# Patient Record
Sex: Male | Born: 1945
Health system: Southern US, Community
[De-identification: ages and names within clinical notes are randomized; demographics above are authoritative.]

## PROBLEM LIST (undated history)

## (undated) DIAGNOSIS — R413 Other amnesia: Secondary | ICD-10-CM

## (undated) DIAGNOSIS — K529 Noninfective gastroenteritis and colitis, unspecified: Secondary | ICD-10-CM

## (undated) DIAGNOSIS — I639 Cerebral infarction, unspecified: Secondary | ICD-10-CM

## (undated) DIAGNOSIS — N183 Chronic kidney disease, stage 3 unspecified: Secondary | ICD-10-CM

## (undated) DIAGNOSIS — E162 Hypoglycemia, unspecified: Secondary | ICD-10-CM

## (undated) DIAGNOSIS — Z8489 Family history of other specified conditions: Secondary | ICD-10-CM

## (undated) DIAGNOSIS — K635 Polyp of colon: Secondary | ICD-10-CM

## (undated) DIAGNOSIS — J189 Pneumonia, unspecified organism: Secondary | ICD-10-CM

## (undated) DIAGNOSIS — N400 Enlarged prostate without lower urinary tract symptoms: Secondary | ICD-10-CM

## (undated) DIAGNOSIS — I5042 Chronic combined systolic (congestive) and diastolic (congestive) heart failure: Secondary | ICD-10-CM

## (undated) DIAGNOSIS — Z8781 Personal history of (healed) traumatic fracture: Secondary | ICD-10-CM

## (undated) DIAGNOSIS — F329 Major depressive disorder, single episode, unspecified: Secondary | ICD-10-CM

## (undated) DIAGNOSIS — A419 Sepsis, unspecified organism: Secondary | ICD-10-CM

## (undated) DIAGNOSIS — M1990 Unspecified osteoarthritis, unspecified site: Secondary | ICD-10-CM

## (undated) DIAGNOSIS — R569 Unspecified convulsions: Secondary | ICD-10-CM

## (undated) DIAGNOSIS — M869 Osteomyelitis, unspecified: Secondary | ICD-10-CM

## (undated) DIAGNOSIS — R112 Nausea with vomiting, unspecified: Secondary | ICD-10-CM

## (undated) DIAGNOSIS — I739 Peripheral vascular disease, unspecified: Secondary | ICD-10-CM

## (undated) DIAGNOSIS — R55 Syncope and collapse: Secondary | ICD-10-CM

## (undated) DIAGNOSIS — D649 Anemia, unspecified: Secondary | ICD-10-CM

## (undated) DIAGNOSIS — I10 Essential (primary) hypertension: Secondary | ICD-10-CM

## (undated) DIAGNOSIS — R269 Unspecified abnormalities of gait and mobility: Secondary | ICD-10-CM

## (undated) DIAGNOSIS — F32A Depression, unspecified: Secondary | ICD-10-CM

## (undated) HISTORY — PX: BELOW KNEE LEG AMPUTATION: SUR23

## (undated) HISTORY — DX: Other amnesia: R41.3

## (undated) HISTORY — PX: EYE SURGERY: SHX253

## (undated) HISTORY — DX: Unspecified abnormalities of gait and mobility: R26.9

## (undated) HISTORY — PX: COLON SURGERY: SHX602

## (undated) HISTORY — DX: Chronic kidney disease, stage 3 unspecified: N18.30

## (undated) HISTORY — PX: TOE AMPUTATION: SHX809

## (undated) HISTORY — DX: Chronic kidney disease, stage 3 (moderate): N18.3

## (undated) HISTORY — DX: Chronic combined systolic (congestive) and diastolic (congestive) heart failure: I50.42

## (undated) HISTORY — PX: VASCULAR SURGERY: SHX849

## (undated) HISTORY — DX: Personal history of (healed) traumatic fracture: Z87.81

## (undated) HISTORY — PX: COLONOSCOPY: SHX174

---

## 1998-08-31 ENCOUNTER — Ambulatory Visit: Admission: RE | Admit: 1998-08-31 | Discharge: 1998-08-31 | Payer: Self-pay | Admitting: Cardiology

## 1998-10-20 ENCOUNTER — Encounter: Admission: RE | Admit: 1998-10-20 | Discharge: 1999-01-18 | Payer: Self-pay | Admitting: Internal Medicine

## 1999-02-01 ENCOUNTER — Encounter: Admission: RE | Admit: 1999-02-01 | Discharge: 1999-05-02 | Payer: Self-pay | Admitting: Internal Medicine

## 2001-06-28 ENCOUNTER — Emergency Department (HOSPITAL_COMMUNITY): Admission: EM | Admit: 2001-06-28 | Discharge: 2001-06-28 | Payer: Self-pay | Admitting: Emergency Medicine

## 2001-06-28 ENCOUNTER — Encounter: Payer: Self-pay | Admitting: Emergency Medicine

## 2001-07-11 ENCOUNTER — Inpatient Hospital Stay (HOSPITAL_COMMUNITY): Admission: AD | Admit: 2001-07-11 | Discharge: 2001-07-30 | Payer: Self-pay

## 2001-07-30 ENCOUNTER — Inpatient Hospital Stay
Admission: AD | Admit: 2001-07-30 | Discharge: 2001-08-14 | Payer: Self-pay | Admitting: Physical Medicine & Rehabilitation

## 2001-08-05 ENCOUNTER — Encounter: Payer: Self-pay | Admitting: Orthopedic Surgery

## 2001-08-11 ENCOUNTER — Encounter: Payer: Self-pay | Admitting: Physical Medicine & Rehabilitation

## 2001-08-13 ENCOUNTER — Encounter: Payer: Self-pay | Admitting: Physical Medicine & Rehabilitation

## 2001-08-14 ENCOUNTER — Inpatient Hospital Stay (HOSPITAL_COMMUNITY)
Admission: AD | Admit: 2001-08-14 | Discharge: 2001-08-19 | Payer: Self-pay | Admitting: Physical Medicine & Rehabilitation

## 2001-08-21 ENCOUNTER — Encounter
Admission: RE | Admit: 2001-08-21 | Discharge: 2001-10-16 | Payer: Self-pay | Admitting: Physical Medicine & Rehabilitation

## 2003-01-22 ENCOUNTER — Ambulatory Visit (HOSPITAL_COMMUNITY): Admission: RE | Admit: 2003-01-22 | Discharge: 2003-01-22 | Payer: Self-pay | Admitting: Orthopedic Surgery

## 2003-01-27 ENCOUNTER — Encounter (INDEPENDENT_AMBULATORY_CARE_PROVIDER_SITE_OTHER): Payer: Self-pay | Admitting: Specialist

## 2003-01-27 ENCOUNTER — Ambulatory Visit (HOSPITAL_COMMUNITY): Admission: RE | Admit: 2003-01-27 | Discharge: 2003-01-28 | Payer: Self-pay | Admitting: Orthopedic Surgery

## 2003-05-01 ENCOUNTER — Ambulatory Visit (HOSPITAL_BASED_OUTPATIENT_CLINIC_OR_DEPARTMENT_OTHER): Admission: RE | Admit: 2003-05-01 | Discharge: 2003-05-01 | Payer: Self-pay | Admitting: Orthopedic Surgery

## 2003-05-01 ENCOUNTER — Encounter (INDEPENDENT_AMBULATORY_CARE_PROVIDER_SITE_OTHER): Payer: Self-pay | Admitting: *Deleted

## 2003-05-01 ENCOUNTER — Ambulatory Visit (HOSPITAL_COMMUNITY): Admission: RE | Admit: 2003-05-01 | Discharge: 2003-05-01 | Payer: Self-pay | Admitting: Orthopedic Surgery

## 2006-06-23 ENCOUNTER — Emergency Department (HOSPITAL_COMMUNITY): Admission: EM | Admit: 2006-06-23 | Discharge: 2006-06-23 | Payer: Self-pay | Admitting: *Deleted

## 2008-05-12 ENCOUNTER — Emergency Department (HOSPITAL_COMMUNITY): Admission: EM | Admit: 2008-05-12 | Discharge: 2008-05-12 | Payer: Self-pay | Admitting: Emergency Medicine

## 2009-01-14 ENCOUNTER — Encounter: Admission: RE | Admit: 2009-01-14 | Discharge: 2009-03-17 | Payer: Self-pay | Admitting: Endocrinology

## 2009-06-30 ENCOUNTER — Emergency Department (HOSPITAL_COMMUNITY): Admission: EM | Admit: 2009-06-30 | Discharge: 2009-06-30 | Payer: Self-pay | Admitting: Emergency Medicine

## 2010-03-15 ENCOUNTER — Encounter (INDEPENDENT_AMBULATORY_CARE_PROVIDER_SITE_OTHER): Payer: Self-pay | Admitting: General Surgery

## 2010-03-15 ENCOUNTER — Inpatient Hospital Stay (HOSPITAL_COMMUNITY)
Admission: RE | Admit: 2010-03-15 | Discharge: 2010-03-19 | Payer: Self-pay | Source: Home / Self Care | Attending: General Surgery | Admitting: General Surgery

## 2010-05-09 NOTE — Discharge Summary (Signed)
  [name], BALLEN NO.:  [id number]  MEDICAL RECORD NO.:  [id number]           PATIENT TYPE:  LOCATION:                                 FACILITY:  PHYSICIAN:  Gwenyth Ober, M.D.    DATE OF BIRTH:  Feb 15, 1946  DATE OF ADMISSION: DATE OF DISCHARGE:                              DISCHARGE SUMMARY   DISCHARGE DIAGNOSES:  Dysplastic polyp of the cecum.  PRINCIPAL PROCEDURE:  Right colectomy.  The patient had additional diagnosis of non-insulin-dependent diabetes mellitus.  He was discharged home in the care of his family.  Discharge medications include pain medicines that were given to him for postoperative recovery and also his preoperative medications including his medications for his diabetes.  His diet is carbohydrate modified.  BRIEF SUMMARY OF HOSPITAL COURSE:  The patient was admitted on the day of procedure for a right segmental colectomy and cecal resection.  This was for dysplastic polyp noted on preoperative colonoscopy. Postoperatively, he did well and was discharged home on postop day #3. He was on the enteric protocol.  He tolerated his diet well, had no significant problems in the hospital.  Staples were still in place at the time of his discharge.  He had a right transverse incision.  He was discharged home in the care of his family.  Pain medicines were administered appropriately and given to the patient.  He is to see Dr. Hulen Skains in about 7-10 days for staple removal.     Gwenyth Ober, M.D.     JOW/MEDQ  D:  04/27/2010  T:  04/28/2010  Job:  [id number]  Electronically Signed by Judeth Horn M.D. on 05/09/2010 12:44:19 PM

## 2010-05-30 LAB — DIFFERENTIAL
Basophils Absolute: 0 10*3/uL (ref 0.0–0.1)
Basophils Relative: 0 % (ref 0–1)
Basophils Relative: 0 % (ref 0–1)
Eosinophils Absolute: 0 10*3/uL (ref 0.0–0.7)
Eosinophils Absolute: 0.1 10*3/uL (ref 0.0–0.7)
Eosinophils Absolute: 0.2 10*3/uL (ref 0.0–0.7)
Eosinophils Relative: 0 % (ref 0–5)
Eosinophils Relative: 1 % (ref 0–5)
Lymphocytes Relative: 10 % — ABNORMAL LOW (ref 12–46)
Lymphocytes Relative: 8 % — ABNORMAL LOW (ref 12–46)
Lymphs Abs: 0.9 10*3/uL (ref 0.7–4.0)
Monocytes Relative: 13 % — ABNORMAL HIGH (ref 3–12)
Neutrophils Relative %: 73 % (ref 43–77)

## 2010-05-30 LAB — COMPREHENSIVE METABOLIC PANEL
ALT: 18 U/L (ref 0–53)
Alkaline Phosphatase: 111 U/L (ref 39–117)
BUN: 44 mg/dL — ABNORMAL HIGH (ref 6–23)
CO2: 29 mEq/L (ref 19–32)
Calcium: 9.6 mg/dL (ref 8.4–10.5)
GFR calc non Af Amer: 39 mL/min — ABNORMAL LOW (ref >60)
Glucose, Bld: 256 mg/dL — ABNORMAL HIGH (ref 70–99)
Potassium: 6 mEq/L — ABNORMAL HIGH (ref 3.5–5.1)
Sodium: 140 mEq/L (ref 135–145)

## 2010-05-30 LAB — GLUCOSE, CAPILLARY
Glucose-Capillary: 109 mg/dL — ABNORMAL HIGH (ref 70–99)
Glucose-Capillary: 109 mg/dL — ABNORMAL HIGH (ref 70–99)
Glucose-Capillary: 127 mg/dL — ABNORMAL HIGH (ref 70–99)
Glucose-Capillary: 149 mg/dL — ABNORMAL HIGH (ref 70–99)
Glucose-Capillary: 161 mg/dL — ABNORMAL HIGH (ref 70–99)
Glucose-Capillary: 190 mg/dL — ABNORMAL HIGH (ref 70–99)
Glucose-Capillary: 246 mg/dL — ABNORMAL HIGH (ref 70–99)
Glucose-Capillary: 248 mg/dL — ABNORMAL HIGH (ref 70–99)
Glucose-Capillary: 257 mg/dL — ABNORMAL HIGH (ref 70–99)
Glucose-Capillary: 282 mg/dL — ABNORMAL HIGH (ref 70–99)

## 2010-05-30 LAB — SURGICAL PCR SCREEN
MRSA, PCR: NEGATIVE
Staphylococcus aureus: NEGATIVE

## 2010-05-30 LAB — BASIC METABOLIC PANEL
BUN: 17 mg/dL (ref 6–23)
BUN: 26 mg/dL — ABNORMAL HIGH (ref 6–23)
CO2: 23 mEq/L (ref 19–32)
CO2: 25 mEq/L (ref 19–32)
Calcium: 9.2 mg/dL (ref 8.4–10.5)
Chloride: 107 mEq/L (ref 96–112)
Chloride: 107 mEq/L (ref 96–112)
Creatinine, Ser: 1.38 mg/dL (ref 0.4–1.5)
Creatinine, Ser: 1.41 mg/dL (ref 0.4–1.5)
GFR calc Af Amer: >60 mL/min (ref >60)
Glucose, Bld: 93 mg/dL (ref 70–99)
Potassium: 4.3 mEq/L (ref 3.5–5.1)
Sodium: 136 mEq/L (ref 135–145)

## 2010-05-30 LAB — CBC
HCT: 33.4 % — ABNORMAL LOW (ref 39.0–52.0)
Hemoglobin: 10.4 g/dL — ABNORMAL LOW (ref 13.0–17.0)
Hemoglobin: 10.8 g/dL — ABNORMAL LOW (ref 13.0–17.0)
MCH: 29 pg (ref 26.0–34.0)
MCH: 29.1 pg (ref 26.0–34.0)
MCH: 29.4 pg (ref 26.0–34.0)
MCHC: 32.3 g/dL (ref 30.0–36.0)
MCHC: 32.5 g/dL (ref 30.0–36.0)
MCV: 89 fL (ref 78.0–100.0)
MCV: 89.3 fL (ref 78.0–100.0)
MCV: 91 fL (ref 78.0–100.0)
Platelets: 194 10*3/uL (ref 150–400)
Platelets: 233 10*3/uL (ref 150–400)
RBC: 3.54 MIL/uL — ABNORMAL LOW (ref 4.22–5.81)
RDW: 12.3 % (ref 11.5–15.5)
RDW: 12.5 % (ref 11.5–15.5)
WBC: 9.3 10*3/uL (ref 4.0–10.5)

## 2010-05-30 LAB — TYPE AND SCREEN: ABO/RH(D): O POS

## 2010-07-02 ENCOUNTER — Emergency Department (HOSPITAL_COMMUNITY): Payer: Medicare Other

## 2010-07-02 ENCOUNTER — Inpatient Hospital Stay (HOSPITAL_COMMUNITY)
Admission: EM | Admit: 2010-07-02 | Discharge: 2010-07-11 | DRG: 617 | Disposition: A | Discharge status: Home Health | Payer: Medicare Other | Source: Ambulatory Visit | Attending: Internal Medicine | Admitting: Internal Medicine

## 2010-07-02 DIAGNOSIS — I129 Hypertensive chronic kidney disease with stage 1 through stage 4 chronic kidney disease, or unspecified chronic kidney disease: Secondary | ICD-10-CM | POA: Diagnosis present

## 2010-07-02 DIAGNOSIS — S98139A Complete traumatic amputation of one unspecified lesser toe, initial encounter: Secondary | ICD-10-CM

## 2010-07-02 DIAGNOSIS — M869 Osteomyelitis, unspecified: Secondary | ICD-10-CM | POA: Diagnosis present

## 2010-07-02 DIAGNOSIS — Z8673 Personal history of transient ischemic attack (TIA), and cerebral infarction without residual deficits: Secondary | ICD-10-CM

## 2010-07-02 DIAGNOSIS — Z79899 Other long term (current) drug therapy: Secondary | ICD-10-CM

## 2010-07-02 DIAGNOSIS — E1069 Type 1 diabetes mellitus with other specified complication: Principal | ICD-10-CM | POA: Diagnosis present

## 2010-07-02 DIAGNOSIS — E1059 Type 1 diabetes mellitus with other circulatory complications: Secondary | ICD-10-CM | POA: Diagnosis present

## 2010-07-02 DIAGNOSIS — E1049 Type 1 diabetes mellitus with other diabetic neurological complication: Secondary | ICD-10-CM | POA: Diagnosis present

## 2010-07-02 DIAGNOSIS — D649 Anemia, unspecified: Secondary | ICD-10-CM | POA: Diagnosis present

## 2010-07-02 DIAGNOSIS — Z794 Long term (current) use of insulin: Secondary | ICD-10-CM

## 2010-07-02 DIAGNOSIS — G40909 Epilepsy, unspecified, not intractable, without status epilepticus: Secondary | ICD-10-CM | POA: Diagnosis present

## 2010-07-02 DIAGNOSIS — E1142 Type 2 diabetes mellitus with diabetic polyneuropathy: Secondary | ICD-10-CM | POA: Diagnosis present

## 2010-07-02 DIAGNOSIS — Z7982 Long term (current) use of aspirin: Secondary | ICD-10-CM

## 2010-07-02 DIAGNOSIS — N179 Acute kidney failure, unspecified: Secondary | ICD-10-CM | POA: Diagnosis present

## 2010-07-02 DIAGNOSIS — M9080 Osteopathy in diseases classified elsewhere, unspecified site: Secondary | ICD-10-CM | POA: Diagnosis present

## 2010-07-02 DIAGNOSIS — L97509 Non-pressure chronic ulcer of other part of unspecified foot with unspecified severity: Secondary | ICD-10-CM | POA: Diagnosis present

## 2010-07-02 DIAGNOSIS — I96 Gangrene, not elsewhere classified: Secondary | ICD-10-CM | POA: Diagnosis present

## 2010-07-02 DIAGNOSIS — Z880 Allergy status to penicillin: Secondary | ICD-10-CM

## 2010-07-02 DIAGNOSIS — N182 Chronic kidney disease, stage 2 (mild): Secondary | ICD-10-CM | POA: Diagnosis present

## 2010-07-02 DIAGNOSIS — E785 Hyperlipidemia, unspecified: Secondary | ICD-10-CM | POA: Diagnosis present

## 2010-07-02 LAB — DIFFERENTIAL
Basophils Absolute: 0 10*3/uL (ref 0.0–0.1)
Basophils Relative: 1 % (ref 0–1)
Lymphocytes Relative: 11 % — ABNORMAL LOW (ref 12–46)
Neutro Abs: 7 10*3/uL (ref 1.7–7.7)
Neutrophils Relative %: 79 % — ABNORMAL HIGH (ref 43–77)

## 2010-07-02 LAB — BASIC METABOLIC PANEL
CO2: 26 mEq/L (ref 19–32)
Calcium: 8.9 mg/dL (ref 8.4–10.5)
Chloride: 109 mEq/L (ref 96–112)
GFR calc Af Amer: 52 mL/min — ABNORMAL LOW (ref >60)
Potassium: 4.7 mEq/L (ref 3.5–5.1)
Sodium: 139 mEq/L (ref 135–145)

## 2010-07-02 LAB — CBC
HCT: 31.4 % — ABNORMAL LOW (ref 39.0–52.0)
Hemoglobin: 10 g/dL — ABNORMAL LOW (ref 13.0–17.0)
MCHC: 31.8 g/dL (ref 30.0–36.0)
RBC: 3.57 MIL/uL — ABNORMAL LOW (ref 4.22–5.81)
WBC: 8.9 10*3/uL (ref 4.0–10.5)

## 2010-07-03 DIAGNOSIS — M629 Disorder of muscle, unspecified: Secondary | ICD-10-CM

## 2010-07-03 LAB — BASIC METABOLIC PANEL
Chloride: 109 mEq/L (ref 96–112)
GFR calc non Af Amer: 43 mL/min — ABNORMAL LOW (ref >60)
Glucose, Bld: 210 mg/dL — ABNORMAL HIGH (ref 70–99)
Potassium: 4.3 mEq/L (ref 3.5–5.1)
Sodium: 139 mEq/L (ref 135–145)

## 2010-07-03 LAB — CBC
HCT: 27.6 % — ABNORMAL LOW (ref 39.0–52.0)
Hemoglobin: 9 g/dL — ABNORMAL LOW (ref 13.0–17.0)
MCH: 28.8 pg (ref 26.0–34.0)
RBC: 3.13 MIL/uL — ABNORMAL LOW (ref 4.22–5.81)

## 2010-07-03 LAB — SEDIMENTATION RATE: Sed Rate: 53 mm/hr — ABNORMAL HIGH (ref 0–16)

## 2010-07-03 LAB — HEMOGLOBIN A1C: Mean Plasma Glucose: 148 mg/dL — ABNORMAL HIGH (ref <117)

## 2010-07-03 LAB — GLUCOSE, CAPILLARY
Glucose-Capillary: 163 mg/dL — ABNORMAL HIGH (ref 70–99)
Glucose-Capillary: 63 mg/dL — ABNORMAL LOW (ref 70–99)

## 2010-07-04 DIAGNOSIS — I739 Peripheral vascular disease, unspecified: Secondary | ICD-10-CM

## 2010-07-04 LAB — CBC
HCT: 29.8 % — ABNORMAL LOW (ref 39.0–52.0)
Hemoglobin: 9.5 g/dL — ABNORMAL LOW (ref 13.0–17.0)
MCH: 27.9 pg (ref 26.0–34.0)
MCHC: 31.9 g/dL (ref 30.0–36.0)
MCV: 87.6 fL (ref 78.0–100.0)
Platelets: 257 K/uL (ref 150–400)
RBC: 3.4 MIL/uL — ABNORMAL LOW (ref 4.22–5.81)
RDW: 13.2 % (ref 11.5–15.5)
WBC: 9 K/uL (ref 4.0–10.5)

## 2010-07-04 LAB — BASIC METABOLIC PANEL WITH GFR
BUN: 26 mg/dL — ABNORMAL HIGH (ref 6–23)
CO2: 22 meq/L (ref 19–32)
Calcium: 8.7 mg/dL (ref 8.4–10.5)
Chloride: 112 meq/L (ref 96–112)
Creatinine, Ser: 1.53 mg/dL — ABNORMAL HIGH (ref 0.4–1.5)
GFR calc non Af Amer: 46 mL/min — ABNORMAL LOW
Glucose, Bld: 66 mg/dL — ABNORMAL LOW (ref 70–99)
Potassium: 4.1 meq/L (ref 3.5–5.1)
Sodium: 142 meq/L (ref 135–145)

## 2010-07-04 LAB — GLUCOSE, CAPILLARY
Glucose-Capillary: 137 mg/dL — ABNORMAL HIGH (ref 70–99)
Glucose-Capillary: 96 mg/dL (ref 70–99)
Glucose-Capillary: 153 mg/dL — ABNORMAL HIGH (ref 70–99)
Glucose-Capillary: 143 mg/dL — ABNORMAL HIGH (ref 70–99)
Glucose-Capillary: 108 mg/dL — ABNORMAL HIGH (ref 70–99)

## 2010-07-05 DIAGNOSIS — I739 Peripheral vascular disease, unspecified: Secondary | ICD-10-CM

## 2010-07-05 LAB — CBC
HCT: 29.5 % — ABNORMAL LOW (ref 39.0–52.0)
Hemoglobin: 9.7 g/dL — ABNORMAL LOW (ref 13.0–17.0)
MCH: 28.7 pg (ref 26.0–34.0)
MCHC: 32.9 g/dL (ref 30.0–36.0)
MCHC: 33.8 g/dL (ref 30.0–36.0)
MCV: 87.3 fL (ref 78.0–100.0)
Platelets: 180 10*3/uL (ref 150–400)
RBC: 3.38 MIL/uL — ABNORMAL LOW (ref 4.22–5.81)
RDW: 12.5 % (ref 11.5–15.5)

## 2010-07-05 LAB — BASIC METABOLIC PANEL
CO2: 21 mEq/L (ref 19–32)
CO2: 23 mEq/L (ref 19–32)
Calcium: 8.9 mg/dL (ref 8.4–10.5)
Calcium: 8.7 mg/dL (ref 8.4–10.5)
Chloride: 111 mEq/L (ref 96–112)
Creatinine, Ser: 1.42 mg/dL (ref 0.4–1.5)
Creatinine, Ser: 1.26 mg/dL (ref 0.4–1.5)
GFR calc Af Amer: >60 mL/min (ref >60)
GFR calc non Af Amer: 58 mL/min — ABNORMAL LOW (ref >60)
Glucose, Bld: 154 mg/dL — ABNORMAL HIGH (ref 70–99)
Glucose, Bld: 119 mg/dL — ABNORMAL HIGH (ref 70–99)

## 2010-07-05 LAB — PHENYTOIN LEVEL, TOTAL: Phenytoin Lvl: 4.6 ug/mL — ABNORMAL LOW (ref 10.0–20.0)

## 2010-07-05 LAB — URINALYSIS, ROUTINE W REFLEX MICROSCOPIC
Glucose, UA: NEGATIVE mg/dL
Leukocytes, UA: NEGATIVE
Protein, ur: 100 mg/dL — AB
Specific Gravity, Urine: 1.016 (ref 1.005–1.030)
Urobilinogen, UA: 0.2 mg/dL (ref 0.0–1.0)

## 2010-07-05 LAB — GLUCOSE, CAPILLARY
Glucose-Capillary: 161 mg/dL — ABNORMAL HIGH (ref 70–99)
Glucose-Capillary: 253 mg/dL — ABNORMAL HIGH (ref 70–99)

## 2010-07-05 LAB — URINE MICROSCOPIC-ADD ON

## 2010-07-06 LAB — GLUCOSE, CAPILLARY: Glucose-Capillary: 145 mg/dL — ABNORMAL HIGH (ref 70–99)

## 2010-07-06 LAB — BASIC METABOLIC PANEL
BUN: 21 mg/dL (ref 6–23)
Calcium: 8.7 mg/dL (ref 8.4–10.5)
Creatinine, Ser: 1.46 mg/dL (ref 0.4–1.5)
GFR calc non Af Amer: 49 mL/min — ABNORMAL LOW (ref >60)
Glucose, Bld: 152 mg/dL — ABNORMAL HIGH (ref 70–99)

## 2010-07-06 NOTE — Op Note (Signed)
  NAMEDEKLAN, ROZELL                 ACCOUNT NO.:  [id number]  MEDICAL RECORD NO.:  [id number]           PATIENT TYPE:  O  LOCATION:  [id number]                         FACILITY:  [facility]  PHYSICIAN:  Theotis Burrow IV, MDDATE OF BIRTH:  1945-08-28  DATE OF PROCEDURE:  07/05/2010 DATE OF DISCHARGE:                              OPERATIVE REPORT   PREOPERATIVE DIAGNOSIS:  Ischemic left toe.  POSTOPERATIVE DIAGNOSIS:  Ischemic left toe.  PROCEDURE PERFORMED: 1. Ultrasound access, right lower extremity. 2. Abdominal aortogram. 3. Left leg runoff. 4. Second order catheterization.  Mr. Vaughns is a 65 year old gentleman who presented to the hospital with dry gangrene and possible osteomyelitis to his left fourth toe.  He has previously undergone toe amputations on both sides which have healed. He has had duplex ultrasound which showed dampened waveforms.  He has a toe pressure of 80.  He comes in today for arteriogram to assess for possible revascularization options.  PROCEDURE:  The patient was identified in the holding area and taken to room #8, placed supine on the table.  The right groin was prepped and draped in the usual fashion.  A time-out was called.  Right femoral artery was accessed under ultrasound guidance with an 18-gauge needle. An 0.035 wire was advanced into the aorta under fluoroscopic visualization.  A 5-French sheath was placed over the wire.  Omni flush catheter was advanced to the level of L1.  Abdominal aortogram was obtained.  Next, using the Omni flush catheter and Glidewire, the aortic bifurcation was crossed.  Catheter was placed in left external iliac artery and left leg runoff was performed.  FINDINGS:  Aortogram:  Visualized portions of the suprarenal abdominal aorta showed no significant disease.  There are single renal arteries which are widely patent bilaterally.  Bilateral common external and internal iliac arteries are widely patent without  stenosis.  Left lower extremity:  The left common femoral artery is widely patent. The left profunda femoral artery is widely patent.  The left superficial femoral artery is widely patent.  The left popliteal artery is widely patent.  The anterior tibial artery is occluded at its origin.  The peroneal artery occludes in the mid calf.  The posterior tibial artery has 2 areas of focal stenosis above the ankle and then at the level of the ankle.  It tapers down to a very diminutive-sized artery.  The patient has severe disease out onto the foot.  There is reconstitution of the dorsalis pedis.  After the above images were obtained, decision was made to terminate the procedure.  Catheters and wires were removed.  The patient was taken to holding area for sheath pull.  IMPRESSION:  Severe tibial disease extending out onto the foot.  The patient has very poor option for revascularization.     Eldridge Abrahams, MD     VWB/MEDQ  D:  07/05/2010  T:  07/05/2010  Job:  [id number]  Electronically Signed by Orvan Falconer IV MD on 07/06/2010 07:39:23 PM

## 2010-07-06 NOTE — Consult Note (Signed)
  NAMEAVIS, [name]                 ACCOUNT NO.:  [id number]  MEDICAL RECORD NO.:  [id number]           PATIENT TYPE:  O  LOCATION:  [id number]                         FACILITY:  [facility]  PHYSICIAN:  Theotis Burrow IV, MDDATE OF BIRTH:  08/28/1945  DATE OF CONSULTATION:  07/03/2010 DATE OF DISCHARGE:                                CONSULTATION   REASON FOR CONSULT:  dry gangreen to the left toe.  HISTORY:  This is a 65 year old gentleman with a history of hypertension, diabetes, hyperlipidemia, and seizure disorder.  He has previously undergone amputation of the third toe and left second toe amputations by Dr. [name] in the past.  He stated that when prior to his admission, he took his sock off and a large piece of skin came off his fourth toe and this started to bleed.  He states he has been dusky for approximately 1 month.  He also complained of swelling in the left foot and leg.  He has not had any drainage or foul smell or erythema.  He denies fevers.  He does not have a palpable pulse.  Therefore, Vascular consult has been requested.   ROS: as stated above and in admission history and physical for full review on July 02, 2010.  PAST MEDICAL HISTORY:  Hypertension, diabetes, diabetic neuropathy, hyperlipidemia, seizure disorder, and TIA.  SOCIAL HISTORY:  Negative for tobacco or alcohol.  ALLERGIES:  Allergic to CODEINE.  PHYSICAL EXAMINATION:  VITAL SIGNS: AFVSS O2 sats 100% on room air. GENERAL:  He is resting comfortably, in no acute distress. HEENT:  Within normal limits.  Sclerae are anicteric.  Naris without drainage. CARDIOVASCULAR:  Regular rate and rhythm.  He has palpable femoral pulses bilaterally, palpable popliteal pulses bilaterally.  I cannot palpate pedal pulses bilaterally.  He does have the anterior tibial and posterior tibial doppler signals. ABDOMEN:  Soft, nontender. NEUROLOGIC:  He is intact. SKIN:  Without rash in the left toe.  There is no evidence  of dry gangrene.  DIAGNOSTIC STUDIES:  Foot x-ray is suggestive of osteomyelitis white blood cell count 6.8.  ASSESSMENT:  Left toe dry gangrene with possible osteomyelitis on x-ray. I spent approximately 30 minutes discussing this area with the patient and his son who is studying to become a Librarian, academic.  The plan is to obtain arterial Dopplers to see if he has evidence of arterial insufficiency. If he has had Doppler evidence suggestive of arterial insufficiency,  We will need to proceed with angiography with possible revascularization.  I detailed  the angiographic procedure and possible stenting with the patient and his son, including the risks and benefits.  All other questions were answered.  Please consult Dr. [name]  for toe amputation.  I would like to get Dopplers in the morning.     Eldridge Abrahams, MD     VWB/MEDQ  D:  07/03/2010  T:  07/04/2010  Job:  [id number]  Electronically Signed by Orvan Falconer IV MD on 07/06/2010 07:44:07 PM

## 2010-07-07 LAB — GLUCOSE, CAPILLARY: Glucose-Capillary: 154 mg/dL — ABNORMAL HIGH (ref 70–99)

## 2010-07-08 ENCOUNTER — Observation Stay (HOSPITAL_COMMUNITY): Payer: Medicare Other

## 2010-07-08 ENCOUNTER — Other Ambulatory Visit: Payer: Self-pay | Admitting: Orthopedic Surgery

## 2010-07-08 LAB — TYPE AND SCREEN
ABO/RH(D): O POS
Antibody Screen: NEGATIVE

## 2010-07-08 LAB — GLUCOSE, CAPILLARY
Glucose-Capillary: 131 mg/dL — ABNORMAL HIGH (ref 70–99)
Glucose-Capillary: 139 mg/dL — ABNORMAL HIGH (ref 70–99)

## 2010-07-09 LAB — GLUCOSE, CAPILLARY
Glucose-Capillary: 120 mg/dL — ABNORMAL HIGH (ref 70–99)
Glucose-Capillary: 124 mg/dL — ABNORMAL HIGH (ref 70–99)
Glucose-Capillary: 160 mg/dL — ABNORMAL HIGH (ref 70–99)
Glucose-Capillary: 164 mg/dL — ABNORMAL HIGH (ref 70–99)
Glucose-Capillary: 71 mg/dL (ref 70–99)

## 2010-07-09 LAB — CBC
Hemoglobin: 8.9 g/dL — ABNORMAL LOW (ref 13.0–17.0)
MCHC: 32.6 g/dL (ref 30.0–36.0)
RBC: 3.17 MIL/uL — ABNORMAL LOW (ref 4.22–5.81)
WBC: 9.3 10*3/uL (ref 4.0–10.5)

## 2010-07-09 LAB — BASIC METABOLIC PANEL
Chloride: 110 mEq/L (ref 96–112)
Creatinine, Ser: 1.37 mg/dL (ref 0.4–1.5)
GFR calc Af Amer: >60 mL/min (ref >60)

## 2010-07-10 ENCOUNTER — Observation Stay (HOSPITAL_COMMUNITY): Payer: Medicare Other

## 2010-07-10 LAB — GLUCOSE, CAPILLARY
Glucose-Capillary: 154 mg/dL — ABNORMAL HIGH (ref 70–99)
Glucose-Capillary: 128 mg/dL — ABNORMAL HIGH (ref 70–99)
Glucose-Capillary: 173 mg/dL — ABNORMAL HIGH (ref 70–99)
Glucose-Capillary: 74 mg/dL (ref 70–99)

## 2010-07-11 LAB — BASIC METABOLIC PANEL
CO2: 21 mEq/L (ref 19–32)
Calcium: 8.4 mg/dL (ref 8.4–10.5)
Creatinine, Ser: 1.66 mg/dL — ABNORMAL HIGH (ref 0.4–1.5)
GFR calc Af Amer: 51 mL/min — ABNORMAL LOW (ref >60)
GFR calc non Af Amer: 42 mL/min — ABNORMAL LOW (ref >60)
Sodium: 137 mEq/L (ref 135–145)

## 2010-07-11 LAB — GLUCOSE, CAPILLARY: Glucose-Capillary: 189 mg/dL — ABNORMAL HIGH (ref 70–99)

## 2010-07-12 NOTE — Op Note (Signed)
  NAMEYOSVANI, ALLARD                 ACCOUNT NO.:  [id number]  MEDICAL RECORD NO.:  [id number]           PATIENT TYPE:  LOCATION:                                 FACILITY:  PHYSICIAN:  Newt Minion, MD     DATE OF BIRTH:  Jul 26, 1945  DATE OF PROCEDURE:  07/08/2010 DATE OF DISCHARGE:                              OPERATIVE REPORT   PREOPERATIVE DIAGNOSIS:  Abscess, osteomyelitis, gangrene, left foot fourth toe.  POSTOPERATIVE DIAGNOSIS:  Abscess, osteomyelitis, gangrene, left foot fourth toe.  PROCEDURE:  Left foot fourth ray amputation.  SURGEON:  Newt Minion, MD  ANESTHESIA:  General.  ESTIMATED BLOOD LOSS:  Minimal.  ANTIBIOTICS:  Vancomycin and Cipro preoperatively.  DRAINS:  None.  COMPLICATIONS:  None.  TOURNIQUET TIME:  None.  DISPOSITION:  To PACU in stable condition.  INDICATIONS FOR PROCEDURE:  The patient is a 65 year old gentleman with diabetic insensate neuropathy who has undergone angiography with an ABI at 54% on the left ankle with no toe pressure.  He is not felt to be a revascularizations candidate by Dr. Trula Slade.  The patient has gangrene of the left foot fourth toe with osteomyelitis, abscess, and drainage. Due to the patient's non-revascularizable condition for the left lower extremity and the gangrenous necrotic changes of the fourth toe, the patient presents at this time for fourth ray amputation.  Risks and benefits were discussed including infection, neurovascular injury, nonhealing of the wound, potential for higher-level amputation.  The patient states he understands and wished to proceed at this time.  DESCRIPTION OF THE PROCEDURE:  The patient was brought to OR room 10 and underwent a general anesthetic after adequate level of anesthesia obtained, the patient's left lower extremity was prepped using DuraPrep and draped into a sterile field.  A racket incision was made dorsally around the great toe.  The metatarsal was transected  through the base of the metatarsal and the metatarsal and toe were resected in one block of tissue.  There was very minimal amount of petechial bleeding.  The wound was irrigated with normal saline.  There was no abscess, no necrotic tissue, no signs of infection.  The incision was closed using 2-0 nylon with modified vertical mattress suture as well as a far-near near-far suture technique.  The wound closed well without any tension on the skin.  The wound was covered with Adaptic, orthopedic sponges, ABD dressing, Kerlix, and Coban.  A compressive wrap was placed from the tibial tubercle to the metatarsal heads due to the patient's venous stasis insufficiency.  The patient was then extubated, taken to PACU in stable condition.  Plan for physical therapy, nonweightbearing on the left.     Newt Minion, MD     MVD/MEDQ  D:  07/08/2010  T:  07/09/2010  Job:  [id number]  Electronically Signed by Meridee Score MD on 07/12/2010 03:02:02 PM

## 2010-07-14 LAB — GLUCOSE, CAPILLARY: Glucose-Capillary: 206 mg/dL — ABNORMAL HIGH (ref 70–99)

## 2010-07-18 NOTE — Discharge Summary (Signed)
[name], EHRHART NO.:  [id number]  MEDICAL RECORD NO.:  [id number]           PATIENT TYPE:  O  LOCATION:  [id number]                         FACILITY:  [facility]  PHYSICIAN:  Edythe Lynn, M.D.       DATE OF BIRTH:  [birth year]  DATE OF ADMISSION:  07/02/2010 DATE OF DISCHARGE:  07/11/2010                              DISCHARGE SUMMARY   PRIMARY CARE PHYSICIAN:  Ronaldo Miyamoto, MD  ORTHOPEDIC SURGEON:  Newt Minion, MD  CONDITION ON DISCHARGE:  Much improved.  The patient is stable on IV antibiotics and is ready for discharge home with a rolling walker and wheelchair.  Has left foot is still nonweightbearing  DISCHARGE DIAGNOSES: 1. Left foot osteomyelitis, status post amputation of the left 4th toe     this hospitalization. 2. Severe peripheral vascular disease.  Poor candidate for     revascularizations per Dr. Trula Slade. 3. Acute on chronic renal failure, acute renal failure now resolved. 4. Anemia. 5. Type 1 diabetes insulin dependent. 6. Hypertension. 7. Seizure disorder. 8. Hyperlipidemia. 9. History of TIA.  HISTORY OF PRESENT ILLNESS:  [name] is very pleasant 65 year old male with history of type 1 diabetes and peripheral vascular disease, who has previously had a 3rd and 5th left toes amputated.  On admission, the patient stated that he was taking off his sock of his left foot yesterday when a large piece of skin came off of his left 4th toe and it started to bleed.  He mentioned that the toe had been black for approximately 1 month.  He also had swelling in his left foot and leg. There is no redness or pus.  No complaint of fever, chills or sweats, who is admitted to the hospital with a chronic necrotic 4th toe edema in his left foot and leg, acute renal failure, type 1 diabetes poorly controlled, history of anemia and seizure disorder.  HOSPITAL COURSE: 1. Severe peripheral vascular disease and osteomyelitis of his left     foot.  The patient  was admitted, started on IV Cipro and     vancomycin.  He was seen by Dr. Annamarie Major on July 03, 2010.     Dr. Trula Slade ordered vascular Dopplers and subsequently     arteriography.  The patient was found to have severe tibial disease     in his left foot and was found to be a poor candidate for     revascularization.  Consequently, on July 08, 2010, he underwent     amputation of his left 4th toe without any complications.  Since     his surgery has been monitored closely by Orthopedics and Physical     Therapy, he is now able to move about with a rolling walker.  It     has been recommended by Physical Therapy that he go home with a     rolling walker and wheelchair as he is nonweightbearing on the left     and fatigues rather quickly.  With regards to his osteomyelitis to     go home with IV access and  12 days of IV vancomycin and     ciprofloxacin along with Home Health to monitor this. 2. DM type 1.  The patient has been maintained on sliding scale     insulin and how his CBGs have ranged from 74-189 as an outpatient.     The patient will resume his previous insulin regimen of 70/30, 15     units in the morning and 10 units with dinner.  His hemoglobin A1c     on July 03, 2010, was 6.8. 3. Anemia.  The patient was admitted with a hemoglobin of 10 after IV     fluids and at dilution his hemoglobin on July 09, 2010, was 8.9.     There was no workup undertaken as to the cause of his anemia.  This     can be done on an outpatient basis. 4. When the patient was admitted, he was felt to be in acute renal     failure.  He had a BUN of 33, creatinine 1.62.  During the course     of his hospital stay, his creatinine did down to 1.37, it was found     that he probably had underlying chronic kidney disease stage II.     Today, on discharge, his BUN is 25, creatinine 1.66.  He will need     follow up as an outpatient to monitor him for chronic kidney     disease.  The patient's other  comorbidities are hypertension, seizure disorder, hyperlipidemia and history of TIA.  Remained stable and quiet in-house.  PROCEDURES: 1. The patient underwent ultrasound to assess his right lower     extremity, abdominal aortogram with left leg runoff, and second     order catheterization on July 05, 2010, by Dr. Annamarie Major.     Results indicated severe left tibial disease extending out onto the     foot.  The patient has very poor options for revascularization. 2. On July 08, 2010, Dr. Meridee Score performed amputation of his left     4th toe.  There were no complications. 3. On July 10, 2010, the patient had a right PICC line placed.  CONSULTATIONS:  The patient was seen in consultation by Dr. Annamarie Major of Vascular Surgery on July 03, 2010.  Further, the patient was seen by Physical Therapy throughout his hospital stay.  RADIOLOGY:  X-ray of the patient's right tibia and fibula on admission showed no acute fracture.  X-ray of the left foot on July 02, 2010, showed chronic changes of the valgus deformity and second toe amputation sclerosis in the proximal phalanx of the 3rd toe could represent osteomyelitis.  On July 08, 2010, the patient had a chest x-ray that showed borderline cardiomegaly without acute disease and on July 10, 2010, the patient had a chest x-ray that showed his PICC line was overlying the mid right atrium and a 2-cm retraction was recommended.  PERTINENT LABS:  On admission, the patient had a white count 8.9, hemoglobin 10.0, hematocrit 31.4, platelets 254,000.  Sodium 139, potassium 4.7, chloride 109, bicarb 26, glucose 191, BUN 33, creatinine 1.62, serum albumin 8.9, hemoglobin A1c 6.8, mean plasma glucose 148. Today, on discharge, the patient's BMET is as follows:  Sodium 137, potassium 4.5, chloride 110, bicarb 21, glucose 158, BUN 25, creatinine 1.66.  The CBC was last done on July 09, 2010, with a WBC of 9.3, hemoglobin 8.9, hematocrit 27.3,  and platelets 281,000.  PHYSICAL EXAMINATION:  GENERAL:  On  discharge, the patient is alert and oriented, in no apparent distress. VITAL SIGNS:  His temperature is 98.9, pulse 61, respirations 20, blood pressure 159/66.  O2 sats 95% on room air. HEENT:  Head is atraumatic normocephalic.  Eyes are anicteric with pupils are equal, round.  Nose shows no nasal discharge or exterior lesions.  Mouth has moist mucous membranes and good dentition. NECK:  Supple with midline trachea.  No JVD.  No lymphadenopathy. CHEST:  Demonstrates no accessory muscle use.  He has no wheezes or crackles to my auscultation. HEART:  Has regular rate and rhythm without obvious murmurs, rubs or gallops. ABDOMEN:  Soft, slightly distended.  Good bowel sounds.  No hepatomegaly.  No tenderness. EXTREMITIES:  Show no clubbing, cyanosis or edema.  His left lower extremity is wrapped and treated.  I did not undo this wrapping in order to examine it. NEURO:  Cranial nerves II-XII are grossly intact.  He has no facial asymmetries.  No obvious focal neuro deficits. PSYCHIATRIC:  The patient is alert and oriented.  Demeanor is pleasant, cooperative.  Grooming is excellent.  DISCHARGE INSTRUCTIONS:  The patient is to minimize weightbearing on his left foot and to utilize his rolling walker and wheelchair, otherwise, he has no restrictions on his activity.  DIET:  He is to resume his diabetic diet and when his leg heals continue exercising preferably swimming.  WOUND CARE:  Per Dr. [name], he is to keep the dressing dry.  He has a follow up appointment with Dr. [name] in 2 weeks.  He is to call the office for the appointment [phone].  He also has a follow up appointment with his primary care physician Dr. Wilson Singer, this is scheduled for Jul 26, 2010, at 1:45 p.m., office number [phone].  The patient will receive home health RN visits to monitor his PICC line and I gave him his IV ciprofloxacin and vancomycin for the next  12 days.  DISCHARGE MEDICATIONS: 1. Ciprofloxacin 400 mg IV b.i.d. 2. Vancomycin 1000 mg IV b.i.d. for 12 days. 3. Aspirin 325 mg 1 tablet daily by mouth. 4. Benazepril/hydrochlorothiazide 20/12.5 1 tablet daily. 5. Dilantin 100 mg 3 capsules by mouth daily at bedtime. 6. Flomax 0.4 mg 1 tablet daily. 7. Humulin 70/30, 10-15 units subcutaneously twice a day. 8. Hydrocodone and acetaminophen 5/325 one to two tablets by mouth     every 4 hours as needed for pain.  The patient was given a     prescription for 20 tablets with no refills. 9. Lipitor 20 mg 1 tablet by mouth every evening. 10.Metoprolol half tablet by mouth twice daily. 11.[name] ophthalmic 0.004% 1 drop in both eyes daily at bedtime. The patient is to stop taking torsemide 20 mg half tablet by mouth twice daily.  He is also to stop taking metformin until he sees his primary care physician who can assess his renal function and determine whether or not these medications should be restarted.  THINGS TO FOLLOW UP ON IN THE OUTPATIENT SETTING: 1. Dr. [name] will handle orthopedic issues, suggestions per primary     care. 2. The patient's metformin and torsemide were recently discontinued,     please assess his renal function and see if     they needs to be restarted. 3. The patient has anemia with a hemoglobin of 8.9 on discharge.     Please proceed with outpatient workup if needed.     Melton Alar, PA   ______________________________ Edythe Lynn, M.D.  MLY/MEDQ  D:  07/11/2010  T:  07/12/2010  Job:  [id number]  cc:   Ronaldo Miyamoto, M.D. Newt Minion, MD  Electronically Signed by Imogene Burn PA on 07/14/2010 02:41:14 PM Electronically Signed by Edythe Lynn M.D. on 07/18/2010 09:13:24 PM

## 2010-07-27 NOTE — H&P (Signed)
NAME:  HUSSAM, HOWDEN NO.:  [id number]  MEDICAL RECORD NO.:  [id number]           PATIENT TYPE:  LOCATION:                                 FACILITY:  PHYSICIAN:  Debbe Odea, M.D.     DATE OF BIRTH:  09-Dec-1945  DATE OF ADMISSION: DATE OF DISCHARGE:                             HISTORY & PHYSICAL   PRIMARY CARE PHYSICIAN:  Ronaldo Miyamoto, MD  PRESENTING COMPLAINT:  Bleeding from toe.  HISTORY OF PRESENT ILLNESS:  This is a 65 year old male with hypertension, diabetes, diabetic neuropathy, hyperlipidemia, and a seizure disorder.  The patient states that he was taking his sock off his left foot yesterday when a large piece of skin came off his left fourth toe and it started to bleed.  He did not notice any pus, the toe has been black for about one month now.  He has had swelling in his left foot and leg.  No redness and again no drainage or pus.  No complaint of fever, chills, or sweats.  The patient has had prior amputation of his toes performed by Dr. [name].  He has never been assessed for vascular disease.  PAST MEDICAL HISTORY: 1. Hypertension. 2. Diabetes mellitus, insulin requiring. 3. Diabetic neuropathy. 4. Hyperlipidemia. 5. Seizure disorder. 6. TIA x1.  SOCIAL HISTORY:  The patient does not smoke or drink.  He is married, lives with his spouse.  He works part-time as a Retail buyer.  PAST SURGICAL HISTORY:  Amputation of one toe on his left foot and two toes on his right foot in the past.  He has also had right cataract surgery.  He recently had a right-sided colectomy for dysplastic polyp.  ALLERGIES:  CODEINE causes swelling.  PENICILLIN "can kill him," he states he does get short of breath and swelling when he takes PENICILLIN.  HOME MEDICATIONS:  Per med list obtained from the patient: 1. Aspirin 325 mg daily. 2. Benicar/hydrochlorothiazide 20/12.5 daily. 3. Diclofenac sodium 75 mg daily. 4. Hydrocodone/acetaminophen 5/325 p.r.n.  He has not  been taking     this. 5. Metformin 500 mg b.i.d. 6. Metoprolol 50 mg. 7. Phenytoin 100 mg. 8. Tamsulosin 0.4 mg daily. 9. Torsemide 20 mg daily. 10.[name]  0.004% one drop in both eyes at bedtime. 11.Not written on his med list but he admitted to when I questioned     him, the patient states that he does get 70/30 insulin 15 units in     the morning, 10 with dinner.  He is supposed to take regular     insulin when his sugar is high.  REVIEW OF SYSTEMS:  GENERAL:  No recent weight loss or weight gain.  No frequent headaches.  HEENT:  No blurred vision, double vision, sore throat, sinus trouble, or earache.  RESPIRATORY:  No shortness of breath or cough.  CARDIAC:  No chest pain or palpitations.  He has pedal edema of the left foot and leg.  GASTROINTESTINAL:  No nausea, vomiting, diarrhea, or constipation.  No reflux.  GENITOURINARY:  No dysuria, hematuria.  HEMATOLOGIC:  No easy bruising.  SKIN:  No rash. MUSCULOSKELETAL:  Mild arthritis in hands.  NEUROLOGIC:  History of TIA and seizures.  PSYCHOLOGIC:  Wife states he has depression.  ENDOCRINE: The patient does not check his sugars regularly but does not complain of increased thirst or increased frequency of micturition.  PHYSICAL EXAMINATION:  GENERAL:  A middle-aged male sitting up in bed in no acute distress. VITAL SIGNS:  Blood pressure 144/66, pulse 72, respiratory rate 18, temperature 98.3, oxygen saturation 96% on room air. HEENT:  Pupils equal, round, reactive to light.  Extraocular movements are intact.  Conjunctivae are pink.  No scleral icterus.  Oral mucosa is dry.  Normal dentition. NECK:  Supple.  No thyromegaly or lymphadenopathy. HEART:  Regular rate and rhythm.  No murmurs, rubs, or gallops. LUNGS:  Clear bilaterally with normal respiratory effort. ABDOMEN:  Soft, nontender, nondistended.  Bowel sounds positive. EXTREMITIES:  No cyanosis or clubbing.  He has 2+ pitting edema on his left foot and leg.  Pedal  pulses palpable on the right dorsalis pedis, but I am unable to palpate a left dorsalis pedis or posterior tibialis. NEUROLOGIC:  Cranial nerves II through XII intact.  Strength intact in all four extremities.  No sensory deficit in his feet. PSYCHOLOGIC:  Awake, alert, oriented x3 with affect normal. SKIN:  Warm, dry.  His left fourth toe is necrotic and distal phalanx is exposed.  There is no current discharge.  BLOOD WORK:  BUN is elevated at 33 degrees with a creatinine of 1.62. All blood work reveals this to be a change from his baseline. Hemoglobin is 10, hematocrit 31.4.  X-ray of his left foot complete reveals chronic changes with hallux valgus deformity and second toe amputation.  There is sclerosis in proximal phalanx of the third toe, nonspecific but could suggest osteomyelitis.  There is also soft tissue swelling.  ASSESSMENT AND PLAN: 1. Necrotic third toe of the left foot with the lack of pedal pulses     and a possibility of osteomyelitis.  I will start him on Unasyn and     will get an ESR, and I will request the Vascular Surgery eval.  We     will need to evaluate his blood flow to the foot prior to getting a     surgical eval for amputation. 2. Edema of his left foot and leg.  We will get an ultrasound to look     for deep venous thrombosis. 3. Acute renal failure.  Hold Benicar/hydrochlorothiazide, metformin,     diclofenac, and Lasix.  I will start him on half-normal saline and     continue this for 1 liter. 4. Diabetes mellitus, insulin requiring.  Resume 70/30, hold metformin     due to elevated creatinine.  Place him on a sliding scale and get a     hemoglobin A1c. 5. Anemia. 6. Seizure disorder.  Deep venous thrombosis prophylaxis with Lovenox.  H and P time on admission was 50 minutes.     Debbe Odea, M.D.     SR/MEDQ  D:  07/02/2010  T:  07/03/2010  Job:  [id number]  cc:   Ronaldo Miyamoto, M.D.  Electronically Signed by Debbe Odea M.D. on  07/27/2010 11:20:32 PM

## 2010-08-05 NOTE — Op Note (Signed)
[name], [name]                             ACCOUNT NO.:  [id number]   MEDICAL RECORD NO.:  [id number]                   PATIENT TYPE:  AMB   LOCATION:  [place]                                  FACILITY:  [facility]   PHYSICIAN:  Newt Minion, M.D.                DATE OF BIRTH:  1945/11/26   DATE OF PROCEDURE:  05/01/2003  DATE OF DISCHARGE:                                 OPERATIVE REPORT   PREOPERATIVE DIAGNOSIS:  Osteomyelitis with abscess and Wagner grade 3  ulceration right third toe.   POSTOPERATIVE DIAGNOSIS:  Osteomyelitis with abscess and Wagner grade 3  ulceration right third toe.   PROCEDURE:  Right third ray amputation.   SURGEON:  Newt Minion, M.D.   ANESTHESIA:  LMA.   ESTIMATED BLOOD LOSS:  Minimal.   ANTIBIOTICS:  Cleocin 600 mg IV.   TOURNIQUET TIME:  None.   DISPOSITION:  To the PACU in stable condition.   INDICATIONS FOR PROCEDURE:  The patient is a 65 year old gentleman with type  2 diabetes status post a right second toe amputation.  He has had  progressive swelling, draining, ulceration, and osteomyelitis of the right  third toe.  The patient has failed conservative care with wound care,  pressure, and p.o. antibiotics, and presents at this time for third ray  amputation.  The risks and benefits were discussed including infection,  neurovascular injury, nonhealing of the incision, need for additional  amputation.  The patient states he understands and wishes to proceed at this  time.   DESCRIPTION OF PROCEDURE:  The patient was brought to OR room 1 and  underwent a general LMA anesthetic.  After an adequate level of anesthesia  was obtained, the patient's right lower extremity was prepped using DuraPrep  and draped in a sterile field.  Using his previous second ray amputation  scar, a V incision was made plantar and dorsal and the third ray was  amputated with the toe and the metatarsal head.  The patient did have a  small amount of purulence around  the MTP joint and cultures were obtained x  2.  The wound was irrigated with normal saline.  There was no abscess or  tracking up the leg.  The wound tissue was good, healthy, and viable with no  necrotic tissue within the wound.  There was good petechial bleeding.  After  irrigation, the wound was closed using 2-0 nylon, the wound was closed  without any tension on the skin.  The wound was covered with Adaptic  orthopedic sponges, sterile Webril, and a Coban dressing.  The patient was  extubated and taken to the PACU in stable condition.  The plan is for  discharge on p.o. Cleocin with follow up in the office in one week.  Newt Minion, M.D.   MVD/MEDQ  D:  05/01/2003  T:  05/01/2003  Job:  [id number]
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
## 2010-08-19 ENCOUNTER — Other Ambulatory Visit: Payer: Self-pay | Admitting: Orthopedic Surgery

## 2010-08-19 ENCOUNTER — Inpatient Hospital Stay (HOSPITAL_COMMUNITY)
Admission: RE | Admit: 2010-08-19 | Discharge: 2010-08-22 | DRG: 240 | Disposition: A | Discharge status: Home Health | Payer: Medicare Other | Source: Ambulatory Visit | Attending: Orthopedic Surgery | Admitting: Orthopedic Surgery

## 2010-08-19 DIAGNOSIS — G609 Hereditary and idiopathic neuropathy, unspecified: Secondary | ICD-10-CM | POA: Diagnosis present

## 2010-08-19 DIAGNOSIS — Z8673 Personal history of transient ischemic attack (TIA), and cerebral infarction without residual deficits: Secondary | ICD-10-CM

## 2010-08-19 DIAGNOSIS — I96 Gangrene, not elsewhere classified: Secondary | ICD-10-CM | POA: Diagnosis present

## 2010-08-19 DIAGNOSIS — S98139A Complete traumatic amputation of one unspecified lesser toe, initial encounter: Secondary | ICD-10-CM

## 2010-08-19 DIAGNOSIS — I10 Essential (primary) hypertension: Secondary | ICD-10-CM | POA: Diagnosis present

## 2010-08-19 DIAGNOSIS — Z880 Allergy status to penicillin: Secondary | ICD-10-CM

## 2010-08-19 DIAGNOSIS — E1159 Type 2 diabetes mellitus with other circulatory complications: Principal | ICD-10-CM | POA: Diagnosis present

## 2010-08-19 LAB — CBC
MCHC: 32.3 g/dL (ref 30.0–36.0)
Platelets: 324 10*3/uL (ref 150–400)
RDW: 14.3 % (ref 11.5–15.5)
WBC: 9.7 10*3/uL (ref 4.0–10.5)

## 2010-08-19 LAB — COMPREHENSIVE METABOLIC PANEL
AST: 15 U/L (ref 0–37)
Albumin: 3 g/dL — ABNORMAL LOW (ref 3.5–5.2)
Alkaline Phosphatase: 141 U/L — ABNORMAL HIGH (ref 39–117)
BUN: 34 mg/dL — ABNORMAL HIGH (ref 6–23)
CO2: 26 mEq/L (ref 19–32)
Chloride: 102 mEq/L (ref 96–112)
Creatinine, Ser: 1.71 mg/dL — ABNORMAL HIGH (ref 0.4–1.5)
GFR calc non Af Amer: 41 mL/min — ABNORMAL LOW (ref >60)
Potassium: 4.7 mEq/L (ref 3.5–5.1)
Total Bilirubin: 0.2 mg/dL — ABNORMAL LOW (ref 0.3–1.2)

## 2010-08-19 LAB — APTT: aPTT: 31 seconds (ref 24–37)

## 2010-08-19 LAB — GLUCOSE, CAPILLARY: Glucose-Capillary: 144 mg/dL — ABNORMAL HIGH (ref 70–99)

## 2010-08-19 LAB — PROTIME-INR
INR: 1.09 (ref 0.00–1.49)
Prothrombin Time: 14.3 seconds (ref 11.6–15.2)

## 2010-08-19 LAB — SURGICAL PCR SCREEN: MRSA, PCR: NEGATIVE

## 2010-08-20 LAB — GLUCOSE, CAPILLARY
Glucose-Capillary: 350 mg/dL — ABNORMAL HIGH (ref 70–99)
Glucose-Capillary: 202 mg/dL — ABNORMAL HIGH (ref 70–99)

## 2010-08-21 LAB — GLUCOSE, CAPILLARY
Glucose-Capillary: 143 mg/dL — ABNORMAL HIGH (ref 70–99)
Glucose-Capillary: 87 mg/dL (ref 70–99)

## 2010-08-22 LAB — GLUCOSE, CAPILLARY

## 2010-08-31 NOTE — Discharge Summary (Signed)
  [name], [name]                 ACCOUNT NO.:  [id number]  MEDICAL RECORD NO.:  [id number]           PATIENT TYPE:  I  LOCATION:  [id number]                         FACILITY:  [facility]  PHYSICIAN:  Newt Minion, MD     DATE OF BIRTH:  1945/05/21  DATE OF ADMISSION:  08/19/2010 DATE OF DISCHARGE:  08/22/2010                              DISCHARGE SUMMARY   FINAL DIAGNOSIS:  Gangrene, left foot.  SURGICAL PROCEDURES:  Left transtibial amputation.  Discharged to home in stable condition.  Prescriptions for Vicodin, Tylox, and Robaxin.  [facility].  Follow up in the office in 2 weeks.  Nonweightbearing, left lower extremity.  The patient has durable medical equipment at home.     Newt Minion, MD     MVD/MEDQ  D:  08/22/2010  T:  08/22/2010  Job:  [id number]  Electronically Signed by Meridee Score MD on 08/31/2010 06:55:06 AM

## 2010-08-31 NOTE — Op Note (Signed)
  [name], [name]                 ACCOUNT NO.:  [id number]  MEDICAL RECORD NO.:  [id number]           PATIENT TYPE:  I  LOCATION:  [id number]                         FACILITY:  [facility]  PHYSICIAN:  Newt Minion, MD     DATE OF BIRTH:  [birth year]  DATE OF PROCEDURE:  08/19/2010 DATE OF DISCHARGE:                              OPERATIVE REPORT   PREOPERATIVE DIAGNOSIS:  Gangrene, left foot.  POSTOPERATIVE DIAGNOSIS:  Gangrene, left foot.  PROCEDURE:  Left transtibial amputation.  SURGEON:  Newt Minion, MD  ASSISTANT:  None.  ANESTHESIA:  General.  ESTIMATED BLOOD LOSS:  Minimal.  ANTIBIOTICS:  Clindamycin 600 mg IV.  DRAINS:  None.  COMPLICATIONS:  None.  TOURNIQUET TIME:  6 minutes at 300 mmHg.  SPECIMEN:  Sent to Pathology.  DISPOSITION:  To PACU in stable condition.  INDICATION FOR PROCEDURE:  The patient is a 65 year old gentleman with type 2 diabetes with failed foot salvage surgery with gangrenous changes to the left foot who presents at this time for left transtibial amputation.  Risks and benefits were discussed including infection, neurovascular injury, nonhealing of the wound, and need for additional surgery.  The patient states he understands and wished to proceed at this time.  DESCRIPTION OF PROCEDURE:  The patient was brought to OR room #15 and underwent a general anesthetic.  After adequate level of anesthesia was obtained, the patient's left lower extremity was prepped using DuraPrep and draped into a sterile field.  A transverse incision was made 11 cm distal to the tibial tubercle.  This curved proximally and the large posterior flap was created.  The tibia was transected 1 cm proximal to the skin incision.  This was beveled anteriorly and the fibula was transected 1 cm proximal to the tibial incision.  A large posterior flap was created with amputation knife.  The vascular bundles were suture ligated x2 each with 2-0 silk.  The sciatic nerve was  pulled, cut, allowed to retract.  The Acell graft was then fashioned over the end of the tibia and fibula and then secured with #1 PDS.  The deep and superficial fascial layers were closed using #1 PDS.  The skin was closed using approximating staples and 2-0 nylon.  The wound was then covered with the Acell powder, Adaptic, 4 x 4's, ABD, Kerlix, and Coban.  The patient was extubated and taken to PACU in stable condition.     Newt Minion, MD     MVD/MEDQ  D:  08/19/2010  T:  08/20/2010  Job:  [id number]  Electronically Signed by Meridee Score MD on 08/31/2010 06:55:02 AM

## 2010-09-26 ENCOUNTER — Other Ambulatory Visit: Payer: Self-pay | Admitting: Ophthalmology

## 2010-09-26 DIAGNOSIS — H469 Unspecified optic neuritis: Secondary | ICD-10-CM

## 2010-09-30 ENCOUNTER — Ambulatory Visit
Admission: RE | Admit: 2010-09-30 | Discharge: 2010-09-30 | Disposition: A | Discharge status: Home / Self Care | Payer: Medicare Other | Source: Ambulatory Visit | Attending: Ophthalmology | Admitting: Ophthalmology

## 2010-09-30 DIAGNOSIS — H469 Unspecified optic neuritis: Secondary | ICD-10-CM

## 2010-09-30 MED ORDER — GADOBENATE DIMEGLUMINE 529 MG/ML IV SOLN
17.0000 mL | Freq: Once | INTRAVENOUS | Status: AC | PRN
  Administered 2010-09-30: 17 mL via INTRAVENOUS

## 2011-01-10 ENCOUNTER — Ambulatory Visit: Payer: Medicare Other | Attending: Orthopedic Surgery | Admitting: Physical Therapy

## 2011-01-10 DIAGNOSIS — S88119A Complete traumatic amputation at level between knee and ankle, unspecified lower leg, initial encounter: Secondary | ICD-10-CM | POA: Insufficient documentation

## 2011-01-10 DIAGNOSIS — IMO0001 Reserved for inherently not codable concepts without codable children: Secondary | ICD-10-CM | POA: Insufficient documentation

## 2011-01-10 DIAGNOSIS — R5381 Other malaise: Secondary | ICD-10-CM | POA: Insufficient documentation

## 2011-01-10 DIAGNOSIS — R269 Unspecified abnormalities of gait and mobility: Secondary | ICD-10-CM | POA: Insufficient documentation

## 2011-01-11 ENCOUNTER — Ambulatory Visit: Payer: Medicare Other | Admitting: Physical Therapy

## 2011-01-16 ENCOUNTER — Ambulatory Visit: Payer: Medicare Other | Admitting: Physical Therapy

## 2011-01-19 ENCOUNTER — Ambulatory Visit: Payer: Medicare Other | Attending: Orthopedic Surgery | Admitting: Physical Therapy

## 2011-01-19 DIAGNOSIS — R269 Unspecified abnormalities of gait and mobility: Secondary | ICD-10-CM | POA: Insufficient documentation

## 2011-01-19 DIAGNOSIS — R5381 Other malaise: Secondary | ICD-10-CM | POA: Insufficient documentation

## 2011-01-19 DIAGNOSIS — IMO0001 Reserved for inherently not codable concepts without codable children: Secondary | ICD-10-CM | POA: Insufficient documentation

## 2011-01-19 DIAGNOSIS — S88119A Complete traumatic amputation at level between knee and ankle, unspecified lower leg, initial encounter: Secondary | ICD-10-CM | POA: Insufficient documentation

## 2011-01-24 ENCOUNTER — Ambulatory Visit: Payer: Medicare Other | Admitting: Physical Therapy

## 2011-01-26 ENCOUNTER — Ambulatory Visit: Payer: Medicare Other | Admitting: Physical Therapy

## 2011-01-30 ENCOUNTER — Ambulatory Visit: Payer: Medicare Other | Admitting: Physical Therapy

## 2011-02-01 ENCOUNTER — Ambulatory Visit: Payer: Medicare Other | Admitting: Physical Therapy

## 2011-02-06 ENCOUNTER — Ambulatory Visit: Payer: Medicare Other | Admitting: Physical Therapy

## 2011-02-08 ENCOUNTER — Ambulatory Visit: Payer: Medicare Other | Admitting: Physical Therapy

## 2011-02-14 ENCOUNTER — Ambulatory Visit: Payer: Medicare Other | Admitting: Physical Therapy

## 2011-02-16 ENCOUNTER — Ambulatory Visit: Payer: Medicare Other | Admitting: Physical Therapy

## 2011-02-21 ENCOUNTER — Ambulatory Visit: Payer: Medicare Other | Attending: Orthopedic Surgery | Admitting: Physical Therapy

## 2011-02-21 DIAGNOSIS — R269 Unspecified abnormalities of gait and mobility: Secondary | ICD-10-CM | POA: Insufficient documentation

## 2011-02-21 DIAGNOSIS — R5381 Other malaise: Secondary | ICD-10-CM | POA: Insufficient documentation

## 2011-02-21 DIAGNOSIS — S88119A Complete traumatic amputation at level between knee and ankle, unspecified lower leg, initial encounter: Secondary | ICD-10-CM | POA: Insufficient documentation

## 2011-02-21 DIAGNOSIS — IMO0001 Reserved for inherently not codable concepts without codable children: Secondary | ICD-10-CM | POA: Insufficient documentation

## 2011-02-23 ENCOUNTER — Ambulatory Visit: Payer: Medicare Other | Admitting: Physical Therapy

## 2011-02-28 ENCOUNTER — Ambulatory Visit: Payer: Medicare Other | Admitting: Physical Therapy

## 2011-03-02 ENCOUNTER — Ambulatory Visit: Payer: Medicare Other | Admitting: Physical Therapy

## 2011-03-07 ENCOUNTER — Ambulatory Visit: Payer: Medicare Other | Admitting: Physical Therapy

## 2011-03-09 ENCOUNTER — Ambulatory Visit: Payer: Medicare Other | Admitting: Physical Therapy

## 2011-10-05 ENCOUNTER — Encounter (HOSPITAL_COMMUNITY): Payer: Self-pay | Admitting: Pharmacy Technician

## 2011-10-06 ENCOUNTER — Other Ambulatory Visit (HOSPITAL_COMMUNITY): Payer: Self-pay | Admitting: Orthopedic Surgery

## 2011-10-07 NOTE — Pre-Procedure Instructions (Addendum)
20 [name].  10/07/2011   Your procedure is scheduled on:  Tues,July 23 @ 9:05 AM  Report to [facility] at 7:00 AM.  Call this number if you have problems the morning of surgery: [phone]   Remember:   Do not eat food:After Midnight.     Take these medicines the morning of surgery with A SIP OF WATER: Metoprolol(Lopressor) and Eye Drops   Do not wear jewelry  Do not wear lotions, powders, or cologne  Men may shave face and neck.  Do not bring valuables to the hospital.  Contacts, dentures or bridgework may not be worn into surgery.  Leave suitcase in the car. After surgery it may be brought to your room.  For patients admitted to the hospital, checkout time is 11:00 AM the day of discharge.   Patients discharged the day of surgery will not be allowed to drive home.  Special Instructions: Incentive Spirometry - Practice and bring it with you on the day of surgery. and CHG Shower Use Special Wash: 1/2 bottle night before surgery and 1/2 bottle morning of surgery.   Please read over the following fact sheets that you were given: Pain Booklet, Coughing and Deep Breathing, MRSA Information and Surgical Site Infection Prevention

## 2011-10-09 ENCOUNTER — Encounter (HOSPITAL_COMMUNITY)
Admission: RE | Admit: 2011-10-09 | Discharge: 2011-10-09 | Disposition: A | Discharge status: Home / Self Care | Payer: Medicare Other | Source: Ambulatory Visit | Attending: Orthopedic Surgery | Admitting: Orthopedic Surgery

## 2011-10-09 ENCOUNTER — Encounter (HOSPITAL_COMMUNITY): Payer: Self-pay | Admitting: Vascular Surgery

## 2011-10-09 ENCOUNTER — Encounter (HOSPITAL_COMMUNITY): Payer: Self-pay

## 2011-10-09 HISTORY — DX: Cerebral infarction, unspecified: I63.9

## 2011-10-09 HISTORY — DX: Essential (primary) hypertension: I10

## 2011-10-09 HISTORY — DX: Unspecified convulsions: R56.9

## 2011-10-09 HISTORY — DX: Peripheral vascular disease, unspecified: I73.9

## 2011-10-09 LAB — CBC
MCH: 28.5 pg (ref 26.0–34.0)
MCHC: 32.9 g/dL (ref 30.0–36.0)
Platelets: 263 10*3/uL (ref 150–400)

## 2011-10-09 LAB — COMPREHENSIVE METABOLIC PANEL
ALT: 10 U/L (ref 0–53)
AST: 14 U/L (ref 0–37)
Calcium: 9.4 mg/dL (ref 8.4–10.5)
GFR calc Af Amer: 43 mL/min — ABNORMAL LOW (ref >90)
Sodium: 135 mEq/L (ref 135–145)
Total Protein: 7.2 g/dL (ref 6.0–8.3)

## 2011-10-09 LAB — SURGICAL PCR SCREEN
MRSA, PCR: NEGATIVE
Staphylococcus aureus: POSITIVE — AB

## 2011-10-09 MED ORDER — CLINDAMYCIN PHOSPHATE 900 MG/50ML IV SOLN
900.0000 mg | INTRAVENOUS | Status: AC
  Administered 2011-10-10: 900 mg via INTRAVENOUS
  Filled 2011-10-09: qty 50

## 2011-10-09 NOTE — Progress Notes (Addendum)
Requested CXR from Dr. Wilson Singer.  @1112  CXR received, was from 12/01/09, will need DOS.  Pt stated his PCP has referred him for kidney eval, however he has not gotten an official diagnosis.

## 2011-10-09 NOTE — Consult Note (Signed)
Anesthesia Chart Review:  Patient is a 66 year old male scheduled for right fourth toe amputation by Dr. [name] on 10/10/11.  His PAT visit was on 10/09/11.  History includes DM on insulin, CKD Stage II (per 06/2010 notes), non-smoker, HTN, PVD s/p left BKA 08/2010, CVA, partial colectomy for dysplastic polyp '11, seizures on Dilantin.  PCP is Dr. Wilson Singer with [facility].    EKG from 10/09/11 showed NSR.  Labs noted.  Cr 1.85, BUN 49, glucose 378, H/H 9.6/29.2.  PLT and coags WNL.  His BUN/Cr from 08/19/10 were 34/1.71 and H/H were 9.7/30.0.   His Hgb has been ~ 9-11 since December 2011.  His Cr went from 1.7-1.8 since last year, his H/H are overall stable.  Will not plan to repeat labs pre-operatively other then a CBG.  He is for a CXR on the day of surgery.    Myra Gianotti, PA-C 10/09/11 1425

## 2011-10-10 ENCOUNTER — Encounter (HOSPITAL_COMMUNITY)
Admission: RE | Disposition: A | Discharge status: Home / Self Care | Payer: Self-pay | Source: Ambulatory Visit | Attending: Orthopedic Surgery

## 2011-10-10 ENCOUNTER — Ambulatory Visit (HOSPITAL_COMMUNITY)
Admission: RE | Admit: 2011-10-10 | Discharge: 2011-10-10 | Disposition: A | Discharge status: Home / Self Care | Payer: Medicare Other | Source: Ambulatory Visit | Attending: Orthopedic Surgery | Admitting: Orthopedic Surgery

## 2011-10-10 ENCOUNTER — Encounter (HOSPITAL_COMMUNITY): Payer: Self-pay | Admitting: Certified Registered"

## 2011-10-10 ENCOUNTER — Ambulatory Visit (HOSPITAL_COMMUNITY): Payer: Medicare Other | Admitting: Vascular Surgery

## 2011-10-10 ENCOUNTER — Encounter (HOSPITAL_COMMUNITY): Payer: Self-pay | Admitting: Vascular Surgery

## 2011-10-10 ENCOUNTER — Ambulatory Visit (HOSPITAL_COMMUNITY): Payer: Medicare Other

## 2011-10-10 DIAGNOSIS — I129 Hypertensive chronic kidney disease with stage 1 through stage 4 chronic kidney disease, or unspecified chronic kidney disease: Secondary | ICD-10-CM | POA: Insufficient documentation

## 2011-10-10 DIAGNOSIS — E1142 Type 2 diabetes mellitus with diabetic polyneuropathy: Secondary | ICD-10-CM | POA: Insufficient documentation

## 2011-10-10 DIAGNOSIS — M869 Osteomyelitis, unspecified: Secondary | ICD-10-CM | POA: Insufficient documentation

## 2011-10-10 DIAGNOSIS — E1169 Type 2 diabetes mellitus with other specified complication: Secondary | ICD-10-CM | POA: Insufficient documentation

## 2011-10-10 DIAGNOSIS — Z01812 Encounter for preprocedural laboratory examination: Secondary | ICD-10-CM | POA: Insufficient documentation

## 2011-10-10 DIAGNOSIS — N182 Chronic kidney disease, stage 2 (mild): Secondary | ICD-10-CM | POA: Insufficient documentation

## 2011-10-10 DIAGNOSIS — Z01811 Encounter for preprocedural respiratory examination: Secondary | ICD-10-CM | POA: Insufficient documentation

## 2011-10-10 DIAGNOSIS — Z01810 Encounter for preprocedural cardiovascular examination: Secondary | ICD-10-CM | POA: Insufficient documentation

## 2011-10-10 DIAGNOSIS — M9080 Osteopathy in diseases classified elsewhere, unspecified site: Secondary | ICD-10-CM | POA: Insufficient documentation

## 2011-10-10 DIAGNOSIS — E1149 Type 2 diabetes mellitus with other diabetic neurological complication: Secondary | ICD-10-CM | POA: Insufficient documentation

## 2011-10-10 HISTORY — PX: AMPUTATION: SHX166

## 2011-10-10 LAB — GLUCOSE, CAPILLARY
Glucose-Capillary: 392 mg/dL — ABNORMAL HIGH (ref 70–99)
Glucose-Capillary: 198 mg/dL — ABNORMAL HIGH (ref 70–99)

## 2011-10-10 SURGERY — AMPUTATION DIGIT
Anesthesia: General | Site: Foot | Laterality: Right | Wound class: Dirty or Infected

## 2011-10-10 MED ORDER — PROPOFOL 10 MG/ML IV EMUL
INTRAVENOUS | Status: DC | PRN
  Administered 2011-10-10: 240 mg via INTRAVENOUS

## 2011-10-10 MED ORDER — PHENYLEPHRINE HCL 10 MG/ML IJ SOLN
INTRAMUSCULAR | Status: DC | PRN
  Administered 2011-10-10 (×3): 40 ug via INTRAVENOUS

## 2011-10-10 MED ORDER — LACTATED RINGERS IV SOLN
INTRAVENOUS | Status: DC
  Administered 2011-10-10: 08:00:00 via INTRAVENOUS

## 2011-10-10 MED ORDER — 0.9 % SODIUM CHLORIDE (POUR BTL) OPTIME
TOPICAL | Status: DC | PRN
  Administered 2011-10-10: 1000 mL

## 2011-10-10 MED ORDER — FENTANYL CITRATE 0.05 MG/ML IJ SOLN: 25.0000 ug | INTRAMUSCULAR | Status: DC | PRN

## 2011-10-10 MED ORDER — LIDOCAINE HCL (CARDIAC) 20 MG/ML IV SOLN
INTRAVENOUS | Status: DC | PRN
  Administered 2011-10-10: 60 mg via INTRAVENOUS

## 2011-10-10 MED ORDER — INSULIN ASPART 100 UNIT/ML ~~LOC~~ SOLN
SUBCUTANEOUS | Status: AC
  Filled 2011-10-10: qty 1

## 2011-10-10 MED ORDER — FENTANYL CITRATE 0.05 MG/ML IJ SOLN
INTRAMUSCULAR | Status: DC | PRN
  Administered 2011-10-10: 100 ug via INTRAVENOUS

## 2011-10-10 MED ORDER — LACTATED RINGERS IV SOLN
INTRAVENOUS | Status: DC | PRN
  Administered 2011-10-10: 10:00:00 via INTRAVENOUS

## 2011-10-10 MED ORDER — ONDANSETRON HCL 4 MG/2ML IJ SOLN: 4.0000 mg | Freq: Four times a day (QID) | INTRAMUSCULAR | Status: DC | PRN

## 2011-10-10 MED ORDER — INSULIN ASPART 100 UNIT/ML ~~LOC~~ SOLN
12.0000 [IU] | Freq: Once | SUBCUTANEOUS | Status: AC
  Administered 2011-10-10: 12 [IU] via INTRAVENOUS

## 2011-10-10 MED ORDER — EPHEDRINE SULFATE 50 MG/ML IJ SOLN
INTRAMUSCULAR | Status: DC | PRN
  Administered 2011-10-10: 5 mg via INTRAVENOUS

## 2011-10-10 MED ORDER — MUPIROCIN 2 % EX OINT
TOPICAL_OINTMENT | Freq: Two times a day (BID) | CUTANEOUS | Status: DC
  Administered 2011-10-10: 1 via NASAL
  Filled 2011-10-10: qty 22

## 2011-10-10 MED ORDER — HYDROCODONE-ACETAMINOPHEN 5-500 MG PO TABS: 1.0000 | ORAL_TABLET | Freq: Four times a day (QID) | ORAL | Status: AC | PRN

## 2011-10-10 MED ORDER — MUPIROCIN 2 % EX OINT
TOPICAL_OINTMENT | CUTANEOUS | Status: AC
  Filled 2011-10-10: qty 22

## 2011-10-10 SURGICAL SUPPLY — 50 items
BANDAGE GAUZE 4  KLING STR (GAUZE/BANDAGES/DRESSINGS) IMPLANT
BANDAGE GAUZE ELAST BULKY 4 IN (GAUZE/BANDAGES/DRESSINGS) ×2 IMPLANT
BLADE AVERAGE 25X9 (BLADE) IMPLANT
BLADE MINI RND TIP GREEN BEAV (BLADE) IMPLANT
BNDG CMPR 9X4 STRL LF SNTH (GAUZE/BANDAGES/DRESSINGS) ×1
BNDG COHESIVE 1X5 TAN STRL LF (GAUZE/BANDAGES/DRESSINGS) IMPLANT
BNDG COHESIVE 6X5 TAN STRL LF (GAUZE/BANDAGES/DRESSINGS) ×2 IMPLANT
BNDG ESMARK 4X9 LF (GAUZE/BANDAGES/DRESSINGS) ×2 IMPLANT
BNDG GAUZE STRTCH 6 (GAUZE/BANDAGES/DRESSINGS) IMPLANT
CLOTH BEACON ORANGE TIMEOUT ST (SAFETY) ×2 IMPLANT
CORDS BIPOLAR (ELECTRODE) ×2 IMPLANT
COVER SURGICAL LIGHT HANDLE (MISCELLANEOUS) ×2 IMPLANT
CUFF TOURNIQUET SINGLE 18IN (TOURNIQUET CUFF) IMPLANT
CUFF TOURNIQUET SINGLE 24IN (TOURNIQUET CUFF) IMPLANT
CUFF TOURNIQUET SINGLE 34IN LL (TOURNIQUET CUFF) IMPLANT
CUFF TOURNIQUET SINGLE 44IN (TOURNIQUET CUFF) IMPLANT
DRAPE U-SHAPE 47X51 STRL (DRAPES) ×2 IMPLANT
DRSG ADAPTIC 3X8 NADH LF (GAUZE/BANDAGES/DRESSINGS) IMPLANT
DRSG EMULSION OIL 3X3 NADH (GAUZE/BANDAGES/DRESSINGS) ×2 IMPLANT
DRSG PAD ABDOMINAL 8X10 ST (GAUZE/BANDAGES/DRESSINGS) ×2 IMPLANT
DURAPREP 26ML APPLICATOR (WOUND CARE) ×2 IMPLANT
ELECT REM PT RETURN 9FT ADLT (ELECTROSURGICAL) ×2
ELECTRODE REM PT RTRN 9FT ADLT (ELECTROSURGICAL) ×1 IMPLANT
GAUZE SPONGE 2X2 8PLY STRL LF (GAUZE/BANDAGES/DRESSINGS) IMPLANT
GLOVE BIOGEL PI IND STRL 9 (GLOVE) ×1 IMPLANT
GLOVE BIOGEL PI INDICATOR 9 (GLOVE) ×1
GLOVE SURG ORTHO 9.0 STRL STRW (GLOVE) ×2 IMPLANT
GOWN PREVENTION PLUS XLARGE (GOWN DISPOSABLE) ×2 IMPLANT
GOWN SRG XL XLNG 56XLVL 4 (GOWN DISPOSABLE) ×1 IMPLANT
GOWN STRL NON-REIN XL XLG LVL4 (GOWN DISPOSABLE) ×2
KIT BASIN OR (CUSTOM PROCEDURE TRAY) ×2 IMPLANT
KIT ROOM TURNOVER OR (KITS) ×2 IMPLANT
MANIFOLD NEPTUNE II (INSTRUMENTS) IMPLANT
NEEDLE HYPO 25GX1X1/2 BEV (NEEDLE) IMPLANT
NS IRRIG 1000ML POUR BTL (IV SOLUTION) ×2 IMPLANT
PACK ORTHO EXTREMITY (CUSTOM PROCEDURE TRAY) ×2 IMPLANT
PAD ARMBOARD 7.5X6 YLW CONV (MISCELLANEOUS) ×4 IMPLANT
PAD CAST 4YDX4 CTTN HI CHSV (CAST SUPPLIES) IMPLANT
PADDING CAST COTTON 4X4 STRL (CAST SUPPLIES)
SPECIMEN JAR SMALL (MISCELLANEOUS) IMPLANT
SPONGE GAUZE 2X2 STER 10/PKG (GAUZE/BANDAGES/DRESSINGS)
SPONGE GAUZE 4X4 12PLY (GAUZE/BANDAGES/DRESSINGS) ×2 IMPLANT
SUCTION FRAZIER TIP 10 FR DISP (SUCTIONS) IMPLANT
SUT ETHILON 2 0 PSLX (SUTURE) IMPLANT
SUT VIC AB 2-0 FS1 27 (SUTURE) IMPLANT
SYR CONTROL 10ML LL (SYRINGE) IMPLANT
TOWEL OR 17X24 6PK STRL BLUE (TOWEL DISPOSABLE) ×2 IMPLANT
TOWEL OR 17X26 10 PK STRL BLUE (TOWEL DISPOSABLE) ×2 IMPLANT
TUBE CONNECTING 12X1/4 (SUCTIONS) ×2 IMPLANT
WATER STERILE IRR 1000ML POUR (IV SOLUTION) IMPLANT

## 2011-10-10 NOTE — Op Note (Signed)
OPERATIVE REPORT  DATE OF SURGERY: 10/10/2011  PATIENT:  Devin Rakers.,  66 y.o. male  PRE-OPERATIVE DIAGNOSIS:  Osteomyelitis Right Foot 4th Toe  POST-OPERATIVE DIAGNOSIS:  Osteomyelitis Right Foot 4th Toe  PROCEDURE:  Procedure(s): AMPUTATION DIGIT Amputation fourth ray right foot  SURGEON:  Surgeon(s): Newt Minion, MD  ANESTHESIA:   general  EBL:  Minimal ML  SPECIMEN:  No Specimen  TOURNIQUET:  * No tourniquets in log *  PROCEDURE DETAILS: Patient is a 66 year old gentleman with diabetic neuropathy status post amputations of the second and third toes of his right foot who presents at this time with osteomyelitis ulceration of the fourth toe and presents for amputation of the fourth toe. Risks and benefits of surgery were discussed including infection neurovascular injury nonhealing of the wound need for additional surgery. Patient states he understands and wished to proceed at this time. Description of procedure patient brought to or and underwent a general anesthetic. After adequate levels of anesthesia were obtained patient's right lower extremity was prepped using DuraPrep and draped into a sterile field. A fishmouth incision was made and the toe was amputated through the MTP joint. The dorsal flap was ischemic and had of nonviable tissue. Decision was then made to ellipse out this nonviable tissue and a ray amputation was performed to allow for the wound to close. There was good bleeding petechial tissue along the wound edges. The wound was irrigated with normal saline the wound was closed using 2-0 nylon there was no tension the skin. The wound was covered with Adaptic orthopedic sponges ABDs dressing Kerlix and Coban. Patient was extubated taken to the PACU in stable condition plan for discharge to home.  PLAN OF CARE: Discharge to home after PACU  PATIENT DISPOSITION:  PACU - hemodynamically stable.   Newt Minion, MD 10/10/2011 10:43 AM

## 2011-10-10 NOTE — Anesthesia Procedure Notes (Signed)
Procedure Name: Intubation Date/Time: 10/10/2011 10:17 AM Performed by: Maeola Harman Pre-anesthesia Checklist: Patient identified, Emergency Drugs available, Suction available, Patient being monitored and Timeout performed Patient Re-evaluated:Patient Re-evaluated prior to inductionOxygen Delivery Method: Circle system utilized Preoxygenation: Pre-oxygenation with 100% oxygen Intubation Type: IV induction Ventilation: Mask ventilation without difficulty Laryngoscope Size: Mac and 3 Grade View: Grade I Tube type: Oral Tube size: 7.5 mm Number of attempts: 1 Airway Equipment and Method: Stylet Placement Confirmation: ETT inserted through vocal cords under direct vision,  positive ETCO2 and breath sounds checked- equal and bilateral Secured at: 23 cm Tube secured with: Tape Dental Injury: Teeth and Oropharynx as per pre-operative assessment  Comments: Attempted LMA #4 and LMA #5 with large air leak an 10 cm H20.  Intubated with 7.5 ETT.

## 2011-10-10 NOTE — H&P (Signed)
[name]. is an 66 y.o. male.   Chief Complaint: Osteomyelitis and ulceration right fourth toe. HPI: Patient is a 66 year old gentleman with diabetic insensate neuropathy who has developed osteomyelitis ulceration right fourth toe. Patient has failed conservative treatment.  Past Medical History  Diagnosis Date  . Diabetes mellitus   . Hypertension   . Seizures   . Stroke   . Peripheral vascular disease   . CKD (chronic kidney disease)     Stage II (per 06/2010 notes)    Past Surgical History  Procedure Date  . Colon surgery     partial colectomy  . Eye surgery     bilat cataract surgery  . Below knee leg amputation     L  . Toe amputation     R two    No family history on file. Social History:  reports that he has never smoked. He does not have any smokeless tobacco history on file. He reports that he does not drink alcohol or use illicit drugs.  Allergies:  Allergies  Allergen Reactions  . Codeine Anaphylaxis  . Penicillins Anaphylaxis    No prescriptions prior to admission    Results for orders placed during the hospital encounter of 10/09/11 (from the past 48 hour(s))  SURGICAL PCR SCREEN     Status: Abnormal   Collection Time   10/09/11 10:38 AM      Component Value Range Comment   MRSA, PCR NEGATIVE  NEGATIVE    Staphylococcus aureus POSITIVE (*) NEGATIVE   APTT     Status: Normal   Collection Time   10/09/11 10:38 AM      Component Value Range Comment   aPTT 33  24 - 37 seconds   CBC     Status: Abnormal   Collection Time   10/09/11 10:38 AM      Component Value Range Comment   WBC 14.7 (*) 4.0 - 10.5 K/uL    RBC 3.37 (*) 4.22 - 5.81 MIL/uL    Hemoglobin 9.6 (*) 13.0 - 17.0 g/dL    HCT 29.2 (*) 39.0 - 52.0 %    MCV 86.6  78.0 - 100.0 fL    MCH 28.5  26.0 - 34.0 pg    MCHC 32.9  30.0 - 36.0 g/dL    RDW 11.7  11.5 - 15.5 %    Platelets 263  150 - 400 K/uL   COMPREHENSIVE METABOLIC PANEL     Status: Abnormal   Collection Time   10/09/11 10:38  AM      Component Value Range Comment   Sodium 135  135 - 145 mEq/L    Potassium 4.9  3.5 - 5.1 mEq/L    Chloride 101  96 - 112 mEq/L    CO2 23  19 - 32 mEq/L    Glucose, Bld 378 (*) 70 - 99 mg/dL    BUN 49 (*) 6 - 23 mg/dL    Creatinine, Ser 1.85 (*) 0.50 - 1.35 mg/dL    Calcium 9.4  8.4 - 10.5 mg/dL    Total Protein 7.2  6.0 - 8.3 g/dL    Albumin 3.0 (*) 3.5 - 5.2 g/dL    AST 14  0 - 37 U/L    ALT 10  0 - 53 U/L    Alkaline Phosphatase 113  39 - 117 U/L    Total Bilirubin 0.2 (*) 0.3 - 1.2 mg/dL    GFR calc non Af Amer 37 (*) >90 mL/min  GFR calc Af Amer 43 (*) >90 mL/min   PROTIME-INR     Status: Normal   Collection Time   10/09/11 10:38 AM      Component Value Range Comment   Prothrombin Time 15.0  11.6 - 15.2 seconds    INR 1.16  0.00 - 1.49    No results found.  Review of Systems  All other systems reviewed and are negative.    There were no vitals taken for this visit. Physical Exam  On examination patient has palpable pulses. There is swelling with ulceration and destructive bony changes of the right fourth toe. Assessment/Plan Assessment: Right fourth toe osteomyelitis and Wagner grade 3 ulceration with diabetic insensate neuropathy.  Plan: We will plan for amputation of the right fourth toe. Risks and benefits of surgery were discussed including infection nonhealing of the wound need for additional surgery. Patient states he understands and wished to proceed at this time.  Climmie Buelow V 10/10/2011, 6:16 AM

## 2011-10-10 NOTE — Anesthesia Postprocedure Evaluation (Signed)
Anesthesia Post Note  Patient: [name].  Procedure(s) Performed: Procedure(s) (LRB): AMPUTATION DIGIT (Right)  Anesthesia type: General  Patient location: PACU  Post pain: Pain level controlled and Adequate analgesia  Post assessment: Post-op Vital signs reviewed, Patient's Cardiovascular Status Stable, Respiratory Function Stable, Patent Airway and Pain level controlled  Last Vitals:  Filed Vitals:   10/10/11 1115  BP: 135/59  Pulse: 73  Temp:   Resp: 17    Post vital signs: Reviewed and stable  Level of consciousness: awake, alert  and oriented  Complications: No apparent anesthesia complications

## 2011-10-10 NOTE — Progress Notes (Signed)
Spoke with Dr Marcie Bal Re pt blood sugar.  Pt stated that yesterday sugar was over 500.  CBG today 392.  Pt did not use sliding scale yesterday at all.  Last took 70/30 insulin last night around 2130.  Ate a sandwich at that time.  Orders received for insulin.

## 2011-10-10 NOTE — Anesthesia Preprocedure Evaluation (Addendum)
Anesthesia Evaluation  Patient identified by MRN, date of birth, ID band Patient awake    Reviewed: Allergy & Precautions, H&P , NPO status , Patient's Chart, lab work & pertinent test results, reviewed documented beta blocker date and time   Airway Mallampati: II TM Distance: <3 FB Neck ROM: full    Dental  (+) Partial Upper and Dental Advisory Given   Pulmonary  breath sounds clear to auscultation        Cardiovascular hypertension, Pt. on medications Rhythm:Regular Rate:Normal     Neuro/Psych Seizures -, Well Controlled,  CVA, Residual Symptoms    GI/Hepatic   Endo/Other  Poorly Controlled, Type 2  Renal/GU Renal InsufficiencyRenal disease     Musculoskeletal   Abdominal (+)  Abdomen: soft. Bowel sounds: normal.  Peds  Hematology   Anesthesia Other Findings   Reproductive/Obstetrics                         Anesthesia Physical Anesthesia Plan  ASA: III  Anesthesia Plan: General   Post-op Pain Management:    Induction: Intravenous  Airway Management Planned: LMA  Additional Equipment:   Intra-op Plan:   Post-operative Plan:   Informed Consent: I have reviewed the patients History and Physical, chart, labs and discussed the procedure including the risks, benefits and alternatives for the proposed anesthesia with the patient or authorized representative who has indicated his/her understanding and acceptance.     Plan Discussed with: CRNA and Surgeon  Anesthesia Plan Comments:         Anesthesia Quick Evaluation

## 2011-10-10 NOTE — Transfer of Care (Signed)
Immediate Anesthesia Transfer of Care Note  Patient: [name].  Procedure(s) Performed: Procedure(s) (LRB): AMPUTATION DIGIT (Right)  Patient Location: PACU  Anesthesia Type: General  Level of Consciousness: sedated  Airway & Oxygen Therapy: Patient Spontanous Breathing  Post-op Assessment: Report given to PACU RN  Post vital signs: stable  Complications: No apparent anesthesia complications

## 2011-10-10 NOTE — Progress Notes (Signed)
Orthopedic Tech Progress Note Patient Details:  [name] 12/14/1945 [id number]  Ortho Devices Type of Ortho Device: Postop boot Ortho Device/Splint Interventions: Application   [name] 10/10/2011, 12:07 PM

## 2011-10-11 ENCOUNTER — Encounter (HOSPITAL_COMMUNITY): Payer: Self-pay | Admitting: Orthopedic Surgery

## 2012-10-04 ENCOUNTER — Other Ambulatory Visit (HOSPITAL_COMMUNITY): Payer: Self-pay | Admitting: Orthopedic Surgery

## 2012-10-04 ENCOUNTER — Encounter (HOSPITAL_COMMUNITY): Payer: Self-pay | Admitting: Pharmacy Technician

## 2012-10-07 ENCOUNTER — Encounter (HOSPITAL_COMMUNITY): Payer: Self-pay

## 2012-10-07 ENCOUNTER — Encounter (HOSPITAL_COMMUNITY)
Admission: RE | Admit: 2012-10-07 | Discharge: 2012-10-07 | Disposition: A | Discharge status: Home / Self Care | Payer: Medicare Other | Source: Ambulatory Visit | Attending: Orthopedic Surgery | Admitting: Orthopedic Surgery

## 2012-10-07 LAB — CBC
MCH: 29.1 pg (ref 26.0–34.0)
MCV: 87 fL (ref 78.0–100.0)
Platelets: 240 10*3/uL (ref 150–400)
RDW: 12.1 % (ref 11.5–15.5)
WBC: 10.7 10*3/uL — ABNORMAL HIGH (ref 4.0–10.5)

## 2012-10-07 LAB — COMPREHENSIVE METABOLIC PANEL
AST: 15 U/L (ref 0–37)
Albumin: 3.5 g/dL (ref 3.5–5.2)
Calcium: 9.7 mg/dL (ref 8.4–10.5)
Creatinine, Ser: 1.75 mg/dL — ABNORMAL HIGH (ref 0.50–1.35)
Total Protein: 7.8 g/dL (ref 6.0–8.3)

## 2012-10-07 LAB — PROTIME-INR: INR: 0.99 (ref 0.00–1.49)

## 2012-10-07 NOTE — Progress Notes (Signed)
RE WEIGH PT DOS.  WEIGHT OBTAINED AT PAT INCLUDED PROSTHESIS

## 2012-10-07 NOTE — Pre-Procedure Instructions (Signed)
[name].  10/07/2012   Your procedure is scheduled on:  October 09, 2012  Report to [place] at 8:30 AM.  Call this number if you have problems the morning of surgery: [phone]   Remember:   Do not eat food or drink liquids after midnight.   Take these medicines the morning of surgery with A SIP OF WATER: metoprolol (LOPRESSOR) 50 MG tablet, phenytoin (DILANTIN)  100 MG ER capsule, timolol (BETIMOL) 0.5 % ophthalmic solution,    Do not wear jewelry, make-up or nail polish.  Do not wear lotions, powders, or perfumes. You may wear deodorant.  Do not shave 48 hours prior to surgery. Men may shave face and neck.  Do not bring valuables to the hospital.  [facility] is not responsible  for any belongings or valuables.  Contacts, dentures or bridgework may not be worn into surgery.  Leave suitcase in the car. After surgery it may be brought to your room.  For patients admitted to the hospital, checkout time is 11:00 AM the day of  discharge.   Patients discharged the day of surgery will not be allowed to drive  home.  Name and phone number of your driver:   Special Instructions: Shower using CHG 2 nights before surgery and the night before surgery.  If you shower the day of surgery use CHG.  Use special wash - you have one bottle of CHG for all showers.  You should use approximately 1/3 of the bottle for each shower.   Please read over the following fact sheets that you were given: Pain Booklet, Coughing and Deep Breathing and Surgical Site Infection Prevention

## 2012-10-08 MED ORDER — CLINDAMYCIN PHOSPHATE 900 MG/50ML IV SOLN
900.0000 mg | INTRAVENOUS | Status: AC
  Administered 2012-10-09: 900 mg via INTRAVENOUS
  Filled 2012-10-08: qty 50

## 2012-10-09 ENCOUNTER — Encounter (HOSPITAL_COMMUNITY): Payer: Self-pay | Admitting: *Deleted

## 2012-10-09 ENCOUNTER — Inpatient Hospital Stay (HOSPITAL_COMMUNITY): Payer: Medicare Other | Admitting: Anesthesiology

## 2012-10-09 ENCOUNTER — Inpatient Hospital Stay (HOSPITAL_COMMUNITY)
Admission: RE | Admit: 2012-10-09 | Discharge: 2012-10-10 | DRG: 623 | Disposition: A | Discharge status: Home / Self Care | Payer: Medicare Other | Source: Ambulatory Visit | Attending: Orthopedic Surgery | Admitting: Orthopedic Surgery

## 2012-10-09 ENCOUNTER — Encounter (HOSPITAL_COMMUNITY): Payer: Self-pay | Admitting: Anesthesiology

## 2012-10-09 ENCOUNTER — Encounter (HOSPITAL_COMMUNITY)
Admission: RE | Disposition: A | Discharge status: Home / Self Care | Payer: Self-pay | Source: Ambulatory Visit | Attending: Orthopedic Surgery

## 2012-10-09 DIAGNOSIS — I129 Hypertensive chronic kidney disease with stage 1 through stage 4 chronic kidney disease, or unspecified chronic kidney disease: Secondary | ICD-10-CM | POA: Diagnosis present

## 2012-10-09 DIAGNOSIS — M9080 Osteopathy in diseases classified elsewhere, unspecified site: Secondary | ICD-10-CM | POA: Diagnosis present

## 2012-10-09 DIAGNOSIS — I739 Peripheral vascular disease, unspecified: Secondary | ICD-10-CM | POA: Diagnosis present

## 2012-10-09 DIAGNOSIS — N189 Chronic kidney disease, unspecified: Secondary | ICD-10-CM | POA: Diagnosis present

## 2012-10-09 DIAGNOSIS — E11610 Type 2 diabetes mellitus with diabetic neuropathic arthropathy: Secondary | ICD-10-CM

## 2012-10-09 DIAGNOSIS — E1169 Type 2 diabetes mellitus with other specified complication: Principal | ICD-10-CM | POA: Diagnosis present

## 2012-10-09 DIAGNOSIS — G988 Other disorders of nervous system: Secondary | ICD-10-CM | POA: Diagnosis present

## 2012-10-09 DIAGNOSIS — G589 Mononeuropathy, unspecified: Secondary | ICD-10-CM | POA: Diagnosis present

## 2012-10-09 DIAGNOSIS — M869 Osteomyelitis, unspecified: Secondary | ICD-10-CM | POA: Diagnosis present

## 2012-10-09 DIAGNOSIS — Z01812 Encounter for preprocedural laboratory examination: Secondary | ICD-10-CM

## 2012-10-09 HISTORY — PX: ORIF TOE FRACTURE: SHX5032

## 2012-10-09 LAB — GLUCOSE, CAPILLARY
Glucose-Capillary: 141 mg/dL — ABNORMAL HIGH (ref 70–99)
Glucose-Capillary: 253 mg/dL — ABNORMAL HIGH (ref 70–99)
Glucose-Capillary: 230 mg/dL — ABNORMAL HIGH (ref 70–99)

## 2012-10-09 SURGERY — OPEN REDUCTION INTERNAL FIXATION (ORIF) METATARSAL (TOE) FRACTURE
Anesthesia: General | Site: Foot | Laterality: Right | Wound class: Dirty or Infected

## 2012-10-09 MED ORDER — GENTAMICIN SULFATE 40 MG/ML IJ SOLN
INTRAMUSCULAR | Status: AC
  Filled 2012-10-09: qty 4

## 2012-10-09 MED ORDER — TORSEMIDE 10 MG PO TABS
10.0000 mg | ORAL_TABLET | Freq: Two times a day (BID) | ORAL | Status: DC
  Administered 2012-10-09 – 2012-10-10 (×3): 10 mg via ORAL
  Filled 2012-10-09 (×5): qty 1

## 2012-10-09 MED ORDER — OXYCODONE HCL 5 MG PO TABS: 5.0000 mg | ORAL_TABLET | Freq: Once | ORAL | Status: DC | PRN

## 2012-10-09 MED ORDER — VANCOMYCIN HCL 500 MG IV SOLR
INTRAVENOUS | Status: AC
  Filled 2012-10-09: qty 500

## 2012-10-09 MED ORDER — OLMESARTAN MEDOXOMIL-HCTZ 20-12.5 MG PO TABS: 1.0000 | ORAL_TABLET | Freq: Every morning | ORAL | Status: DC

## 2012-10-09 MED ORDER — FENTANYL CITRATE 0.05 MG/ML IJ SOLN
INTRAMUSCULAR | Status: AC
  Administered 2012-10-09: 50 ug via INTRAVENOUS
  Filled 2012-10-09: qty 2

## 2012-10-09 MED ORDER — HYDROMORPHONE HCL PF 1 MG/ML IJ SOLN: 0.5000 mg | INTRAMUSCULAR | Status: DC | PRN

## 2012-10-09 MED ORDER — TIMOLOL HEMIHYDRATE 0.5 % OP SOLN: 1.0000 [drp] | Freq: Every morning | OPHTHALMIC | Status: DC

## 2012-10-09 MED ORDER — FENTANYL CITRATE 0.05 MG/ML IJ SOLN
INTRAMUSCULAR | Status: DC | PRN
  Administered 2012-10-09: 100 ug via INTRAVENOUS

## 2012-10-09 MED ORDER — INSULIN ASPART 100 UNIT/ML ~~LOC~~ SOLN
0.0000 [IU] | Freq: Three times a day (TID) | SUBCUTANEOUS | Status: DC
  Administered 2012-10-09 – 2012-10-10 (×2): 8 [IU] via SUBCUTANEOUS

## 2012-10-09 MED ORDER — METOCLOPRAMIDE HCL 10 MG PO TABS: 5.0000 mg | ORAL_TABLET | Freq: Three times a day (TID) | ORAL | Status: DC | PRN

## 2012-10-09 MED ORDER — PHENYTOIN SODIUM EXTENDED 100 MG PO CAPS
300.0000 mg | ORAL_CAPSULE | Freq: Every day | ORAL | Status: DC
  Administered 2012-10-09: 300 mg via ORAL
  Filled 2012-10-09 (×2): qty 3

## 2012-10-09 MED ORDER — PHENYLEPHRINE HCL 10 MG/ML IJ SOLN
INTRAMUSCULAR | Status: DC | PRN
  Administered 2012-10-09 (×4): 80 ug via INTRAVENOUS

## 2012-10-09 MED ORDER — SODIUM CHLORIDE 0.9 % IV SOLN
Freq: Once | INTRAVENOUS | Status: AC
  Administered 2012-10-09: 09:00:00 via INTRAVENOUS

## 2012-10-09 MED ORDER — TRAVOPROST (BAK FREE) 0.004 % OP SOLN
1.0000 [drp] | Freq: Every day | OPHTHALMIC | Status: DC
  Administered 2012-10-09: 1 [drp] via OPHTHALMIC
  Filled 2012-10-09: qty 2.5

## 2012-10-09 MED ORDER — ATORVASTATIN CALCIUM 20 MG PO TABS
20.0000 mg | ORAL_TABLET | Freq: Every day | ORAL | Status: DC
  Administered 2012-10-09: 20 mg via ORAL
  Filled 2012-10-09 (×2): qty 1

## 2012-10-09 MED ORDER — GENTAMICIN SULFATE 40 MG/ML IJ SOLN
INTRAMUSCULAR | Status: DC | PRN
  Administered 2012-10-09: 160 mg

## 2012-10-09 MED ORDER — 0.9 % SODIUM CHLORIDE (POUR BTL) OPTIME
TOPICAL | Status: DC | PRN
  Administered 2012-10-09: 1000 mL

## 2012-10-09 MED ORDER — ONDANSETRON HCL 4 MG PO TABS: 4.0000 mg | ORAL_TABLET | Freq: Four times a day (QID) | ORAL | Status: DC | PRN

## 2012-10-09 MED ORDER — FENTANYL CITRATE 0.05 MG/ML IJ SOLN: 50.0000 ug | INTRAMUSCULAR | Status: DC | PRN

## 2012-10-09 MED ORDER — ONDANSETRON HCL 4 MG/2ML IJ SOLN: 4.0000 mg | Freq: Four times a day (QID) | INTRAMUSCULAR | Status: DC | PRN

## 2012-10-09 MED ORDER — ASPIRIN EC 325 MG PO TBEC
325.0000 mg | DELAYED_RELEASE_TABLET | Freq: Every day | ORAL | Status: DC
  Administered 2012-10-09 – 2012-10-10 (×2): 325 mg via ORAL
  Filled 2012-10-09 (×2): qty 1

## 2012-10-09 MED ORDER — OXYCODONE HCL 5 MG/5ML PO SOLN: 5.0000 mg | Freq: Once | ORAL | Status: DC | PRN

## 2012-10-09 MED ORDER — SODIUM CHLORIDE 0.9 % IV SOLN: INTRAVENOUS | Status: DC

## 2012-10-09 MED ORDER — MIDAZOLAM HCL 5 MG/5ML IJ SOLN
INTRAMUSCULAR | Status: DC | PRN
  Administered 2012-10-09 (×2): 1 mg via INTRAVENOUS

## 2012-10-09 MED ORDER — HYDROCHLOROTHIAZIDE 12.5 MG PO CAPS
12.5000 mg | ORAL_CAPSULE | Freq: Every day | ORAL | Status: DC
  Administered 2012-10-09 – 2012-10-10 (×2): 12.5 mg via ORAL
  Filled 2012-10-09 (×2): qty 1

## 2012-10-09 MED ORDER — METOPROLOL TARTRATE 25 MG PO TABS
25.0000 mg | ORAL_TABLET | Freq: Two times a day (BID) | ORAL | Status: DC
  Administered 2012-10-09 – 2012-10-10 (×2): 25 mg via ORAL
  Filled 2012-10-09 (×4): qty 1

## 2012-10-09 MED ORDER — HYDROCODONE-ACETAMINOPHEN 5-325 MG PO TABS: 1.0000 | ORAL_TABLET | ORAL | Status: DC | PRN

## 2012-10-09 MED ORDER — CLINDAMYCIN PHOSPHATE 600 MG/50ML IV SOLN
600.0000 mg | Freq: Four times a day (QID) | INTRAVENOUS | Status: AC
  Administered 2012-10-09 – 2012-10-10 (×3): 600 mg via INTRAVENOUS
  Filled 2012-10-09 (×4): qty 50

## 2012-10-09 MED ORDER — FENTANYL CITRATE 0.05 MG/ML IJ SOLN: 25.0000 ug | INTRAMUSCULAR | Status: DC | PRN

## 2012-10-09 MED ORDER — SODIUM CHLORIDE 0.9 % IV SOLN
INTRAVENOUS | Status: DC | PRN
  Administered 2012-10-09: 09:00:00 via INTRAVENOUS

## 2012-10-09 MED ORDER — PROPOFOL 10 MG/ML IV BOLUS
INTRAVENOUS | Status: DC | PRN
  Administered 2012-10-09: 180 mg via INTRAVENOUS

## 2012-10-09 MED ORDER — VANCOMYCIN HCL 500 MG IV SOLR
INTRAVENOUS | Status: DC | PRN
  Administered 2012-10-09: 500 mg

## 2012-10-09 MED ORDER — ONDANSETRON HCL 4 MG/2ML IJ SOLN
INTRAMUSCULAR | Status: DC | PRN
  Administered 2012-10-09: 4 mg via INTRAVENOUS

## 2012-10-09 MED ORDER — TIMOLOL MALEATE 0.5 % OP SOLN
1.0000 [drp] | Freq: Every day | OPHTHALMIC | Status: DC
  Administered 2012-10-09 – 2012-10-10 (×2): 1 [drp] via OPHTHALMIC
  Filled 2012-10-09: qty 5

## 2012-10-09 MED ORDER — OXYCODONE-ACETAMINOPHEN 5-325 MG PO TABS: 1.0000 | ORAL_TABLET | ORAL | Status: DC | PRN

## 2012-10-09 MED ORDER — OXYCODONE-ACETAMINOPHEN 5-325 MG PO TABS
ORAL_TABLET | ORAL | Status: AC
  Filled 2012-10-09: qty 2

## 2012-10-09 MED ORDER — METOCLOPRAMIDE HCL 5 MG/ML IJ SOLN: 5.0000 mg | Freq: Three times a day (TID) | INTRAMUSCULAR | Status: DC | PRN

## 2012-10-09 MED ORDER — INSULIN ASPART 100 UNIT/ML ~~LOC~~ SOLN
4.0000 [IU] | Freq: Three times a day (TID) | SUBCUTANEOUS | Status: DC
  Administered 2012-10-09: 4 [IU] via SUBCUTANEOUS

## 2012-10-09 MED ORDER — IRBESARTAN 150 MG PO TABS
150.0000 mg | ORAL_TABLET | Freq: Every day | ORAL | Status: DC
  Administered 2012-10-09 – 2012-10-10 (×2): 150 mg via ORAL
  Filled 2012-10-09 (×2): qty 1

## 2012-10-09 SURGICAL SUPPLY — 57 items
BANDAGE GAUZE ELAST BULKY 4 IN (GAUZE/BANDAGES/DRESSINGS) ×2 IMPLANT
BLADE AVERAGE 25X9 (BLADE) IMPLANT
BLADE MINI RND TIP GREEN BEAV (BLADE) IMPLANT
BNDG CMPR 9X4 STRL LF SNTH (GAUZE/BANDAGES/DRESSINGS) ×1
BNDG COHESIVE 1X5 TAN STRL LF (GAUZE/BANDAGES/DRESSINGS) IMPLANT
BNDG COHESIVE 6X5 TAN STRL LF (GAUZE/BANDAGES/DRESSINGS) ×2 IMPLANT
BNDG ESMARK 4X9 LF (GAUZE/BANDAGES/DRESSINGS) ×2 IMPLANT
BNDG GAUZE STRTCH 6 (GAUZE/BANDAGES/DRESSINGS) IMPLANT
CLOTH BEACON ORANGE TIMEOUT ST (SAFETY) ×2 IMPLANT
CORDS BIPOLAR (ELECTRODE) ×2 IMPLANT
COTTON STERILE ROLL (GAUZE/BANDAGES/DRESSINGS) IMPLANT
COVER SURGICAL LIGHT HANDLE (MISCELLANEOUS) ×2 IMPLANT
CUFF TOURNIQUET SINGLE 18IN (TOURNIQUET CUFF) IMPLANT
CUFF TOURNIQUET SINGLE 24IN (TOURNIQUET CUFF) IMPLANT
CUFF TOURNIQUET SINGLE 34IN LL (TOURNIQUET CUFF) IMPLANT
CUFF TOURNIQUET SINGLE 44IN (TOURNIQUET CUFF) IMPLANT
DRAPE OEC MINIVIEW 54X84 (DRAPES) IMPLANT
DRAPE U-SHAPE 47X51 STRL (DRAPES) ×2 IMPLANT
DRILL BIT CANN (BIT) ×2 IMPLANT
DRSG ADAPTIC 3X8 NADH LF (GAUZE/BANDAGES/DRESSINGS) ×2 IMPLANT
DRSG PAD ABDOMINAL 8X10 ST (GAUZE/BANDAGES/DRESSINGS) ×2 IMPLANT
DURAPREP 26ML APPLICATOR (WOUND CARE) ×2 IMPLANT
ELECT REM PT RETURN 9FT ADLT (ELECTROSURGICAL) ×2
ELECTRODE REM PT RTRN 9FT ADLT (ELECTROSURGICAL) ×1 IMPLANT
GAUZE SPONGE 2X2 8PLY STRL LF (GAUZE/BANDAGES/DRESSINGS) IMPLANT
GLOVE BIOGEL PI IND STRL 9 (GLOVE) ×1 IMPLANT
GLOVE BIOGEL PI INDICATOR 9 (GLOVE) ×1
GLOVE SURG ORTHO 9.0 STRL STRW (GLOVE) ×2 IMPLANT
GOWN PREVENTION PLUS XLARGE (GOWN DISPOSABLE) ×2 IMPLANT
GOWN SRG XL XLNG 56XLVL 4 (GOWN DISPOSABLE) ×1 IMPLANT
GOWN STRL NON-REIN XL XLG LVL4 (GOWN DISPOSABLE) ×2
GUIDEPIN 2.8X300 THRD TIP OIC (Screw) ×2 IMPLANT
KIT BASIN OR (CUSTOM PROCEDURE TRAY) ×2 IMPLANT
KIT ROOM TURNOVER OR (KITS) ×2 IMPLANT
KIT STIMULAN RAPID CURE 5CC (Orthopedic Implant) ×2 IMPLANT
MANIFOLD NEPTUNE II (INSTRUMENTS) ×2 IMPLANT
NEEDLE HYPO 25GX1X1/2 BEV (NEEDLE) IMPLANT
NS IRRIG 1000ML POUR BTL (IV SOLUTION) ×2 IMPLANT
OIC 7.3 cannulated screw, 32mm thread length, 100m ×2 IMPLANT
PACK ORTHO EXTREMITY (CUSTOM PROCEDURE TRAY) ×2 IMPLANT
PAD ARMBOARD 7.5X6 YLW CONV (MISCELLANEOUS) ×4 IMPLANT
PAD CAST 4YDX4 CTTN HI CHSV (CAST SUPPLIES) IMPLANT
PADDING CAST COTTON 4X4 STRL (CAST SUPPLIES)
SCREW CANN 7.3X100 32MM THRD (Screw) ×2 IMPLANT
SCREW CANN THRD 7.3X65 (Screw) ×2 IMPLANT
SPECIMEN JAR SMALL (MISCELLANEOUS) ×2 IMPLANT
SPONGE GAUZE 2X2 STER 10/PKG (GAUZE/BANDAGES/DRESSINGS)
SPONGE GAUZE 4X4 12PLY (GAUZE/BANDAGES/DRESSINGS) ×2 IMPLANT
SUCTION FRAZIER TIP 10 FR DISP (SUCTIONS) IMPLANT
SUT ETHILON 2 0 PSLX (SUTURE) ×4 IMPLANT
SUT VIC AB 2-0 CT1 27 (SUTURE) ×2
SUT VIC AB 2-0 CT1 TAPERPNT 27 (SUTURE) ×1 IMPLANT
SYR CONTROL 10ML LL (SYRINGE) IMPLANT
TOWEL OR 17X24 6PK STRL BLUE (TOWEL DISPOSABLE) ×2 IMPLANT
TOWEL OR 17X26 10 PK STRL BLUE (TOWEL DISPOSABLE) ×2 IMPLANT
TUBE CONNECTING 12X1/4 (SUCTIONS) IMPLANT
WATER STERILE IRR 1000ML POUR (IV SOLUTION) ×2 IMPLANT

## 2012-10-09 NOTE — Progress Notes (Signed)
UR COMPLETED  

## 2012-10-09 NOTE — Anesthesia Preprocedure Evaluation (Addendum)
Anesthesia Evaluation  Patient identified by MRN, date of birth, ID band Patient awake    Reviewed: Allergy & Precautions, H&P , NPO status , Patient's Chart, lab work & pertinent test results  Airway Mallampati: II  Neck ROM: full    Dental  (+) Dental Advisory Given   Pulmonary          Cardiovascular hypertension, Pt. on medications and Pt. on home beta blockers + Peripheral Vascular Disease     Neuro/Psych Seizures -,     GI/Hepatic   Endo/Other  diabetes, Type 2  Renal/GU Renal InsufficiencyRenal disease     Musculoskeletal   Abdominal   Peds  Hematology   Anesthesia Other Findings   Reproductive/Obstetrics                          Anesthesia Physical Anesthesia Plan  ASA: III  Anesthesia Plan: General   Post-op Pain Management:    Induction: Intravenous  Airway Management Planned: LMA  Additional Equipment:   Intra-op Plan:   Post-operative Plan:   Informed Consent: I have reviewed the patients History and Physical, chart, labs and discussed the procedure including the risks, benefits and alternatives for the proposed anesthesia with the patient or authorized representative who has indicated his/her understanding and acceptance.   Dental advisory given  Plan Discussed with: CRNA, Anesthesiologist and Surgeon  Anesthesia Plan Comments:        Anesthesia Quick Evaluation

## 2012-10-09 NOTE — Transfer of Care (Signed)
Immediate Anesthesia Transfer of Care Note  Patient: [name].  Procedure(s) Performed: Procedure(s) with comments: OPEN REDUCTION INTERNAL FIXATION (ORIF) METATARSAL (TOE) FRACTURE (Right) - Right Foot Base 1st Metatarsal and Medial Cuneoform Excision, Internal Fixation, Antibiotic Beads  Patient Location: PACU  Anesthesia Type:GA combined with regional for post-op pain  Level of Consciousness: sedated  Airway & Oxygen Therapy: Patient Spontanous Breathing and Patient connected to nasal cannula oxygen  Post-op Assessment: Report given to PACU RN and Post -op Vital signs reviewed and stable  Post vital signs: Reviewed and stable  Complications: No apparent anesthesia complications

## 2012-10-09 NOTE — Anesthesia Postprocedure Evaluation (Signed)
Anesthesia Post Note  Patient: [name].  Procedure(s) Performed: Procedure(s) (LRB): OPEN REDUCTION INTERNAL FIXATION (ORIF) METATARSAL (TOE) FRACTURE (Right)  Anesthesia type: General  Patient location: PACU  Post pain: Pain level controlled and Adequate analgesia  Post assessment: Post-op Vital signs reviewed, Patient's Cardiovascular Status Stable, Respiratory Function Stable, Patent Airway and Pain level controlled  Last Vitals:  Filed Vitals:   10/09/12 1158  BP: 124/56  Pulse: 70  Temp: 36.4 C  Resp:     Post vital signs: Reviewed and stable  Level of consciousness: awake, alert  and oriented  Complications: No apparent anesthesia complications

## 2012-10-09 NOTE — Progress Notes (Signed)
Orthopedic Tech Progress Note Patient Details:  [name] 12/19/1945 [id number]  Ortho Devices Type of Ortho Device: CAM walker Ortho Device/Splint Interventions: Fonnie Jarvis 10/09/2012, 3:20 PM

## 2012-10-09 NOTE — Preoperative (Signed)
Beta Blockers   Reason not to administer Beta Blockers:Metoprolol taken at 0700 hrs on 10/09/12

## 2012-10-09 NOTE — Op Note (Signed)
OPERATIVE REPORT  DATE OF SURGERY: 10/09/2012  PATIENT:  Devin Rakers.,  67 y.o. male  PRE-OPERATIVE DIAGNOSIS:  Osteomyelitis Right Foot  POST-OPERATIVE DIAGNOSIS:  Osteomyelitis Right Foot  PROCEDURE:  Procedure(s): Excision base of the first metatarsal medial cuneiform. Placement of antibiotic beads with 500 mg vancomycin 160 mg gentamicin. Local tissue rearrangement for closure of wound 10 cm x 5 cm. Internal fixation stabilization medial column right foot with 7.3 cannulated screw.  SURGEON:  Surgeon(s): Newt Minion, MD  ANESTHESIA:   regional and general  EBL:  min ML  SPECIMEN:  No Specimen  TOURNIQUET:   Total Tourniquet Time Documented: Calf (Right) - 32 minutes Total: Calf (Right) - 32 minutes   PROCEDURE DETAILS: Patient is a 67 year old gentleman with ulceration and Charcot collapse right foot with infection. Patient is undergone conservative therapy and there is no resolution the wound the bone is palpable through the wound and patient presents at this time for the above-mentioned procedure. Risks and benefits were discussed including infection neurovascular injury failure of the hardware nonhealing of the wound need for potential amputation. Patient states he understands and wished to proceed at this time. Description of procedure patient brought to the operating room and underwent a general anesthetic. After adequate levels and anesthesia were obtained patient's right lower extremity was prepped using DuraPrep and draped into a sterile field. A medial incision was made longitudinally along the medial column. This curved around the ulcer the ulcer was resected in one block of tissue. Infected tissue that went down to the bone was resected as well. He ostectomy was performed the medial plantar and dorsal aspect of the base of the first metatarsal medial cuneiform. A wedge osteotomy was removed from the base of first metatarsal as well as the medial cuneiform. The  medial column was realigned with restoration of the arch and restoration of the plantar flexion of the first ray. A guidewire was inserted from the base of the first metatarsal which exited distally and a second guidewire was used to establish the internal canal for the medial column fusion. Guidewire was inserted from the first metatarsal to the medial cuneiform into the navicular. This stabilized with a 7.3 mm screw. C-arm fluoroscopy verified reduction. Antibiotic beads were placed circumferentially around the base of first metatarsal medial cuneiform with 500 mg vancomycin and 160 mg gentamicin. Local tissue rearrangement was performed for wound closure. Esmarch was released hemostasis was obtained the incision was closed using 2-0 nylon. The wound was covered with Adaptic orthopedic sponges AB dressing  and Coban. Patient was extubated taken to the PACU in stable condition.  PLAN OF CARE: Admit to inpatient   PATIENT DISPOSITION:  PACU - hemodynamically stable.   Newt Minion, MD 10/09/2012 12:00 PM

## 2012-10-09 NOTE — H&P (Addendum)
[name]. is an 67 y.o. male.   Chief Complaint: Osteomyelitis base of the first metatarsal medial cuneiform a Charcot collapse right foot HPI: Patient is a 67 year old gentleman with diabetic insensate neuropathy and peripheral vascular disease as well as chronic kidney disease who presents for surgical intervention for Charcot collapse ulceration osteomyelitis first metatarsal base and medial cuneiform which has failed conservative therapy.  Past Medical History  Diagnosis Date  . Diabetes mellitus   . Hypertension   . Seizures   . Stroke   . Peripheral vascular disease   . CKD (chronic kidney disease)     Stage II (per 06/2010 notes)    Past Surgical History  Procedure Laterality Date  . Colon surgery      partial colectomy  . Eye surgery      bilat cataract surgery  . Below knee leg amputation      L  . Toe amputation      R two  . Amputation  10/10/2011    Procedure: AMPUTATION DIGIT;  Surgeon: Newt Minion, MD;  Location: [facility];  Service: Orthopedics;  Laterality: Right;  Right Foot 4th Toe Amputation    No family history on file. Social History:  reports that he has never smoked. He does not have any smokeless tobacco history on file. He reports that he drinks about 0.6 ounces of alcohol per week. He reports that he does not use illicit drugs.  Allergies:  Allergies  Allergen Reactions  . Codeine Anaphylaxis  . Penicillins Anaphylaxis    No prescriptions prior to admission    Results for orders placed during the hospital encounter of 10/07/12 (from the past 48 hour(s))  APTT     Status: None   Collection Time    10/07/12  3:13 PM      Result Value Range   aPTT 30  24 - 37 seconds  CBC     Status: Abnormal   Collection Time    10/07/12  3:13 PM      Result Value Range   WBC 10.7 (*) 4.0 - 10.5 K/uL   RBC 4.16 (*) 4.22 - 5.81 MIL/uL   Hemoglobin 12.1 (*) 13.0 - 17.0 g/dL   HCT 36.2 (*) 39.0 - 52.0 %   MCV 87.0  78.0 - 100.0 fL   MCH 29.1  26.0 -  34.0 pg   MCHC 33.4  30.0 - 36.0 g/dL   RDW 12.1  11.5 - 15.5 %   Platelets 240  150 - 400 K/uL  COMPREHENSIVE METABOLIC PANEL     Status: Abnormal   Collection Time    10/07/12  3:13 PM      Result Value Range   Sodium 141  135 - 145 mEq/L   Potassium 5.3 (*) 3.5 - 5.1 mEq/L   Chloride 107  96 - 112 mEq/L   CO2 25  19 - 32 mEq/L   Glucose, Bld 64 (*) 70 - 99 mg/dL   BUN 50 (*) 6 - 23 mg/dL   Creatinine, Ser 1.75 (*) 0.50 - 1.35 mg/dL   Calcium 9.7  8.4 - 10.5 mg/dL   Total Protein 7.8  6.0 - 8.3 g/dL   Albumin 3.5  3.5 - 5.2 g/dL   AST 15  0 - 37 U/L   ALT 12  0 - 53 U/L   Alkaline Phosphatase 134 (*) 39 - 117 U/L   Total Bilirubin 0.1 (*) 0.3 - 1.2 mg/dL   GFR calc non  Af Amer 39 (*) >90 mL/min   GFR calc Af Amer 45 (*) >90 mL/min   Comment:            The eGFR has been calculated     using the CKD EPI equation.     This calculation has not been     validated in all clinical     situations.     eGFR's persistently     <90 mL/min signify     possible Chronic Kidney Disease.  PROTIME-INR     Status: None   Collection Time    10/07/12  3:13 PM      Result Value Range   Prothrombin Time 12.9  11.6 - 15.2 seconds   INR 0.99  0.00 - 1.49   No results found.  Review of Systems  All other systems reviewed and are negative.    There were no vitals taken for this visit. Physical Exam   On examination patient has palpable pulses. He has ulceration and osteomyelitis of the base first metatarsal and medial cuneiform with Charcot collapse right foot. Assessment/Plan Assessment: Ulceration osteomyelitis first metatarsal and medial cuneiform with Charcot collapse right foot.  Plan: We'll plan for right foot excision base of the first metatarsal and medial cuneiform. placement of antibiotic beads local tissue rearrangement for wound closure. Internal fixation for the medial column with an IM screw from the first metatarsal head to the midfoot. Risks and benefits were discussed  including infection neurovascular injury nonhealing of the wound need for additional surgery. Patient states he understands and wishes to proceed at this time.  [name] 10/09/2012, 6:32 AM

## 2012-10-10 ENCOUNTER — Encounter (HOSPITAL_COMMUNITY): Payer: Self-pay | Admitting: General Practice

## 2012-10-10 LAB — GLUCOSE, CAPILLARY

## 2012-10-10 MED ORDER — TRAMADOL HCL 50 MG PO TABS: 50.0000 mg | ORAL_TABLET | Freq: Four times a day (QID) | ORAL | Status: DC | PRN

## 2012-10-10 NOTE — Discharge Summary (Signed)
  Discharge to home in stable condition. Final diagnosis Charcot collapse right foot with osteomyelitis ulceration and diabetic insensate neuropathy. Followup in 2 weeks. Nonweightbearing right foot. Prescription for Ultram for pain.

## 2012-10-10 NOTE — Evaluation (Addendum)
Physical Therapy Evaluation Patient Details Name: [name]. MRN: [id number] DOB: 01-28-1946 Today's Date: 10/10/2012 Time: [id number] PT Time Calculation (min): 37 min  PT Assessment / Plan / Recommendation History of Present Illness  Pt. 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  He underwent excision of the base of 1st metatarsal med. cuneiform with antibiotic bead placement.  He is now NWB on RLE and has BKA with prosthesis on L.  Clinical Impression  Pt. Is well aware of his NWB stauts and verbalizes his intentions to maintain this faithfully to maximize his chances of healing the foot.  He present to PT with decrease in his usual level of function and will benefit from HHPT followup for progressing his mobility at home so he can be at mod. I level when wife returns to work in 1 1/2 weeks.  Wife reports pt. Has walk in shower and tub bench in shower.  She was educated on safe technique for pt. To get into the shower but she says he will sponge bathe till he can bear weight on R LE. Also recommended to pt. And wife to have pt. Negotiate one step to enter home via w/c and technique was demonstrated.  Pt's son will help get pt. Into home.  Wife says she plans to have ramp built ASAP. He is DCing this am, so will sign off.    PT Assessment  All further PT needs can be met in the next venue of care    Follow Up Recommendations  Home health PT;Supervision/Assistance - 24 hour;Supervision for mobility/OOB    Does the patient have the potential to tolerate intense rehabilitation      Barriers to Discharge        Equipment Recommendations  3in1 (PT)    Recommendations for Other Services     Frequency      Precautions / Restrictions Precautions Precautions: Fall Restrictions Weight Bearing Restrictions: Yes RLE Weight Bearing: Non weight bearing LLE Weight Bearing: Weight bearing as tolerated   Pertinent  Vitals/Pain Pt. Denies pain but reports throbbing in R foot when it is in dependent position during mobility.  The throbbing is relieved when his foot is re-elevated on 2 pillows.      Mobility  Bed Mobility Bed Mobility: Supine to Sit;Sitting - Scoot to Edge of Bed Supine to Sit: 6: Modified independent (Device/Increase time);HOB flat Sitting - Scoot to Edge of Bed: 6: Modified independent (Device/Increase time) Details for Bed Mobility Assistance: manages well on his own, keeps R LE in elevated position during transition to sittine Transfers Transfers: Sit to Stand;Stand to Sit Sit to Stand: 4: Min assist;From bed;With upper extremity assist;From elevated surface Stand to Sit: 4: Min assist;To chair/3-in-1;With upper extremity assist;With armrests Details for Transfer Assistance: cues for hand placment and technique.  Pt. needed mibn assist to rise to stand and to control descent to sitting position.  Demonstrated good use of UEs to assist self to upright and standing. Ambulation/Gait Ambulation/Gait Assistance: 4: Min assist Ambulation Distance (Feet): 8 Feet Assistive device: Rolling walker Ambulation/Gait Assistance Details: Pt. able to ambulate 8' with RW and use of L LE prosthesis and still maintain NWB status successfully. Gait Pattern: Step-to pattern (single leg hop with prosthetic leg)    Exercises     PT Diagnosis: Difficulty walking;Abnormality of gait  PT Problem List: Decreased activity tolerance;Decreased mobility;Decreased knowledge of use of DME;Decreased knowledge of precautions PT Treatment Interventions:  PT Goals(Current goals can be found in the care plan section) Acute Rehab PT Goals Patient Stated Goal: to avoid losing R LE and to comply with NWB status  Visit Information  Last PT Received On: 10/10/12 Assistance Needed: +1 History of Present Illness: Pt. was admitted with charcot collapse of right foot with ostepmyelitis, ulceration and diabetic  insensaqte neuropathy.  He underwent excision of the base of 1st metatarsal med. cuneiform with antibiotic bead placement.  He is now NWB on RLE and has BKA with prosthesis on L.       Prior [place] expects to be discharged to:: Private residence Living Arrangements: Spouse/significant other Available Help at Discharge: Family;Available 24 hours/day Type of Home: House Home Access: Stairs to enter [employer] of Steps: 1 Entrance Stairs-Rails: None Home Layout: One level Home Equipment: Other (comment);Tub bench;Wheelchair - manual;Walker - 2 wheels (L BKA prosthesis) Prior Function Level of Independence: Independent with assistive device(s) Comments: pt. reports he was maneuvering in the home with prosthesis and partial weight on R LE independently PTA Communication Communication: No difficulties    Cognition  Cognition Arousal/Alertness: Awake/alert Behavior During Therapy: WFL for tasks assessed/performed Overall Cognitive Status: Within Functional Limits for tasks assessed    Extremity/Trunk Assessment Upper Extremity Assessment Upper Extremity Assessment: Overall WFL for tasks assessed Lower Extremity Assessment Lower Extremity Assessment: Overall WFL for tasks assessed (grossly assessed) Cervical / Trunk Assessment Cervical / Trunk Assessment: Normal   Balance Balance Balance Assessed: Yes Static Sitting Balance Static Sitting - Balance Support: No upper extremity supported Static Sitting - Level of Assistance: 7: Independent Dynamic Sitting Balance Dynamic Sitting - Balance Support: During functional activity;No upper extremity supported (application of prosthesis at EOB) Dynamic Sitting - Level of Assistance: 5: Stand by assistance Dynamic Sitting Balance - Compensations: tends to lean backward in order to apply his liners but in a controlled manner and abdominals appear srong enough for this task Static Standing  Balance Static Standing - Balance Support: Bilateral upper extremity supported;During functional activity Static Standing - Level of Assistance: 4: Min assist  End of Session PT - End of Session Equipment Utilized During Treatment: Gait belt;Other (comment) (left LE prosthesis) Activity Tolerance: Patient tolerated treatment well Patient left: in chair;with call bell/phone within reach;with family/visitor present Nurse Communication: Mobility status;Precautions;Weight bearing status  GP     Ladona Ridgel 10/10/2012, 12:13 PM Gerlean Ren PT Acute Rehab Services [place] [phone]

## 2012-10-10 NOTE — Progress Notes (Signed)
Patient discharged to home accompanied by family. Discharge instructions and rx given and explained and patient stated understanding. Patients IV was removed. Patient left unit in a stable condition via wheelchair.

## 2012-10-10 NOTE — Progress Notes (Signed)
10/10/12 Spoke with patient and his wife about [facility]. They selected [facility]. Contacted Itay Mella with [facility], they are not able to service the patient due to current caseload. Patient's second choice is [facility]. Conatcted Adalid Beckmann with [facility] and set up HHPT. [employer] delivered 3N1 to patient's room prior to discharge. Fuller Plan RN, BSN, CCM

## 2012-10-15 ENCOUNTER — Encounter (HOSPITAL_COMMUNITY): Payer: Self-pay | Admitting: Orthopedic Surgery

## 2012-10-22 ENCOUNTER — Encounter (HOSPITAL_COMMUNITY): Payer: Self-pay

## 2012-10-22 ENCOUNTER — Inpatient Hospital Stay (HOSPITAL_COMMUNITY)
Admission: EM | Admit: 2012-10-22 | Discharge: 2012-11-01 | DRG: 856 | Disposition: A | Discharge status: Home Health | Payer: Medicare Other | Attending: Internal Medicine | Admitting: Internal Medicine

## 2012-10-22 DIAGNOSIS — G929 Unspecified toxic encephalopathy: Secondary | ICD-10-CM | POA: Diagnosis present

## 2012-10-22 DIAGNOSIS — R6520 Severe sepsis without septic shock: Secondary | ICD-10-CM | POA: Diagnosis present

## 2012-10-22 DIAGNOSIS — E872 Acidosis, unspecified: Secondary | ICD-10-CM | POA: Diagnosis present

## 2012-10-22 DIAGNOSIS — L03119 Cellulitis of unspecified part of limb: Secondary | ICD-10-CM | POA: Diagnosis present

## 2012-10-22 DIAGNOSIS — E1049 Type 1 diabetes mellitus with other diabetic neurological complication: Secondary | ICD-10-CM | POA: Diagnosis present

## 2012-10-22 DIAGNOSIS — Z880 Allergy status to penicillin: Secondary | ICD-10-CM

## 2012-10-22 DIAGNOSIS — N179 Acute kidney failure, unspecified: Secondary | ICD-10-CM | POA: Diagnosis present

## 2012-10-22 DIAGNOSIS — M869 Osteomyelitis, unspecified: Secondary | ICD-10-CM | POA: Diagnosis present

## 2012-10-22 DIAGNOSIS — G92 Toxic encephalopathy: Secondary | ICD-10-CM | POA: Diagnosis present

## 2012-10-22 DIAGNOSIS — Z8673 Personal history of transient ischemic attack (TIA), and cerebral infarction without residual deficits: Secondary | ICD-10-CM

## 2012-10-22 DIAGNOSIS — I739 Peripheral vascular disease, unspecified: Secondary | ICD-10-CM | POA: Diagnosis present

## 2012-10-22 DIAGNOSIS — N289 Disorder of kidney and ureter, unspecified: Secondary | ICD-10-CM

## 2012-10-22 DIAGNOSIS — D638 Anemia in other chronic diseases classified elsewhere: Secondary | ICD-10-CM | POA: Diagnosis present

## 2012-10-22 DIAGNOSIS — R3911 Hesitancy of micturition: Secondary | ICD-10-CM | POA: Diagnosis present

## 2012-10-22 DIAGNOSIS — L02619 Cutaneous abscess of unspecified foot: Secondary | ICD-10-CM | POA: Diagnosis present

## 2012-10-22 DIAGNOSIS — N401 Enlarged prostate with lower urinary tract symptoms: Secondary | ICD-10-CM | POA: Diagnosis present

## 2012-10-22 DIAGNOSIS — Y838 Other surgical procedures as the cause of abnormal reaction of the patient, or of later complication, without mention of misadventure at the time of the procedure: Secondary | ICD-10-CM | POA: Diagnosis present

## 2012-10-22 DIAGNOSIS — D62 Acute posthemorrhagic anemia: Secondary | ICD-10-CM | POA: Diagnosis present

## 2012-10-22 DIAGNOSIS — L97509 Non-pressure chronic ulcer of other part of unspecified foot with unspecified severity: Secondary | ICD-10-CM | POA: Diagnosis present

## 2012-10-22 DIAGNOSIS — Z7982 Long term (current) use of aspirin: Secondary | ICD-10-CM

## 2012-10-22 DIAGNOSIS — M9080 Osteopathy in diseases classified elsewhere, unspecified site: Secondary | ICD-10-CM | POA: Diagnosis present

## 2012-10-22 DIAGNOSIS — L98499 Non-pressure chronic ulcer of skin of other sites with unspecified severity: Secondary | ICD-10-CM | POA: Diagnosis present

## 2012-10-22 DIAGNOSIS — R4182 Altered mental status, unspecified: Secondary | ICD-10-CM | POA: Diagnosis present

## 2012-10-22 DIAGNOSIS — E1069 Type 1 diabetes mellitus with other specified complication: Secondary | ICD-10-CM | POA: Diagnosis present

## 2012-10-22 DIAGNOSIS — Z79899 Other long term (current) drug therapy: Secondary | ICD-10-CM

## 2012-10-22 DIAGNOSIS — N138 Other obstructive and reflux uropathy: Secondary | ICD-10-CM | POA: Diagnosis present

## 2012-10-22 DIAGNOSIS — N182 Chronic kidney disease, stage 2 (mild): Secondary | ICD-10-CM | POA: Diagnosis present

## 2012-10-22 DIAGNOSIS — S88119A Complete traumatic amputation at level between knee and ankle, unspecified lower leg, initial encounter: Secondary | ICD-10-CM

## 2012-10-22 DIAGNOSIS — I129 Hypertensive chronic kidney disease with stage 1 through stage 4 chronic kidney disease, or unspecified chronic kidney disease: Secondary | ICD-10-CM | POA: Diagnosis present

## 2012-10-22 DIAGNOSIS — E119 Type 2 diabetes mellitus without complications: Secondary | ICD-10-CM | POA: Diagnosis present

## 2012-10-22 DIAGNOSIS — T8140XA Infection following a procedure, unspecified, initial encounter: Principal | ICD-10-CM | POA: Diagnosis present

## 2012-10-22 DIAGNOSIS — E1059 Type 1 diabetes mellitus with other circulatory complications: Secondary | ICD-10-CM | POA: Diagnosis present

## 2012-10-22 DIAGNOSIS — L089 Local infection of the skin and subcutaneous tissue, unspecified: Secondary | ICD-10-CM | POA: Diagnosis present

## 2012-10-22 DIAGNOSIS — G40909 Epilepsy, unspecified, not intractable, without status epilepticus: Secondary | ICD-10-CM | POA: Diagnosis present

## 2012-10-22 DIAGNOSIS — R55 Syncope and collapse: Secondary | ICD-10-CM

## 2012-10-22 DIAGNOSIS — D649 Anemia, unspecified: Secondary | ICD-10-CM

## 2012-10-22 DIAGNOSIS — A419 Sepsis, unspecified organism: Secondary | ICD-10-CM | POA: Diagnosis present

## 2012-10-22 DIAGNOSIS — G589 Mononeuropathy, unspecified: Secondary | ICD-10-CM | POA: Diagnosis present

## 2012-10-22 DIAGNOSIS — L97519 Non-pressure chronic ulcer of other part of right foot with unspecified severity: Secondary | ICD-10-CM | POA: Diagnosis present

## 2012-10-22 DIAGNOSIS — Z794 Long term (current) use of insulin: Secondary | ICD-10-CM

## 2012-10-22 DIAGNOSIS — S98139A Complete traumatic amputation of one unspecified lesser toe, initial encounter: Secondary | ICD-10-CM

## 2012-10-22 DIAGNOSIS — N400 Enlarged prostate without lower urinary tract symptoms: Secondary | ICD-10-CM | POA: Diagnosis present

## 2012-10-22 LAB — GLUCOSE, CAPILLARY: Glucose-Capillary: 319 mg/dL — ABNORMAL HIGH (ref 70–99)

## 2012-10-22 NOTE — ED Provider Notes (Signed)
CSN: [id number]     Arrival date & time 10/22/12  2328 History     First MD Initiated Contact with Patient 10/22/12 2345     Chief Complaint  Patient presents with  . Near Syncope   (Consider location/radiation/quality/duration/timing/severity/associated sxs/prior Treatment) HPI Chronic left BKA, recent right foot surg, unwitnessed spell on commode tonight unknown if syncope/seizure, transiently slow to respond, no headache/CP/SOB or focal neuro Sxs, baseline numbness foot, dressing right foot. Feels baseline in ED. Past Medical History  Diagnosis Date  . Diabetes mellitus   . Hypertension   . Seizures   . Stroke   . Peripheral vascular disease   . CKD (chronic kidney disease)     Stage II (per 06/2010 notes)  . Neuromuscular disorder    Past Surgical History  Procedure Laterality Date  . Colon surgery      partial colectomy  . Eye surgery      bilat cataract surgery  . Below knee leg amputation      L  . Toe amputation      R two  . Amputation  10/10/2011    Procedure: AMPUTATION DIGIT;  Surgeon: Newt Minion, MD;  Location: [place];  Service: Orthopedics;  Laterality: Right;  Right Foot 4th Toe Amputation  . Orif toe fracture Right 10/09/2012    Procedure: OPEN REDUCTION INTERNAL FIXATION (ORIF) METATARSAL (TOE) FRACTURE;  Surgeon: Newt Minion, MD;  Location: [place];  Service: Orthopedics;  Laterality: Right;  Right Foot Base 1st Metatarsal and Medial Cuneoform Excision, Internal Fixation, Antibiotic Beads   No family history on file. History  Substance Use Topics  . Smoking status: Never Smoker   . Smokeless tobacco: Never Used  . Alcohol Use: 0.6 oz/week    1 Glasses of wine per week     Comment: seldom    Review of Systems 10 Systems reviewed and are negative for acute change except as noted in the HPI. Allergies  Codeine and Penicillins  Home Medications   No current outpatient prescriptions on file. BP 121/50  Pulse 75  Temp(Src) 98.9 F (37.2 C) (Oral)   Resp 19  Ht 5\' 11"  (1.803 m)  Wt 178 lb 9.2 oz (81 kg)  BMI 24.92 kg/m2  SpO2 100% Physical Exam  Nursing note and vitals reviewed. Constitutional: He is oriented to person, place, and time.  Awake, alert, nontoxic appearance with baseline speech for patient.  HENT:  Head: Atraumatic.  Mouth/Throat: No oropharyngeal exudate.  Eyes: EOM are normal. Pupils are equal, round, and reactive to light. Right eye exhibits no discharge. Left eye exhibits no discharge.  Neck: Neck supple.  Cardiovascular: Normal rate and regular rhythm.   No murmur heard. Pulmonary/Chest: Effort normal and breath sounds normal. No stridor. No respiratory distress. He has no wheezes. He has no rales. He exhibits no tenderness.  Abdominal: Soft. Bowel sounds are normal. He exhibits no mass. There is no tenderness. There is no rebound.  Musculoskeletal: He exhibits no tenderness.  Baseline ROM, moves extremities with no obvious new focal weakness.  Lymphadenopathy:    He has no cervical adenopathy.  Neurological: He is alert and oriented to person, place, and time.  Awake, alert, cooperative and aware of situation; motor strength 5/5 bilaterally; sensation normal to light touch bilaterally except baseline numbness foot; peripheral visual fields full to confrontation; no facial asymmetry; tongue midline; major cranial nerves appear intact; no pronator drift, normal finger to nose bilaterally  Skin: No rash noted.  Psychiatric: He  has a normal mood and affect.    ED Course  D/w Med for admit.Patient / Family / Caregiver informed of clinical course, understand medical decision-making process, and agree with plan. Procedures (including critical care time) ECG: Sinus rhythm, ventricular rate 89, normal axis, normal intervals, no acute ischemic changes noted, no significant change noted compared with July 2014 Labs Reviewed  GLUCOSE, CAPILLARY - Abnormal; Notable for the following:    Glucose-Capillary 319 (*)     All other components within normal limits  PHENYTOIN LEVEL, TOTAL - Abnormal; Notable for the following:    Phenytoin Lvl <2.5 (*)    All other components within normal limits  CBC WITH DIFFERENTIAL - Abnormal; Notable for the following:    WBC 17.0 (*)    RBC 2.76 (*)    Hemoglobin 7.8 (*)    HCT 22.9 (*)    Platelets 539 (*)    Neutrophils Relative % 86 (*)    Neutro Abs 14.7 (*)    Lymphocytes Relative 8 (*)    All other components within normal limits  CBC - Abnormal; Notable for the following:    WBC 15.9 (*)    RBC 2.26 (*)    Hemoglobin 6.4 (*)    HCT 19.0 (*)    Platelets 476 (*)    All other components within normal limits  BASIC METABOLIC PANEL - Abnormal; Notable for the following:    Sodium 132 (*)    CO2 15 (*)    Glucose, Bld 309 (*)    BUN 96 (*)    Creatinine, Ser 2.61 (*)    Calcium 8.2 (*)    GFR calc non Af Amer 24 (*)    GFR calc Af Amer 28 (*)    All other components within normal limits  PHENYTOIN LEVEL, TOTAL - Abnormal; Notable for the following:    Phenytoin Lvl 8.6 (*)    All other components within normal limits  GLUCOSE, CAPILLARY - Abnormal; Notable for the following:    Glucose-Capillary 312 (*)    All other components within normal limits  GLUCOSE, CAPILLARY - Abnormal; Notable for the following:    Glucose-Capillary 284 (*)    All other components within normal limits  POCT I-STAT, CHEM 8 - Abnormal; Notable for the following:    Sodium 131 (*)    BUN 100 (*)    Creatinine, Ser 3.10 (*)    Glucose, Bld 365 (*)    Hemoglobin 8.2 (*)    HCT 24.0 (*)    All other components within normal limits  MRSA PCR SCREENING  CULTURE, BLOOD (ROUTINE X 2)  CULTURE, BLOOD (ROUTINE X 2)  URINALYSIS, ROUTINE W REFLEX MICROSCOPIC  LACTIC ACID, PLASMA  PHENYTOIN LEVEL, TOTAL  ALBUMIN  CBC  OCCULT BLOOD X 1 CARD TO LAB, STOOL  CBC  RENAL FUNCTION PANEL  OCCULT BLOOD, POC DEVICE  TYPE AND SCREEN  PREPARE RBC (CROSSMATCH)   US Renal  10/23/2012    *RADIOLOGY REPORT*  Clinical Data: Acute renal insufficiency.  RENAL/URINARY TRACT ULTRASOUND COMPLETE  Comparison:  None.  Findings:  Right Kidney:  Measures 11.7 cm.  No stone, mass or hydronephrosis. Mildly increased cortical echogenicity is noted.  Left Kidney:  Measures 12.2 cm.  No stone or hydronephrosis. A 0.5 cm hyperechoic focus with posterior shadowing compatible with a stone is noted.  Cortical echogenicity is somewhat increased.  Bladder:  Bilateral ureteral jets are visualized. The prostate gland appears prominent measuring 4.3 x 3.8 x 4.8 cm.  IMPRESSION:  1.  Negative for hydronephrosis or other acute abnormality. 2.  Increased cortical echogenicity of the kidneys compatible with medical renal disease. 3.  0.5 cm nonobstructing stone mid pole left kidney.   Original Report Authenticated By: Orlean Patten, M.D.   1. Syncope   2. Anemia   3. Renal insufficiency   4. AKI (acute kidney injury)   5. Altered mental status   6. DM2 (diabetes mellitus, type 2)     MDM  The patient appears reasonably stabilized for admission considering the current resources, flow, and capabilities available in the ED at this time, and I doubt any other [facility] requiring further screening and/or treatment in the ED prior to admission.  Babette Relic, MD 10/23/12 [phone]

## 2012-10-22 NOTE — ED Notes (Signed)
Patient from home was sitting on commode having a BM. Wife noted that he was in the bathroom too long and went in to check on him. Patient was "out of it" (speaking but not communicating to his norm)   At EMS arrival, patient alert and weak; still on commode; poor radial pulse, pale and clammy. Placed on stretcher. Shortly afterward, patient fully AAOx4, resp e/u, skin warm and dry; without any complaints. No neuro deficits.   BP 128/74 HR 90 SPO2 99 RA CBG 358

## 2012-10-22 NOTE — Addendum Note (Signed)
Addendum created 10/22/12 1418 by Sydnee Levans, MD   Modules edited: Anesthesia Events

## 2012-10-22 NOTE — ED Notes (Signed)
MD at bedside. 

## 2012-10-22 NOTE — ED Notes (Signed)
Pt's CBG is 319. RN notified

## 2012-10-23 ENCOUNTER — Emergency Department (HOSPITAL_COMMUNITY): Payer: Medicare Other

## 2012-10-23 ENCOUNTER — Other Ambulatory Visit: Payer: Self-pay

## 2012-10-23 ENCOUNTER — Other Ambulatory Visit: Payer: Self-pay | Admitting: *Deleted

## 2012-10-23 DIAGNOSIS — A419 Sepsis, unspecified organism: Secondary | ICD-10-CM | POA: Diagnosis present

## 2012-10-23 DIAGNOSIS — R4182 Altered mental status, unspecified: Secondary | ICD-10-CM | POA: Diagnosis present

## 2012-10-23 DIAGNOSIS — L97909 Non-pressure chronic ulcer of unspecified part of unspecified lower leg with unspecified severity: Secondary | ICD-10-CM

## 2012-10-23 DIAGNOSIS — N179 Acute kidney failure, unspecified: Secondary | ICD-10-CM

## 2012-10-23 DIAGNOSIS — R55 Syncope and collapse: Secondary | ICD-10-CM

## 2012-10-23 DIAGNOSIS — I739 Peripheral vascular disease, unspecified: Secondary | ICD-10-CM

## 2012-10-23 DIAGNOSIS — E119 Type 2 diabetes mellitus without complications: Secondary | ICD-10-CM | POA: Diagnosis present

## 2012-10-23 LAB — GLUCOSE, CAPILLARY
Glucose-Capillary: 312 mg/dL — ABNORMAL HIGH (ref 70–99)
Glucose-Capillary: 284 mg/dL — ABNORMAL HIGH (ref 70–99)

## 2012-10-23 LAB — CBC WITH DIFFERENTIAL/PLATELET
Basophils Absolute: 0 10*3/uL (ref 0.0–0.1)
Basophils Relative: 0 % (ref 0–1)
Hemoglobin: 7.8 g/dL — ABNORMAL LOW (ref 13.0–17.0)
Lymphocytes Relative: 8 % — ABNORMAL LOW (ref 12–46)
MCHC: 34.1 g/dL (ref 30.0–36.0)
Monocytes Relative: 6 % (ref 3–12)
Neutro Abs: 14.7 10*3/uL — ABNORMAL HIGH (ref 1.7–7.7)
Neutrophils Relative %: 86 % — ABNORMAL HIGH (ref 43–77)
RBC: 2.76 MIL/uL — ABNORMAL LOW (ref 4.22–5.81)
WBC: 17 10*3/uL — ABNORMAL HIGH (ref 4.0–10.5)

## 2012-10-23 LAB — URINALYSIS, ROUTINE W REFLEX MICROSCOPIC
Bilirubin Urine: NEGATIVE
Glucose, UA: NEGATIVE mg/dL
Ketones, ur: NEGATIVE mg/dL
Leukocytes, UA: NEGATIVE
Protein, ur: NEGATIVE mg/dL

## 2012-10-23 LAB — CBC
HCT: 24.2 % — ABNORMAL LOW (ref 39.0–52.0)
MCH: 28.3 pg (ref 26.0–34.0)
MCH: 29.3 pg (ref 26.0–34.0)
MCV: 84.1 fL (ref 78.0–100.0)
MCV: 84.3 fL (ref 78.0–100.0)
Platelets: 476 10*3/uL — ABNORMAL HIGH (ref 150–400)
RBC: 2.26 MIL/uL — ABNORMAL LOW (ref 4.22–5.81)
RDW: 12.9 % (ref 11.5–15.5)
RDW: 13.2 % (ref 11.5–15.5)
WBC: 15.9 10*3/uL — ABNORMAL HIGH (ref 4.0–10.5)

## 2012-10-23 LAB — POCT I-STAT, CHEM 8
Chloride: 104 mEq/L (ref 96–112)
Glucose, Bld: 365 mg/dL — ABNORMAL HIGH (ref 70–99)
HCT: 24 % — ABNORMAL LOW (ref 39.0–52.0)
Potassium: 4.9 mEq/L (ref 3.5–5.1)

## 2012-10-23 LAB — OCCULT BLOOD, POC DEVICE: Fecal Occult Bld: NEGATIVE

## 2012-10-23 LAB — PREPARE RBC (CROSSMATCH)

## 2012-10-23 LAB — BASIC METABOLIC PANEL
BUN: 96 mg/dL — ABNORMAL HIGH (ref 6–23)
CO2: 15 mEq/L — ABNORMAL LOW (ref 19–32)
Calcium: 8.2 mg/dL — ABNORMAL LOW (ref 8.4–10.5)
Creatinine, Ser: 2.61 mg/dL — ABNORMAL HIGH (ref 0.50–1.35)

## 2012-10-23 LAB — ALBUMIN: Albumin: 1.7 g/dL — ABNORMAL LOW (ref 3.5–5.2)

## 2012-10-23 LAB — PHENYTOIN LEVEL, TOTAL: Phenytoin Lvl: <2.5 ug/mL — ABNORMAL LOW (ref 10.0–20.0)

## 2012-10-23 MED ORDER — TRAMADOL HCL 50 MG PO TABS
50.0000 mg | ORAL_TABLET | Freq: Four times a day (QID) | ORAL | Status: DC | PRN
  Administered 2012-10-27 – 2012-10-30 (×5): 50 mg via ORAL
  Filled 2012-10-23 (×5): qty 1

## 2012-10-23 MED ORDER — SODIUM CHLORIDE 0.9 % IV SOLN
INTRAVENOUS | Status: DC
  Administered 2012-10-23 – 2012-10-28 (×9): via INTRAVENOUS

## 2012-10-23 MED ORDER — VANCOMYCIN HCL IN DEXTROSE 1-5 GM/200ML-% IV SOLN: 1000.0000 mg | INTRAVENOUS | Status: DC

## 2012-10-23 MED ORDER — SODIUM CHLORIDE 0.9 % IV SOLN
1000.0000 mg | Freq: Once | INTRAVENOUS | Status: AC
  Administered 2012-10-23: 1000 mg via INTRAVENOUS
  Filled 2012-10-23: qty 20

## 2012-10-23 MED ORDER — SENNOSIDES-DOCUSATE SODIUM 8.6-50 MG PO TABS
1.0000 | ORAL_TABLET | Freq: Two times a day (BID) | ORAL | Status: DC
  Administered 2012-10-23 – 2012-10-30 (×12): 1 via ORAL
  Filled 2012-10-23 (×17): qty 1

## 2012-10-23 MED ORDER — ASPIRIN 325 MG PO TABS
325.0000 mg | ORAL_TABLET | Freq: Every day | ORAL | Status: DC
Start: 2012-10-23 — End: 2012-10-23
  Filled 2012-10-23: qty 1

## 2012-10-23 MED ORDER — PHENYTOIN SODIUM EXTENDED 100 MG PO CAPS
200.0000 mg | ORAL_CAPSULE | Freq: Two times a day (BID) | ORAL | Status: DC
  Administered 2012-10-23 – 2012-11-01 (×18): 200 mg via ORAL
  Filled 2012-10-23 (×19): qty 2

## 2012-10-23 MED ORDER — PHENYTOIN SODIUM EXTENDED 100 MG PO CAPS
300.0000 mg | ORAL_CAPSULE | Freq: Every day | ORAL | Status: DC
  Filled 2012-10-23: qty 3

## 2012-10-23 MED ORDER — SODIUM CHLORIDE 0.9 % IJ SOLN
3.0000 mL | Freq: Two times a day (BID) | INTRAMUSCULAR | Status: DC
  Administered 2012-10-23 – 2012-11-01 (×11): 3 mL via INTRAVENOUS

## 2012-10-23 MED ORDER — TRAVOPROST (BAK FREE) 0.004 % OP SOLN
1.0000 [drp] | Freq: Every day | OPHTHALMIC | Status: DC
  Administered 2012-10-23 – 2012-10-31 (×10): 1 [drp] via OPHTHALMIC
  Filled 2012-10-23: qty 2.5

## 2012-10-23 MED ORDER — INSULIN ASPART 100 UNIT/ML ~~LOC~~ SOLN
0.0000 [IU] | Freq: Every day | SUBCUTANEOUS | Status: DC
  Administered 2012-10-24: 3 [IU] via SUBCUTANEOUS
  Administered 2012-10-28: 2 [IU] via SUBCUTANEOUS

## 2012-10-23 MED ORDER — TIMOLOL MALEATE 0.5 % OP SOLN
1.0000 [drp] | Freq: Every day | OPHTHALMIC | Status: DC
  Administered 2012-10-23 – 2012-11-01 (×9): 1 [drp] via OPHTHALMIC
  Filled 2012-10-23 (×2): qty 5

## 2012-10-23 MED ORDER — SODIUM CHLORIDE 0.9 % IV BOLUS (SEPSIS)
2000.0000 mL | Freq: Once | INTRAVENOUS | Status: AC
  Administered 2012-10-23: 2000 mL via INTRAVENOUS

## 2012-10-23 MED ORDER — INSULIN ASPART 100 UNIT/ML ~~LOC~~ SOLN
0.0000 [IU] | Freq: Three times a day (TID) | SUBCUTANEOUS | Status: DC
  Administered 2012-10-23: 11 [IU] via SUBCUTANEOUS

## 2012-10-23 MED ORDER — ACETAMINOPHEN 325 MG PO TABS
650.0000 mg | ORAL_TABLET | Freq: Four times a day (QID) | ORAL | Status: DC | PRN
  Administered 2012-10-23 – 2012-10-27 (×4): 650 mg via ORAL
  Filled 2012-10-23 (×4): qty 2

## 2012-10-23 MED ORDER — HEPARIN SODIUM (PORCINE) 5000 UNIT/ML IJ SOLN
5000.0000 [IU] | Freq: Three times a day (TID) | INTRAMUSCULAR | Status: DC
  Administered 2012-10-23: 5000 [IU] via SUBCUTANEOUS
  Filled 2012-10-23 (×4): qty 1

## 2012-10-23 MED ORDER — ACETAMINOPHEN 650 MG RE SUPP: 650.0000 mg | RECTAL | Status: DC | PRN

## 2012-10-23 MED ORDER — PHENYTOIN SODIUM EXTENDED 100 MG PO CAPS: 200.0000 mg | ORAL_CAPSULE | Freq: Every day | ORAL | Status: DC

## 2012-10-23 MED ORDER — INSULIN ASPART PROT & ASPART (70-30 MIX) 100 UNIT/ML ~~LOC~~ SUSP
10.0000 [IU] | Freq: Two times a day (BID) | SUBCUTANEOUS | Status: DC
  Administered 2012-10-23 – 2012-10-24 (×3): 10 [IU] via SUBCUTANEOUS
  Filled 2012-10-23: qty 10

## 2012-10-23 MED ORDER — TORSEMIDE 10 MG PO TABS
10.0000 mg | ORAL_TABLET | Freq: Two times a day (BID) | ORAL | Status: DC
Start: 2012-10-23 — End: 2012-10-23
  Filled 2012-10-23 (×3): qty 1

## 2012-10-23 MED ORDER — PHENYTOIN 125 MG/5ML PO SUSP
400.0000 mg | ORAL | Status: DC
  Filled 2012-10-23 (×3): qty 16

## 2012-10-23 MED ORDER — METOPROLOL TARTRATE 25 MG PO TABS: 25.0000 mg | ORAL_TABLET | Freq: Two times a day (BID) | ORAL | Status: DC

## 2012-10-23 MED ORDER — VANCOMYCIN HCL 10 G IV SOLR
1500.0000 mg | Freq: Once | INTRAVENOUS | Status: AC
  Administered 2012-10-23: 1500 mg via INTRAVENOUS
  Filled 2012-10-23: qty 1500

## 2012-10-23 MED ORDER — PHENYTOIN SODIUM EXTENDED 100 MG PO CAPS
200.0000 mg | ORAL_CAPSULE | Freq: Two times a day (BID) | ORAL | Status: DC
  Administered 2012-10-23: 200 mg via ORAL
  Filled 2012-10-23 (×2): qty 2

## 2012-10-23 MED ORDER — SODIUM CHLORIDE 0.9 % IV BOLUS (SEPSIS)
500.0000 mL | Freq: Once | INTRAVENOUS | Status: AC
  Administered 2012-10-23: 500 mL via INTRAVENOUS

## 2012-10-23 MED ORDER — CIPROFLOXACIN IN D5W 400 MG/200ML IV SOLN
400.0000 mg | INTRAVENOUS | Status: DC
  Administered 2012-10-23 – 2012-10-24 (×2): 400 mg via INTRAVENOUS
  Filled 2012-10-23 (×2): qty 200

## 2012-10-23 MED ORDER — INSULIN ASPART 100 UNIT/ML ~~LOC~~ SOLN
0.0000 [IU] | Freq: Three times a day (TID) | SUBCUTANEOUS | Status: DC
  Administered 2012-10-23: 7 [IU] via SUBCUTANEOUS
  Administered 2012-10-23: 11 [IU] via SUBCUTANEOUS
  Administered 2012-10-24: 7 [IU] via SUBCUTANEOUS
  Administered 2012-10-24 – 2012-10-25 (×2): 11 [IU] via SUBCUTANEOUS
  Administered 2012-10-25: 20 [IU] via SUBCUTANEOUS
  Administered 2012-10-25: 11 [IU] via SUBCUTANEOUS
  Administered 2012-10-26: 4 [IU] via SUBCUTANEOUS
  Administered 2012-10-26: 11 [IU] via SUBCUTANEOUS
  Administered 2012-10-27: 4 [IU] via SUBCUTANEOUS
  Administered 2012-10-27: 11 [IU] via SUBCUTANEOUS
  Administered 2012-10-27: 15 [IU] via SUBCUTANEOUS
  Administered 2012-10-29: 11 [IU] via SUBCUTANEOUS
  Administered 2012-10-29 (×2): 7 [IU] via SUBCUTANEOUS
  Administered 2012-10-30: 4 [IU] via SUBCUTANEOUS
  Administered 2012-10-30 (×2): 7 [IU] via SUBCUTANEOUS
  Administered 2012-10-30 – 2012-10-31 (×3): 4 [IU] via SUBCUTANEOUS
  Administered 2012-11-01: 7 [IU] via SUBCUTANEOUS
  Administered 2012-11-01: 3 [IU] via SUBCUTANEOUS

## 2012-10-23 MED ORDER — TIMOLOL HEMIHYDRATE 0.5 % OP SOLN: 1.0000 [drp] | Freq: Every morning | OPHTHALMIC | Status: DC

## 2012-10-23 MED ORDER — ATORVASTATIN CALCIUM 20 MG PO TABS
20.0000 mg | ORAL_TABLET | Freq: Every day | ORAL | Status: DC
  Administered 2012-10-23 – 2012-11-01 (×9): 20 mg via ORAL
  Filled 2012-10-23 (×11): qty 1

## 2012-10-23 NOTE — H&P (Addendum)
[facility] History and Physical  Adriano Kaba. [id number] DOB: Mar 11, 1946 DOA: 10/22/2012  Referring physician: ED PCP: [name], MD   Chief Complaint: AMS  HPI: [name]. is a 67 y.o. male who presents with an episode of LOC / AMS.  He was apparently on the toilet at home when his wife found him altered.  Patient states he remembers sitting down on the commode then next thing he knew there were people he didn't know in the room (EMS personell).  Patient was slow to respond, he now has no headache, CP, SOB, or focal symptoms initially in the ED.  All of this is occuring the context of recent RLE foot surgery for a foot ulcer and charcot foot repair.  He would later during his course of stay in the ED develop fever, tachypnea, and hypotension in addition to his WBC of 17k seen on initial labs.  He was also noted to have AKI with BUN of 100, his phenytoin level (takes chronically for seizure disorder) was undetectably low.  Hospitalist asked to admit.  Review of Systems: 12 systems reviewed and otherwise negative.  Past Medical History  Diagnosis Date  . Diabetes mellitus   . Hypertension   . Seizures   . Stroke   . Peripheral vascular disease   . CKD (chronic kidney disease)     Stage II (per 06/2010 notes)  . Neuromuscular disorder    Past Surgical History  Procedure Laterality Date  . Colon surgery      partial colectomy  . Eye surgery      bilat cataract surgery  . Below knee leg amputation      L  . Toe amputation      R two  . Amputation  10/10/2011    Procedure: AMPUTATION DIGIT;  Surgeon: Newt Minion, MD;  Location: [place];  Service: Orthopedics;  Laterality: Right;  Right Foot 4th Toe Amputation  . Orif toe fracture Right 10/09/2012    Procedure: OPEN REDUCTION INTERNAL FIXATION (ORIF) METATARSAL (TOE) FRACTURE;  Surgeon: Newt Minion, MD;  Location: [place];  Service: Orthopedics;  Laterality: Right;  Right Foot Base 1st Metatarsal and Medial  Cuneoform Excision, Internal Fixation, Antibiotic Beads   Social History:  reports that he has never smoked. He has never used smokeless tobacco. He reports that he drinks about 0.6 ounces of alcohol per week. He reports that he does not use illicit drugs.  Allergies  Allergen Reactions  . Codeine Anaphylaxis  . Penicillins Anaphylaxis    No family history on file.   Prior to Admission medications   Medication Sig Start Date End Date Taking? Authorizing Provider  aspirin 325 MG tablet Take 325 mg by mouth daily.   Yes Historical Provider, MD  atorvastatin (LIPITOR) 20 MG tablet Take 20 mg by mouth daily.   Yes Historical Provider, MD  insulin aspart protamine-insulin aspart (NOVOLOG 70/30) (70-30) 100 UNIT/ML injection Inject 15 Units into the skin 2 (two) times daily with a meal.    Yes Historical Provider, MD  metoprolol (LOPRESSOR) 50 MG tablet Take 25 mg by mouth 2 (two) times daily.   Yes Historical Provider, MD  olmesartan-hydrochlorothiazide (BENICAR HCT) 20-12.5 MG per tablet Take 1 tablet by mouth every morning.    Yes Historical Provider, MD  phenytoin (DILANTIN) 100 MG ER capsule Take 300 mg by mouth at bedtime.   Yes Historical Provider, MD  timolol (BETIMOL) 0.5 % ophthalmic solution Place 1 drop into  both eyes every morning.    Yes Historical Provider, MD  torsemide (DEMADEX) 20 MG tablet Take 10 mg by mouth 2 (two) times daily.    Yes Historical Provider, MD  traMADol (ULTRAM) 50 MG tablet Take 1 tablet (50 mg total) by mouth every 6 (six) hours as needed for pain. Maximum dose= 8 tablets per day 10/10/12  Yes Newt Minion, MD  Travoprost, BAK Free, ([name]) 0.004 % SOLN ophthalmic solution Place 1 drop into both eyes at bedtime.   Yes Historical Provider, MD   Physical Exam: Filed Vitals:   10/23/12 0430  BP: 106/45  Pulse: 87  Temp:   Resp: 18    General:  In and out of consciousness, somewhat toxic appearing Eyes: PEERLA EOMI ENT: mucous membranes moist Neck:  supple w/o JVD Cardiovascular: RRR w/o MRG Respiratory: CTA B Abdomen: soft, nt, nd, bs+ Skin: RLE wound under bandage Musculoskeletal: MAE, full ROM all 4 extremities Psychiatric: normal tone and affect Neurologic: AAOx3, grossly non-focal  Labs on Admission:  Basic Metabolic Panel:  Recent Labs Lab 10/23/12 0016  NA 131*  K 4.9  CL 104  GLUCOSE 365*  BUN 100*  CREATININE 3.10*   Liver Function Tests: No results found for this basename: AST, ALT, ALKPHOS, BILITOT, PROT, ALBUMIN,  in the last 168 hours No results found for this basename: LIPASE, AMYLASE,  in the last 168 hours No results found for this basename: AMMONIA,  in the last 168 hours CBC:  Recent Labs Lab 10/23/12 0005 10/23/12 0016  WBC 17.0*  --   NEUTROABS 14.7*  --   HGB 7.8* 8.2*  HCT 22.9* 24.0*  MCV 83.0  --   PLT 539*  --    Cardiac Enzymes: No results found for this basename: CKTOTAL, CKMB, CKMBINDEX, TROPONINI,  in the last 168 hours  BNP (last 3 results) No results found for this basename: PROBNP,  in the last 8760 hours CBG:  Recent Labs Lab 10/22/12 2344  GLUCAP 319*    Radiological Exams on Admission: US Renal  10/23/2012   *RADIOLOGY REPORT*  Clinical Data: Acute renal insufficiency.  RENAL/URINARY TRACT ULTRASOUND COMPLETE  Comparison:  None.  Findings:  Right Kidney:  Measures 11.7 cm.  No stone, mass or hydronephrosis. Mildly increased cortical echogenicity is noted.  Left Kidney:  Measures 12.2 cm.  No stone or hydronephrosis. A 0.5 cm hyperechoic focus with posterior shadowing compatible with a stone is noted.  Cortical echogenicity is somewhat increased.  Bladder:  Bilateral ureteral jets are visualized. The prostate gland appears prominent measuring 4.3 x 3.8 x 4.8 cm.  IMPRESSION:  1.  Negative for hydronephrosis or other acute abnormality. 2.  Increased cortical echogenicity of the kidneys compatible with medical renal disease. 3.  0.5 cm nonobstructing stone mid pole left kidney.    Original Report Authenticated By: Orlean Patten, M.D.    EKG: Independently reviewed.  Assessment/Plan Principal Problem:   Severe sepsis with acute organ dysfunction Active Problems:   AKI (acute kidney injury)   Altered mental status   DM2 (diabetes mellitus, type 2)   1. Severe sepsis with acute organ dysfunction - starting cipro and vanc (anaphalaxis to PCN so avoiding beta lactams at this time).  Got 2L bolus, IVF at 125 cc/hr, MAP goal > 65.  Holding HTN meds, holding benicar as nephrotoxic as well.  UA pending, suspicious that source may be his recent foot operation.  Wound care to evaluate further since this is under bandage.  Blood  cultures pending. 2. AKI - holding benicar, likely pre-renal, rehydrating, [place] has ruled out post renal.  Suspect pre renal possibly from severe sepsis.  Strict intake and output. 3. Sub theraputic dilantin level - re-loaded and being dosed by pharm 4. DM - have ordered SSI AC/HS 5. Anemia - HGB 8.3 today down from 12 a month ago, he usually runs in the 8s to 9s, am concerned that this could be due to worsening of underlying renal condition.  FOBT was negative so no evidence of acute blood loss at this time.    Code Status: Full Code (must indicate code status--if unknown or must be presumed, indicate so) Family Communication: Spoke with wife at bedside (indicate person spoken with, if applicable, with phone number if by telephone) Disposition Plan: Admit to SDU inpatient (indicate anticipated LOS)  Time spent: 70 min  Jassica Zazueta M. [facility] Pager [phone]  If 7PM-7AM, please contact night-coverage [email or web address] Password [facility] 10/23/2012, 4:42 AM

## 2012-10-23 NOTE — ED Notes (Signed)
Assisted pt with urinal. Pt sts he feels like he needs to urinate. Pt attempted using urinal by laying on his side without success.

## 2012-10-23 NOTE — Care Management Note (Unsigned)
    Page 1 of 1   11/01/2012     9:53:19 AM   CARE MANAGEMENT NOTE 11/01/2012  Patient:  NASAI, DIVEN   Account Number:  [id number]  Date Initiated:  10/23/2012  Documentation initiated by:  Marvetta Gibbons  Subjective/Objective Assessment:   Pt admitted with sepsis, AKI     Action/Plan:   PTA pt lived at home with spouse- was active with [facility] for [facility] services- HH-PT- will need resumption orders for [facility] -   Anticipated DC Date:  10/26/2012   Anticipated DC Plan:  [place]  CM consult      [facility] Choice  Resumption Of Svcs/PTA Provider   Choice offered to / List presented to:             Status of service:  In process, will continue to follow Medicare Important Message given?   (If response is "NO", the following Medicare IM given date fields will be blank) Date Medicare IM given:   Date Additional Medicare IM given:    Discharge Disposition:    Per UR Regulation:  Reviewed for med. necessity/level of care/duration of stay  If discussed at [place] of Stay Meetings, dates discussed:    Comments:   11-01-12 0950 Jacqlyn Krauss, RN,BSN [phone] CM did touch base with [place] and pt is active with RN for IV ABX and PT services. Will need orders at d/c. Pt is from home with wife to assist with home infusions. CM will continue to monitor for additional disposition needs.  10-30-12 09:54 Pt had prior [facility] and will be probable IV ABX. [facility] aware. Mariane Masters, BSN, [place]

## 2012-10-23 NOTE — Progress Notes (Signed)
ANTIBIOTIC CONSULT NOTE - INITIAL  Pharmacy Consult for vancomycin and cipro Indication: foot infection s/p surgery for osteomyelitis  Allergies  Allergen Reactions  . Codeine Anaphylaxis  . Penicillins Anaphylaxis    Patient Measurements:   Body Weight:  80.3 kg  Vital Signs: Temp: 100.3 F (37.9 C) (08/06 0200) Temp src: Rectal (08/06 0200) BP: 95/38 mmHg (08/06 0400) Pulse Rate: 90 (08/06 0400) Intake/Output from previous day:   Intake/Output from this shift:    Labs:  Recent Labs  10/23/12 0005 10/23/12 0016  WBC 17.0*  --   HGB 7.8* 8.2*  PLT 539*  --   CREATININE  --  3.10*   The CrCl is unknown because both a height and weight (above a minimum accepted value) are required for this calculation. No results found for this basename: VANCOTROUGH, VANCOPEAK, VANCORANDOM, GENTTROUGH, GENTPEAK, GENTRANDOM, TOBRATROUGH, TOBRAPEAK, TOBRARND, AMIKACINPEAK, AMIKACINTROU, AMIKACIN,  in the last 72 hours   Microbiology: No results found for this or any previous visit (from the past 720 hour(s)).  Medical History: Past Medical History  Diagnosis Date  . Diabetes mellitus   . Hypertension   . Seizures   . Stroke   . Peripheral vascular disease   . CKD (chronic kidney disease)     Stage II (per 06/2010 notes)  . Neuromuscular disorder     Medications:   (Not in a hospital admission) Assessment: 67 yo man who underwent surgery 7/23 for osteomyelitis right foot.  He will start broad spectrum antibiotics.  His SrCr is 3.10.  Goal of Therapy:  Vancomycin trough level 15-20 mcg/ml  Plan:  Cipro 400 mg IV q24 hours. Vanc 1500 mg IV X 1 then 1 gm IV q48 hours. F/u renal function, clinical course and cultures.  [name] 10/23/2012,4:28 AM

## 2012-10-23 NOTE — Progress Notes (Signed)
Utilization review completed.  

## 2012-10-23 NOTE — ED Notes (Signed)
Hospitalist at bedside 

## 2012-10-23 NOTE — Progress Notes (Addendum)
MEDICATION RELATED CONSULT NOTE - INITIAL   Pharmacy Consult for phenytoin Indication: seizure  Allergies  Allergen Reactions  . Codeine Anaphylaxis  . Penicillins Anaphylaxis    Patient Measurements:   Body Weight: 80.3 kg  Vital Signs: Temp: 100.3 F (37.9 C) (08/06 0200) Temp src: Rectal (08/06 0200) BP: 95/38 mmHg (08/06 0400) Pulse Rate: 90 (08/06 0400) Intake/Output from previous day:   Intake/Output from this shift:    Labs:  Recent Labs  10/23/12 0005 10/23/12 0016  WBC 17.0*  --   HGB 7.8* 8.2*  HCT 22.9* 24.0*  PLT 539*  --   CREATININE  --  3.10*   The CrCl is unknown because both a height and weight (above a minimum accepted value) are required for this calculation.   Microbiology: No results found for this or any previous visit (from the past 720 hour(s)).  Medical History: Past Medical History  Diagnosis Date  . Diabetes mellitus   . Hypertension   . Seizures   . Stroke   . Peripheral vascular disease   . CKD (chronic kidney disease)     Stage II (per 06/2010 notes)  . Neuromuscular disorder     Medications:   (Not in a hospital admission)  Assessment: 67 yo man to receive dilantin for h/o seizures.  His initial level is <2.5 mg/L  He received a loading dose of 1000 mg in the ED.  Goal of Therapy:  Phenytoin level 15-20 mg/L  Plan:  Check level with am labs to assure in goal range. Increase maintenance dose to 400 mg total daily.   Shavell Nored Poteet 10/23/2012,4:17 AM

## 2012-10-23 NOTE — Progress Notes (Signed)
Patient admitted from ER via stretcher with ER RN. Patient alert and oriented x 4, family at bedside. Patient oriented to unit and room, instructed on callbell and placed at side. Will continue to monitor.

## 2012-10-23 NOTE — Progress Notes (Signed)
[facility] Progress Note [place] TEAM 1 - Stepdown/ICU TEAM   [name]. [id number] DOB: 1945-10-15 DOA: 10/22/2012 PCP: [name], MD  Brief narrative: 67 y.o. male who presented with an episode of LOC / AMS. He was apparently on the toilet at home when his wife found him altered. Patient stated he remembered sitting down on the commode then next thing he knew there were people he didn't know in the room (EMS personell). Patient was slow to respond.  He had no headache, CP, SOB, or focal symptoms. Pt is s/p recent RLE foot surgery for a foot ulcer and charcot foot repair. Later during his course of stay in the ED the pt developed fever, tachypnea, and hypotension in addition to his WBC of 17k. He was also noted to have AKI with BUN of 100.  Assessment/Plan:  SIRS v/s sepsis  Unclear source of infection - suspected bacteremia w/ foot wound source of entry - UA negative   Toxic metabolic encephalopathy/uremia  Acute on chronic anemia - ?acute blood loss Hgb 12.17 September 2012 - 9.23 September 2011 - Hgb nadir of 6.4 thus far -   anaphalaxis to PCN   AKI on CKD  Baseline GFR ~45 as of July 2014 and July 2013 - currently GFR ~25   DM2 - uncontrolled  Hx of HTN  Sz d/o  Code Status: FULL Family Communication: spoke w/ wife and 2 daughters at beside Disposition Plan: SDU  Consultants: none  Procedures: none  Antibiotics: Cipro 8/5 >> Vanc 8/5 >> 8/6  DVT prophylaxis: SCD only until clear if pt bleeding  HPI/Subjective: Pt seen for f/u visit  Objective: Blood pressure 133/71, pulse 92, temperature 98.8 F (37.1 C), temperature source Oral, resp. rate 34, height 5\' 11"  (1.803 m), weight 81 kg (178 lb 9.2 oz), SpO2 96.00%.  Intake/Output Summary (Last 24 hours) at 10/23/12 1138 Last data filed at 10/23/12 1055  Gross per 24 hour  Intake   2253 ml  Output    700 ml  Net   1553 ml    Exam: F/U exam completed  Data Reviewed: Basic Metabolic  Panel:  Recent Labs Lab 10/23/12 0016 10/23/12 0455  NA 131* 132*  K 4.9 4.7  CL 104 105  CO2  --  15*  GLUCOSE 365* 309*  BUN 100* 96*  CREATININE 3.10* 2.61*  CALCIUM  --  8.2*   Liver Function Tests: No results found for this basename: AST, ALT, ALKPHOS, BILITOT, PROT, ALBUMIN,  in the last 168 hours No results found for this basename: LIPASE, AMYLASE,  in the last 168 hours No results found for this basename: AMMONIA,  in the last 168 hours CBC:  Recent Labs Lab 10/23/12 0005 10/23/12 0016 10/23/12 0455  WBC 17.0*  --  15.9*  NEUTROABS 14.7*  --   --   HGB 7.8* 8.2* 6.4*  HCT 22.9* 24.0* 19.0*  MCV 83.0  --  84.1  PLT 539*  --  476*   CBG:  Recent Labs Lab 10/22/12 2344 10/23/12 0744  GLUCAP 319* 312*    Recent Results (from the past 240 hour(s))  MRSA PCR SCREENING     Status: None   Collection Time    10/23/12  5:41 AM      Result Value Range Status   MRSA by PCR NEGATIVE  NEGATIVE Final   Comment:            The GeneXpert MRSA Assay (FDA  approved for NASAL specimens     only), is one component of a     comprehensive MRSA colonization     surveillance program. It is not     intended to diagnose MRSA     infection nor to guide or     monitor treatment for     MRSA infections.     Studies:  Recent x-ray studies have been reviewed in detail by the Attending Physician  Scheduled Meds:  Scheduled Meds: . aspirin  325 mg Oral Daily  . atorvastatin  20 mg Oral Daily  . ciprofloxacin  400 mg Intravenous Q24H  . heparin  5,000 Units Subcutaneous Q8H  . insulin aspart  0-15 Units Subcutaneous TID WC  . phenytoin  200 mg Oral BID  . sodium chloride  3 mL Intravenous Q12H  . timolol  1 drop Both Eyes Daily  . torsemide  10 mg Oral BID  . Travoprost (BAK Free)  1 drop Both Eyes QHS  . [START ON 10/25/2012] vancomycin  1,000 mg Intravenous Q48H    Time spent on care of this patient: 25+ mins   [place]   [phone] Pager - Text Page per Shea Evans as per below:  On-Call/Text Page:      [email or web address]      password [email or web address]  If 7PM-7AM, please contact night-coverage [email or web address] Password [email or web address] 10/23/2012, 11:38 AM   LOS: 1 day

## 2012-10-23 NOTE — ED Notes (Signed)
Patient transported to Ultrasound 

## 2012-10-23 NOTE — Progress Notes (Addendum)
Inpatient Diabetes Program Recommendations  AACE/ADA: New Consensus Statement on Inpatient Glycemic Control (2013)  Target Ranges:  Prepandial:   less than 140 mg/dL      Peak postprandial:   less than 180 mg/dL (1-2 hours)      Critically ill patients:  140 - 180 mg/dL     Results for BOW, ADER (MRN [id number]) as of 10/23/2012 11:03  Ref. Range 10/22/2012 23:44 10/23/2012 07:44  Glucose-Capillary Latest Range: 70-99 mg/dL 319 (H) 312 (H)    **Per records, patient takes the following insulin at home: 70/30 insulin- 15 units bid with meals  **MD- Please start home dose of 70/30 insulin if patient eating adequately (if patient not eating well, please start Levemir insulin 20 units QHS instead)   Will follow. Wyn Quaker RN, MSN, CDE Diabetes Coordinator Inpatient Diabetes Program [phone]

## 2012-10-23 NOTE — ED Notes (Signed)
Patient bed placement changed by admitting MD from tele to step down unit d/t high WBC, fever and hypotension. 6E notified. Bed control notified.

## 2012-10-23 NOTE — Progress Notes (Signed)
CRITICAL VALUE ALERT  Critical value received:  Hbg 6.4  Date of notification:  10/23/12  Time of notification:  0549  Critical value read back:yes  Nurse who received alert:  D. Aleene Davidson, RN  MD notified (1st page):  Tylene Fantasia, NP  Time of first page:  0605  MD notified (2nd page):  Time of second page:  Responding MD:  Tylene Fantasia, NP  Time MD responded:  [phone]

## 2012-10-23 NOTE — Consult Note (Signed)
WOC consult Note Reason for Consult: evaluation of surgical wound right foot. Pt S/P orthopedic surgery per Dr. [name] (10/09/12). Wife reports they have had one follow up and they were scheduled to see him tom. Wound type:surgical Measurement: suture line along the medial dorsal right foot, intact with one open area that is somewhat macerated and draining Wound bed: small opening along the suture line Drainage (amount, consistency, odor) moderate serosanguinous on existing dressing placed on Sunday per wife. Periwound: intact Dressing procedure/placement/frequency: add silver hydrofiber on the open draining area, cover with dry dressing, secure with kerlix and ACE.   To follow up with Dr. [name] again after discharge.  Discussed POC with patient and bedside nurse.  Re consult if needed, will not follow at this time. Thanks  Michalle Rademaker [place], [place] [phone])

## 2012-10-23 NOTE — ED Notes (Signed)
MD hospitalist at bedside.

## 2012-10-24 LAB — TYPE AND SCREEN
ABO/RH(D): O POS
Antibody Screen: NEGATIVE
Unit division: 0
Unit division: 0

## 2012-10-24 LAB — RENAL FUNCTION PANEL
Albumin: 1.9 g/dL — ABNORMAL LOW (ref 3.5–5.2)
BUN: 59 mg/dL — ABNORMAL HIGH (ref 6–23)
Calcium: 9.1 mg/dL (ref 8.4–10.5)
Glucose, Bld: 205 mg/dL — ABNORMAL HIGH (ref 70–99)
Phosphorus: 3 mg/dL (ref 2.3–4.6)
Sodium: 138 mEq/L (ref 135–145)

## 2012-10-24 LAB — GLUCOSE, CAPILLARY

## 2012-10-24 LAB — CBC
HCT: 26.3 % — ABNORMAL LOW (ref 39.0–52.0)
Hemoglobin: 9.1 g/dL — ABNORMAL LOW (ref 13.0–17.0)
MCH: 29.5 pg (ref 26.0–34.0)
MCHC: 34.6 g/dL (ref 30.0–36.0)
RDW: 13.6 % (ref 11.5–15.5)

## 2012-10-24 MED ORDER — CIPROFLOXACIN IN D5W 400 MG/200ML IV SOLN
400.0000 mg | Freq: Two times a day (BID) | INTRAVENOUS | Status: DC
  Administered 2012-10-24 – 2012-11-01 (×16): 400 mg via INTRAVENOUS
  Filled 2012-10-24 (×20): qty 200

## 2012-10-24 MED ORDER — INSULIN ASPART PROT & ASPART (70-30 MIX) 100 UNIT/ML ~~LOC~~ SUSP
15.0000 [IU] | Freq: Two times a day (BID) | SUBCUTANEOUS | Status: DC
  Administered 2012-10-25 (×2): 15 [IU] via SUBCUTANEOUS
  Filled 2012-10-24: qty 10

## 2012-10-24 NOTE — Progress Notes (Addendum)
[facility] Progress Note [place] TEAM 1 - Stepdown/ICU TEAM   [name]. WI:[id number] DOB: 05-12-1945 DOA: 10/22/2012 PCP: [name], MD  Brief narrative: 67 y.o. male who presented with an episode of LOC / AMS. He was apparently on the toilet at home when his wife found him altered. Patient stated he remembered sitting down on the commode then next thing he knew there were people he didn't know in the room (EMS personell). Patient was slow to respond.  He had no headache, CP, SOB, or focal symptoms. Pt is s/p recent RLE foot surgery for a foot ulcer and charcot foot repair. Later during his course of stay in the ED the pt developed fever, tachypnea, and hypotension in addition to his WBC of 17k. He was also noted to have AKI with BUN of 100.  Assessment/Plan:  SIRS v/s sepsis  Unclear source of infection -   - UA negative, blood cultures remain negative - per wound care, wound is not infected - fevers only low grade 100.3 and [id number]  Toxic metabolic encephalopathy/uremia and syncope at home - resolved- possibly due to anemia in addition to uremia  Acute on chronic anemia - ?acute blood loss Hgb 12.17 September 2012 - 9.23 September 2011 - Hgb nadir of 6.4 thus far -  Pt states he has been having black stools at home but noted to be brown last night F/u stool hemoccult  anaphalaxis to PCN   AKI on CKD  Baseline GFR ~45 as of July 2014 and July 2013 - - now back to baseline after fluid and blood infused  DM2 - uncontrolled - increase 70/30 back to 15 BID  Hx of HTN - follow off meds for now  Sz d/o - Dilantin re-bolused yesterday - may need to increase home dose  Code Status: FULL Family Communication: spoke w/ wife  Disposition Plan: SDU  Consultants: none  Procedures: none  Antibiotics: Cipro 8/5 >> Vanc 8/5 >> 8/6  DVT prophylaxis: SCD only until clear if pt bleeding  HPI/Subjective: Pt states he has been passing black stools at home-    Objective: Blood pressure 140/64, pulse 90, temperature 99.6 F (37.6 C), temperature source Oral, resp. rate 19, height 5\' 11"  (1.803 m), weight 81 kg (178 lb 9.2 oz), SpO2 100.00%.  Intake/Output Summary (Last 24 hours) at 10/24/12 1827 Last data filed at 10/24/12 1300  Gross per 24 hour  Intake   2680 ml  Output   1450 ml  Net   1230 ml    Exam: General: AAO x 3 Cardiovascular: RRR w/o MRG  Respiratory: CTA B  Abdomen: soft, nt, nd, bs+  Skin: RLE wound under bandage  Neurologic: AAOx3, grossly non-focal EXt: left BKA  Data Reviewed: Basic Metabolic Panel:  Recent Labs Lab 10/23/12 0016 10/23/12 0455 10/24/12 0500  NA 131* 132* 138  K 4.9 4.7 4.9  CL 104 105 109  CO2  --  15* 17*  GLUCOSE 365* 309* 205*  BUN 100* 96* 59*  CREATININE 3.10* 2.61* 1.66*  CALCIUM  --  8.2* 9.1  PHOS  --   --  3.0   Liver Function Tests:  Recent Labs Lab 10/23/12 1925 10/24/12 0500  ALBUMIN 1.7* 1.9*   No results found for this basename: LIPASE, AMYLASE,  in the last 168 hours No results found for this basename: AMMONIA,  in the last 168 hours CBC:  Recent Labs Lab 10/23/12 0005 10/23/12 0016 10/23/12 0455 10/23/12 1929 10/24/12 0500  WBC 17.0*  --  15.9* 15.9* 18.1*  NEUTROABS 14.7*  --   --   --   --   HGB 7.8* 8.2* 6.4* 8.4* 9.1*  HCT 22.9* 24.0* 19.0* 24.2* 26.3*  MCV 83.0  --  84.1 84.3 85.4  PLT 539*  --  476* 451* 494*   CBG:  Recent Labs Lab 10/23/12 1719 10/23/12 2134 10/24/12 0812 10/24/12 1320 10/24/12 1719  GLUCAP 243* 164* 204* 300* 295*    Recent Results (from the past 240 hour(s))  CULTURE, BLOOD (ROUTINE X 2)     Status: None   Collection Time    10/23/12  4:15 AM      Result Value Range Status   Specimen Description BLOOD LEFT ARM   Final   Special Requests BOTTLES DRAWN AEROBIC AND ANAEROBIC 10CC EACH   Final   Culture  Setup Time     Final   Value: 10/23/2012 10:20     Performed at [facility]   Culture     Final    Value:        BLOOD CULTURE RECEIVED NO GROWTH TO DATE CULTURE WILL BE HELD FOR 5 DAYS BEFORE ISSUING A FINAL NEGATIVE REPORT     Performed at [facility]   Report Status PENDING   Incomplete  CULTURE, BLOOD (ROUTINE X 2)     Status: None   Collection Time    10/23/12  4:15 AM      Result Value Range Status   Specimen Description BLOOD LEFT HAND   Final   Special Requests BOTTLES DRAWN AEROBIC ONLY 10CC   Final   Culture  Setup Time     Final   Value: 10/23/2012 10:19     Performed at [facility]   Culture     Final   Value:        BLOOD CULTURE RECEIVED NO GROWTH TO DATE CULTURE WILL BE HELD FOR 5 DAYS BEFORE ISSUING A FINAL NEGATIVE REPORT     Performed at [facility]   Report Status PENDING   Incomplete  MRSA PCR SCREENING     Status: None   Collection Time    10/23/12  5:41 AM      Result Value Range Status   MRSA by PCR NEGATIVE  NEGATIVE Final   Comment:            The GeneXpert MRSA Assay (FDA     approved for NASAL specimens     only), is one component of a     comprehensive MRSA colonization     surveillance program. It is not     intended to diagnose MRSA     infection nor to guide or     monitor treatment for     MRSA infections.     Studies:  Recent x-ray studies have been reviewed in detail by the Attending Physician  Scheduled Meds:  Scheduled Meds: . atorvastatin  20 mg Oral Daily  . ciprofloxacin  400 mg Intravenous Q12H  . insulin aspart  0-20 Units Subcutaneous TID WC  . insulin aspart  0-5 Units Subcutaneous QHS  . insulin aspart protamine- aspart  10 Units Subcutaneous BID WC  . phenytoin  200 mg Oral BID  . senna-docusate  1 tablet Oral BID  . sodium chloride  3 mL Intravenous Q12H  . timolol  1 drop Both Eyes Daily  . Travoprost (BAK Free)  1 drop Both Eyes QHS  Time spent on care of this patient: 25+ mins   Debbe Odea, MD  [facility] Office  [phone] Pager - Text Page per Amion as per  below:  On-Call/Text Page:      [email or web address]      password [email or web address]  If 7PM-7AM, please contact night-coverage [email or web address] Password [facility] 10/24/2012, 6:27 PM   LOS: 2 days

## 2012-10-24 NOTE — Progress Notes (Signed)
Inpatient Diabetes Program Recommendations  AACE/ADA: New Consensus Statement on Inpatient Glycemic Control (2013)  Target Ranges:  Prepandial:   less than 140 mg/dL      Peak postprandial:   less than 180 mg/dL (1-2 hours)      Critically ill patients:  140 - 180 mg/dL  Results for [name], [name] (MRN [id number]) as of 10/24/2012 11:12  Ref. Range 10/23/2012 07:44 10/23/2012 11:24 10/23/2012 17:19 10/23/2012 21:34 10/24/2012 08:12  Glucose-Capillary Latest Range: 70-99 mg/dL 312 (H) 284 (H) 243 (H) 164 (H) 204 (H)   Inpatient Diabetes Program Recommendations Insulin - Basal: consider increase 70/30 to 15 BID (home dose) Thank you  Raoul Pitch BSN, RN,CDE Inpatient Diabetes Coordinator [phone] (team pager)

## 2012-10-24 NOTE — Progress Notes (Signed)
PHARMACY CONSULT NOTE - FOLLOW UP  Pharmacy Consult for CIPRO and PHENYTOIN Indication: foot infection s/p surgery for osteomyelitis AND h/o seizures  Allergies  Allergen Reactions  . Codeine Anaphylaxis  . Penicillins Anaphylaxis    Patient Measurements: Height: 5\' 11"  (180.3 cm) Weight: 178 lb 9.2 oz (81 kg) IBW/kg (Calculated) : 75.3   Vital Signs: Temp: 98.6 F (37 C) (08/07 1136) Temp src: Oral (08/07 1136) BP: 122/49 mmHg (08/07 0815) Pulse Rate: 90 (08/07 0815) Intake/Output from previous day: 08/06 0701 - 08/07 0700 In: 2957 [P.O.:100; I.V.:2028; Blood:629; IV Piggyback:200] Out: 2050 [Urine:2050] Intake/Output from this shift: Total I/O In: -  Out: 250 [Urine:250]  Labs:  Recent Labs  10/23/12 0016 10/23/12 0455 10/23/12 1929 10/24/12 0500  WBC  --  15.9* 15.9* 18.1*  HGB 8.2* 6.4* 8.4* 9.1*  PLT  --  476* 451* 494*  CREATININE 3.10* 2.61*  --  1.66*   Estimated Creatinine Clearance: 47.3 ml/min (by C-G formula based on Cr of 1.66). No results found for this basename: VANCOTROUGH, VANCOPEAK, VANCORANDOM, GENTTROUGH, GENTPEAK, GENTRANDOM, TOBRATROUGH, TOBRAPEAK, TOBRARND, AMIKACINPEAK, AMIKACINTROU, AMIKACIN,  in the last 72 hours   Microbiology: Recent Results (from the past 720 hour(s))  CULTURE, BLOOD (ROUTINE X 2)     Status: None   Collection Time    10/23/12  4:15 AM      Result Value Range Status   Specimen Description BLOOD LEFT ARM   Final   Special Requests BOTTLES DRAWN AEROBIC AND ANAEROBIC 10CC EACH   Final   Culture  Setup Time     Final   Value: 10/23/2012 10:20     Performed at [facility]   Culture     Final   Value:        BLOOD CULTURE RECEIVED NO GROWTH TO DATE CULTURE WILL BE HELD FOR 5 DAYS BEFORE ISSUING A FINAL NEGATIVE REPORT     Performed at [facility]   Report Status PENDING   Incomplete  CULTURE, BLOOD (ROUTINE X 2)     Status: None   Collection Time    10/23/12  4:15 AM      Result Value Range  Status   Specimen Description BLOOD LEFT HAND   Final   Special Requests BOTTLES DRAWN AEROBIC ONLY 10CC   Final   Culture  Setup Time     Final   Value: 10/23/2012 10:19     Performed at [facility]   Culture     Final   Value:        BLOOD CULTURE RECEIVED NO GROWTH TO DATE CULTURE WILL BE HELD FOR 5 DAYS BEFORE ISSUING A FINAL NEGATIVE REPORT     Performed at [facility]   Report Status PENDING   Incomplete  MRSA PCR SCREENING     Status: None   Collection Time    10/23/12  5:41 AM      Result Value Range Status   MRSA by PCR NEGATIVE  NEGATIVE Final   Comment:            The GeneXpert MRSA Assay (FDA     approved for NASAL specimens     only), is one component of a     comprehensive MRSA colonization     surveillance program. It is not     intended to diagnose MRSA     infection nor to guide or     monitor treatment for     MRSA infections.  Anti-infectives   Start     Dose/Rate Route Frequency Ordered Stop   10/25/12 0500  vancomycin (VANCOCIN) IVPB 1000 mg/200 mL premix  Status:  Discontinued     1,000 mg 200 mL/hr over 60 Minutes Intravenous Every 48 hours 10/23/12 0439 10/23/12 1156   10/24/12 1800  ciprofloxacin (CIPRO) IVPB 400 mg     400 mg 200 mL/hr over 60 Minutes Intravenous Every 12 hours 10/24/12 1310     10/23/12 0500  ciprofloxacin (CIPRO) IVPB 400 mg  Status:  Discontinued     400 mg 200 mL/hr over 60 Minutes Intravenous Every 24 hours 10/23/12 0439 10/24/12 1310   10/23/12 0500  vancomycin (VANCOCIN) 1,500 mg in sodium chloride 0.9 % 500 mL IVPB     1,500 mg 250 mL/hr over 120 Minutes Intravenous  Once 10/23/12 0439 10/23/12 0655      Assessment: 67 yo M admitted with AMS.  On DPH 300 mg po QHS at home.    SCr much improved today at 1.7>>CrCl ~50 ml/min.  DPH level <2.5 on admit, reloaded with 1000 mg IV in ED 8/6, then increase to 200 mg BID; 8/6: DPH = 5.8, Alb 1.7>>corr DPH level=13   Goal of Therapy: eradication of  infection; phenytoin level 10-20 mg/L  Plan:  - Increase cipro to 400 mg IV BID - Continue phenytoin 200 mg po BID - Follow up SCr, UOP, cultures, clinical course and adjust as clinically indicated - Plan for DPH level 5-7 days after dose change  Deztinee Lohmeyer L. Amada Jupiter, PharmD, [place] Clinical Pharmacist Pager: [phone] Pharmacy: [phone] 10/24/2012 1:17 PM

## 2012-10-24 NOTE — Progress Notes (Signed)
[facility]  Patient Status: Active (receiving services up to time of hospitalization)  [facility] is providing the following services: PT  If patient discharges after hours, please call [phone].   Janae Sauce 10/24/2012, 10:37 AM

## 2012-10-25 LAB — GLUCOSE, CAPILLARY
Glucose-Capillary: 356 mg/dL — ABNORMAL HIGH (ref 70–99)
Glucose-Capillary: 259 mg/dL — ABNORMAL HIGH (ref 70–99)
Glucose-Capillary: 186 mg/dL — ABNORMAL HIGH (ref 70–99)

## 2012-10-25 LAB — CBC
HCT: 25.7 % — ABNORMAL LOW (ref 39.0–52.0)
HCT: 24.8 % — ABNORMAL LOW (ref 39.0–52.0)
MCHC: 33.9 g/dL (ref 30.0–36.0)
MCV: 86.5 fL (ref 78.0–100.0)
MCV: 85.8 fL (ref 78.0–100.0)
RDW: 14 % (ref 11.5–15.5)
RDW: 13.7 % (ref 11.5–15.5)
WBC: 18.7 10*3/uL — ABNORMAL HIGH (ref 4.0–10.5)

## 2012-10-25 LAB — IRON AND TIBC
Iron: <10 ug/dL — ABNORMAL LOW (ref 42–135)
UIBC: 98 ug/dL — ABNORMAL LOW (ref 125–400)

## 2012-10-25 LAB — FERRITIN: Ferritin: 1101 ng/mL — ABNORMAL HIGH (ref 22–322)

## 2012-10-25 LAB — RETICULOCYTES: Retic Count, Absolute: 63.6 10*3/uL (ref 19.0–186.0)

## 2012-10-25 LAB — BASIC METABOLIC PANEL
BUN: 34 mg/dL — ABNORMAL HIGH (ref 6–23)
Chloride: 111 mEq/L (ref 96–112)
Creatinine, Ser: 1.47 mg/dL — ABNORMAL HIGH (ref 0.50–1.35)
GFR calc Af Amer: 56 mL/min — ABNORMAL LOW (ref >90)
GFR calc non Af Amer: 48 mL/min — ABNORMAL LOW (ref >90)

## 2012-10-25 MED ORDER — POLYETHYLENE GLYCOL 3350 17 G PO PACK
17.0000 g | PACK | Freq: Every day | ORAL | Status: DC
  Administered 2012-10-30: 17 g via ORAL
  Filled 2012-10-25 (×8): qty 1

## 2012-10-25 MED ORDER — METOPROLOL TARTRATE 25 MG PO TABS
25.0000 mg | ORAL_TABLET | Freq: Two times a day (BID) | ORAL | Status: DC
  Administered 2012-10-25 – 2012-11-01 (×15): 25 mg via ORAL
  Filled 2012-10-25 (×18): qty 1

## 2012-10-25 MED ORDER — VANCOMYCIN HCL IN DEXTROSE 750-5 MG/150ML-% IV SOLN
750.0000 mg | Freq: Two times a day (BID) | INTRAVENOUS | Status: DC
  Administered 2012-10-25 – 2012-11-01 (×14): 750 mg via INTRAVENOUS
  Filled 2012-10-25 (×18): qty 150

## 2012-10-25 MED ORDER — MAGNESIUM HYDROXIDE 400 MG/5ML PO SUSP: 15.0000 mL | Freq: Every day | ORAL | Status: DC | PRN

## 2012-10-25 MED ORDER — INSULIN ASPART PROT & ASPART (70-30 MIX) 100 UNIT/ML ~~LOC~~ SUSP
20.0000 [IU] | Freq: Two times a day (BID) | SUBCUTANEOUS | Status: DC
  Administered 2012-10-26 – 2012-10-29 (×6): 20 [IU] via SUBCUTANEOUS
  Filled 2012-10-25: qty 10

## 2012-10-25 NOTE — Progress Notes (Signed)
ANTIBIOTIC CONSULT NOTE - INITIAL  Pharmacy Consult for Vancomycin Indication: R-foot infection s/p surgery  Allergies  Allergen Reactions  . Codeine Anaphylaxis  . Penicillins Anaphylaxis    Patient Measurements: Height: 5\' 11"  (180.3 cm) Weight: 178 lb 9.2 oz (81 kg) IBW/kg (Calculated) : 75.3  Vital Signs: Temp: 99.4 F (37.4 C) (08/08 0815) Temp src: Oral (08/08 0815) BP: 152/55 mmHg (08/08 0357) Pulse Rate: 92 (08/08 0357) Intake/Output from previous day: 08/07 0701 - 08/08 0700 In: 2400 [P.O.:600; I.V.:1800] Out: 800 [Urine:800] Labs:  Recent Labs  10/23/12 0455 10/23/12 1929 10/24/12 0500 10/25/12 0650  WBC 15.9* 15.9* 18.1* 18.7*  HGB 6.4* 8.4* 9.1* 8.7*  PLT 476* 451* 494* 462*  CREATININE 2.61*  --  1.66* 1.47*   Estimated Creatinine Clearance: 53.4 ml/min (by C-G formula based on Cr of 1.47). No results found for this basename: VANCOTROUGH, VANCOPEAK, VANCORANDOM, GENTTROUGH, GENTPEAK, GENTRANDOM, TOBRATROUGH, TOBRAPEAK, TOBRARND, AMIKACINPEAK, AMIKACINTROU, AMIKACIN,  in the last 72 hours   Microbiology: Recent Results (from the past 720 hour(s))  CULTURE, BLOOD (ROUTINE X 2)     Status: None   Collection Time    10/23/12  4:15 AM      Result Value Range Status   Specimen Description BLOOD LEFT ARM   Final   Special Requests BOTTLES DRAWN AEROBIC AND ANAEROBIC 10CC EACH   Final   Culture  Setup Time     Final   Value: 10/23/2012 10:20     Performed at [facility]   Culture     Final   Value:        BLOOD CULTURE RECEIVED NO GROWTH TO DATE CULTURE WILL BE HELD FOR 5 DAYS BEFORE ISSUING A FINAL NEGATIVE REPORT     Performed at [facility]   Report Status PENDING   Incomplete  CULTURE, BLOOD (ROUTINE X 2)     Status: None   Collection Time    10/23/12  4:15 AM      Result Value Range Status   Specimen Description BLOOD LEFT HAND   Final   Special Requests BOTTLES DRAWN AEROBIC ONLY 10CC   Final   Culture  Setup Time     Final    Value: 10/23/2012 10:19     Performed at [facility]   Culture     Final   Value:        BLOOD CULTURE RECEIVED NO GROWTH TO DATE CULTURE WILL BE HELD FOR 5 DAYS BEFORE ISSUING A FINAL NEGATIVE REPORT     Performed at [facility]   Report Status PENDING   Incomplete  MRSA PCR SCREENING     Status: None   Collection Time    10/23/12  5:41 AM      Result Value Range Status   MRSA by PCR NEGATIVE  NEGATIVE Final   Comment:            The GeneXpert MRSA Assay (FDA     approved for NASAL specimens     only), is one component of a     comprehensive MRSA colonization     surveillance program. It is not     intended to diagnose MRSA     infection nor to guide or     monitor treatment for     MRSA infections.    Medical History: Past Medical History  Diagnosis Date  . Diabetes mellitus   . Hypertension   . Seizures   . Stroke   .  Peripheral vascular disease   . CKD (chronic kidney disease)     Stage II (per 06/2010 notes)  . Neuromuscular disorder     Assessment: [name] s/p internal fixation of right foot with warmth and small drainage from surgical incision. To start antibiotics and ortho to re-evaluate Monday morning. Already on cipro, to start vancomycin per pharmacy. WBC 18.7, Scr 1.47<1.66 with CrCl 50-55, Tmax 100. Per ortho note, pt did have exposed bone intraoperatively.  Goal of Therapy:  Vancomycin trough level 15-20 mcg/ml  Plan:  -Vancomycin 750 mg IV q12h -Continue cipro at current dose -Trend WBC, temp, renal function -F/U ortho plans  Thank you for allowing me to take part in this patient's care,  Narda Bonds, PharmD Clinical Pharmacist Phone: [phone] Pager: [phone] 10/25/2012 9:41 AM

## 2012-10-25 NOTE — Progress Notes (Signed)
[facility] Progress Note [place] TEAM 1 - Stepdown/ICU TEAM   [name]. [id number] DOB: 1945/12/04 DOA: 10/22/2012 PCP: [name], MD  Brief narrative: 67 y.o. male who presented with an episode of LOC / AMS. He was apparently on the toilet at home when his wife found him altered. Patient stated he remembered sitting down on the commode then next thing he knew there were people he didn't know in the room (EMS personell). Patient was slow to respond.  He had no headache, CP, SOB, or focal symptoms. Pt is s/p recent RLE foot surgery for a foot ulcer and charcot foot repair. Later during his course of stay in the ED the pt developed fever, tachypnea, and hypotension in addition to his WBC of 17k. He was also noted to have AKI with BUN of 100.  Assessment/Plan:  SIRS v/s sepsis - possible R foot surgical wound infection  Unclear source of infection - UA negative - blood cultures remain negative - Tmax only 100.7, but WBC remain markedly elevated - will tx as wound infection as discussed w/ Ortho - pt may require return to OR Monday  Toxic metabolic encephalopathy/uremia Resolved - due to above + uremia   Acute on chronic anemia - ?acute blood loss Hgb 12.17 September 2012 - 9.23 September 2011 - Hgb nadir of 6.4 thus far - pt stated he had been having black stools at home but noted to be brown while in hospital - Hgb may be slowly trending back down - continue to follow - check Fe studies - waiting on guiac stool   anaphalaxis to PCN   AKI on CKD  Baseline GFR ~45 as of July 2014 and July 2013 - back to baseline after fluid and blood infused   DM2 - uncontrolled CBG still poorly controlled - home regimen resumed - adjust further and follow  Hx of HTN No change in tx plan today - follow   Sz d/o Dilantin dosing being adjusted by Rx  Code Status: FULL Family Communication: no family present at time of exam today  Disposition Plan: stable for transfer to medical bed -  adding vancomycin back to tx course, and following WBC/clincial exam - if not improved by Monday, Dr. [name] to take pt to OR for I&D  Consultants: Ortho - Dr. [name]  Procedures: none  Antibiotics: Cipro 8/5 >> Vanc 8/5 >> 8/6 + 8/8 >>  DVT prophylaxis: SCD only until clear if pt bleeding  HPI/Subjective: Resting comfortably in bed. Has not complaints today.  Denies pain in his foot.  No cp, sob, n/v, or abdom pain.    Objective: Blood pressure 152/55, pulse 92, temperature 99.4 F (37.4 C), temperature source Oral, resp. rate 14, height 5\' 11"  (1.803 m), weight 81 kg (178 lb 9.2 oz), SpO2 100.00%.  Intake/Output Summary (Last 24 hours) at 10/25/12 0816 Last data filed at 10/25/12 0816  Gross per 24 hour  Intake   2160 ml  Output    550 ml  Net   1610 ml    Exam: General: No acute respiratory distress Lungs: Clear to auscultation bilaterally without wheezes or crackles Cardiovascular: Regular rate and rhythm without murmur gallop or rub normal S1 and S2 Abdomen: Nontender, nondistended, soft, bowel sounds positive, no rebound, no ascites, no appreciable mass Extremities: No significant cyanosis, clubbing, or edema bilateral lower extremities - R foot dressed and dry   Data Reviewed: Basic Metabolic Panel:  Recent Labs Lab 10/23/12 0016 10/23/12 0455 10/24/12 0500  10/25/12 0650  NA 131* 132* 138 139  K 4.9 4.7 4.9 4.8  CL 104 105 109 111  CO2  --  15* 17* 17*  GLUCOSE 365* 309* 205* 278*  BUN 100* 96* 59* 34*  CREATININE 3.10* 2.61* 1.66* 1.47*  CALCIUM  --  8.2* 9.1 9.1  PHOS  --   --  3.0  --    Liver Function Tests:  Recent Labs Lab 10/23/12 1925 10/24/12 0500  ALBUMIN 1.7* 1.9*   CBC:  Recent Labs Lab 10/23/12 0005 10/23/12 0016 10/23/12 0455 10/23/12 1929 10/24/12 0500 10/25/12 0650  WBC 17.0*  --  15.9* 15.9* 18.1* 18.7*  NEUTROABS 14.7*  --   --   --   --   --   HGB 7.8* 8.2* 6.4* 8.4* 9.1* 8.7*  HCT 22.9* 24.0* 19.0* 24.2* 26.3* 25.7*   MCV 83.0  --  84.1 84.3 85.4 86.5  PLT 539*  --  476* 451* 494* 462*   CBG:  Recent Labs Lab 10/23/12 2134 10/24/12 0812 10/24/12 1320 10/24/12 1719 10/24/12 2135  GLUCAP 164* 204* 300* 295* 299*    Recent Results (from the past 240 hour(s))  CULTURE, BLOOD (ROUTINE X 2)     Status: None   Collection Time    10/23/12  4:15 AM      Result Value Range Status   Specimen Description BLOOD LEFT ARM   Final   Special Requests BOTTLES DRAWN AEROBIC AND ANAEROBIC 10CC EACH   Final   Culture  Setup Time     Final   Value: 10/23/2012 10:20     Performed at [facility]   Culture     Final   Value:        BLOOD CULTURE RECEIVED NO GROWTH TO DATE CULTURE WILL BE HELD FOR 5 DAYS BEFORE ISSUING A FINAL NEGATIVE REPORT     Performed at [facility]   Report Status PENDING   Incomplete  CULTURE, BLOOD (ROUTINE X 2)     Status: None   Collection Time    10/23/12  4:15 AM      Result Value Range Status   Specimen Description BLOOD LEFT HAND   Final   Special Requests BOTTLES DRAWN AEROBIC ONLY 10CC   Final   Culture  Setup Time     Final   Value: 10/23/2012 10:19     Performed at [facility]   Culture     Final   Value:        BLOOD CULTURE RECEIVED NO GROWTH TO DATE CULTURE WILL BE HELD FOR 5 DAYS BEFORE ISSUING A FINAL NEGATIVE REPORT     Performed at [facility]   Report Status PENDING   Incomplete  MRSA PCR SCREENING     Status: None   Collection Time    10/23/12  5:41 AM      Result Value Range Status   MRSA by PCR NEGATIVE  NEGATIVE Final   Comment:            The GeneXpert MRSA Assay (FDA     approved for NASAL specimens     only), is one component of a     comprehensive MRSA colonization     surveillance program. It is not     intended to diagnose MRSA     infection nor to guide or     monitor treatment for     MRSA infections.     Studies:  Recent  x-ray studies have been reviewed in detail by the Attending Physician  Scheduled  Meds:  Scheduled Meds: . atorvastatin  20 mg Oral Daily  . ciprofloxacin  400 mg Intravenous Q12H  . insulin aspart  0-20 Units Subcutaneous TID WC  . insulin aspart  0-5 Units Subcutaneous QHS  . insulin aspart protamine- aspart  15 Units Subcutaneous BID WC  . phenytoin  200 mg Oral BID  . senna-docusate  1 tablet Oral BID  . sodium chloride  3 mL Intravenous Q12H  . timolol  1 drop Both Eyes Daily  . Travoprost (BAK Free)  1 drop Both Eyes QHS    Time spent on care of this patient: 35 mins   [place]  [phone] Pager - Text Page per Shea Evans as per below:  On-Call/Text Page:      [email or web address]      password [email or web address]  If 7PM-7AM, please contact night-coverage [email or web address] Password [email or web address] 10/25/2012, 8:16 AM   LOS: 3 days

## 2012-10-25 NOTE — Consult Note (Signed)
  Patient is status post internal fixation of the right foot do to Charcot collapse. Examination of foot there is no swelling there is good wrinkling of the skin there is warmth there is a very small drop of drainage from the surgical incision. There is no ischemic or gangrenous changes to the foot. Intraoperatively patient did initially have exposed bone this bone was excised with the decreased microcirculation in his foot there is possibility he may have developed a acute infection in the foot. Recommend gram-positive coverage possibly vancomycin. I will reevaluate on Monday morning. If patient's white blood cell count has not returned to normal we'll proceed with irrigation and debridement placement of antibiotic beads removal of deep hardware as an add-on surgery for Monday evening.

## 2012-10-26 DIAGNOSIS — D649 Anemia, unspecified: Secondary | ICD-10-CM

## 2012-10-26 DIAGNOSIS — A419 Sepsis, unspecified organism: Secondary | ICD-10-CM

## 2012-10-26 LAB — BASIC METABOLIC PANEL
CO2: 18 mEq/L — ABNORMAL LOW (ref 19–32)
Chloride: 111 mEq/L (ref 96–112)
GFR calc Af Amer: 63 mL/min — ABNORMAL LOW (ref >90)
Potassium: 4.2 mEq/L (ref 3.5–5.1)
Sodium: 138 mEq/L (ref 135–145)

## 2012-10-26 LAB — CBC
Platelets: 422 10*3/uL — ABNORMAL HIGH (ref 150–400)
RBC: 2.87 MIL/uL — ABNORMAL LOW (ref 4.22–5.81)
WBC: 19.2 10*3/uL — ABNORMAL HIGH (ref 4.0–10.5)

## 2012-10-26 NOTE — Progress Notes (Addendum)
[facility] Progress Note [name]. [id number] DOB: 1945-08-14 DOA: 10/22/2012 PCP: [name], MD  Brief narrative: 67 y.o. male who presented with an episode of LOC / AMS. He was apparently on the toilet at home when his wife found him altered. Patient stated he remembered sitting down on the commode then next thing he knew there were people he didn't know in the room (EMS personell). Patient was slow to respond.  He had no headache, CP, SOB, or focal symptoms. Pt is s/p recent RLE foot surgery for a foot ulcer and charcot foot repair. Later during his course of stay in the ED the pt developed fever, tachypnea, and hypotension in addition to his WBC of 17k. He was also noted to have AKI with BUN of 100.  Assessment/Plan:  SIRS v/s sepsis - possible R foot surgical wound infection  Unclear source of infection, but likely from R foot wound - UA negative - blood cultures remain negative , but WBC remain markedly elevated - continue treating as wound infection as discussed w/ Ortho - pt may require return to OR Monday - Tmax so far [id number] Toxic metabolic encephalopathy/uremia Resolved - due to above + uremia   Acute on chronic anemia - ?acute blood loss Hgb 12.17 September 2012 - 9.23 September 2011 - Hgb nadir of 6.4 thus far - pt stated he had been having black stools at home but noted to be brown while in hospital - Hgb stable - continue to follow - Fe studies-mixed anemia - waiting on guiac stool  -if hgb remains stable outpt GI followup  anaphalaxis to PCN   AKI on CKD  Baseline GFR ~45 as of July 2014 and July 2013 - back to baseline after fluid and blood infused  -resolved DM2 - uncontrolled CBG still poorly controlled - home regimen resumed - adjust further and follow  Hx of HTN No change in tx plan today - follow   Sz d/o Dilantin dosing being adjusted by Rx  Code Status: FULL Family Communication: sister at  bedside Disposition Plan: - if wbc not improved by Monday, Dr. [name] to take pt to OR for I&D  Consultants: Ortho - Dr. [name]  Procedures: none  Antibiotics: Cipro 8/5 >> Vanc 8/5 >> 8/6 + 8/8 >>  DVT prophylaxis: SCD only until clear if pt bleeding  HPI/Subjective: Pt  denies pain in his foot.  No cp, sob, n/v, or abdom pain.    Objective: Blood pressure 156/62, pulse 87, temperature 97.7 F (36.5 C), temperature source Oral, resp. rate 18, height 5\' 11"  (1.803 m), weight 81 kg (178 lb 9.2 oz), SpO2 98.00%.  Intake/Output Summary (Last 24 hours) at 10/26/12 1148 Last data filed at 10/26/12 0900  Gross per 24 hour  Intake   1680 ml  Output   1100 ml  Net    580 ml    Exam: General: No acute respiratory distress Lungs: Clear to auscultation bilaterally without wheezes or crackles Cardiovascular: Regular rate and rhythm without murmur gallop or rub normal S1 and S2 Abdomen: Nontender, nondistended, soft, bowel sounds positive, no rebound, no ascites, no appreciable mass Extremities: No significant cyanosis, clubbing, or edema bilateral lower extremities - R foot with dressing clean and dry   Data Reviewed: Basic Metabolic Panel:  Recent Labs Lab 10/23/12 0016 10/23/12 0455 10/24/12 0500 10/25/12 0650 10/26/12 0505  NA 131* 132* 138 139 138  K 4.9 4.7 4.9 4.8 4.2  CL 104 105 109 111 111  CO2  --  15* 17* 17* 18*  GLUCOSE 365* 309* 205* 278* 153*  BUN 100* 96* 59* 34* 21  CREATININE 3.10* 2.61* 1.66* 1.47* 1.34  CALCIUM  --  8.2* 9.1 9.1 8.8  PHOS  --   --  3.0  --   --    Liver Function Tests:  Recent Labs Lab 10/23/12 1925 10/24/12 0500  ALBUMIN 1.7* 1.9*   CBC:  Recent Labs Lab 10/23/12 0005  10/23/12 1929 10/24/12 0500 10/25/12 0650 10/25/12 1400 10/26/12 0505  WBC 17.0*  < > 15.9* 18.1* 18.7* 18.7* 19.2*  NEUTROABS 14.7*  --   --   --   --   --   --   HGB 7.8*  < > 8.4* 9.1* 8.7* 8.5* 8.5*  HCT 22.9*  < > 24.2* 26.3* 25.7* 24.8* 24.7*   MCV 83.0  < > 84.3 85.4 86.5 85.8 86.1  PLT 539*  < > 451* 494* 462* 426* 422*  < > = values in this interval not displayed. CBG:  Recent Labs Lab 10/25/12 1134 10/25/12 1757 10/25/12 2142 10/26/12 0632 10/26/12 1125  GLUCAP 356* 259* 186* 158* 254*    Recent Results (from the past 240 hour(s))  CULTURE, BLOOD (ROUTINE X 2)     Status: None   Collection Time    10/23/12  4:15 AM      Result Value Range Status   Specimen Description BLOOD LEFT ARM   Final   Special Requests BOTTLES DRAWN AEROBIC AND ANAEROBIC 10CC EACH   Final   Culture  Setup Time     Final   Value: 10/23/2012 10:20     Performed at [facility]   Culture     Final   Value:        BLOOD CULTURE RECEIVED NO GROWTH TO DATE CULTURE WILL BE HELD FOR 5 DAYS BEFORE ISSUING A FINAL NEGATIVE REPORT     Performed at [facility]   Report Status PENDING   Incomplete  CULTURE, BLOOD (ROUTINE X 2)     Status: None   Collection Time    10/23/12  4:15 AM      Result Value Range Status   Specimen Description BLOOD LEFT HAND   Final   Special Requests BOTTLES DRAWN AEROBIC ONLY 10CC   Final   Culture  Setup Time     Final   Value: 10/23/2012 10:19     Performed at [facility]   Culture     Final   Value:        BLOOD CULTURE RECEIVED NO GROWTH TO DATE CULTURE WILL BE HELD FOR 5 DAYS BEFORE ISSUING A FINAL NEGATIVE REPORT     Performed at [facility]   Report Status PENDING   Incomplete  MRSA PCR SCREENING     Status: None   Collection Time    10/23/12  5:41 AM      Result Value Range Status   MRSA by PCR NEGATIVE  NEGATIVE Final   Comment:            The GeneXpert MRSA Assay (FDA     approved for NASAL specimens     only), is one component of a     comprehensive MRSA colonization     surveillance program. It is not     intended to diagnose MRSA     infection nor to guide or     monitor  treatment for     MRSA infections.     Studies:  I have reviewed all Recent x-ray  studies in detail   Scheduled Meds:  Scheduled Meds: . atorvastatin  20 mg Oral Daily  . ciprofloxacin  400 mg Intravenous Q12H  . insulin aspart  0-20 Units Subcutaneous TID WC  . insulin aspart  0-5 Units Subcutaneous QHS  . insulin aspart protamine- aspart  20 Units Subcutaneous BID WC  . metoprolol  25 mg Oral BID  . phenytoin  200 mg Oral BID  . polyethylene glycol  17 g Oral Daily  . senna-docusate  1 tablet Oral BID  . sodium chloride  3 mL Intravenous Q12H  . timolol  1 drop Both Eyes Daily  . Travoprost (BAK Free)  1 drop Both Eyes QHS  . vancomycin  750 mg Intravenous Q12H    Time spent on care of this patient: 35 mins   Sheila Oats [place]  [phone] Pager - Text Page per Shea Evans as per below:  On-Call/Text Page:      [email or web address]      password [email or web address]  If 7PM-7AM, please contact night-coverage [email or web address] Password [email or web address] 10/26/2012, 11:48 AM   LOS: 4 days

## 2012-10-27 LAB — BASIC METABOLIC PANEL
BUN: 20 mg/dL (ref 6–23)
Chloride: 109 mEq/L (ref 96–112)
Glucose, Bld: 138 mg/dL — ABNORMAL HIGH (ref 70–99)
Potassium: 4.1 mEq/L (ref 3.5–5.1)

## 2012-10-27 LAB — GLUCOSE, CAPILLARY
Glucose-Capillary: 176 mg/dL — ABNORMAL HIGH (ref 70–99)
Glucose-Capillary: 301 mg/dL — ABNORMAL HIGH (ref 70–99)
Glucose-Capillary: 167 mg/dL — ABNORMAL HIGH (ref 70–99)

## 2012-10-27 LAB — CBC
HCT: 24.3 % — ABNORMAL LOW (ref 39.0–52.0)
Hemoglobin: 8.2 g/dL — ABNORMAL LOW (ref 13.0–17.0)
RBC: 2.83 MIL/uL — ABNORMAL LOW (ref 4.22–5.81)
WBC: 17.2 10*3/uL — ABNORMAL HIGH (ref 4.0–10.5)

## 2012-10-27 LAB — LACTIC ACID, PLASMA: Lactic Acid, Venous: 3.2 mmol/L — ABNORMAL HIGH (ref 0.5–2.2)

## 2012-10-27 LAB — VANCOMYCIN, TROUGH: Vancomycin Tr: 18.9 ug/mL (ref 10.0–20.0)

## 2012-10-27 NOTE — Progress Notes (Signed)
Patient's wife called earlier this evening c/o her husband being in bed since Wednesday and not getting up and moving. Patient has a left BKA and his prosthetic is at home.  He also had surgery on right foot.  His wife will bring in his prosthetic in the morning. I offered to have patient sit up at the side of the bed but he stated that he does not want to get up out of bed or sit at the side of the bed. Wife requested MD or PA to call her. Mrs. [place] would like to be reached on her cell phone [phone].

## 2012-10-27 NOTE — Progress Notes (Signed)
Pharmacy Consult for Vancomycin/Phenytoin  Indication: R-foot infection s/p surgery/ Hx of sz  Allergies  Allergen Reactions  . Codeine Anaphylaxis  . Penicillins Anaphylaxis    Patient Measurements: Height: 5\' 11"  (180.3 cm) Weight: 178 lb 9.2 oz (81 kg) IBW/kg (Calculated) : 75.3  Vital Signs: Temp: 99 F (37.2 C) (08/10 0511) Temp src: Oral (08/10 0511) BP: 146/79 mmHg (08/10 0511) Pulse Rate: 87 (08/10 0511) Intake/Output from previous day: 08/09 0701 - 08/10 0700 In: 1070 [P.O.:720; IV Piggyback:350] Out: 1050 [Urine:1050] Labs:  Recent Labs  10/25/12 0650 10/25/12 1400 10/26/12 0505 10/27/12 0545  WBC 18.7* 18.7* 19.2* 17.2*  HGB 8.7* 8.5* 8.5* 8.2*  PLT 462* 426* 422* 397  CREATININE 1.47*  --  1.34 1.35   Estimated Creatinine Clearance: 58.1 ml/min (by C-G formula based on Cr of 1.35).  Recent Labs  10/27/12 0810  VANCOTROUGH 18.9    Phenytoin Level 8/6: 5.8, Alb 1.7>>corr DPH level=13   Microbiology: Recent Results (from the past 720 hour(s))  CULTURE, BLOOD (ROUTINE X 2)     Status: None   Collection Time    10/23/12  4:15 AM      Result Value Range Status   Specimen Description BLOOD LEFT ARM   Final   Special Requests BOTTLES DRAWN AEROBIC AND ANAEROBIC 10CC EACH   Final   Culture  Setup Time     Final   Value: 10/23/2012 10:20     Performed at [facility]   Culture     Final   Value:        BLOOD CULTURE RECEIVED NO GROWTH TO DATE CULTURE WILL BE HELD FOR 5 DAYS BEFORE ISSUING A FINAL NEGATIVE REPORT     Performed at [facility]   Report Status PENDING   Incomplete  CULTURE, BLOOD (ROUTINE X 2)     Status: None   Collection Time    10/23/12  4:15 AM      Result Value Range Status   Specimen Description BLOOD LEFT HAND   Final   Special Requests BOTTLES DRAWN AEROBIC ONLY 10CC   Final   Culture  Setup Time     Final   Value: 10/23/2012 10:19     Performed at [facility]   Culture     Final   Value:         BLOOD CULTURE RECEIVED NO GROWTH TO DATE CULTURE WILL BE HELD FOR 5 DAYS BEFORE ISSUING A FINAL NEGATIVE REPORT     Performed at [facility]   Report Status PENDING   Incomplete  MRSA PCR SCREENING     Status: None   Collection Time    10/23/12  5:41 AM      Result Value Range Status   MRSA by PCR NEGATIVE  NEGATIVE Final   Comment:            The GeneXpert MRSA Assay (FDA     approved for NASAL specimens     only), is one component of a     comprehensive MRSA colonization     surveillance program. It is not     intended to diagnose MRSA     infection nor to guide or     monitor treatment for     MRSA infections.    Medical History: Past Medical History  Diagnosis Date  . Diabetes mellitus   . Hypertension   . Seizures   . Stroke   . Peripheral vascular disease   .  CKD (chronic kidney disease)     Stage II (per 06/2010 notes)  . Neuromuscular disorder     Assessment: [name] s/p internal fixation of right foot with warmth and small drainage from surgical incision. Started Cipro on 8/6, and restarted Vanc on 8/8. VT drawn was 18.9, and therapeutic today, WBC decreased to 17.2, Scr improving (1.35) with CrCl 58, Tmax 100. Per ortho note, pt did have exposed bone intraoperatively, and ortho will re-evaluate Monday morning.  Patient also has a hx of seizure and is continuing home phenytoin.  Phenytoin dose was recent increased per level <2.5 on admit.  Level from 8/6, once corrected for low albumin is 13 and therapeutic.  No seizure activity noted in chart.  May consider phenytoin level 5-7 days after dose change.  Goals of Therapy:  Vancomycin trough level 15-20 mcg/ml Phenytoin level 10-20 mg/L, No seizures activity  Plan:  -Continue Vancomycin 750 mg IV q12h -Continue cipro 400 mg IV q12h -Continue phenytoin 200 mg po BID  -Trend WBC, temp, renal function -F/U ortho plans -Continue to monitor s/s seizures -May consider phenytoin level 5-7 days after dose  change, possibly early next wk  Thank you for allowing me to take part in this patient's care,  Jeronimo Norma, PharmD Clinical Pharmacist Pager: [phone]  10/27/2012 10:54 AM

## 2012-10-27 NOTE — Progress Notes (Addendum)
[facility] Progress Note [place] TEAM 1 - Stepdown/ICU TEAM   [name]. [id number] DOB: 12-Aug-1945 DOA: 10/22/2012 PCP: [name], MD  Brief narrative: 67 y.o. male who presented with an episode of LOC / AMS. He was apparently on the toilet at home when his wife found him altered. Patient stated he remembered sitting down on the commode then next thing he knew there were people he didn't know in the room (EMS personell). Patient was slow to respond.  He had no headache, CP, SOB, or focal symptoms. Pt is s/p recent RLE foot surgery for a foot ulcer and charcot foot repair. Later during his course of stay in the ED the pt developed fever, tachypnea, and hypotension in addition to his WBC of 17k. He was also noted to have AKI with BUN of 100.  Assessment/Plan:  SIRS v/s sepsis - possible R foot surgical wound infection  Unclear source of infection, but likely from R foot wound - UA negative - blood cultures remain negative , but WBC remain markedly elevated - continue treating as wound infection as discussed w/ Ortho - pt will very likely require return to OR in am - Tmax so far 100.5, still with leukocytosis. Toxic metabolic encephalopathy/uremia Resolved - due to above + uremia   Acute on chronic anemia - ?acute blood loss Hgb 12.17 September 2012 - 9.23 September 2011 - Hgb nadir of 6.4 thus far - pt stated he had been having black stools at home but noted to be brown while in hospital - Hgb stable - continue to follow - Fe studies-mixed anemia - waiting on guiac stool  -if hgb remains stable outpt GI followup  anaphalaxis to PCN   AKI on CKD  Baseline GFR ~45 as of July 2014 and July 2013 - back to baseline after fluid and blood infused  -resolved DM2 - uncontrolled CBG still poorly controlled - home regimen resumed - adjust further and follow  Hx of HTN No change in tx plan today - follow   Sz d/o Dilantin dosing being adjusted by Rx  Metab acidosis -with  controlled BG, resolved AKI. Likely due to lactic acidosis 2/2 #1- check lactic acid and follow  Code Status: FULL Family Communication: none at bedside Disposition Plan: - if wbc not improved by Monday, Dr. [name] to take pt to OR for I&D  Consultants: Ortho - Dr. [name]  Procedures: none  Antibiotics: Cipro 8/5 >> Vanc 8/5 >> 8/6 + 8/8 >>  DVT prophylaxis: SCD only until clear if pt bleeding  HPI/Subjective: Getting up from bed to chair with assistance, c/o pain in R. foot.    Objective: Blood pressure 146/79, pulse 87, temperature 99 F (37.2 C), temperature source Oral, resp. rate 20, height 5\' 11"  (1.803 m), weight 81 kg (178 lb 9.2 oz), SpO2 100.00%.  Intake/Output Summary (Last 24 hours) at 10/27/12 1114 Last data filed at 10/27/12 0700  Gross per 24 hour  Intake    680 ml  Output    750 ml  Net    -70 ml    Exam: General: No acute respiratory distress Lungs: Clear to auscultation bilaterally without wheezes or crackles Cardiovascular: Regular rate and rhythm without murmur gallop or rub normal S1 and S2 Abdomen: Nontender, nondistended, soft, bowel sounds positive, no rebound, no ascites, no appreciable mass Extremities: No significant cyanosis, clubbing, or edema bilateral lower extremities - R foot with dressing clean and dry L. BKA  Data Reviewed: Basic Metabolic Panel:  Recent Labs Lab 10/23/12 0455 10/24/12 0500 10/25/12 0650 10/26/12 0505 10/27/12 0545  NA 132* 138 139 138 137  K 4.7 4.9 4.8 4.2 4.1  CL 105 109 111 111 109  CO2 15* 17* 17* 18* 16*  GLUCOSE 309* 205* 278* 153* 138*  BUN 96* 59* 34* 21 20  CREATININE 2.61* 1.66* 1.47* 1.34 1.35  CALCIUM 8.2* 9.1 9.1 8.8 8.6  PHOS  --  3.0  --   --   --    Liver Function Tests:  Recent Labs Lab 10/23/12 1925 10/24/12 0500  ALBUMIN 1.7* 1.9*   CBC:  Recent Labs Lab 10/23/12 0005  10/24/12 0500 10/25/12 0650 10/25/12 1400 10/26/12 0505 10/27/12 0545  WBC 17.0*  < > 18.1* 18.7* 18.7*  19.2* 17.2*  NEUTROABS 14.7*  --   --   --   --   --   --   HGB 7.8*  < > 9.1* 8.7* 8.5* 8.5* 8.2*  HCT 22.9*  < > 26.3* 25.7* 24.8* 24.7* 24.3*  MCV 83.0  < > 85.4 86.5 85.8 86.1 85.9  PLT 539*  < > 494* 462* 426* 422* 397  < > = values in this interval not displayed. CBG:  Recent Labs Lab 10/26/12 1125 10/26/12 1638 10/26/12 2151 10/27/12 0647 10/27/12 1057  GLUCAP 254* 170* 170* 176* 301*    Recent Results (from the past 240 hour(s))  CULTURE, BLOOD (ROUTINE X 2)     Status: None   Collection Time    10/23/12  4:15 AM      Result Value Range Status   Specimen Description BLOOD LEFT ARM   Final   Special Requests BOTTLES DRAWN AEROBIC AND ANAEROBIC 10CC EACH   Final   Culture  Setup Time     Final   Value: 10/23/2012 10:20     Performed at [facility]   Culture     Final   Value:        BLOOD CULTURE RECEIVED NO GROWTH TO DATE CULTURE WILL BE HELD FOR 5 DAYS BEFORE ISSUING A FINAL NEGATIVE REPORT     Performed at [facility]   Report Status PENDING   Incomplete  CULTURE, BLOOD (ROUTINE X 2)     Status: None   Collection Time    10/23/12  4:15 AM      Result Value Range Status   Specimen Description BLOOD LEFT HAND   Final   Special Requests BOTTLES DRAWN AEROBIC ONLY 10CC   Final   Culture  Setup Time     Final   Value: 10/23/2012 10:19     Performed at [facility]   Culture     Final   Value:        BLOOD CULTURE RECEIVED NO GROWTH TO DATE CULTURE WILL BE HELD FOR 5 DAYS BEFORE ISSUING A FINAL NEGATIVE REPORT     Performed at [facility]   Report Status PENDING   Incomplete  MRSA PCR SCREENING     Status: None   Collection Time    10/23/12  5:41 AM      Result Value Range Status   MRSA by PCR NEGATIVE  NEGATIVE Final   Comment:            The GeneXpert MRSA Assay (FDA     approved for NASAL specimens     only), is one component of a     comprehensive MRSA colonization     surveillance program. It  is not     intended to  diagnose MRSA     infection nor to guide or     monitor treatment for     MRSA infections.     Studies:  I have reviewed all Recent x-ray studies in detail   Scheduled Meds:  Scheduled Meds: . atorvastatin  20 mg Oral Daily  . ciprofloxacin  400 mg Intravenous Q12H  . insulin aspart  0-20 Units Subcutaneous TID WC  . insulin aspart  0-5 Units Subcutaneous QHS  . insulin aspart protamine- aspart  20 Units Subcutaneous BID WC  . metoprolol  25 mg Oral BID  . phenytoin  200 mg Oral BID  . polyethylene glycol  17 g Oral Daily  . senna-docusate  1 tablet Oral BID  . sodium chloride  3 mL Intravenous Q12H  . timolol  1 drop Both Eyes Daily  . Travoprost (BAK Free)  1 drop Both Eyes QHS  . vancomycin  750 mg Intravenous Q12H    Time spent on care of this patient: 35 mins   Sheila Oats [place]  [phone] Pager - Text Page per Shea Evans as per below:  On-Call/Text Page:      [email or web address]      password [email or web address]  If 7PM-7AM, please contact night-coverage [email or web address] Password [email or web address] 10/27/2012, 11:14 AM   LOS: 5 days

## 2012-10-28 ENCOUNTER — Encounter (HOSPITAL_COMMUNITY): Payer: Self-pay | Admitting: Anesthesiology

## 2012-10-28 ENCOUNTER — Encounter (HOSPITAL_COMMUNITY)
Admission: EM | Disposition: A | Discharge status: Home Health | Payer: Self-pay | Source: Home / Self Care | Attending: Internal Medicine

## 2012-10-28 ENCOUNTER — Inpatient Hospital Stay (HOSPITAL_COMMUNITY): Payer: Medicare Other | Admitting: Anesthesiology

## 2012-10-28 ENCOUNTER — Encounter: Payer: Self-pay | Admitting: Vascular Surgery

## 2012-10-28 HISTORY — PX: I&D EXTREMITY: SHX5045

## 2012-10-28 LAB — GLUCOSE, CAPILLARY
Glucose-Capillary: 111 mg/dL — ABNORMAL HIGH (ref 70–99)
Glucose-Capillary: 175 mg/dL — ABNORMAL HIGH (ref 70–99)
Glucose-Capillary: 160 mg/dL — ABNORMAL HIGH (ref 70–99)

## 2012-10-28 LAB — BASIC METABOLIC PANEL
CO2: 16 mEq/L — ABNORMAL LOW (ref 19–32)
Calcium: 8.5 mg/dL (ref 8.4–10.5)
Chloride: 109 mEq/L (ref 96–112)
GFR calc Af Amer: 56 mL/min — ABNORMAL LOW (ref >90)
Sodium: 135 mEq/L (ref 135–145)

## 2012-10-28 LAB — CULTURE, BLOOD (ROUTINE X 2): Culture: NO GROWTH

## 2012-10-28 LAB — CBC
MCH: 28.6 pg (ref 26.0–34.0)
Platelets: 396 10*3/uL (ref 150–400)
RBC: 2.9 MIL/uL — ABNORMAL LOW (ref 4.22–5.81)
RDW: 13.8 % (ref 11.5–15.5)
WBC: 16.7 10*3/uL — ABNORMAL HIGH (ref 4.0–10.5)

## 2012-10-28 SURGERY — IRRIGATION AND DEBRIDEMENT EXTREMITY
Anesthesia: General | Site: Foot | Laterality: Right | Wound class: Dirty or Infected

## 2012-10-28 MED ORDER — PROMETHAZINE HCL 25 MG/ML IJ SOLN: 6.2500 mg | INTRAMUSCULAR | Status: DC | PRN

## 2012-10-28 MED ORDER — ONDANSETRON HCL 4 MG PO TABS: 4.0000 mg | ORAL_TABLET | Freq: Four times a day (QID) | ORAL | Status: DC | PRN

## 2012-10-28 MED ORDER — PROPOFOL 10 MG/ML IV BOLUS
INTRAVENOUS | Status: DC | PRN
  Administered 2012-10-28: 160 mg via INTRAVENOUS

## 2012-10-28 MED ORDER — VANCOMYCIN HCL 500 MG IV SOLR
INTRAVENOUS | Status: DC | PRN
  Administered 2012-10-28: 500 mg

## 2012-10-28 MED ORDER — MIDAZOLAM HCL 5 MG/5ML IJ SOLN
INTRAMUSCULAR | Status: DC | PRN
  Administered 2012-10-28: 2 mg via INTRAVENOUS

## 2012-10-28 MED ORDER — METOCLOPRAMIDE HCL 5 MG/ML IJ SOLN
5.0000 mg | Freq: Three times a day (TID) | INTRAMUSCULAR | Status: DC | PRN
  Filled 2012-10-28: qty 2

## 2012-10-28 MED ORDER — ONDANSETRON HCL 4 MG/2ML IJ SOLN: 4.0000 mg | Freq: Four times a day (QID) | INTRAMUSCULAR | Status: DC | PRN

## 2012-10-28 MED ORDER — GENTAMICIN SULFATE 40 MG/ML IJ SOLN
INTRAMUSCULAR | Status: DC | PRN
  Administered 2012-10-28 (×2): 80 mg

## 2012-10-28 MED ORDER — FENTANYL CITRATE 0.05 MG/ML IJ SOLN
INTRAMUSCULAR | Status: DC | PRN
  Administered 2012-10-28: 75 ug via INTRAVENOUS
  Administered 2012-10-28: 50 ug via INTRAVENOUS
  Administered 2012-10-28: 75 ug via INTRAVENOUS

## 2012-10-28 MED ORDER — MORPHINE SULFATE 2 MG/ML IJ SOLN: 1.0000 mg | INTRAMUSCULAR | Status: DC | PRN

## 2012-10-28 MED ORDER — METOCLOPRAMIDE HCL 10 MG PO TABS
5.0000 mg | ORAL_TABLET | Freq: Three times a day (TID) | ORAL | Status: DC | PRN
  Filled 2012-10-28: qty 1

## 2012-10-28 MED ORDER — LIDOCAINE HCL (CARDIAC) 20 MG/ML IV SOLN
INTRAVENOUS | Status: DC | PRN
  Administered 2012-10-28: 80 mg via INTRAVENOUS

## 2012-10-28 MED ORDER — SODIUM CHLORIDE 0.9 % IV SOLN
INTRAVENOUS | Status: DC
  Administered 2012-10-28: 19:00:00 via INTRAVENOUS

## 2012-10-28 MED ORDER — SODIUM CHLORIDE 0.9 % IR SOLN
Status: DC | PRN
  Administered 2012-10-28 (×2): 1000 mL

## 2012-10-28 SURGICAL SUPPLY — 46 items
BANDAGE GAUZE ELAST BULKY 4 IN (GAUZE/BANDAGES/DRESSINGS) ×2 IMPLANT
BLADE SURG 10 STRL SS (BLADE) IMPLANT
BNDG COHESIVE 4X5 TAN STRL (GAUZE/BANDAGES/DRESSINGS) IMPLANT
BNDG COHESIVE 6X5 TAN STRL LF (GAUZE/BANDAGES/DRESSINGS) ×2 IMPLANT
BNDG GAUZE STRTCH 6 (GAUZE/BANDAGES/DRESSINGS) IMPLANT
CLOTH BEACON ORANGE TIMEOUT ST (SAFETY) ×2 IMPLANT
COTTON STERILE ROLL (GAUZE/BANDAGES/DRESSINGS) ×2 IMPLANT
COVER SURGICAL LIGHT HANDLE (MISCELLANEOUS) ×2 IMPLANT
CUFF TOURNIQUET SINGLE 18IN (TOURNIQUET CUFF) ×2 IMPLANT
CUFF TOURNIQUET SINGLE 24IN (TOURNIQUET CUFF) IMPLANT
CUFF TOURNIQUET SINGLE 34IN LL (TOURNIQUET CUFF) IMPLANT
CUFF TOURNIQUET SINGLE 44IN (TOURNIQUET CUFF) IMPLANT
DRAPE U-SHAPE 47X51 STRL (DRAPES) ×2 IMPLANT
DRSG ADAPTIC 3X8 NADH LF (GAUZE/BANDAGES/DRESSINGS) ×2 IMPLANT
DRSG PAD ABDOMINAL 8X10 ST (GAUZE/BANDAGES/DRESSINGS) ×2 IMPLANT
DURAPREP 26ML APPLICATOR (WOUND CARE) ×2 IMPLANT
ELECT CAUTERY BLADE 6.4 (BLADE) IMPLANT
ELECT REM PT RETURN 9FT ADLT (ELECTROSURGICAL) ×2
ELECTRODE REM PT RTRN 9FT ADLT (ELECTROSURGICAL) ×1 IMPLANT
GLOVE BIOGEL PI IND STRL 9 (GLOVE) ×1 IMPLANT
GLOVE BIOGEL PI INDICATOR 9 (GLOVE) ×1
GLOVE SURG ORTHO 9.0 STRL STRW (GLOVE) ×2 IMPLANT
GOWN PREVENTION PLUS XLARGE (GOWN DISPOSABLE) ×2 IMPLANT
GOWN SRG XL XLNG 56XLVL 4 (GOWN DISPOSABLE) IMPLANT
GOWN STRL NON-REIN XL XLG LVL4 (GOWN DISPOSABLE)
HANDPIECE INTERPULSE COAX TIP (DISPOSABLE)
KIT BASIN OR (CUSTOM PROCEDURE TRAY) ×2 IMPLANT
KIT ROOM TURNOVER OR (KITS) ×2 IMPLANT
KIT STIMULAN RAPID CURE 5CC (Orthopedic Implant) ×2 IMPLANT
MANIFOLD NEPTUNE II (INSTRUMENTS) IMPLANT
NS IRRIG 1000ML POUR BTL (IV SOLUTION) ×2 IMPLANT
PACK ORTHO EXTREMITY (CUSTOM PROCEDURE TRAY) ×2 IMPLANT
PAD ARMBOARD 7.5X6 YLW CONV (MISCELLANEOUS) ×4 IMPLANT
PADDING CAST COTTON 6X4 STRL (CAST SUPPLIES) IMPLANT
SET HNDPC FAN SPRY TIP SCT (DISPOSABLE) IMPLANT
SPONGE GAUZE 4X4 12PLY (GAUZE/BANDAGES/DRESSINGS) ×2 IMPLANT
SPONGE LAP 18X18 X RAY DECT (DISPOSABLE) ×4 IMPLANT
STOCKINETTE IMPERVIOUS 9X36 MD (GAUZE/BANDAGES/DRESSINGS) IMPLANT
SUT ETHILON 2 0 PSLX (SUTURE) ×4 IMPLANT
TOWEL OR 17X24 6PK STRL BLUE (TOWEL DISPOSABLE) ×2 IMPLANT
TOWEL OR 17X26 10 PK STRL BLUE (TOWEL DISPOSABLE) ×2 IMPLANT
TUBE ANAEROBIC SPECIMEN COL (MISCELLANEOUS) ×2 IMPLANT
TUBE CONNECTING 12X1/4 (SUCTIONS) ×2 IMPLANT
UNDERPAD 30X30 INCONTINENT (UNDERPADS AND DIAPERS) ×2 IMPLANT
WATER STERILE IRR 1000ML POUR (IV SOLUTION) IMPLANT
YANKAUER SUCT BULB TIP NO VENT (SUCTIONS) ×2 IMPLANT

## 2012-10-28 NOTE — Progress Notes (Signed)
[facility] Progress Note [place] TEAM 1 - Stepdown/ICU TEAM   [name]. [id number] DOB: Feb 21, 1946 DOA: 10/22/2012 PCP: [name], MD  Brief narrative: 67 y.o. male who presented with an episode of LOC / AMS. He was apparently on the toilet at home when his wife found him altered. Patient stated he remembered sitting down on the commode then next thing he knew there were people he didn't know in the room (EMS personell). Patient was slow to respond.  He had no headache, CP, SOB, or focal symptoms. Pt is s/p recent RLE foot surgery for a foot ulcer and charcot foot repair. Later during his course of stay in the ED the pt developed fever, tachypnea, and hypotension in addition to his WBC of 17k. He was also noted to have AKI with BUN of 100.  Assessment/Plan:  SIRS v/s sepsis - possible R foot surgical wound infection  Unclear source of infection, but likely from R foot wound - UA negative - blood cultures remain negative , but WBC remain markedly elevated - continue treating as wound infection as discussed w/ Ortho  - still with leukocytosis>> to OR for ID per Dr [name] today. Toxic metabolic encephalopathy/uremia Resolved - due to above + uremia   Acute on chronic anemia - ?acute blood loss Hgb 12.17 September 2012 - 9.23 September 2011 - Hgb nadir of 6.4 thus far - pt stated he had been having black stools at home but noted to be brown while in hospital - Hgb stable - continue to follow - Fe studies-mixed anemia - waiting on guiac stool  -if hgb remains stable outpt GI followup  anaphalaxis to PCN   AKI on CKD  Baseline GFR ~45 as of July 2014 and July 2013 - back to baseline after fluid and blood infused  -resolved DM2 - uncontrolled CBG still poorly controlled - home regimen resumed - adjust further and follow  Hx of HTN No change in tx plan today - follow   Sz d/o Dilantin dosing being adjusted by Rx  Metab acidosis -with controlled BG, resolved AKI. Likely  due to lactic acidosis 2/2 #1- check lactic acid and follow  Code Status: FULL Family Communication: none at bedside Disposition Plan: -pending clinical course Consultants: Ortho - Dr. [name]  Procedures: none  Antibiotics: Cipro 8/5 >> Vanc 8/5 >> 8/6 + 8/8 >>   HPI/Subjective: Denies any new c/o   Objective: Blood pressure 145/61, pulse 70, temperature 98.8 F (37.1 C), temperature source Oral, resp. rate 20, height 5\' 11"  (1.803 m), weight 81 kg (178 lb 9.2 oz), SpO2 99.00%.  Intake/Output Summary (Last 24 hours) at 10/28/12 1453 Last data filed at 10/28/12 1300  Gross per 24 hour  Intake 6338.42 ml  Output   1450 ml  Net 4888.42 ml    Exam: General: No acute respiratory distress Lungs: Clear to auscultation bilaterally without wheezes or crackles Cardiovascular: Regular rate and rhythm without murmur gallop or rub normal S1 and S2 Abdomen: Nontender, nondistended, soft, bowel sounds positive, no rebound, no ascites, no appreciable mass Extremities: No significant cyanosis, clubbing, or edema bilateral lower extremities - R foot with dressing clean and dry L. BKA  Data Reviewed: Basic Metabolic Panel:  Recent Labs Lab 10/23/12 0455 10/24/12 0500 10/25/12 0650 10/26/12 0505 10/27/12 0545 10/28/12 0528  NA 132* 138 139 138 137 135  K 4.7 4.9 4.8 4.2 4.1 4.6  CL 105 109 111 111 109 109  CO2 15* 17* 17* 18* 16*  16*  GLUCOSE 309* 205* 278* 153* 138* 127*  BUN 96* 59* 34* 21 20 21   CREATININE 2.61* 1.66* 1.47* 1.34 1.35 1.46*  CALCIUM 8.2* 9.1 9.1 8.8 8.6 8.5  PHOS  --  3.0  --   --   --   --    Liver Function Tests:  Recent Labs Lab 10/23/12 1925 10/24/12 0500  ALBUMIN 1.7* 1.9*   CBC:  Recent Labs Lab 10/23/12 0005  10/25/12 0650 10/25/12 1400 10/26/12 0505 10/27/12 0545 10/28/12 0528  WBC 17.0*  < > 18.7* 18.7* 19.2* 17.2* 16.7*  NEUTROABS 14.7*  --   --   --   --   --   --   HGB 7.8*  < > 8.7* 8.5* 8.5* 8.2* 8.3*  HCT 22.9*  < > 25.7*  24.8* 24.7* 24.3* 24.5*  MCV 83.0  < > 86.5 85.8 86.1 85.9 84.5  PLT 539*  < > 462* 426* 422* 397 396  < > = values in this interval not displayed. CBG:  Recent Labs Lab 10/27/12 1057 10/27/12 1554 10/27/12 2158 10/28/12 0635 10/28/12 1125  GLUCAP 301* 286* 167* 111* 175*    Recent Results (from the past 240 hour(s))  CULTURE, BLOOD (ROUTINE X 2)     Status: None   Collection Time    10/23/12  4:15 AM      Result Value Range Status   Specimen Description BLOOD LEFT ARM   Final   Special Requests BOTTLES DRAWN AEROBIC AND ANAEROBIC 10CC EACH   Final   Culture  Setup Time     Final   Value: 10/23/2012 10:20     Performed at [facility]   Culture     Final   Value: NO GROWTH 5 DAYS     Performed at [facility]   Report Status 10/28/2012 FINAL   Final  CULTURE, BLOOD (ROUTINE X 2)     Status: None   Collection Time    10/23/12  4:15 AM      Result Value Range Status   Specimen Description BLOOD LEFT HAND   Final   Special Requests BOTTLES DRAWN AEROBIC ONLY 10CC   Final   Culture  Setup Time     Final   Value: 10/23/2012 10:19     Performed at [facility]   Culture     Final   Value: NO GROWTH 5 DAYS     Performed at [facility]   Report Status 10/28/2012 FINAL   Final  MRSA PCR SCREENING     Status: None   Collection Time    10/23/12  5:41 AM      Result Value Range Status   MRSA by PCR NEGATIVE  NEGATIVE Final   Comment:            The GeneXpert MRSA Assay (FDA     approved for NASAL specimens     only), is one component of a     comprehensive MRSA colonization     surveillance program. It is not     intended to diagnose MRSA     infection nor to guide or     monitor treatment for     MRSA infections.     Studies:  I have reviewed all Recent x-ray studies in detail   Scheduled Meds:  Scheduled Meds: . atorvastatin  20 mg Oral Daily  . ciprofloxacin  400 mg Intravenous Q12H  . insulin aspart  0-20 Units Subcutaneous  TID WC  . insulin aspart  0-5 Units Subcutaneous QHS  . insulin aspart protamine- aspart  20 Units Subcutaneous BID WC  . metoprolol  25 mg Oral BID  . phenytoin  200 mg Oral BID  . polyethylene glycol  17 g Oral Daily  . senna-docusate  1 tablet Oral BID  . sodium chloride  3 mL Intravenous Q12H  . timolol  1 drop Both Eyes Daily  . Travoprost (BAK Free)  1 drop Both Eyes QHS  . vancomycin  750 mg Intravenous Q12H    Time spent on care of this patient: 25 mins   Sheila Oats [place]  [phone] Pager - Text Page per Shea Evans as per below:  On-Call/Text Page:      [email or web address]      password [email or web address]  If 7PM-7AM, please contact night-coverage [email or web address] Password [email or web address] 10/28/2012, 2:53 PM   LOS: 6 days

## 2012-10-28 NOTE — Transfer of Care (Signed)
Immediate Anesthesia Transfer of Care Note  Patient: [name].  Procedure(s) Performed: Procedure(s) with comments: IRRIGATION AND DEBRIDEMENT EXTREMITY (Right) - Irrigation and Debridement Right Foot, Remove Deep Hardware, Place Antibiotic Beads  Patient Location: PACU  Anesthesia Type:General  Level of Consciousness: awake, alert , oriented and patient cooperative  Airway & Oxygen Therapy: Patient Spontanous Breathing and Patient connected to nasal cannula oxygen  Post-op Assessment: Report given to PACU RN and Post -op Vital signs reviewed and stable  Post vital signs: Reviewed and stable  Complications: No apparent anesthesia complications

## 2012-10-28 NOTE — Progress Notes (Signed)
Inpatient Diabetes Program Recommendations  AACE/ADA: New Consensus Statement on Inpatient Glycemic Control (2013)  Target Ranges:  Prepandial:   less than 140 mg/dL      Peak postprandial:   less than 180 mg/dL (1-2 hours)      Critically ill patients:  140 - 180 mg/dL   Results for VENUS, MORIARITY (MRN [id number]) as of 10/28/2012 12:09  Ref. Range 10/27/2012 06:47 10/27/2012 10:57 10/27/2012 15:54 10/27/2012 21:58 10/28/2012 06:35 10/28/2012 11:25  Glucose-Capillary Latest Range: 70-99 mg/dL 176 (H) 301 (H) 286 (H) 167 (H) 111 (H) 175 (H)    Inpatient Diabetes Program Recommendations Correction (SSI): While NPO, please consider changing frequency of CBGs and Novolog correction to Q4H.   Note: Blood glucose over the past 30 hours has ranged from 111-301 mg/dl.  Noted that patient is NPO for I&D today at 5:00pm with possible removal of hardware.  70/30 30 units BID is ordered along with Novolog 0-20 AC and Novolog 0-5 units HS for inpatient glycemic control.  Since patient is NPO, 70/30 8am dose is charted as not given by RN.  Please consider changing frequency of CBGs and Novolog correction to Q4H while NPO.  Will continue to follow.  Thanks, Barnie Alderman, RN, MSN, CCRN Diabetes Coordinator Inpatient Diabetes Program [phone]

## 2012-10-28 NOTE — Progress Notes (Signed)
Patient ID: [name]., male   DOB: 02-Mar-1946, 67 y.o.   MRN: [id number] Patient still has elevated WBC count. Plan for irrigation and debridement of his right foot. Possible hardware removal. Plan for placement of antibiotic beads. Surgery anticipated at 5 PM.

## 2012-10-28 NOTE — Preoperative (Addendum)
Beta Blockers   Lopressor 50 mgs taken on 10-28-12 @ 10:20

## 2012-10-28 NOTE — Op Note (Signed)
OPERATIVE REPORT  DATE OF SURGERY: 10/28/2012  PATIENT:  Devin Rakers.,  67 y.o. male  PRE-OPERATIVE DIAGNOSIS:  Infection Right Foot, Retained Hardware  POST-OPERATIVE DIAGNOSIS:  Infection Right Foot, Retained Hardware  PROCEDURE:  Procedure(s): IRRIGATION AND DEBRIDEMENT EXTREMITY Excision skin soft tissue and bone from the first metatarsal knee cuneiforms and navicular. Removal of deep retained hardware. Application of stimulant in antibiotic beads with 500 mg vancomycin and 160 mg gentamicin  SURGEON:  Surgeon(s): Newt Minion, MD  ANESTHESIA:   general  EBL:  min ML  SPECIMEN:  No Specimen  TOURNIQUET:  * No tourniquets in log *  PROCEDURE DETAILS: Patient is a 67 year old gentleman diabetic insensate neuropathy Charcot collapse with previous ulceration and osteomyelitis of the Charcot rocker-bottom deformity. Patient presented initially for bony excision stabilization of foot with internal fixation. Postoperatively patient progressed well but did develop an infection he was treated with IV antibiotics this did not resolve the infection patient presents at this time for surgical intervention. Risks and benefits were discussed including persistent infection nonhealing of the wounds need for an amputation. Patient states he understands and wished to proceed at this time. Description of procedure patient was brought to the operating room and underwent a general anesthetic. After adequate levels of anesthesia were obtained patient's right lower extremity was prepped using DuraPrep draped into a sterile field. Examination of foot he had essentially no drainage from the surgical incisions but he did have an abscess over the tarsal tunnel. Incision was made this was carried down to the abscess in the tarsal tunnel cultures were obtained the abscess was decompressed and irrigated. The surgical incision was opened necrotic bone was removed the end deep internal fixation hardware was  removed. This was all debrided back to bleeding viable bone and bleeding viable tissue. The distal incisions from the percutaneous incision was also debrided antibiotic beads were placed. All wounds were irrigated normal saline all wounds were packed with antibiotic beads using the gentamicin and vancomycin and stimulant powder. The surgical incisions were loosely closed with 2-0 nylon. There is no visible necrotic tissue or abscess at the time of closure. The wounds were covered Adaptic orthopedic sponges AB dressing Kerlix and Coban. Patient was extubated taken to the PACU in stable condition plan to continue IV antibiotics cultures pending but doubt these will show anything due to the patient's treatment with vancomycin and Cipro.  PLAN OF CARE: Admit to inpatient   PATIENT DISPOSITION:  PACU - hemodynamically stable.   Newt Minion, MD 10/28/2012 5:50 PM

## 2012-10-28 NOTE — Progress Notes (Signed)
OT Cancellation Note  Patient Details Name: [name]. MRN: [id number] DOB: 12/21/1945   Cancelled Treatment:    Reason Eval/Treat Not Completed: Medical issues which prohibited therapy  Pt going to OR today. Will re-attempt at later date.   Benito Mccreedy OTR/L [id number] 10/28/2012, 4:55 PM

## 2012-10-28 NOTE — Anesthesia Preprocedure Evaluation (Addendum)
Anesthesia Evaluation  Patient identified by MRN, date of birth, ID band Patient awake    Reviewed: Allergy & Precautions, H&P , NPO status , Patient's Chart, lab work & pertinent test results  Airway Mallampati: II  Neck ROM: full    Dental  (+) Partial Upper,    Pulmonary neg pulmonary ROS,  10-10-11 Chest x-ray Findings: Lungs are clear.  Heart size is normal.  No pneumothorax or pleural fluid.  Remote upper right rib fractures noted.   IMPRESSION: No acute finding.   breath sounds clear to auscultation        Cardiovascular Exercise Tolerance: Poor hypertension, Pt. on medications and Pt. on home beta blockers + Peripheral Vascular Disease Rhythm:Regular Rate:Normal  10-24-12 NSR -89 rate   Neuro/Psych Seizures -, Well Controlled,  CVA, No Residual Symptoms negative psych ROS   GI/Hepatic negative GI ROS, Neg liver ROS,   Endo/Other  diabetes, Type 2  Renal/GU Renal InsufficiencyRenal disease     Musculoskeletal  (+) Arthritis -, Osteoarthritis,    Abdominal   Peds  Hematology negative hematology ROS (+)   Anesthesia Other Findings L BKA  Reproductive/Obstetrics                      Anesthesia Physical Anesthesia Plan  ASA: III  Anesthesia Plan: General   Post-op Pain Management:    Induction: Intravenous  Airway Management Planned: LMA  Additional Equipment:   Intra-op Plan:   Post-operative Plan: Extubation in OR  Informed Consent: I have reviewed the patients History and Physical, chart, labs and discussed the procedure including the risks, benefits and alternatives for the proposed anesthesia with the patient or authorized representative who has indicated his/her understanding and acceptance.   Dental advisory given  Plan Discussed with: CRNA and Surgeon  Anesthesia Plan Comments:         Anesthesia Quick Evaluation

## 2012-10-28 NOTE — Anesthesia Procedure Notes (Signed)
Procedure Name: LMA Insertion Date/Time: 10/28/2012 5:15 PM Performed by: Wanita Chamberlain Pre-anesthesia Checklist: Patient identified, Timeout performed, Emergency Drugs available, Suction available and Patient being monitored Patient Re-evaluated:Patient Re-evaluated prior to inductionOxygen Delivery Method: Circle system utilized Preoxygenation: Pre-oxygenation with 100% oxygen Intubation Type: IV induction Ventilation: Mask ventilation without difficulty LMA: LMA with gastric port inserted Tube size: 4.0 mm Number of attempts: 1 Placement Confirmation: positive ETCO2 and breath sounds checked- equal and bilateral Tube secured with: Tape Dental Injury: Teeth and Oropharynx as per pre-operative assessment

## 2012-10-28 NOTE — Progress Notes (Signed)
PT Cancellation Note  Patient Details Name: [name]. MRN: [id number] DOB: [birth year]   Cancelled Treatment:    Reason Eval/Treat Not Completed: Medical issues which prohibited therapy (OR today)   Roney Marion [facility] 10/28/2012, 4:12 PM

## 2012-10-29 ENCOUNTER — Encounter: Payer: Medicare Other | Admitting: Vascular Surgery

## 2012-10-29 ENCOUNTER — Encounter (HOSPITAL_COMMUNITY): Payer: Self-pay | Admitting: Orthopedic Surgery

## 2012-10-29 DIAGNOSIS — L97909 Non-pressure chronic ulcer of unspecified part of unspecified lower leg with unspecified severity: Secondary | ICD-10-CM

## 2012-10-29 LAB — GLUCOSE, CAPILLARY
Glucose-Capillary: 296 mg/dL — ABNORMAL HIGH (ref 70–99)
Glucose-Capillary: 206 mg/dL — ABNORMAL HIGH (ref 70–99)
Glucose-Capillary: 201 mg/dL — ABNORMAL HIGH (ref 70–99)

## 2012-10-29 LAB — BASIC METABOLIC PANEL
BUN: 21 mg/dL (ref 6–23)
Chloride: 107 mEq/L (ref 96–112)
Creatinine, Ser: 1.46 mg/dL — ABNORMAL HIGH (ref 0.50–1.35)
GFR calc Af Amer: 56 mL/min — ABNORMAL LOW (ref >90)
GFR calc non Af Amer: 49 mL/min — ABNORMAL LOW (ref >90)

## 2012-10-29 LAB — PREPARE RBC (CROSSMATCH)

## 2012-10-29 LAB — CBC
HCT: 23.5 % — ABNORMAL LOW (ref 39.0–52.0)
MCHC: 34 g/dL (ref 30.0–36.0)
MCV: 84.8 fL (ref 78.0–100.0)
RDW: 13.7 % (ref 11.5–15.5)

## 2012-10-29 MED ORDER — SODIUM CHLORIDE 0.9 % IJ SOLN
10.0000 mL | INTRAMUSCULAR | Status: DC | PRN
  Administered 2012-10-30: 20 mL
  Administered 2012-11-01 (×2): 10 mL

## 2012-10-29 MED ORDER — INSULIN ASPART PROT & ASPART (70-30 MIX) 100 UNIT/ML ~~LOC~~ SUSP
22.0000 [IU] | Freq: Two times a day (BID) | SUBCUTANEOUS | Status: DC
  Administered 2012-10-30: 22 [IU] via SUBCUTANEOUS
  Filled 2012-10-29: qty 10

## 2012-10-29 NOTE — Progress Notes (Signed)
Patient ID: Devin Rakers., male   DOB: 1945-05-06, 67 y.o.   MRN: [id number] Postoperative day 1 status post removal of deep retained hardware debridement of abscess bony excision right foot. Of note patient had new areas of abscess of his foot not related to the surgical incision area. He had one focal area of abscess over the tarsal tunnel inferior to the medial malleolus.  Hemoglobin 8.0 will typed and crossed and transfused with 1 unit of packed red blood cells. Patient has anemia from chronic disease not related to acute blood loss anemia.  Patient scheduled for ankle brachial indices today. Okay to remove dressing to obtain ankle-brachial indices.  Intraoperative cultures were obtained of the abscess. I doubt this will show any positive cultures do to the patient's regimen of IV antibiotics.  Patient will need a PICC line and 6 weeks of home IV antibiotics to continue foot salvage intervention.  Patient has approximately 50% chance of foot salvage being successful.

## 2012-10-29 NOTE — Progress Notes (Signed)
Peripherally Inserted Central Catheter/Midline Placement  The IV Nurse has discussed with the patient and/or persons authorized to consent for the patient, the purpose of this procedure and the potential benefits and risks involved with this procedure.  The benefits include less needle sticks, lab draws from the catheter and patient may be discharged home with the catheter.  Risks include, but not limited to, infection, bleeding, blood clot (thrombus formation), and puncture of an artery; nerve damage and irregular heat beat.  Alternatives to this procedure were also discussed.  PICC/Midline Placement Documentation        Carita Sollars, Nicolette Bang 10/29/2012, 7:25 PM

## 2012-10-29 NOTE — Anesthesia Postprocedure Evaluation (Signed)
  Anesthesia Post-op Note  Patient: [name].  Procedure(s) Performed: Procedure(s) with comments: IRRIGATION AND DEBRIDEMENT EXTREMITY (Right) - Irrigation and Debridement Right Foot, Remove Deep Hardware, Place Antibiotic Beads  Patient Location: Nursing Unit  Anesthesia Type:General  Level of Consciousness: awake, alert  and oriented  Airway and Oxygen Therapy: Patient Spontanous Breathing  Post-op Pain: none  Post-op Assessment: Post-op Vital signs reviewed, Patient's Cardiovascular Status Stable, Respiratory Function Stable and Patent Airway  Post-op Vital Signs: Reviewed and stable  Complications: No apparent anesthesia complications

## 2012-10-29 NOTE — Progress Notes (Signed)
VASCULAR LAB PRELIMINARY  ARTERIAL  ABI completed:    RIGHT    LEFT    PRESSURE WAVEFORM  PRESSURE WAVEFORM  BRACHIAL 153 triphasic BRACHIAL 155 triphasic  DP   DP    AT 91 monophasic AT    PT 86 Dampened monophasic PT    PER   PER    GREAT TOE  NA GREAT TOE  NA    RIGHT LEFT  ABI 0.59 N/A     Marshell Rieger, RVT 10/29/2012, 3:20 PM

## 2012-10-29 NOTE — Progress Notes (Addendum)
[facility] Progress Note    [name]. [id number] DOB: 1945/09/24 DOA: 10/22/2012 PCP: [name], MD  Brief narrative: 67 y.o. male who presented with an episode of LOC / AMS. He was apparently on the toilet at home when his wife found him altered. Patient stated he remembered sitting down on the commode then next thing he knew there were people he didn't know in the room (EMS personell). Patient was slow to respond.  He had no headache, CP, SOB, or focal symptoms. Pt is s/p recent RLE foot surgery for a foot ulcer and charcot foot repair. Later during his course of stay in the ED the pt developed fever, tachypnea, and hypotension in addition to his WBC of 17k. He was also noted to have AKI with BUN of 100. Pt was initially admitted to step down unit and following stabilization was transferred to floor team 8/9. On follow up still had fevers, lactic acidosis, leukocytosis thought to be due to R. Foot infection so ortho took to OR for I&D 8/11  Assessment/Plan:  SIRS v/s sepsis - possible R foot surgical wound infection  Unclear source of infection, but likely from R foot wound - UA negative - blood cultures remain negative , but WBC remain markedly elevated - continue treating as wound infection as discussed w/ Ortho  - still with leukocytosis>> s/pr I&D per Dr [name] 8/11. -blood and wound cutures no growth so far - ORTHO recommends 6wks of abx -will place PICC Toxic metabolic encephalopathy/uremia Resolved - due to above + uremia   Acute on chronic anemia - ?acute blood loss Hgb 12.17 September 2012 - 9.23 September 2011 - Hgb nadir of 6.4 thus far - pt stated he had been having black stools at home but noted to be brown while in hospital - Hgb stable - continue to follow - Fe studies-mixed anemia - waiting on guiac stool  -being tansfused a unit of PRBCs today, follow and recheck.  anaphalaxis to PCN   AKI on CKD  Baseline GFR ~45 as of July 2014 and July 2013 - back to  baseline after fluid and blood infused, but now trending up>>will increase IVF today, follow and recheck   DM2 - uncontrolled  still poor BG control, Will increase 70/30, follow - adjust further \  Hx of HTN No change in tx plan today - follow   Sz d/o Dilantin dosing being adjusted by Rx  Metab acidosis -with controlled BG, due to lactic acidosis 2/2 #1- continue abx as abve, follow and recheck  Code Status: FULL Family Communication: none at bedside Disposition Plan: -likely to home with [facility] services when medically stable Consultants: Ortho - Dr. [name]  Procedures: I&D- per Dr [name] 8/11 ABI -pending  Antibiotics: Cipro 8/5 >> Vanc 8/5 >> 8/6 + 8/8 >>   HPI/Subjective: C/o pain in R heel, Denies abd pain.   Objective: Blood pressure 147/57, pulse 95, temperature 99.5 F (37.5 C), temperature source Oral, resp. rate 18, height 5\' 11"  (1.803 m), weight 81 kg (178 lb 9.2 oz), SpO2 98.00%.  Intake/Output Summary (Last 24 hours) at 10/29/12 1102 Last data filed at 10/29/12 0500  Gross per 24 hour  Intake    300 ml  Output   1275 ml  Net   -975 ml    Exam: General: No acute respiratory distress Lungs: Clear to auscultation bilaterally without wheezes or crackles Cardiovascular: Regular rate and rhythm without murmur gallop or rub normal S1 and S2 Abdomen: Nontender, nondistended,  soft, bowel sounds positive, no rebound, no ascites, no appreciable mass Extremities: No significant cyanosis, clubbing, or edema bilateral lower extremities - R foot with dressing clean and dry L. BKA  Data Reviewed: Basic Metabolic Panel:  Recent Labs Lab 10/23/12 0455 10/24/12 0500 10/25/12 0650 10/26/12 0505 10/27/12 0545 10/28/12 0528 10/29/12 0540  NA 132* 138 139 138 137 135 134*  K 4.7 4.9 4.8 4.2 4.1 4.6 4.5  CL 105 109 111 111 109 109 107  CO2 15* 17* 17* 18* 16* 16* 15*  GLUCOSE 309* 205* 278* 153* 138* 127* 256*  BUN 96* 59* 34* 21 20 21 21   CREATININE 2.61* 1.66*  1.47* 1.34 1.35 1.46* 1.46*  CALCIUM 8.2* 9.1 9.1 8.8 8.6 8.5 8.2*  PHOS  --  3.0  --   --   --   --   --    Liver Function Tests:  Recent Labs Lab 10/23/12 1925 10/24/12 0500  ALBUMIN 1.7* 1.9*   CBC:  Recent Labs Lab 10/23/12 0005  10/25/12 1400 10/26/12 0505 10/27/12 0545 10/28/12 0528 10/29/12 0540  WBC 17.0*  < > 18.7* 19.2* 17.2* 16.7* 17.3*  NEUTROABS 14.7*  --   --   --   --   --   --   HGB 7.8*  < > 8.5* 8.5* 8.2* 8.3* 8.0*  HCT 22.9*  < > 24.8* 24.7* 24.3* 24.5* 23.5*  MCV 83.0  < > 85.8 86.1 85.9 84.5 84.8  PLT 539*  < > 426* 422* 397 396 357  < > = values in this interval not displayed. CBG:  Recent Labs Lab 10/28/12 1125 10/28/12 1642 10/28/12 1759 10/28/12 2137 10/29/12 0634  GLUCAP 175* 160* 151* 232* 296*    Recent Results (from the past 240 hour(s))  CULTURE, BLOOD (ROUTINE X 2)     Status: None   Collection Time    10/23/12  4:15 AM      Result Value Range Status   Specimen Description BLOOD LEFT ARM   Final   Special Requests BOTTLES DRAWN AEROBIC AND ANAEROBIC 10CC EACH   Final   Culture  Setup Time     Final   Value: 10/23/2012 10:20     Performed at [facility]   Culture     Final   Value: NO GROWTH 5 DAYS     Performed at [facility]   Report Status 10/28/2012 FINAL   Final  CULTURE, BLOOD (ROUTINE X 2)     Status: None   Collection Time    10/23/12  4:15 AM      Result Value Range Status   Specimen Description BLOOD LEFT HAND   Final   Special Requests BOTTLES DRAWN AEROBIC ONLY 10CC   Final   Culture  Setup Time     Final   Value: 10/23/2012 10:19     Performed at [facility]   Culture     Final   Value: NO GROWTH 5 DAYS     Performed at [facility]   Report Status 10/28/2012 FINAL   Final  MRSA PCR SCREENING     Status: None   Collection Time    10/23/12  5:41 AM      Result Value Range Status   MRSA by PCR NEGATIVE  NEGATIVE Final   Comment:            The GeneXpert MRSA Assay (FDA      approved for NASAL specimens  only), is one component of a     comprehensive MRSA colonization     surveillance program. It is not     intended to diagnose MRSA     infection nor to guide or     monitor treatment for     MRSA infections.  CULTURE, ROUTINE-ABSCESS     Status: None   Collection Time    10/28/12  5:23 PM      Result Value Range Status   Specimen Description ABSCESS FOOT RIGHT   Final   Special Requests NONE POF VANCOMYCIN CIPRO   Final   Gram Stain     Final   Value: NO WBC SEEN     NO SQUAMOUS EPITHELIAL CELLS SEEN     NO ORGANISMS SEEN     Performed at [facility]   Culture PENDING   Incomplete   Report Status PENDING   Incomplete     Studies:  I have reviewed all Recent x-ray studies in detail   Scheduled Meds:  Scheduled Meds: . atorvastatin  20 mg Oral Daily  . ciprofloxacin  400 mg Intravenous Q12H  . insulin aspart  0-20 Units Subcutaneous TID WC  . insulin aspart  0-5 Units Subcutaneous QHS  . insulin aspart protamine- aspart  20 Units Subcutaneous BID WC  . metoprolol  25 mg Oral BID  . phenytoin  200 mg Oral BID  . polyethylene glycol  17 g Oral Daily  . senna-docusate  1 tablet Oral BID  . sodium chloride  3 mL Intravenous Q12H  . timolol  1 drop Both Eyes Daily  . Travoprost (BAK Free)  1 drop Both Eyes QHS  . vancomycin  750 mg Intravenous Q12H    Time spent on care of this patient: 25 mins   Sheila O[place]  [phone] Pager - Text Page per Shea Evans as per below:  On-Call/Text Page:      [email or web address]      password [email or web address]  If 7PM-7AM, please contact night-coverage [email or web address] Password [email or web address] 10/29/2012, 11:02 AM   LOS: 7 days

## 2012-10-29 NOTE — Progress Notes (Signed)
[facility]  Patient Status: Active Pt with [facility] up to this readmission  [facility] is providing the following services:   PT services and will now provide [place] for home IV ABX.  Spoke with pt regarding plans for home IV ABX.  Pt and his wife have administered IV ABX at home with [facility] in the past and will need refresher teaching which we will do.  [place] Team will follow while inpatient to support transition home.  If patient discharges after hours, please call [phone].   Larry Sierras 10/29/2012, 12:32 PM

## 2012-10-29 NOTE — Evaluation (Addendum)
Occupational Therapy Evaluation Patient Details Name: [name]. MRN: [id number] DOB: [birth year] Today's Date: 10/29/2012 Time: [id number] OT Time Calculation (min): 25 min  OT Assessment / Plan / Recommendation History of present illness Pt admitted with severe sepsis with acute organ dysfunction s/p deep hardware removal and debridement of abscess of bony excision on R foot.   Clinical Impression   Pt admitted with above and presents below problem list. Pt will benefit from OT services to maximize independence and safety to enable pt to D/C home. Wife assisted pt with prosthetic PTA.  Recommend HHOT upon d/c with 24 hour supervision.      OT Assessment  Patient needs continued OT Services    Follow Up Recommendations  Home health OT;Supervision/Assistance - 24 hour    Barriers to Discharge      Equipment Recommendations  None recommended by OT    Recommendations for Other Services    Frequency  Min 2X/week    Precautions / Restrictions Precautions Precautions: Fall Restrictions Weight Bearing Restrictions: Yes RLE Weight Bearing: Non weight bearing LLE Weight Bearing: Weight bearing as tolerated   Pertinent Vitals/Pain 3/10 pain in right heel. Repositioned.     ADL  Eating/Feeding: Independent Where Assessed - Eating/Feeding: Chair Grooming: Set up Where Assessed - Grooming: Supported sitting Upper Body Bathing: Set up;Supervision/safety Where Assessed - Upper Body Bathing: Supported sitting Lower Body Bathing: Maximal assistance Where Assessed - Lower Body Bathing: Supported sit to stand Upper Body Dressing: Set up;Supervision/safety Where Assessed - Upper Body Dressing: Supported sitting Lower Body Dressing: Maximal assistance Where Assessed - Lower Body Dressing: Supported sit to Lobbyist: Simulated;+2 Total assistance;Minimal assistance Toilet Transfer: Patient Percentage: 50% Armed forces technical officer Method: Sit to stand (+2 total A from recliner  chair; Min A from elevated bed) Science writer: Other (comment) (from recliner chair and elevated bed) Tub/Shower Transfer Method: Not assessed Equipment Used: Gait belt;Rolling walker Transfers/Ambulation Related to ADLs: Took a few steps to bed- Min A. +2 total A (50%) for sit to stand from recliner chair. Min A for sit to stand from elevated bed. [place] for stand to sit to elevated bed. ADL Comments: Pt overall Max A for LB dressing/bathing. Assistance required to don prosthesis.  PT/OT educated on chair push-ups to increase UB strength to help with transfers.    OT Diagnosis: Acute pain  OT Problem List: Decreased strength;Decreased activity tolerance;Impaired balance (sitting and/or standing);Decreased knowledge of use of DME or AE;Decreased knowledge of precautions;Pain;Decreased range of motion OT Treatment Interventions: Self-care/ADL training;DME and/or AE instruction;Therapeutic activities;Patient/family education;Balance training   OT Goals(Current goals can be found in the care plan section) Acute Rehab OT Goals Patient Stated Goal: Pt would like to return home. OT Goal Formulation: With patient Time For Goal Achievement: 11/05/12 Potential to Achieve Goals: Good ADL Goals Pt Will Perform Lower Body Bathing: sit to/from stand;with min assist Pt Will Perform Lower Body Dressing: with min assist;sit to/from stand Pt Will Transfer to Toilet: with modified independence (3 in 1 over commode) Pt Will Perform Toileting - Clothing Manipulation and hygiene: with min guard assist;sit to/from stand;sitting/lateral leans Pt Will Perform Tub/Shower Transfer: Shower transfer;with min assist;ambulating;shower seat;rolling walker Additional ADL Goal #1: Pt will perform UE exercises to increase strength.   Visit Information  Last OT Received On: 10/29/12 Assistance Needed: +1 History of Present Illness: Pt admitted with severe sepsis with acute organ dysfunction s/p deep hardware  removal and debridement of abscess of bony excision on R foot.  Prior [place] expects to be discharged to:: Private residence Living Arrangements: Spouse/significant other Available Help at Discharge: Family;Available 24 hours/day Type of Home: House Home Access: Stairs to enter [employer] of Steps: 2 Entrance Stairs-Rails: None Home Layout: One level Home Equipment: Bedside commode;Shower seat Prior Function Level of Independence: Needs assistance ADL's / Homemaking Assistance Needed: wife helped pt with prosthetic Comments: Pt's bedroom setup at home allows RW to be secured and pt uses UEs to pull up to stand. Pt uses head resting against wall to assist with standing balance. Pt hops on LLE with RW to transfer. Pt also uses W/C part-time around home. Communication Communication: No difficulties         Vision/Perception     Cognition  Cognition Arousal/Alertness: Awake/alert Behavior During Therapy: WFL for tasks assessed/performed Overall Cognitive Status: Within Functional Limits for tasks assessed    Extremity/Trunk Assessment Upper Extremity Assessment Upper Extremity Assessment: RUE deficits/detail RUE Deficits / Details: Pt reports difficulty with right hand grip due to arthritis. (Pt's RUE slipped off chair armrest while doing chair push-up)     Mobility Bed Mobility Bed Mobility: Sit to Supine Sit to Supine: 6: Modified independent (Device/Increase time) Details for Bed Mobility Assistance: Pt manages well with sit to supine. Pt able to elevate RLE and swing into bed. Transfers Transfers: Sit to Stand;Stand to Sit Sit to Stand: 1: +2 Total assist;From chair/3-in-1;With upper extremity assist;With armrests;From bed;4: Min assist;From elevated surface Stand to Sit: 4: Min guard;With upper extremity assist;To elevated surface;To bed Details for Transfer Assistance: Cues for hand placement. Assisted with  donning and doffing prosthetic. Pt had more difficulty with sit to stand from chair and was able to sit to stand from bed more efficiently.      Exercise     Balance     End of Session OT - End of Session Equipment Utilized During Treatment: Gait belt;Rolling walker;Other (comment) (prosthetic) Activity Tolerance: Patient tolerated treatment well Patient left: in bed;with call bell/phone within reach  [place], [place] L OTR/L [id number] 10/29/2012, 4:22 PM

## 2012-10-29 NOTE — Evaluation (Signed)
Physical Therapy Evaluation Patient Details Name: [name]. MRN: [id number] DOB: [birth year] Today's Date: 10/29/2012 Time: 1209-1239 PT Time Calculation (min): 30 min  PT Assessment / Plan / Recommendation History of Present Illness  Pt admitted with severe sepsis with acute organ dysfunction s/p deep hardware removal and debridement of abscess of bony excision on R foot.  Clinical Impression  Pt admitted with above and presents with decreased strength, decreased functional mobility, and pain in RLE. Pt will benefit from PT services to maximize independence and safety with functional mobility to enable pt to D/C home.   In this session, pt demonstrated how he typically performs transfers at home. PT assisted with donning and doffing prosthetic and pt reported his wife typically helps with doffing. Pt was able to perform transfers while elevating RLE to comply with NWB status. Pt instructed to perform chair push-ups 10 times per transfer to chair. Discussed theraband exercises with pt and plan to perform next session.   Pt is appropriate to D/C home safely with HHPT and 24 hr supervision.     PT Assessment  Patient needs continued PT services    Follow Up Recommendations  Home health PT;Supervision/Assistance - 24 hour          Equipment Recommendations  None recommended by PT       Frequency Min 5X/week    Precautions / Restrictions Precautions Precautions: Fall Restrictions Weight Bearing Restrictions: Yes RLE Weight Bearing: Non weight bearing LLE Weight Bearing: Weight bearing as tolerated   Pertinent Vitals/Pain 3/10 in right heel      Mobility  Bed Mobility Bed Mobility: Sit to Supine Sit to Supine: 6: Modified independent (Device/Increase time) Details for Bed Mobility Assistance: Pt manages well with sit to supine. Pt able to elevate RLE and swing into bed. Transfers Transfers: Sit to Stand;Stand to Sit Sit to Stand: 1: +2 Total assist;From  chair/3-in-1;With upper extremity assist;With armrests;From bed;4: Min assist;From elevated surface Stand to Sit: 4: Min guard;With upper extremity assist;To elevated surface;To bed Details for Transfer Assistance: Cues for hand placement. Assisted with donning and doffing prosthetic. Pt had more difficulty with sit to stand from chair and was able to sit to stand from bed more efficiently.  Ambulation/Gait Assistive device: Rolling walker        PT Diagnosis: Difficulty walking;Generalized weakness;Acute pain  PT Problem List: Decreased strength;Decreased activity tolerance;Decreased range of motion;Decreased mobility;Pain; Decreased balance PT Treatment Interventions: DME instruction;Gait training;Stair training;Functional mobility training;Therapeutic activities;Therapeutic exercise;Patient/family education;Wheelchair mobility training     PT Goals(Current goals can be found in the care plan section) Acute Rehab PT Goals Patient Stated Goal: Pt would like to return home. PT Goal Formulation: With patient Time For Goal Achievement: 11/05/12 Potential to Achieve Goals: Good  Visit Information  Last PT Received On: 10/29/12 Assistance Needed: +1 PT/OT Co-Evaluation/Treatment: Yes History of Present Illness: Pt admitted with severe sepsis with acute organ dysfunction s/p deep hardware removal and debridement of abscess of bony excision on R foot.       Prior [place] expects to be discharged to:: Private residence Living Arrangements: Spouse/significant other Available Help at Discharge: Family;Available 24 hours/day Type of Home: House Home Access: Stairs to enter [employer] of Steps: 2 Entrance Stairs-Rails: None Home Layout: One level Home Equipment: Bedside commode;Shower seat Prior Function Level of Independence: Independent with assistive device(s) Comments: Pt's bedroom setup at home allows RW to be secured and pt uses UEs to  pull up to stand.  Pt uses head resting against wall to assist with standing balance. Pt hops on LLE with RW to transfer. Pt also uses W/C part-time around home. Communication Communication: No difficulties    Cognition  Cognition Arousal/Alertness: Awake/alert Behavior During Therapy: WFL for tasks assessed/performed Overall Cognitive Status: Within Functional Limits for tasks assessed    Extremity/Trunk Assessment Upper Extremity Assessment Upper Extremity Assessment: RUE deficits/detail RUE Deficits / Details: Pt reports difficulty with right hand grip due to arthritis. (Pt's RUE did slip off chair armrest while doing chair push-u) Lower Extremity Assessment Lower Extremity Assessment: RLE deficits/detail RLE Deficits / Details: Pt reports pain in heel.  RLE Sensation: history of peripheral neuropathy Cervical / Trunk Assessment Cervical / Trunk Assessment: Normal      End of Session PT - End of Session Equipment Utilized During Treatment: Gait belt Activity Tolerance: Patient tolerated treatment well Patient left: in bed;with call bell/phone within reach Nurse Communication: Mobility status  GP     [name] 10/29/2012, 1:42 PM  Roney [place], [place]

## 2012-10-30 DIAGNOSIS — I70269 Atherosclerosis of native arteries of extremities with gangrene, unspecified extremity: Secondary | ICD-10-CM

## 2012-10-30 DIAGNOSIS — N289 Disorder of kidney and ureter, unspecified: Secondary | ICD-10-CM

## 2012-10-30 LAB — BASIC METABOLIC PANEL
BUN: 20 mg/dL (ref 6–23)
Calcium: 8.3 mg/dL — ABNORMAL LOW (ref 8.4–10.5)
Chloride: 109 mEq/L (ref 96–112)
Creatinine, Ser: 1.58 mg/dL — ABNORMAL HIGH (ref 0.50–1.35)
GFR calc Af Amer: 51 mL/min — ABNORMAL LOW (ref >90)

## 2012-10-30 LAB — GLUCOSE, CAPILLARY
Glucose-Capillary: 179 mg/dL — ABNORMAL HIGH (ref 70–99)
Glucose-Capillary: 212 mg/dL — ABNORMAL HIGH (ref 70–99)
Glucose-Capillary: 180 mg/dL — ABNORMAL HIGH (ref 70–99)

## 2012-10-30 LAB — CBC
HCT: 24.2 % — ABNORMAL LOW (ref 39.0–52.0)
MCH: 28.7 pg (ref 26.0–34.0)
MCV: 82.6 fL (ref 78.0–100.0)
RDW: 14.1 % (ref 11.5–15.5)
WBC: 14.9 10*3/uL — ABNORMAL HIGH (ref 4.0–10.5)

## 2012-10-30 LAB — TYPE AND SCREEN
ABO/RH(D): O POS
Antibody Screen: NEGATIVE

## 2012-10-30 MED ORDER — INSULIN ASPART PROT & ASPART (70-30 MIX) 100 UNIT/ML ~~LOC~~ SUSP
25.0000 [IU] | Freq: Two times a day (BID) | SUBCUTANEOUS | Status: DC
  Administered 2012-10-30 – 2012-11-01 (×4): 25 [IU] via SUBCUTANEOUS

## 2012-10-30 NOTE — Progress Notes (Signed)
Patient ID: [name]., male   DOB: Oct 20, 1945, 67 y.o.   MRN: [id number] Postoperative day 2 status post surgical intervention for infection right foot. Ankle-brachial indices dampened monophasic with ABI 0.59. Hemoglobin essentially unchanged after transfusion with one unit packed red blood cells. White blood cell count decreased. Plan for discharge to home on IV antibiotics.  Recommend vascular surgery consultation for evaluation for possible revascularization left lower extremity.

## 2012-10-30 NOTE — Progress Notes (Signed)
10-30-12 09:54 Pt had prior [facility] and will be probable IV ABX. [facility] aware. Mariane Masters, BSN, [place]

## 2012-10-30 NOTE — Consult Note (Signed)
Vascular Surgery Consultation  Reason for Consult: Diabetes mellitus with infected right foot  HPI: [name]. is a 67 y.o. male who presents for evaluation of her infected right foot. This patient with insulin-dependent diabetes mellitus has had previous left below-knee" 2-3 years ago by Dr. [name]. He has also had second third and fourth toes amputated from the right foot. He presented with a foot infection was treated with debridement by Dr. [name]. He has been ambulating well with a below-knee prosthesis on the left the past few years.   Past Medical History  Diagnosis Date  . Diabetes mellitus   . Hypertension   . Seizures   . Stroke   . Peripheral vascular disease   . CKD (chronic kidney disease)     Stage II (per 06/2010 notes)  . Neuromuscular disorder    Past Surgical History  Procedure Laterality Date  . Colon surgery      partial colectomy  . Eye surgery      bilat cataract surgery  . Below knee leg amputation      L  . Toe amputation      R two  . Amputation  10/10/2011    Procedure: AMPUTATION DIGIT;  Surgeon: Newt Minion, MD;  Location: [place];  Service: Orthopedics;  Laterality: Right;  Right Foot 4th Toe Amputation  . Orif toe fracture Right 10/09/2012    Procedure: OPEN REDUCTION INTERNAL FIXATION (ORIF) METATARSAL (TOE) FRACTURE;  Surgeon: Newt Minion, MD;  Location: [place];  Service: Orthopedics;  Laterality: Right;  Right Foot Base 1st Metatarsal and Medial Cuneoform Excision, Internal Fixation, Antibiotic Beads  . I&d extremity Right 10/28/2012    Procedure: IRRIGATION AND DEBRIDEMENT EXTREMITY;  Surgeon: Newt Minion, MD;  Location: [place];  Service: Orthopedics;  Laterality: Right;  Irrigation and Debridement Right Foot, Remove Deep Hardware, Place Antibiotic Beads   History   Social History  . Marital Status: Married    Spouse Name: N/A    Number of Children: N/A  . Years of Education: N/A   Social History Main Topics  . Smoking status: Never  Smoker   . Smokeless tobacco: Never Used  . Alcohol Use: 0.6 oz/week    1 Glasses of wine per week     Comment: seldom  . Drug Use: No  . Sexual Activity: None   Other Topics Concern  . None   Social History Narrative  . None   No family history on file. Allergies  Allergen Reactions  . Codeine Anaphylaxis  . Penicillins Anaphylaxis   Prior to Admission medications   Medication Sig Start Date End Date Taking? Authorizing Provider  aspirin 325 MG tablet Take 325 mg by mouth daily.   Yes Historical Provider, MD  atorvastatin (LIPITOR) 20 MG tablet Take 20 mg by mouth daily.   Yes Historical Provider, MD  insulin aspart protamine-insulin aspart (NOVOLOG 70/30) (70-30) 100 UNIT/ML injection Inject 15 Units into the skin 2 (two) times daily with a meal.    Yes Historical Provider, MD  metoprolol (LOPRESSOR) 50 MG tablet Take 25 mg by mouth 2 (two) times daily.   Yes Historical Provider, MD  olmesartan-hydrochlorothiazide (BENICAR HCT) 20-12.5 MG per tablet Take 1 tablet by mouth every morning.    Yes Historical Provider, MD  phenytoin (DILANTIN) 100 MG ER capsule Take 300 mg by mouth at bedtime.   Yes Historical Provider, MD  timolol (BETIMOL) 0.5 % ophthalmic solution Place 1 drop into both eyes every  morning.    Yes Historical Provider, MD  torsemide (DEMADEX) 20 MG tablet Take 10 mg by mouth 2 (two) times daily.    Yes Historical Provider, MD  traMADol (ULTRAM) 50 MG tablet Take 1 tablet (50 mg total) by mouth every 6 (six) hours as needed for pain. Maximum dose= 8 tablets per day 10/10/12  Yes Newt Minion, MD  Travoprost, BAK Free, ([name]) 0.004 % SOLN ophthalmic solution Place 1 drop into both eyes at bedtime.   Yes Historical Provider, MD     Positive ROS: Denies chest pain, dyspnea on exertion, PND, orthopnea. Does have a history of stroke he states but has no deficits. Denies lateralizing weakness, aphasia, amaurosis fugax, diplopia, blurred vision, syncope. All other  systems negative and complete review of systems  All other systems have been reviewed and were otherwise negative with the exception of those mentioned in the HPI and as above.  Physical Exam: Filed Vitals:   10/30/12 0547  BP: 149/64  Pulse: 84  Temp: 98.6 F (37 C)  Resp: 16    General: Alert, no acute distress HEENT: Normal for age Cardiovascular: Regular rate and rhythm. Carotid pulses 2+, no bruits audible Respiratory: Clear to auscultation. No cyanosis, no use of accessory musculature GI: No organomegaly, abdomen is soft and non-tender Skin: No lesions in the area of chief complaint Neurologic: Sensation intact distally Psychiatric: Patient is competent for consent with normal mood and affect   Musculoskeletal: No obvious deformities Extremities: Left leg with 3+ femoral and popliteal pulse palpable and below-knee amputation prosthesis Right leg with 3+ femoral and popliteal pulse palpable surgical dressing right foot in place. No evidence of proximal calf infection or cellulitis.  Labs reviewed: ABI right lower extremity 0.59    Assessment/Plan:  This patient with type 1 diabetes mellitus has tibial occlusive disease and right lower extremity and also likely this led to his left below-knee amputation in the past. He has required 3 toes amputated in the right foot and now has infection. It is doubtful that he will have any disease process  Amenable to percutaneous intervention or bypass. Will however obtain right lower extremity angiogram in case he does have a situation which could be improved to increase likelihood of limb salvage we'll try to schedule this for Thursday but if not will obtain this on Friday. Discussed with patient and he is agreeable  Tinnie Gens, MD 10/30/2012 1:06 PM

## 2012-10-30 NOTE — Progress Notes (Signed)
Reviewed and agree;  Nikitia Asbill, PT [phone]  

## 2012-10-30 NOTE — Progress Notes (Signed)
Physical Therapy Treatment Patient Details Name: [name]. MRN: [id number] DOB: [birth year] Today's Date: 10/30/2012 Time: [id number] PT Time Calculation (min): 30 min  PT Assessment / Plan / Recommendation  History of Present Illness Pt admitted with severe sepsis with acute organ dysfunction s/p deep hardware removal and debridement of abscess of bony excision on R foot.   PT Comments   Pt admitted with above and pt initially did not want to work with PT, having received prognosis regarding salvaging his RLE. Pt had requested theraband for general UE exercises, and was agreeable to proceed with that today. PT taught and completed UE exercises with blue theraband. Pt was eager to learn exercises and eager to perform more repetitions.   D/C home with HHPT remains appropriate.    Follow Up Recommendations  Home health PT;Supervision/Assistance - 24 hour           Equipment Recommendations  None recommended by PT       Frequency Min 5X/week   Progress towards PT Goals Progress towards PT goals: Progressing toward goals (Goal set today per patient request.)  Plan Current plan remains appropriate    Precautions / Restrictions Precautions Precautions: Fall Restrictions Weight Bearing Restrictions: Yes RLE Weight Bearing: Non weight bearing LLE Weight Bearing: Weight bearing as tolerated   Pertinent Vitals/Pain        Exercises General Exercises - Upper Extremity Shoulder ABduction: AROM;Both;10 reps;Seated;Theraband Theraband Level (Shoulder Abduction): Level 4 (Blue) Shoulder ADduction: AROM;Both;10 reps;Seated;Theraband (Shoulder internal rotation) Theraband Level (Shoulder Adduction): Level 4 (Blue) Elbow Flexion: AROM;Both;10 reps;Seated;Theraband Theraband Level (Elbow Flexion): Level 4 (Blue) Elbow Extension: AROM;Both;10 reps;Seated;Theraband Theraband Level (Elbow Extension): Level 4 (Blue) General Exercise Comments: Pt needed repetitive cueing for form, and  needs reinforcement. PT did not heavily reinforce because pt seemed to simply enjoy the activity.     PT Goals (current goals can now be found in the care plan section) Acute Rehab PT Goals Patient Stated Goal: Pt would like to return home. PT Goal Formulation: With patient Time For Goal Achievement: 11/05/12 Potential to Achieve Goals: Good  Visit Information  Last PT Received On: 10/30/12 Assistance Needed: +1 History of Present Illness: Pt admitted with severe sepsis with acute organ dysfunction s/p deep hardware removal and debridement of abscess of bony excision on R foot.    Subjective Data  Patient Stated Goal: Pt would like to return home.   Cognition  Cognition Arousal/Alertness: Awake/alert Behavior During Therapy: WFL for tasks assessed/performed Overall Cognitive Status: Within Functional Limits for tasks assessed       End of Session PT - End of Session Activity Tolerance: Patient tolerated treatment well Patient left: in chair;with call bell/phone within reach;with family/visitor present   GP     [name] 10/30/2012, 2:01 PM

## 2012-10-30 NOTE — Progress Notes (Signed)
ANTIBIOTIC CONSULT NOTE - FOLLOW UP  Pharmacy Consult for vancomycin and ciprofloxacin Indication: right foot infection s/p surgery  Allergies  Allergen Reactions  . Codeine Anaphylaxis  . Penicillins Anaphylaxis    Patient Measurements: Height: 5\' 11"  (180.3 cm) Weight: 178 lb 9.2 oz (81 kg) IBW/kg (Calculated) : 75.3  Vital Signs: Temp: 98.6 F (37 C) (08/13 0547) BP: 149/64 mmHg (08/13 0547) Pulse Rate: 84 (08/13 0547) Intake/Output from previous day: 08/12 0701 - 08/13 0700 In: 805 [P.O.:240; I.V.:240; Blood:325] Out: 850 [Urine:850] Intake/Output from this shift:    Labs:  Recent Labs  10/28/12 0528 10/29/12 0540 10/30/12 0400  WBC 16.7* 17.3* 14.9*  HGB 8.3* 8.0* 8.4*  PLT 396 357 311  CREATININE 1.46* 1.46* 1.58*   Estimated Creatinine Clearance: 49.6 ml/min (by C-G formula based on Cr of 1.58).   Microbiology: Recent Results (from the past 720 hour(s))  CULTURE, BLOOD (ROUTINE X 2)     Status: None   Collection Time    10/23/12  4:15 AM      Result Value Range Status   Specimen Description BLOOD LEFT ARM   Final   Special Requests BOTTLES DRAWN AEROBIC AND ANAEROBIC 10CC EACH   Final   Culture  Setup Time     Final   Value: 10/23/2012 10:20     Performed at [facility]   Culture     Final   Value: NO GROWTH 5 DAYS     Performed at [facility]   Report Status 10/28/2012 FINAL   Final  CULTURE, BLOOD (ROUTINE X 2)     Status: None   Collection Time    10/23/12  4:15 AM      Result Value Range Status   Specimen Description BLOOD LEFT HAND   Final   Special Requests BOTTLES DRAWN AEROBIC ONLY 10CC   Final   Culture  Setup Time     Final   Value: 10/23/2012 10:19     Performed at [facility]   Culture     Final   Value: NO GROWTH 5 DAYS     Performed at [facility]   Report Status 10/28/2012 FINAL   Final  MRSA PCR SCREENING     Status: None   Collection Time    10/23/12  5:41 AM      Result Value Range  Status   MRSA by PCR NEGATIVE  NEGATIVE Final   Comment:            The GeneXpert MRSA Assay (FDA     approved for NASAL specimens     only), is one component of a     comprehensive MRSA colonization     surveillance program. It is not     intended to diagnose MRSA     infection nor to guide or     monitor treatment for     MRSA infections.  ANAEROBIC CULTURE     Status: None   Collection Time    10/28/12  5:23 PM      Result Value Range Status   Specimen Description ABSCESS FOOT RIGHT   Final   Special Requests NONE POF VANCOMYCIN CIPRO   Final   Gram Stain PENDING   Incomplete   Culture     Final   Value: NO ANAEROBES ISOLATED; CULTURE IN PROGRESS FOR 5 DAYS     Performed at [facility]   Report Status PENDING   Incomplete  CULTURE, ROUTINE-ABSCESS  Status: None   Collection Time    10/28/12  5:23 PM      Result Value Range Status   Specimen Description ABSCESS FOOT RIGHT   Final   Special Requests NONE POF VANCOMYCIN CIPRO   Final   Gram Stain     Final   Value: NO WBC SEEN     NO SQUAMOUS EPITHELIAL CELLS SEEN     NO ORGANISMS SEEN     Performed at [facility]   Culture     Final   Value: MODERATE STAPHYLOCOCCUS AUREUS     Note: RIFAMPIN AND GENTAMICIN SHOULD NOT BE USED AS SINGLE DRUGS FOR TREATMENT OF STAPH INFECTIONS.     Performed at [facility]   Report Status PENDING   Incomplete    Anti-infectives   Start     Dose/Rate Route Frequency Ordered Stop   10/28/12 1731  vancomycin (VANCOCIN) powder  Status:  Discontinued       As needed 10/28/12 1732 10/28/12 1753   10/28/12 1730  gentamicin (GARAMYCIN) injection  Status:  Discontinued       As needed 10/28/12 1731 10/28/12 1753   10/25/12 1000  vancomycin (VANCOCIN) IVPB 750 mg/150 ml premix     750 mg 150 mL/hr over 60 Minutes Intravenous Every 12 hours 10/25/12 0941     10/25/12 0500  vancomycin (VANCOCIN) IVPB 1000 mg/200 mL premix  Status:  Discontinued     1,000 mg 200 mL/hr  over 60 Minutes Intravenous Every 48 hours 10/23/12 0439 10/23/12 1156   10/24/12 1800  ciprofloxacin (CIPRO) IVPB 400 mg     400 mg 200 mL/hr over 60 Minutes Intravenous Every 12 hours 10/24/12 1310     10/23/12 0500  ciprofloxacin (CIPRO) IVPB 400 mg  Status:  Discontinued     400 mg 200 mL/hr over 60 Minutes Intravenous Every 24 hours 10/23/12 0439 10/24/12 1310   10/23/12 0500  vancomycin (VANCOCIN) 1,500 mg in sodium chloride 0.9 % 500 mL IVPB     1,500 mg 250 mL/hr over 120 Minutes Intravenous  Once 10/23/12 0439 10/23/12 0655     Assessment: 39 YOM s/p surgery for right foot infection continues on antibiotics. PICC line placed yesterday for home antibiotics. Renal function has remained stable. Last vanc trough was drawn on the 10th and was at goal at 18.9. No changes made. Cultures from the abscess on the 11th show moderate staph auerus.  Goal of Therapy:  Vancomycin trough level 15-20 mcg/ml  Plan:  1. Continue vancomycin 750mg  IV Q12h 2. Continue ciprofloxacin 400mg  IV Q12h 3. Follow c/s, dispo plans, renal function, WBC and fever  Anup Brigham D. Jaysean Manville, PharmD Clinical Pharmacist Pager: [phone] 10/30/2012 10:45 AM

## 2012-10-30 NOTE — Progress Notes (Signed)
OT Cancellation Note  Patient Details Name: [name]. MRN: [id number] DOB: 1946-01-07   Cancelled Treatment:    Reason Eval/Treat Not Completed: Other (comment) (pt requests OT try back later time. Just finished lunch)  Jules Schick [phone] 10/30/2012, 12:20 PM

## 2012-10-30 NOTE — Progress Notes (Signed)
Patient ID: [name]  male  [id number]    DOB: 1945/06/06    DOA: 10/22/2012  PCP: [name], MD  Assessment/Plan:   SIRS v/s sepsis - possible R foot surgical wound infection: Unclear source of infection, but likely from R foot wound - UA negative - blood cultures remain negative , but WBC was elevated, trending down  - still with leukocytosis>> s/pr I&D per Dr [name] 8/11.  - Cultures showed moderate staph aureus, sensitivities pending - ORTHO recommends 6wks of abx,  PICC line placed  - Ankle-brachial indices dampened monophasic with ABI 0.59,  vascular surgery consult obtained - Vascular surgery planning right lower extremity angiogram and possible revascularization this week  Toxic metabolic encephalopathy/uremia Resolved - due to above + uremia   Acute on chronic anemia - ?acute blood loss  - Hgb stable - continue to follow - Fe studies-mixed anemia, FOBT neg on 8/6 -  tansfused a unit of PRBCs on 10/29/12.   AKI on CKD  Baseline GFR ~45 as of July 2014 and July 2013 - back to baseline after fluid and blood infused, but now trending up, monitor closely   DM2 - uncontrolled  still poor BG control, Will increase 70/30 to 25units BID   Hx of HTN  stable  Seizure disorder Dilantin dosing being adjusted by Rx   DVT Prophylaxis:  Code Status:  Disposition:    Subjective: No specific complaints by the patient himself, worried about the ramp to be placed in the house  Objective: Weight change:   Intake/Output Summary (Last 24 hours) at 10/30/12 1316 Last data filed at 10/30/12 1129  Gross per 24 hour  Intake    916 ml  Output   1100 ml  Net   -184 ml   Blood pressure 149/64, pulse 84, temperature 98.6 F (37 C), temperature source Oral, resp. rate 16, height 5\' 11"  (1.803 m), weight 81 kg (178 lb 9.2 oz), SpO2 97.00%.  Physical Exam: General: Alert and awake, oriented x3, not in any acute distress. CVS: S1-S2 clear, no murmur rubs or  gallops Chest: clear to auscultation bilaterally, no wheezing, rales or rhonchi Abdomen: soft nontender, nondistended, normal bowel sounds  Extremities: right lower extremity dressing intact, left lower extremity BKA   Lab Results: Basic Metabolic Panel:  Recent Labs Lab 10/24/12 0500  10/29/12 0540 10/30/12 0400  NA 138  < > 134* 134*  K 4.9  < > 4.5 4.0  CL 109  < > 107 109  CO2 17*  < > 15* 17*  GLUCOSE 205*  < > 256* 218*  BUN 59*  < > 21 20  CREATININE 1.66*  < > 1.46* 1.58*  CALCIUM 9.1  < > 8.2* 8.3*  PHOS 3.0  --   --   --   < > = values in this interval not displayed. Liver Function Tests:  Recent Labs Lab 10/23/12 1925 10/24/12 0500  ALBUMIN 1.7* 1.9*   No results found for this basename: LIPASE, AMYLASE,  in the last 168 hours No results found for this basename: AMMONIA,  in the last 168 hours CBC:  Recent Labs Lab 10/29/12 0540 10/30/12 0400  WBC 17.3* 14.9*  HGB 8.0* 8.4*  HCT 23.5* 24.2*  MCV 84.8 82.6  PLT 357 311   Cardiac Enzymes: No results found for this basename: CKTOTAL, CKMB, CKMBINDEX, TROPONINI,  in the last 168 hours BNP: No components found with this basename: POCBNP,  CBG:  Recent Labs Lab 10/29/12 1115  10/29/12 1644 10/29/12 2156 10/30/12 0640 10/30/12 1147  GLUCAP 206* 201* 197* 194* 179*     Micro Results: Recent Results (from the past 240 hour(s))  CULTURE, BLOOD (ROUTINE X 2)     Status: None   Collection Time    10/23/12  4:15 AM      Result Value Range Status   Specimen Description BLOOD LEFT ARM   Final   Special Requests BOTTLES DRAWN AEROBIC AND ANAEROBIC 10CC EACH   Final   Culture  Setup Time     Final   Value: 10/23/2012 10:20     Performed at [facility]   Culture     Final   Value: NO GROWTH 5 DAYS     Performed at [facility]   Report Status 10/28/2012 FINAL   Final  CULTURE, BLOOD (ROUTINE X 2)     Status: None   Collection Time    10/23/12  4:15 AM      Result Value Range  Status   Specimen Description BLOOD LEFT HAND   Final   Special Requests BOTTLES DRAWN AEROBIC ONLY 10CC   Final   Culture  Setup Time     Final   Value: 10/23/2012 10:19     Performed at [facility]   Culture     Final   Value: NO GROWTH 5 DAYS     Performed at [facility]   Report Status 10/28/2012 FINAL   Final  MRSA PCR SCREENING     Status: None   Collection Time    10/23/12  5:41 AM      Result Value Range Status   MRSA by PCR NEGATIVE  NEGATIVE Final   Comment:            The GeneXpert MRSA Assay (FDA     approved for NASAL specimens     only), is one component of a     comprehensive MRSA colonization     surveillance program. It is not     intended to diagnose MRSA     infection nor to guide or     monitor treatment for     MRSA infections.  ANAEROBIC CULTURE     Status: None   Collection Time    10/28/12  5:23 PM      Result Value Range Status   Specimen Description ABSCESS FOOT RIGHT   Final   Special Requests NONE POF VANCOMYCIN CIPRO   Final   Gram Stain PENDING   Incomplete   Culture     Final   Value: NO ANAEROBES ISOLATED; CULTURE IN PROGRESS FOR 5 DAYS     Performed at [facility]   Report Status PENDING   Incomplete  CULTURE, ROUTINE-ABSCESS     Status: None   Collection Time    10/28/12  5:23 PM      Result Value Range Status   Specimen Description ABSCESS FOOT RIGHT   Final   Special Requests NONE POF VANCOMYCIN CIPRO   Final   Gram Stain     Final   Value: NO WBC SEEN     NO SQUAMOUS EPITHELIAL CELLS SEEN     NO ORGANISMS SEEN     Performed at [facility]   Culture     Final   Value: MODERATE STAPHYLOCOCCUS AUREUS     Note: RIFAMPIN AND GENTAMICIN SHOULD NOT BE USED AS SINGLE DRUGS FOR TREATMENT OF STAPH INFECTIONS.  Performed at [facility]   Report Status PENDING   Incomplete    Studies/Results: US Renal  10/23/2012   *RADIOLOGY REPORT*  Clinical Data: Acute renal insufficiency.  RENAL/URINARY  TRACT ULTRASOUND COMPLETE  Comparison:  None.  Findings:  Right Kidney:  Measures 11.7 cm.  No stone, mass or hydronephrosis. Mildly increased cortical echogenicity is noted.  Left Kidney:  Measures 12.2 cm.  No stone or hydronephrosis. A 0.5 cm hyperechoic focus with posterior shadowing compatible with a stone is noted.  Cortical echogenicity is somewhat increased.  Bladder:  Bilateral ureteral jets are visualized. The prostate gland appears prominent measuring 4.3 x 3.8 x 4.8 cm.  IMPRESSION:  1.  Negative for hydronephrosis or other acute abnormality. 2.  Increased cortical echogenicity of the kidneys compatible with medical renal disease. 3.  0.5 cm nonobstructing stone mid pole left kidney.   Original Report Authenticated By: Orlean Patten, M.D.    Medications: Scheduled Meds: . atorvastatin  20 mg Oral Daily  . ciprofloxacin  400 mg Intravenous Q12H  . insulin aspart  0-20 Units Subcutaneous TID WC  . insulin aspart  0-5 Units Subcutaneous QHS  . insulin aspart protamine- aspart  22 Units Subcutaneous BID WC  . metoprolol  25 mg Oral BID  . phenytoin  200 mg Oral BID  . polyethylene glycol  17 g Oral Daily  . senna-docusate  1 tablet Oral BID  . sodium chloride  3 mL Intravenous Q12H  . timolol  1 drop Both Eyes Daily  . Travoprost (BAK Free)  1 drop Both Eyes QHS  . vancomycin  750 mg Intravenous Q12H      LOS: 8 days   Dondi Burandt M.D. [facility] 10/30/2012, 1:16 PM Pager: [phone]  If 7PM-7AM, please contact night-coverage [email or web address] Password [email or web address]

## 2012-10-31 ENCOUNTER — Encounter (HOSPITAL_COMMUNITY)
Admission: EM | Disposition: A | Discharge status: Home Health | Payer: Self-pay | Source: Home / Self Care | Attending: Internal Medicine

## 2012-10-31 ENCOUNTER — Ambulatory Visit (HOSPITAL_COMMUNITY): Admission: RE | Admit: 2012-10-31 | Payer: Medicare Other | Source: Ambulatory Visit | Admitting: Surgery

## 2012-10-31 DIAGNOSIS — L97519 Non-pressure chronic ulcer of other part of right foot with unspecified severity: Secondary | ICD-10-CM | POA: Diagnosis present

## 2012-10-31 DIAGNOSIS — I739 Peripheral vascular disease, unspecified: Secondary | ICD-10-CM

## 2012-10-31 DIAGNOSIS — L98499 Non-pressure chronic ulcer of skin of other sites with unspecified severity: Secondary | ICD-10-CM

## 2012-10-31 DIAGNOSIS — N400 Enlarged prostate without lower urinary tract symptoms: Secondary | ICD-10-CM

## 2012-10-31 HISTORY — PX: LOWER EXTREMITY ANGIOGRAM: SHX5508

## 2012-10-31 LAB — CBC
HCT: 26 % — ABNORMAL LOW (ref 39.0–52.0)
Hemoglobin: 9.1 g/dL — ABNORMAL LOW (ref 13.0–17.0)
MCH: 28.9 pg (ref 26.0–34.0)
MCHC: 35 g/dL (ref 30.0–36.0)
MCV: 82.5 fL (ref 78.0–100.0)
RDW: 14 % (ref 11.5–15.5)

## 2012-10-31 LAB — GLUCOSE, CAPILLARY
Glucose-Capillary: 140 mg/dL — ABNORMAL HIGH (ref 70–99)
Glucose-Capillary: 142 mg/dL — ABNORMAL HIGH (ref 70–99)
Glucose-Capillary: 151 mg/dL — ABNORMAL HIGH (ref 70–99)
Glucose-Capillary: 181 mg/dL — ABNORMAL HIGH (ref 70–99)
Glucose-Capillary: 192 mg/dL — ABNORMAL HIGH (ref 70–99)

## 2012-10-31 LAB — CULTURE, ROUTINE-ABSCESS: Gram Stain: NONE SEEN

## 2012-10-31 LAB — POCT ACTIVATED CLOTTING TIME
Activated Clotting Time: 181 seconds
Activated Clotting Time: 196 s
Activated Clotting Time: 181 seconds

## 2012-10-31 LAB — BASIC METABOLIC PANEL
BUN: 21 mg/dL (ref 6–23)
CO2: 18 mEq/L — ABNORMAL LOW (ref 19–32)
Calcium: 8.6 mg/dL (ref 8.4–10.5)
Creatinine, Ser: 1.57 mg/dL — ABNORMAL HIGH (ref 0.50–1.35)
GFR calc non Af Amer: 45 mL/min — ABNORMAL LOW (ref >90)
Glucose, Bld: 129 mg/dL — ABNORMAL HIGH (ref 70–99)

## 2012-10-31 SURGERY — ANGIOGRAM, LOWER EXTREMITY
Anesthesia: LOCAL | Laterality: Right

## 2012-10-31 MED ORDER — LIDOCAINE HCL (PF) 1 % IJ SOLN
INTRAMUSCULAR | Status: AC
  Filled 2012-10-31: qty 30

## 2012-10-31 MED ORDER — SODIUM CHLORIDE 0.9 % IV SOLN: INTRAVENOUS | Status: DC

## 2012-10-31 MED ORDER — ACETAMINOPHEN 650 MG RE SUPP: 325.0000 mg | RECTAL | Status: DC | PRN

## 2012-10-31 MED ORDER — ACETAMINOPHEN 325 MG PO TABS: 325.0000 mg | ORAL_TABLET | ORAL | Status: DC | PRN

## 2012-10-31 MED ORDER — METOPROLOL TARTRATE 1 MG/ML IV SOLN: 2.0000 mg | INTRAVENOUS | Status: DC | PRN

## 2012-10-31 MED ORDER — ALUM & MAG HYDROXIDE-SIMETH 200-200-20 MG/5ML PO SUSP: 15.0000 mL | ORAL | Status: DC | PRN

## 2012-10-31 MED ORDER — ONDANSETRON HCL 4 MG/2ML IJ SOLN: 4.0000 mg | Freq: Four times a day (QID) | INTRAMUSCULAR | Status: DC | PRN

## 2012-10-31 MED ORDER — PHENOL 1.4 % MT LIQD
1.0000 | OROMUCOSAL | Status: DC | PRN
  Filled 2012-10-31: qty 177

## 2012-10-31 MED ORDER — HEPARIN SODIUM (PORCINE) 1000 UNIT/ML IJ SOLN
INTRAMUSCULAR | Status: AC
  Filled 2012-10-31: qty 1

## 2012-10-31 MED ORDER — FENTANYL CITRATE 0.05 MG/ML IJ SOLN
INTRAMUSCULAR | Status: AC
  Filled 2012-10-31: qty 2

## 2012-10-31 MED ORDER — TAMSULOSIN HCL 0.4 MG PO CAPS
0.4000 mg | ORAL_CAPSULE | Freq: Every day | ORAL | Status: DC
  Administered 2012-11-01: 0.4 mg via ORAL
  Filled 2012-10-31 (×2): qty 1

## 2012-10-31 MED ORDER — LABETALOL HCL 5 MG/ML IV SOLN: 10.0000 mg | INTRAVENOUS | Status: DC | PRN

## 2012-10-31 MED ORDER — HYDRALAZINE HCL 20 MG/ML IJ SOLN: 10.0000 mg | INTRAMUSCULAR | Status: DC | PRN

## 2012-10-31 MED ORDER — GUAIFENESIN-DM 100-10 MG/5ML PO SYRP: 15.0000 mL | ORAL_SOLUTION | ORAL | Status: DC | PRN

## 2012-10-31 MED ORDER — HEPARIN (PORCINE) IN NACL 2-0.9 UNIT/ML-% IJ SOLN
INTRAMUSCULAR | Status: AC
  Filled 2012-10-31: qty 1000

## 2012-10-31 MED ORDER — MIDAZOLAM HCL 2 MG/2ML IJ SOLN
INTRAMUSCULAR | Status: AC
  Filled 2012-10-31: qty 2

## 2012-10-31 NOTE — Interval H&P Note (Signed)
History and Physical Interval Note:  10/31/2012 9:28 AM  [name].  has presented today for surgery, with the diagnosis of PVD  The various methods of treatment have been discussed with the patient and family. After consideration of risks, benefits and other options for treatment, the patient has consented to  Procedure(s): LOWER EXTREMITY ANGIOGRAM (Right) as a surgical intervention .  The patient's history has been reviewed, patient examined, no change in status, stable for surgery.  I have reviewed the patient's chart and labs.  Questions were answered to the patient's satisfaction.     Miral Hoopes IV, V. WELLS

## 2012-10-31 NOTE — Progress Notes (Signed)
Patient ID: [name]  male  [id number]    DOB: 09/17/1945    DOA: 10/22/2012  PCP: [name], MD  Assessment/Plan:   SIRS v/s sepsis - possible R foot surgical wound infection: Unclear source of infection, but likely from R foot wound - UA negative - blood cultures remain negative , but WBC was elevated, trending down  - still with leukocytosis>> s/pr I&D per Dr [name] 8/11.  - Cultures showed MSSA, ORTHO recommends 6wks of IV abx,  PICC line placed  - Ankle-brachial indices dampened monophasic with ABI 0.59,  vascular surgery following - right lower extremity angiogram today, plan per vascular surgery  BPH: patient c/o urinary hesitancy and urgency, no hematuria or dysuria - Will start patient on Flomax, check UA and culture to rule out UTI - He has seen urologist in the past, could not remember the name - Recommended patient to see his urologist or [facility] after DC for f/u  Toxic metabolic encephalopathy/uremia Resolved - due to above + uremia   Acute on chronic anemia - ?acute blood loss  - Hgb stable - continue to follow - Fe studies-mixed anemia, FOBT neg on 8/6 -  tansfused a unit of PRBCs on 10/29/12.   AKI on CKD- stable and improving  Baseline GFR ~45 as of July 2014 and July 2013 - back to baseline after fluid and blood infused, monitor closely   DM2 - uncontrolled  still poor BG control, cont 70/30 at 25units BID   Hx of HTN  stable  Seizure disorder Dilantin dosing being adjusted by Rx   DVT Prophylaxis:  Code Status:  Disposition: once cleared by vascular surgery and Dr. [name]    Subjective: C/o urinary symptoms of hesitancy, dribbling  Objective: Weight change:   Intake/Output Summary (Last 24 hours) at 10/31/12 1904 Last data filed at 10/31/12 0500  Gross per 24 hour  Intake    200 ml  Output    100 ml  Net    100 ml   Blood pressure 158/62, pulse 93, temperature 98.4 F (36.9 C), temperature source Oral, resp. rate 16,  height 5\' 11"  (1.803 m), weight 81 kg (178 lb 9.2 oz), SpO2 96.00%.  Physical Exam: General: A x O x3, NAD CVS: S1-S2 clear, no murmur rubs or gallops Chest: CTAB Abdomen: soft NT, ND, NBS  Extremities: RLE dressing intact, left lower extremity BKA   Lab Results: Basic Metabolic Panel:  Recent Labs Lab 10/30/12 0400 10/31/12 0445  NA 134* 132*  K 4.0 4.2  CL 109 105  CO2 17* 18*  GLUCOSE 218* 129*  BUN 20 21  CREATININE 1.58* 1.57*  CALCIUM 8.3* 8.6   Liver Function Tests:  Recent Labs Lab 10/31/12 0445  ALBUMIN 1.6*   No results found for this basename: LIPASE, AMYLASE,  in the last 168 hours No results found for this basename: AMMONIA,  in the last 168 hours CBC:  Recent Labs Lab 10/30/12 0400 10/31/12 0445  WBC 14.9* 14.4*  HGB 8.4* 9.1*  HCT 24.2* 26.0*  MCV 82.6 82.5  PLT 311 342   Cardiac Enzymes: No results found for this basename: CKTOTAL, CKMB, CKMBINDEX, TROPONINI,  in the last 168 hours BNP: No components found with this basename: POCBNP,  CBG:  Recent Labs Lab 10/30/12 2143 10/31/12 0651 10/31/12 1053 10/31/12 1406 10/31/12 1716  GLUCAP 180* 140* 142* 151* 181*     Micro Results: Recent Results (from the past 240 hour(s))  CULTURE, BLOOD (ROUTINE X  2)     Status: None   Collection Time    10/23/12  4:15 AM      Result Value Range Status   Specimen Description BLOOD LEFT ARM   Final   Special Requests BOTTLES DRAWN AEROBIC AND ANAEROBIC 10CC EACH   Final   Culture  Setup Time     Final   Value: 10/23/2012 10:20     Performed at [facility]   Culture     Final   Value: NO GROWTH 5 DAYS     Performed at [facility]   Report Status 10/28/2012 FINAL   Final  CULTURE, BLOOD (ROUTINE X 2)     Status: None   Collection Time    10/23/12  4:15 AM      Result Value Range Status   Specimen Description BLOOD LEFT HAND   Final   Special Requests BOTTLES DRAWN AEROBIC ONLY 10CC   Final   Culture  Setup Time     Final    Value: 10/23/2012 10:19     Performed at [facility]   Culture     Final   Value: NO GROWTH 5 DAYS     Performed at [facility]   Report Status 10/28/2012 FINAL   Final  MRSA PCR SCREENING     Status: None   Collection Time    10/23/12  5:41 AM      Result Value Range Status   MRSA by PCR NEGATIVE  NEGATIVE Final   Comment:            The GeneXpert MRSA Assay (FDA     approved for NASAL specimens     only), is one component of a     comprehensive MRSA colonization     surveillance program. It is not     intended to diagnose MRSA     infection nor to guide or     monitor treatment for     MRSA infections.  ANAEROBIC CULTURE     Status: None   Collection Time    10/28/12  5:23 PM      Result Value Range Status   Specimen Description ABSCESS FOOT RIGHT   Final   Special Requests NONE POF VANCOMYCIN CIPRO   Final   Gram Stain PENDING   Incomplete   Culture     Final   Value: NO ANAEROBES ISOLATED; CULTURE IN PROGRESS FOR 5 DAYS     Performed at [facility]   Report Status PENDING   Incomplete  CULTURE, ROUTINE-ABSCESS     Status: None   Collection Time    10/28/12  5:23 PM      Result Value Range Status   Specimen Description ABSCESS FOOT RIGHT   Final   Special Requests NONE POF VANCOMYCIN CIPRO   Final   Gram Stain     Final   Value: NO WBC SEEN     NO SQUAMOUS EPITHELIAL CELLS SEEN     NO ORGANISMS SEEN     Performed at [facility]   Culture     Final   Value: MODERATE STAPHYLOCOCCUS AUREUS     Note: RIFAMPIN AND GENTAMICIN SHOULD NOT BE USED AS SINGLE DRUGS FOR TREATMENT OF STAPH INFECTIONS.     Performed at [facility]   Report Status 10/31/2012 FINAL   Final   Organism ID, Bacteria STAPHYLOCOCCUS AUREUS   Final    Studies/Results: US Renal  10/23/2012   *  RADIOLOGY REPORT*  Clinical Data: Acute renal insufficiency.  RENAL/URINARY TRACT ULTRASOUND COMPLETE  Comparison:  None.  Findings:  Right Kidney:  Measures 11.7  cm.  No stone, mass or hydronephrosis. Mildly increased cortical echogenicity is noted.  Left Kidney:  Measures 12.2 cm.  No stone or hydronephrosis. A 0.5 cm hyperechoic focus with posterior shadowing compatible with a stone is noted.  Cortical echogenicity is somewhat increased.  Bladder:  Bilateral ureteral jets are visualized. The prostate gland appears prominent measuring 4.3 x 3.8 x 4.8 cm.  IMPRESSION:  1.  Negative for hydronephrosis or other acute abnormality. 2.  Increased cortical echogenicity of the kidneys compatible with medical renal disease. 3.  0.5 cm nonobstructing stone mid pole left kidney.   Original Report Authenticated By: Orlean Patten, M.D.    Medications: Scheduled Meds: . atorvastatin  20 mg Oral Daily  . ciprofloxacin  400 mg Intravenous Q12H  . insulin aspart  0-20 Units Subcutaneous TID WC  . insulin aspart  0-5 Units Subcutaneous QHS  . insulin aspart protamine- aspart  25 Units Subcutaneous BID WC  . metoprolol  25 mg Oral BID  . phenytoin  200 mg Oral BID  . polyethylene glycol  17 g Oral Daily  . senna-docusate  1 tablet Oral BID  . sodium chloride  3 mL Intravenous Q12H  . tamsulosin  0.4 mg Oral QPC supper  . timolol  1 drop Both Eyes Daily  . Travoprost (BAK Free)  1 drop Both Eyes QHS  . vancomycin  750 mg Intravenous Q12H      LOS: 9 days   Trever Streater M.D. [facility] 10/31/2012, 7:04 PM Pager: [phone]  If 7PM-7AM, please contact night-coverage [email or web address] Password [email or web address]

## 2012-10-31 NOTE — Op Note (Signed)
[facility]  Patient name: [name]. MRN: [id number] DOB: [birth year] Sex: male  10/22/2012 - 10/31/2012 Pre-operative Diagnosis: Right leg ulcer Post-operative diagnosis:  Same Surgeon:  Eldridge Abrahams Procedure Performed:  1.  ultrasound-guided access, left femoral artery  2.  abdominal aortogram  3.  right lower extremity runoff  4.  additional order catheterization (anterior tibial artery)  5.  failed attempt at angioplasty, right anterior tibial artery    Indications:  The patient is previously undergone left leg amputation. He has had to undergo toe amputation on the right. He comes in today for further evaluation of his blood flow  Procedure:  The patient was identified in the holding area and taken to room 8.  The patient was then placed supine on the table and prepped and draped in the usual sterile fashion.  A time out was called.  Ultrasound was used to evaluate the left common femoral artery.  It was patent .  A digital ultrasound image was acquired.  A micropuncture needle was used to access the left common femoral artery under ultrasound guidance.  An 018 wire was advanced without resistance and a micropuncture sheath was placed.  The 018 wire was removed and a benson wire was placed.  The micropuncture sheath was exchanged for a 5 french sheath.  An omniflush catheter was advanced over the wire to the level of L-1.  An abdominal angiogram was obtained.  Next, using the omniflush catheter and a benson wire, the aortic bifurcation was crossed and the catheter was placed into theright external iliac artery and right runoff was obtained.    Findings:   Aortogram:  The visualized portions of the suprarenal abdominal aorta showed no significant stenosis. Single renal arteries are identified which are widely patent. The infrarenal abdominal aorta is widely patent. Bilateral common, external, and internal iliac arteries are widely patent.  Right  Lower Extremity:  The right common femoral, superficial femoral, profundofemoral, and popliteal artery are widely patent. The anterior tibial artery occludes just beyond its origin. The posterior tibial artery is occluded. The peroneal artery is the dominant runoff vessel. At the ankle collaterals reconstitute a dorsalis pedis.  Intervention:  After the above images were acquired, I elected to proceed with an attempt at intervention given the patient's limb threatening status. Over a Rosen wire a 6 French sheath was advanced into the right superficial femoral artery. The patient was fully heparinized. I used a 014 miracle brothers wire to get access into the popliteal artery. A Berenstein 2 catheter was used to select the origin of the right anterior tibial artery. I then advanced a Fox SV 3 x 1 20 balloon over the wire. I attempted to perform a subintimal recanalization of the occluded anterior tibial artery, however the plaque was very calcified and I could not advance the balloon catheter or wire very far into the anterior tibial artery because he continue to reflux back into the peroneal. I did not feel that this was possible to treat and a and T. grade fashion percutaneously. Therefore, the procedure was terminated. The catheters and wires were performed. The patient was taken back to the holding area for sheath pull once the coagulation profile has corrected  Impression:  #1  no significant aortoiliac occlusive disease  #2  widely patent right superficial femoral and popliteal artery  #3  single vessel runoff via the peroneal artery which collateralizes at the ankle. A dorsalis pedis is reconstituted, however the  plantar arch does not appear to be intact. Minimal contrast opacification of the digital arteries was visualized  #4  failed attempt at the subintimal recanalization of the anterior tibial artery   V. Annamarie Major, M.D. [facility] Office:  [phone] Pager:  [phone]

## 2012-10-31 NOTE — Progress Notes (Signed)
MEDICATION RELATED CONSULT NOTE - FOLLOW UP   Pharmacy Consult for phenytoin Indication: seizure  Allergies  Allergen Reactions  . Codeine Anaphylaxis  . Penicillins Anaphylaxis    Patient Measurements: Height: 5\' 11"  (180.3 cm) Weight: 178 lb 9.2 oz (81 kg) IBW/kg (Calculated) : 75.3   Vital Signs: Pulse Rate: 93 (08/14 0938) Intake/Output from previous day: 08/13 0701 - 08/14 0700 In: 311 [P.O.:111; I.V.:200] Out: 350 [Urine:350] Intake/Output from this shift:    Labs:  Recent Labs  10/29/12 0540 10/30/12 0400 10/31/12 0445  WBC 17.3* 14.9* 14.4*  HGB 8.0* 8.4* 9.1*  HCT 23.5* 24.2* 26.0*  PLT 357 311 342  CREATININE 1.46* 1.58* 1.57*  ALBUMIN  --   --  1.6*   Estimated Creatinine Clearance: 50 ml/min (by C-G formula based on Cr of 1.57).   Microbiology: Recent Results (from the past 720 hour(s))  CULTURE, BLOOD (ROUTINE X 2)     Status: None   Collection Time    10/23/12  4:15 AM      Result Value Range Status   Specimen Description BLOOD LEFT ARM   Final   Special Requests BOTTLES DRAWN AEROBIC AND ANAEROBIC 10CC EACH   Final   Culture  Setup Time     Final   Value: 10/23/2012 10:20     Performed at [facility]   Culture     Final   Value: NO GROWTH 5 DAYS     Performed at [facility]   Report Status 10/28/2012 FINAL   Final  CULTURE, BLOOD (ROUTINE X 2)     Status: None   Collection Time    10/23/12  4:15 AM      Result Value Range Status   Specimen Description BLOOD LEFT HAND   Final   Special Requests BOTTLES DRAWN AEROBIC ONLY 10CC   Final   Culture  Setup Time     Final   Value: 10/23/2012 10:19     Performed at [facility]   Culture     Final   Value: NO GROWTH 5 DAYS     Performed at [facility]   Report Status 10/28/2012 FINAL   Final  MRSA PCR SCREENING     Status: None   Collection Time    10/23/12  5:41 AM      Result Value Range Status   MRSA by PCR NEGATIVE  NEGATIVE Final   Comment:             The GeneXpert MRSA Assay (FDA     approved for NASAL specimens     only), is one component of a     comprehensive MRSA colonization     surveillance program. It is not     intended to diagnose MRSA     infection nor to guide or     monitor treatment for     MRSA infections.  ANAEROBIC CULTURE     Status: None   Collection Time    10/28/12  5:23 PM      Result Value Range Status   Specimen Description ABSCESS FOOT RIGHT   Final   Special Requests NONE POF VANCOMYCIN CIPRO   Final   Gram Stain PENDING   Incomplete   Culture     Final   Value: NO ANAEROBES ISOLATED; CULTURE IN PROGRESS FOR 5 DAYS     Performed at [facility]   Report Status PENDING   Incomplete  CULTURE, ROUTINE-ABSCESS  Status: None   Collection Time    10/28/12  5:23 PM      Result Value Range Status   Specimen Description ABSCESS FOOT RIGHT   Final   Special Requests NONE POF VANCOMYCIN CIPRO   Final   Gram Stain     Final   Value: NO WBC SEEN     NO SQUAMOUS EPITHELIAL CELLS SEEN     NO ORGANISMS SEEN     Performed at [facility]   Culture     Final   Value: MODERATE STAPHYLOCOCCUS AUREUS     Note: RIFAMPIN AND GENTAMICIN SHOULD NOT BE USED AS SINGLE DRUGS FOR TREATMENT OF STAPH INFECTIONS.     Performed at [facility]   Report Status 10/31/2012 FINAL   Final   Organism ID, Bacteria STAPHYLOCOCCUS AUREUS   Final     Assessment: Patient on phenytoin 300mg  qhs PTA for history of seizure, his level was undetectable on admission and he was reloaded with 1000mg  IV x1 on 8/6 and maintenance regimen was increased to 200mg  PO BID. The first phenytoin level was drawn on 8/6 appropriately after the loading dose and was 5.8, with an albumin of 1.7 this corrected to a therapeutic level of 13mg /L. Another level was checked this morning to ensure the maintenance regimen was adequate- the total phenytoin level was 5.8mg /L with an albumin of 1.6, making the corrected phenytoin level  13.8mg /L. Patient has not had any seizures during admission.  Goal of Therapy:  Phenytoin level 10-20mg /L  Plan:  1. Continue phenytoin 200mg  PO BID 2. No more phenytoin levels are needed during this admission. Patient should follow as an outpatient to ensure dosing regimen is appropriate at his neurologist's discretion  Jaxtyn Linville D. Trinten Boudoin, PharmD Clinical Pharmacist Pager: [phone] 10/31/2012 10:51 AM

## 2012-10-31 NOTE — H&P (View-Only) (Signed)
Vascular Surgery Consultation  Reason for Consult: Diabetes mellitus with infected right foot  HPI: Devin Schrag. is a 67 y.o. male who presents for evaluation of her infected right foot. This patient with insulin-dependent diabetes mellitus has had previous left below-knee" 2-3 years ago by Dr. [name]. He has also had second third and fourth toes amputated from the right foot. He presented with a foot infection was treated with debridement by Dr. [name]. He has been ambulating well with a below-knee prosthesis on the left the past few years.   Past Medical History  Diagnosis Date  . Diabetes mellitus   . Hypertension   . Seizures   . Stroke   . Peripheral vascular disease   . CKD (chronic kidney disease)     Stage II (per 06/2010 notes)  . Neuromuscular disorder    Past Surgical History  Procedure Laterality Date  . Colon surgery      partial colectomy  . Eye surgery      bilat cataract surgery  . Below knee leg amputation      L  . Toe amputation      R two  . Amputation  10/10/2011    Procedure: AMPUTATION DIGIT;  Surgeon: Newt Minion, MD;  Location: [place];  Service: Orthopedics;  Laterality: Right;  Right Foot 4th Toe Amputation  . Orif toe fracture Right 10/09/2012    Procedure: OPEN REDUCTION INTERNAL FIXATION (ORIF) METATARSAL (TOE) FRACTURE;  Surgeon: Newt Minion, MD;  Location: [place];  Service: Orthopedics;  Laterality: Right;  Right Foot Base 1st Metatarsal and Medial Cuneoform Excision, Internal Fixation, Antibiotic Beads  . I&d extremity Right 10/28/2012    Procedure: IRRIGATION AND DEBRIDEMENT EXTREMITY;  Surgeon: Newt Minion, MD;  Location: [place];  Service: Orthopedics;  Laterality: Right;  Irrigation and Debridement Right Foot, Remove Deep Hardware, Place Antibiotic Beads   History   Social History  . Marital Status: Married    Spouse Name: N/A    Number of Children: N/A  . Years of Education: N/A   Social History Main Topics  . Smoking status: Never  Smoker   . Smokeless tobacco: Never Used  . Alcohol Use: 0.6 oz/week    1 Glasses of wine per week     Comment: seldom  . Drug Use: No  . Sexual Activity: None   Other Topics Concern  . None   Social History Narrative  . None   No family history on file. Allergies  Allergen Reactions  . Codeine Anaphylaxis  . Penicillins Anaphylaxis   Prior to Admission medications   Medication Sig Start Date End Date Taking? Authorizing Provider  aspirin 325 MG tablet Take 325 mg by mouth daily.   Yes Historical Provider, MD  atorvastatin (LIPITOR) 20 MG tablet Take 20 mg by mouth daily.   Yes Historical Provider, MD  insulin aspart protamine-insulin aspart (NOVOLOG 70/30) (70-30) 100 UNIT/ML injection Inject 15 Units into the skin 2 (two) times daily with a meal.    Yes Historical Provider, MD  metoprolol (LOPRESSOR) 50 MG tablet Take 25 mg by mouth 2 (two) times daily.   Yes Historical Provider, MD  olmesartan-hydrochlorothiazide (BENICAR HCT) 20-12.5 MG per tablet Take 1 tablet by mouth every morning.    Yes Historical Provider, MD  phenytoin (DILANTIN) 100 MG ER capsule Take 300 mg by mouth at bedtime.   Yes Historical Provider, MD  timolol (BETIMOL) 0.5 % ophthalmic solution Place 1 drop into both eyes every  morning.    Yes Historical Provider, MD  torsemide (DEMADEX) 20 MG tablet Take 10 mg by mouth 2 (two) times daily.    Yes Historical Provider, MD  traMADol (ULTRAM) 50 MG tablet Take 1 tablet (50 mg total) by mouth every 6 (six) hours as needed for pain. Maximum dose= 8 tablets per day 10/10/12  Yes Newt Minion, MD  Travoprost, BAK Free, ([name]) 0.004 % SOLN ophthalmic solution Place 1 drop into both eyes at bedtime.   Yes Historical Provider, MD     Positive ROS: Denies chest pain, dyspnea on exertion, PND, orthopnea. Does have a history of stroke he states but has no deficits. Denies lateralizing weakness, aphasia, amaurosis fugax, diplopia, blurred vision, syncope. All other  systems negative and complete review of systems  All other systems have been reviewed and were otherwise negative with the exception of those mentioned in the HPI and as above.  Physical Exam: Filed Vitals:   10/30/12 0547  BP: 149/64  Pulse: 84  Temp: 98.6 F (37 C)  Resp: 16    General: Alert, no acute distress HEENT: Normal for age Cardiovascular: Regular rate and rhythm. Carotid pulses 2+, no bruits audible Respiratory: Clear to auscultation. No cyanosis, no use of accessory musculature GI: No organomegaly, abdomen is soft and non-tender Skin: No lesions in the area of chief complaint Neurologic: Sensation intact distally Psychiatric: Patient is competent for consent with normal mood and affect   Musculoskeletal: No obvious deformities Extremities: Left leg with 3+ femoral and popliteal pulse palpable and below-knee amputation prosthesis Right leg with 3+ femoral and popliteal pulse palpable surgical dressing right foot in place. No evidence of proximal calf infection or cellulitis.  Labs reviewed: ABI right lower extremity 0.59    Assessment/Plan:  This patient with type 1 diabetes mellitus has tibial occlusive disease and right lower extremity and also likely this led to his left below-knee amputation in the past. He has required 3 toes amputated in the right foot and now has infection. It is doubtful that he will have any disease process  Amenable to percutaneous intervention or bypass. Will however obtain right lower extremity angiogram in case he does have a situation which could be improved to increase likelihood of limb salvage we'll try to schedule this for Thursday but if not will obtain this on Friday. Discussed with patient and he is agreeable  Tinnie Gens, MD 10/30/2012 1:06 PM

## 2012-10-31 NOTE — Progress Notes (Signed)
Patient ID: [name]., male   DOB: Aug 14, 1945, 67 y.o.   MRN: [id number] Patient in good spirits this morning. Vascular surgery to perform arteriogram either today or tomorrow. I appreciate Dr. Evelena Leyden input.

## 2012-10-31 NOTE — Progress Notes (Signed)
OT Cancellation Note  Patient Details Name: [name]. MRN: [id number] DOB: 06-Jun-1945   Cancelled Treatment:    Reason Eval/Treat Not Completed: Patient at procedure or test/ unavailable. Pt down for angiogram and then will be on bedrest.  Almon Register [id number] 10/31/2012, 11:07 AM

## 2012-10-31 NOTE — Progress Notes (Signed)
PT Cancellation Note  Patient Details Name: [name]. MRN: [id number] DOB: 04-Mar-1946   Cancelled Treatment:    Reason Eval/Treat Not Completed: Patient at procedure or test/unavailable   Ladona Ridgel 10/31/2012, 10:31 AM Gerlean Ren PT [facility] [phone] [place] [phone]

## 2012-11-01 DIAGNOSIS — L97509 Non-pressure chronic ulcer of other part of unspecified foot with unspecified severity: Secondary | ICD-10-CM

## 2012-11-01 LAB — BASIC METABOLIC PANEL
CO2: 18 mEq/L — ABNORMAL LOW (ref 19–32)
Chloride: 108 mEq/L (ref 96–112)
Creatinine, Ser: 1.4 mg/dL — ABNORMAL HIGH (ref 0.50–1.35)
GFR calc Af Amer: 59 mL/min — ABNORMAL LOW (ref >90)
Sodium: 136 mEq/L (ref 135–145)

## 2012-11-01 LAB — GLUCOSE, CAPILLARY: Glucose-Capillary: 141 mg/dL — ABNORMAL HIGH (ref 70–99)

## 2012-11-01 LAB — CBC
MCH: 28.3 pg (ref 26.0–34.0)
MCV: 83 fL (ref 78.0–100.0)
Platelets: 337 10*3/uL (ref 150–400)
RDW: 14.4 % (ref 11.5–15.5)
WBC: 14.5 10*3/uL — ABNORMAL HIGH (ref 4.0–10.5)

## 2012-11-01 LAB — ANAEROBIC CULTURE

## 2012-11-01 MED ORDER — HEPARIN SOD (PORK) LOCK FLUSH 100 UNIT/ML IV SOLN: 250.0000 [IU] | Freq: Every day | INTRAVENOUS | Status: DC

## 2012-11-01 MED ORDER — SILVER SULFADIAZINE 1 % EX CREA: TOPICAL_CREAM | CUTANEOUS | Status: DC

## 2012-11-01 MED ORDER — GUAIFENESIN-DM 100-10 MG/5ML PO SYRP: 15.0000 mL | ORAL_SOLUTION | ORAL | Status: DC | PRN

## 2012-11-01 MED ORDER — CIPROFLOXACIN HCL 500 MG PO TABS: 500.0000 mg | ORAL_TABLET | Freq: Two times a day (BID) | ORAL | Status: DC

## 2012-11-01 MED ORDER — INSULIN ASPART PROT & ASPART (70-30 MIX) 100 UNIT/ML ~~LOC~~ SUSP: 25.0000 [IU] | Freq: Two times a day (BID) | SUBCUTANEOUS | Status: DC

## 2012-11-01 MED ORDER — SODIUM CHLORIDE 0.9 % IJ SOLN: 10.0000 mL | INTRAMUSCULAR | Status: DC | PRN

## 2012-11-01 MED ORDER — TAMSULOSIN HCL 0.4 MG PO CAPS: 0.4000 mg | ORAL_CAPSULE | Freq: Every day | ORAL | Status: AC

## 2012-11-01 MED ORDER — SILVER SULFADIAZINE 1 % EX CREA
TOPICAL_CREAM | CUTANEOUS | Status: DC
  Filled 2012-11-01: qty 85

## 2012-11-01 MED ORDER — SENNOSIDES-DOCUSATE SODIUM 8.6-50 MG PO TABS: 1.0000 | ORAL_TABLET | Freq: Two times a day (BID) | ORAL | Status: DC

## 2012-11-01 MED ORDER — HEPARIN SOD (PORK) LOCK FLUSH 100 UNIT/ML IV SOLN
250.0000 [IU] | INTRAVENOUS | Status: DC | PRN
  Administered 2012-11-01: 250 [IU]

## 2012-11-01 MED ORDER — VANCOMYCIN HCL IN DEXTROSE 750-5 MG/150ML-% IV SOLN: 750.0000 mg | Freq: Two times a day (BID) | INTRAVENOUS | Status: DC

## 2012-11-01 MED ORDER — PHENYTOIN SODIUM EXTENDED 200 MG PO CAPS: 200.0000 mg | ORAL_CAPSULE | Freq: Two times a day (BID) | ORAL | Status: DC

## 2012-11-01 MED ORDER — POLYETHYLENE GLYCOL 3350 17 G PO PACK: 17.0000 g | PACK | Freq: Every day | ORAL | Status: DC | PRN

## 2012-11-01 NOTE — Progress Notes (Signed)
Occupational Therapy Treatment Patient Details Name: [name]. MRN: [id number] DOB: 1945/06/15 Today's Date: 11/01/2012 Time: [id number] OT Time Calculation (min): 16 min  OT Assessment / Plan / Recommendation  History of present illness Pt admitted with severe sepsis with acute organ dysfunction s/p deep hardware removal and debridement of abscess of bony excision on R foot.   OT comments  Discussed home safety and appropriate DME given difficulty with navigating through house @ w/c level. REc HHOT to follow u and assess further.   Follow Up Recommendations  Home health OT;Supervision/Assistance - 24 hour    Barriers to Discharge       Equipment Recommendations  Other (comment) (transport w/c or rollator. wife will look into this equipmen)    Recommendations for Other Services    Frequency Min 2X/week   Progress towards OT Goals    Plan Discharge plan remains appropriate    Precautions / Restrictions Precautions Precautions: Fall Required Braces or Orthoses: Other Brace/Splint Other Brace/Splint: L Prosthetic LE.   Restrictions RLE Weight Bearing: Non weight bearing LLE Weight Bearing: Weight bearing as tolerated   Pertinent Vitals/Pain no apparent distress     ADL  ADL Comments: focus of session discussing D/C plans and sfaest set up for pt at home. Wife staed she has difficulty getting wc through bedroom door.Discussed different options, including use of rolling office chair, use of rollator or transport w/c.     OT Diagnosis:    OT Problem List:   OT Treatment Interventions:     OT Goals(current goals can now be found in the care plan section) Acute Rehab OT Goals Patient Stated Goal: Pt would like to return home. OT Goal Formulation: With patient Time For Goal Achievement: 11/05/12 Potential to Achieve Goals: Good ADL Goals Pt Will Perform Lower Body Bathing: sit to/from stand;with min assist Pt Will Perform Lower Body Dressing: with min assist;sit  to/from stand Pt Will Transfer to Toilet: with modified independence Pt Will Perform Toileting - Clothing Manipulation and hygiene: with min guard assist;sit to/from stand;sitting/lateral leans Pt Will Perform Tub/Shower Transfer: Shower transfer;with min assist;ambulating;shower seat;rolling walker Additional ADL Goal #1: Pt will perform UE exercises to increase strength.   Visit Information  Last OT Received On: 11/01/12 Assistance Needed: +2 History of Present Illness: Pt admitted with severe sepsis with acute organ dysfunction s/p deep hardware removal and debridement of abscess of bony excision on R foot.    Subjective Data      Prior Functioning       Cognition  Cognition Arousal/Alertness: Awake/alert Behavior During Therapy: Flat affect (?depressed) Overall Cognitive Status: Within Functional Limits for tasks assessed    Mobility       Exercises  Other Exercises Other Exercises: BUE theraband HEP   Balance     End of Session OT - End of Session Activity Tolerance: Patient tolerated treatment well Patient left: in chair;with call bell/phone within reach;with family/visitor present Nurse Communication: Mobility status;Other (comment) (need for Exline)  [place] 11/01/2012, 5:14 PM [facility], OTR/L  [phone] 11/01/2012

## 2012-11-01 NOTE — Progress Notes (Signed)
Physical Therapy Treatment Patient Details Name: [name]. MRN: [id number] DOB: 1945/09/30 Today's Date: 11/01/2012 Time: [id number] PT Time Calculation (min): 33 min  PT Assessment / Plan / Recommendation  History of Present Illness Pt admitted with severe sepsis with acute organ dysfunction s/p deep hardware removal and debridement of abscess of bony excision on R foot.   PT Comments   Pt motivated to try to ambulate today.  Pt nervous about what the plan will be for his R foot.    Follow Up Recommendations  Home health PT;Supervision/Assistance - 24 hour     Does the patient have the potential to tolerate intense rehabilitation     Barriers to Discharge        Equipment Recommendations  None recommended by PT    Recommendations for Other Services    Frequency Min 3X/week   Progress towards PT Goals Progress towards PT goals: Progressing toward goals  Plan Frequency needs to be updated    Precautions / Restrictions Precautions Precautions: Fall Required Braces or Orthoses: Other Brace/Splint Other Brace/Splint: L Prosthetic LE.   Restrictions Weight Bearing Restrictions: Yes RLE Weight Bearing: Non weight bearing LLE Weight Bearing: Weight bearing as tolerated   Pertinent Vitals/Pain Indicates minimal pain.      Mobility  Bed Mobility Bed Mobility: Supine to Sit;Sitting - Scoot to Edge of Bed Supine to Sit: 6: Modified independent (Device/Increase time);With rails;HOB elevated Sitting - Scoot to Edge of Bed: 6: Modified independent (Device/Increase time) Details for Bed Mobility Assistance: Needs increased time, but able to complete without A.   Transfers Transfers: Sit to Stand;Stand to Sit Sit to Stand: 1: +2 Total assist;From bed;From elevated surface;From chair/3-in-1 Sit to Stand: Patient Percentage: 60% Stand to Sit: 4: Min assist;With upper extremity assist;To chair/3-in-1;With armrests Details for Transfer Assistance: When pt stands from elevated  bed, pt able to complete with only one person A, however from recliner with seat built up requires 2 person A.  When bed is elevated pt is able to don his prostheticin sitting.   Ambulation/Gait Ambulation/Gait Assistance: 4: Min assist Ambulation Distance (Feet): 8 Feet (x2) Assistive device: Rolling walker Ambulation/Gait Assistance Details: pt able to "hop" with L prosthetic LE and does good maintaining NWBing on R LE.   Gait Pattern: Step-to pattern Stairs: No Wheelchair Mobility Wheelchair Mobility: No    Exercises     PT Diagnosis:    PT Problem List:   PT Treatment Interventions:     PT Goals (current goals can now be found in the care plan section) Acute Rehab PT Goals Time For Goal Achievement: 11/05/12 Potential to Achieve Goals: Good  Visit Information  Last PT Received On: 11/01/12 Assistance Needed: +2 (helpful for sit to stand unless bed elevated.  ) History of Present Illness: Pt admitted with severe sepsis with acute organ dysfunction s/p deep hardware removal and debridement of abscess of bony excision on R foot.    Subjective Data      Cognition  Cognition Arousal/Alertness: Awake/alert Behavior During Therapy: WFL for tasks assessed/performed Overall Cognitive Status: Within Functional Limits for tasks assessed    Balance  Balance Balance Assessed: Yes Static Standing Balance Static Standing - Balance Support: Bilateral upper extremity supported;During functional activity Static Standing - Level of Assistance: 4: Min assist  End of Session PT - End of Session Equipment Utilized During Treatment: Gait belt Activity Tolerance: Patient tolerated treatment well Patient left: in chair;with call bell/phone within reach Nurse Communication: Mobility status  GP     Thai Hemrick, Thornton Papas, [place] 11/01/2012, 9:46 AM

## 2012-11-01 NOTE — Progress Notes (Signed)
Patient ID: Devin Rakers., male   DOB: Apr 06, 1945, 67 y.o.   MRN: [id number] Vascular Surgery Progress Note  Subjective: One day post angiography of right lower extremity to evaluate for possible revascularization. Patient had debridement and drainage of infection of right foot by Dr. [name]  Objective:  Filed Vitals:   11/01/12 0636  BP: 124/67  Pulse: 91  Temp: 98.2 F (36.8 C)  Resp: 20    Angiograms were reviewed and patient has widely patent common femoral and superficial femoral arteries and widely patent popliteal artery. There is one-vessel runoff through the peroneal artery which becomes diseased distally but is patent. It communicates with the anterior tibial and dorsalis pedis in the ankle and foot which is also diseased.   Labs:  Recent Labs Lab 10/30/12 0400 10/31/12 0445 11/01/12 0500  CREATININE 1.58* 1.57* 1.40*    Recent Labs Lab 10/30/12 0400 10/31/12 0445 11/01/12 0500  NA 134* 132* 136  K 4.0 4.2 4.2  CL 109 105 108  CO2 17* 18* 18*  BUN 20 21 18   CREATININE 1.58* 1.57* 1.40*  GLUCOSE 218* 129* 187*  CALCIUM 8.3* 8.6 8.6    Recent Labs Lab 10/30/12 0400 10/31/12 0445 11/01/12 0500  WBC 14.9* 14.4* 14.5*  HGB 8.4* 9.1* 8.8*  HCT 24.2* 26.0* 25.8*  PLT 311 342 337    Recent Labs Lab 10/31/12 0445  INR 1.21    I/O last 3 completed shifts: In: 200 [I.V.:200] Out: 550 [Urine:550]  Imaging: No results found.  Assessment/Plan:   LOS: 10 days  s/p Procedure(s): LOWER EXTREMITY ANGIOGRAM  I do not think there is any role for attempted revascularization in this patient who is one-vessel runoff to the ankle with diseased and severely to involve tibial vessels with total occlusion of anterior tibial and posterior tibial arteries proximally. Perineal artery is open throughout although the disease.  Recommend for Dr. [name] at 2 continue to follow foot wound and proceed as necessary with further amputation   Tinnie Gens,  MD 11/01/2012 1:27 PM

## 2012-11-01 NOTE — Discharge Summary (Signed)
Physician Discharge Summary  Patient ID: [name]. MRN: [id number] DOB/AGE: 65-Apr-1947 65 y.o.  Admit date: 10/22/2012 Discharge date: 11/01/2012  Primary Care Physician:  [name], MD  Discharge Diagnoses:    . Severe sepsis with acute organ dysfunction . AKI (acute kidney injury) . Altered mental status . DM2 (diabetes mellitus, type 2) . BPH (benign prostatic hyperplasia) . Right foot ulcer Peripheral vascular disease  Consults:  Orthopedics, Dr. [name] Vascular surgery,- Dr. Kellie Simmering   Recommendations for Outpatient Follow-up:  1. please check CBC, BMET, albumin, Dilantin level at the time of followup 2. please refer him to urology outpatient as patient had complained about BPH.  Allergies:   Allergies  Allergen Reactions  . Codeine Anaphylaxis  . Penicillins Anaphylaxis     Discharge Medications:   Medication List    STOP taking these medications       olmesartan-hydrochlorothiazide 20-12.5 MG per tablet  Commonly known as:  BENICAR HCT     torsemide 20 MG tablet  Commonly known as:  DEMADEX      TAKE these medications       aspirin 325 MG tablet  Take 325 mg by mouth daily.     atorvastatin 20 MG tablet  Commonly known as:  LIPITOR  Take 20 mg by mouth daily.     ciprofloxacin 500 MG tablet  Commonly known as:  CIPRO  Take 1 tablet (500 mg total) by mouth 2 (two) times daily. X 4 weeks     guaiFENesin-dextromethorphan 100-10 MG/5ML syrup  Commonly known as:  ROBITUSSIN DM  Take 15 mL by mouth every 4 (four) hours as needed for cough.     insulin aspart protamine- aspart (70-30) 100 UNIT/ML injection  Commonly known as:  NOVOLOG MIX 70/30  Inject 0.25 mL (25 Units total) into the skin 2 (two) times daily with a meal.     metoprolol 50 MG tablet  Commonly known as:  LOPRESSOR  Take 25 mg by mouth 2 (two) times daily.     phenytoin 200 MG ER capsule  Commonly known as:  DILANTIN  Take 1 capsule (200 mg total) by mouth 2 (two)  times daily.     polyethylene glycol packet  Commonly known as:  MIRALAX / GLYCOLAX  Take 17 g by mouth daily as needed (constipation).     senna-docusate 8.6-50 MG per tablet  Commonly known as:  Senokot-S  Take 1 tablet by mouth 2 (two) times daily.     sodium chloride 0.9 % injection  10-40 mL by Intracatheter route as needed (flush).     tamsulosin 0.4 MG Caps capsule  Commonly known as:  FLOMAX  Take 1 capsule (0.4 mg total) by mouth daily after supper.     timolol 0.5 % ophthalmic solution  Commonly known as:  BETIMOL  Place 1 drop into both eyes every morning.     traMADol 50 MG tablet  Commonly known as:  ULTRAM  Take 1 tablet (50 mg total) by mouth every 6 (six) hours as needed for pain. Maximum dose= 8 tablets per day     Travoprost (BAK Free) 0.004 % Soln ophthalmic solution  Commonly known as:  [name]  Place 1 drop into both eyes at bedtime.     Vancomycin 750 MG/150ML Soln  Commonly known as:  VANCOCIN  Inject 150 mL (750 mg total) into the vein every 12 (twelve) hours. X 4 weeks         Brief H and P:  For complete details please refer to admission H and P, but in brief [name]. is a 67 y.o. male who presents with an episode of LOC / AMS. He was apparently on the toilet at home when his wife found him altered. Patient states he remembers sitting down on the commode then next thing he knew there were people he didn't know in the room (EMS personell). Patient was slow to respond, he denied any headache, CP, SOB, or focal symptoms initially in the ED. All of this is occuring the context of recent RLE foot surgery for a foot ulcer and charcot foot repair. He would later during his course of stay in the ED developed fever, tachypnea, and hypotension in addition to his WBC of 17k seen on initial labs. He was also noted to have AKI with BUN of 100, his phenytoin level (takes chronically for seizure disorder) was undetectably low. She was admitted by hospitalist  service for further workup.   Hospital Course:  Pt was initially admitted to step down unit and following stabilization was transferred to floor team 8/9. On follow up still had fevers, lactic acidosis, leukocytosis thought to be due to R. Foot infection. Orthopedics, Dr. [name] had been consulted and patient was taken to the OR for I&D 8/11.   SIRS v/s sepsis - possible R foot surgical wound infection: Unclear source of infection, but likely from R foot wound. UA had remained negative, blood cultures remained negative but WBC was elevated but now trending down. Cultures showed MSSA, Dr [name] recommended 4wks of IV Vancomycin and oral cipro.  PICC line placed  - Ankle-brachial indices showed dampened monophasic with ABI 0.59, vascular surgery was consulted. The patient underwent right lower extremity angiogram on 10/31/2012. Her vascular surgery there is no role for attempted revascularization at this point. Recommended Dr. [name] to continue follow foot wound care and proceed as necessary with further amputation. Dr. [name] also recommended Silvadene dressing to the right foot 3 times a week by advanced home care.  BPH: patient c/o urinary hesitancy and urgency, no hematuria or dysuria. Patient was started on Flomax.  He has seen urologist in the past, could not remember the name.  Recommended patient to see his urologist or [facility] after DC for f/u   Toxic metabolic encephalopathy/uremia Resolved - due to above + uremia   Acute on chronic anemia - ?acute blood loss  - Hgb stable - continue to follow - Fe studies-mixed anemia, FOBT neg on 8/6  - tansfused a unit of PRBCs on 10/29/12.   AKI on CKD- stable and improving  Baseline GFR ~45 as of July 2014 and July 2013 - back to baseline after fluid and blood infused   DM2 - uncontrolled, increased insulin 70/30 to 25units BID   Hx of HTN  stable   Seizure disorder : The patient's Dilantin level was extremely low at the time of admission. His  Dilantin is increased to 200mg  BID     Day of Discharge BP 120/81  Pulse 84  Temp(Src) 98.9 F (37.2 C) (Oral)  Resp 20  Ht 5\' 11"  (1.803 m)  Wt 81 kg (178 lb 9.2 oz)  BMI 24.92 kg/m2  SpO2 98%  Physical Exam:  General: A x O x3, NAD  CVS: S1-S2 clear, no murmur rubs or gallops  Chest: CTAB  Abdomen: soft NT, ND, NBS  Extremities: RLE dressing intact, left lower extremity BKA   The results of significant diagnostics from this hospitalization (including  imaging, microbiology, ancillary and laboratory) are listed below for reference.    LAB RESULTS: Basic Metabolic Panel:  Recent Labs Lab 10/31/12 0445 11/01/12 0500  NA 132* 136  K 4.2 4.2  CL 105 108  CO2 18* 18*  GLUCOSE 129* 187*  BUN 21 18  CREATININE 1.57* 1.40*  CALCIUM 8.6 8.6   Liver Function Tests:  Recent Labs Lab 10/31/12 0445  ALBUMIN 1.6*   No results found for this basename: LIPASE, AMYLASE,  in the last 168 hours No results found for this basename: AMMONIA,  in the last 168 hours CBC:  Recent Labs Lab 10/31/12 0445 11/01/12 0500  WBC 14.4* 14.5*  HGB 9.1* 8.8*  HCT 26.0* 25.8*  MCV 82.5 83.0  PLT 342 337   Cardiac Enzymes: No results found for this basename: CKTOTAL, CKMB, CKMBINDEX, TROPONINI,  in the last 168 hours BNP: No components found with this basename: POCBNP,  CBG:  Recent Labs Lab 11/01/12 1153 11/01/12 1645  GLUCAP 141* 99    Significant Diagnostic Studies:  US Renal  10/23/2012   *RADIOLOGY REPORT*  Clinical Data: Acute renal insufficiency.  RENAL/URINARY TRACT ULTRASOUND COMPLETE  Comparison:  None.  Findings:  Right Kidney:  Measures 11.7 cm.  No stone, mass or hydronephrosis. Mildly increased cortical echogenicity is noted.  Left Kidney:  Measures 12.2 cm.  No stone or hydronephrosis. A 0.5 cm hyperechoic focus with posterior shadowing compatible with a stone is noted.  Cortical echogenicity is somewhat increased.  Bladder:  Bilateral ureteral jets are  visualized. The prostate gland appears prominent measuring 4.3 x 3.8 x 4.8 cm.  IMPRESSION:  1.  Negative for hydronephrosis or other acute abnormality. 2.  Increased cortical echogenicity of the kidneys compatible with medical renal disease. 3.  0.5 cm nonobstructing stone mid pole left kidney.   Original Report Authenticated By: Orlean Patten, M.D.       Disposition and Follow-up: Discharge Orders   Future Orders Complete By Expires   Diet Carb Modified  As directed    Discharge instructions  As directed    Comments:     Wound Care: Silvadene dressing changes to the right foot 3 times a week   Increase activity slowly  As directed        DISPOSITION: Home  DIET: Carb modified diet  ACTIVITY: As tolerated  TESTS THAT NEED FOLLOW-UP CBC, BMET, Dilantin level, vanc trough  DISCHARGE FOLLOW-UP Follow-up Information   Follow up with [name], MD. Schedule an appointment as soon as possible for a visit in 2 weeks. (for hospital follow-up)    Specialty:  Endocrinology   Contact information:   [address] [place] [address] [phone]       Follow up with DUDA,MARCUS V, MD. Schedule an appointment as soon as possible for a visit in 1 week. (FOR FOLLOW-UP)    Specialty:  Orthopedic Surgery   Contact information:   [place] [address] [phone]       Schedule an appointment as soon as possible for a visit with [place]. (for prostrate check-up  as needed )    Contact information:   [address]  [place] [address] [phone]      Time spent on Discharge: 45 mins  Signed:   Sena Hoopingarner M.D. [facility] 11/01/2012, 5:02 PM Pager: [phone]

## 2012-11-01 NOTE — Progress Notes (Signed)
ANTIBIOTIC CONSULT NOTE - FOLLOW UP  Pharmacy Consult for Vancomycin and Ciprofloxacin Indication: right foot infection s/p surgery  Allergies  Allergen Reactions  . Codeine Anaphylaxis  . Penicillins Anaphylaxis    Patient Measurements: Height: 5\' 11"  (180.3 cm) Weight: 178 lb 9.2 oz (81 kg) IBW/kg (Calculated) : 75.3  Vital Signs: Temp: 98.2 F (36.8 C) (08/15 0636) Temp src: Axillary (08/15 0636) BP: 124/67 mmHg (08/15 0636) Pulse Rate: 91 (08/15 0636) Intake/Output from previous day: 08/14 0701 - 08/15 0700 In: -  Out: 450 [Urine:450] Intake/Output from this shift:    Labs:  Recent Labs  10/30/12 0400 10/31/12 0445 11/01/12 0500  WBC 14.9* 14.4* 14.5*  HGB 8.4* 9.1* 8.8*  PLT 311 342 337  CREATININE 1.58* 1.57* 1.40*   Estimated Creatinine Clearance: 56 ml/min (by C-G formula based on Cr of 1.4).   Microbiology: Recent Results (from the past 720 hour(s))  CULTURE, BLOOD (ROUTINE X 2)     Status: None   Collection Time    10/23/12  4:15 AM      Result Value Range Status   Specimen Description BLOOD LEFT ARM   Final   Special Requests BOTTLES DRAWN AEROBIC AND ANAEROBIC 10CC EACH   Final   Culture  Setup Time     Final   Value: 10/23/2012 10:20     Performed at [facility]   Culture     Final   Value: NO GROWTH 5 DAYS     Performed at [facility]   Report Status 10/28/2012 FINAL   Final  CULTURE, BLOOD (ROUTINE X 2)     Status: None   Collection Time    10/23/12  4:15 AM      Result Value Range Status   Specimen Description BLOOD LEFT HAND   Final   Special Requests BOTTLES DRAWN AEROBIC ONLY 10CC   Final   Culture  Setup Time     Final   Value: 10/23/2012 10:19     Performed at [facility]   Culture     Final   Value: NO GROWTH 5 DAYS     Performed at [facility]   Report Status 10/28/2012 FINAL   Final  MRSA PCR SCREENING     Status: None   Collection Time    10/23/12  5:41 AM      Result Value Range  Status   MRSA by PCR NEGATIVE  NEGATIVE Final   Comment:            The GeneXpert MRSA Assay (FDA     approved for NASAL specimens     only), is one component of a     comprehensive MRSA colonization     surveillance program. It is not     intended to diagnose MRSA     infection nor to guide or     monitor treatment for     MRSA infections.  ANAEROBIC CULTURE     Status: None   Collection Time    10/28/12  5:23 PM      Result Value Range Status   Specimen Description ABSCESS FOOT RIGHT   Final   Special Requests NONE POF VANCOMYCIN CIPRO   Final   Gram Stain PENDING   Incomplete   Culture     Final   Value: NO ANAEROBES ISOLATED; CULTURE IN PROGRESS FOR 5 DAYS     Performed at [facility]   Report Status PENDING   Incomplete  CULTURE, ROUTINE-ABSCESS     Status: None   Collection Time    10/28/12  5:23 PM      Result Value Range Status   Specimen Description ABSCESS FOOT RIGHT   Final   Special Requests NONE POF VANCOMYCIN CIPRO   Final   Gram Stain     Final   Value: NO WBC SEEN     NO SQUAMOUS EPITHELIAL CELLS SEEN     NO ORGANISMS SEEN     Performed at [facility]   Culture     Final   Value: MODERATE STAPHYLOCOCCUS AUREUS     Note: RIFAMPIN AND GENTAMICIN SHOULD NOT BE USED AS SINGLE DRUGS FOR TREATMENT OF STAPH INFECTIONS.     Performed at [facility]   Report Status 10/31/2012 FINAL   Final   Organism ID, Bacteria STAPHYLOCOCCUS AUREUS   Final    Anti-infectives   Start     Dose/Rate Route Frequency Ordered Stop   10/28/12 1731  vancomycin (VANCOCIN) powder  Status:  Discontinued       As needed 10/28/12 1732 10/28/12 1753   10/28/12 1730  gentamicin (GARAMYCIN) injection  Status:  Discontinued       As needed 10/28/12 1731 10/28/12 1753   10/25/12 1000  vancomycin (VANCOCIN) IVPB 750 mg/150 ml premix     750 mg 150 mL/hr over 60 Minutes Intravenous Every 12 hours 10/25/12 0941     10/25/12 0500  vancomycin (VANCOCIN) IVPB 1000  mg/200 mL premix  Status:  Discontinued     1,000 mg 200 mL/hr over 60 Minutes Intravenous Every 48 hours 10/23/12 0439 10/23/12 1156   10/24/12 1800  ciprofloxacin (CIPRO) IVPB 400 mg     400 mg 200 mL/hr over 60 Minutes Intravenous Every 12 hours 10/24/12 1310     10/23/12 0500  ciprofloxacin (CIPRO) IVPB 400 mg  Status:  Discontinued     400 mg 200 mL/hr over 60 Minutes Intravenous Every 24 hours 10/23/12 0439 10/24/12 1310   10/23/12 0500  vancomycin (VANCOCIN) 1,500 mg in sodium chloride 0.9 % 500 mL IVPB     1,500 mg 250 mL/hr over 120 Minutes Intravenous  Once 10/23/12 0439 10/23/12 0655     Assessment: 67 yo M s/p surgery for right foot infection continues on IV antibiotics. PICC line placed 8/12 for home antibiotics (anticipate 6 weeks). Renal function has remained stable. Last vanc trough was drawn on 8/10 and was at goal at 18.9. No changes made. 8/11 Abscess cultures show moderate staph auerus, sens clinda, oxacillin, vanc.  Noted patient missed Vancomycin 8/14 while off the floor for vascular surgery procedure.  Last Vancomycin dose given ~ 0400 this AM.  Will adjust times accordingly.  Goal of Therapy:  Vancomycin trough level 15-20 mcg/ml  Plan:  1. Continue vancomycin 750mg  IV Q12h - adjust times to 0600/1800 2. Continue ciprofloxacin 400mg  IV Q12h 3. Follow c/s, dispo plans, renal function, WBC and fever. 4. Consider repeat Vancomycin trough Sunday if pt still here.  [employer], Pharm.D., BCPS Clinical Pharmacist Pager [phone] 11/01/2012 10:48 AM

## 2012-11-16 ENCOUNTER — Encounter (HOSPITAL_COMMUNITY): Payer: Self-pay | Admitting: Emergency Medicine

## 2012-11-16 ENCOUNTER — Inpatient Hospital Stay (HOSPITAL_COMMUNITY)
Admission: EM | Admit: 2012-11-16 | Discharge: 2012-11-25 | DRG: 871 | Disposition: A | Discharge status: Home / Self Care | Payer: Medicare Other | Attending: Internal Medicine | Admitting: Internal Medicine

## 2012-11-16 DIAGNOSIS — L97519 Non-pressure chronic ulcer of other part of right foot with unspecified severity: Secondary | ICD-10-CM | POA: Diagnosis present

## 2012-11-16 DIAGNOSIS — D649 Anemia, unspecified: Secondary | ICD-10-CM

## 2012-11-16 DIAGNOSIS — R4182 Altered mental status, unspecified: Secondary | ICD-10-CM

## 2012-11-16 DIAGNOSIS — J189 Pneumonia, unspecified organism: Secondary | ICD-10-CM

## 2012-11-16 DIAGNOSIS — E1142 Type 2 diabetes mellitus with diabetic polyneuropathy: Secondary | ICD-10-CM | POA: Diagnosis present

## 2012-11-16 DIAGNOSIS — E1149 Type 2 diabetes mellitus with other diabetic neurological complication: Secondary | ICD-10-CM | POA: Diagnosis present

## 2012-11-16 DIAGNOSIS — Z8673 Personal history of transient ischemic attack (TIA), and cerebral infarction without residual deficits: Secondary | ICD-10-CM

## 2012-11-16 DIAGNOSIS — I10 Essential (primary) hypertension: Secondary | ICD-10-CM | POA: Diagnosis present

## 2012-11-16 DIAGNOSIS — Z79899 Other long term (current) drug therapy: Secondary | ICD-10-CM

## 2012-11-16 DIAGNOSIS — G929 Unspecified toxic encephalopathy: Secondary | ICD-10-CM | POA: Diagnosis present

## 2012-11-16 DIAGNOSIS — N183 Chronic kidney disease, stage 3 unspecified: Secondary | ICD-10-CM | POA: Diagnosis present

## 2012-11-16 DIAGNOSIS — L97509 Non-pressure chronic ulcer of other part of unspecified foot with unspecified severity: Secondary | ICD-10-CM | POA: Diagnosis present

## 2012-11-16 DIAGNOSIS — Z880 Allergy status to penicillin: Secondary | ICD-10-CM

## 2012-11-16 DIAGNOSIS — M9080 Osteopathy in diseases classified elsewhere, unspecified site: Secondary | ICD-10-CM | POA: Diagnosis present

## 2012-11-16 DIAGNOSIS — Z833 Family history of diabetes mellitus: Secondary | ICD-10-CM

## 2012-11-16 DIAGNOSIS — G92 Toxic encephalopathy: Secondary | ICD-10-CM | POA: Diagnosis present

## 2012-11-16 DIAGNOSIS — M869 Osteomyelitis, unspecified: Secondary | ICD-10-CM | POA: Diagnosis present

## 2012-11-16 DIAGNOSIS — E1169 Type 2 diabetes mellitus with other specified complication: Secondary | ICD-10-CM | POA: Diagnosis present

## 2012-11-16 DIAGNOSIS — A419 Sepsis, unspecified organism: Principal | ICD-10-CM

## 2012-11-16 DIAGNOSIS — Z794 Long term (current) use of insulin: Secondary | ICD-10-CM

## 2012-11-16 DIAGNOSIS — S88119A Complete traumatic amputation at level between knee and ankle, unspecified lower leg, initial encounter: Secondary | ICD-10-CM

## 2012-11-16 DIAGNOSIS — R64 Cachexia: Secondary | ICD-10-CM | POA: Diagnosis present

## 2012-11-16 DIAGNOSIS — S98139A Complete traumatic amputation of one unspecified lesser toe, initial encounter: Secondary | ICD-10-CM

## 2012-11-16 DIAGNOSIS — E119 Type 2 diabetes mellitus without complications: Secondary | ICD-10-CM

## 2012-11-16 DIAGNOSIS — D638 Anemia in other chronic diseases classified elsewhere: Secondary | ICD-10-CM | POA: Diagnosis present

## 2012-11-16 DIAGNOSIS — I129 Hypertensive chronic kidney disease with stage 1 through stage 4 chronic kidney disease, or unspecified chronic kidney disease: Secondary | ICD-10-CM | POA: Diagnosis present

## 2012-11-16 DIAGNOSIS — Z8249 Family history of ischemic heart disease and other diseases of the circulatory system: Secondary | ICD-10-CM

## 2012-11-16 DIAGNOSIS — Z7982 Long term (current) use of aspirin: Secondary | ICD-10-CM

## 2012-11-16 DIAGNOSIS — E1129 Type 2 diabetes mellitus with other diabetic kidney complication: Secondary | ICD-10-CM | POA: Diagnosis present

## 2012-11-16 DIAGNOSIS — N179 Acute kidney failure, unspecified: Secondary | ICD-10-CM | POA: Diagnosis present

## 2012-11-16 DIAGNOSIS — I739 Peripheral vascular disease, unspecified: Secondary | ICD-10-CM | POA: Diagnosis present

## 2012-11-16 HISTORY — DX: Pneumonia, unspecified organism: J18.9

## 2012-11-16 HISTORY — DX: Anemia, unspecified: D64.9

## 2012-11-16 LAB — GLUCOSE, CAPILLARY: Glucose-Capillary: 154 mg/dL — ABNORMAL HIGH (ref 70–99)

## 2012-11-16 LAB — CBC WITH DIFFERENTIAL/PLATELET
Basophils Absolute: 0.1 10*3/uL (ref 0.0–0.1)
Basophils Relative: 0 % (ref 0–1)
Eosinophils Absolute: 1.4 10*3/uL — ABNORMAL HIGH (ref 0.0–0.7)
Eosinophils Relative: 10 % — ABNORMAL HIGH (ref 0–5)
HCT: 19.1 % — ABNORMAL LOW (ref 39.0–52.0)
MCH: 28.5 pg (ref 26.0–34.0)
MCHC: 33 g/dL (ref 30.0–36.0)
MCV: 86.4 fL (ref 78.0–100.0)
Monocytes Absolute: 1.7 10*3/uL — ABNORMAL HIGH (ref 0.1–1.0)
Neutro Abs: 9.5 10*3/uL — ABNORMAL HIGH (ref 1.7–7.7)
RDW: 16.4 % — ABNORMAL HIGH (ref 11.5–15.5)

## 2012-11-16 NOTE — ED Notes (Signed)
Retricted arm band was placed on pts R upper extremity.

## 2012-11-16 NOTE — ED Notes (Signed)
Family reported pt. Has low grade fever with back ( " feels hot" ) pain for several days , denies injury .

## 2012-11-16 NOTE — ED Notes (Signed)
CBG was 154.

## 2012-11-17 ENCOUNTER — Encounter (HOSPITAL_COMMUNITY): Payer: Self-pay | Admitting: *Deleted

## 2012-11-17 ENCOUNTER — Emergency Department (HOSPITAL_COMMUNITY): Payer: Medicare Other

## 2012-11-17 DIAGNOSIS — J189 Pneumonia, unspecified organism: Secondary | ICD-10-CM | POA: Diagnosis present

## 2012-11-17 DIAGNOSIS — I10 Essential (primary) hypertension: Secondary | ICD-10-CM

## 2012-11-17 DIAGNOSIS — E119 Type 2 diabetes mellitus without complications: Secondary | ICD-10-CM

## 2012-11-17 DIAGNOSIS — A419 Sepsis, unspecified organism: Secondary | ICD-10-CM | POA: Diagnosis present

## 2012-11-17 DIAGNOSIS — D649 Anemia, unspecified: Secondary | ICD-10-CM | POA: Diagnosis present

## 2012-11-17 DIAGNOSIS — R4182 Altered mental status, unspecified: Secondary | ICD-10-CM

## 2012-11-17 DIAGNOSIS — L97509 Non-pressure chronic ulcer of other part of unspecified foot with unspecified severity: Secondary | ICD-10-CM

## 2012-11-17 LAB — GLUCOSE, CAPILLARY
Glucose-Capillary: 108 mg/dL — ABNORMAL HIGH (ref 70–99)
Glucose-Capillary: 125 mg/dL — ABNORMAL HIGH (ref 70–99)
Glucose-Capillary: 174 mg/dL — ABNORMAL HIGH (ref 70–99)
Glucose-Capillary: 247 mg/dL — ABNORMAL HIGH (ref 70–99)

## 2012-11-17 LAB — URINALYSIS, ROUTINE W REFLEX MICROSCOPIC
Bilirubin Urine: NEGATIVE
Glucose, UA: NEGATIVE mg/dL
Ketones, ur: NEGATIVE mg/dL
Leukocytes, UA: NEGATIVE
Protein, ur: 30 mg/dL — AB
pH: 5 (ref 5.0–8.0)

## 2012-11-17 LAB — POCT I-STAT TROPONIN I: Troponin i, poc: 0.07 ng/mL (ref 0.00–0.08)

## 2012-11-17 LAB — BASIC METABOLIC PANEL
BUN: 22 mg/dL (ref 6–23)
CO2: 20 mEq/L (ref 19–32)
Chloride: 104 mEq/L (ref 96–112)
Creatinine, Ser: 1.58 mg/dL — ABNORMAL HIGH (ref 0.50–1.35)
GFR calc Af Amer: 51 mL/min — ABNORMAL LOW (ref >90)

## 2012-11-17 LAB — CBC
HCT: 17.2 % — ABNORMAL LOW (ref 39.0–52.0)
MCHC: 33.1 g/dL (ref 30.0–36.0)
MCV: 86 fL (ref 78.0–100.0)
RDW: 16.3 % — ABNORMAL HIGH (ref 11.5–15.5)

## 2012-11-17 LAB — HEMOGLOBIN AND HEMATOCRIT, BLOOD
HCT: 20.8 % — ABNORMAL LOW (ref 39.0–52.0)
Hemoglobin: 7 g/dL — ABNORMAL LOW (ref 13.0–17.0)

## 2012-11-17 LAB — PREPARE RBC (CROSSMATCH)

## 2012-11-17 LAB — POCT I-STAT 3, ART BLOOD GAS (G3+)
Acid-base deficit: 5 mmol/L — ABNORMAL HIGH (ref 0.0–2.0)
Bicarbonate: 19.1 mEq/L — ABNORMAL LOW (ref 20.0–24.0)
Patient temperature: 100.3
pH, Arterial: 7.394 (ref 7.350–7.450)

## 2012-11-17 LAB — PRO B NATRIURETIC PEPTIDE: Pro B Natriuretic peptide (BNP): 4406 pg/mL — ABNORMAL HIGH (ref 0–125)

## 2012-11-17 LAB — FERRITIN: Ferritin: 677 ng/mL — ABNORMAL HIGH (ref 22–322)

## 2012-11-17 LAB — IRON AND TIBC: TIBC: 145 ug/dL — ABNORMAL LOW (ref 215–435)

## 2012-11-17 LAB — COMPREHENSIVE METABOLIC PANEL
AST: 35 U/L (ref 0–37)
Albumin: 2.1 g/dL — ABNORMAL LOW (ref 3.5–5.2)
BUN: 23 mg/dL (ref 6–23)
Calcium: 8.2 mg/dL — ABNORMAL LOW (ref 8.4–10.5)
Creatinine, Ser: 1.58 mg/dL — ABNORMAL HIGH (ref 0.50–1.35)
Total Protein: 7.2 g/dL (ref 6.0–8.3)

## 2012-11-17 LAB — PHENYTOIN LEVEL, TOTAL: Phenytoin Lvl: 3.9 ug/mL — ABNORMAL LOW (ref 10.0–20.0)

## 2012-11-17 LAB — CG4 I-STAT (LACTIC ACID): Lactic Acid, Venous: 1.09 mmol/L (ref 0.5–2.2)

## 2012-11-17 LAB — RETICULOCYTES
RBC.: 2.12 MIL/uL — ABNORMAL LOW (ref 4.22–5.81)
Retic Count, Absolute: 180.2 10*3/uL (ref 19.0–186.0)

## 2012-11-17 LAB — C-REACTIVE PROTEIN: CRP: 20 mg/dL — ABNORMAL HIGH (ref <0.60)

## 2012-11-17 LAB — SEDIMENTATION RATE: Sed Rate: 118 mm/hr — ABNORMAL HIGH (ref 0–16)

## 2012-11-17 LAB — URINE MICROSCOPIC-ADD ON

## 2012-11-17 LAB — MRSA PCR SCREENING: MRSA by PCR: NEGATIVE

## 2012-11-17 MED ORDER — ACETAMINOPHEN 325 MG PO TABS
650.0000 mg | ORAL_TABLET | Freq: Four times a day (QID) | ORAL | Status: DC | PRN
  Administered 2012-11-17: 650 mg via ORAL
  Filled 2012-11-17: qty 2

## 2012-11-17 MED ORDER — ENOXAPARIN SODIUM 40 MG/0.4ML ~~LOC~~ SOLN
40.0000 mg | SUBCUTANEOUS | Status: DC
  Filled 2012-11-17: qty 0.4

## 2012-11-17 MED ORDER — SODIUM CHLORIDE 0.9 % IV BOLUS (SEPSIS)
1000.0000 mL | Freq: Once | INTRAVENOUS | Status: AC
  Administered 2012-11-17: 1000 mL via INTRAVENOUS

## 2012-11-17 MED ORDER — ASPIRIN 325 MG PO TABS
325.0000 mg | ORAL_TABLET | Freq: Every day | ORAL | Status: DC
  Administered 2012-11-18 – 2012-11-25 (×8): 325 mg via ORAL
  Filled 2012-11-17 (×9): qty 1

## 2012-11-17 MED ORDER — SODIUM CHLORIDE 0.9 % IV SOLN
INTRAVENOUS | Status: DC
  Administered 2012-11-17: 100 mL/h via INTRAVENOUS

## 2012-11-17 MED ORDER — ONDANSETRON HCL 4 MG/2ML IJ SOLN: 4.0000 mg | Freq: Four times a day (QID) | INTRAMUSCULAR | Status: DC | PRN

## 2012-11-17 MED ORDER — FUROSEMIDE 10 MG/ML IJ SOLN
20.0000 mg | Freq: Once | INTRAMUSCULAR | Status: AC
  Administered 2012-11-17: 20 mg via INTRAVENOUS

## 2012-11-17 MED ORDER — POLYETHYLENE GLYCOL 3350 17 G PO PACK
17.0000 g | PACK | Freq: Every day | ORAL | Status: DC | PRN
  Filled 2012-11-17: qty 1

## 2012-11-17 MED ORDER — ONDANSETRON HCL 4 MG PO TABS: 4.0000 mg | ORAL_TABLET | Freq: Four times a day (QID) | ORAL | Status: DC | PRN

## 2012-11-17 MED ORDER — HYDROMORPHONE HCL PF 1 MG/ML IJ SOLN
0.5000 mg | INTRAMUSCULAR | Status: DC | PRN
  Administered 2012-11-17: 1 mg via INTRAVENOUS
  Filled 2012-11-17: qty 1

## 2012-11-17 MED ORDER — HEPARIN SODIUM (PORCINE) 5000 UNIT/ML IJ SOLN
5000.0000 [IU] | Freq: Three times a day (TID) | INTRAMUSCULAR | Status: DC
  Administered 2012-11-17 – 2012-11-25 (×23): 5000 [IU] via SUBCUTANEOUS
  Filled 2012-11-17 (×27): qty 1

## 2012-11-17 MED ORDER — SODIUM CHLORIDE 0.9 % IV SOLN
500.0000 mg | Freq: Three times a day (TID) | INTRAVENOUS | Status: DC
  Filled 2012-11-17 (×2): qty 500

## 2012-11-17 MED ORDER — VANCOMYCIN HCL IN DEXTROSE 1-5 GM/200ML-% IV SOLN: 1000.0000 mg | Freq: Once | INTRAVENOUS | Status: DC

## 2012-11-17 MED ORDER — VANCOMYCIN HCL IN DEXTROSE 750-5 MG/150ML-% IV SOLN
750.0000 mg | Freq: Two times a day (BID) | INTRAVENOUS | Status: DC
  Administered 2012-11-17 – 2012-11-18 (×3): 750 mg via INTRAVENOUS
  Filled 2012-11-17 (×7): qty 150

## 2012-11-17 MED ORDER — ALUM & MAG HYDROXIDE-SIMETH 200-200-20 MG/5ML PO SUSP: 30.0000 mL | Freq: Four times a day (QID) | ORAL | Status: DC | PRN

## 2012-11-17 MED ORDER — METOPROLOL TARTRATE 25 MG PO TABS
25.0000 mg | ORAL_TABLET | Freq: Two times a day (BID) | ORAL | Status: DC
  Administered 2012-11-17 – 2012-11-25 (×17): 25 mg via ORAL
  Filled 2012-11-17 (×18): qty 1

## 2012-11-17 MED ORDER — SODIUM CHLORIDE 0.9 % IV SOLN
500.0000 mg | Freq: Three times a day (TID) | INTRAVENOUS | Status: AC
  Administered 2012-11-17 – 2012-11-23 (×20): 500 mg via INTRAVENOUS
  Filled 2012-11-17 (×21): qty 500

## 2012-11-17 MED ORDER — ZOLPIDEM TARTRATE 5 MG PO TABS: 5.0000 mg | ORAL_TABLET | Freq: Every evening | ORAL | Status: DC | PRN

## 2012-11-17 MED ORDER — ACETAMINOPHEN 650 MG RE SUPP: 650.0000 mg | Freq: Four times a day (QID) | RECTAL | Status: DC | PRN

## 2012-11-17 MED ORDER — FUROSEMIDE 10 MG/ML IJ SOLN
INTRAMUSCULAR | Status: AC
  Administered 2012-11-17: 18:00:00
  Filled 2012-11-17: qty 4

## 2012-11-17 MED ORDER — PHENYTOIN SODIUM EXTENDED 100 MG PO CAPS
200.0000 mg | ORAL_CAPSULE | Freq: Two times a day (BID) | ORAL | Status: DC
  Administered 2012-11-17 – 2012-11-25 (×17): 200 mg via ORAL
  Filled 2012-11-17 (×18): qty 2

## 2012-11-17 MED ORDER — SODIUM CHLORIDE 0.9 % IV BOLUS (SEPSIS): 1000.0000 mL | Freq: Once | INTRAVENOUS | Status: DC

## 2012-11-17 MED ORDER — INSULIN GLARGINE 100 UNIT/ML ~~LOC~~ SOLN
24.0000 [IU] | Freq: Every day | SUBCUTANEOUS | Status: DC
  Administered 2012-11-17 – 2012-11-22 (×4): 24 [IU] via SUBCUTANEOUS
  Filled 2012-11-17 (×7): qty 0.24

## 2012-11-17 MED ORDER — ALBUTEROL SULFATE (5 MG/ML) 0.5% IN NEBU
2.5000 mg | INHALATION_SOLUTION | RESPIRATORY_TRACT | Status: DC | PRN
  Filled 2012-11-17: qty 0.5

## 2012-11-17 MED ORDER — OXYCODONE HCL 5 MG PO TABS: 5.0000 mg | ORAL_TABLET | ORAL | Status: DC | PRN

## 2012-11-17 MED ORDER — LATANOPROST 0.005 % OP SOLN
1.0000 [drp] | Freq: Every day | OPHTHALMIC | Status: DC
  Administered 2012-11-17 – 2012-11-24 (×6): 1 [drp] via OPHTHALMIC
  Filled 2012-11-17 (×2): qty 2.5

## 2012-11-17 MED ORDER — SODIUM CHLORIDE 0.9 % IJ SOLN
10.0000 mL | INTRAMUSCULAR | Status: DC | PRN
  Administered 2012-11-17 – 2012-11-25 (×4): 10 mL

## 2012-11-17 MED ORDER — SODIUM CHLORIDE 0.9 % IV SOLN
500.0000 mg | Freq: Once | INTRAVENOUS | Status: AC
  Administered 2012-11-17: 500 mg via INTRAVENOUS
  Filled 2012-11-17: qty 500

## 2012-11-17 MED ORDER — ALBUTEROL SULFATE (5 MG/ML) 0.5% IN NEBU
2.5000 mg | INHALATION_SOLUTION | Freq: Three times a day (TID) | RESPIRATORY_TRACT | Status: DC
  Administered 2012-11-18 – 2012-11-23 (×14): 2.5 mg via RESPIRATORY_TRACT
  Filled 2012-11-17 (×18): qty 0.5

## 2012-11-17 MED ORDER — ATORVASTATIN CALCIUM 20 MG PO TABS
20.0000 mg | ORAL_TABLET | Freq: Every day | ORAL | Status: DC
  Administered 2012-11-17 – 2012-11-25 (×9): 20 mg via ORAL
  Filled 2012-11-17 (×9): qty 1

## 2012-11-17 MED ORDER — TIMOLOL MALEATE 0.5 % OP SOLN
1.0000 [drp] | Freq: Every day | OPHTHALMIC | Status: DC
  Administered 2012-11-17 – 2012-11-25 (×9): 1 [drp] via OPHTHALMIC
  Filled 2012-11-17: qty 5

## 2012-11-17 MED ORDER — TAMSULOSIN HCL 0.4 MG PO CAPS
0.4000 mg | ORAL_CAPSULE | Freq: Every day | ORAL | Status: DC
  Administered 2012-11-17 – 2012-11-24 (×8): 0.4 mg via ORAL
  Filled 2012-11-17 (×9): qty 1

## 2012-11-17 MED ORDER — INSULIN ASPART 100 UNIT/ML ~~LOC~~ SOLN
0.0000 [IU] | SUBCUTANEOUS | Status: DC
  Administered 2012-11-17: 3 [IU] via SUBCUTANEOUS
  Administered 2012-11-17: 21:00:00 via SUBCUTANEOUS
  Administered 2012-11-18: 3 [IU] via SUBCUTANEOUS
  Administered 2012-11-18: 2 [IU] via SUBCUTANEOUS
  Administered 2012-11-18: 5 [IU] via SUBCUTANEOUS
  Administered 2012-11-18 – 2012-11-19 (×3): 2 [IU] via SUBCUTANEOUS
  Administered 2012-11-19: 1 [IU] via SUBCUTANEOUS
  Administered 2012-11-19 – 2012-11-20 (×4): 3 [IU] via SUBCUTANEOUS
  Administered 2012-11-20: 1 [IU] via SUBCUTANEOUS
  Administered 2012-11-20 (×2): 2 [IU] via SUBCUTANEOUS

## 2012-11-17 MED ORDER — ALBUTEROL SULFATE (5 MG/ML) 0.5% IN NEBU
2.5000 mg | INHALATION_SOLUTION | Freq: Four times a day (QID) | RESPIRATORY_TRACT | Status: DC
  Administered 2012-11-17 (×4): 2.5 mg via RESPIRATORY_TRACT
  Filled 2012-11-17 (×4): qty 0.5

## 2012-11-17 NOTE — Progress Notes (Signed)
Pt receiving PRBC at this time. HGB/HCT not obtained. Will obtain after transfusion complete.

## 2012-11-17 NOTE — H&P (Addendum)
[facility] History and Physical  [name]. [id number] DOB: 19-Aug-1945 DOA: 11/16/2012  Referring physician:  EDP PCP: [name], MD  Specialists:   Chief Complaint:   HPI: [name]. is a 67 y.o. male with multiple medical problems who was brought to the ED with complaints of confusion Coughing and fever and chills over the past 2 days.   He was inpatient 2 weeks ago for an I+D of a diabetic foot ulcer on the right lateral foot that was performed by Dr [name].   He was discharge to home on 08/15.   He was found on evaluation in the ED to have pneumonia, and anemia with a hb =6.   Review of Systems: The patient denies anorexia, fever, chills, headaches, weight loss,, vision loss, diplopia, dizziness, decreased hearing, rhinitis, hoarseness, chest pain, syncope, dyspnea on exertion, peripheral edema, balance deficits, cough, hemoptysis, abdominal pain, nausea, vomiting, diarrhea, constipation, hematemesis, melena, hematochezia, severe indigestion/heartburn, dysuria, hematuria, incontinence, muscle weakness, suspicious skin lesions, transient blindness, difficulty walking, depression, unusual weight change, abnormal bleeding, enlarged lymph nodes, angioedema, and breast masses.    Past Medical History  Diagnosis Date  . Diabetes mellitus   . Hypertension   . Seizures   . Stroke   . Peripheral vascular disease   . CKD (chronic kidney disease)     Stage II (per 06/2010 notes)  . Neuromuscular disorder     Past Surgical History  Procedure Laterality Date  . Colon surgery      partial colectomy  . Eye surgery      bilat cataract surgery  . Below knee leg amputation      L  . Toe amputation      R two  . Amputation  10/10/2011    Procedure: AMPUTATION DIGIT;  Surgeon: Newt Minion, MD;  Location: [facility];  Service: Orthopedics;  Laterality: Right;  Right Foot 4th Toe Amputation  . Orif toe fracture Right 10/09/2012    Procedure: OPEN REDUCTION INTERNAL  FIXATION (ORIF) METATARSAL (TOE) FRACTURE;  Surgeon: Newt Minion, MD;  Location: [place];  Service: Orthopedics;  Laterality: Right;  Right Foot Base 1st Metatarsal and Medial Cuneoform Excision, Internal Fixation, Antibiotic Beads  . I&d extremity Right 10/28/2012    Procedure: IRRIGATION AND DEBRIDEMENT EXTREMITY;  Surgeon: Newt Minion, MD;  Location: [place];  Service: Orthopedics;  Laterality: Right;  Irrigation and Debridement Right Foot, Remove Deep Hardware, Place Antibiotic Beads    Prior to Admission medications   Medication Sig Start Date End Date Taking? Authorizing Provider  aspirin 325 MG tablet Take 325 mg by mouth daily.   Yes Historical Provider, MD  atorvastatin (LIPITOR) 20 MG tablet Take 20 mg by mouth daily.   Yes Historical Provider, MD  ciprofloxacin (CIPRO) 250 MG tablet Take 250 mg by mouth 2 (two) times daily. 11/11/12 12/09/12 Yes Historical Provider, MD  guaiFENesin-dextromethorphan (ROBITUSSIN DM) 100-10 MG/5ML syrup Take 15 mLs by mouth every 4 (four) hours as needed for cough. 11/01/12  Yes Ripudeep Krystal Eaton, MD  insulin aspart protamine- aspart (NOVOLOG MIX 70/30) (70-30) 100 UNIT/ML injection Inject 6-15 Units into the skin See admin instructions. Takes 15 units in the morning, and 6 units in the evening 11/01/12  Yes Ripudeep K Rai, MD  insulin glargine (LANTUS) 100 UNIT/ML injection Inject 24 Units into the skin at bedtime.   Yes Historical Provider, MD  meloxicam (MOBIC) 7.5 MG tablet Take 7.5 mg by mouth daily.  Yes Historical Provider, MD  metoprolol (LOPRESSOR) 50 MG tablet Take 25 mg by mouth 2 (two) times daily.   Yes Historical Provider, MD  phenytoin (DILANTIN) 200 MG ER capsule Take 1 capsule (200 mg total) by mouth 2 (two) times daily. 11/01/12  Yes Ripudeep Krystal Eaton, MD  polyethylene glycol (MIRALAX / GLYCOLAX) packet Take 17 g by mouth daily as needed (constipation). 11/01/12  Yes Ripudeep Krystal Eaton, MD  senna-docusate (SENOKOT-S) 8.6-50 MG per tablet Take 1 tablet by  mouth daily. 11/01/12  Yes Ripudeep K Rai, MD  sodium chloride 0.9 % injection 10-40 mL by Intracatheter route as needed (flush). 11/01/12  Yes Ripudeep Krystal Eaton, MD  tamsulosin (FLOMAX) 0.4 MG CAPS capsule Take 1 capsule (0.4 mg total) by mouth daily after supper. 11/01/12  Yes Ripudeep Krystal Eaton, MD  timolol (BETIMOL) 0.5 % ophthalmic solution Place 1 drop into both eyes every morning.    Yes Historical Provider, MD  traMADol (ULTRAM) 50 MG tablet Take 1 tablet (50 mg total) by mouth every 6 (six) hours as needed for pain. Maximum dose= 8 tablets per day 10/10/12  Yes Newt Minion, MD  Travoprost, BAK Free, ([name]) 0.004 % SOLN ophthalmic solution Place 1 drop into both eyes at bedtime.   Yes Historical Provider, MD  VANCOMYCIN HCL IV Inject 1,000 mg into the vein every 12 (twelve) hours. 11/11/12 12/09/12 Yes Historical Provider, MD     Allergies  Allergen Reactions  . Codeine Anaphylaxis  . Penicillins Anaphylaxis     Social History:  Married  reports that he has never smoked. He has never used smokeless tobacco. He reports that he drinks about 0.6 ounces of alcohol per week. He reports that he does not use illicit drugs.     Family History:     Mother with CAD, HTN, and DM2     Physical Exam:  GEN:  Pleasant  Elderly well developed  67 y.o. African American male  examined  and in no acute distress; cooperative with exam Filed Vitals:   11/16/12 2320 11/17/12 0107  BP: 149/64 140/61  Pulse: 97 91  Temp: 99.6 F (37.6 C) 100.3 F (37.9 C)  TempSrc: Oral Oral  Resp: 16 18  Height: 5\' 10"  (1.778 m)   Weight: 80.287 kg (177 lb)   SpO2: 93% 95%   Blood pressure 140/61, pulse 91, temperature 100.3 F (37.9 C), temperature source Oral, resp. rate 18, height 5\' 10"  (1.778 m), weight 80.287 kg (177 lb), SpO2 95.00%. PSYCH: He is alert and oriented x4; does not appear anxious does not appear depressed; affect is normal HEENT: Normocephalic and Atraumatic, Mucous membranes pink; PERRLA;  EOM intact; Fundi:  Benign;  No scleral icterus, Nares: Patent, Oropharynx: Clear, Fair Dentition, Neck:  FROM, no cervical lymphadenopathy nor thyromegaly or carotid bruit; no JVD; Breasts:: Not examined CHEST WALL: No tenderness CHEST: Normal respiration, clear to auscultation bilaterally HEART: Regular rate and rhythm; no murmurs rubs or gallops BACK: No kyphosis or scoliosis; no CVA tenderness ABDOMEN: Positive Bowel Sounds, Obese, soft non-tender; no masses. Rectal Exam: Not done EXTREMITIES: Left BKA,  Edematous Right Lower  Extremity ,  no cyanosis,  No clubbing ;  + Wet ulcer between Right Great Toe and MTP area. Genitalia: not examined PULSES: 2+ and symmetric SKIN: Normal hydration no rash or ulceration CNS: Cranial nerves 2-12 grossly intact no focal neurologic deficit    Labs on Admission:  Basic Metabolic Panel:  Recent Labs Lab 11/16/12 2330  NA 131*  K 4.0  CL 100  CO2 20  GLUCOSE 167*  BUN 23  CREATININE 1.58*  CALCIUM 8.2*   Liver Function Tests:  Recent Labs Lab 11/16/12 2330  AST 35  ALT 35  ALKPHOS 97  BILITOT 0.3  PROT 7.2  ALBUMIN 2.1*   No results found for this basename: LIPASE, AMYLASE,  in the last 168 hours No results found for this basename: AMMONIA,  in the last 168 hours CBC:  Recent Labs Lab 11/16/12 2330  WBC 13.8*  NEUTROABS 9.5*  HGB 6.3*  HCT 19.1*  MCV 86.4  PLT 390   Cardiac Enzymes: No results found for this basename: CKTOTAL, CKMB, CKMBINDEX, TROPONINI,  in the last 168 hours  BNP (last 3 results)  Recent Labs  11/17/12 0016  PROBNP 4406.0*   CBG:  Recent Labs Lab 11/16/12 2323  GLUCAP 154*    Radiological Exams on Admission: Dg Chest 2 View  11/17/2012   CLINICAL DATA:  Fever, upper back pain.  EXAM: CHEST  2 VIEW  COMPARISON:  10/10/2011  FINDINGS: Patchy bilateral airspace opacities are noted, most pronounced in the upper lobes. Findings concerning for multifocal pneumonia. Heart is borderline in  size. Small bilateral pleural effusions. No acute bony abnormality.  IMPRESSION: Patchy multifocal bilateral airspace disease concerning for multifocal pneumonia. Small bilateral effusions.   Electronically Signed   By: Rolm Baptise   On: 11/17/2012 01:51     EKG: Independently reviewed.    Assessment/Plan Principal Problem:   Sepsis Active Problems:   HCAP (healthcare-associated pneumonia)   AKI (acute kidney injury)   Altered mental status   DM2 (diabetes mellitus, type 2)   Right foot ulcer   Hypertension   Anemia   1.   Sepsis/HCAP-  Blood Cultures done, IV Vancomycin and Primaxin ordered and admitted to [place].      IVFs for fluid resuscitation.  Albuterol Nebs, and O2 PRN.       2.   AKI-  monitor BUN/Cr, IVFs for rehydration.    3.   Altered Mental Status Due to #1.    4.   DM2-  Continue   And Add SSI coverage,  Check HBA1C.    5.   HTN-   Continue   And add IV Hydralazine PRn.     6    Right Foot Wounds-  Wound Care evaluation for care of newest wound between Right Great toe.  May need    evaluation by Dr [name] who performed I+D on lateral foot ulcer of right foot  2 weeks ago.     7.   Anemia- transfuse 1 unit PRBCs,  And monitor H/Hs.  Heme test Stools.     8.   DVT prophylaxis with Lovenox.     9.  Hx of Seizures - on Phenytoin rx, check level.     Code Status:      FULL CODE Family Communication:    Wife and Daughter at Bedside Disposition Plan:      Inpatient    Time spent:  [facility] Pager [phone]  If 7PM-7AM, please contact night-coverage [email or web address] Password [facility] 11/17/2012, 3:41 AM

## 2012-11-17 NOTE — Consult Note (Signed)
ORTHOPAEDIC CONSULTATION  REQUESTING PHYSICIAN: Theressa Millard, MD  Chief Complaint: Right great toe wound, pneumonia  HPI: [name]. is a 67 y.o. male who was admitted last night for pneumonia and concerns for sepsis.  He is a patient of Dr. Jess Barters who operated on him 2 weeks ago on his right foot.  He currently has a right great toe ulcer that is draining serosanguinous fluid.  The patient states his foot feels great and has no problems with it.  He has had fevers.    Past Medical History  Diagnosis Date  . Diabetes mellitus   . Hypertension   . Seizures   . Stroke   . Peripheral vascular disease   . CKD (chronic kidney disease)     Stage II (per 06/2010 notes)  . Neuromuscular disorder   . Pneumonia   . Anemia    Past Surgical History  Procedure Laterality Date  . Colon surgery      partial colectomy  . Eye surgery      bilat cataract surgery  . Below knee leg amputation      L  . Toe amputation      R two  . Amputation  10/10/2011    Procedure: AMPUTATION DIGIT;  Surgeon: Newt Minion, MD;  Location: [facility];  Service: Orthopedics;  Laterality: Right;  Right Foot 4th Toe Amputation  . Orif toe fracture Right 10/09/2012    Procedure: OPEN REDUCTION INTERNAL FIXATION (ORIF) METATARSAL (TOE) FRACTURE;  Surgeon: Newt Minion, MD;  Location: [facility];  Service: Orthopedics;  Laterality: Right;  Right Foot Base 1st Metatarsal and Medial Cuneoform Excision, Internal Fixation, Antibiotic Beads  . I&d extremity Right 10/28/2012    Procedure: IRRIGATION AND DEBRIDEMENT EXTREMITY;  Surgeon: Newt Minion, MD;  Location: [facility];  Service: Orthopedics;  Laterality: Right;  Irrigation and Debridement Right Foot, Remove Deep Hardware, Place Antibiotic Beads  . Vascular surgery     History   Social History  . Marital Status: Married    Spouse Name: N/A    Number of Children: N/A  . Years of Education: N/A   Social History Main Topics  . Smoking status: Never Smoker   .  Smokeless tobacco: Never Used  . Alcohol Use: 0.0 oz/week     Comment: seldom  . Drug Use: No  . Sexual Activity: None   Other Topics Concern  . None   Social History Narrative  . None   History reviewed. No pertinent family history. Allergies  Allergen Reactions  . Codeine Anaphylaxis  . Penicillins Anaphylaxis   Prior to Admission medications   Medication Sig Start Date End Date Taking? Authorizing Provider  aspirin 325 MG tablet Take 325 mg by mouth daily.   Yes Historical Provider, MD  atorvastatin (LIPITOR) 20 MG tablet Take 20 mg by mouth daily.   Yes Historical Provider, MD  ciprofloxacin (CIPRO) 250 MG tablet Take 250 mg by mouth 2 (two) times daily. 11/11/12 12/09/12 Yes Historical Provider, MD  guaiFENesin-dextromethorphan (ROBITUSSIN DM) 100-10 MG/5ML syrup Take 15 mLs by mouth every 4 (four) hours as needed for cough. 11/01/12  Yes Ripudeep Krystal Eaton, MD  insulin aspart protamine- aspart (NOVOLOG MIX 70/30) (70-30) 100 UNIT/ML injection Inject 6-15 Units into the skin See admin instructions. Takes 15 units in the morning, and 6 units in the evening 11/01/12  Yes Ripudeep K Rai, MD  insulin glargine (LANTUS) 100 UNIT/ML injection Inject 24 Units into the skin at bedtime.  Yes Historical Provider, MD  meloxicam (MOBIC) 7.5 MG tablet Take 7.5 mg by mouth daily.   Yes Historical Provider, MD  metoprolol (LOPRESSOR) 50 MG tablet Take 25 mg by mouth 2 (two) times daily.   Yes Historical Provider, MD  phenytoin (DILANTIN) 200 MG ER capsule Take 1 capsule (200 mg total) by mouth 2 (two) times daily. 11/01/12  Yes Ripudeep Krystal Eaton, MD  polyethylene glycol (MIRALAX / GLYCOLAX) packet Take 17 g by mouth daily as needed (constipation). 11/01/12  Yes Ripudeep Krystal Eaton, MD  senna-docusate (SENOKOT-S) 8.6-50 MG per tablet Take 1 tablet by mouth daily. 11/01/12  Yes Ripudeep K Rai, MD  sodium chloride 0.9 % injection 10-40 mL by Intracatheter route as needed (flush). 11/01/12  Yes Ripudeep Krystal Eaton, MD   tamsulosin (FLOMAX) 0.4 MG CAPS capsule Take 1 capsule (0.4 mg total) by mouth daily after supper. 11/01/12  Yes Ripudeep Krystal Eaton, MD  timolol (BETIMOL) 0.5 % ophthalmic solution Place 1 drop into both eyes every morning.    Yes Historical Provider, MD  traMADol (ULTRAM) 50 MG tablet Take 1 tablet (50 mg total) by mouth every 6 (six) hours as needed for pain. Maximum dose= 8 tablets per day 10/10/12  Yes Newt Minion, MD  Travoprost, BAK Free, ([name]) 0.004 % SOLN ophthalmic solution Place 1 drop into both eyes at bedtime.   Yes Historical Provider, MD  VANCOMYCIN HCL IV Inject 1,000 mg into the vein every 12 (twelve) hours. 11/11/12 12/09/12 Yes Historical Provider, MD   Dg Chest 2 View  11/17/2012   CLINICAL DATA:  Fever, upper back pain.  EXAM: CHEST  2 VIEW  COMPARISON:  10/10/2011  FINDINGS: Patchy bilateral airspace opacities are noted, most pronounced in the upper lobes. Findings concerning for multifocal pneumonia. Heart is borderline in size. Small bilateral pleural effusions. No acute bony abnormality.  IMPRESSION: Patchy multifocal bilateral airspace disease concerning for multifocal pneumonia. Small bilateral effusions.   Electronically Signed   By: Rolm Baptise   On: 11/17/2012 01:51    Positive ROS: All other systems have been reviewed and were otherwise negative with the exception of those mentioned in the HPI and as above.  Physical Exam: General: Alert, no acute distress Cardiovascular: No pedal edema Respiratory: No cyanosis, no use of accessory musculature GI: No organomegaly, abdomen is soft and non-tender Skin: see below Neurologic: not intact due to peripheral neuropathy Psychiatric: Patient is competent for consent with normal mood and affect Lymphatic: No axillary or cervical lymphadenopathy  MUSCULOSKELETAL:  Right foot: Deep wound at the base of the great toe and 1st webspace that probes to bone No gross purulence. + serosanguinous drainage S/p multiple toe  amputations Incisions are benign appearing with scant drainage No cellulitis, fluctuance Sensory neuropathy   Assessment: 1. Right great toe ulcer, Wagner stage 3  Plan: 1. ABIs reviewed 2. Would obtain MRI to r/o osteo, abscess 3. Would recommend obtaining lower extremity arterial duplex 4. Obtain prealbumin, albumin, protein to check nutrition status and CRP and ESR 5. abx per primary team 6. Would not recommend culturing the wound due to high likelihood of contamination 7. NWB for now 8. Will discuss case with Dr. Freddi Che, Mosetta Pigeon, MD [phone]   11/17/2012 2:56 PM

## 2012-11-17 NOTE — Consult Note (Signed)
WOC consult Note Reason for Consult:Wound at base of right great toe (RGT). Patient had orthopedic surgery intervention (amputation) approximately two weeks ago by Dr. [name].  Recommend consulting Dr. [name] regarding these new findings. Wound type: Moisture associated skin damage (MASD) vs trauma vs arterial insufficiency Pressure Ulcer POA: No Measurement: 4.5cm x .4cm linear opening along base of RGT with defect at medial aspect measuring 1cm x .4cm x .8cm deep.  Yellow base at this deepest point; red base otherwise. Wound bed: As above Drainage (amount, consistency, odor) Crusted (dried) serous exudate noted in wound bed; this partially is removed with gently cleansing prior to assessment. Patient complains of pain when deepest aspect of wound is explored gently with a sterile cotton alginate swabstick. Periwound: intact with evidence of healing surgical sites. Dressing procedure/placement/frequency:I will implement a conservative plan of care at this time and will defer to Dr. Jess Barters expertise in the event that referring MD elects to consult.  A silver hydrofiber dressing strip (Aquacel Ag+) is suggested to absorb moisture and donate its antimicrobial properties to the open wound.  At this time, daily dressings are suggested. Note:  Patient is unsure where his alpaca sock (provided to him by Dr. [name] previously) is and is upset at not knowing.  A brief inspection of patient's immediate surroundings conducted and no sock is found.  Person Nursing Team will not follow, but will remain available.  Please re-consult if needed. Thanks, Maudie Flakes, MSN, RN, [facility], [place] [phone])

## 2012-11-17 NOTE — ED Notes (Signed)
Dr. Jenkins at bedside. 

## 2012-11-17 NOTE — ED Provider Notes (Signed)
CSN: [id number]     Arrival date & time 11/16/12  2316 History   First MD Initiated Contact with Patient 11/16/12 2358     Chief Complaint  Patient presents with  . Fever  . Back Pain   (Consider location/radiation/quality/duration/timing/severity/associated sxs/prior Treatment) HPI [name] is a very pleasant diabetic man with multiple comorbidities who was recently released from this hospital after admission for severe sepsis secondary to urinary tract infection at all as a gangrenous foot. He was discharged with a PIC line in the right [facility] through which he is receiving Vancomycin 1gram q 24h (last dose 1800h). In addition, he is taking Cipro 250 bid for urinary tract infection. His family reports that he has been compliant with his medications.  The patient's family brought him to the emergency department because he began acting "differently". He seemed confused at times and somewhat disoriented. In addition, his daughter noted an oral temperature of 100.1 at home. This constellation of symptoms similar to those which preceded his admission with severe sepsis secondary to gangrenous foot and urinary tract infection earlier in the month.  The patient says he feels poorly. However, he says he does not believe that he needs to be in the hospital and he did not know why he is here. He is noted to be cachectic but he denies shortness of breath. He denies cough. He denies chest pain.  The patient says he has not had abdominal pain, nausea, vomiting or diarrhea. His right foot pain has been relatively well controlled. He denies dysuria. The family reports that his by mouth intake has been diminished over the past 24 hours. They first noticed the above symptoms approximately that time. The above symptoms have worsened, according to him   Past Medical History  Diagnosis Date  . Diabetes mellitus   . Hypertension   . Seizures   . Stroke   . Peripheral vascular disease   . CKD (chronic kidney  disease)     Stage II (per 06/2010 notes)  . Neuromuscular disorder    Past Surgical History  Procedure Laterality Date  . Colon surgery      partial colectomy  . Eye surgery      bilat cataract surgery  . Below knee leg amputation      L  . Toe amputation      R two  . Amputation  10/10/2011    Procedure: AMPUTATION DIGIT;  Surgeon: Newt Minion, MD;  Location: [place];  Service: Orthopedics;  Laterality: Right;  Right Foot 4th Toe Amputation  . Orif toe fracture Right 10/09/2012    Procedure: OPEN REDUCTION INTERNAL FIXATION (ORIF) METATARSAL (TOE) FRACTURE;  Surgeon: Newt Minion, MD;  Location: [place];  Service: Orthopedics;  Laterality: Right;  Right Foot Base 1st Metatarsal and Medial Cuneoform Excision, Internal Fixation, Antibiotic Beads  . I&d extremity Right 10/28/2012    Procedure: IRRIGATION AND DEBRIDEMENT EXTREMITY;  Surgeon: Newt Minion, MD;  Location: [place];  Service: Orthopedics;  Laterality: Right;  Irrigation and Debridement Right Foot, Remove Deep Hardware, Place Antibiotic Beads   No family history on file. History  Substance Use Topics  . Smoking status: Never Smoker   . Smokeless tobacco: Never Used  . Alcohol Use: 0.6 oz/week    1 Glasses of wine per week     Comment: seldom    Review of Systems Gen as per history of present illness, otherwise negative Eyes: no discharge or drainage, no occular pain or visual  changes Nose: no epistaxis or rhinorrhea Mouth: no dental pain, no sore throat Neck: no neck pain Lungs: no SOB, cough, wheezing CV: no chest pain, palpitations, dependent edema or orthopnea Abd: no abdominal pain, nausea, vomiting GU: no dysuria or gross hematuria MSK: As per history of present illness, otherwise negative Neuro: Chronic diabetic neuropathy, otherwise negative Skin: no rash Psyche: As per history of present illness, otherwise negative  Allergies  Codeine and Penicillins  Home Medications   Current Outpatient Rx  Name   Route  Sig  Dispense  Refill  . aspirin 325 MG tablet   Oral   Take 325 mg by mouth daily.         [name] atorvastatin (LIPITOR) 20 MG tablet   Oral   Take 20 mg by mouth daily.         . ciprofloxacin (CIPRO) 500 MG tablet   Oral   Take 1 tablet (500 mg total) by mouth 2 (two) times daily. X 4 weeks   60 tablet   0   . guaiFENesin-dextromethorphan (ROBITUSSIN DM) 100-10 MG/5ML syrup   Oral   Take 15 mL by mouth every 4 (four) hours as needed for cough.   240 mL   0   . insulin aspart protamine- aspart (NOVOLOG MIX 70/30) (70-30) 100 UNIT/ML injection   Subcutaneous   Inject 0.25 mL (25 Units total) into the skin 2 (two) times daily with a meal.   10 mL   12   . metoprolol (LOPRESSOR) 50 MG tablet   Oral   Take 25 mg by mouth 2 (two) times daily.         . phenytoin (DILANTIN) 200 MG ER capsule   Oral   Take 1 capsule (200 mg total) by mouth 2 (two) times daily.   60 capsule   3   . polyethylene glycol (MIRALAX / GLYCOLAX) packet   Oral   Take 17 g by mouth daily as needed (constipation).   14 each   0   . senna-docusate (SENOKOT-S) 8.6-50 MG per tablet   Oral   Take 1 tablet by mouth 2 (two) times daily.   60 tablet   3   . silver sulfADIAZINE (SILVADENE) 1 % cream   Topical   Apply topically 3 (three) times a week. Apply to right foot 3 times/ week   50 g   5   . sodium chloride 0.9 % injection   Intracatheter   10-40 mL by Intracatheter route as needed (flush).   10 mL   20   . tamsulosin (FLOMAX) 0.4 MG CAPS capsule   Oral   Take 1 capsule (0.4 mg total) by mouth daily after supper.   30 capsule   5   . timolol (BETIMOL) 0.5 % ophthalmic solution   Both Eyes   Place 1 drop into both eyes every morning.          . traMADol (ULTRAM) 50 MG tablet   Oral   Take 1 tablet (50 mg total) by mouth every 6 (six) hours as needed for pain. Maximum dose= 8 tablets per day   60 tablet   0   . Travoprost, BAK Free, ([name]) 0.004 % SOLN  ophthalmic solution   Both Eyes   Place 1 drop into both eyes at bedtime.         . Vancomycin (VANCOCIN) 750 MG/150ML SOLN   Intravenous   Inject 150 mL (750 mg total) into the vein every 12 (twelve)  hours. X 4 weeks   150 mL   30    BP 149/64  Pulse 97  Temp(Src) 99.6 F (37.6 C) (Oral)  Resp 16  Ht 5\' 10"  (1.778 m)  Wt 177 lb (80.287 kg)  BMI 25.4 kg/m2  SpO2 93% Physical Exam Gen: The patient is acutely ill appearing. Head: NCAT Eyes: PERL, EOMI Nose: no epistaixis or rhinorrhea Mouth/throat: mucosa is mildly dehydrated appearing and pink posterior oropharynx appears normal Neck: supple, no stridor, no adenopathy Lungs: Respirations 32-36 per minute, bibasilar rales are appreciated left greater than right Cardiovascular: Rapid rate and regular rhythm, grade 2/6 systolic murmur is appreciated, symmetric radial pulses bilaterally Abd: soft, notender, nondistended Back: no ttp, no cva ttp, mild kyphosis is noted Skin: no rashese, wnl, warm Ext: Left leg is status post below the knee amputation, right lower extremity notable for operative deformities of the right foot, there is a line of sutures intact over the medial aspect of the right foot, no significant erythema, the patient has tenderness to palpation in this region, no fluctuance or duration is appreciated. Neuro: CN ii-xii grossly intact, no focal motor deficits, speech normal Psyche; flat affect,  calm and cooperative.   ED Course  Procedures (including critical care time) Labs Review  Results for orders placed during the hospital encounter of 11/16/12 (from the past 24 hour(s))  GLUCOSE, CAPILLARY     Status: Abnormal   Collection Time    11/16/12 11:23 PM      Result Value Range   Glucose-Capillary 154 (*) 70 - 99 mg/dL  CBC WITH DIFFERENTIAL     Status: Abnormal   Collection Time    11/16/12 11:30 PM      Result Value Range   WBC 13.8 (*) 4.0 - 10.5 K/uL   RBC 2.21 (*) 4.22 - 5.81 MIL/uL   Hemoglobin  6.3 (*) 13.0 - 17.0 g/dL   HCT 19.1 (*) 39.0 - 52.0 %   MCV 86.4  78.0 - 100.0 fL   MCH 28.5  26.0 - 34.0 pg   MCHC 33.0  30.0 - 36.0 g/dL   RDW 16.4 (*) 11.5 - 15.5 %   Platelets 390  150 - 400 K/uL   Neutrophils Relative % 69  43 - 77 %   Neutro Abs 9.5 (*) 1.7 - 7.7 K/uL   Lymphocytes Relative 8 (*) 12 - 46 %   Lymphs Abs 1.1  0.7 - 4.0 K/uL   Monocytes Relative 12  3 - 12 %   Monocytes Absolute 1.7 (*) 0.1 - 1.0 K/uL   Eosinophils Relative 10 (*) 0 - 5 %   Eosinophils Absolute 1.4 (*) 0.0 - 0.7 K/uL   Basophils Relative 0  0 - 1 %   Basophils Absolute 0.1  0.0 - 0.1 K/uL  COMPREHENSIVE METABOLIC PANEL     Status: Abnormal   Collection Time    11/16/12 11:30 PM      Result Value Range   Sodium 131 (*) 135 - 145 mEq/L   Potassium 4.0  3.5 - 5.1 mEq/L   Chloride 100  96 - 112 mEq/L   CO2 20  19 - 32 mEq/L   Glucose, Bld 167 (*) 70 - 99 mg/dL   BUN 23  6 - 23 mg/dL   Creatinine, Ser 1.58 (*) 0.50 - 1.35 mg/dL   Calcium 8.2 (*) 8.4 - 10.5 mg/dL   Total Protein 7.2  6.0 - 8.3 g/dL   Albumin 2.1 (*) 3.5 - 5.2  g/dL   AST 35  0 - 37 U/L   ALT 35  0 - 53 U/L   Alkaline Phosphatase 97  39 - 117 U/L   Total Bilirubin 0.3  0.3 - 1.2 mg/dL   GFR calc non Af Amer 44 (*) >90 mL/min   GFR calc Af Amer 51 (*) >90 mL/min  RETICULOCYTES     Status: Abnormal   Collection Time    11/17/12 12:16 AM      Result Value Range   Retic Ct Pct 8.5 (*) 0.4 - 3.1 %   RBC. 2.12 (*) 4.22 - 5.81 MIL/uL   Retic Count, Manual 180.2  19.0 - 186.0 K/uL  PRO B NATRIURETIC PEPTIDE     Status: Abnormal   Collection Time    11/17/12 12:16 AM      Result Value Range   Pro B Natriuretic peptide (BNP) 4406.0 (*) 0 - 125 pg/mL  POCT I-STAT TROPONIN I     Status: None   Collection Time    11/17/12  1:04 AM      Result Value Range   Troponin i, poc 0.07  0.00 - 0.08 ng/mL   Comment 3           CG4 I-STAT (LACTIC ACID)     Status: None   Collection Time    11/17/12  1:06 AM      Result Value Range    Lactic Acid, Venous 1.09  0.5 - 2.2 mmol/L  POCT I-STAT 3, BLOOD GAS (G3+)     Status: Abnormal   Collection Time    11/17/12  2:15 AM      Result Value Range   pH, Arterial 7.394  7.350 - 7.450   pCO2 arterial 31.6 (*) 35.0 - 45.0 mmHg   pO2, Arterial 59.0 (*) 80.0 - 100.0 mmHg   Bicarbonate 19.1 (*) 20.0 - 24.0 mEq/L   TCO2 20  0 - 100 mmol/L   O2 Saturation 89.0     Acid-base deficit 5.0 (*) 0.0 - 2.0 mmol/L   Patient temperature 100.3 F     Collection site RADIAL, ALLEN'S TEST ACCEPTABLE     Drawn by Operator     Sample type ARTERIAL    URINALYSIS, ROUTINE W REFLEX MICROSCOPIC     Status: Abnormal   Collection Time    11/17/12  2:27 AM      Result Value Range   Color, Urine ORANGE (*) YELLOW   APPearance CLOUDY (*) CLEAR   Specific Gravity, Urine 1.009  1.005 - 1.030   pH 5.0  5.0 - 8.0   Glucose, UA NEGATIVE  NEGATIVE mg/dL   Hgb urine dipstick NEGATIVE  NEGATIVE   Bilirubin Urine NEGATIVE  NEGATIVE   Ketones, ur NEGATIVE  NEGATIVE mg/dL   Protein, ur 30 (*) NEGATIVE mg/dL   Urobilinogen, UA 1.0  0.0 - 1.0 mg/dL   Nitrite POSITIVE (*) NEGATIVE   Leukocytes, UA NEGATIVE  NEGATIVE  URINE MICROSCOPIC-ADD ON     Status: Abnormal   Collection Time    11/17/12  2:27 AM      Result Value Range   WBC, UA 0-2  <3 WBC/hpf   RBC / HPF 0-2  <3 RBC/hpf   Bacteria, UA FEW (*) RARE  TYPE AND SCREEN     Status: None   Collection Time    11/17/12  2:30 AM      Result Value Range   ABO/RH(D) O POS     Antibody Screen  NEG     Sample Expiration 11/20/2012     Unit Number [id number]     Blood Component Type RED CELLS,LR     Unit division 00     Status of Unit ISSUED     Transfusion Status OK TO TRANSFUSE     Crossmatch Result Compatible     Unit Number [id number]     Blood Component Type RED CELLS,LR     Unit division 00     Status of Unit ISSUED     Transfusion Status OK TO TRANSFUSE     Crossmatch Result Compatible     Unit Number [id number]     Blood Component  Type RED CELLS,LR     Unit division 00     Status of Unit ALLOCATED     Transfusion Status OK TO TRANSFUSE     Crossmatch Result Compatible    PREPARE RBC (CROSSMATCH)     Status: None   Collection Time    11/17/12  2:30 AM      Result Value Range   Order Confirmation ORDER PROCESSED BY BLOOD BANK    GLUCOSE, CAPILLARY     Status: Abnormal   Collection Time    11/17/12  4:48 AM      Result Value Range   Glucose-Capillary 108 (*) 70 - 99 mg/dL  BASIC METABOLIC PANEL     Status: Abnormal   Collection Time    11/17/12  5:00 AM      Result Value Range   Sodium 134 (*) 135 - 145 mEq/L   Potassium 3.7  3.5 - 5.1 mEq/L   Chloride 104  96 - 112 mEq/L   CO2 20  19 - 32 mEq/L   Glucose, Bld 104 (*) 70 - 99 mg/dL   BUN 22  6 - 23 mg/dL   Creatinine, Ser 1.58 (*) 0.50 - 1.35 mg/dL   Calcium 7.8 (*) 8.4 - 10.5 mg/dL   GFR calc non Af Amer 44 (*) >90 mL/min   GFR calc Af Amer 51 (*) >90 mL/min  CBC     Status: Abnormal   Collection Time    11/17/12  5:00 AM      Result Value Range   WBC 13.0 (*) 4.0 - 10.5 K/uL   RBC 2.00 (*) 4.22 - 5.81 MIL/uL   Hemoglobin 5.7 (*) 13.0 - 17.0 g/dL   HCT 17.2 (*) 39.0 - 52.0 %   MCV 86.0  78.0 - 100.0 fL   MCH 28.5  26.0 - 34.0 pg   MCHC 33.1  30.0 - 36.0 g/dL   RDW 16.3 (*) 11.5 - 15.5 %   Platelets 359  150 - 400 K/uL  HEMOGLOBIN A1C     Status: Abnormal   Collection Time    11/17/12  5:00 AM      Result Value Range   Hemoglobin A1C 5.7 (*) <5.7 %   Mean Plasma Glucose 117 (*) <117 mg/dL  PHENYTOIN LEVEL, TOTAL     Status: Abnormal   Collection Time    11/17/12  5:00 AM      Result Value Range   Phenytoin Lvl 3.9 (*) 10.0 - 20.0 ug/mL  MRSA PCR SCREENING     Status: None   Collection Time    11/17/12  5:00 AM      Result Value Range   MRSA by PCR NEGATIVE  NEGATIVE  GLUCOSE, CAPILLARY     Status: None   Collection Time    11/17/12  8:24 AM      Result Value Range   Glucose-Capillary 76  70 - 99 mg/dL  HEMOGLOBIN AND HEMATOCRIT,  BLOOD     Status: Abnormal   Collection Time    11/17/12 10:00 AM      Result Value Range   Hemoglobin 7.0 (*) 13.0 - 17.0 g/dL   HCT 20.8 (*) 39.0 - 52.0 %  IRON AND TIBC     Status: Abnormal   Collection Time    11/17/12 10:00 AM      Result Value Range   Iron 38 (*) 42 - 135 ug/dL   TIBC 145 (*) 215 - 435 ug/dL   Saturation Ratios 26  20 - 55 %   UIBC 107 (*) 125 - 400 ug/dL  FERRITIN     Status: Abnormal   Collection Time    11/17/12 10:00 AM      Result Value Range   Ferritin 677 (*) 22 - 322 ng/mL  GLUCOSE, CAPILLARY     Status: Abnormal   Collection Time    11/17/12 12:00 PM      Result Value Range   Glucose-Capillary 125 (*) 70 - 99 mg/dL  PREPARE RBC (CROSSMATCH)     Status: None   Collection Time    11/17/12  2:10 PM      Result Value Range   Order Confirmation ORDER PROCESSED BY BLOOD BANK    SEDIMENTATION RATE     Status: Abnormal   Collection Time    11/17/12  2:40 PM      Result Value Range   Sed Rate 118 (*) 0 - 16 mm/hr  GLUCOSE, CAPILLARY     Status: Abnormal   Collection Time    11/17/12  5:14 PM      Result Value Range   Glucose-Capillary 174 (*) 70 - 99 mg/dL  GLUCOSE, CAPILLARY     Status: Abnormal   Collection Time    11/17/12  8:24 PM      Result Value Range   Glucose-Capillary 247 (*) 70 - 99 mg/dL       DG Chest 2 View (Final result)  Result time: 11/17/12 01:51:58    Final result by Rad Results In Interface (11/17/12 01:51:58)    Narrative:   CLINICAL DATA: Fever, upper back pain.  EXAM: CHEST 2 VIEW  COMPARISON: 10/10/2011  FINDINGS: Patchy bilateral airspace opacities are noted, most pronounced in the upper lobes. Findings concerning for multifocal pneumonia. Heart is borderline in size. Small bilateral pleural effusions. No acute bony abnormality.  IMPRESSION: Patchy multifocal bilateral airspace disease concerning for multifocal pneumonia. Small bilateral effusions.   Electronically Signed By: Rolm Baptise On:  11/17/2012 01:51    EKG: nsr, no acute ischemic changes, normal intervals, normal axis, normal qrs complex  MDM  The patient is septic appearing and differential diagnosis includes recurrent urinary tract infection, gangrenous right foot,, wound infection, pneumonia. The patient does not seem to have any abdominal complaints or symptoms.  Emergency workup is notable for pneumonia bilaterally. This accounts for the patient's respiratory distress. He is also noted to be severely anemic with a hemoglobin of 6. Of note, the patient was found to be anemic requiring blood transfusion during his admission previously in the month. His anemia it does not appear to have been completely worked up during the admission as the focus was on treating sepsis  In the emergency department, we are resuscitating with normal saline and treating empirically with Primaxin. Patient received a dose  of vancomycin at home shortly prior to his arrival to the emergency department. Blood cultures obtained and sent. Anemia studies have been ordered and the patient will be transfused one unit of packed blood cells.  The patient demonstrated improvement during his emergency department course and was ultimately admitted to the step down unit by Dr. Arnoldo Morale.  CRITICAL CARE Performed by: Elyn Peers   Total critical care time: 63m  Critical care time was exclusive of separately billable procedures and treating other patients.  Critical care was necessary to treat or prevent imminent or life-threatening deterioration.  Critical care was time spent personally by me on the following activities: development of treatment plan with patient and/or surrogate as well as nursing, discussions with consultants, evaluation of patient's response to treatment, examination of patient, obtaining history from patient or surrogate, ordering and performing treatments and interventions, ordering and review of laboratory studies, ordering and review  of radiographic studies, pulse oximetry and re-evaluation of patient's condition.     Elyn Peers, MD 11/17/12 2232

## 2012-11-17 NOTE — Progress Notes (Addendum)
[facility] PROGRESS NOTE  [name]. [id number] DOB: January 29, 1946 DOA: 11/16/2012 PCP: [name], MD  Assessment/Plan: 67 y.o. male with PMH of DM, HTN, h/o CVA, PAD anemia, seizure, CKD, s/p L BKA who was brought to the ED with complaints of confusion Coughing and fever and chills over the past 2 days. He was inpatient 2 weeks ago for an I+D of a diabetic foot ulcer on the right lateral foot that was performed by Dr [name]. He was discharge to home on 08/15. He was found on evaluation in the ED to have pneumonia, and anemia with a hb =6.   1. Sepsis/HCAP- CXR: Patchy multifocal bilateral airspace disease ;  -Blood Cultures done, IV Vancomycin and Primaxin; Albuterol Nebs, and O2 PRN.  -d/w pharmacy due to anaphylaxis to [facility], will cont primaxin with close monitoring; h/o seizure; cont Vanc; clinically stable; cont monitoring  2. CKD III; likely DM nephropathy; d/c NSAIDS;  monitor  3. Altered Mental Status Due to #1. Non focal exam; Monitor  4. DM2; Pt was recently started lantus  24 units; cont SSI coverage, Check HBA1C; pen d 5. HTN- Continue metoprolol 25 bid,  And add IV Hydralazine PRn.  6 Right Foot Wounds- Wound Care evaluation for care of newest wound between Right Great toe. May need evaluation by Dr [name] who performed I+D on lateral foot ulcer of right foot 2 weeks ago.  -d/w Dr. Erlinda Hong covering for Dr. [name] who kindly agreed to see the patient  7. Anemia- transfused 1 unit PRBCs, And monitor H/Hs. Heme test Stools. Iron panel;  -repeat Hg-5.8--->7.0; TF 2 mote units of PRBc, lasix in between  8. DVT prophylaxis with Lovenox.  9. Hx of Seizures - on Phenytoin rx, check level. D/c tramadol;   Code Status: full  Family Communication:  Yoshihiro, [place] Spouse [phone] updated  Disposition Plan: pend evall;    Consultants:    Procedures:    Antibiotics: Cefepime started on 8/31;  Patient was on IV vanc 2 weeks for foot  infection  HPI/Subjective: Patient mild confused, no fever, no chest pain, no SOB butuhas cough , no acute bleeding   Objective: Filed Vitals:   11/17/12 0700  BP: 134/66  Pulse: 94  Temp: 98.9 F (37.2 C)  Resp: 25    Intake/Output Summary (Last 24 hours) at 11/17/12 0856 Last data filed at 11/17/12 0846  Gross per 24 hour  Intake  43.75 ml  Output    475 ml  Net -431.25 ml   Filed Weights   11/16/12 2320  Weight: 80.287 kg (177 lb)    Exam:   General:  Awake   Cardiovascular: S1, S2 RRR  Respiratory: LL rhonchi   Abdomen: soft, NT, ND +BS   Musculoskeletal: R LE post op wound, edema, no discharge    Data Reviewed: Basic Metabolic Panel:  Recent Labs Lab 11/16/12 2330 11/17/12 0500  NA 131* 134*  K 4.0 3.7  CL 100 104  CO2 20 20  GLUCOSE 167* 104*  BUN 23 22  CREATININE 1.58* 1.58*  CALCIUM 8.2* 7.8*   Liver Function Tests:  Recent Labs Lab 11/16/12 2330  AST 35  ALT 35  ALKPHOS 97  BILITOT 0.3  PROT 7.2  ALBUMIN 2.1*   No results found for this basename: LIPASE, AMYLASE,  in the last 168 hours No results found for this basename: AMMONIA,  in the last 168 hours CBC:  Recent Labs Lab 11/16/12 2330 11/17/12 0500  WBC 13.8*  13.0*  NEUTROABS 9.5*  --   HGB 6.3* 5.7*  HCT 19.1* 17.2*  MCV 86.4 86.0  PLT 390 359   Cardiac Enzymes: No results found for this basename: CKTOTAL, CKMB, CKMBINDEX, TROPONINI,  in the last 168 hours BNP (last 3 results)  Recent Labs  11/17/12 0016  PROBNP 4406.0*   CBG:  Recent Labs Lab 11/16/12 2323 11/17/12 0448 11/17/12 0824  GLUCAP 154* 108* 76    No results found for this or any previous visit (from the past 240 hour(s)).   Studies: Dg Chest 2 View  11/17/2012   CLINICAL DATA:  Fever, upper back pain.  EXAM: CHEST  2 VIEW  COMPARISON:  10/10/2011  FINDINGS: Patchy bilateral airspace opacities are noted, most pronounced in the upper lobes. Findings concerning for multifocal pneumonia.  Heart is borderline in size. Small bilateral pleural effusions. No acute bony abnormality.  IMPRESSION: Patchy multifocal bilateral airspace disease concerning for multifocal pneumonia. Small bilateral effusions.   Electronically Signed   By: Rolm Baptise   On: 11/17/2012 01:51    Scheduled Meds: . albuterol  2.5 mg Nebulization Q6H  . aspirin  325 mg Oral Daily  . atorvastatin  20 mg Oral Daily  . enoxaparin (LOVENOX) injection  40 mg Subcutaneous Q24H  . imipenem-cilastatin  500 mg Intravenous Q8H  . insulin aspart  0-9 Units Subcutaneous Q4H  . insulin glargine  24 Units Subcutaneous QHS  . latanoprost  1 drop Both Eyes QHS  . metoprolol  25 mg Oral BID  . phenytoin  200 mg Oral BID  . sodium chloride  1,000 mL Intravenous Once  . tamsulosin  0.4 mg Oral QPC supper  . timolol  1 drop Both Eyes Daily  . vancomycin  750 mg Intravenous Q12H   Continuous Infusions: . sodium chloride Stopped (11/17/12 0530)    Principal Problem:   Sepsis Active Problems:   AKI (acute kidney injury)   Altered mental status   DM2 (diabetes mellitus, type 2)   Right foot ulcer   HCAP (healthcare-associated pneumonia)   Hypertension   Anemia    Time spent: > 30 minutes     Kinnie Feil  [facility] Pager [phone]. If 7PM-7AM, please contact night-coverage at [email or web address], password [facility] 11/17/2012, 8:56 AM  LOS: 1 day

## 2012-11-17 NOTE — ED Notes (Signed)
IV team notified to access PICC line

## 2012-11-17 NOTE — Progress Notes (Signed)
ANTIBIOTIC CONSULT NOTE - INITIAL  Pharmacy Consult for vancomycin Indication: rule out pneumonia  Allergies  Allergen Reactions  . Codeine Anaphylaxis  . Penicillins Anaphylaxis    Patient Measurements: Height: 5\' 10"  (177.8 cm) Weight: 177 lb (80.287 kg) IBW/kg (Calculated) : 73  Vital Signs: Temp: 100.3 F (37.9 C) (08/31 0107) Temp src: Oral (08/31 0107) BP: 140/61 mmHg (08/31 0107) Pulse Rate: 91 (08/31 0107)  Labs:  Recent Labs  11/16/12 2330  WBC 13.8*  HGB 6.3*  PLT 390  CREATININE 1.58*   Estimated Creatinine Clearance: 48.1 ml/min (by C-G formula based on Cr of 1.58).   Microbiology: Recent Results (from the past 720 hour(s))  CULTURE, BLOOD (ROUTINE X 2)     Status: None   Collection Time    10/23/12  4:15 AM      Result Value Range Status   Specimen Description BLOOD LEFT ARM   Final   Special Requests BOTTLES DRAWN AEROBIC AND ANAEROBIC 10CC EACH   Final   Culture  Setup Time     Final   Value: 10/23/2012 10:20     Performed at [facility]   Culture     Final   Value: NO GROWTH 5 DAYS     Performed at [facility]   Report Status 10/28/2012 FINAL   Final  CULTURE, BLOOD (ROUTINE X 2)     Status: None   Collection Time    10/23/12  4:15 AM      Result Value Range Status   Specimen Description BLOOD LEFT HAND   Final   Special Requests BOTTLES DRAWN AEROBIC ONLY 10CC   Final   Culture  Setup Time     Final   Value: 10/23/2012 10:19     Performed at [facility]   Culture     Final   Value: NO GROWTH 5 DAYS     Performed at [facility]   Report Status 10/28/2012 FINAL   Final  MRSA PCR SCREENING     Status: None   Collection Time    10/23/12  5:41 AM      Result Value Range Status   MRSA by PCR NEGATIVE  NEGATIVE Final   Comment:            The GeneXpert MRSA Assay (FDA     approved for NASAL specimens     only), is one component of a     comprehensive MRSA colonization     surveillance program. It is  not     intended to diagnose MRSA     infection nor to guide or     monitor treatment for     MRSA infections.  ANAEROBIC CULTURE     Status: None   Collection Time    10/28/12  5:23 PM      Result Value Range Status   Specimen Description ABSCESS FOOT RIGHT   Final   Special Requests NONE POF VANCOMYCIN CIPRO   Final   Gram Stain     Final   Value: NO WBC SEEN     NO SQUAMOUS EPITHELIAL CELLS SEEN     NO ORGANISMS SEEN     Performed at [facility]   Culture     Final   Value: NO ANAEROBES ISOLATED     Performed at [facility]   Report Status 11/01/2012 FINAL   Final  CULTURE, ROUTINE-ABSCESS     Status: None   Collection Time  10/28/12  5:23 PM      Result Value Range Status   Specimen Description ABSCESS FOOT RIGHT   Final   Special Requests NONE POF VANCOMYCIN CIPRO   Final   Gram Stain     Final   Value: NO WBC SEEN     NO SQUAMOUS EPITHELIAL CELLS SEEN     NO ORGANISMS SEEN     Performed at [facility]   Culture     Final   Value: MODERATE STAPHYLOCOCCUS AUREUS     Note: RIFAMPIN AND GENTAMICIN SHOULD NOT BE USED AS SINGLE DRUGS FOR TREATMENT OF STAPH INFECTIONS.     Performed at [facility]   Report Status 10/31/2012 FINAL   Final   Organism ID, Bacteria STAPHYLOCOCCUS AUREUS   Final    Medical History: Past Medical History  Diagnosis Date  . Diabetes mellitus   . Hypertension   . Seizures   . Stroke   . Peripheral vascular disease   . CKD (chronic kidney disease)     Stage II (per 06/2010 notes)  . Neuromuscular disorder     Medications:   (Not in a hospital admission) Scheduled:  . albuterol  2.5 mg Nebulization Q6H  . enoxaparin (LOVENOX) injection  40 mg Subcutaneous Q24H  . insulin aspart  0-9 Units Subcutaneous Q4H  . vancomycin  750 mg Intravenous Q12H   Infusions:  . sodium chloride    . imipenem-cilastatin 500 mg (11/17/12 0323)  . imipenem-cilastatin    . sodium chloride    . [DISCONTINUED]  vancomycin      Assessment: 67yo male c/o low-grade fever w/ back pain for several days, CXR concerning for PNA, to begin IV ABX.  Goal of Therapy:  Vancomycin trough level 15-20 mcg/ml  Plan:  Pt was recently therapeutic on vancomycin 750mg  IV Q12H; will resume and monitor CBC, Cx, levels prn.  Wynona Neat, PharmD, BCPS  11/17/2012,3:48 AM

## 2012-11-17 NOTE — Progress Notes (Signed)
Spoke with Dr. Daleen Bo regarding PCN allergy with anaphylaxis but noted h/o seizures. Would recommend continuing Primaxin and watch for seizure activity and not using Cefepime due to risk for cross rxn with PCN allergy. Aztreonam would not have predictable pseudomonal coverage.  Nissan Frazzini S. Alford Highland, PharmD, [place] Clinical Staff Pharmacist Pager [phone]

## 2012-11-18 ENCOUNTER — Inpatient Hospital Stay (HOSPITAL_COMMUNITY): Payer: Medicare Other

## 2012-11-18 LAB — GLUCOSE, CAPILLARY: Glucose-Capillary: 254 mg/dL — ABNORMAL HIGH (ref 70–99)

## 2012-11-18 LAB — HEMOGLOBIN AND HEMATOCRIT, BLOOD
HCT: 27.2 % — ABNORMAL LOW (ref 39.0–52.0)
Hemoglobin: 9.5 g/dL — ABNORMAL LOW (ref 13.0–17.0)

## 2012-11-18 LAB — CBC
HCT: 28 % — ABNORMAL LOW (ref 39.0–52.0)
Platelets: 335 10*3/uL (ref 150–400)
RDW: 15.9 % — ABNORMAL HIGH (ref 11.5–15.5)
WBC: 14.2 10*3/uL — ABNORMAL HIGH (ref 4.0–10.5)

## 2012-11-18 LAB — BASIC METABOLIC PANEL
Chloride: 105 mEq/L (ref 96–112)
GFR calc Af Amer: 50 mL/min — ABNORMAL LOW (ref >90)
GFR calc non Af Amer: 43 mL/min — ABNORMAL LOW (ref >90)
Potassium: 4.2 mEq/L (ref 3.5–5.1)
Sodium: 134 mEq/L — ABNORMAL LOW (ref 135–145)

## 2012-11-18 MED ORDER — HYDRALAZINE HCL 20 MG/ML IJ SOLN: 10.0000 mg | Freq: Four times a day (QID) | INTRAMUSCULAR | Status: DC | PRN

## 2012-11-18 MED ORDER — AMLODIPINE BESYLATE 2.5 MG PO TABS
2.5000 mg | ORAL_TABLET | Freq: Every day | ORAL | Status: DC
  Administered 2012-11-18 – 2012-11-20 (×3): 2.5 mg via ORAL
  Filled 2012-11-18 (×3): qty 1

## 2012-11-18 MED ORDER — SILVER SULFADIAZINE 1 % EX CREA
TOPICAL_CREAM | Freq: Every day | CUTANEOUS | Status: DC
  Administered 2012-11-18: 18:00:00 via TOPICAL
  Administered 2012-11-19: 1 via TOPICAL
  Administered 2012-11-20 – 2012-11-25 (×4): via TOPICAL
  Filled 2012-11-18: qty 85

## 2012-11-18 MED ORDER — GADOBENATE DIMEGLUMINE 529 MG/ML IV SOLN
16.0000 mL | Freq: Once | INTRAVENOUS | Status: AC
  Administered 2012-11-18: 16 mL via INTRAVENOUS

## 2012-11-18 NOTE — Progress Notes (Signed)
Patient ID: Devin Rakers., male   DOB: 1945-12-13, 67 y.o.   MRN: [id number] Patient is status post irrigation and debridement for Charcot collapse right foot with infection. Examination of foot at this time the skin wrinkles well there is no signs of any acute infection. The surgical incisions are healing nicely he does have a wound in the first webspace with clear drainage this is most likely due to the deep retained antibiotic beads. MRI scan is consistent with both osteomyelitis and Charcot arthropathy. At this time I feel that the MRI scan is more consistent with the Charcot arthropathy. Will start the patient on Silvadene dressing changes for the first webspace. I will followup in the office in 2 weeks.

## 2012-11-18 NOTE — Progress Notes (Addendum)
[facility] PROGRESS NOTE  [name]. [id number] DOB: Mar 15, 1946 DOA: 11/16/2012 PCP: [name], MD  Assessment/Plan: 67 y.o. male with PMH of DM, HTN, h/o CVA, PAD anemia, seizure, CKD, s/p L BKA who was brought to the ED with complaints of confusion Coughing and fever and chills over the past 2 days. He was inpatient 2 weeks ago for an I+D of a diabetic foot ulcer on the right lateral foot that was performed by Dr [name]. He was discharge to home on 08/15. Patient is admitted with HCAP, sepsis, ?R foot infection, and anemia with a hb =6.   1. Sepsis/HCAP- CXR: Patchy multifocal bilateral airspace disease ; and foot infection  -pend Blood Cultures, cont IV Vancomycin and Primaxin; Albuterol Nebs, and O2 PRN.  -d/w pharmacy due to anaphylaxis to [facility], will cont primaxin with close monitoring due to h/o seizure;  -febrile, 9/1; hemodynamically; close monitoring  2. CKD III; likely DM nephropathy; d/c NSAIDS;  monitor  3. Altered Mental Status Due to #1. Non focal exam; Monitor  4. DM2; Pt was recently started lantus  24 units; cont SSI coverage, Check HBA1C-5.7;  5. HTN- Continue metoprolol 25 bid,  Add amlodipine, titrate meds; And add IV Hydralazine PRn.  6 Right Foot Wounds; Dr [name] who performed I+D on lateral foot ulcer of right foot 2 weeks ago.  -d/w Dr. Erlinda Hong MRI results, he covering for Dr. [name]; he kindly agreed to follow up   MRI: IMPRESSION:  Complex soft tissue infection and extensive osteomyelitis of the left foot. The area of incision and drainage communicates with a much larger serpentine abscess that tracks the length of the foot  With eptic arthritis from the ankle to the 1st MTP joint.  7. Anemia- transfused 1 unit PRBCs, And monitor H/Hs. Heme test Stools. Iron panel;  -repeat Hg-5.8--->7.0; TF 2 mote units of PRBc, lasix in between HG-9.5 8. DVT prophylaxis with Lovenox.  9. Hx of Seizures - on Phenytoin rx, check level. D/c tramadol;   Code  Status: full  Family Communication:  Raydyn, Hewey Spouse [phone] updated  Disposition Plan: pend evall;    Consultants:    Procedures:    Antibiotics: Cefepime started on 8/31;  Patient was on IV vanc 2 weeks for foot infection  HPI/Subjective: Patient mild confused, no fever, no chest pain, no SOB butuhas cough , no acute bleeding   Objective: Filed Vitals:   11/18/12 0443  BP: 161/59  Pulse: 96  Temp: 101.2 F (38.4 C)  Resp: 27    Intake/Output Summary (Last 24 hours) at 11/18/12 0725 Last data filed at 11/18/12 0417  Gross per 24 hour  Intake 2614.17 ml  Output   2275 ml  Net 339.17 ml   Filed Weights   11/16/12 2320 11/18/12 0443  Weight: 80.287 kg (177 lb) 80.3 kg (177 lb 0.5 oz)    Exam:   General:  Awake   Cardiovascular: S1, S2 RRR  Respiratory: LL rhonchi   Abdomen: soft, NT, ND +BS   Musculoskeletal: R LE post op wound, edema, no discharge    Data Reviewed: Basic Metabolic Panel:  Recent Labs Lab 11/16/12 2330 11/17/12 0500 11/18/12 0425  NA 131* 134* 134*  K 4.0 3.7 4.2  CL 100 104 105  CO2 20 20 20   GLUCOSE 167* 104* 197*  BUN 23 22 24*  CREATININE 1.58* 1.58* 1.61*  CALCIUM 8.2* 7.8* 8.2*   Liver Function Tests:  Recent Labs Lab 11/16/12 2330  AST  35  ALT 35  ALKPHOS 97  BILITOT 0.3  PROT 7.2  ALBUMIN 2.1*   No results found for this basename: LIPASE, AMYLASE,  in the last 168 hours No results found for this basename: AMMONIA,  in the last 168 hours CBC:  Recent Labs Lab 11/16/12 2330 11/17/12 0500 11/17/12 1000 11/18/12 0425  WBC 13.8* 13.0*  --  14.2*  NEUTROABS 9.5*  --   --   --   HGB 6.3* 5.7* 7.0* 9.5*  HCT 19.1* 17.2* 20.8* 28.0*  MCV 86.4 86.0  --  85.9  PLT 390 359  --  335   Cardiac Enzymes: No results found for this basename: CKTOTAL, CKMB, CKMBINDEX, TROPONINI,  in the last 168 hours BNP (last 3 results)  Recent Labs  11/17/12 0016  PROBNP 4406.0*    CBG:  Recent Labs Lab 11/17/12 1200 11/17/12 1714 11/17/12 2024 11/17/12 2328 11/18/12 0448  GLUCAP 125* 174* 247* 235* 177*    Recent Results (from the past 240 hour(s))  MRSA PCR SCREENING     Status: None   Collection Time    11/17/12  5:00 AM      Result Value Range Status   MRSA by PCR NEGATIVE  NEGATIVE Final   Comment:            The GeneXpert MRSA Assay (FDA     approved for NASAL specimens     only), is one component of a     comprehensive MRSA colonization     surveillance program. It is not     intended to diagnose MRSA     infection nor to guide or     monitor treatment for     MRSA infections.     Studies: Dg Chest 2 View  11/17/2012   CLINICAL DATA:  Fever, upper back pain.  EXAM: CHEST  2 VIEW  COMPARISON:  10/10/2011  FINDINGS: Patchy bilateral airspace opacities are noted, most pronounced in the upper lobes. Findings concerning for multifocal pneumonia. Heart is borderline in size. Small bilateral pleural effusions. No acute bony abnormality.  IMPRESSION: Patchy multifocal bilateral airspace disease concerning for multifocal pneumonia. Small bilateral effusions.   Electronically Signed   By: Rolm Baptise   On: 11/17/2012 01:51    Scheduled Meds: . albuterol  2.5 mg Nebulization TID  . aspirin  325 mg Oral Daily  . atorvastatin  20 mg Oral Daily  . heparin subcutaneous  5,000 Units Subcutaneous Q8H  . imipenem-cilastatin  500 mg Intravenous Q8H  . insulin aspart  0-9 Units Subcutaneous Q4H  . insulin glargine  24 Units Subcutaneous QHS  . latanoprost  1 drop Both Eyes QHS  . metoprolol  25 mg Oral BID  . phenytoin  200 mg Oral BID  . sodium chloride  1,000 mL Intravenous Once  . tamsulosin  0.4 mg Oral QPC supper  . timolol  1 drop Both Eyes Daily  . vancomycin  750 mg Intravenous Q12H   Continuous Infusions:    Principal Problem:   Sepsis Active Problems:   AKI (acute kidney injury)   Altered mental status   DM2 (diabetes mellitus, type  2)   Right foot ulcer   HCAP (healthcare-associated pneumonia)   Hypertension   Anemia    Time spent: > 30 minutes     Kinnie Feil  [facility] Pager [phone]. If 7PM-7AM, please contact night-coverage at [email or web address], password [facility] 11/18/2012, 7:25 AM  LOS: 2 days

## 2012-11-18 NOTE — Progress Notes (Signed)
[facility]  Patient Status: Active (receiving services up to time of hospitalization)  [facility] is providing the following services: RN, PT and OT  If patient discharges after hours, please call [phone].   [name] 11/18/2012, 11:14 AM

## 2012-11-18 NOTE — Progress Notes (Signed)
UR Completed.  Vergie Living [id number] 11/18/2012

## 2012-11-19 LAB — CBC WITH DIFFERENTIAL/PLATELET
Basophils Absolute: 0 10*3/uL (ref 0.0–0.1)
HCT: 27.9 % — ABNORMAL LOW (ref 39.0–52.0)
Hemoglobin: 9.2 g/dL — ABNORMAL LOW (ref 13.0–17.0)
Lymphocytes Relative: 6 % — ABNORMAL LOW (ref 12–46)
Lymphs Abs: 0.9 10*3/uL (ref 0.7–4.0)
MCV: 85.8 fL (ref 78.0–100.0)
Monocytes Absolute: 2.1 10*3/uL — ABNORMAL HIGH (ref 0.1–1.0)
Monocytes Relative: 15 % — ABNORMAL HIGH (ref 3–12)
Neutro Abs: 10.5 10*3/uL — ABNORMAL HIGH (ref 1.7–7.7)
RBC: 3.25 MIL/uL — ABNORMAL LOW (ref 4.22–5.81)
RDW: 16.2 % — ABNORMAL HIGH (ref 11.5–15.5)
WBC: 14.1 10*3/uL — ABNORMAL HIGH (ref 4.0–10.5)

## 2012-11-19 LAB — TYPE AND SCREEN
Unit division: 0
Unit division: 0

## 2012-11-19 LAB — GLUCOSE, CAPILLARY
Glucose-Capillary: 208 mg/dL — ABNORMAL HIGH (ref 70–99)
Glucose-Capillary: 233 mg/dL — ABNORMAL HIGH (ref 70–99)
Glucose-Capillary: 188 mg/dL — ABNORMAL HIGH (ref 70–99)

## 2012-11-19 LAB — BASIC METABOLIC PANEL
CO2: 18 mEq/L — ABNORMAL LOW (ref 19–32)
Chloride: 104 mEq/L (ref 96–112)
Creatinine, Ser: 1.56 mg/dL — ABNORMAL HIGH (ref 0.50–1.35)
Glucose, Bld: 182 mg/dL — ABNORMAL HIGH (ref 70–99)

## 2012-11-19 LAB — VANCOMYCIN, TROUGH: Vancomycin Tr: 26.2 ug/mL (ref 10.0–20.0)

## 2012-11-19 MED ORDER — VANCOMYCIN HCL 10 G IV SOLR
1250.0000 mg | INTRAVENOUS | Status: DC
  Administered 2012-11-19 – 2012-11-22 (×4): 1250 mg via INTRAVENOUS
  Filled 2012-11-19 (×5): qty 1250

## 2012-11-19 NOTE — Clinical Documentation Improvement (Signed)
THIS DOCUMENT IS NOT A PERMANENT PART OF THE MEDICAL RECORD  Please update your documentation with the medical record to reflect your response to this query.  If you need assistance,  please call [phone].  11/19/12  Dear Dr./Associates  In an effort to better capture your patient's severity of illness, reflect appropriate length of stay and utilization of resources, a review of the patient medical record has revealed the following indicators. Based on your clinical judgment, please clarify and document in a progress note and/or discharge summary the clinical condition associated with the following supporting information: In responding to this query please exercise your independent judgment.  The fact that a query is asked, does not imply that any particular answer is desired or expected.  Possible Clinical Conditions?   Encephalopathy (describe type if known)                       Septic                       Metabolic                       Toxic  Other Condition  Cannot Clinically Determine   Supporting Information:  Risk Factors:inpatient 2 weeks ago for I&D of diabetic foot ulcer on right lateral foot, pneumonia, osteomyelitis; sepsis  Signs & Symptoms: confusion, AMS  Lab: Na+:131; WBC:17.3; Lactic Acid: 3.2  Radiology: Patchy multifocal bilateral airspace disease concerning for multifocal pneumonia. Small bilateral effusions   [id number] soft tissue infection and extensive osteomyelitis of the left foot. The area of incision and drainage communicates with a much larger serpentine abscess that tracks the length of the foot with septic arthritis from the ankle to the 1st MTP joint  Treatment:Vancomycin  Reviewed:  no additional documentation provided  Thank You,  Joya Salm RN, BSN, CCM Clinical Documentation Specialist: [phone] Tips and Tricks for How to Complete CDI Queries: 1. Access  Hospital Chart completion folder in your [facility]. 2. Select the CDI query deficiency.. 3. Review note from CDI in the inbasket report. 4. If needed, update documentation for pts encounter via notes activity. 5. After updating or not, access your deficiency again and click edit on the [employer]. 6. Click F2 to complete all required fields on review. 7. Sign note.  8. The deficiency will fall out of your InBasket.

## 2012-11-19 NOTE — Progress Notes (Signed)
ANTIBIOTIC CONSULT NOTE - FOLLOW-UP  Pharmacy Consult for vancomycin Indication: rule out pneumonia  Allergies  Allergen Reactions  . Codeine Anaphylaxis  . Penicillins Anaphylaxis    Patient Measurements: Height: 5\' 10"  (177.8 cm) Weight: 177 lb 0.5 oz (80.3 kg) IBW/kg (Calculated) : 73  Vital Signs: Temp: 99 F (37.2 C) (09/02 0700) Temp src: Oral (09/02 0700) BP: 149/70 mmHg (09/02 0700) Pulse Rate: 109 (09/02 0700)  Labs:  Recent Labs  11/17/12 0500  11/18/12 0425 11/18/12 0949 11/19/12 0700  WBC 13.0*  --  14.2*  --  14.1*  HGB 5.7*  < > 9.5* 9.5* 9.2*  PLT 359  --  335  --  351  CREATININE 1.58*  --  1.61*  --  1.56*  < > = values in this interval not displayed. Estimated Creatinine Clearance: 48.7 ml/min (by C-G formula based on Cr of 1.56).   Microbiology: Recent Results (from the past 720 hour(s))  CULTURE, BLOOD (ROUTINE X 2)     Status: None   Collection Time    10/23/12  4:15 AM      Result Value Range Status   Specimen Description BLOOD LEFT ARM   Final   Special Requests BOTTLES DRAWN AEROBIC AND ANAEROBIC 10CC EACH   Final   Culture  Setup Time     Final   Value: 10/23/2012 10:20     Performed at [facility]   Culture     Final   Value: NO GROWTH 5 DAYS     Performed at [facility]   Report Status 10/28/2012 FINAL   Final  CULTURE, BLOOD (ROUTINE X 2)     Status: None   Collection Time    10/23/12  4:15 AM      Result Value Range Status   Specimen Description BLOOD LEFT HAND   Final   Special Requests BOTTLES DRAWN AEROBIC ONLY 10CC   Final   Culture  Setup Time     Final   Value: 10/23/2012 10:19     Performed at [facility]   Culture     Final   Value: NO GROWTH 5 DAYS     Performed at [facility]   Report Status 10/28/2012 FINAL   Final  MRSA PCR SCREENING     Status: None   Collection Time    10/23/12  5:41 AM      Result Value Range Status   MRSA by PCR NEGATIVE  NEGATIVE Final   Comment:             The GeneXpert MRSA Assay (FDA     approved for NASAL specimens     only), is one component of a     comprehensive MRSA colonization     surveillance program. It is not     intended to diagnose MRSA     infection nor to guide or     monitor treatment for     MRSA infections.  ANAEROBIC CULTURE     Status: None   Collection Time    10/28/12  5:23 PM      Result Value Range Status   Specimen Description ABSCESS FOOT RIGHT   Final   Special Requests NONE POF VANCOMYCIN CIPRO   Final   Gram Stain     Final   Value: NO WBC SEEN     NO SQUAMOUS EPITHELIAL CELLS SEEN     NO ORGANISMS SEEN     Performed at [facility]   Culture     Final   Value: NO ANAEROBES ISOLATED     Performed at [facility]   Report Status 11/01/2012 FINAL   Final  CULTURE, ROUTINE-ABSCESS     Status: None   Collection Time    10/28/12  5:23 PM      Result Value Range Status   Specimen Description ABSCESS FOOT RIGHT   Final   Special Requests NONE POF VANCOMYCIN CIPRO   Final   Gram Stain     Final   Value: NO WBC SEEN     NO SQUAMOUS EPITHELIAL CELLS SEEN     NO ORGANISMS SEEN     Performed at [facility]   Culture     Final   Value: MODERATE STAPHYLOCOCCUS AUREUS     Note: RIFAMPIN AND GENTAMICIN SHOULD NOT BE USED AS SINGLE DRUGS FOR TREATMENT OF STAPH INFECTIONS.     Performed at [facility]   Report Status 10/31/2012 FINAL   Final   Organism ID, Bacteria STAPHYLOCOCCUS AUREUS   Final  CULTURE, BLOOD (ROUTINE X 2)     Status: None   Collection Time    11/17/12 12:40 AM      Result Value Range Status   Specimen Description BLOOD LEFT ARM   Final   Special Requests BOTTLES DRAWN AEROBIC ONLY 6CC   Final   Culture  Setup Time     Final   Value: 11/17/2012 04:24     Performed at [facility]   Culture     Final   Value:        BLOOD CULTURE RECEIVED NO GROWTH TO DATE CULTURE WILL BE HELD FOR 5 DAYS BEFORE ISSUING A FINAL NEGATIVE REPORT      Performed at [facility]   Report Status PENDING   Incomplete  CULTURE, BLOOD (ROUTINE X 2)     Status: None   Collection Time    11/17/12  2:58 AM      Result Value Range Status   Specimen Description BLOOD LEFT ARM   Final   Special Requests BOTTLES DRAWN AEROBIC ONLY 6CC   Final   Culture  Setup Time     Final   Value: 11/17/2012 16:34     Performed at [facility]   Culture     Final   Value:        BLOOD CULTURE RECEIVED NO GROWTH TO DATE CULTURE WILL BE HELD FOR 5 DAYS BEFORE ISSUING A FINAL NEGATIVE REPORT     Performed at [facility]   Report Status PENDING   Incomplete  MRSA PCR SCREENING     Status: None   Collection Time    11/17/12  5:00 AM      Result Value Range Status   MRSA by PCR NEGATIVE  NEGATIVE Final   Comment:            The GeneXpert MRSA Assay (FDA     approved for NASAL specimens     only), is one component of a     comprehensive MRSA colonization     surveillance program. It is not     intended to diagnose MRSA     infection nor to guide or     monitor treatment for     MRSA infections.    Medical History: Past Medical History  Diagnosis Date  . Diabetes mellitus   . Hypertension   . Seizures   .  Stroke   . Peripheral vascular disease   . CKD (chronic kidney disease)     Stage II (per 06/2010 notes)  . Neuromuscular disorder   . Pneumonia   . Anemia     Medications:  Prescriptions prior to admission  Medication Sig Dispense Refill  . aspirin 325 MG tablet Take 325 mg by mouth daily.      [name] atorvastatin (LIPITOR) 20 MG tablet Take 20 mg by mouth daily.      . ciprofloxacin (CIPRO) 250 MG tablet Take 250 mg by mouth 2 (two) times daily.      [name] guaiFENesin-dextromethorphan (ROBITUSSIN DM) 100-10 MG/5ML syrup Take 15 mLs by mouth every 4 (four) hours as needed for cough.      . insulin aspart protamine- aspart (NOVOLOG MIX 70/30) (70-30) 100 UNIT/ML injection Inject 6-15 Units into the skin See admin instructions.  Takes 15 units in the morning, and 6 units in the evening      . insulin glargine (LANTUS) 100 UNIT/ML injection Inject 24 Units into the skin at bedtime.      . meloxicam (MOBIC) 7.5 MG tablet Take 7.5 mg by mouth daily.      . metoprolol (LOPRESSOR) 50 MG tablet Take 25 mg by mouth 2 (two) times daily.      . phenytoin (DILANTIN) 200 MG ER capsule Take 1 capsule (200 mg total) by mouth 2 (two) times daily.  60 capsule  3  . polyethylene glycol (MIRALAX / GLYCOLAX) packet Take 17 g by mouth daily as needed (constipation).  14 each  0  . senna-docusate (SENOKOT-S) 8.6-50 MG per tablet Take 1 tablet by mouth daily.      . sodium chloride 0.9 % injection 10-40 mL by Intracatheter route as needed (flush).  10 mL  20  . tamsulosin (FLOMAX) 0.4 MG CAPS capsule Take 1 capsule (0.4 mg total) by mouth daily after supper.  30 capsule  5  . timolol (BETIMOL) 0.5 % ophthalmic solution Place 1 drop into both eyes every morning.       . traMADol (ULTRAM) 50 MG tablet Take 1 tablet (50 mg total) by mouth every 6 (six) hours as needed for pain. Maximum dose= 8 tablets per day  60 tablet  0  . Travoprost, BAK Free, ([name]) 0.004 % SOLN ophthalmic solution Place 1 drop into both eyes at bedtime.      [name] VANCOMYCIN HCL IV Inject 1,000 mg into the vein every 12 (twelve) hours.       Scheduled:  . albuterol  2.5 mg Nebulization TID  . amLODipine  2.5 mg Oral Daily  . aspirin  325 mg Oral Daily  . atorvastatin  20 mg Oral Daily  . heparin subcutaneous  5,000 Units Subcutaneous Q8H  . imipenem-cilastatin  500 mg Intravenous Q8H  . insulin aspart  0-9 Units Subcutaneous Q4H  . insulin glargine  24 Units Subcutaneous QHS  . latanoprost  1 drop Both Eyes QHS  . metoprolol  25 mg Oral BID  . phenytoin  200 mg Oral BID  . silver sulfADIAZINE   Topical Daily  . sodium chloride  1,000 mL Intravenous Once  . tamsulosin  0.4 mg Oral QPC supper  . timolol  1 drop Both Eyes Daily  . vancomycin  1,250 mg Intravenous  Q24H   Infusions:     Assessment: 67yo male c/o low-grade fever w/ back pain for several days, CXR concerning for PNA, started on IV vancomycin 8/31.  Vancomycin trough  today is 26.2 (above goal) on vancomycin 750 mg IV q 12 hrs.  Tm 100.5, and WBC still mildly elevated.  Remains on Primaxin 500 mg IV q 8 hrs as well.  Cultures negative thus far.  Goal of Therapy:  Vancomycin trough level 15-20 mcg/ml  Plan:  1. Change vancomycin to 1250 mg IV q 24 hrs.  Next dose not needed until 1700 PM. 2. Continue Primaxin at current dosing. 3. Continue to monitor renal function, vancomycin trough at next steady state as needed.  Uvaldo Rising, BCPS  Clinical Pharmacist Pager [phone]  11/19/2012 8:46 AM

## 2012-11-19 NOTE — Progress Notes (Signed)
With critical value of vancomycin trough of 26.2

## 2012-11-19 NOTE — Progress Notes (Addendum)
[facility] PROGRESS NOTE  [name]. [id number] DOB: [birth year] DOA: 11/16/2012 PCP: [name], MD  Brief narrative  67 y.o. male with PMH of DM, HTN, h/o CVA, PAD anemia, seizure, CKD, s/p L BKA who was brought to the ED with complaints of confusion Coughing and fever and chills over the past 2 days. He was inpatient 2 weeks ago for an I+D of a diabetic foot ulcer on the right lateral foot that was performed by Dr [name]. He was discharge to home on 08/15 on atx.  -Patient is admitted with HCAP, sepsis, with ?R foot infection, and anemia with a hb =6.  Assessment/Plan: 1. Sepsis/HCAP; sepsis due to multifocal bilateral BL pneumonia and ?foot infection  -prelim Blood Cultures neg, cont IV Vancomycin and Primaxin (started 8/31); Albuterol Nebs, and O2 PRN.  -d/w pharmacy due to anaphylaxis to [facility], will cont primaxin with close monitoring due to h/o seizure;  -still intermittent fever 9/1-9/2; hemodynamically stable; close monitoring   2. CKD III; likely DM nephropathy; d/ced NSAIDS; monitor   3. Altered Mental Status likely sepsis related; suspected metabolic toxic encephalopathy;   Non focal exam; Monitor   4. DM2; Pt was recently started lantus  24 units; cont SSI coverage, Check HBA1C-5.7; mild hyperglycemia likely due to sepsis   5. HTN- Continue metoprolol 25 bid,  Added amlodipine, titrate meds; And add IV Hydralazine PRn.   6 Right Foot Wounds; Dr [name] who performed I+D on lateral foot ulcer of right foot 2 weeks ago.  -patient was on IV vanc prior to admission for foot infection  -elevated ESR,CRP; ? Persistent osteo+foot infection  MRI 11/18/2012: IMPRESSION:  Complex soft tissue infection and extensive osteomyelitis of the left foot. The area of incision and drainage communicates with a much larger serpentine abscess that tracks the length of the foot  With eptic arthritis from the ankle to the 1st MTP joint. -per ortho findings: MRI scan is more consistent  with the Charcot arthropathy; defer to ortho need close monitoring   7. Anemia-TF 3 units; repeat Hg-5.8--->7.0--> HG-9.5; likely anemia of chronic disease+sepsis  -no s/s of acute bleeding; monitor   8. Hx of Seizures - on Phenytoin rx, check level. D/c tramadol; 9. DVT prophylaxis with Lovenox.   Called updated his wife; all questions answered; Rase,[name] Spouse [phone]  Code Status: full  Family Communication:  Kason, Brashear Spouse [phone] updated  Disposition Plan: pend evall;    Consultants:  Dr. [name]   Procedures:  MRI foot  Antibiotics: -imipenem+vanc 8/31>>  HPI/Subjective: Patient mild confused, no fever, no chest pain, no SOB butuhas cough , no acute bleeding   Objective: Filed Vitals:   11/19/12 0339  BP: 151/74  Pulse: 107  Temp: 100.3 F (37.9 C)  Resp: 31    Intake/Output Summary (Last 24 hours) at 11/19/12 0808 Last data filed at 11/19/12 0500  Gross per 24 hour  Intake   1320 ml  Output    750 ml  Net    570 ml   Filed Weights   11/16/12 2320 11/18/12 0443  Weight: 80.287 kg (177 lb) 80.3 kg (177 lb 0.5 oz)    Exam:   General:  Awake   Cardiovascular: S1, S2 RRR  Respiratory: LL rhonchi   Abdomen: soft, NT, ND +BS   Musculoskeletal: R LE post op wound, edema, no discharge    Data Reviewed: Basic Metabolic Panel:  Recent Labs Lab 11/16/12 2330 11/17/12 0500 11/18/12 0425  NA  131* 134* 134*  K 4.0 3.7 4.2  CL 100 104 105  CO2 20 20 20   GLUCOSE 167* 104* 197*  BUN 23 22 24*  CREATININE 1.58* 1.58* 1.61*  CALCIUM 8.2* 7.8* 8.2*   Liver Function Tests:  Recent Labs Lab 11/16/12 2330  AST 35  ALT 35  ALKPHOS 97  BILITOT 0.3  PROT 7.2  ALBUMIN 2.1*   No results found for this basename: LIPASE, AMYLASE,  in the last 168 hours No results found for this basename: AMMONIA,  in the last 168 hours CBC:  Recent Labs Lab 11/16/12 2330 11/17/12 0500  11/17/12 1000 11/18/12 0425 11/18/12 0949 11/19/12 0700  WBC 13.8* 13.0*  --  14.2*  --  14.1*  NEUTROABS 9.5*  --   --   --   --  10.5*  HGB 6.3* 5.7* 7.0* 9.5* 9.5* 9.2*  HCT 19.1* 17.2* 20.8* 28.0* 27.2* 27.9*  MCV 86.4 86.0  --  85.9  --  85.8  PLT 390 359  --  335  --  351   Cardiac Enzymes: No results found for this basename: CKTOTAL, CKMB, CKMBINDEX, TROPONINI,  in the last 168 hours BNP (last 3 results)  Recent Labs  11/17/12 0016  PROBNP 4406.0*   CBG:  Recent Labs Lab 11/18/12 1139 11/18/12 1604 11/18/12 2133 11/19/12 0036 11/19/12 0331  GLUCAP 164* 205* 254* 233* 208*    Recent Results (from the past 240 hour(s))  CULTURE, BLOOD (ROUTINE X 2)     Status: None   Collection Time    11/17/12 12:40 AM      Result Value Range Status   Specimen Description BLOOD LEFT ARM   Final   Special Requests BOTTLES DRAWN AEROBIC ONLY 6CC   Final   Culture  Setup Time     Final   Value: 11/17/2012 04:24     Performed at [facility]   Culture     Final   Value:        BLOOD CULTURE RECEIVED NO GROWTH TO DATE CULTURE WILL BE HELD FOR 5 DAYS BEFORE ISSUING A FINAL NEGATIVE REPORT     Performed at [facility]   Report Status PENDING   Incomplete  CULTURE, BLOOD (ROUTINE X 2)     Status: None   Collection Time    11/17/12  2:58 AM      Result Value Range Status   Specimen Description BLOOD LEFT ARM   Final   Special Requests BOTTLES DRAWN AEROBIC ONLY 6CC   Final   Culture  Setup Time     Final   Value: 11/17/2012 16:34     Performed at [facility]   Culture     Final   Value:        BLOOD CULTURE RECEIVED NO GROWTH TO DATE CULTURE WILL BE HELD FOR 5 DAYS BEFORE ISSUING A FINAL NEGATIVE REPORT     Performed at [facility]   Report Status PENDING   Incomplete  MRSA PCR SCREENING     Status: None   Collection Time    11/17/12  5:00 AM      Result Value Range Status   MRSA by PCR NEGATIVE  NEGATIVE Final   Comment:            The  GeneXpert MRSA Assay (FDA     approved for NASAL specimens     only), is one component of a     comprehensive MRSA colonization  surveillance program. It is not     intended to diagnose MRSA     infection nor to guide or     monitor treatment for     MRSA infections.     Studies: Mr Foot Right W Wo Contrast  11/18/2012   *RADIOLOGY REPORT*  Clinical Data: Osteomyelitis.  Foot ulcers.  Recent incision and drainage.  MRI OF THE RIGHT FOREFOOT WITHOUT AND WITH CONTRAST  Technique:  Multiplanar, multisequence MR imaging was performed both before and after administration of intravenous contrast.  Contrast: 64mL MULTIHANCE GADOBENATE DIMEGLUMINE 529 MG/ML IV SOLN  Comparison: None.  Findings: Transmetatarsal amputations have been performed at the third and fourth metatarsals.  There appears to have been debridement of the medial foot around the tarsometatarsal junction.  There is heterogeneous material extending and, probably representing wound packing material.  There is nonenhancing fluid around the packing material and the collection in the plantar midfoot measures 4 cm transverse by 2.6 cm plantar to dorsal.  This collection tracks proximally to the level of the talus and also extends into the posteromedial tendon sheaths, mainly the posterior tibial tendon.  Findings are consistent with infectious tenosynovitis.  There is another abscess pocket over the dorsal heel.  Neuropathic changes of the midfoot are present however with proximity of the abscess, osteomyelitis is present as well.  Additionally, there is osteomyelitis of the first metatarsal extending to the first MTP joint. On the post-gadolinium imaging, is abnormal enhancement in the tibial plafond and talar dome and ankle effusion compatible with septic arthritis of the ankle. The draining abscess extends distally along the plantar aspect of the great toe.  IMPRESSION: Complex soft tissue infection and extensive osteomyelitis of the left foot.   The area of incision and drainage communicates with a much larger serpentine abscess that tracks the length of the foot with septic arthritis from the ankle to the 1st MTP joint.   Original Report Authenticated By: Dereck Ligas, M.D.    Scheduled Meds: . albuterol  2.5 mg Nebulization TID  . amLODipine  2.5 mg Oral Daily  . aspirin  325 mg Oral Daily  . atorvastatin  20 mg Oral Daily  . heparin subcutaneous  5,000 Units Subcutaneous Q8H  . imipenem-cilastatin  500 mg Intravenous Q8H  . insulin aspart  0-9 Units Subcutaneous Q4H  . insulin glargine  24 Units Subcutaneous QHS  . latanoprost  1 drop Both Eyes QHS  . metoprolol  25 mg Oral BID  . phenytoin  200 mg Oral BID  . silver sulfADIAZINE   Topical Daily  . sodium chloride  1,000 mL Intravenous Once  . tamsulosin  0.4 mg Oral QPC supper  . timolol  1 drop Both Eyes Daily  . vancomycin  750 mg Intravenous Q12H   Continuous Infusions:    Principal Problem:   Sepsis Active Problems:   AKI (acute kidney injury)   Altered mental status   DM2 (diabetes mellitus, type 2)   Right foot ulcer   HCAP (healthcare-associated pneumonia)   Hypertension   Anemia    Time spent: > 30 minutes     [name]  [facility] Pager [phone]. If 7PM-7AM, please contact night-coverage at [email or web address], password [facility] 11/19/2012, 8:08 AM  LOS: 3 days

## 2012-11-20 ENCOUNTER — Inpatient Hospital Stay (HOSPITAL_COMMUNITY): Payer: Medicare Other

## 2012-11-20 LAB — CBC WITH DIFFERENTIAL/PLATELET
Basophils Relative: 0 % (ref 0–1)
Eosinophils Absolute: 1.1 10*3/uL — ABNORMAL HIGH (ref 0.0–0.7)
HCT: 27.2 % — ABNORMAL LOW (ref 39.0–52.0)
Hemoglobin: 9.2 g/dL — ABNORMAL LOW (ref 13.0–17.0)
MCH: 28.9 pg (ref 26.0–34.0)
MCHC: 33.8 g/dL (ref 30.0–36.0)
Monocytes Absolute: 1.6 10*3/uL — ABNORMAL HIGH (ref 0.1–1.0)
Monocytes Relative: 12 % (ref 3–12)

## 2012-11-20 LAB — BASIC METABOLIC PANEL
BUN: 23 mg/dL (ref 6–23)
Calcium: 8.3 mg/dL — ABNORMAL LOW (ref 8.4–10.5)
Creatinine, Ser: 1.54 mg/dL — ABNORMAL HIGH (ref 0.50–1.35)
GFR calc Af Amer: 53 mL/min — ABNORMAL LOW (ref >90)
GFR calc non Af Amer: 46 mL/min — ABNORMAL LOW (ref >90)

## 2012-11-20 LAB — GLUCOSE, CAPILLARY: Glucose-Capillary: 154 mg/dL — ABNORMAL HIGH (ref 70–99)

## 2012-11-20 MED ORDER — AMLODIPINE BESYLATE 5 MG PO TABS
5.0000 mg | ORAL_TABLET | Freq: Every day | ORAL | Status: DC
  Administered 2012-11-21 – 2012-11-25 (×5): 5 mg via ORAL
  Filled 2012-11-20 (×5): qty 1

## 2012-11-20 NOTE — Progress Notes (Signed)
[facility] Progress Note [place] TEAM 1 - Stepdown/ICU TEAM   [name]. [id number] DOB: October 22, 1945 DOA: 11/16/2012 PCP: [name], MD  Admit HPI / Brief Narrative: 67 y.o. male with PMH of DM, HTN, h/o CVA, PAD, anemia, seizure, CKD, s/p L BKA who was brought to the ED with complaints of confusion coughing and fever and chills over 2 days. He was inpatient 2 weeks ago for an I+D of a diabetic foot ulcer on the right lateral foot that was performed by Dr [name]. He was discharge to home on 08/15 on abx.   Patient was admitted with diag of HCAP, sepsis, with ?R foot infection, and anemia with a hgb of 6.   Assessment/Plan:  Sepsis (WBC 13.8 + HR 97 + HCAP) -sepsis due to multifocal bilateral pneumonia v/s ? foot infection  -sepsis physiology has improved -pt is hemodynamically stable  HCAP - patchy multifocal airspace disease  -cont empiric abx  -f/u CXR in AM  -symptomatically improved by report today   Right Foot Wounds / ?infection  -Dr [name] performed I+D on lateral foot ulcer of right foot 2 weeks prior to this admit -patient was on IV vanc prior to admission for foot infection  -elevated ESR,CRP not helpful due to probable active PNA  -per Ortho findings: MRI scan is more consistent with the Charcot arthropathy, vs/ abcess as interpreted by the Radiologist -ongoing foot/wound care per Ortho  CKD III likely DM nephropathy - crt is stable - baseline crt appears to be ~1.7  Altered Mental Status likely sepsis related; suspected metabolic toxic encephalopathy - appears to have resolved at this time   DM2 HBA1C-5.7 - CBG reasonably controlled - no change in tx plan today   HTN BP reasonably controlled at this time - follow w/o change today   Anemia S/p 3 units PRBC - repeat Hg-5.8--->7.0--> HG-9.5 - likely anemia of chronic disease + sepsis  -no s/s of acute bleeding and Hgb stable   Hx of Seizures Well compensated at present  Code Status:  FULL Family Communication: no family present at time of exam Disposition Plan: stable for transfer to medical bed   Consultants: Manson Passey [name]  Procedures: none  Antibiotics: Imipenem 8/31 >> Vanc 8/31 >>  DVT prophylaxis: lovenox  HPI/Subjective: Pt is alert and conversant.  He c/o some SOB, but states it responds well to neb tx.  He denies pain in foot, cp, n/v, or abdom pain.    Objective: Blood pressure 141/74, pulse 90, temperature 98.9 F (37.2 C), temperature source Oral, resp. rate 25, height 5\' 10"  (1.778 m), weight 88.5 kg (195 lb 1.7 oz), SpO2 97.00%.  Intake/Output Summary (Last 24 hours) at 11/20/12 1343 Last data filed at 11/20/12 1136  Gross per 24 hour  Intake   1370 ml  Output    800 ml  Net    570 ml   Exam: General: No acute respiratory distress Lungs: fine crackles th/o B mid to low fields - no wheeze  Cardiovascular: Regular rate and rhythm without murmur gallop or rub normal S1 and S2 Abdomen: Nontender, nondistended, soft, bowel sounds positive, no rebound, no ascites, no appreciable mass Extremities: s/p L LE amputation - R foot dressed and dry - no pain to palpation   Data Reviewed: Basic Metabolic Panel:  Recent Labs Lab 11/16/12 2330 11/17/12 0500 11/18/12 0425 11/19/12 0700 11/20/12 0442  NA 131* 134* 134* 134* 143  K 4.0 3.7 4.2 3.8 4.2  CL 100 104  105 104 113*  CO2 20 20 20  18* 20  GLUCOSE 167* 104* 197* 182* 111*  BUN 23 22 24* 22 23  CREATININE 1.58* 1.58* 1.61* 1.56* 1.54*  CALCIUM 8.2* 7.8* 8.2* 8.3* 8.3*   Liver Function Tests:  Recent Labs Lab 11/16/12 2330  AST 35  ALT 35  ALKPHOS 97  BILITOT 0.3  PROT 7.2  ALBUMIN 2.1*   CBC:  Recent Labs Lab 11/16/12 2330 11/17/12 0500 11/17/12 1000 11/18/12 0425 11/18/12 0949 11/19/12 0700 11/20/12 0442  WBC 13.8* 13.0*  --  14.2*  --  14.1* 13.5*  NEUTROABS 9.5*  --   --   --   --  10.5* 9.8*  HGB 6.3* 5.7* 7.0* 9.5* 9.5* 9.2* 9.2*  HCT 19.1* 17.2* 20.8* 28.0*  27.2* 27.9* 27.2*  MCV 86.4 86.0  --  85.9  --  85.8 85.5  PLT 390 359  --  335  --  351 339   BNP (last 3 results)  Recent Labs  11/17/12 0016  PROBNP 4406.0*   CBG:  Recent Labs Lab 11/19/12 2024 11/20/12 0021 11/20/12 0425 11/20/12 0735 11/20/12 1243  GLUCAP 231* 174* 113* 79 128*    Recent Results (from the past 240 hour(s))  CULTURE, BLOOD (ROUTINE X 2)     Status: None   Collection Time    11/17/12 12:40 AM      Result Value Range Status   Specimen Description BLOOD LEFT ARM   Final   Special Requests BOTTLES DRAWN AEROBIC ONLY 6CC   Final   Culture  Setup Time     Final   Value: 11/17/2012 04:24     Performed at [facility]   Culture     Final   Value:        BLOOD CULTURE RECEIVED NO GROWTH TO DATE CULTURE WILL BE HELD FOR 5 DAYS BEFORE ISSUING A FINAL NEGATIVE REPORT     Performed at [facility]   Report Status PENDING   Incomplete  CULTURE, BLOOD (ROUTINE X 2)     Status: None   Collection Time    11/17/12  2:58 AM      Result Value Range Status   Specimen Description BLOOD LEFT ARM   Final   Special Requests BOTTLES DRAWN AEROBIC ONLY 6CC   Final   Culture  Setup Time     Final   Value: 11/17/2012 16:34     Performed at [facility]   Culture     Final   Value:        BLOOD CULTURE RECEIVED NO GROWTH TO DATE CULTURE WILL BE HELD FOR 5 DAYS BEFORE ISSUING A FINAL NEGATIVE REPORT     Performed at [facility]   Report Status PENDING   Incomplete  MRSA PCR SCREENING     Status: None   Collection Time    11/17/12  5:00 AM      Result Value Range Status   MRSA by PCR NEGATIVE  NEGATIVE Final   Comment:            The GeneXpert MRSA Assay (FDA     approved for NASAL specimens     only), is one component of a     comprehensive MRSA colonization     surveillance program. It is not     intended to diagnose MRSA     infection nor to guide or     monitor treatment for     MRSA  infections.     Studies:  Recent x-ray  studies have been reviewed in detail by the Attending Physician  Scheduled Meds:  Scheduled Meds: . albuterol  2.5 mg Nebulization TID  . amLODipine  2.5 mg Oral Daily  . aspirin  325 mg Oral Daily  . atorvastatin  20 mg Oral Daily  . heparin subcutaneous  5,000 Units Subcutaneous Q8H  . imipenem-cilastatin  500 mg Intravenous Q8H  . insulin aspart  0-9 Units Subcutaneous Q4H  . insulin glargine  24 Units Subcutaneous QHS  . latanoprost  1 drop Both Eyes QHS  . metoprolol  25 mg Oral BID  . phenytoin  200 mg Oral BID  . silver sulfADIAZINE   Topical Daily  . sodium chloride  1,000 mL Intravenous Once  . tamsulosin  0.4 mg Oral QPC supper  . timolol  1 drop Both Eyes Daily  . vancomycin  1,250 mg Intravenous Q24H    Time spent on care of this patient: 35 mins   [place]  [phone] Pager - Text Page per Shea Evans as per below:  On-Call/Text Page:      [email or web address]      password [email or web address]  If 7PM-7AM, please contact night-coverage [email or web address] Password [email or web address] 11/20/2012, 1:43 PM   LOS: 4 days

## 2012-11-21 LAB — GLUCOSE, CAPILLARY
Glucose-Capillary: 150 mg/dL — ABNORMAL HIGH (ref 70–99)
Glucose-Capillary: 185 mg/dL — ABNORMAL HIGH (ref 70–99)
Glucose-Capillary: 188 mg/dL — ABNORMAL HIGH (ref 70–99)

## 2012-11-21 LAB — CBC
HCT: 27.7 % — ABNORMAL LOW (ref 39.0–52.0)
Hemoglobin: 9.2 g/dL — ABNORMAL LOW (ref 13.0–17.0)
MCV: 86.8 fL (ref 78.0–100.0)
RBC: 3.19 MIL/uL — ABNORMAL LOW (ref 4.22–5.81)
WBC: 13 10*3/uL — ABNORMAL HIGH (ref 4.0–10.5)

## 2012-11-21 LAB — BASIC METABOLIC PANEL
CO2: 19 mEq/L (ref 19–32)
Chloride: 107 mEq/L (ref 96–112)
Glucose, Bld: 152 mg/dL — ABNORMAL HIGH (ref 70–99)
Sodium: 136 mEq/L (ref 135–145)

## 2012-11-21 MED ORDER — LISINOPRIL 10 MG PO TABS
10.0000 mg | ORAL_TABLET | Freq: Every day | ORAL | Status: DC
  Administered 2012-11-21 – 2012-11-25 (×5): 10 mg via ORAL
  Filled 2012-11-21 (×5): qty 1

## 2012-11-21 MED ORDER — INSULIN ASPART 100 UNIT/ML ~~LOC~~ SOLN
0.0000 [IU] | Freq: Three times a day (TID) | SUBCUTANEOUS | Status: DC
  Administered 2012-11-21: 3 [IU] via SUBCUTANEOUS
  Administered 2012-11-21: 2 [IU] via SUBCUTANEOUS

## 2012-11-21 MED ORDER — INSULIN ASPART 100 UNIT/ML ~~LOC~~ SOLN
0.0000 [IU] | Freq: Three times a day (TID) | SUBCUTANEOUS | Status: DC
  Administered 2012-11-21 – 2012-11-22 (×3): 5 [IU] via SUBCUTANEOUS
  Administered 2012-11-22: 8 [IU] via SUBCUTANEOUS
  Administered 2012-11-23: 2 [IU] via SUBCUTANEOUS
  Administered 2012-11-23: 5 [IU] via SUBCUTANEOUS
  Administered 2012-11-23: 3 [IU] via SUBCUTANEOUS
  Administered 2012-11-25: 5 [IU] via SUBCUTANEOUS
  Administered 2012-11-25: 3 [IU] via SUBCUTANEOUS

## 2012-11-21 MED ORDER — INSULIN ASPART 100 UNIT/ML ~~LOC~~ SOLN
0.0000 [IU] | Freq: Every day | SUBCUTANEOUS | Status: DC
  Administered 2012-11-22: 3 [IU] via SUBCUTANEOUS

## 2012-11-21 NOTE — Progress Notes (Addendum)
[facility] Progress Note [place] TEAM 1 - Stepdown/ICU TEAM   [name]. [id number] DOB: [birth year] DOA: 11/16/2012 PCP: [name], MD  Admit HPI / Brief Narrative: 67 y.o. male with PMH of DM, HTN, h/o CVA, PAD, anemia, seizure, CKD, s/p L BKA who was brought to the ED with complaints of confusion coughing and fever and chills over 2 days. He was inpatient 2 weeks ago for an I+D of a diabetic foot ulcer on the right lateral foot that was performed by Dr [name]. He was discharge to home on 08/15 on abx.   Patient was admitted with diag of HCAP, sepsis, with ?R foot infection, and anemia with a hgb of 6.  Dr [name] does not feel that there is no additional foot infection present.  For his Pneumonia, Imipenem seems to have been effective.   Assessment/Plan:  Sepsis (WBC 13.8 + HR 97 + HCAP) -sepsis due to multifocal bilateral pneumonia v/s ? foot infection  -sepsis physiology has improved -pt is hemodynamically stable  HCAP - patchy multifocal airspace disease  -cont Imipenem will recommend total of 10-14 days - has PICC- [facility] it -symptomatically improved   Right Foot Wounds / ?infection  -Dr [name] performed I+D on lateral foot ulcer of right foot 2 weeks prior to this admit -patient was on IV vanc prior to admission for foot infection  -elevated ESR,CRP not helpful due to probable active PNA  -per Ortho findings: MRI scan is more consistent with the Charcot arthropathy, vs/ abcess as interpreted by the Radiologist -ongoing foot/wound care per Ortho  CKD III likely DM nephropathy - crt is stable - baseline crt appears to be ~1.7  Altered Mental Status likely sepsis related; suspected metabolic toxic encephalopathy - appears to have resolved at this time   DM2 HBA1C-5.7 - CBG reasonably controlled - no change in tx plan today   HTN BP elevated- due to concurrent DM, will start Lisinopril  Anemia S/p 3 units PRBC  - repeat Hg-5.8--->7.0--> HG-9.5 - likely anemia of chronic disease + sepsis  -no s/s of acute bleeding and Hgb stable  - anemia profile consistent with AOCD  Hx of Seizures Well compensated at present  Code Status: FULL Family Communication: no family present at time of exam Disposition Plan: stable for transfer to medical bed- home in 1-2 days   Consultants: Manson Passey [name]  Procedures: none  Antibiotics: Imipenem 8/31 >> Vanc 8/31 >>  DVT prophylaxis: lovenox  HPI/Subjective: Pt is alert - no confusion, cough has improved- dressing on foot opened- pt states the swelling has improved significantly-  lives with wife at home and has a private caretaker as well-   Objective: Blood pressure 150/74, pulse 97, temperature 98.7 F (37.1 C), temperature source Oral, resp. rate 97, height 5\' 10"  (1.778 m), weight 88.5 kg (195 lb 1.7 oz), SpO2 95.00%.  Intake/Output Summary (Last 24 hours) at 11/21/12 1603 Last data filed at 11/21/12 0800  Gross per 24 hour  Intake   1126 ml  Output   1025 ml  Net    101 ml   Exam: General: No acute respiratory distress Lungs: fine crackles th/o B mid to low fields - no wheeze  Cardiovascular: Regular rate and rhythm without murmur gallop or rub normal S1 and S2 Abdomen: Nontender, nondistended, soft, bowel sounds positive, no rebound, no ascites, no appreciable mass Extremities: s/p L LE amputation - R foot dressed and dry - no pain  to palpation   Data Reviewed: Basic Metabolic Panel:  Recent Labs Lab 11/17/12 0500 11/18/12 0425 11/19/12 0700 11/20/12 0442 11/21/12 0500  NA 134* 134* 134* 143 136  K 3.7 4.2 3.8 4.2 4.1  CL 104 105 104 113* 107  CO2 20 20 18* 20 19  GLUCOSE 104* 197* 182* 111* 152*  BUN 22 24* 22 23 24*  CREATININE 1.58* 1.61* 1.56* 1.54* 1.48*  CALCIUM 7.8* 8.2* 8.3* 8.3* 8.4   Liver Function Tests:  Recent Labs Lab 11/16/12 2330  AST 35  ALT 35  ALKPHOS 97  BILITOT 0.3  PROT 7.2  ALBUMIN 2.1*    CBC:  Recent Labs Lab 11/16/12 2330 11/17/12 0500  11/18/12 0425 11/18/12 0949 11/19/12 0700 11/20/12 0442 11/21/12 0500  WBC 13.8* 13.0*  --  14.2*  --  14.1* 13.5* 13.0*  NEUTROABS 9.5*  --   --   --   --  10.5* 9.8*  --   HGB 6.3* 5.7*  < > 9.5* 9.5* 9.2* 9.2* 9.2*  HCT 19.1* 17.2*  < > 28.0* 27.2* 27.9* 27.2* 27.7*  MCV 86.4 86.0  --  85.9  --  85.8 85.5 86.8  PLT 390 359  --  335  --  351 339 332  < > = values in this interval not displayed. BNP (last 3 results)  Recent Labs  11/17/12 0016  PROBNP 4406.0*   CBG:  Recent Labs Lab 11/20/12 1941 11/21/12 0036 11/21/12 0434 11/21/12 0737 11/21/12 1135  GLUCAP 208* 166* 150* 185* 215*    Recent Results (from the past 240 hour(s))  CULTURE, BLOOD (ROUTINE X 2)     Status: None   Collection Time    11/17/12 12:40 AM      Result Value Range Status   Specimen Description BLOOD LEFT ARM   Final   Special Requests BOTTLES DRAWN AEROBIC ONLY 6CC   Final   Culture  Setup Time     Final   Value: 11/17/2012 04:24     Performed at [facility]   Culture     Final   Value:        BLOOD CULTURE RECEIVED NO GROWTH TO DATE CULTURE WILL BE HELD FOR 5 DAYS BEFORE ISSUING A FINAL NEGATIVE REPORT     Performed at [facility]   Report Status PENDING   Incomplete  CULTURE, BLOOD (ROUTINE X 2)     Status: None   Collection Time    11/17/12  2:58 AM      Result Value Range Status   Specimen Description BLOOD LEFT ARM   Final   Special Requests BOTTLES DRAWN AEROBIC ONLY 6CC   Final   Culture  Setup Time     Final   Value: 11/17/2012 16:34     Performed at [facility]   Culture     Final   Value:        BLOOD CULTURE RECEIVED NO GROWTH TO DATE CULTURE WILL BE HELD FOR 5 DAYS BEFORE ISSUING A FINAL NEGATIVE REPORT     Performed at [facility]   Report Status PENDING   Incomplete  MRSA PCR SCREENING     Status: None   Collection Time    11/17/12  5:00 AM      Result Value Range Status    MRSA by PCR NEGATIVE  NEGATIVE Final   Comment:            The GeneXpert MRSA Assay (FDA  approved for NASAL specimens     only), is one component of a     comprehensive MRSA colonization     surveillance program. It is not     intended to diagnose MRSA     infection nor to guide or     monitor treatment for     MRSA infections.     Studies:  Recent x-ray studies have been reviewed in detail by the Attending Physician  Scheduled Meds:  Scheduled Meds: . albuterol  2.5 mg Nebulization TID  . amLODipine  5 mg Oral Daily  . aspirin  325 mg Oral Daily  . atorvastatin  20 mg Oral Daily  . heparin subcutaneous  5,000 Units Subcutaneous Q8H  . imipenem-cilastatin  500 mg Intravenous Q8H  . insulin aspart  0-15 Units Subcutaneous TID WC  . insulin aspart  0-5 Units Subcutaneous QHS  . insulin glargine  24 Units Subcutaneous QHS  . latanoprost  1 drop Both Eyes QHS  . metoprolol  25 mg Oral BID  . phenytoin  200 mg Oral BID  . silver sulfADIAZINE   Topical Daily  . sodium chloride  1,000 mL Intravenous Once  . tamsulosin  0.4 mg Oral QPC supper  . timolol  1 drop Both Eyes Daily  . vancomycin  1,250 mg Intravenous Q24H    Time spent on care of this patient: 35 mins   [place]  [phone] Pager - Text Page per Shea Evans as per below:  On-Call/Text Page:      [email or web address]      password [email or web address]  If 7PM-7AM, please contact night-coverage [email or web address] Password [email or web address] 11/21/2012, 4:03 PM   LOS: 5 days       I have examined the patient, reviewed the chart and modified the above note which I agree with.   Rishab Stoudt,MD [id number] 11/21/2012, 4:15 PM

## 2012-11-22 LAB — PHENYTOIN LEVEL, FREE AND TOTAL: Phenytoin Bound: 3.2 mg/L

## 2012-11-22 MED ORDER — INSULIN ASPART 100 UNIT/ML ~~LOC~~ SOLN
6.0000 [IU] | Freq: Three times a day (TID) | SUBCUTANEOUS | Status: DC
  Administered 2012-11-22 – 2012-11-23 (×2): 6 [IU] via SUBCUTANEOUS

## 2012-11-22 NOTE — Progress Notes (Signed)
[facility] Progress Note [place] TEAM 1 - Stepdown/ICU TEAM   [name]. [id number] DOB: July 13, 1945 DOA: 11/16/2012 PCP: [name], MD  Admit HPI / Brief Narrative: 67 y.o. male with PMH of DM, HTN, h/o CVA, PAD, anemia, seizure, CKD, s/p L BKA who was brought to the ED with complaints of confusion coughing and fever and chills over 2 days. He was inpatient 2 weeks ago for an I+D of a diabetic foot ulcer on the right lateral foot that was performed by Dr [name]. He was discharge to home on 08/15 on abx.   Patient was admitted with diag of HCAP, sepsis, with ?R foot infection, and anemia with a hgb of 6.    Assessment/Plan:  Sepsis (WBC 13.8 + HR 97 + HCAP) -sepsis due to multifocal bilateral pneumonia v/s ? foot infection  -sepsis physiology has improved -pt is hemodynamically stable  HCAP - patchy multifocal airspace disease  -cont empiric abx for total of 10 days  -symptomatically improved - CXR stable in f/u on 9/3 - O2 requirements on decline   Right Foot Wounds / ?infection  -Dr [name] performed I+D on lateral foot ulcer of right foot 2 weeks prior to this admit -patient was on IV vanc prior to admission for foot infection  -elevated ESR,CRP not helpful due to probable active PNA  -per Ortho findings: MRI scan is more consistent with the Charcot arthropathy vs/ abcess as interpreted by the Radiologist -ongoing foot/wound care per Ortho  CKD III likely DM nephropathy - crt is stable - baseline crt appears to be ~1.7  Altered Mental Status likely sepsis related; suspected metabolic toxic encephalopathy - appears to have resolved at this time   DM2 HBA1C-5.7 - CBG poorly controlled - adjust tx plan and follow   HTN BP elevated- due to concurrent DM, started Lisinopril  Anemia S/p 3 units PRBC - repeat Hg-5.8--->7.0--> HG-9.5 - likely anemia of chronic disease + sepsis  - no s/s of acute bleeding and Hgb stable  - anemia profile consistent with  AOCD  Hx of Seizures Well compensated at present  Code Status: FULL Family Communication: no family present at time of exam Disposition Plan: stable for transfer to medical bed - begin PT/OT eval and tx - complete abx course    Consultants: Ortho [name]  Procedures: none  Antibiotics: Imipenem 8/31 >> Vanc 8/31 >>  DVT prophylaxis: SQ heparin  HPI/Subjective: Pt has no new complaints.  Has not yet attempted any signif ambulation.  Denies cp, sob, f/c, n/v, or abdom pain.    Objective: Blood pressure 139/71, pulse 112, temperature 99.1 F (37.3 C), temperature source Oral, resp. rate 33, height 5\' 10"  (1.778 m), weight 90 kg (198 lb 6.6 oz), SpO2 93.00%.  Intake/Output Summary (Last 24 hours) at 11/22/12 1324 Last data filed at 11/22/12 1130  Gross per 24 hour  Intake    840 ml  Output   1000 ml  Net   -160 ml   Exam: General: No acute respiratory distress Lungs: fine crackles th/o B mid to low fields, diminished today - no wheeze  Cardiovascular: Regular rate and rhythm without murmur gallop or rub normal S1 and S2 Abdomen: Nontender, nondistended, soft, bowel sounds positive, no rebound, no ascites, no appreciable mass Extremities: s/p L LE amputation - R foot dressed and dry - no pain to palpation   Data Reviewed: Basic Metabolic Panel:  Recent Labs Lab 11/17/12 0500 11/18/12 0425 11/19/12 0700 11/20/12 0442 11/21/12 0500  NA 134* 134* 134* 143 136  K 3.7 4.2 3.8 4.2 4.1  CL 104 105 104 113* 107  CO2 20 20 18* 20 19  GLUCOSE 104* 197* 182* 111* 152*  BUN 22 24* 22 23 24*  CREATININE 1.58* 1.61* 1.56* 1.54* 1.48*  CALCIUM 7.8* 8.2* 8.3* 8.3* 8.4   Liver Function Tests:  Recent Labs Lab 11/16/12 2330  AST 35  ALT 35  ALKPHOS 97  BILITOT 0.3  PROT 7.2  ALBUMIN 2.1*   CBC:  Recent Labs Lab 11/16/12 2330 11/17/12 0500  11/18/12 0425 11/18/12 0949 11/19/12 0700 11/20/12 0442 11/21/12 0500  WBC 13.8* 13.0*  --  14.2*  --  14.1* 13.5*  13.0*  NEUTROABS 9.5*  --   --   --   --  10.5* 9.8*  --   HGB 6.3* 5.7*  < > 9.5* 9.5* 9.2* 9.2* 9.2*  HCT 19.1* 17.2*  < > 28.0* 27.2* 27.9* 27.2* 27.7*  MCV 86.4 86.0  --  85.9  --  85.8 85.5 86.8  PLT 390 359  --  335  --  351 339 332  < > = values in this interval not displayed. BNP (last 3 results)  Recent Labs  11/17/12 0016  PROBNP 4406.0*   CBG:  Recent Labs Lab 11/21/12 1135 11/21/12 1632 11/21/12 2203 11/22/12 0751 11/22/12 1133  GLUCAP 215* 212* 188* 230* 263*    Recent Results (from the past 240 hour(s))  CULTURE, BLOOD (ROUTINE X 2)     Status: None   Collection Time    11/17/12 12:40 AM      Result Value Range Status   Specimen Description BLOOD LEFT ARM   Final   Special Requests BOTTLES DRAWN AEROBIC ONLY 6CC   Final   Culture  Setup Time     Final   Value: 11/17/2012 04:24     Performed at [facility]   Culture     Final   Value:        BLOOD CULTURE RECEIVED NO GROWTH TO DATE CULTURE WILL BE HELD FOR 5 DAYS BEFORE ISSUING A FINAL NEGATIVE REPORT     Performed at [facility]   Report Status PENDING   Incomplete  CULTURE, BLOOD (ROUTINE X 2)     Status: None   Collection Time    11/17/12  2:58 AM      Result Value Range Status   Specimen Description BLOOD LEFT ARM   Final   Special Requests BOTTLES DRAWN AEROBIC ONLY 6CC   Final   Culture  Setup Time     Final   Value: 11/17/2012 16:34     Performed at [facility]   Culture     Final   Value:        BLOOD CULTURE RECEIVED NO GROWTH TO DATE CULTURE WILL BE HELD FOR 5 DAYS BEFORE ISSUING A FINAL NEGATIVE REPORT     Performed at [facility]   Report Status PENDING   Incomplete  MRSA PCR SCREENING     Status: None   Collection Time    11/17/12  5:00 AM      Result Value Range Status   MRSA by PCR NEGATIVE  NEGATIVE Final   Comment:            The GeneXpert MRSA Assay (FDA     approved for NASAL specimens     only), is one component of a     comprehensive  MRSA  colonization     surveillance program. It is not     intended to diagnose MRSA     infection nor to guide or     monitor treatment for     MRSA infections.     Studies:  Recent x-ray studies have been reviewed in detail by the Attending Physician  Scheduled Meds:  Scheduled Meds: . albuterol  2.5 mg Nebulization TID  . amLODipine  5 mg Oral Daily  . aspirin  325 mg Oral Daily  . atorvastatin  20 mg Oral Daily  . heparin subcutaneous  5,000 Units Subcutaneous Q8H  . imipenem-cilastatin  500 mg Intravenous Q8H  . insulin aspart  0-15 Units Subcutaneous TID WC  . insulin aspart  0-5 Units Subcutaneous QHS  . insulin glargine  24 Units Subcutaneous QHS  . latanoprost  1 drop Both Eyes QHS  . lisinopril  10 mg Oral Daily  . metoprolol  25 mg Oral BID  . phenytoin  200 mg Oral BID  . silver sulfADIAZINE   Topical Daily  . sodium chloride  1,000 mL Intravenous Once  . tamsulosin  0.4 mg Oral QPC supper  . timolol  1 drop Both Eyes Daily  . vancomycin  1,250 mg Intravenous Q24H    Time spent on care of this patient: 25 mins   [place]  [phone] Pager - Text Page per Shea Evans as per below:  On-Call/Text Page:      [email or web address]      password [email or web address]  If 7PM-7AM, please contact night-coverage [email or web address] Password [email or web address] 11/22/2012, 1:24 PM   LOS: 6 days

## 2012-11-23 LAB — GLUCOSE, CAPILLARY
Glucose-Capillary: 207 mg/dL — ABNORMAL HIGH (ref 70–99)
Glucose-Capillary: 162 mg/dL — ABNORMAL HIGH (ref 70–99)
Glucose-Capillary: 148 mg/dL — ABNORMAL HIGH (ref 70–99)
Glucose-Capillary: 147 mg/dL — ABNORMAL HIGH (ref 70–99)

## 2012-11-23 LAB — CULTURE, BLOOD (ROUTINE X 2): Culture: NO GROWTH

## 2012-11-23 LAB — VANCOMYCIN, TROUGH: Vancomycin Tr: 21.2 ug/mL — ABNORMAL HIGH (ref 10.0–20.0)

## 2012-11-23 MED ORDER — VANCOMYCIN HCL IN DEXTROSE 1-5 GM/200ML-% IV SOLN
1000.0000 mg | INTRAVENOUS | Status: DC
  Administered 2012-11-23: 1000 mg via INTRAVENOUS
  Filled 2012-11-23 (×2): qty 200

## 2012-11-23 MED ORDER — INSULIN ASPART 100 UNIT/ML ~~LOC~~ SOLN
8.0000 [IU] | Freq: Three times a day (TID) | SUBCUTANEOUS | Status: DC
  Administered 2012-11-23 – 2012-11-24 (×3): 8 [IU] via SUBCUTANEOUS

## 2012-11-23 MED ORDER — INSULIN GLARGINE 100 UNIT/ML ~~LOC~~ SOLN
30.0000 [IU] | Freq: Every day | SUBCUTANEOUS | Status: DC
  Administered 2012-11-23 – 2012-11-25 (×2): 30 [IU] via SUBCUTANEOUS
  Filled 2012-11-23 (×3): qty 0.3

## 2012-11-23 NOTE — Progress Notes (Signed)
Pharmacy: Vancomycin  67yom continues on vancomycin for HCAP and ? right foot infection. Vancomycin trough is slightly above goal at 21.2 (goal 15-20) on 1250mg  q24. Renal function improving.   Plan: 1) Decrease vancomycin to 1g q24 for predicted trough of 16.9 2) Re-check trough at new steady state  Nena Jordan, PharmD, BCPS 11/23/12, 5:21PM

## 2012-11-23 NOTE — Evaluation (Signed)
Physical Therapy Evaluation Patient Details Name: [name]. MRN: [id number] DOB: Mar 27, 1945 Today's Date: 11/23/2012 Time: [id number] PT Time Calculation (min): 35 min  PT Assessment / Plan / Recommendation History of Present Illness  67 y.o. male with PMH of DM, HTN, h/o CVA, PAD, anemia, seizure, CKD, s/p L BKA who was brought to the ED with complaints of confusion coughing and fever and chills over 2 days. He was inpatient 2 weeks ago for an I+D of a diabetic foot ulcer on the right lateral foot that was performed by Dr [name]. He was discharge to home on 08/15 on abx.   Patient was admitted with diag of HCAP, sepsis, with ?R foot infection, and anemia with a hgb of 6.    Clinical Impression  Pt admitted with the above. Pt currently with functional limitations due to the deficits listed below (see PT Problem List).  Limited due to fatigue.  Pt will benefit from skilled PT to increase their independence and safety with mobility to allow discharge to the venue listed below.      PT Assessment  Patient needs continued PT services    Follow Up Recommendations  Home health PT;Supervision/Assistance - 24 hour    Equipment Recommendations  None recommended by PT    Frequency Min 3X/week    Precautions / Restrictions Precautions Precautions: Fall Required Braces or Orthoses: Other Brace/Splint (left LE prosthesis) Other Brace/Splint: L Prosthetic LE.   Restrictions Weight Bearing Restrictions: Yes RLE Weight Bearing: Non weight bearing (per pt need to clarify)   Pertinent Vitals/Pain C/o right foot pain but does not rate      Mobility  Bed Mobility Bed Mobility: Supine to Sit;Sitting - Scoot to Edge of Bed Supine to Sit: 4: Min guard Sitting - Scoot to [employer] of Bed: 6: Modified independent (Device/Increase time) Details for Bed Mobility Assistance: minguard for safety Transfers Transfers: Sit to Stand;Stand to Sit Sit to Stand: 3: Mod assist;4: Min assist;From bed Stand to  Sit: 4: Min assist;Other (comment) (w/c) Details for Transfer Assistance: Increase (A) from lower surface with cues for hand placement.  Pt able to donn prosthesis with (A) to maintain balance while donning. Ambulation/Gait Ambulation/Gait Assistance: 4: Min assist Ambulation Distance (Feet): 2 Feet (side steps to w/c) Assistive device: Rolling walker Ambulation/Gait Assistance Details: Pt able to "hop" with left prosthesis while maintaining NWB on right LE. Gait Pattern: Step-to pattern Stairs: No    Exercises     PT Diagnosis: Difficulty walking;Generalized weakness;Acute pain  PT Problem List: Decreased strength;Decreased activity tolerance;Decreased range of motion;Decreased mobility;Pain PT Treatment Interventions: DME instruction;Gait training;Stair training;Functional mobility training;Therapeutic activities;Therapeutic exercise;Patient/family education;Wheelchair mobility training     PT Goals(Current goals can be found in the care plan section) Acute Rehab PT Goals Patient Stated Goal: Pt would like to return home. PT Goal Formulation: With patient Time For Goal Achievement: 12/07/12 Potential to Achieve Goals: Good  Visit Information  Last PT Received On: 11/23/12 Assistance Needed: +2 (safety with ambulation) History of Present Illness: 67 y.o. male with PMH of DM, HTN, h/o CVA, PAD, anemia, seizure, CKD, s/p L BKA who was brought to the ED with complaints of confusion coughing and fever and chills over 2 days. He was inpatient 2 weeks ago for an I+D of a diabetic foot ulcer on the right lateral foot that was performed by Dr [name]. He was discharge to home on 08/15 on abx.   Patient was admitted with diag of HCAP, sepsis, with ?R foot infection,  and anemia with a hgb of 6.         Prior [place] expects to be discharged to:: Private residence Living Arrangements: Spouse/significant other Available Help at Discharge: Family;Available 24  hours/day Type of Home: House Home Access: Stairs to enter [employer] of Steps: 2 Entrance Stairs-Rails: None Home Layout: One level Home Equipment: Bedside commode;Shower seat;Walker - 2 wheels;Wheelchair - manual Prior Function Level of Independence: Needs assistance Gait / Transfers Assistance Needed: Uses RW and prosthesis for ambulation ADL's / Homemaking Assistance Needed: wife helped pt with prosthetic Comments: Pt's bedroom setup at home allows RW to be secured and pt uses UEs to pull up to stand. Pt uses head resting against wall to assist with standing balance. Pt hops on LLE with RW to transfer. Pt also uses W/C part-time around home. Communication Communication: No difficulties    Cognition  Cognition Arousal/Alertness: Awake/alert Behavior During Therapy: Flat affect Overall Cognitive Status: Within Functional Limits for tasks assessed    Extremity/Trunk Assessment Lower Extremity Assessment RLE Deficits / Details: Pt reports pain in heel.  RLE Sensation: history of peripheral neuropathy   Balance Balance Balance Assessed: Yes Dynamic Sitting Balance Dynamic Sitting - Balance Support: Feet unsupported;No upper extremity supported;During functional activity Dynamic Sitting - Level of Assistance: 4: Min assist Dynamic Sitting Balance - Compensations: (A) to maintain balance while pt donns prosthesis.  End of Session PT - End of Session Equipment Utilized During Treatment: Gait belt Activity Tolerance: Patient tolerated treatment well Patient left: with call bell/phone within reach (in w/c) Nurse Communication: Mobility status  GP     Aicha Clingenpeel 11/23/2012, 4:57 PM  Antoine Poche, [place] DPT [phone]

## 2012-11-23 NOTE — Progress Notes (Signed)
[facility] Progress Note [place] TEAM 1 - Stepdown/ICU TEAM   [name]. [id number] DOB: [birth year] DOA: 11/16/2012 PCP: [name], MD  Admit HPI / Brief Narrative: 67 y.o. male with PMH of DM, HTN, h/o CVA, PAD, anemia, seizure, CKD, s/p L BKA who was brought to the ED with complaints of confusion coughing and fever and chills over 2 days. He was inpatient 2 weeks ago for an I+D of a diabetic foot ulcer on the right lateral foot that was performed by Dr [name]. He was discharge to home on 08/15 on abx.   Patient was admitted with diag of HCAP, sepsis, with ?R foot infection, and anemia with a hgb of 6.    Assessment/Plan:  Sepsis (WBC 13.8 + HR 97 + HCAP) - resolved -sepsis due to multifocal bilateral pneumonia v/s ? foot infection  -sepsis physiology has improved -pt is hemodynamically stable  HCAP - patchy multifocal airspace disease  -cont empiric abx tx -symptomatically improved - CXR stable in f/u on 9/3 - O2 requirements on decline - wean to RA today - f/u CXR in AM - approaching D/C soon   Right Foot Wounds / ?infection  -Dr [name] performed I+D on lateral foot ulcer of right foot 2 weeks prior to this admit -patient was on IV vanc and oral cipro prior to admission for foot infection - goal was to complete 4 weeks of abx tx total (as of 8/5) -Silvadene dressing to the right foot 3 times a week by advanced home care previously ordered per Ortho -elevated ESR,CRP not helpful due to probable active PNA  -per Ortho findings: MRI scan is more consistent with the Charcot arthropathy vs/ abcess as interpreted by the Radiologist -ongoing foot/wound care per Ortho - to f/u w/ [name] in office in ~7 days  CKD III likely DM nephropathy - crt is stable - baseline crt appears to be ~1.7 - crt is currently better than baseline   Altered Mental Status likely sepsis related; suspected metabolic toxic encephalopathy - resolved at this time   DM2 HBA1C 5.7 - CBG  remains poorly controlled - adjust tx plan again and follow   HTN BP reasonably controlled at this time   Anemia S/p 3 units PRBC - repeat Hg-5.8--->7.0--> HG-9.5 - likely anemia of chronic disease + sepsis - no s/s of acute bleeding and Hgb stable - anemia profile consistent with AOCD  Hx of Seizures Well compensated at present  Code Status: FULL Family Communication: no family present at time of exam Disposition Plan: stable for transfer to medical bed - begin PT/OT eval and tx - complete abx course    Consultants: Ortho [name]  Procedures: none  Antibiotics: Imipenem 8/31 >> Vanc 8/31 >>  DVT prophylaxis: SQ heparin  HPI/Subjective: Pt has no new complaints.  Is looking forward to D/C home.  Denies cp, sob, f/c, n/v, or abdom pain.    Objective: Blood pressure 131/59, pulse 101, temperature 98.6 F (37 C), temperature source Oral, resp. rate 24, height 5\' 10"  (1.778 m), weight 90 kg (198 lb 6.6 oz), SpO2 94.00%.  Intake/Output Summary (Last 24 hours) at 11/23/12 1202 Last data filed at 11/23/12 0427  Gross per 24 hour  Intake   1440 ml  Output    450 ml  Net    990 ml   Exam: General: No acute respiratory distress - sats 98% on 1L during exam  Lungs: clear B w/o wheeze or crackles on exam today  Cardiovascular: Regular rate and rhythm without murmur gallop or rub normal S1 and S2 Abdomen: Nontender, nondistended, soft, bowel sounds positive, no rebound, no ascites, no appreciable mass Extremities: s/p L LE amputation - R foot dressed and dry  Data Reviewed: Basic Metabolic Panel:  Recent Labs Lab 11/17/12 0500 11/18/12 0425 11/19/12 0700 11/20/12 0442 11/21/12 0500  NA 134* 134* 134* 143 136  K 3.7 4.2 3.8 4.2 4.1  CL 104 105 104 113* 107  CO2 20 20 18* 20 19  GLUCOSE 104* 197* 182* 111* 152*  BUN 22 24* 22 23 24*  CREATININE 1.58* 1.61* 1.56* 1.54* 1.48*  CALCIUM 7.8* 8.2* 8.3* 8.3* 8.4   Liver Function Tests:  Recent Labs Lab 11/16/12 2330   AST 35  ALT 35  ALKPHOS 97  BILITOT 0.3  PROT 7.2  ALBUMIN 2.1*   CBC:  Recent Labs Lab 11/16/12 2330 11/17/12 0500  11/18/12 0425 11/18/12 0949 11/19/12 0700 11/20/12 0442 11/21/12 0500  WBC 13.8* 13.0*  --  14.2*  --  14.1* 13.5* 13.0*  NEUTROABS 9.5*  --   --   --   --  10.5* 9.8*  --   HGB 6.3* 5.7*  < > 9.5* 9.5* 9.2* 9.2* 9.2*  HCT 19.1* 17.2*  < > 28.0* 27.2* 27.9* 27.2* 27.7*  MCV 86.4 86.0  --  85.9  --  85.8 85.5 86.8  PLT 390 359  --  335  --  351 339 332  < > = values in this interval not displayed. BNP (last 3 results)  Recent Labs  11/17/12 0016  PROBNP 4406.0*   CBG:  Recent Labs Lab 11/21/12 2203 11/22/12 0751 11/22/12 1133 11/22/12 1637 11/22/12 2140  GLUCAP 188* 230* 263* 211* 263*    Recent Results (from the past 240 hour(s))  CULTURE, BLOOD (ROUTINE X 2)     Status: None   Collection Time    11/17/12 12:40 AM      Result Value Range Status   Specimen Description BLOOD LEFT ARM   Final   Special Requests BOTTLES DRAWN AEROBIC ONLY 6CC   Final   Culture  Setup Time     Final   Value: 11/17/2012 04:24     Performed at [facility]   Culture     Final   Value:        BLOOD CULTURE RECEIVED NO GROWTH TO DATE CULTURE WILL BE HELD FOR 5 DAYS BEFORE ISSUING A FINAL NEGATIVE REPORT     Performed at [facility]   Report Status PENDING   Incomplete  CULTURE, BLOOD (ROUTINE X 2)     Status: None   Collection Time    11/17/12  2:58 AM      Result Value Range Status   Specimen Description BLOOD LEFT ARM   Final   Special Requests BOTTLES DRAWN AEROBIC ONLY 6CC   Final   Culture  Setup Time     Final   Value: 11/17/2012 16:34     Performed at [facility]   Culture     Final   Value:        BLOOD CULTURE RECEIVED NO GROWTH TO DATE CULTURE WILL BE HELD FOR 5 DAYS BEFORE ISSUING A FINAL NEGATIVE REPORT     Performed at [facility]   Report Status PENDING   Incomplete  MRSA PCR SCREENING     Status: None    Collection Time    11/17/12  5:00 AM  Result Value Range Status   MRSA by PCR NEGATIVE  NEGATIVE Final   Comment:            The GeneXpert MRSA Assay (FDA     approved for NASAL specimens     only), is one component of a     comprehensive MRSA colonization     surveillance program. It is not     intended to diagnose MRSA     infection nor to guide or     monitor treatment for     MRSA infections.     Studies:  Recent x-ray studies have been reviewed in detail by the Attending Physician  Scheduled Meds:  Scheduled Meds: . albuterol  2.5 mg Nebulization TID  . amLODipine  5 mg Oral Daily  . aspirin  325 mg Oral Daily  . atorvastatin  20 mg Oral Daily  . heparin subcutaneous  5,000 Units Subcutaneous Q8H  . imipenem-cilastatin  500 mg Intravenous Q8H  . insulin aspart  0-15 Units Subcutaneous TID WC  . insulin aspart  0-5 Units Subcutaneous QHS  . insulin aspart  6 Units Subcutaneous TID WC  . insulin glargine  24 Units Subcutaneous QHS  . latanoprost  1 drop Both Eyes QHS  . lisinopril  10 mg Oral Daily  . metoprolol  25 mg Oral BID  . phenytoin  200 mg Oral BID  . silver sulfADIAZINE   Topical Daily  . sodium chloride  1,000 mL Intravenous Once  . tamsulosin  0.4 mg Oral QPC supper  . timolol  1 drop Both Eyes Daily  . vancomycin  1,250 mg Intravenous Q24H    Time spent on care of this patient: 25 mins   [place]  [phone] Pager - Text Page per Shea Evans as per below:  On-Call/Text Page:      [email or web address]      password [email or web address]  If 7PM-7AM, please contact night-coverage [email or web address] Password [email or web address] 11/23/2012, 12:02 PM   LOS: 7 days

## 2012-11-23 NOTE — Progress Notes (Signed)
ANTIBIOTIC CONSULT NOTE - FOLLOW UP  Pharmacy Consult for Vancomycin Indication: pneumonia, ?Right foot infection  Allergies  Allergen Reactions  . Codeine Anaphylaxis  . Penicillins Anaphylaxis    Patient Measurements: Height: 5\' 10"  (177.8 cm) Weight: 198 lb 6.6 oz (90 kg) IBW/kg (Calculated) : 73  Vital Signs: Temp: 99.1 F (37.3 C) (09/06 0400) Temp src: Oral (09/06 0400) BP: 132/64 mmHg (09/06 0400) Pulse Rate: 91 (09/06 0400) Intake/Output from previous day: 09/05 0701 - 09/06 0700 In: 2280 [P.O.:1080; IV Piggyback:1200] Out: 975 [Urine:975] Intake/Output from this shift:    Labs:  Recent Labs  11/21/12 0500  WBC 13.0*  HGB 9.2*  PLT 332  CREATININE 1.48*   Estimated Creatinine Clearance: 56.2 ml/min (by C-G formula based on Cr of 1.48). No results found for this basename: VANCOTROUGH, Corlis Leak, VANCORANDOM, GENTTROUGH, GENTPEAK, GENTRANDOM, TOBRATROUGH, TOBRAPEAK, TOBRARND, AMIKACINPEAK, AMIKACINTROU, AMIKACIN,  in the last 72 hours   Microbiology: Recent Results (from the past 720 hour(s))  ANAEROBIC CULTURE     Status: None   Collection Time    10/28/12  5:23 PM      Result Value Range Status   Specimen Description ABSCESS FOOT RIGHT   Final   Special Requests NONE POF VANCOMYCIN CIPRO   Final   Gram Stain     Final   Value: NO WBC SEEN     NO SQUAMOUS EPITHELIAL CELLS SEEN     NO ORGANISMS SEEN     Performed at [facility]   Culture     Final   Value: NO ANAEROBES ISOLATED     Performed at [facility]   Report Status 11/01/2012 FINAL   Final  CULTURE, ROUTINE-ABSCESS     Status: None   Collection Time    10/28/12  5:23 PM      Result Value Range Status   Specimen Description ABSCESS FOOT RIGHT   Final   Special Requests NONE POF VANCOMYCIN CIPRO   Final   Gram Stain     Final   Value: NO WBC SEEN     NO SQUAMOUS EPITHELIAL CELLS SEEN     NO ORGANISMS SEEN     Performed at [facility]   Culture     Final   Value: MODERATE STAPHYLOCOCCUS AUREUS     Note: RIFAMPIN AND GENTAMICIN SHOULD NOT BE USED AS SINGLE DRUGS FOR TREATMENT OF STAPH INFECTIONS.     Performed at [facility]   Report Status 10/31/2012 FINAL   Final   Organism ID, Bacteria STAPHYLOCOCCUS AUREUS   Final  CULTURE, BLOOD (ROUTINE X 2)     Status: None   Collection Time    11/17/12 12:40 AM      Result Value Range Status   Specimen Description BLOOD LEFT ARM   Final   Special Requests BOTTLES DRAWN AEROBIC ONLY 6CC   Final   Culture  Setup Time     Final   Value: 11/17/2012 04:24     Performed at [facility]   Culture     Final   Value:        BLOOD CULTURE RECEIVED NO GROWTH TO DATE CULTURE WILL BE HELD FOR 5 DAYS BEFORE ISSUING A FINAL NEGATIVE REPORT     Performed at [facility]   Report Status PENDING   Incomplete  CULTURE, BLOOD (ROUTINE X 2)     Status: None   Collection Time    11/17/12  2:58 AM  Result Value Range Status   Specimen Description BLOOD LEFT ARM   Final   Special Requests BOTTLES DRAWN AEROBIC ONLY [facility]   Final   Culture  Setup Time     Final   Value: 11/17/2012 16:34     Performed at [facility]   Culture     Final   Value:        BLOOD CULTURE RECEIVED NO GROWTH TO DATE CULTURE WILL BE HELD FOR 5 DAYS BEFORE ISSUING A FINAL NEGATIVE REPORT     Performed at [facility]   Report Status PENDING   Incomplete  MRSA PCR SCREENING     Status: None   Collection Time    11/17/12  5:00 AM      Result Value Range Status   MRSA by PCR NEGATIVE  NEGATIVE Final   Comment:            The GeneXpert MRSA Assay (FDA     approved for NASAL specimens     only), is one component of a     comprehensive MRSA colonization     surveillance program. It is not     intended to diagnose MRSA     infection nor to guide or     monitor treatment for     MRSA infections.    Anti-infectives   Start     Dose/Rate Route Frequency Ordered Stop   11/19/12 1700  vancomycin  (VANCOCIN) 1,250 mg in sodium chloride 0.9 % 250 mL IVPB     1,250 mg 166.7 mL/hr over 90 Minutes Intravenous Every 24 hours 11/19/12 0842     11/17/12 1400  imipenem-cilastatin (PRIMAXIN) 500 mg in sodium chloride 0.9 % 100 mL IVPB  Status:  Discontinued     500 mg 200 mL/hr over 30 Minutes Intravenous 3 times per day 11/17/12 0315 11/17/12 0911   11/17/12 1130  imipenem-cilastatin (PRIMAXIN) 500 mg in sodium chloride 0.9 % 100 mL IVPB     500 mg 200 mL/hr over 30 Minutes Intravenous Every 8 hours 11/17/12 0939     11/17/12 0400  vancomycin (VANCOCIN) IVPB 750 mg/150 ml premix  Status:  Discontinued     750 mg 150 mL/hr over 60 Minutes Intravenous Every 12 hours 11/17/12 0345 11/19/12 0815   11/17/12 0200  imipenem-cilastatin (PRIMAXIN) 500 mg in sodium chloride 0.9 % 100 mL IVPB     500 mg 200 mL/hr over 30 Minutes Intravenous  Once 11/17/12 0156 11/17/12 0353   11/17/12 0200  vancomycin (VANCOCIN) IVPB 1000 mg/200 mL premix  Status:  Discontinued     1,000 mg 200 mL/hr over 60 Minutes Intravenous  Once 11/17/12 0156 11/17/12 0446      Assessment: [name] with HCAP, ?right foot infection. Continues on vancomycin/primaxin per pharmacy. WBC 13 on 9/4. Scr 1.48 with CrCl ~ 55 on 9/4. Tmax 99.2. All cultures have been negative. MD note from 9/5 with plans for 10 days total antibiotics.   Vancomycin 8/31>> Primaxin 8/31>>  Goal of Therapy:  Vancomycin trough level 15-20 mcg/ml  Plan:  -Vancomycin 1250mg  IV q24h -Vancomycin trough today at 1630 -Primaxin 500 mg IV q8h -Trend WBC, temp, renal function -F/U length of treatment, DC  Thank you for allowing me to take part in this patient's care,  Narda Bonds, PharmD Clinical Pharmacist Phone: [phone] Pager: [phone] 11/23/2012 8:37 AM

## 2012-11-23 NOTE — Progress Notes (Addendum)
Report called to [facility] on unit 6N. Attempt to call wife [name] @ [phone] and [phone] no answer. Patient made aware of transfer. Patient to be transferred via wheelchair to room 6N Rm 1. Wife returned call and made aware of transfer

## 2012-11-24 ENCOUNTER — Inpatient Hospital Stay (HOSPITAL_COMMUNITY): Payer: Medicare Other

## 2012-11-24 LAB — GLUCOSE, CAPILLARY
Glucose-Capillary: 108 mg/dL — ABNORMAL HIGH (ref 70–99)
Glucose-Capillary: 98 mg/dL (ref 70–99)
Glucose-Capillary: 172 mg/dL — ABNORMAL HIGH (ref 70–99)

## 2012-11-24 LAB — BASIC METABOLIC PANEL
Chloride: 109 mEq/L (ref 96–112)
Creatinine, Ser: 1.54 mg/dL — ABNORMAL HIGH (ref 0.50–1.35)
GFR calc Af Amer: 53 mL/min — ABNORMAL LOW (ref >90)
GFR calc non Af Amer: 46 mL/min — ABNORMAL LOW (ref >90)
Potassium: 3.9 mEq/L (ref 3.5–5.1)

## 2012-11-24 LAB — CBC
Platelets: 404 10*3/uL — ABNORMAL HIGH (ref 150–400)
RDW: 16.2 % — ABNORMAL HIGH (ref 11.5–15.5)
WBC: 12.9 10*3/uL — ABNORMAL HIGH (ref 4.0–10.5)

## 2012-11-24 MED ORDER — ALBUTEROL SULFATE (5 MG/ML) 0.5% IN NEBU
2.5000 mg | INHALATION_SOLUTION | Freq: Two times a day (BID) | RESPIRATORY_TRACT | Status: DC
  Administered 2012-11-24 – 2012-11-25 (×3): 2.5 mg via RESPIRATORY_TRACT
  Filled 2012-11-24 (×3): qty 0.5

## 2012-11-24 MED ORDER — INSULIN ASPART 100 UNIT/ML ~~LOC~~ SOLN
6.0000 [IU] | Freq: Three times a day (TID) | SUBCUTANEOUS | Status: DC
Start: 2012-11-24 — End: 2012-11-25
  Administered 2012-11-25: 6 [IU] via SUBCUTANEOUS

## 2012-11-24 MED ORDER — ALBUTEROL SULFATE (5 MG/ML) 0.5% IN NEBU: 2.5000 mg | INHALATION_SOLUTION | Freq: Four times a day (QID) | RESPIRATORY_TRACT | Status: DC | PRN

## 2012-11-24 MED ORDER — ALBUTEROL SULFATE (5 MG/ML) 0.5% IN NEBU: 2.5000 mg | INHALATION_SOLUTION | RESPIRATORY_TRACT | Status: DC | PRN

## 2012-11-24 NOTE — Progress Notes (Signed)
[facility] Progress Note  TEAM 1 - Stepdown/ICU TEAM   [name]. [id number] DOB: 1945-10-19 DOA: 11/16/2012 PCP: Devin Bolt, MD  Admit HPI / Brief Narrative: 67 y.o. male with PMH of DM, HTN, h/o CVA, PAD, anemia, seizure, CKD, s/p L BKA who was brought to the ED with complaints of confusion coughing and fever and chills over 2 days. He was inpatient 2 weeks ago for an I+D of a diabetic foot ulcer on the right lateral foot that was performed by Dr [name]. He was discharge to home on 08/15 on abx.   Patient was admitted with diag of HCAP, sepsis, with ?R foot infection, and anemia with a hgb of 6.    Assessment/Plan:  Sepsis (WBC 13.8 + HR 97 + HCAP) - resolved -sepsis due to multifocal bilateral pneumonia v/s ? foot infection  -sepsis physiology has improved -pt is hemodynamically stable  HCAP - patchy multifocal airspace disease  -has completed 8 days of empiric abx tx - will stop today  -symptomatically improved - CXR stable in f/u on 9/3, but worse on f/u today  - nonetheless, the pt is stable on RA today  - monitor off abx tx and follow  Transient confusion -pt was confused this AM upon waking - mental status cleared over following few hours - is alert and oriented at this time - likely simply mild sundowning - follow clinically   Right Foot Wounds / ?infection  -Dr [name] performed I+D on lateral foot ulcer of right foot 2 weeks prior to this admit -patient was on IV vanc and oral cipro prior to admission for foot infection - goal was to complete 4 weeks of abx tx total (as of 8/5) -Silvadene dressing to the right foot 3 times a week by advanced home care previously ordered per Ortho -elevated ESR,CRP not helpful due to probable active PNA  -per Ortho findings: MRI scan is more consistent with the Charcot arthropathy vs/ abcess as interpreted by the Radiologist -ongoing foot/wound care per Ortho - to f/u w/ [name] in office in ~7 days  CKD  III likely DM nephropathy - crt is stable - baseline crt appears to be ~1.7 - crt is currently better than baseline   Altered Mental Status - resolved  likely sepsis related; suspected metabolic toxic encephalopathy - resolved at this time   DM2 HBA1C 5.7 - CBG control much improved - follow w/o change today    HTN BP reasonably controlled at this time   Anemia S/p 3 units PRBC - repeat Hg-5.8--->7.0--> HG-9.5 - likely anemia of chronic disease + sepsis - no s/s of acute bleeding and Hgb stable - anemia profile consistent with AOCD  Hx of Seizures Well compensated at present  Code Status: FULL Family Communication: no family present at time of exam Disposition Plan: PT/OT eval and tx - complete abx course today - follow mental status  Consultants: Manson Passey [name]  Procedures: none  Antibiotics: Imipenem 8/31 >>9/7 Vanc 8/31 >>9/7  DVT prophylaxis: SQ heparin  HPI/Subjective: Pt was awoken from sleep this morning by staff, and was confused.  Did not know where he was.  Confusion cleared over next few hours.  Currently denies cp, sob, n/v, or abdom pain.    Objective: Blood pressure 116/53, pulse 94, temperature 98.4 F (36.9 C), temperature source Oral, resp. rate 16, height 5\' 10"  (1.778 m), weight 90 kg (198 lb 6.6 oz), SpO2 94.00%.  Intake/Output Summary (Last 24 hours) at 11/24/12 1218 Last  data filed at 11/24/12 1148  Gross per 24 hour  Intake    540 ml  Output    600 ml  Net    -60 ml   Exam: General: No acute respiratory distress - on RA at time of exam  Lungs: fine crackles th/o all fields - no wheeze    Cardiovascular: Regular rate and rhythm without murmur gallop or rub normal S1 and S2 Abdomen: Nontender, nondistended, soft, bowel sounds positive, no rebound, no ascites, no appreciable mass Extremities: s/p L LE amputation - R foot dressed and dry  Data Reviewed: Basic Metabolic Panel:  Recent Labs Lab 11/18/12 0425 11/19/12 0700 11/20/12 0442  11/21/12 0500 11/24/12 0523  NA 134* 134* 143 136 137  K 4.2 3.8 4.2 4.1 3.9  CL 105 104 113* 107 109  CO2 20 18* 20 19 19   GLUCOSE 197* 182* 111* 152* 125*  BUN 24* 22 23 24* 25*  CREATININE 1.61* 1.56* 1.54* 1.48* 1.54*  CALCIUM 8.2* 8.3* 8.3* 8.4 8.4   Liver Function Tests: No results found for this basename: AST, ALT, ALKPHOS, BILITOT, PROT, ALBUMIN,  in the last 168 hours  CBC:  Recent Labs Lab 11/18/12 0425 11/18/12 0949 11/19/12 0700 11/20/12 0442 11/21/12 0500 11/24/12 0523  WBC 14.2*  --  14.1* 13.5* 13.0* 12.9*  NEUTROABS  --   --  10.5* 9.8*  --   --   HGB 9.5* 9.5* 9.2* 9.2* 9.2* 9.3*  HCT 28.0* 27.2* 27.9* 27.2* 27.7* 28.2*  MCV 85.9  --  85.8 85.5 86.8 86.8  PLT 335  --  351 339 332 404*   BNP (last 3 results)  Recent Labs  11/17/12 0016  PROBNP 4406.0*   CBG:  Recent Labs Lab 11/23/12 0833 11/23/12 1223 11/23/12 1755 11/23/12 2114 11/24/12 0816  GLUCAP 207* 162* 148* 147* 108*    Recent Results (from the past 240 hour(s))  CULTURE, BLOOD (ROUTINE X 2)     Status: None   Collection Time    11/17/12 12:40 AM      Result Value Range Status   Specimen Description BLOOD LEFT ARM   Final   Special Requests BOTTLES DRAWN AEROBIC ONLY 6CC   Final   Culture  Setup Time     Final   Value: 11/17/2012 04:24     Performed at [facility]   Culture     Final   Value: NO GROWTH 5 DAYS     Performed at [facility]   Report Status 11/23/2012 FINAL   Final  CULTURE, BLOOD (ROUTINE X 2)     Status: None   Collection Time    11/17/12  2:58 AM      Result Value Range Status   Specimen Description BLOOD LEFT ARM   Final   Special Requests BOTTLES DRAWN AEROBIC ONLY [facility]   Final   Culture  Setup Time     Final   Value: 11/17/2012 16:34     Performed at [facility]   Culture     Final   Value: NO GROWTH 5 DAYS     Performed at [facility]   Report Status 11/23/2012 FINAL   Final  MRSA PCR SCREENING     Status: None    Collection Time    11/17/12  5:00 AM      Result Value Range Status   MRSA by PCR NEGATIVE  NEGATIVE Final   Comment:  The GeneXpert MRSA Assay (FDA     approved for NASAL specimens     only), is one component of a     comprehensive MRSA colonization     surveillance program. It is not     intended to diagnose MRSA     infection nor to guide or     monitor treatment for     MRSA infections.     Studies:  Recent x-ray studies have been reviewed in detail by the Attending Physician  Scheduled Meds:  Scheduled Meds: . albuterol  2.5 mg Nebulization BID  . amLODipine  5 mg Oral Daily  . aspirin  325 mg Oral Daily  . atorvastatin  20 mg Oral Daily  . heparin subcutaneous  5,000 Units Subcutaneous Q8H  . insulin aspart  0-15 Units Subcutaneous TID WC  . insulin aspart  0-5 Units Subcutaneous QHS  . insulin aspart  8 Units Subcutaneous TID WC  . insulin glargine  30 Units Subcutaneous QHS  . latanoprost  1 drop Both Eyes QHS  . lisinopril  10 mg Oral Daily  . metoprolol  25 mg Oral BID  . phenytoin  200 mg Oral BID  . silver sulfADIAZINE   Topical Daily  . tamsulosin  0.4 mg Oral QPC supper  . timolol  1 drop Both Eyes Daily  . vancomycin  1,000 mg Intravenous Q24H    Time spent on care of this patient: 25 mins   [place]  [phone] Pager - Text Page per Shea Evans as per below:  On-Call/Text Page:      [email or web address]      password [email or web address]  If 7PM-7AM, please contact night-coverage [email or web address] Password [email or web address] 11/24/2012, 12:18 PM   LOS: 8 days

## 2012-11-25 DIAGNOSIS — E162 Hypoglycemia, unspecified: Secondary | ICD-10-CM

## 2012-11-25 HISTORY — DX: Hypoglycemia, unspecified: E16.2

## 2012-11-25 LAB — CBC
Hemoglobin: 8.9 g/dL — ABNORMAL LOW (ref 13.0–17.0)
MCH: 28.5 pg (ref 26.0–34.0)
MCHC: 32.6 g/dL (ref 30.0–36.0)
MCV: 87.5 fL (ref 78.0–100.0)

## 2012-11-25 MED ORDER — SILVER SULFADIAZINE 1 % EX CREA: TOPICAL_CREAM | Freq: Every day | CUTANEOUS | Status: DC

## 2012-11-25 MED ORDER — AMLODIPINE BESYLATE 5 MG PO TABS: 5.0000 mg | ORAL_TABLET | Freq: Every day | ORAL | Status: DC

## 2012-11-25 MED ORDER — LISINOPRIL 10 MG PO TABS: 10.0000 mg | ORAL_TABLET | Freq: Every day | ORAL | Status: DC

## 2012-11-25 NOTE — Progress Notes (Signed)
Physical Therapy Treatment Patient Details Name: [name]. MRN: [id number] DOB: [birth year] Today's Date: 11/25/2012 Time: 1330-1410 PT Time Calculation (min): 40 min  PT Assessment / Plan / Recommendation  History of Present Illness 67 y.o. male with PMH of DM, HTN, h/o CVA, PAD, anemia, seizure, CKD, s/p L BKA who was brought to the ED with complaints of confusion coughing and fever and chills over 2 days. He was inpatient 2 weeks ago for an I+D of a diabetic foot ulcer on the right lateral foot that was performed by Dr [name]. He was discharge to home on 08/15 on abx.   Patient was admitted with diag of HCAP, sepsis, with ?R foot infection, and anemia with a hgb of 6.     PT Comments   Patient progressing with mobility. Required increased time for doning of prothesis this session. Patient able to perform pericare in sitting but required A in standing  Follow Up Recommendations  Home health PT;Supervision/Assistance - 24 hour     Does the patient have the potential to tolerate intense rehabilitation     Barriers to Discharge        Equipment Recommendations  None recommended by PT    Recommendations for Other Services    Frequency Min 3X/week   Progress towards PT Goals Progress towards PT goals: Progressing toward goals  Plan Current plan remains appropriate    Precautions / Restrictions Precautions Precautions: Fall Required Braces or Orthoses: Other Brace/Splint Other Brace/Splint: L Prosthetic LE.   Restrictions RLE Weight Bearing: Non weight bearing   Pertinent Vitals/Pain no apparent distress     Mobility  Bed Mobility Supine to Sit: 4: Min guard Transfers Transfers: Sit to Stand;Stand to Sit Sit to Stand: 3: Mod assist;4: Min assist;From bed;From chair/3-in-1 Stand to Sit: 4: Min assist;Other (comment) Details for Transfer Assistance: Increase (A) from lower surface with cues for hand placement.  Pt able to donn prosthesis with (A) to maintain balance while  donning. Ambulation/Gait Ambulation/Gait Assistance: Not tested (comment)    Exercises     PT Diagnosis:    PT Problem List:   PT Treatment Interventions:     PT Goals (current goals can now be found in the care plan section)    Visit Information  Last PT Received On: 11/25/12 Assistance Needed: +2 History of Present Illness: 67 y.o. male with PMH of DM, HTN, h/o CVA, PAD, anemia, seizure, CKD, s/p L BKA who was brought to the ED with complaints of confusion coughing and fever and chills over 2 days. He was inpatient 2 weeks ago for an I+D of a diabetic foot ulcer on the right lateral foot that was performed by Dr [name]. He was discharge to home on 08/15 on abx.   Patient was admitted with diag of HCAP, sepsis, with ?R foot infection, and anemia with a hgb of 6.      Subjective Data      Cognition  Cognition Arousal/Alertness: Awake/alert Behavior During Therapy: WFL for tasks assessed/performed Overall Cognitive Status: Within Functional Limits for tasks assessed    Balance     End of Session PT - End of Session Equipment Utilized During Treatment: Gait belt Activity Tolerance: Patient tolerated treatment well Patient left: with call bell/phone within reach Nurse Communication: Mobility status   GP     Jacqualyn Posey 11/25/2012, 2:59 PM 11/25/2012 Jacqualyn Posey PTA [phone] pager [phone] office

## 2012-11-25 NOTE — Discharge Summary (Signed)
DISCHARGE SUMMARY  [name].  MR#: [id number]  DOB:07-15-1945  Date of Admission: 11/16/2012 Date of Discharge: 11/25/2012  Attending Physician:MCCLUNG,JEFFREY T  Patient's GC:[id number] DENNIS, MD  Consults:  Mitzie Na   Disposition: D/C home  Follow-up Appts:     Follow-up Information   Follow up with DUDA,MARCUS V, MD In 1 week.   Specialty:  Orthopedic Surgery   Contact information:   [place] [address] [phone]       Follow up with [name], MD In 5 days.   Specialty:  Endocrinology   Contact information:   [address] [place] [address] [phone]      Wound Care: Wound care to defect at base of RGT (right great toe):  Cleanse with NS, pat gently dry. Cut a thin "ribbon" from piece of silver hydrofiber (Aquacel Ag+, LAwson [phone]) and tuck it gently into the defect.  Top with a single dry 2x2 inch gauze square (opened) and secure with tape.  Change daily and PRN loosening of dressing.  F/U issues: -BMET is suggested in 7-10 days due to recent new start of ACE -outpt f/u of BP is suggested as BP meds were adjusted (see below)  Discharge Diagnoses: Sepsis (WBC 13.8 + HR 97 + HCAP)  HCAP - patchy multifocal airspace disease  Right Foot Wounds / ?infection  CKD III  Altered Mental Status   DM2  HTN  Anemia  Hx of Seizures   Initial presentation: 67 y.o. male with PMH of DM, HTN, h/o CVA, PAD, anemia, seizure, CKD, s/p L BKA who was brought to the ED with complaints of confusion coughing and fever and chills over 2 days. He was inpatient 2 weeks ago for an I+D of a diabetic foot ulcer on the right lateral foot that was performed by Dr [name]. He was discharge to home on 08/15 on abx.   Patient was admitted with diag of HCAP, sepsis, with ?R foot infection, and anemia with a hgb of 6.   Hospital Course:  Sepsis (WBC 13.8 + HR 97 + HCAP)  -sepsis due to multifocal bilateral pneumonia  v/s ? foot infection  -sepsis physiology resolved -pt hemodynamically stable at time of d/c   HCAP - patchy multifocal airspace disease  -complete empiric abx for total of 10 days  -symptomatically improved - CXR stable in f/u on 9/3 - O2 sats normalized on RA  Right Foot Wounds / ?infection  -Dr [name] performed I+D on lateral foot ulcer of right foot 2 weeks prior to this admit  -patient was on IV Vanc prior to admission for foot infection, with plan to tx for a 6 week course with PICC Vanc and oral cipro (plan made on 8/12) - will resume these meds at time of d/c as 6 weeks has not yet been completed  -elevated ESR,CRP not helpful due to probable active PNA  -per Ortho findings: MRI scan is more consistent with the Charcot arthropathy vs/ abcess as interpreted by the Radiologist  -ongoing foot/wound care per Ortho orders at time of d/c  -to f/u w/ Dr. [name] within 7 days of d/c  -[facility] RN for wound care and monitoring + IV abx  CKD III  likely DM nephropathy - crt stable - baseline crt appears to be ~1.7   Altered Mental Status  likely sepsis related; suspected metabolic toxic encephalopathy - resolved at time of d/c   DM2  HBA1C-5.7 - CBG reasonably controlled at time  of D/C - pt confirms to MD that he was taking both 70/30 and lantus at home - will resume his usual regimen at D/C as A1c reflects good control   HTN  BP elevated - due to concurrent DM started Lisinopril - will need to follow crt in outpt setting   Anemia  S/p 3 units PRBC - repeat Hg-5.8--->7.0--> HG-9.5 - likely anemia of chronic disease + sepsis related bone marrow suppression  - no s/s of acute bleeding and Hgb stable  - anemia profile consistent with AOCD   Hx of Seizures  Well compensated at present     Medication List    STOP taking these medications       meloxicam 7.5 MG tablet  Commonly known as:  MOBIC      TAKE these medications       amLODipine 5 MG tablet  Commonly known as:  NORVASC  Take  1 tablet (5 mg total) by mouth daily.     aspirin 325 MG tablet  Take 325 mg by mouth daily.     atorvastatin 20 MG tablet  Commonly known as:  LIPITOR  Take 20 mg by mouth daily.     ciprofloxacin 250 MG tablet  Commonly known as:  CIPRO  Take 250 mg by mouth 2 (two) times daily.     guaiFENesin-dextromethorphan 100-10 MG/5ML syrup  Commonly known as:  ROBITUSSIN DM  Take 15 mLs by mouth every 4 (four) hours as needed for cough.     insulin aspart protamine- aspart (70-30) 100 UNIT/ML injection  Commonly known as:  NOVOLOG MIX 70/30  Inject 6-15 Units into the skin See admin instructions. Takes 15 units in the morning, and 6 units in the evening     insulin glargine 100 UNIT/ML injection  Commonly known as:  LANTUS  Inject 24 Units into the skin at bedtime.     lisinopril 10 MG tablet  Commonly known as:  PRINIVIL,ZESTRIL  Take 1 tablet (10 mg total) by mouth daily.     metoprolol 50 MG tablet  Commonly known as:  LOPRESSOR  Take 25 mg by mouth 2 (two) times daily.     phenytoin 200 MG ER capsule  Commonly known as:  DILANTIN  Take 1 capsule (200 mg total) by mouth 2 (two) times daily.     polyethylene glycol packet  Commonly known as:  MIRALAX / GLYCOLAX  Take 17 g by mouth daily as needed (constipation).     senna-docusate 8.6-50 MG per tablet  Commonly known as:  Senokot-S  Take 1 tablet by mouth daily.     silver sulfADIAZINE 1 % cream  Commonly known as:  SILVADENE  Apply topically daily.     sodium chloride 0.9 % injection  10-40 mL by Intracatheter route as needed (flush).     tamsulosin 0.4 MG Caps capsule  Commonly known as:  FLOMAX  Take 1 capsule (0.4 mg total) by mouth daily after supper.     timolol 0.5 % ophthalmic solution  Commonly known as:  BETIMOL  Place 1 drop into both eyes every morning.     traMADol 50 MG tablet  Commonly known as:  ULTRAM  Take 1 tablet (50 mg total) by mouth every 6 (six) hours as needed for pain. Maximum dose= 8  tablets per day     Travoprost (BAK Free) 0.004 % Soln ophthalmic solution  Commonly known as:  [name]  Place 1 drop into both eyes at bedtime.  VANCOMYCIN HCL IV  Inject 1,000 mg into the vein every 12 (twelve) hours.        Day of Discharge BP 134/88  Pulse 100  Temp(Src) 98.6 F (37 C) (Oral)  Resp 18  Ht 5\' 10"  (1.778 m)  Wt 90 kg (198 lb 6.6 oz)  BMI 28.47 kg/m2  SpO2 98%  Physical Exam: General: No acute respiratory distress Lungs: Clear to auscultation bilaterally without wheezes or crackles Cardiovascular: Regular rate and rhythm without murmur gallop or rub normal S1 and S2 Abdomen: Nontender, nondistended, soft, bowel sounds positive, no rebound, no ascites, no appreciable mass Extremities: No significant cyanosis, clubbing  CBC     Status: Abnormal   Collection Time    11/25/12  5:00 AM      Result Value Range   WBC 10.0  4.0 - 10.5 K/uL   RBC 3.12 (*) 4.22 - 5.81 MIL/uL   Hemoglobin 8.9 (*) 13.0 - 17.0 g/dL   HCT 27.3 (*) 39.0 - 52.0 %   MCV 87.5  78.0 - 100.0 fL   MCH 28.5  26.0 - 34.0 pg   MCHC 32.6  30.0 - 36.0 g/dL   RDW 16.4 (*) 11.5 - 15.5 %   Platelets 392  150 - 400 K/uL    Time spent in discharge (includes decision making & examination of pt): > 30 minutes  11/25/2012, 4:15 PM   Cherene Altes, MD [facility] Office  [phone] Pager [phone]  On-Call/Text Page:      [email or web address]      password [facility]

## 2012-11-25 NOTE — Progress Notes (Signed)
Discharge instructions reviewed with pt and pt's wife and prescriptions given.  Pt and pt's wife verbalized understanding and questions answered.  HH has been arranged.  Pt discharged in stable condition via wheelchair with wife.   Eliezer [place]

## 2012-11-25 NOTE — Progress Notes (Signed)
OT Cancellation Note  Patient Details Name: [name]. MRN: [id number] DOB: [birth year]   Cancelled Treatment:    Reason Eval/Treat Not Completed: Other (comment) (spoke with MD)  Spoke with MD and he is discharging pt and there is not a need for OT at this time.   Benito Mccreedy OTR/L [id number] 11/25/2012, 3:47 PM

## 2012-11-26 ENCOUNTER — Inpatient Hospital Stay (HOSPITAL_COMMUNITY)
Admission: EM | Admit: 2012-11-26 | Discharge: 2012-12-07 | DRG: 208 | Disposition: A | Discharge status: Home Health | Payer: Medicare Other | Attending: Internal Medicine | Admitting: Internal Medicine

## 2012-11-26 ENCOUNTER — Emergency Department (HOSPITAL_COMMUNITY): Payer: Medicare Other

## 2012-11-26 ENCOUNTER — Encounter (HOSPITAL_COMMUNITY): Payer: Self-pay | Admitting: Family Medicine

## 2012-11-26 DIAGNOSIS — L97509 Non-pressure chronic ulcer of other part of unspecified foot with unspecified severity: Secondary | ICD-10-CM | POA: Diagnosis present

## 2012-11-26 DIAGNOSIS — N183 Chronic kidney disease, stage 3 unspecified: Secondary | ICD-10-CM | POA: Diagnosis present

## 2012-11-26 DIAGNOSIS — D649 Anemia, unspecified: Secondary | ICD-10-CM | POA: Diagnosis present

## 2012-11-26 DIAGNOSIS — E8809 Other disorders of plasma-protein metabolism, not elsewhere classified: Secondary | ICD-10-CM | POA: Diagnosis not present

## 2012-11-26 DIAGNOSIS — Z79899 Other long term (current) drug therapy: Secondary | ICD-10-CM

## 2012-11-26 DIAGNOSIS — D638 Anemia in other chronic diseases classified elsewhere: Secondary | ICD-10-CM | POA: Diagnosis present

## 2012-11-26 DIAGNOSIS — I739 Peripheral vascular disease, unspecified: Secondary | ICD-10-CM | POA: Diagnosis present

## 2012-11-26 DIAGNOSIS — L8990 Pressure ulcer of unspecified site, unspecified stage: Secondary | ICD-10-CM | POA: Diagnosis not present

## 2012-11-26 DIAGNOSIS — E1169 Type 2 diabetes mellitus with other specified complication: Secondary | ICD-10-CM | POA: Diagnosis present

## 2012-11-26 DIAGNOSIS — I129 Hypertensive chronic kidney disease with stage 1 through stage 4 chronic kidney disease, or unspecified chronic kidney disease: Secondary | ICD-10-CM | POA: Diagnosis present

## 2012-11-26 DIAGNOSIS — Z8673 Personal history of transient ischemic attack (TIA), and cerebral infarction without residual deficits: Secondary | ICD-10-CM

## 2012-11-26 DIAGNOSIS — M869 Osteomyelitis, unspecified: Secondary | ICD-10-CM | POA: Diagnosis present

## 2012-11-26 DIAGNOSIS — G40909 Epilepsy, unspecified, not intractable, without status epilepticus: Secondary | ICD-10-CM | POA: Diagnosis present

## 2012-11-26 DIAGNOSIS — Z7982 Long term (current) use of aspirin: Secondary | ICD-10-CM

## 2012-11-26 DIAGNOSIS — X31XXXA Exposure to excessive natural cold, initial encounter: Secondary | ICD-10-CM | POA: Diagnosis present

## 2012-11-26 DIAGNOSIS — I509 Heart failure, unspecified: Secondary | ICD-10-CM | POA: Diagnosis present

## 2012-11-26 DIAGNOSIS — I428 Other cardiomyopathies: Secondary | ICD-10-CM | POA: Diagnosis present

## 2012-11-26 DIAGNOSIS — E162 Hypoglycemia, unspecified: Secondary | ICD-10-CM | POA: Insufficient documentation

## 2012-11-26 DIAGNOSIS — I5021 Acute systolic (congestive) heart failure: Secondary | ICD-10-CM | POA: Clinically undetermined

## 2012-11-26 DIAGNOSIS — J9601 Acute respiratory failure with hypoxia: Secondary | ICD-10-CM | POA: Diagnosis not present

## 2012-11-26 DIAGNOSIS — I10 Essential (primary) hypertension: Secondary | ICD-10-CM | POA: Diagnosis present

## 2012-11-26 DIAGNOSIS — M14679 Charcot's joint, unspecified ankle and foot: Secondary | ICD-10-CM | POA: Diagnosis present

## 2012-11-26 DIAGNOSIS — M9080 Osteopathy in diseases classified elsewhere, unspecified site: Secondary | ICD-10-CM | POA: Diagnosis present

## 2012-11-26 DIAGNOSIS — G9340 Encephalopathy, unspecified: Secondary | ICD-10-CM | POA: Diagnosis not present

## 2012-11-26 DIAGNOSIS — A419 Sepsis, unspecified organism: Secondary | ICD-10-CM

## 2012-11-26 DIAGNOSIS — E872 Acidosis, unspecified: Secondary | ICD-10-CM | POA: Diagnosis not present

## 2012-11-26 DIAGNOSIS — I5043 Acute on chronic combined systolic (congestive) and diastolic (congestive) heart failure: Secondary | ICD-10-CM | POA: Diagnosis present

## 2012-11-26 DIAGNOSIS — N179 Acute kidney failure, unspecified: Secondary | ICD-10-CM | POA: Diagnosis present

## 2012-11-26 DIAGNOSIS — E119 Type 2 diabetes mellitus without complications: Secondary | ICD-10-CM | POA: Diagnosis present

## 2012-11-26 DIAGNOSIS — T68XXXA Hypothermia, initial encounter: Secondary | ICD-10-CM | POA: Diagnosis present

## 2012-11-26 DIAGNOSIS — S78119A Complete traumatic amputation at level between unspecified hip and knee, initial encounter: Secondary | ICD-10-CM

## 2012-11-26 DIAGNOSIS — N184 Chronic kidney disease, stage 4 (severe): Secondary | ICD-10-CM | POA: Diagnosis present

## 2012-11-26 DIAGNOSIS — Z794 Long term (current) use of insulin: Secondary | ICD-10-CM

## 2012-11-26 DIAGNOSIS — Z23 Encounter for immunization: Secondary | ICD-10-CM

## 2012-11-26 DIAGNOSIS — L97519 Non-pressure chronic ulcer of other part of right foot with unspecified severity: Secondary | ICD-10-CM | POA: Diagnosis present

## 2012-11-26 DIAGNOSIS — J9600 Acute respiratory failure, unspecified whether with hypoxia or hypercapnia: Secondary | ICD-10-CM

## 2012-11-26 DIAGNOSIS — A5211 Tabes dorsalis: Secondary | ICD-10-CM | POA: Diagnosis present

## 2012-11-26 DIAGNOSIS — I5032 Chronic diastolic (congestive) heart failure: Secondary | ICD-10-CM | POA: Clinically undetermined

## 2012-11-26 DIAGNOSIS — L89609 Pressure ulcer of unspecified heel, unspecified stage: Secondary | ICD-10-CM | POA: Diagnosis not present

## 2012-11-26 DIAGNOSIS — J189 Pneumonia, unspecified organism: Secondary | ICD-10-CM | POA: Diagnosis present

## 2012-11-26 DIAGNOSIS — N400 Enlarged prostate without lower urinary tract symptoms: Secondary | ICD-10-CM

## 2012-11-26 HISTORY — DX: Peripheral vascular disease, unspecified: I73.9

## 2012-11-26 HISTORY — DX: Benign prostatic hyperplasia without lower urinary tract symptoms: N40.0

## 2012-11-26 HISTORY — DX: Polyp of colon: K63.5

## 2012-11-26 HISTORY — DX: Hypoglycemia, unspecified: E16.2

## 2012-11-26 HISTORY — DX: Osteomyelitis, unspecified: M86.9

## 2012-11-26 HISTORY — DX: Sepsis, unspecified organism: A41.9

## 2012-11-26 LAB — COMPREHENSIVE METABOLIC PANEL
Albumin: 1.8 g/dL — ABNORMAL LOW (ref 3.5–5.2)
BUN: 21 mg/dL (ref 6–23)
Calcium: 8.4 mg/dL (ref 8.4–10.5)
Creatinine, Ser: 1.46 mg/dL — ABNORMAL HIGH (ref 0.50–1.35)
Potassium: 4.3 mEq/L (ref 3.5–5.1)
Total Protein: 6.5 g/dL (ref 6.0–8.3)

## 2012-11-26 LAB — GLUCOSE, CAPILLARY
Glucose-Capillary: 111 mg/dL — ABNORMAL HIGH (ref 70–99)
Glucose-Capillary: 188 mg/dL — ABNORMAL HIGH (ref 70–99)
Glucose-Capillary: 191 mg/dL — ABNORMAL HIGH (ref 70–99)
Glucose-Capillary: 204 mg/dL — ABNORMAL HIGH (ref 70–99)

## 2012-11-26 LAB — URINALYSIS, ROUTINE W REFLEX MICROSCOPIC
Glucose, UA: NEGATIVE mg/dL
Specific Gravity, Urine: 1.014 (ref 1.005–1.030)
pH: 5 (ref 5.0–8.0)

## 2012-11-26 LAB — CBC WITH DIFFERENTIAL/PLATELET
Basophils Relative: 0 % (ref 0–1)
Eosinophils Absolute: 0.5 10*3/uL (ref 0.0–0.7)
Hemoglobin: 10 g/dL — ABNORMAL LOW (ref 13.0–17.0)
MCH: 28.6 pg (ref 26.0–34.0)
MCHC: 32.6 g/dL (ref 30.0–36.0)
Monocytes Absolute: 1 10*3/uL (ref 0.1–1.0)
Monocytes Relative: 7 % (ref 3–12)
Neutrophils Relative %: 84 % — ABNORMAL HIGH (ref 43–77)

## 2012-11-26 LAB — URINE MICROSCOPIC-ADD ON

## 2012-11-26 MED ORDER — ATORVASTATIN CALCIUM 20 MG PO TABS
20.0000 mg | ORAL_TABLET | Freq: Every day | ORAL | Status: DC
  Administered 2012-11-26 – 2012-11-29 (×4): 20 mg via ORAL
  Filled 2012-11-26 (×4): qty 1

## 2012-11-26 MED ORDER — AMLODIPINE BESYLATE 5 MG PO TABS
5.0000 mg | ORAL_TABLET | Freq: Every day | ORAL | Status: DC
  Administered 2012-11-27 – 2012-11-29 (×3): 5 mg via ORAL
  Filled 2012-11-26 (×3): qty 1

## 2012-11-26 MED ORDER — BENZONATATE 100 MG PO CAPS
100.0000 mg | ORAL_CAPSULE | Freq: Three times a day (TID) | ORAL | Status: DC | PRN
  Administered 2012-11-26: 18:00:00 100 mg via ORAL
  Filled 2012-11-26 (×3): qty 1

## 2012-11-26 MED ORDER — SODIUM CHLORIDE 0.9 % IV SOLN
1000.0000 mL | INTRAVENOUS | Status: DC
  Administered 2012-11-27 – 2012-11-29 (×4): 1000 mL via INTRAVENOUS

## 2012-11-26 MED ORDER — LATANOPROST 0.005 % OP SOLN
1.0000 [drp] | Freq: Every day | OPHTHALMIC | Status: DC
  Administered 2012-11-26 – 2012-12-06 (×11): 1 [drp] via OPHTHALMIC
  Filled 2012-11-26: qty 2.5

## 2012-11-26 MED ORDER — DEXTROSE 5 % IV SOLN
2.0000 g | Freq: Three times a day (TID) | INTRAVENOUS | Status: DC
  Administered 2012-11-26 – 2012-11-29 (×9): 2 g via INTRAVENOUS
  Filled 2012-11-26 (×14): qty 2

## 2012-11-26 MED ORDER — SODIUM CHLORIDE 0.9 % IV SOLN
500.0000 mg | INTRAVENOUS | Status: AC
  Administered 2012-11-26: 17:00:00 500 mg via INTRAVENOUS
  Filled 2012-11-26: qty 500

## 2012-11-26 MED ORDER — ALBUTEROL SULFATE (5 MG/ML) 0.5% IN NEBU
2.5000 mg | INHALATION_SOLUTION | RESPIRATORY_TRACT | Status: DC | PRN
  Administered 2012-11-28 (×2): 2.5 mg via RESPIRATORY_TRACT
  Filled 2012-11-26 (×2): qty 0.5

## 2012-11-26 MED ORDER — TIMOLOL MALEATE 0.5 % OP SOLN
1.0000 [drp] | Freq: Every day | OPHTHALMIC | Status: DC
  Administered 2012-11-27 – 2012-12-07 (×11): 1 [drp] via OPHTHALMIC
  Filled 2012-11-26 (×2): qty 5

## 2012-11-26 MED ORDER — TAMSULOSIN HCL 0.4 MG PO CAPS
0.4000 mg | ORAL_CAPSULE | Freq: Every day | ORAL | Status: DC
  Administered 2012-11-26 – 2012-12-07 (×11): 0.4 mg via ORAL
  Filled 2012-11-26 (×13): qty 1

## 2012-11-26 MED ORDER — SODIUM CHLORIDE 0.9 % IV SOLN
1000.0000 mL | Freq: Once | INTRAVENOUS | Status: AC
  Administered 2012-11-26: 1000 mL via INTRAVENOUS

## 2012-11-26 MED ORDER — ONDANSETRON HCL 4 MG PO TABS: 4.0000 mg | ORAL_TABLET | Freq: Four times a day (QID) | ORAL | Status: DC | PRN

## 2012-11-26 MED ORDER — METOPROLOL TARTRATE 25 MG PO TABS
25.0000 mg | ORAL_TABLET | Freq: Two times a day (BID) | ORAL | Status: DC
  Administered 2012-11-26 – 2012-11-29 (×6): 25 mg via ORAL
  Filled 2012-11-26 (×8): qty 1

## 2012-11-26 MED ORDER — TIMOLOL HEMIHYDRATE 0.5 % OP SOLN: 1.0000 [drp] | Freq: Every morning | OPHTHALMIC | Status: DC

## 2012-11-26 MED ORDER — VANCOMYCIN HCL IN DEXTROSE 1-5 GM/200ML-% IV SOLN
1000.0000 mg | Freq: Once | INTRAVENOUS | Status: AC
  Administered 2012-11-26: 1000 mg via INTRAVENOUS
  Filled 2012-11-26: qty 200

## 2012-11-26 MED ORDER — INSULIN GLARGINE 100 UNIT/ML ~~LOC~~ SOLN
24.0000 [IU] | Freq: Every day | SUBCUTANEOUS | Status: DC
  Administered 2012-11-26 – 2012-11-27 (×2): 24 [IU] via SUBCUTANEOUS
  Filled 2012-11-26 (×3): qty 0.24

## 2012-11-26 MED ORDER — ONDANSETRON HCL 4 MG/2ML IJ SOLN
4.0000 mg | Freq: Four times a day (QID) | INTRAMUSCULAR | Status: DC | PRN
  Administered 2012-11-29: 15:00:00 4 mg via INTRAVENOUS
  Filled 2012-11-26 (×2): qty 2

## 2012-11-26 MED ORDER — ACETAMINOPHEN 325 MG PO TABS
650.0000 mg | ORAL_TABLET | Freq: Four times a day (QID) | ORAL | Status: DC | PRN
  Administered 2012-12-02: 650 mg via ORAL
  Filled 2012-11-26: qty 2

## 2012-11-26 MED ORDER — SODIUM CHLORIDE 0.9 % IV SOLN
1000.0000 mL | INTRAVENOUS | Status: DC
  Administered 2012-11-26: 1000 mL via INTRAVENOUS

## 2012-11-26 MED ORDER — ACETAMINOPHEN 650 MG RE SUPP: 650.0000 mg | Freq: Four times a day (QID) | RECTAL | Status: DC | PRN

## 2012-11-26 MED ORDER — SILVER SULFADIAZINE 1 % EX CREA
TOPICAL_CREAM | Freq: Every day | CUTANEOUS | Status: DC
  Administered 2012-11-27 – 2012-12-03 (×7): via TOPICAL
  Administered 2012-12-04 – 2012-12-05 (×2): 1 via TOPICAL
  Administered 2012-12-06 – 2012-12-07 (×2): via TOPICAL
  Filled 2012-11-26: qty 85

## 2012-11-26 MED ORDER — INSULIN ASPART 100 UNIT/ML ~~LOC~~ SOLN
0.0000 [IU] | Freq: Every day | SUBCUTANEOUS | Status: DC
  Administered 2012-11-26: 2 [IU] via SUBCUTANEOUS

## 2012-11-26 MED ORDER — INFLUENZA VAC SPLIT QUAD 0.5 ML IM SUSP
0.5000 mL | INTRAMUSCULAR | Status: AC
  Administered 2012-11-27: 16:00:00 0.5 mL via INTRAMUSCULAR
  Filled 2012-11-26 (×2): qty 0.5

## 2012-11-26 MED ORDER — PNEUMOCOCCAL VAC POLYVALENT 25 MCG/0.5ML IJ INJ
0.5000 mL | INJECTION | INTRAMUSCULAR | Status: AC
  Administered 2012-11-27: 12:00:00 0.5 mL via INTRAMUSCULAR
  Filled 2012-11-26: qty 0.5

## 2012-11-26 MED ORDER — VANCOMYCIN HCL IN DEXTROSE 1-5 GM/200ML-% IV SOLN
1000.0000 mg | INTRAVENOUS | Status: DC
  Administered 2012-11-27 – 2012-12-07 (×11): 1000 mg via INTRAVENOUS
  Filled 2012-11-26 (×13): qty 200

## 2012-11-26 MED ORDER — POLYETHYLENE GLYCOL 3350 17 G PO PACK
17.0000 g | PACK | Freq: Every day | ORAL | Status: DC | PRN
  Filled 2012-11-26: qty 1

## 2012-11-26 MED ORDER — DEXTROSE-NACL 5-0.9 % IV SOLN: INTRAVENOUS | Status: DC

## 2012-11-26 MED ORDER — ASPIRIN 325 MG PO TABS
325.0000 mg | ORAL_TABLET | Freq: Every day | ORAL | Status: DC
  Administered 2012-11-26 – 2012-12-07 (×12): 325 mg via ORAL
  Filled 2012-11-26 (×12): qty 1

## 2012-11-26 MED ORDER — IPRATROPIUM BROMIDE 0.02 % IN SOLN
0.5000 mg | RESPIRATORY_TRACT | Status: DC | PRN
  Administered 2012-11-28 (×2): 0.5 mg via RESPIRATORY_TRACT
  Filled 2012-11-26 (×2): qty 2.5

## 2012-11-26 MED ORDER — SENNOSIDES-DOCUSATE SODIUM 8.6-50 MG PO TABS
1.0000 | ORAL_TABLET | Freq: Every day | ORAL | Status: DC
  Administered 2012-11-28 – 2012-11-29 (×2): 1 via ORAL
  Filled 2012-11-26 (×2): qty 1

## 2012-11-26 MED ORDER — HEPARIN SODIUM (PORCINE) 5000 UNIT/ML IJ SOLN
5000.0000 [IU] | Freq: Three times a day (TID) | INTRAMUSCULAR | Status: DC
  Administered 2012-11-26 – 2012-12-07 (×32): 5000 [IU] via SUBCUTANEOUS
  Filled 2012-11-26 (×36): qty 1

## 2012-11-26 MED ORDER — PHENYTOIN SODIUM EXTENDED 100 MG PO CAPS
300.0000 mg | ORAL_CAPSULE | Freq: Two times a day (BID) | ORAL | Status: DC
  Administered 2012-11-26 – 2012-11-28 (×5): 300 mg via ORAL
  Filled 2012-11-26 (×9): qty 3

## 2012-11-26 MED ORDER — INSULIN ASPART 100 UNIT/ML ~~LOC~~ SOLN
0.0000 [IU] | Freq: Three times a day (TID) | SUBCUTANEOUS | Status: DC
  Administered 2012-11-26: 3 [IU] via SUBCUTANEOUS
  Administered 2012-11-27 – 2012-11-28 (×4): 2 [IU] via SUBCUTANEOUS
  Administered 2012-11-29 (×2): 3 [IU] via SUBCUTANEOUS
  Administered 2012-11-29: 2 [IU] via SUBCUTANEOUS

## 2012-11-26 NOTE — ED Notes (Signed)
Notified RN of CBG 166 

## 2012-11-26 NOTE — ED Notes (Addendum)
Pt presents from home via [facility]. Pt's family called EMS when pt had CBG of 40 after taking daily insulin and eating sandwich/juice. Pt started Amlodipine and Lisinopril today.  EMS obtained CBG of 26 upon arrival, gave Glucagon, 233mL D10, [place], and 3 glasses of Orange Juice. Obtained CBG 110 just after intervention.  EMS re-took CBG just PTA and obtain CBG of 104. Per EMS pt remained alert and intermittently disoriented during their time with pt. Pt recently d/c'd from hospital for pneumonia.

## 2012-11-26 NOTE — Progress Notes (Signed)
Pt admitted to [facility] from [facility] ED. Pt is alert and oriented to staff, call bell, and room. Bed in lowest position. Call bell within reach. Full assessment to Epic. Will continue to monitor. Henry Russel, RN

## 2012-11-26 NOTE — ED Provider Notes (Signed)
Medical screening examination/treatment/procedure(s) were conducted as a shared visit with non-physician practitioner(s) and myself.  I personally evaluated the patient during the encounter and agree with plan of care and physical exam.  Pt is a 67 y.o. male with a history of insulin-dependent diabetes, seizure disorder, hyperlipidemia, hypertension who had recent admission to the hospital for pneumonia and sepsis. Patient was discharged home yesterday and is coming back to the emergency department with hypoglycemia and hypothermia. He also has leukocytosis that is increased since discharge. Chest x-ray shows persistent infiltrates. Concern for continued sepsis. Will give broad coverage antibiotics and admitted to the hospital. He is otherwise hemodynamically stable.  [place], DO 11/26/12 1551

## 2012-11-26 NOTE — H&P (Signed)
[facility] History and Physical  [name]. [id number] DOB: 1945-12-31 DOA: 11/26/2012  Referring physician: Emergency Department PCP: [name], MD  Specialists:   Chief Complaint: Hypoglycemia  HPI: [name]. is a 67 y.o. male  Who was recently discharged from the hospital for PNA. See discharge summary dated yesterday (11/25/12). Since discharge, the patient continued on his 70/30 and lantus insulins. He was noted to be profoundly hypoglycemic on the morning of admit with bs as low as the 20's. The patient received glucagon and orange juice with improvement of blood sugars. He was brought to the ED with improved BS. However, the patient was also found to be mildly hypothermic. The hospitalist service was consulted for admission.  Review of Systems: Per above, remainder of 10pt ros reviewed and are neg  Past Medical History  Diagnosis Date  . Diabetes mellitus   . Hypertension   . Seizures   . Stroke   . Peripheral vascular disease   . CKD (chronic kidney disease)     Stage II (per 06/2010 notes)  . Neuromuscular disorder   . Pneumonia   . Anemia    Past Surgical History  Procedure Laterality Date  . Colon surgery      partial colectomy  . Eye surgery      bilat cataract surgery  . Below knee leg amputation      L  . Toe amputation      R two  . Amputation  10/10/2011    Procedure: AMPUTATION DIGIT;  Surgeon: Newt Minion, MD;  Location: [facility];  Service: Orthopedics;  Laterality: Right;  Right Foot 4th Toe Amputation  . Orif toe fracture Right 10/09/2012    Procedure: OPEN REDUCTION INTERNAL FIXATION (ORIF) METATARSAL (TOE) FRACTURE;  Surgeon: Newt Minion, MD;  Location: [facility];  Service: Orthopedics;  Laterality: Right;  Right Foot Base 1st Metatarsal and Medial Cuneoform Excision, Internal Fixation, Antibiotic Beads  . I&d extremity Right 10/28/2012    Procedure: IRRIGATION AND DEBRIDEMENT EXTREMITY;  Surgeon: Newt Minion, MD;  Location:  [facility];  Service: Orthopedics;  Laterality: Right;  Irrigation and Debridement Right Foot, Remove Deep Hardware, Place Antibiotic Beads  . Vascular surgery     Social History:  reports that he has never smoked. He has never used smokeless tobacco. He reports that  drinks alcohol. He reports that he does not use illicit drugs.  where does patient live--home, ALF, SNF? and with whom if at home?  Can patient participate in ADLs?  Allergies  Allergen Reactions  . Codeine Anaphylaxis  . Penicillins Anaphylaxis    History reviewed. No pertinent family history.  (be sure to complete)  Prior to Admission medications   Medication Sig Start Date End Date Taking? Authorizing Provider  amLODipine (NORVASC) 5 MG tablet Take 1 tablet (5 mg total) by mouth daily. 11/25/12  Yes Cherene Altes, MD  aspirin 325 MG tablet Take 325 mg by mouth daily.   Yes Historical Provider, MD  atorvastatin (LIPITOR) 20 MG tablet Take 20 mg by mouth daily.   Yes Historical Provider, MD  ciprofloxacin (CIPRO) 250 MG tablet Take 250 mg by mouth 2 (two) times daily. 11/11/12 12/09/12 Yes Historical Provider, MD  insulin aspart protamine- aspart (NOVOLOG MIX 70/30) (70-30) 100 UNIT/ML injection Inject 6-15 Units into the skin See admin instructions. Takes 15 units in the morning, and 6 units in the evening 11/01/12  Yes Ripudeep Krystal Eaton, MD  insulin glargine (LANTUS)  100 UNIT/ML injection Inject 24 Units into the skin at bedtime.   Yes Historical Provider, MD  lisinopril (PRINIVIL,ZESTRIL) 10 MG tablet Take 1 tablet (10 mg total) by mouth daily. 11/25/12  Yes Cherene Altes, MD  metoprolol (LOPRESSOR) 50 MG tablet Take 25 mg by mouth 2 (two) times daily.   Yes Historical Provider, MD  phenytoin (DILANTIN) 200 MG ER capsule Take 600 mg by mouth at bedtime.   Yes Historical Provider, MD  senna-docusate (SENOKOT-S) 8.6-50 MG per tablet Take 1 tablet by mouth daily. 11/01/12  Yes Ripudeep Krystal Eaton, MD  silver sulfADIAZINE (SILVADENE) 1 %  cream Apply topically daily. 11/25/12  Yes Cherene Altes, MD  sodium chloride 0.9 % injection 10-40 mL by Intracatheter route as needed (flush). 11/01/12  Yes Ripudeep Krystal Eaton, MD  tamsulosin (FLOMAX) 0.4 MG CAPS capsule Take 1 capsule (0.4 mg total) by mouth daily after supper. 11/01/12  Yes Ripudeep Krystal Eaton, MD  timolol (BETIMOL) 0.5 % ophthalmic solution Place 1 drop into both eyes every morning.    Yes Historical Provider, MD  Travoprost, BAK Free, ([name]) 0.004 % SOLN ophthalmic solution Place 1 drop into both eyes at bedtime.   Yes Historical Provider, MD  polyethylene glycol (MIRALAX / GLYCOLAX) packet Take 17 g by mouth daily as needed (constipation). 11/01/12   Ripudeep Krystal Eaton, MD  traMADol (ULTRAM) 50 MG tablet Take 1 tablet (50 mg total) by mouth every 6 (six) hours as needed for pain. Maximum dose= 8 tablets per day 10/10/12   Newt Minion, MD  VANCOMYCIN HCL IV Inject 1,000 mg into the vein every 12 (twelve) hours. 11/11/12 12/09/12  Historical Provider, MD   Physical Exam: Filed Vitals:   11/26/12 1200 11/26/12 1215 11/26/12 1230 11/26/12 1330  BP: 131/72 130/68 135/69   Pulse: 75 80 77 84  Temp:    95.9 F (35.5 C)  TempSrc:    Rectal  Resp: 18 18 11 18   SpO2: 100% 97% 98% 96%     General:  Awake, in nad  Eyes: PERRL B  ENT: Membranes moist, dentition fair  Neck: trachea midline, neck supple   Cardiovascular: regular, s1, s2  Respiratory: normal resp effort, no wheezing  Abdomen: soft, nondistneded  Skin: normal skin turgor, no abnormal skin lesions seen  Musculoskeletal: perfused, no clubbing  Psychiatric: mood/affect normal // no auditory/visual hallucinations  Neurologic: cn2-12 grossly intact, strength/sensation intact  Labs on Admission:  Basic Metabolic Panel:  Recent Labs Lab 11/20/12 0442 11/21/12 0500 11/24/12 0523 11/26/12 1215  NA 143 136 137 135  K 4.2 4.1 3.9 4.3  CL 113* 107 109 105  CO2 20 19 19 20   GLUCOSE 111* 152* 125* 190*  BUN 23  24* 25* 21  CREATININE 1.54* 1.48* 1.54* 1.46*  CALCIUM 8.3* 8.4 8.4 8.4   Liver Function Tests:  Recent Labs Lab 11/26/12 1215  AST 23  ALT 17  ALKPHOS 113  BILITOT 0.2*  PROT 6.5  ALBUMIN 1.8*   No results found for this basename: LIPASE, AMYLASE,  in the last 168 hours No results found for this basename: AMMONIA,  in the last 168 hours CBC:  Recent Labs Lab 11/20/12 0442 11/21/12 0500 11/24/12 0523 11/25/12 0500 11/26/12 1215  WBC 13.5* 13.0* 12.9* 10.0 15.8*  NEUTROABS 9.8*  --   --   --  13.3*  HGB 9.2* 9.2* 9.3* 8.9* 10.0*  HCT 27.2* 27.7* 28.2* 27.3* 30.7*  MCV 85.5 86.8 86.8 87.5 87.7  PLT  339 332 404* 392 445*   Cardiac Enzymes: No results found for this basename: CKTOTAL, CKMB, CKMBINDEX, TROPONINI,  in the last 168 hours  BNP (last 3 results)  Recent Labs  11/17/12 0016  PROBNP 4406.0*   CBG:  Recent Labs Lab 11/24/12 2218 11/25/12 0749 11/25/12 1149 11/26/12 1128 11/26/12 1226  GLUCAP 172* 155* 203* 111* 166*    Radiological Exams on Admission: Dg Chest Port 1 View  11/26/2012   CLINICAL DATA:  Hypoglycemia, shortness of breath, weakness.  EXAM: PORTABLE CHEST - 1 VIEW  COMPARISON:  11/24/2012  FINDINGS: Patchy bilateral airspace opacities are again noted, minimally improved in the lung bases with increasing lung volumes. No effusions. Heart is normal size. No acute bony abnormality. Right PICC line is unchanged.  IMPRESSION: Patchy right upper lobe and bilateral lower lobe opacities. Slight improvement in aeration in the lung bases.   Electronically Signed   By: Rolm Baptise M.D.   On: 11/26/2012 12:21    Assessment/Plan Principal Problem:   Hypoglycemia Active Problems:   DM2 (diabetes mellitus, type 2)   PNA (pneumonia)   Hypothermia  1. Hypoglycemia 1. Will hold 70/30 insulin for now given hypoglycemia 2. Cont on SSI 3. Will consult diabetic coordinator 4. Admit to med/obs 2. DM 1. Per above, will decrease insulin  dose 3. Recent PNA 1. Elevated WBC with presenting hypothermia 2. Consider empiric coverage for HCAP 4. Hypothermia 1. Possibly secondary to hypoglycemia 2. Per above, empiric abx for HCAP 5. DVT prophylaxis 1. Heparin subQ  Code Status: Full (must indicate code status--if unknown or must be presumed, indicate so) Family Communication: Pt in room (indicate person spoken with, if applicable, with phone number if by telephone) Disposition Plan: Pending (indicate anticipated LOS)  Time spent: 4min  Nyela Cortinas, [facility] Pager [phone]  If 7PM-7AM, please contact night-coverage [email or web address] Password [email or web address] 11/26/2012, 3:05 PM

## 2012-11-26 NOTE — ED Notes (Signed)
Admitting MD at bedside.

## 2012-11-26 NOTE — ED Notes (Signed)
Notified RN of rectal temp 94.6

## 2012-11-26 NOTE — Progress Notes (Signed)
ANTIBIOTIC CONSULT NOTE - INITIAL  Pharmacy Consult for Vancomycin/Aztreonam Indication: pneumonia  Allergies  Allergen Reactions  . Codeine Anaphylaxis  . Penicillins Anaphylaxis    Patient Measurements:   Adjusted Body Weight:   Vital Signs: Temp: 97.8 F (36.6 C) (09/09 1535) Temp src: Rectal (09/09 1535) BP: 138/71 mmHg (09/09 1500) Pulse Rate: 90 (09/09 1500) Intake/Output from previous day:   Intake/Output from this shift:    Labs:  Recent Labs  11/24/12 0523 11/25/12 0500 11/26/12 1215  WBC 12.9* 10.0 15.8*  HGB 9.3* 8.9* 10.0*  PLT 404* 392 445*  CREATININE 1.54*  --  1.46*   The CrCl is unknown because both a height and weight (above a minimum accepted value) are required for this calculation.  Recent Labs  11/23/12 1630  [facility] 21.2*     Microbiology: Recent Results (from the past 720 hour(s))  ANAEROBIC CULTURE     Status: None   Collection Time    10/28/12  5:23 PM      Result Value Range Status   Specimen Description ABSCESS FOOT RIGHT   Final   Special Requests NONE POF VANCOMYCIN CIPRO   Final   Gram Stain     Final   Value: NO WBC SEEN     NO SQUAMOUS EPITHELIAL CELLS SEEN     NO ORGANISMS SEEN     Performed at [facility]   Culture     Final   Value: NO ANAEROBES ISOLATED     Performed at [facility]   Report Status 11/01/2012 FINAL   Final  CULTURE, ROUTINE-ABSCESS     Status: None   Collection Time    10/28/12  5:23 PM      Result Value Range Status   Specimen Description ABSCESS FOOT RIGHT   Final   Special Requests NONE POF VANCOMYCIN CIPRO   Final   Gram Stain     Final   Value: NO WBC SEEN     NO SQUAMOUS EPITHELIAL CELLS SEEN     NO ORGANISMS SEEN     Performed at [facility]   Culture     Final   Value: MODERATE STAPHYLOCOCCUS AUREUS     Note: RIFAMPIN AND GENTAMICIN SHOULD NOT BE USED AS SINGLE DRUGS FOR TREATMENT OF STAPH INFECTIONS.     Performed at [facility]   Report  Status 10/31/2012 FINAL   Final   Organism ID, Bacteria STAPHYLOCOCCUS AUREUS   Final  CULTURE, BLOOD (ROUTINE X 2)     Status: None   Collection Time    11/17/12 12:40 AM      Result Value Range Status   Specimen Description BLOOD LEFT ARM   Final   Special Requests BOTTLES DRAWN AEROBIC ONLY [facility]   Final   Culture  Setup Time     Final   Value: 11/17/2012 04:24     Performed at [facility]   Culture     Final   Value: NO GROWTH 5 DAYS     Performed at [facility]   Report Status 11/23/2012 FINAL   Final  CULTURE, BLOOD (ROUTINE X 2)     Status: None   Collection Time    11/17/12  2:58 AM      Result Value Range Status   Specimen Description BLOOD LEFT ARM   Final   Special Requests BOTTLES DRAWN AEROBIC ONLY [place]   Final   Culture  Setup Time  Final   Value: 11/17/2012 16:34     Performed at [facility]   Culture     Final   Value: NO GROWTH 5 DAYS     Performed at [facility]   Report Status 11/23/2012 FINAL   Final  MRSA PCR SCREENING     Status: None   Collection Time    11/17/12  5:00 AM      Result Value Range Status   MRSA by PCR NEGATIVE  NEGATIVE Final   Comment:            The GeneXpert MRSA Assay (FDA     approved for NASAL specimens     only), is one component of a     comprehensive MRSA colonization     surveillance program. It is not     intended to diagnose MRSA     infection nor to guide or     monitor treatment for     MRSA infections.    Medical History: Past Medical History  Diagnosis Date  . Diabetes mellitus   . Hypertension   . Seizures   . Stroke   . Peripheral vascular disease   . CKD (chronic kidney disease)     Stage II (per 06/2010 notes)  . Neuromuscular disorder   . Pneumonia   . Anemia     Medications: see med rec  Assessment: [name] discharged from hospital yesterday 9/8 on Cipro for PNA. Returns to ER with CBG 40 per family and 26 per EMS after being given AM insulin. Hypothermic in  ER. Start abx for HCAP. Complex PMH including CKD.  Labs: Scr 1.46. WBC 15.8  Goal of Therapy:  Vancomycin trough level 15-20 mcg/ml  Plan:  Aztreonam 1g IV q8hr dose ok. Vancomyin 1g in ER 1515 then 1g IV q24h based off levels from last hospitalization.  [name], PharmD, BCPS Clinical Staff Pharmacist Pager [phone]  [name] 11/26/2012,3:48 PM

## 2012-11-26 NOTE — ED Provider Notes (Signed)
CSN: [id number]     Arrival date & time 11/26/12  1108 History   First MD Initiated Contact with Patient 11/26/12 1112     Chief Complaint  Patient presents with  . Hypoglycemia   (Consider location/radiation/quality/duration/timing/severity/associated sxs/prior Treatment) HPI Comments: Patient is a 67 y/o male with a number of comorbidities including DM, discharged yesterday from hospital after 9 day hospital stay for sepsis/pneumonia, who presents from home with CC of hypoglycemia. Per family, patient woke this AM and was given 15 units of 70/30 Novolog per his usual regimen. He was then fed breakfast which he ate in its entirety. Patient settled in his bed to take a nap and shortly after began to complain of feeling "hot". Family went to check on patient and found him to be diaphoretic and drenched in sweat as well as disoriented. Family check blood sugar which was 48 at home and then called EMS. Patient was given two half sandwiches, a full sandwich, apple sauce, and three glasses of OJ prior to EMS arrival. CBG on arrival by EMS was 26. Patient was given Glucagon at this time as well as 261mL D10. EMS endorses patient being intermittently disoriented.   CBG in ED after intervention 111. Family endorses patient to be mentating at baseline at this time.  Patient is a 67 y.o. male presenting with hypoglycemia. The history is provided by the patient. No language interpreter was used.  Hypoglycemia Associated symptoms: sweats   Associated symptoms: no shortness of breath and no vomiting     Past Medical History  Diagnosis Date  . Diabetes mellitus   . Hypertension   . Seizures   . Stroke   . Peripheral vascular disease   . CKD (chronic kidney disease)     Stage II (per 06/2010 notes)  . Neuromuscular disorder   . Pneumonia   . Anemia    Past Surgical History  Procedure Laterality Date  . Colon surgery      partial colectomy  . Eye surgery      bilat cataract surgery  . Below knee  leg amputation      L  . Toe amputation      R two  . Amputation  10/10/2011    Procedure: AMPUTATION DIGIT;  Surgeon: Newt Minion, MD;  Location: [place];  Service: Orthopedics;  Laterality: Right;  Right Foot 4th Toe Amputation  . Orif toe fracture Right 10/09/2012    Procedure: OPEN REDUCTION INTERNAL FIXATION (ORIF) METATARSAL (TOE) FRACTURE;  Surgeon: Newt Minion, MD;  Location: [place];  Service: Orthopedics;  Laterality: Right;  Right Foot Base 1st Metatarsal and Medial Cuneoform Excision, Internal Fixation, Antibiotic Beads  . I&d extremity Right 10/28/2012    Procedure: IRRIGATION AND DEBRIDEMENT EXTREMITY;  Surgeon: Newt Minion, MD;  Location: [place];  Service: Orthopedics;  Laterality: Right;  Irrigation and Debridement Right Foot, Remove Deep Hardware, Place Antibiotic Beads  . Vascular surgery     History reviewed. No pertinent family history. History  Substance Use Topics  . Smoking status: Never Smoker   . Smokeless tobacco: Never Used  . Alcohol Use: 0.0 oz/week     Comment: seldom    Review of Systems  Constitutional: Positive for diaphoresis.  Eyes: Negative for visual disturbance.  Respiratory: Negative for shortness of breath.   Cardiovascular: Negative for chest pain.  Gastrointestinal: Negative for nausea, vomiting and abdominal pain.  Skin: Positive for wound (R foot; healing).  Neurological: Negative for syncope.  Psychiatric/Behavioral:       +  intermittent AMS  All other systems reviewed and are negative.    Allergies  Codeine and Penicillins  Home Medications   Current Outpatient Rx  Name  Route  Sig  Dispense  Refill  . amLODipine (NORVASC) 5 MG tablet   Oral   Take 1 tablet (5 mg total) by mouth daily.   30 tablet   0   . aspirin 325 MG tablet   Oral   Take 325 mg by mouth daily.         [name] atorvastatin (LIPITOR) 20 MG tablet   Oral   Take 20 mg by mouth daily.         . ciprofloxacin (CIPRO) 250 MG tablet   Oral   Take 250 mg by  mouth 2 (two) times daily.         . insulin aspart protamine- aspart (NOVOLOG MIX 70/30) (70-30) 100 UNIT/ML injection   Subcutaneous   Inject 6-15 Units into the skin See admin instructions. Takes 15 units in the morning, and 6 units in the evening         . insulin glargine (LANTUS) 100 UNIT/ML injection   Subcutaneous   Inject 24 Units into the skin at bedtime.         [name] lisinopril (PRINIVIL,ZESTRIL) 10 MG tablet   Oral   Take 1 tablet (10 mg total) by mouth daily.   30 tablet   0   . metoprolol (LOPRESSOR) 50 MG tablet   Oral   Take 25 mg by mouth 2 (two) times daily.         . phenytoin (DILANTIN) 200 MG ER capsule   Oral   Take 600 mg by mouth at bedtime.         . senna-docusate (SENOKOT-S) 8.6-50 MG per tablet   Oral   Take 1 tablet by mouth daily.         . silver sulfADIAZINE (SILVADENE) 1 % cream   Topical   Apply topically daily.   50 g   0   . sodium chloride 0.9 % injection   Intracatheter   10-40 mL by Intracatheter route as needed (flush).   10 mL   20   . tamsulosin (FLOMAX) 0.4 MG CAPS capsule   Oral   Take 1 capsule (0.4 mg total) by mouth daily after supper.   30 capsule   5   . timolol (BETIMOL) 0.5 % ophthalmic solution   Both Eyes   Place 1 drop into both eyes every morning.          . Travoprost, BAK Free, ([name]) 0.004 % SOLN ophthalmic solution   Both Eyes   Place 1 drop into both eyes at bedtime.         . polyethylene glycol (MIRALAX / GLYCOLAX) packet   Oral   Take 17 g by mouth daily as needed (constipation).   14 each   0   . traMADol (ULTRAM) 50 MG tablet   Oral   Take 1 tablet (50 mg total) by mouth every 6 (six) hours as needed for pain. Maximum dose= 8 tablets per day   60 tablet   0   . VANCOMYCIN HCL IV   Intravenous   Inject 1,000 mg into the vein every 12 (twelve) hours.          BP 138/71  Pulse 90  Temp(Src) 95.9 F (35.5 C) (Rectal)  Resp 21  SpO2 100%  Physical Exam  Nursing  note and  vitals reviewed. Constitutional: He is oriented to person, place, and time. He appears well-developed and well-nourished. No distress.  Patient appears sleepy on initial presentation, but nontoxic appearing. Patient answers questions appropriately and follow simple commands.  HENT:  Head: Normocephalic and atraumatic.  Eyes: Conjunctivae and EOM are normal. No scleral icterus.  Neck: Normal range of motion.  Cardiovascular: Normal rate, regular rhythm and normal heart sounds.   Pulmonary/Chest: Effort normal and breath sounds normal. No respiratory distress. He has no wheezes. He has no rales.  Abdominal: Soft. He exhibits distension. He exhibits no mass. There is no tenderness. There is no rebound and no guarding.  Musculoskeletal: Normal range of motion.  L prosthesis and R foot wound dressing without lower extremity swelling, erythema, or pitting edema.  Neurological: He is alert and oriented to person, place, and time.  Patient mentating at baseline, per family  Skin: Skin is dry. No rash noted. He is not diaphoretic. No pallor.  Skin cool to touch; patient hypothermic with rectal temperarture of 94.36F  Psychiatric: He has a normal mood and affect.    ED Course  Procedures (including critical care time) Labs Review Labs Reviewed  CBC WITH DIFFERENTIAL - Abnormal; Notable for the following:    WBC 15.8 (*)    RBC 3.50 (*)    Hemoglobin 10.0 (*)    HCT 30.7 (*)    RDW 16.2 (*)    Platelets 445 (*)    Neutrophils Relative % 84 (*)    Neutro Abs 13.3 (*)    Lymphocytes Relative 6 (*)    All other components within normal limits  COMPREHENSIVE METABOLIC PANEL - Abnormal; Notable for the following:    Glucose, Bld 190 (*)    Creatinine, Ser 1.46 (*)    Albumin 1.8 (*)    Total Bilirubin 0.2 (*)    GFR calc non Af Amer 49 (*)    GFR calc Af Amer 56 (*)    All other components within normal limits  GLUCOSE, CAPILLARY - Abnormal; Notable for the following:     Glucose-Capillary 111 (*)    All other components within normal limits  GLUCOSE, CAPILLARY - Abnormal; Notable for the following:    Glucose-Capillary 166 (*)    All other components within normal limits  CULTURE, BLOOD (ROUTINE X 2)  CULTURE, BLOOD (ROUTINE X 2)  URINE CULTURE  URINALYSIS, ROUTINE W REFLEX MICROSCOPIC   Imaging Review Dg Chest Port 1 View  11/26/2012   CLINICAL DATA:  Hypoglycemia, shortness of breath, weakness.  EXAM: PORTABLE CHEST - 1 VIEW  COMPARISON:  11/24/2012  FINDINGS: Patchy bilateral airspace opacities are again noted, minimally improved in the lung bases with increasing lung volumes. No effusions. Heart is normal size. No acute bony abnormality. Right PICC line is unchanged.  IMPRESSION: Patchy right upper lobe and bilateral lower lobe opacities. Slight improvement in aeration in the lung bases.   Electronically Signed   By: Rolm Baptise M.D.   On: 11/26/2012 12:21    Date: 11/26/2012  Rate: 78  Rhythm: normal sinus rhythm  QRS Axis: normal  Intervals: QT prolonged (QTc 529)  ST/T Wave abnormalities: normal  Conduction Disutrbances:none  Narrative Interpretation:   Old EKG Reviewed: QTc prolonged compared to prior from 11/17/12 I have personally reviewed and interpreted this EKG   MDM   1. Hypothermia, initial encounter   2. Hypoglycemia   3. Healthcare-associated pneumonia    Patient is a 67 y/o male with multiple comorbidities who presents  for hypoglycemia. CBG per EMS was 26 on arrival; CBG on presentation to ED 111. Glucose has been stable for duration of ED course. Patient also found to be hypothermic on arrival with rectal temperature of 94.74F. This has improved to 95.9 with warm blanket rewarming; bear hugger ordered to promote further warming. CXR shows persistent HCAP and patient with worsening leukocytosis to 15.8 since hospital discharge yesterday. IV Vancomycin and Primaxin ordered. Have consulted with Dr. Wyline Copas who will admit patient to  observation for hypothermia and HCAP. Temp admit orders placed.    Antonietta Breach, PA-C 11/26/12 1520

## 2012-11-26 NOTE — ED Notes (Signed)
ED PA at bedside

## 2012-11-26 NOTE — ED Notes (Signed)
Phlebotomy and Xray at bedside

## 2012-11-27 DIAGNOSIS — E162 Hypoglycemia, unspecified: Secondary | ICD-10-CM

## 2012-11-27 LAB — URINE CULTURE

## 2012-11-27 LAB — BASIC METABOLIC PANEL
BUN: 18 mg/dL (ref 6–23)
Chloride: 112 mEq/L (ref 96–112)
GFR calc Af Amer: 61 mL/min — ABNORMAL LOW (ref >90)
GFR calc non Af Amer: 53 mL/min — ABNORMAL LOW (ref >90)
Potassium: 4.2 mEq/L (ref 3.5–5.1)
Sodium: 139 mEq/L (ref 135–145)

## 2012-11-27 LAB — GLUCOSE, CAPILLARY
Glucose-Capillary: 121 mg/dL — ABNORMAL HIGH (ref 70–99)
Glucose-Capillary: 141 mg/dL — ABNORMAL HIGH (ref 70–99)
Glucose-Capillary: 159 mg/dL — ABNORMAL HIGH (ref 70–99)
Glucose-Capillary: 69 mg/dL — ABNORMAL LOW (ref 70–99)

## 2012-11-27 LAB — CBC
HCT: 26.7 % — ABNORMAL LOW (ref 39.0–52.0)
Hemoglobin: 8.7 g/dL — ABNORMAL LOW (ref 13.0–17.0)
MCHC: 32.6 g/dL (ref 30.0–36.0)
RBC: 3.05 MIL/uL — ABNORMAL LOW (ref 4.22–5.81)
WBC: 8.3 10*3/uL (ref 4.0–10.5)

## 2012-11-27 MED ORDER — SODIUM CHLORIDE 0.9 % IJ SOLN
10.0000 mL | INTRAMUSCULAR | Status: DC | PRN
  Administered 2012-11-29 – 2012-12-03 (×4): 10 mL
  Administered 2012-12-04: 20 mL
  Administered 2012-12-05: 10 mL
  Administered 2012-12-05: 20 mL
  Administered 2012-12-06: 40 mL
  Administered 2012-12-07 (×3): 10 mL

## 2012-11-27 NOTE — Progress Notes (Signed)
[facility] PROGRESS NOTE  [name]. [id number] DOB: [birth year] DOA: 11/26/2012 PCP: [name], MD  Assessment/Plan:  Healthcare Associated Pneumonia Recent hospitalization with imaging showing right lung opacities, discharged on antibiotic therapy with Vanc. Lab work in the ER yesterday showing white count of 15,800, repeat CXR showing persistent opacities. Patient also presenting with hypothermia. Started on emperic Vanc and Aztreonam. Follow up on culutres, supportive care.   Hypoglycemia He was discharged on his home regimen of insulin, having low blood sugars in the 20's per medical records.  Will continue accuchecks q ac q hs with sliding scale coverage. Blood sugars are improved.   Acute Renal Failure Kidney function continues to improve, with creatinine proving from 1.54 on 9/7 to 1.36 on this morning's blood work. I suspect secondary to pre-renal azotemia.   Hypothermia Could be related to profound hypoglycemia. Now resolved.   Diabetes Mellitus His insulin 70/30 held due to hypoglycemia. Will continue sliding scale coverage for now.   Code Status: Full Code Family Communication: I discussed plan with patient and with his son Alaa over telephone conversation; [phone] Disposition Plan: Continue emperic antibiotic therapy   Antibiotics:  Vancomycin (11/26/12)  Aztreonam (11/26/12)  HPI/Subjective: Who was recently discharged from the hospital for PNA. See discharge summary dated yesterday (11/25/12). Since discharge, the patient continued on his 70/30 and lantus insulins. He was noted to be profoundly hypoglycemic on the morning of admit with bs as low as the 20's. The patient received glucagon and orange juice with improvement of blood sugars. He was brought to the ED with improved BS. However, the patient was also found to be mildly hypothermic.  No overnight events, feeling better this am.    Objective: Filed Vitals:   11/27/12 0822  BP:  118/70  Pulse: 94  Temp: 98.4 F (36.9 C)  Resp: 22    Intake/Output Summary (Last 24 hours) at 11/27/12 1051 Last data filed at 11/27/12 1006  Gross per 24 hour  Intake   1045 ml  Output    875 ml  Net    170 ml   Filed Weights   11/26/12 1535 11/26/12 2038  Weight: 90 kg (198 lb 6.6 oz) 89.999 kg (198 lb 6.6 oz)    Exam:   General:  No acute distress, awake alert  Cardiovascular: regular rate and rhytm, normal s1-s2  Respiratory: Right sided crackles, normal inspiratory effort  Abdomen: Soft, nontender, nondistended  Musculoskeletal: preserved range of motion  Extremities: status post left AKA, right lower extremity 2 + edema  Data Reviewed: Basic Metabolic Panel:  Recent Labs Lab 11/21/12 0500 11/24/12 0523 11/26/12 1215 11/27/12 0500  NA 136 137 135 139  K 4.1 3.9 4.3 4.2  CL 107 109 105 112  CO2 19 19 20  18*  GLUCOSE 152* 125* 190* 133*  BUN 24* 25* 21 18  CREATININE 1.48* 1.54* 1.46* 1.36*  CALCIUM 8.4 8.4 8.4 7.8*   Liver Function Tests:  Recent Labs Lab 11/26/12 1215  AST 23  ALT 17  ALKPHOS 113  BILITOT 0.2*  PROT 6.5  ALBUMIN 1.8*   No results found for this basename: LIPASE, AMYLASE,  in the last 168 hours No results found for this basename: AMMONIA,  in the last 168 hours CBC:  Recent Labs Lab 11/21/12 0500 11/24/12 0523 11/25/12 0500 11/26/12 1215 11/27/12 0500  WBC 13.0* 12.9* 10.0 15.8* 8.3  NEUTROABS  --   --   --  13.3*  --   HGB 9.2* 9.3*  8.9* 10.0* 8.7*  HCT 27.7* 28.2* 27.3* 30.7* 26.7*  MCV 86.8 86.8 87.5 87.7 87.5  PLT 332 404* 392 445* 388   Cardiac Enzymes: No results found for this basename: CKTOTAL, CKMB, CKMBINDEX, TROPONINI,  in the last 168 hours BNP (last 3 results)  Recent Labs  11/17/12 0016  PROBNP 4406.0*   CBG:  Recent Labs Lab 11/26/12 1611 11/26/12 2037 11/26/12 2357 11/27/12 0408 11/27/12 0748  GLUCAP 188* 204* 191* 159* 121*    Recent Results (from the past 240 hour(s))   CULTURE, BLOOD (ROUTINE X 2)     Status: None   Collection Time    11/26/12 12:15 PM      Result Value Range Status   Specimen Description BLOOD RIGHT PICC LINE   Final   Special Requests BOTTLES DRAWN AEROBIC ONLY 10CC   Final   Culture  Setup Time     Final   Value: 11/26/2012 16:23     Performed at [facility]   Culture     Final   Value:        BLOOD CULTURE RECEIVED NO GROWTH TO DATE CULTURE WILL BE HELD FOR 5 DAYS BEFORE ISSUING A FINAL NEGATIVE REPORT     Performed at [facility]   Report Status PENDING   Incomplete  CULTURE, BLOOD (ROUTINE X 2)     Status: None   Collection Time    11/26/12 12:20 PM      Result Value Range Status   Specimen Description BLOOD LEFT HAND   Final   Special Requests BOTTLES DRAWN AEROBIC ONLY 5CC   Final   Culture  Setup Time     Final   Value: 11/26/2012 16:23     Performed at [facility]   Culture     Final   Value:        BLOOD CULTURE RECEIVED NO GROWTH TO DATE CULTURE WILL BE HELD FOR 5 DAYS BEFORE ISSUING A FINAL NEGATIVE REPORT     Performed at [facility]   Report Status PENDING   Incomplete     Studies: Dg Chest Port 1 View  11/26/2012   CLINICAL DATA:  Hypoglycemia, shortness of breath, weakness.  EXAM: PORTABLE CHEST - 1 VIEW  COMPARISON:  11/24/2012  FINDINGS: Patchy bilateral airspace opacities are again noted, minimally improved in the lung bases with increasing lung volumes. No effusions. Heart is normal size. No acute bony abnormality. Right PICC line is unchanged.  IMPRESSION: Patchy right upper lobe and bilateral lower lobe opacities. Slight improvement in aeration in the lung bases.   Electronically Signed   By: Rolm Baptise M.D.   On: 11/26/2012 12:21    Scheduled Meds: . amLODipine  5 mg Oral Daily  . aspirin  325 mg Oral Daily  . atorvastatin  20 mg Oral Daily  . aztreonam  2 g Intravenous Q8H  . heparin  5,000 Units Subcutaneous Q8H  . influenza vac split quadrivalent PF  0.5 mL  Intramuscular Tomorrow-1000  . insulin aspart  0-15 Units Subcutaneous TID WC  . insulin aspart  0-5 Units Subcutaneous QHS  . insulin glargine  24 Units Subcutaneous QHS  . latanoprost  1 drop Both Eyes QHS  . metoprolol  25 mg Oral BID  . phenytoin  300 mg Oral BID  . pneumococcal 23 valent vaccine  0.5 mL Intramuscular Tomorrow-1000  . senna-docusate  1 tablet Oral Daily  . silver sulfADIAZINE   Topical Daily  .  tamsulosin  0.4 mg Oral QPC supper  . timolol  1 drop Both Eyes Daily  . vancomycin  1,000 mg Intravenous Q24H   Continuous Infusions: . sodium chloride 1,000 mL (11/26/12 1513)  . sodium chloride      Principal Problem:   Hypoglycemia Active Problems:   DM2 (diabetes mellitus, type 2)   PNA (pneumonia)   Hypothermia    Time spent: [place], [place]  [facility] Pager [phone]. If 7PM-7AM, please contact night-coverage at [email or web address], password [facility] 11/27/2012, 10:51 AM  LOS: 1 day

## 2012-11-27 NOTE — Consult Note (Signed)
WOC consult Note Reason for Consult: pt with recent surgery 10/09/12 per Dr. [name], he has been followed by Dr. [name] during his most recent admission and had orders for silvadene to the left great toe ulcer along with dry dressings and to begin the use of Alpaca compression sock.   Wound type: surgical wound medial left foot and heel, also plantar surface at base of great toe.  Measurement: great toe: 0.5cm x 1.0cm, incision: 13 cm medial with one opening mid incision and one small opening at the distal incision.  Wound [id number] is mostly closed with sutures, the wound under the great toe is pink, moist Drainage (amount, consistency, odor) minimal noted from the toe wound, more serous noted from the medial wound mid-incision. Periwound:intact but periwound with extremely dry skin Dressing procedure/placement/frequency: silvadene to the great toe ulcer, cover the remainder of the incision with dry dressing.  Apply Alpaca sock, remove sock daily for skin inspection and wound care.  Spoke with Dr. [name] to determine if sutures can be removed, per family they have follow up appt. Scheduled tomorrow in Dr. Jess Barters office.   Dr. [name] will see patient and evaluate for suture removal.  Discussed POC with patient and bedside nurse.  Re consult if needed, will not follow at this time. Thanks  Shakedra Beam [place], [place] [phone])

## 2012-11-27 NOTE — Progress Notes (Signed)
Inpatient Diabetes Program Recommendations  AACE/ADA: New Consensus Statement on Inpatient Glycemic Control (2013)  Target Ranges:  Prepandial:   less than 140 mg/dL      Peak postprandial:   less than 180 mg/dL (1-2 hours)      Critically ill patients:  140 - 180 mg/dL   Referral received regarding management of hypoglycemia.   Given that this patient has some renal insufficiency, may want to consider using lantus and novolog rather than lantus and 70/30 at home. Pt may benefit from set dose of Lantus as is and "sliding scale" of sorts at home.  Either that or use a correction scale and meal coverage, but I do not think this patient would be amenable to carb counting to base his meal coverage dosage. If this is not typical for this patient to experience hypoglycemia, this reference may be associated with the hypothermia/infection that patient presented with at admission. Thank you, Rosita Kea, RN, CNS, Diabetes Coordinator [phone])

## 2012-11-28 LAB — GLUCOSE, CAPILLARY
Glucose-Capillary: 174 mg/dL — ABNORMAL HIGH (ref 70–99)
Glucose-Capillary: 130 mg/dL — ABNORMAL HIGH (ref 70–99)
Glucose-Capillary: 105 mg/dL — ABNORMAL HIGH (ref 70–99)

## 2012-11-28 MED ORDER — WHITE PETROLATUM GEL: Status: DC | PRN

## 2012-11-28 MED ORDER — INSULIN GLARGINE 100 UNIT/ML ~~LOC~~ SOLN
12.0000 [IU] | Freq: Every day | SUBCUTANEOUS | Status: DC
  Administered 2012-11-28: 12 [IU] via SUBCUTANEOUS
  Filled 2012-11-28 (×2): qty 0.12

## 2012-11-28 MED ORDER — DEXTROSE 50 % IV SOLN
INTRAVENOUS | Status: AC
  Administered 2012-11-28: 05:00:00 50 mL
  Filled 2012-11-28: qty 50

## 2012-11-28 MED ORDER — ALBUTEROL SULFATE (5 MG/ML) 0.5% IN NEBU
2.5000 mg | INHALATION_SOLUTION | Freq: Three times a day (TID) | RESPIRATORY_TRACT | Status: DC
  Administered 2012-11-28 – 2012-11-29 (×4): 2.5 mg via RESPIRATORY_TRACT
  Filled 2012-11-28 (×4): qty 0.5

## 2012-11-28 MED ORDER — IPRATROPIUM BROMIDE 0.02 % IN SOLN
0.5000 mg | Freq: Three times a day (TID) | RESPIRATORY_TRACT | Status: DC
  Administered 2012-11-28 – 2012-11-29 (×4): 0.5 mg via RESPIRATORY_TRACT
  Filled 2012-11-28 (×4): qty 2.5

## 2012-11-28 NOTE — Progress Notes (Signed)
Arrived to pts room to administer a breathing tx. Pt found sitting at side of bed SOB, he states that he cannot catch his breath and it feels like his side is catching. Pt on Ra, HR 103. Albuterol/Atrovent neb given at this time. Pt states that he feels that the neb tx is helping. RT will continue to monitor.   Lissa Merlin

## 2012-11-28 NOTE — Evaluation (Addendum)
Physical Therapy Evaluation Patient Details Name: [name]. MRN: [id number] DOB: 1946/03/01 Today's Date: 11/28/2012 Time:  - 1130-1149 19 min    PT Assessment / Plan / Recommendation History of Present Illness  67 y/o male who was recently discharged from the hospital for PNA. See discharge summary dated yesterday (11/25/12). Since discharge, the patient continued on his 70/30 and lantus insulins. He was noted to be profoundly hypoglycemic on the morning of admit with bs as low as the 20's. The patient received glucagon and orange juice with improvement of blood sugars. He was brought to the ED with improved BS. However, the patient was also found to be mildly hypothermic. The hospitalist service was consulted for admission.  Clinical Impression  Pt admitted with hypoglycemia.  Pt presents with generalized weakness and decreased mobility.  Pt would benefit from skilled acute PT services in order to improve functional mobility to return home with family assist.  Pt currently active in [place] as well.    PT Assessment  Patient needs continued PT services    Follow Up Recommendations  Home health PT;Supervision/Assistance - 24 hour (Pt. currently receiving HHPT 3x/week.)    Does the patient have the potential to tolerate intense rehabilitation      Barriers to Discharge        Equipment Recommendations  None recommended by PT    Recommendations for Other Services     Frequency Min 3X/week    Precautions / Restrictions Precautions Precautions: Fall Required Braces or Orthoses: Other Brace/Splint Other Brace/Splint: L LE Prosthetic Restrictions Weight Bearing Restrictions: Yes RLE Weight Bearing: Non weight bearing   Pertinent Vitals/Pain n/a      Mobility  Bed Mobility Bed Mobility: Rolling Left;Left Sidelying to Sit;Sitting - Scoot to Edge of Bed Rolling Left: With rail;3: Mod assist Left Sidelying to Sit: 1: +2 Total assist;HOB elevated Left Sidelying to Sit:  Patient Percentage: 70% Sitting - Scoot to Edge of Bed: 5: Supervision Details for Bed Mobility Assistance: Pt required increased time to perform bed mobilty secondary to anxiety and weakness. +2 assist to ensure safety and VCs for technique. Transfers Transfers: Sit to Stand;Stand to Sit;Stand Pivot Transfers Sit to Stand: 1: +2 Total assist;From bed;With upper extremity assist Sit to Stand: Patient Percentage: 60% Stand to Sit: 1: +2 Total assist;To chair/3-in-1;To elevated surface;With armrests Stand to Sit: Patient Percentage: 70% Stand Pivot Transfers: 1: +2 Total assist;With armrests Stand Pivot Transfers: Patient Percentage: 50% Details for Transfer Assistance: VC's for hand placement and to extend R LE during sit<>stand transfer. VCs for RW placement and to initiate hop during stand pivot transfer. +2 assist to ensure safety as pt extremely anxious and weak. Ambulation/Gait Ambulation/Gait Assistance: Not tested (comment)    Exercises     PT Diagnosis: Difficulty walking;Generalized weakness  PT Problem List: Decreased strength;Decreased activity tolerance;Decreased mobility;Decreased knowledge of use of DME;Decreased safety awareness PT Treatment Interventions: DME instruction;Gait training;Functional mobility training;Therapeutic activities;Therapeutic exercise;Patient/family education     PT Goals(Current goals can be found in the care plan section) Acute Rehab PT Goals Patient Stated Goal: to go home PT Goal Formulation: With patient Time For Goal Achievement: 12/12/12 Potential to Achieve Goals: Good  Visit Information  Assistance Needed: +2 History of Present Illness: 67 y/o male who was recently discharged from the hospital for PNA. See discharge summary dated yesterday (11/25/12). Since discharge, the patient continued on his 70/30 and lantus insulins. He was noted to be profoundly hypoglycemic on the morning of admit with bs  as low as the 20's. The patient received  glucagon and orange juice with improvement of blood sugars. He was brought to the ED with improved BS. However, the patient was also found to be mildly hypothermic. The hospitalist service was consulted for admission.       Prior [place] expects to be discharged to:: Private residence Living Arrangements: Spouse/significant other;Other relatives (Lives with wife and Film/video editor) Available Help at Discharge: Family;Available 24 hours/day Type of Home: House Home Access: Stairs to enter;Ramped entrance (Temporary ramp in place) Entrance Stairs-Number of Steps: 2 Entrance Stairs-Rails: None Home Layout: One level Home Equipment: Bedside commode;Shower seat;Walker - 2 wheels;Wheelchair - manual Prior Function Level of Independence: Needs assistance Gait / Transfers Assistance Needed: Uses WC for primary mobility and RW with prosthesis for transfers. ADL's / Homemaking Assistance Needed: Wife and Goddaughter assisted pt with prostetic. Comments: Pt's bedroom setup at home allows RW to be secured and pt uses UEs to pull up to stand. Pt uses head resting against wall to assist with standing balance. Pt hops on LLE with RW to transfer.  Communication Communication: No difficulties    Cognition  Cognition Arousal/Alertness: Awake/alert Behavior During Therapy: WFL for tasks assessed/performed;Anxious Overall Cognitive Status: Within Functional Limits for tasks assessed    Extremity/Trunk Assessment Upper Extremity Assessment RUE Deficits / Details: Functional weakness observed with use of RW, pt reports he is better at pulling than pushing. LUE Deficits / Details: Functional weakness observed with use of RW, pt reports he is better at pulling than pushing. 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 RLE Sensation: history of peripheral neuropathy   Balance Balance Balance  Assessed: Yes Dynamic Sitting Balance Dynamic Sitting - Balance Support: Feet unsupported;No upper extremity supported;During functional activity Dynamic Sitting - Level of Assistance: 5: Stand by assistance Dynamic Sitting Balance - Compensations: Pt able to maintin sitting balance while donning prosthesis.  End of Session PT - End of Session Equipment Utilized During Treatment: Gait belt;Other (comment) (L prosthesis) Activity Tolerance: Patient limited by fatigue Patient left: with family/visitor present;in chair;with call bell/phone within reach Nurse Communication: Mobility status  GP     Audie Clear 11/28/2012, 4:55 PM

## 2012-11-28 NOTE — Progress Notes (Signed)
[facility] PROGRESS NOTE  [name]. [id number] DOB: 1945/09/16 DOA: 11/26/2012 PCP: [name], MD  Assessment/Plan:  Healthcare Associated Pneumonia Patient's white count coming down yesterday thousand 300 from 15,800 on 11/26/2012. He complains of ongoing cough however has remained stable, afebrile. A repeat chest x-ray tomorrow morning, at which time will likely simplify his antibiotic regimen if he remains stable.  Blood cultures we will have remained negative.   Hypoglycemia Patient had been on insulin 7030 as well as Lantus at home. It's probable that taking these together led to his hypoglycemia. He was continued on just Lantus alone on this hospitalization, becoming hypoglycemic again this morning with blood sugars in the 40s. I further decreased his Lantus to 12 units subcutaneous each bedtime.  Acute Renal Failure Likely secondary to prerenal acidemia, lab work yesterday showed improvement. We'll repeat a BMP tomorrow morning.   Hypothermia Could be related to profound hypoglycemia. Now resolved.   Diabetes Mellitus As I stated above, do 2 hypoglycemia, Lantus is being decreased to 12 units subcutaneous each bedtime.  Code Status: Full Code Family Communication: I discussed plan with patient and with his son Jamaal over telephone conversation; [phone] Disposition Plan: Continue emperic antibiotic therapy   Antibiotics:  Vancomycin (11/26/12)  Aztreonam (11/26/12)  HPI/Subjective: Patient becoming hypoglycemic and this morning, having a blood sugars in the 40s. He was administered and after D50, with subsequent blood sugars improving. He complains of ongoing cough though has remained afebrile hemodynamically stable. Patient is tolerating by mouth intake..    Objective: Filed Vitals:   11/28/12 0755  BP: 129/63  Pulse: 87  Temp: 98.4 F (36.9 C)  Resp: 20    Intake/Output Summary (Last 24 hours) at 11/28/12 1317 Last data filed at  11/28/12 1041  Gross per 24 hour  Intake   2740 ml  Output   1790 ml  Net    950 ml   Filed Weights   11/26/12 1535 11/26/12 2038 11/27/12 2009  Weight: 90 kg (198 lb 6.6 oz) 89.999 kg (198 lb 6.6 oz) 89.999 kg (198 lb 6.6 oz)    Exam:   General:  No acute distress, awake alert  Cardiovascular: regular rate and rhytm, normal s1-s2  Respiratory: Right sided crackles, normal inspiratory effort  Abdomen: Soft, nontender, nondistended  Musculoskeletal: preserved range of motion  Extremities: status post left AKA, right lower extremity 2 + edema  Data Reviewed: Basic Metabolic Panel:  Recent Labs Lab 11/24/12 0523 11/26/12 1215 11/27/12 0500  NA 137 135 139  K 3.9 4.3 4.2  CL 109 105 112  CO2 19 20 18*  GLUCOSE 125* 190* 133*  BUN 25* 21 18  CREATININE 1.54* 1.46* 1.36*  CALCIUM 8.4 8.4 7.8*   Liver Function Tests:  Recent Labs Lab 11/26/12 1215  AST 23  ALT 17  ALKPHOS 113  BILITOT 0.2*  PROT 6.5  ALBUMIN 1.8*   No results found for this basename: LIPASE, AMYLASE,  in the last 168 hours No results found for this basename: AMMONIA,  in the last 168 hours CBC:  Recent Labs Lab 11/24/12 0523 11/25/12 0500 11/26/12 1215 11/27/12 0500  WBC 12.9* 10.0 15.8* 8.3  NEUTROABS  --   --  13.3*  --   HGB 9.3* 8.9* 10.0* 8.7*  HCT 28.2* 27.3* 30.7* 26.7*  MCV 86.8 87.5 87.7 87.5  PLT 404* 392 445* 388   Cardiac Enzymes: No results found for this basename: CKTOTAL, CKMB, CKMBINDEX, TROPONINI,  in the last 168  hours BNP (last 3 results)  Recent Labs  11/17/12 0016  PROBNP 4406.0*   CBG:  Recent Labs Lab 11/27/12 2347 11/28/12 0425 11/28/12 0449 11/28/12 0703 11/28/12 1205  GLUCAP 69* 41* 85 130* 105*    Recent Results (from the past 240 hour(s))  CULTURE, BLOOD (ROUTINE X 2)     Status: None   Collection Time    11/26/12 12:15 PM      Result Value Range Status   Specimen Description BLOOD RIGHT PICC LINE   Final   Special Requests BOTTLES  DRAWN AEROBIC ONLY 10CC   Final   Culture  Setup Time     Final   Value: 11/26/2012 16:23     Performed at [facility]   Culture     Final   Value:        BLOOD CULTURE RECEIVED NO GROWTH TO DATE CULTURE WILL BE HELD FOR 5 DAYS BEFORE ISSUING A FINAL NEGATIVE REPORT     Performed at [facility]   Report Status PENDING   Incomplete  CULTURE, BLOOD (ROUTINE X 2)     Status: None   Collection Time    11/26/12 12:20 PM      Result Value Range Status   Specimen Description BLOOD LEFT HAND   Final   Special Requests BOTTLES DRAWN AEROBIC ONLY 5CC   Final   Culture  Setup Time     Final   Value: 11/26/2012 16:23     Performed at [facility]   Culture     Final   Value:        BLOOD CULTURE RECEIVED NO GROWTH TO DATE CULTURE WILL BE HELD FOR 5 DAYS BEFORE ISSUING A FINAL NEGATIVE REPORT     Performed at [facility]   Report Status PENDING   Incomplete  URINE CULTURE     Status: None   Collection Time    11/26/12  3:19 PM      Result Value Range Status   Specimen Description URINE, RANDOM   Final   Special Requests NONE   Final   Culture  Setup Time     Final   Value: 11/26/2012 16:16     Performed at [place]     Final   Value: 3,000 COLONIES/ML     Performed at [facility]   Culture     Final   Value: INSIGNIFICANT GROWTH     Performed at [facility]   Report Status 11/27/2012 FINAL   Final     Studies: No results found.  Scheduled Meds: . amLODipine  5 mg Oral Daily  . aspirin  325 mg Oral Daily  . atorvastatin  20 mg Oral Daily  . aztreonam  2 g Intravenous Q8H  . heparin  5,000 Units Subcutaneous Q8H  . insulin aspart  0-15 Units Subcutaneous TID WC  . insulin aspart  0-5 Units Subcutaneous QHS  . insulin glargine  12 Units Subcutaneous QHS  . latanoprost  1 drop Both Eyes QHS  . metoprolol  25 mg Oral BID  . phenytoin  300 mg Oral BID  . senna-docusate  1 tablet Oral Daily  . silver  sulfADIAZINE   Topical Daily  . tamsulosin  0.4 mg Oral QPC supper  . timolol  1 drop Both Eyes Daily  . vancomycin  1,000 mg Intravenous Q24H   Continuous Infusions: . sodium chloride 1,000 mL (11/26/12 1513)  .  sodium chloride 1,000 mL (11/27/12 2146)    Principal Problem:   Hypoglycemia Active Problems:   DM2 (diabetes mellitus, type 2)   PNA (pneumonia)   Hypothermia    Time spent: [place], [place]  [facility] Pager [phone]. If 7PM-7AM, please contact night-coverage at [email or web address], password [facility] 11/28/2012, 1:17 PM  LOS: 2 days

## 2012-11-28 NOTE — Progress Notes (Signed)
RT note:  Pt requested a neb tx at this time due to SOB. I will assess pt to see if I can switch him to scheduled nebs. [name]

## 2012-11-28 NOTE — Evaluation (Signed)
I have reviewed this note and agree with all findings. Kati Letrice Pollok, PT, DPT Pager: [phone]   

## 2012-11-28 NOTE — Evaluation (Deleted)
Physical Therapy Evaluation Patient Details Name: [name]. MRN: [id number] DOB: November 05, 1945 Today's Date: 11/28/2012 Time:  -     PT Assessment / Plan / Recommendation History of Present Illness     Clinical Impression  Pt admitted with hypoglycemia, one day after d/c from the hospital for PNA. Pt currently with functional limitations due to the deficits listed below (see PT Problem List). Pt's Goddaughter stated pt is at home with wife and Goddaughter present 24 hours/day.  Pt primary utilizes W/C for mobility and RW to perform transfers.  Pt has generalized weakness in bil UEs/LEs.  Pt is currently receiving home health PT 3x/week to address decreased mobility and impaired strength.  Pt will benefit from skilled PT to increase their independence and safety with mobility to allow discharge.     PT Assessment       Follow Up Recommendations       Does the patient have the potential to tolerate intense rehabilitation      Barriers to Discharge        Equipment Recommendations       Recommendations for Other Services     Frequency      Precautions / Restrictions     Pertinent Vitals/Pain Pt did not report any pain during session.      Mobility       Exercises     PT Diagnosis:    PT Problem List:   PT Treatment Interventions:       PT Goals(Current goals can be found in the care plan section)    Visit Information          Prior Functioning       Cognition       Extremity/Trunk Assessment     Balance    End of Session    GP     Audie Clear 11/28/2012, 4:52 PM

## 2012-11-28 NOTE — Progress Notes (Signed)
hypoHypoglycemic Event  CBG:41  Treatment: D50 IV 25 mL  Symptoms: None  Follow-up CBG: Time:0430 CBG Result:87  Possible Reasons for Event: Unknown  Comments/MD notified:    Starla Link  Remember to initiate Hypoglycemia Order Set & complete

## 2012-11-28 NOTE — Progress Notes (Signed)
Patient ID: [name]., male   DOB: 1945-05-01, 67 y.o.   MRN: [id number] Right foot healing well, nursing to remove sutures today

## 2012-11-29 ENCOUNTER — Inpatient Hospital Stay (HOSPITAL_COMMUNITY): Payer: Medicare Other

## 2012-11-29 ENCOUNTER — Encounter (HOSPITAL_COMMUNITY): Payer: Self-pay | Admitting: Radiology

## 2012-11-29 DIAGNOSIS — J9600 Acute respiratory failure, unspecified whether with hypoxia or hypercapnia: Secondary | ICD-10-CM

## 2012-11-29 DIAGNOSIS — J189 Pneumonia, unspecified organism: Principal | ICD-10-CM

## 2012-11-29 DIAGNOSIS — G9340 Encephalopathy, unspecified: Secondary | ICD-10-CM

## 2012-11-29 DIAGNOSIS — J9601 Acute respiratory failure with hypoxia: Secondary | ICD-10-CM | POA: Diagnosis not present

## 2012-11-29 LAB — COMPREHENSIVE METABOLIC PANEL
ALT: 13 U/L (ref 0–53)
BUN: 19 mg/dL (ref 6–23)
Calcium: 8 mg/dL — ABNORMAL LOW (ref 8.4–10.5)
GFR calc Af Amer: 61 mL/min — ABNORMAL LOW (ref >90)
Glucose, Bld: 153 mg/dL — ABNORMAL HIGH (ref 70–99)
Sodium: 133 mEq/L — ABNORMAL LOW (ref 135–145)
Total Protein: 6.3 g/dL (ref 6.0–8.3)

## 2012-11-29 LAB — BLOOD GAS, ARTERIAL
Acid-base deficit: 7.2 mmol/L — ABNORMAL HIGH (ref 0.0–2.0)
Bicarbonate: 18.5 mEq/L — ABNORMAL LOW (ref 20.0–24.0)
Drawn by: 23588
Drawn by: 23588
FIO2: 1 %
FIO2: 0.4 %
MECHVT: 580 mL
MECHVT: 580 mL
O2 Saturation: 99.8 %
PEEP: 5 cmH2O
Patient temperature: 98.6
RATE: 16 resp/min
TCO2: 20.6 mmol/L (ref 0–100)
TCO2: 19.8 mmol/L (ref 0–100)
pCO2 arterial: 51.7 mmHg — ABNORMAL HIGH (ref 35.0–45.0)
pCO2 arterial: 52.6 mmHg — ABNORMAL HIGH (ref 35.0–45.0)
pCO2 arterial: 42 mmHg (ref 35.0–45.0)
pH, Arterial: 7.178 — CL (ref 7.350–7.450)
pH, Arterial: 7.183 — CL (ref 7.350–7.450)
pH, Arterial: 7.267 — ABNORMAL LOW (ref 7.350–7.450)
pO2, Arterial: 285 mmHg — ABNORMAL HIGH (ref 80.0–100.0)
pO2, Arterial: 103 mmHg — ABNORMAL HIGH (ref 80.0–100.0)

## 2012-11-29 LAB — GLUCOSE, CAPILLARY
Glucose-Capillary: 173 mg/dL — ABNORMAL HIGH (ref 70–99)
Glucose-Capillary: 196 mg/dL — ABNORMAL HIGH (ref 70–99)
Glucose-Capillary: 167 mg/dL — ABNORMAL HIGH (ref 70–99)
Glucose-Capillary: 139 mg/dL — ABNORMAL HIGH (ref 70–99)

## 2012-11-29 LAB — CBC
HCT: 27.9 % — ABNORMAL LOW (ref 39.0–52.0)
MCHC: 33 g/dL (ref 30.0–36.0)
MCV: 88.3 fL (ref 78.0–100.0)
Platelets: 439 10*3/uL — ABNORMAL HIGH (ref 150–400)
RDW: 16.4 % — ABNORMAL HIGH (ref 11.5–15.5)

## 2012-11-29 LAB — BASIC METABOLIC PANEL
BUN: 17 mg/dL (ref 6–23)
Creatinine, Ser: 1.36 mg/dL — ABNORMAL HIGH (ref 0.50–1.35)
GFR calc Af Amer: 61 mL/min — ABNORMAL LOW (ref >90)
GFR calc non Af Amer: 53 mL/min — ABNORMAL LOW (ref >90)
Potassium: 4.8 mEq/L (ref 3.5–5.1)

## 2012-11-29 LAB — CBC WITH DIFFERENTIAL/PLATELET
Basophils Absolute: 0.1 10*3/uL (ref 0.0–0.1)
Basophils Relative: 1 % (ref 0–1)
Eosinophils Absolute: 0.4 10*3/uL (ref 0.0–0.7)
Eosinophils Relative: 3 % (ref 0–5)
Hemoglobin: 9.2 g/dL — ABNORMAL LOW (ref 13.0–17.0)
Monocytes Absolute: 1 10*3/uL (ref 0.1–1.0)
Neutro Abs: 10.8 10*3/uL — ABNORMAL HIGH (ref 1.7–7.7)
Neutrophils Relative %: 82 % — ABNORMAL HIGH (ref 43–77)
RBC: 3.26 MIL/uL — ABNORMAL LOW (ref 4.22–5.81)

## 2012-11-29 LAB — PRO B NATRIURETIC PEPTIDE: Pro B Natriuretic peptide (BNP): 7199 pg/mL — ABNORMAL HIGH (ref 0–125)

## 2012-11-29 LAB — D-DIMER, QUANTITATIVE: D-Dimer, Quant: 1.95 ug/mL-FEU — ABNORMAL HIGH (ref 0.00–0.48)

## 2012-11-29 MED ORDER — METOPROLOL TARTRATE 12.5 MG HALF TABLET
12.5000 mg | ORAL_TABLET | Freq: Two times a day (BID) | ORAL | Status: DC
  Administered 2012-11-29 – 2012-11-30 (×2): 12.5 mg via ORAL
  Filled 2012-11-29 (×5): qty 1

## 2012-11-29 MED ORDER — SODIUM CHLORIDE 0.9 % IV SOLN
1.0000 g | Freq: Two times a day (BID) | INTRAVENOUS | Status: DC
  Filled 2012-11-29 (×2): qty 1

## 2012-11-29 MED ORDER — PROPOFOL 10 MG/ML IV EMUL
5.0000 ug/kg/min | INTRAVENOUS | Status: DC
  Filled 2012-11-29: qty 100

## 2012-11-29 MED ORDER — FUROSEMIDE 10 MG/ML IJ SOLN
INTRAMUSCULAR | Status: AC
  Filled 2012-11-29: qty 8

## 2012-11-29 MED ORDER — PANTOPRAZOLE SODIUM 40 MG IV SOLR
40.0000 mg | INTRAVENOUS | Status: DC
  Administered 2012-11-29 – 2012-11-30 (×2): 40 mg via INTRAVENOUS
  Filled 2012-11-29 (×3): qty 40

## 2012-11-29 MED ORDER — FENTANYL CITRATE 0.05 MG/ML IJ SOLN
INTRAMUSCULAR | Status: AC
  Administered 2012-11-29: 100 ug
  Filled 2012-11-29: qty 4

## 2012-11-29 MED ORDER — INSULIN ASPART 100 UNIT/ML ~~LOC~~ SOLN
0.0000 [IU] | SUBCUTANEOUS | Status: DC
  Administered 2012-11-29 – 2012-11-30 (×2): 2 [IU] via SUBCUTANEOUS
  Administered 2012-12-01: 5 [IU] via SUBCUTANEOUS
  Administered 2012-12-01: 2 [IU] via SUBCUTANEOUS
  Administered 2012-12-02: 5 [IU] via SUBCUTANEOUS
  Administered 2012-12-02: 3 [IU] via SUBCUTANEOUS
  Administered 2012-12-02: 5 [IU] via SUBCUTANEOUS

## 2012-11-29 MED ORDER — ALBUTEROL SULFATE (5 MG/ML) 0.5% IN NEBU: 2.5000 mg | INHALATION_SOLUTION | RESPIRATORY_TRACT | Status: DC | PRN

## 2012-11-29 MED ORDER — ETOMIDATE 2 MG/ML IV SOLN
40.0000 mg | Freq: Once | INTRAVENOUS | Status: AC
  Administered 2012-11-29: 17:00:00 40 mg via INTRAVENOUS
  Filled 2012-11-29: qty 20

## 2012-11-29 MED ORDER — MIDAZOLAM HCL 2 MG/2ML IJ SOLN
2.0000 mg | Freq: Once | INTRAMUSCULAR | Status: AC
  Administered 2012-11-29: 17:00:00 2 mg via INTRAVENOUS

## 2012-11-29 MED ORDER — MIDAZOLAM HCL 2 MG/2ML IJ SOLN
2.0000 mg | INTRAMUSCULAR | Status: DC | PRN
  Administered 2012-11-30: 2 mg via INTRAVENOUS
  Filled 2012-11-29: qty 2

## 2012-11-29 MED ORDER — INSULIN GLARGINE 100 UNIT/ML ~~LOC~~ SOLN
14.0000 [IU] | Freq: Every day | SUBCUTANEOUS | Status: DC
  Filled 2012-11-29: qty 0.14

## 2012-11-29 MED ORDER — FUROSEMIDE 10 MG/ML IJ SOLN
60.0000 mg | Freq: Once | INTRAMUSCULAR | Status: AC
  Administered 2012-11-29: 60 mg via INTRAVENOUS

## 2012-11-29 MED ORDER — MIDAZOLAM HCL 2 MG/2ML IJ SOLN
INTRAMUSCULAR | Status: AC
  Administered 2012-11-29: 2 mg
  Filled 2012-11-29: qty 4

## 2012-11-29 MED ORDER — FENTANYL CITRATE 0.05 MG/ML IJ SOLN
100.0000 ug | Freq: Once | INTRAMUSCULAR | Status: AC
  Administered 2012-11-29: 100 ug via INTRAVENOUS

## 2012-11-29 MED ORDER — SODIUM CHLORIDE 0.9 % IV SOLN
1.0000 g | Freq: Three times a day (TID) | INTRAVENOUS | Status: DC
  Administered 2012-11-29 – 2012-11-30 (×3): 1 g via INTRAVENOUS
  Filled 2012-11-29 (×7): qty 1

## 2012-11-29 MED ORDER — SODIUM CHLORIDE 0.9 % IV SOLN
25.0000 ug/h | INTRAVENOUS | Status: DC
  Administered 2012-11-29: 50 ug/h via INTRAVENOUS
  Administered 2012-11-30: 150 ug/h via INTRAVENOUS
  Filled 2012-11-29 (×2): qty 50

## 2012-11-29 MED ORDER — FENTANYL BOLUS VIA INFUSION
25.0000 ug | Freq: Four times a day (QID) | INTRAVENOUS | Status: DC | PRN
  Filled 2012-11-29: qty 100

## 2012-11-29 MED ORDER — IPRATROPIUM BROMIDE 0.02 % IN SOLN: 0.5000 mg | RESPIRATORY_TRACT | Status: DC | PRN

## 2012-11-29 MED ORDER — POLYETHYLENE GLYCOL 3350 17 G PO PACK
17.0000 g | PACK | Freq: Every day | ORAL | Status: DC
  Filled 2012-11-29: qty 1

## 2012-11-29 MED ORDER — SODIUM CHLORIDE 0.9 % IV SOLN
1000.0000 mL | INTRAVENOUS | Status: DC
  Administered 2012-11-29 – 2012-11-30 (×2): 1000 mL via INTRAVENOUS

## 2012-11-29 NOTE — Progress Notes (Signed)
I reassessed patient, he appeared to be more tachypneic this afternoon compared with this morning's exam. On lung exam he has bilateral expiratory wheezing as well as bibasilar crackles, becoming more prominent. I am concerned about the possibility fluid over load as he has been on NS at 168ml/hour. Will discontinue fluids and administer lasix 60mg  IV x 1. Will follow.

## 2012-11-29 NOTE — Progress Notes (Signed)
[facility] PROGRESS NOTE  [name]. [id number] DOB: 1945-04-15 DOA: 11/26/2012 PCP: [name], MD   In brief, patient is a 67 year old male with a past medical history of type 2 diabetes mellitus, hypertension, CVA, PAD, status post left BKA who was recently discharged from our service on 11/25/2012, admitted on 11/16/2012 at the time treated for healthcare associated pneumonia. Imaging studies showing patchy multifocal airspace disease. He was discharged on ciprofloxacin and vancomycin IV. He was brought into the emergent apartment on the following day with a profound hypoglycemia. Per medical records, patient's blood sugars were as low as in the 20s. It appears that he was taking both a Lantus and insulin 7030. Patient also complained of ongoing cough and shortness of breath, found to be mildly hypothermic. Initial chest x-ray showing patchy right upper lobe and bilateral lower lobe opacities, slight improvement in aeration in the lung bases. Patient was started on broad-spectrum empiric antibiotic therapy with IV aztreonam and IV vancomycin. I repeat a chest x-ray on 11/29/2012 which showed slight increase in degree of by lateral infiltrates when compared to prior examination. His white count had increased from 8300-13,600. Patient complaining of ongoing chills and subjective fevers. Given lack of improvement, I spoke with Dr. Johnnye Sima of infectious disease who recommended checking a CT scan of lungs are, discontinuing the aztreonam and starting meropenem.. With regard to hypoglycemia, his insulin 7030 was discontinued, with a continuation of Lantus. I discussed this with his primary care provider Dr. Wilson Singer over telephone conversation. He agreed with continuing Lantus only and starting NovoLog coverage with meals.  Assessment/Plan:  Healthcare Associated Pneumonia Patient showing an upward trend in his white count, with chest x-ray showing slight increase in degree of  bilateral infiltrates. Patient's blood cultures remaining negative. I discussed case with Dr. Johnnye Sima of infectious disease recommended checking a CT scan of lungs as well as changing him over to meropenem from aztreonam. Will repeat blood cultures.  Hypoglycemia Patient presently on 12 units of Lantus each bedtime. This was decreased from 24 units subcutaneous each bedtime. I decreased his dose to the development of hypoglycemia during this hospitalization.  Acute on Chronic Renal Failure Patient with history of chronic kidney disease, with his kidney function improving after receiving some IV fluids.  Hypothermia Could be related to profound hypoglycemia. Now resolved.   Diabetes Mellitus Will increase his Lantus dose to 14 units subcutaneous each bedtime as his blood sugars have increased since yesterday.  Code Status: Full Code Family Communication: I discussed plan with patient and with his son [name] over telephone conversation; [phone] Disposition Plan: Continue emperic antibiotic therapy   Antibiotics:  Vancomycin (11/26/12)  Aztreonam (11/26/12) discontinued on 11/29/12  Meropenem (11/29/12)  HPI/Subjective:   Objective: Filed Vitals:   11/29/12 0928  BP: 141/66  Pulse: 87  Temp: 98.7 F (37.1 C)  Resp:     Intake/Output Summary (Last 24 hours) at 11/29/12 1517 Last data filed at 11/29/12 0830  Gross per 24 hour  Intake   1480 ml  Output    300 ml  Net   1180 ml   Filed Weights   11/26/12 2038 11/27/12 2009 11/28/12 2314  Weight: 89.999 kg (198 lb 6.6 oz) 89.999 kg (198 lb 6.6 oz) 91.67 kg (202 lb 1.5 oz)    Exam:   General: ill appearing   Cardiovascular: regular rate and rhytm, normal s1-s2  Respiratory: Right sided crackles, normal inspiratory effort  Abdomen: Soft, nontender, nondistended  Musculoskeletal: preserved range of motion  Extremities: status post left AKA, right lower extremity 2 + edema  Data Reviewed: Basic Metabolic  Panel:  Recent Labs Lab 11/24/12 0523 11/26/12 1215 11/27/12 0500 11/29/12 0500  NA 137 135 139 135  K 3.9 4.3 4.2 4.8  CL 109 105 112 107  CO2 19 20 18* 19  GLUCOSE 125* 190* 133* 193*  BUN 25* 21 18 17   CREATININE 1.54* 1.46* 1.36* 1.36*  CALCIUM 8.4 8.4 7.8* 8.2*   Liver Function Tests:  Recent Labs Lab 11/26/12 1215 11/29/12 0500  AST 23  --   ALT 17  --   ALKPHOS 113  --   BILITOT 0.2*  --   PROT 6.5  --   ALBUMIN 1.8* 1.7*   No results found for this basename: LIPASE, AMYLASE,  in the last 168 hours No results found for this basename: AMMONIA,  in the last 168 hours CBC:  Recent Labs Lab 11/24/12 0523 11/25/12 0500 11/26/12 1215 11/27/12 0500 11/29/12 0500  WBC 12.9* 10.0 15.8* 8.3 13.6*  NEUTROABS  --   --  13.3*  --   --   HGB 9.3* 8.9* 10.0* 8.7* 9.2*  HCT 28.2* 27.3* 30.7* 26.7* 27.9*  MCV 86.8 87.5 87.7 87.5 88.3  PLT 404* 392 445* 388 439*   Cardiac Enzymes: No results found for this basename: CKTOTAL, CKMB, CKMBINDEX, TROPONINI,  in the last 168 hours BNP (last 3 results)  Recent Labs  11/17/12 0016  PROBNP 4406.0*   CBG:  Recent Labs Lab 11/29/12 0458 11/29/12 0811 11/29/12 0934 11/29/12 1208 11/29/12 1424  GLUCAP 196* 165* 190* 175* 167*    Recent Results (from the past 240 hour(s))  CULTURE, BLOOD (ROUTINE X 2)     Status: None   Collection Time    11/26/12 12:15 PM      Result Value Range Status   Specimen Description BLOOD RIGHT PICC LINE   Final   Special Requests BOTTLES DRAWN AEROBIC ONLY 10CC   Final   Culture  Setup Time     Final   Value: 11/26/2012 16:23     Performed at [facility]   Culture     Final   Value:        BLOOD CULTURE RECEIVED NO GROWTH TO DATE CULTURE WILL BE HELD FOR 5 DAYS BEFORE ISSUING A FINAL NEGATIVE REPORT     Performed at [facility]   Report Status PENDING   Incomplete  CULTURE, BLOOD (ROUTINE X 2)     Status: None   Collection Time    11/26/12 12:20 PM      Result  Value Range Status   Specimen Description BLOOD LEFT HAND   Final   Special Requests BOTTLES DRAWN AEROBIC ONLY 5CC   Final   Culture  Setup Time     Final   Value: 11/26/2012 16:23     Performed at [facility]   Culture     Final   Value:        BLOOD CULTURE RECEIVED NO GROWTH TO DATE CULTURE WILL BE HELD FOR 5 DAYS BEFORE ISSUING A FINAL NEGATIVE REPORT     Performed at [facility]   Report Status PENDING   Incomplete  URINE CULTURE     Status: None   Collection Time    11/26/12  3:19 PM      Result Value Range Status   Specimen Description URINE, RANDOM   Final   Special Requests NONE   Final  Culture  Setup Time     Final   Value: 11/26/2012 16:16     Performed at [place]     Final   Value: 3,000 COLONIES/ML     Performed at [facility]   Culture     Final   Value: INSIGNIFICANT GROWTH     Performed at [facility]   Report Status 11/27/2012 FINAL   Final     Studies: Dg Chest 2 View  11/29/2012   *RADIOLOGY REPORT*  Clinical Data: Shortness of breath  CHEST - 2 VIEW  Comparison: 11/26/2012  Findings: Cardiac shadow is stable.  A right-sided central venous line is again identified and stable.  The lungs again demonstrate patchy infiltrates bilaterally.  These are slightly more prominent than that seen on the prior exam.  These are most notable in the apices.  IMPRESSION: Slight increase in degree of bilateral infiltrates when compared with the prior exam.   Original Report Authenticated By: Inez Catalina, M.D.    Scheduled Meds: . albuterol  2.5 mg Nebulization TID  . amLODipine  5 mg Oral Daily  . aspirin  325 mg Oral Daily  . atorvastatin  20 mg Oral Daily  . aztreonam  2 g Intravenous Q8H  . heparin  5,000 Units Subcutaneous Q8H  . insulin aspart  0-15 Units Subcutaneous TID WC  . insulin aspart  0-5 Units Subcutaneous QHS  . insulin glargine  14 Units Subcutaneous QHS  . ipratropium  0.5 mg Nebulization  TID  . latanoprost  1 drop Both Eyes QHS  . metoprolol  25 mg Oral BID  . senna-docusate  1 tablet Oral Daily  . silver sulfADIAZINE   Topical Daily  . tamsulosin  0.4 mg Oral QPC supper  . timolol  1 drop Both Eyes Daily  . vancomycin  1,000 mg Intravenous Q24H   Continuous Infusions: . sodium chloride 1,000 mL (11/29/12 [id number])    Principal Problem:   Hypoglycemia Active Problems:   DM2 (diabetes mellitus, type 2)   PNA (pneumonia)   Hypothermia    Time spent: [place], [place]  [facility] Pager [phone]. If 7PM-7AM, please contact night-coverage at [email or web address], password [facility] 11/29/2012, 3:17 PM  LOS: 3 days

## 2012-11-29 NOTE — Progress Notes (Signed)
ANTIBIOTIC CONSULT NOTE - FOLLOW UP  Pharmacy Consult for Meropenem Indication: HCAP  Allergies  Allergen Reactions  . Codeine Anaphylaxis  . Penicillins Anaphylaxis    Patient Measurements: Height: 5' 10.08" (178 cm) Weight: 202 lb 1.5 oz (91.67 kg) IBW/kg (Calculated) : 73.18  Vital Signs: Temp: 98.7 F (37.1 C) (09/12 0928) Temp src: Oral (09/12 0928) BP: 141/66 mmHg (09/12 0928) Pulse Rate: 87 (09/12 0928) Intake/Output from previous day: 09/11 0701 - 09/12 0700 In: 1610 [P.O.:360; I.V.:1000; IV Piggyback:250] Out: 675 [Urine:675] Intake/Output from this shift: Total I/O In: 480 [P.O.:480] Out: 300 [Urine:300]  Labs:  Recent Labs  11/27/12 0500 11/29/12 0500  WBC 8.3 13.6*  HGB 8.7* 9.2*  PLT 388 439*  CREATININE 1.36* 1.36*   Estimated Creatinine Clearance: 61.7 ml/min (by C-G formula based on Cr of 1.36).  Assessment: 67yom on day #4 vancomycin and aztreonam for HCAP. CXR shows increase in bilateral infiltrates. His WBC is also rising. ID recommending CT scan of the lungs and to switch from aztreonam to meropenem. CKD is stable.  Goal of Therapy:  Appropriate meropenem dosing  Plan:  1) Meropenem 1g IV q8 2) Follow renal function, CT scan, LOT  Deboraha Sprang 11/29/2012,3:57 PM

## 2012-11-29 NOTE — Procedures (Addendum)
Arterial Catheter Insertion Procedure Note [name] [id number] 05-Feb-1946  Procedure: Insertion of Arterial Catheter  Indications: Blood pressure monitoring  Procedure Details Consent: Unable to obtain consent because of altered level of consciousness. Time Out: Verified patient identification, verified procedure, site/side was marked, verified correct patient position, special equipment/implants available, medications/allergies/relevent history reviewed, required imaging and test results available.  Performed  Maximum sterile technique was used including antiseptics, cap, gloves, gown, hand hygiene, mask and sheet. Skin prep: Chlorhexidine; local anesthetic administered 20 gauge catheter was inserted into left radial artery using the Seldinger technique.  Evaluation Blood flow good; BP tracing good. Complications: No apparent complications.    RT placed arterial catheter into pt. Left radial artery as per order and per protocol. Sterile procedures were met, no apparent complications.    [name] 11/29/2012

## 2012-11-29 NOTE — H&P (Signed)
PULMONARY  / CRITICAL CARE MEDICINE  Name: [name]. MRN: [id number] DOB: 03/06/1946    ADMISSION DATE:  11/26/2012 CONSULTATION DATE:  11/29/12  REFERRING MD :  Dr. Coralyn Pear PRIMARY SERVICE: [facility]-->PCCM  CHIEF COMPLAINT:  Acute Respiratory Distress  BRIEF PATIENT DESCRIPTION: [name] with recent d/c on 9/8 for PNA.  Readmitted 9/9 with hypoglycemia. 9/12 afternoon acutely decompensated with n/v & respiratory distress.  PCCM consulted for assistance   SIGNIFICANT EVENTS / STUDIES:  8/15 - discharge after I&D per Dr. [name] for R lateral foot ulcer 8/30 - 9/8 Admit with HCAP, R toe wound ......................................................................................................... 9/09 - Admit with hypoglycemia.  9/12 - Acute decompensation with n/v and subsequent change in mental status.  Pt lethargic, denied pain.   LINES / TUBES: OETT 9/12>>> PICC 8/12 >>>  CULTURES: UC 9/9>>> BCx2 9/9>>>  ANTIBIOTICS: Meropenem 9/9>>> Vanco 9/9>>>  HISTORY OF PRESENT ILLNESS:  [name] with PMH of HTN, Seizures, CVA, PVD s/p AKA, Neuromuscular disorder (not defined), CKD and DM with recent I&D of R foot ulceration per Dr. [name] (d/c'd on 8/15).  Readmitted 8/30-9/8 per [facility] for HCAP and continued wound care for R foot.  MRI 9/1 demonstrated diffuse soft tissue infection & osteo.  Discharged home on oral cipro & IV vanco.  Returned to [facility] ER on 9/9 with profound hypoglycemia - pt was on both lantus and 70/30 at time of discharge.      Per staff, he was of normal mental status on 9/11.  Afternoon of 9/12, patient had abrupt onset of nausea / vomiting with acute mental status changes.  Concern for aspiration with vomiting.   Pt noted to have upper airway predominant wheeze and did not tolerate bipap.  ABG 7.17 / 51 / 285 / 18.  Prior to intubation, patient denied pain.  Lethargic but complained of dyspnea.  PCCM consulted for acute respiratory failure.    PAST MEDICAL HISTORY :   Past Medical History  Diagnosis Date  . Diabetes mellitus   . Hypertension   . Seizures   . Stroke   . Peripheral vascular disease   . CKD (chronic kidney disease)     Stage II (per 06/2010 notes)  . Neuromuscular disorder   . Pneumonia   . Anemia   . Hypoglycemia 11/25/2012   Past Surgical History  Procedure Laterality Date  . Colon surgery      partial colectomy  . Eye surgery      bilat cataract surgery  . Below knee leg amputation      L  . Toe amputation      R two  . Amputation  10/10/2011    Procedure: AMPUTATION DIGIT;  Surgeon: Newt Minion, MD;  Location: [place];  Service: Orthopedics;  Laterality: Right;  Right Foot 4th Toe Amputation  . Orif toe fracture Right 10/09/2012    Procedure: OPEN REDUCTION INTERNAL FIXATION (ORIF) METATARSAL (TOE) FRACTURE;  Surgeon: Newt Minion, MD;  Location: [place];  Service: Orthopedics;  Laterality: Right;  Right Foot Base 1st Metatarsal and Medial Cuneoform Excision, Internal Fixation, Antibiotic Beads  . I&d extremity Right 10/28/2012    Procedure: IRRIGATION AND DEBRIDEMENT EXTREMITY;  Surgeon: Newt Minion, MD;  Location: [place];  Service: Orthopedics;  Laterality: Right;  Irrigation and Debridement Right Foot, Remove Deep Hardware, Place Antibiotic Beads  . Vascular surgery     Prior to Admission medications   Medication Sig Start Date End Date Taking? Authorizing Provider  amLODipine (NORVASC) 5 MG tablet Take 1 tablet (5 mg total) by mouth daily. 11/25/12  Yes Cherene Altes, MD  aspirin 325 MG tablet Take 325 mg by mouth daily.   Yes Historical Provider, MD  atorvastatin (LIPITOR) 20 MG tablet Take 20 mg by mouth daily.   Yes Historical Provider, MD  ciprofloxacin (CIPRO) 250 MG tablet Take 250 mg by mouth 2 (two) times daily. 11/11/12 12/09/12 Yes Historical Provider, MD  insulin aspart protamine- aspart (NOVOLOG MIX 70/30) (70-30) 100 UNIT/ML injection Inject 6-15 Units into the skin See admin instructions. Takes 15 units in  the morning, and 6 units in the evening 11/01/12  Yes Ripudeep K Rai, MD  insulin glargine (LANTUS) 100 UNIT/ML injection Inject 24 Units into the skin at bedtime.   Yes Historical Provider, MD  lisinopril (PRINIVIL,ZESTRIL) 10 MG tablet Take 1 tablet (10 mg total) by mouth daily. 11/25/12  Yes Cherene Altes, MD  metoprolol (LOPRESSOR) 50 MG tablet Take 25 mg by mouth 2 (two) times daily.   Yes Historical Provider, MD  phenytoin (DILANTIN) 100 MG ER capsule Take 300 mg by mouth at bedtime.   Yes Historical Provider, MD  senna-docusate (SENOKOT-S) 8.6-50 MG per tablet Take 1 tablet by mouth daily. 11/01/12  Yes Ripudeep Krystal Eaton, MD  silver sulfADIAZINE (SILVADENE) 1 % cream Apply topically daily. 11/25/12  Yes Cherene Altes, MD  sodium chloride 0.9 % injection 10-40 mL by Intracatheter route as needed (flush). 11/01/12  Yes Ripudeep Krystal Eaton, MD  tamsulosin (FLOMAX) 0.4 MG CAPS capsule Take 1 capsule (0.4 mg total) by mouth daily after supper. 11/01/12  Yes Ripudeep Krystal Eaton, MD  timolol (BETIMOL) 0.5 % ophthalmic solution Place 1 drop into both eyes every morning.    Yes Historical Provider, MD  Travoprost, BAK Free, ([name]) 0.004 % SOLN ophthalmic solution Place 1 drop into both eyes at bedtime.   Yes Historical Provider, MD  polyethylene glycol (MIRALAX / GLYCOLAX) packet Take 17 g by mouth daily as needed (constipation). 11/01/12   Ripudeep Krystal Eaton, MD  traMADol (ULTRAM) 50 MG tablet Take 1 tablet (50 mg total) by mouth every 6 (six) hours as needed for pain. Maximum dose= 8 tablets per day 10/10/12   Newt Minion, MD  VANCOMYCIN HCL IV Inject 1,000 mg into the vein every 12 (twelve) hours. 11/11/12 12/09/12  Historical Provider, MD   Allergies  Allergen Reactions  . Codeine Anaphylaxis  . Penicillins Anaphylaxis    FAMILY HISTORY:  History reviewed. No pertinent family history. SOCIAL HISTORY:  reports that he has never smoked. He has never used smokeless tobacco. He reports that  drinks alcohol.  He reports that he does not use illicit drugs.  REVIEW OF SYSTEMS:  See HPI.  Unable to complete as pt altered.   SUBJECTIVE:   VITAL SIGNS: Temp:  [98.5 F (36.9 C)-99.1 F (37.3 C)] 98.7 F (37.1 C) (09/12 0928) Pulse Rate:  [76-107] 76 (09/12 1724) Resp:  [18] 18 (09/12 1724) BP: (108-141)/(60-74) 108/60 mmHg (09/12 1724) SpO2:  [93 %-100 %] 100 % (09/12 1724) FiO2 (%):  [40 %] 40 % (09/12 1724) Weight:  [202 lb 1.5 oz (91.67 kg)] 202 lb 1.5 oz (91.67 kg) (09/11 2314)  HEMODYNAMICS:    VENTILATOR SETTINGS: Vent Mode:  [-] PRVC FiO2 (%):  [40 %] 40 % Set Rate:  [16 bmp] 16 bmp Vt Set:  [580 mL] 580 mL PEEP:  [5 cmH20] 5 cmH20 Plateau Pressure:  [22 cmH20] 22  cmH20  INTAKE / OUTPUT: Intake/Output     09/11 0701 - 09/12 0700 09/12 0701 - 09/13 0700   P.O. 360 480   I.V. (mL/kg) 1000 (10.9)    IV Piggyback 250    Total Intake(mL/kg) 1610 (17.6) 480 (5.2)   Urine (mL/kg/hr) 675 (0.3) 300 (0.3)   Total Output 675 300   Net +935 +180          PHYSICAL EXAMINATION: General:  Chronically ill adult male in acute distress prior to intubation  Neuro:  Lethargic, aroused, minimal interaction  HEENT:  Mm pink/moist, no jvd Cardiovascular:  s1s2 rrr, distant tones Lungs:  resp's even/non-labored, lungs bilaterally clear post intubation Abdomen:  Soft, distended, decreased bs  Musculoskeletal:  No acute deformities, R BKA Skin:  Right medial foot 0.5 cm ulcer  LABS:  CBC Recent Labs     11/27/12  0500  11/29/12  0500  11/29/12  1740  WBC  8.3  13.6*  13.2*  HGB  8.7*  9.2*  9.2*  HCT  26.7*  27.9*  29.3*  PLT  388  439*  431*   Coag's No results found for this basename: APTT, INR,  in the last 72 hours BMET Recent Labs     11/27/12  0500  11/29/12  0500  NA  139  135  K  4.2  4.8  CL  112  107  CO2  18*  19  BUN  18  17  CREATININE  1.36*  1.36*  GLUCOSE  133*  193*   Electrolytes Recent Labs     11/27/12  0500  11/29/12  0500  CALCIUM  7.8*   8.2*   Sepsis Markers No results found for this basename: LACTICACIDVEN, PROCALCITON, O2SATVEN,  in the last 72 hours ABG Recent Labs     11/29/12  1630  PHART  7.178*  PCO2ART  51.7*  PO2ART  285.0*   Liver Enzymes Recent Labs     11/29/12  0500  ALBUMIN  1.7*   Cardiac Enzymes No results found for this basename: TROPONINI, PROBNP,  in the last 72 hours Glucose Recent Labs     11/29/12  0458  11/29/12  0811  11/29/12  0934  11/29/12  1208  11/29/12  1424  11/29/12  1638  GLUCAP  196*  165*  190*  175*  167*  162*    Imaging Dg Chest 2 View  11/29/2012   *RADIOLOGY REPORT*  Clinical Data: Shortness of breath  CHEST - 2 VIEW  Comparison: 11/26/2012  Findings: Cardiac shadow is stable.  A right-sided central venous line is again identified and stable.  The lungs again demonstrate patchy infiltrates bilaterally.  These are slightly more prominent than that seen on the prior exam.  These are most notable in the apices.  IMPRESSION: Slight increase in degree of bilateral infiltrates when compared with the prior exam.   Original Report Authenticated By: Inez Catalina, M.D.    ASSESSMENT / PLAN:  PULMONARY A: Acute Respiratory Failure - unclear etiology, wheezing with upper airway predominance prior to intubation concerning for neuro source.   ? Aspiration Event - with n/v prior to intubation  P:   -full vent support -f/u abg in one hour & in am -now cxr & in am  -CT chest to eval RUL disease, source for decompensation  -assess D-Dimer -BD's PRN  CARDIOVASCULAR A:  Rule out MI - with risk factors: DM, PVD, HTN, CVA hx PVD HTN P:  -assess  stat EKG, Troponin -consider ECHO pending troponin / EKG results -asa -hold lipitor, norvasc -reduce dose lopressor while in ICU  RENAL A:   CKD - baseline cr ~1.3 P:   -gentle hydration -assess CMP now -trend sr cr  -pharmacy dosing of medications -lasix 60 mg given 9/12, hold further dosing  GASTROINTESTINAL A:    Nausea / Vomiting - abrupt onset 9/9 with associated mental status change P:   -PRN zofran -OGT to LIS x4 hours then clamp  HEMATOLOGIC A:   Anemia - likely in setting of chronic renal disease P:  -follow CBC, trend h/h -consider tx if hgb <7%, active bleed or MI  INFECTIOUS A:   R Foot Osteomyelitis - recent I&D with d/c on IV vanco   Rule out Aspiration PNA - RUL ? Infiltrate with noted air bronchograms P:   -repeat xray of RLE to rule out gas (SEE MRI 11/18/12) -CT chest, abd: unclear reason for decompensation -abx as above -follow cultures -wound care as ordered  ENDOCRINE A:   DM / Hyperglycemia    P:   -SSI -hold lantus  NEUROLOGIC A:   Acute AMS - noted 9/12 afternoon with n/v Hx of Seizures - no noted seizure activity Neuromuscular Disorder - not defined P:   -CT head to r/o CVA -fentanyl gtt with PRN versed    Family updated at bedside.  Pending further work up .     I have personally obtained a history, examined the patient, evaluated laboratory and imaging results, formulated the assessment and plan and placed orders.  CRITICAL CARE: The patient is critically ill with multiple organ systems failure and requires high complexity decision making for assessment and support, frequent evaluation and titration of therapies, application of advanced monitoring technologies and extensive interpretation of multiple databases. Critical Care Time devoted to patient care services described in this note is 60 minutes.   Baltazar Apo, MD, PhD 11/29/2012, 6:25 PM [facility] [phone] or if no answer [phone]

## 2012-11-29 NOTE — Significant Event (Addendum)
ABG    Component Value Date/Time   PHART 7.183* 11/29/2012 1830   PCO2ART 52.6* 11/29/2012 1830   PO2ART 77.5* 11/29/2012 1830   HCO3 19.0* 11/29/2012 1830   TCO2 20.6 11/29/2012 1830   ACIDBASEDEF 8.0* 11/29/2012 1830   O2SAT 92.8 11/29/2012 1830    Will increase RR to 20 and repeat ABG.  Due to frequent need for ABG's will have aline placed.  Chesley Mires, MD 11/29/2012, 6:45 PM

## 2012-11-29 NOTE — Progress Notes (Signed)
CRITICAL VALUE ALERT  Critical value received: ph 7.18  Date of notification: 11/29/2012  Time of notification:  6:42 PM  Critical value read back:yes  Nurse who received alert:  Leonides Sake  MD notified (1st page):  Halford Chessman  Time of first page:  6:42 PM  MD notified (2nd page):  Time of second page:  Responding MD:  Halford Chessman  Time MD responded:  [id number] PM

## 2012-11-29 NOTE — Significant Event (Signed)
Rapid Response Event Note  Overview:called for respiratory distress and decreased Oxygen needs Time Called: 1613 Arrival Time: 1616 Event Type: Respiratory  Initial Focused Assessment:  Upon arrival to patients room, RN and MD at bedside.  Patient on nasal cannula, lethargic but arousable with increased WOB.  Breath sounds wheezes and audible upper airway noise.  RT called for Bipap machine,    Interventions: bipap initiated, patient unable to tolerate started to gag.   ABG drawn, results reviewed. Bipap removed placed on NRB. CCM at bedside.  Patient intubated by Velna Hatchet, NP patient received total of 200 mg Fentanyl, 4 mg versed and 40 mg etomidate.  VSS    Event Summary:  Patient transported to 60M 05 via bed with 100%oxygen via ambu bag.  Report given to bedside RN    at      at          [facility], [place]

## 2012-11-29 NOTE — Progress Notes (Signed)
ANTIBIOTIC CONSULT NOTE - Follow-Up  Pharmacy Consult for Vancomycin/Aztreonam Indication: pneumonia  Allergies  Allergen Reactions  . Codeine Anaphylaxis  . Penicillins Anaphylaxis    Patient Measurements: Height: 5' 10.08" (178 cm) Weight: 202 lb 1.5 oz (91.67 kg) IBW/kg (Calculated) : 73.18 Adjusted Body Weight:   Vital Signs: Temp: 98.7 F (37.1 C) (09/12 0928) Temp src: Oral (09/12 0928) BP: 141/66 mmHg (09/12 0928) Pulse Rate: 87 (09/12 0928) Intake/Output from previous day: 09/11 0701 - 09/12 0700 In: 1610 [P.O.:360; I.V.:1000; IV Piggyback:250] Out: 675 [Urine:675] Intake/Output from this shift: Total I/O In: 480 [P.O.:480] Out: 300 [Urine:300]  Labs:  Recent Labs  11/26/12 1215 11/27/12 0500 11/29/12 0500  WBC 15.8* 8.3 13.6*  HGB 10.0* 8.7* 9.2*  PLT 445* 388 439*  CREATININE 1.46* 1.36* 1.36*   Estimated Creatinine Clearance: 61.7 ml/min (by C-G formula based on Cr of 1.36). No results found for this basename: VANCOTROUGH, Corlis Leak, VANCORANDOM, GENTTROUGH, GENTPEAK, GENTRANDOM, [place], TOBRAPEAK, TOBRARND, AMIKACINPEAK, AMIKACINTROU, AMIKACIN,  in the last 72 hours   Microbiology: Recent Results (from the past 720 hour(s))  CULTURE, BLOOD (ROUTINE X 2)     Status: None   Collection Time    11/17/12 12:40 AM      Result Value Range Status   Specimen Description BLOOD LEFT ARM   Final   Special Requests BOTTLES DRAWN AEROBIC ONLY [place]   Final   Culture  Setup Time     Final   Value: 11/17/2012 04:24     Performed at [facility]   Culture     Final   Value: NO GROWTH 5 DAYS     Performed at [facility]   Report Status 11/23/2012 FINAL   Final  CULTURE, BLOOD (ROUTINE X 2)     Status: None   Collection Time    11/17/12  2:58 AM      Result Value Range Status   Specimen Description BLOOD LEFT ARM   Final   Special Requests BOTTLES DRAWN AEROBIC ONLY [facility]   Final   Culture  Setup Time     Final   Value: 11/17/2012 16:34      Performed at [facility]   Culture     Final   Value: NO GROWTH 5 DAYS     Performed at [facility]   Report Status 11/23/2012 FINAL   Final  MRSA PCR SCREENING     Status: None   Collection Time    11/17/12  5:00 AM      Result Value Range Status   MRSA by PCR NEGATIVE  NEGATIVE Final   Comment:            The GeneXpert MRSA Assay (FDA     approved for NASAL specimens     only), is one component of a     comprehensive MRSA colonization     surveillance program. It is not     intended to diagnose MRSA     infection nor to guide or     monitor treatment for     MRSA infections.  CULTURE, BLOOD (ROUTINE X 2)     Status: None   Collection Time    11/26/12 12:15 PM      Result Value Range Status   Specimen Description BLOOD RIGHT PICC LINE   Final   Special Requests BOTTLES DRAWN AEROBIC ONLY 10CC   Final   Culture  Setup Time     Final   Value:  11/26/2012 16:23     Performed at [facility]   Culture     Final   Value:        BLOOD CULTURE RECEIVED NO GROWTH TO DATE CULTURE WILL BE HELD FOR 5 DAYS BEFORE ISSUING A FINAL NEGATIVE REPORT     Performed at [facility]   Report Status PENDING   Incomplete  CULTURE, BLOOD (ROUTINE X 2)     Status: None   Collection Time    11/26/12 12:20 PM      Result Value Range Status   Specimen Description BLOOD LEFT HAND   Final   Special Requests BOTTLES DRAWN AEROBIC ONLY 5CC   Final   Culture  Setup Time     Final   Value: 11/26/2012 16:23     Performed at [facility]   Culture     Final   Value:        BLOOD CULTURE RECEIVED NO GROWTH TO DATE CULTURE WILL BE HELD FOR 5 DAYS BEFORE ISSUING A FINAL NEGATIVE REPORT     Performed at [facility]   Report Status PENDING   Incomplete  URINE CULTURE     Status: None   Collection Time    11/26/12  3:19 PM      Result Value Range Status   Specimen Description URINE, RANDOM   Final   Special Requests NONE   Final   Culture  Setup Time      Final   Value: 11/26/2012 16:16     Performed at [facility]     Final   Value: 3,000 COLONIES/ML     Performed at [facility]   Culture     Final   Value: INSIGNIFICANT GROWTH     Performed at [facility]   Report Status 11/27/2012 FINAL   Final    Medical History: Past Medical History  Diagnosis Date  . Diabetes mellitus   . Hypertension   . Seizures   . Stroke   . Peripheral vascular disease   . CKD (chronic kidney disease)     Stage II (per 06/2010 notes)  . Neuromuscular disorder   . Pneumonia   . Anemia   . Hypoglycemia 11/25/2012    Assessment: [name] discharged from hospital 9/8 on Cipro for PNA. Returns to ER with CBG 40 per family and 26 per EMS after being given AM insulin. Hypothermic in ER. Start abx for HCAP. Complex PMH including CKD.  Renal function stable today, antibiotic doses appropriate.    Goal of Therapy:  Vancomycin trough level 15-20 mcg/ml  Plan:  1. Continue Aztreonam 1g IV q8hr 2. Continue Vancomyin 1g IV q24h 3. Will defer checking vancomycin trough for now since patient likely to change to po antibiotics in the next day or two.  Uvaldo Rising, BCPS  Clinical Pharmacist Pager [phone]  11/29/2012 10:44 AM

## 2012-11-29 NOTE — Progress Notes (Signed)
Pt. [name] became dislodged with IV team in the process of IV initiation. According to pt. RN, the [name] became dislodged when the IV RN was trying to manipulate the pt. Arm for a IV insertion. RT reported info to the MD in the box and MD states not to replace the [name] at this time.

## 2012-11-29 NOTE — Progress Notes (Signed)
Followup note Shortly after my reassessment of patient, cataract a clinical deterioration, with mental status changes, in acute hypoxemic respiratory failure as his O2 sats were in the mid-80s on 6 L supplemental oxygen via nasal cannula. He was unable to speak in full sentences, moaning, cannot follow commands for me. Patient apparently had been stable, breathing well, until he had an episode of nausea and vomiting this afternoon. It appears after this event he experience a significant deterioration. [facility] response was called, I consulted pulmonary critical care.  Gen.: Patient is tachypnea, altered, not following commands, ill-appearing. Lungs: Extensive by lateral expiratory wheezes, diminished breath sounds, positive bilateral crackles Cardiovascular: Tachycardic regular rate rhythm normal S1-S2 Abdomen: Soft nontender nondistended positive bowel sounds  Plan 1) Acute hypoxemic respiratory failure. I consulted pulmonary critical care. Possible etiologies include aspiration since he had an episode of nausea vomiting this afternoon prior to this deterioration, flashed pulmonary edema (patient had been on IV fluids with NS at 162ml/hour), or worsening pneumonia. Patient will require rapid sequence intubation, repeat chest x-ray, ABG, transfer to intensive care unit.  2) Healthcare associated pneumonia. I discussed case with Dr. Johnnye Sima of infectious disease, recommended obtaining a CT scan, discontinuing aztreonam, starting meropenem.  3) I have called his wife and notified her of the patient's clinical deterioration, the need to transfer to intensive care unit. She stated she was on her way back to the hospital.  45 min spent on patient's care

## 2012-11-29 NOTE — Progress Notes (Signed)
11/29/2012 5:15 PM  This is a late entry.   Pt had an episode of hot flashes, and nausea/vomiting about 1505 while receiving a breathing treatment.  Temperatures were WNL orally, rectally and axillary; other vitals stable as was mentation.  Per RT, pt did not appear to aspirate. MD was made aware. Pt received zofran at this time and nausea/vomiting appeared to resolve.  So did the hot flashes.    Was later approached by MD in the hall around 1545-1600 pertaining to patient respiratory status.  Pt reportedly experiencing increased SOB, audible wheezing and increased work of breathing. Orders received for IV Lasix push one time.  I pulled IV lasix out of pyxis, and when I entered the room, the patient was working very hard to breath, would open his eyes to command but could not articulate his name to me, and oxygen saturations were at 69% on RA.  Pt placed on 5L of oxygen, rapid response and MD were paged to room.  Respiratory also notified.  Shortly therafter, critical care arrived and patient was intubated.  Orders received to transfer to ICU.  Report called to Naida Sleight, Therapist, sports.  Pt transferred by respiratory, rapid response RN, Dr. Lamonte Sakai, and Charge RN for 5W.  Family arrived during episode, was updated by the MD, and walked downstairs.   [name]

## 2012-11-29 NOTE — Procedures (Signed)
Intubation Procedure Note [name] [id number] 10-24-1945  Procedure: Intubation Indications: Respiratory insufficiency  Procedure Details Consent: Risks of procedure as well as the alternatives and risks of each were explained to the (patient/caregiver).  Consent for procedure obtained. Time Out: Verified patient identification, verified procedure, site/side was marked, verified correct patient position, special equipment/implants available, medications/allergies/relevent history reviewed, required imaging and test results available.  Performed  Maximum sterile technique was used including gloves, gown and mask.  Using glidescope, 7.5 ETT placed without difficulty    Evaluation Hemodynamic Status: BP stable throughout; O2 sats: stable throughout Patient's Current Condition: stable Complications: No apparent complications Patient did tolerate procedure well. Chest X-ray ordered to verify placement.  CXR: pending.   BYRUM,ROBERT S. 11/29/2012

## 2012-11-30 ENCOUNTER — Inpatient Hospital Stay (HOSPITAL_COMMUNITY): Payer: Medicare Other

## 2012-11-30 DIAGNOSIS — E119 Type 2 diabetes mellitus without complications: Secondary | ICD-10-CM

## 2012-11-30 LAB — CBC
HCT: 29.2 % — ABNORMAL LOW (ref 39.0–52.0)
MCHC: 31.5 g/dL (ref 30.0–36.0)
Platelets: 383 10*3/uL (ref 150–400)
RDW: 16.4 % — ABNORMAL HIGH (ref 11.5–15.5)

## 2012-11-30 LAB — BLOOD GAS, ARTERIAL
Bicarbonate: 18.7 mEq/L — ABNORMAL LOW (ref 20.0–24.0)
MECHVT: 580 mL
O2 Saturation: 96.6 %
Patient temperature: 98.6
TCO2: 19.9 mmol/L (ref 0–100)
pH, Arterial: 7.331 — ABNORMAL LOW (ref 7.350–7.450)

## 2012-11-30 LAB — BASIC METABOLIC PANEL
BUN: 18 mg/dL (ref 6–23)
GFR calc Af Amer: 59 mL/min — ABNORMAL LOW (ref >90)
GFR calc non Af Amer: 51 mL/min — ABNORMAL LOW (ref >90)
Potassium: 4.3 mEq/L (ref 3.5–5.1)

## 2012-11-30 LAB — PHENYTOIN LEVEL, TOTAL: Phenytoin Lvl: 9.6 ug/mL — ABNORMAL LOW (ref 10.0–20.0)

## 2012-11-30 LAB — POCT I-STAT 3, ART BLOOD GAS (G3+)
Acid-base deficit: 7 mmol/L — ABNORMAL HIGH (ref 0.0–2.0)
pCO2 arterial: 30.9 mmHg — ABNORMAL LOW (ref 35.0–45.0)
pO2, Arterial: 84 mmHg (ref 80.0–100.0)

## 2012-11-30 LAB — GLUCOSE, CAPILLARY
Glucose-Capillary: 88 mg/dL (ref 70–99)
Glucose-Capillary: 84 mg/dL (ref 70–99)
Glucose-Capillary: 84 mg/dL (ref 70–99)
Glucose-Capillary: 108 mg/dL — ABNORMAL HIGH (ref 70–99)

## 2012-11-30 LAB — TROPONIN I: Troponin I: <0.3 ng/mL (ref <0.30)

## 2012-11-30 LAB — ALBUMIN: Albumin: 1.7 g/dL — ABNORMAL LOW (ref 3.5–5.2)

## 2012-11-30 MED ORDER — SODIUM CHLORIDE 0.9 % IV SOLN
1.0000 g | Freq: Two times a day (BID) | INTRAVENOUS | Status: DC
  Administered 2012-11-30 – 2012-12-05 (×11): 1 g via INTRAVENOUS
  Filled 2012-11-30 (×14): qty 1

## 2012-11-30 MED ORDER — CHLORHEXIDINE GLUCONATE 0.12 % MT SOLN
15.0000 mL | Freq: Two times a day (BID) | OROMUCOSAL | Status: DC
  Administered 2012-11-30 (×2): 15 mL via OROMUCOSAL
  Filled 2012-11-30: qty 15

## 2012-11-30 MED ORDER — BIOTENE DRY MOUTH MT LIQD
15.0000 mL | Freq: Four times a day (QID) | OROMUCOSAL | Status: DC
  Administered 2012-11-30 – 2012-12-01 (×4): 15 mL via OROMUCOSAL

## 2012-11-30 MED ORDER — SODIUM CHLORIDE 0.9 % IJ SOLN: 10.0000 mL | INTRAMUSCULAR | Status: DC | PRN

## 2012-11-30 MED ORDER — MIDAZOLAM HCL 2 MG/2ML IJ SOLN: 1.0000 mg | INTRAMUSCULAR | Status: DC | PRN

## 2012-11-30 MED ORDER — LACTULOSE 10 GM/15ML PO SOLN
30.0000 g | Freq: Once | ORAL | Status: AC
  Administered 2012-11-30: 30 g
  Filled 2012-11-30: qty 45

## 2012-11-30 MED ORDER — DEXTROSE 50 % IV SOLN
25.0000 mL | INTRAVENOUS | Status: DC | PRN
  Administered 2012-11-30: 25 mL via INTRAVENOUS
  Filled 2012-11-30: qty 50

## 2012-11-30 MED ORDER — FENTANYL CITRATE 0.05 MG/ML IJ SOLN: 50.0000 ug | INTRAMUSCULAR | Status: DC | PRN

## 2012-11-30 MED ORDER — FUROSEMIDE 10 MG/ML IJ SOLN
40.0000 mg | Freq: Two times a day (BID) | INTRAMUSCULAR | Status: DC
  Administered 2012-11-30 – 2012-12-02 (×5): 40 mg via INTRAVENOUS
  Filled 2012-11-30 (×7): qty 4

## 2012-11-30 MED ORDER — VITAL AF 1.2 CAL PO LIQD: 1000.0000 mL | ORAL | Status: DC

## 2012-11-30 MED ORDER — VITAL HIGH PROTEIN PO LIQD
1000.0000 mL | ORAL | Status: DC
  Administered 2012-11-30: 1000 mL
  Filled 2012-11-30 (×4): qty 1000

## 2012-11-30 MED ORDER — BISACODYL 5 MG PO TBEC
10.0000 mg | DELAYED_RELEASE_TABLET | Freq: Every day | ORAL | Status: DC | PRN
  Filled 2012-11-30: qty 2

## 2012-11-30 NOTE — Progress Notes (Signed)
ANTIBIOTIC CONSULT NOTE - FOLLOW UP  Pharmacy Consult for Vancomycin and Meropenem Indication: pneumonia and right foot infection  Allergies  Allergen Reactions  . Codeine Anaphylaxis  . Penicillins Anaphylaxis    Patient Measurements: Height: 5' 10.08" (178 cm) Weight: 202 lb 1.5 oz (91.67 kg) IBW/kg (Calculated) : 73.18  Vital Signs: Temp: 98.5 F (36.9 C) (09/13 1134) Temp src: Oral (09/13 1134) BP: 141/73 mmHg (09/13 1529) Pulse Rate: 97 (09/13 1529) Intake/Output from previous day: 09/12 0701 - 09/13 0700 In: 1070.8 [P.O.:480; I.V.:490.8; IV Piggyback:100] Out: 1315 [Urine:1315] Intake/Output from this shift: Total I/O In: -  Out: 2000 [Urine:2000]  Labs:  Recent Labs  11/29/12 0500 11/29/12 1740 11/30/12 0353  WBC 13.6* 13.2* 10.9*  HGB 9.2* 9.2* 9.2*  PLT 439* 431* 383  CREATININE 1.36* 1.36* 1.40*   Estimated Creatinine Clearance: 60 ml/min (by C-G formula based on Cr of 1.4).  Recent Labs  11/30/12 1430  [place] 17.7     Microbiology: Recent Results (from the past 720 hour(s))  CULTURE, BLOOD (ROUTINE X 2)     Status: None   Collection Time    11/17/12 12:40 AM      Result Value Range Status   Specimen Description BLOOD LEFT ARM   Final   Special Requests BOTTLES DRAWN AEROBIC ONLY [facility]   Final   Culture  Setup Time     Final   Value: 11/17/2012 04:24     Performed at [facility]   Culture     Final   Value: NO GROWTH 5 DAYS     Performed at [facility]   Report Status 11/23/2012 FINAL   Final  CULTURE, BLOOD (ROUTINE X 2)     Status: None   Collection Time    11/17/12  2:58 AM      Result Value Range Status   Specimen Description BLOOD LEFT ARM   Final   Special Requests BOTTLES DRAWN AEROBIC ONLY [facility]   Final   Culture  Setup Time     Final   Value: 11/17/2012 16:34     Performed at [facility]   Culture     Final   Value: NO GROWTH 5 DAYS     Performed at [facility]   Report Status  11/23/2012 FINAL   Final  MRSA PCR SCREENING     Status: None   Collection Time    11/17/12  5:00 AM      Result Value Range Status   MRSA by PCR NEGATIVE  NEGATIVE Final   Comment:            The GeneXpert MRSA Assay (FDA     approved for NASAL specimens     only), is one component of a     comprehensive MRSA colonization     surveillance program. It is not     intended to diagnose MRSA     infection nor to guide or     monitor treatment for     MRSA infections.  CULTURE, BLOOD (ROUTINE X 2)     Status: None   Collection Time    11/26/12 12:15 PM      Result Value Range Status   Specimen Description BLOOD RIGHT PICC LINE   Final   Special Requests BOTTLES DRAWN AEROBIC ONLY [facility]   Final   Culture  Setup Time     Final   Value: 11/26/2012 16:23     Performed at [facility]   Culture     Final   Value:        BLOOD CULTURE RECEIVED NO GROWTH TO DATE CULTURE WILL BE HELD FOR 5 DAYS BEFORE ISSUING A FINAL NEGATIVE REPORT     Performed at [facility]   Report Status PENDING   Incomplete  CULTURE, BLOOD (ROUTINE X 2)     Status: None   Collection Time    11/26/12 12:20 PM      Result Value Range Status   Specimen Description BLOOD LEFT HAND   Final   Special Requests BOTTLES DRAWN AEROBIC ONLY [facility]   Final   Culture  Setup Time     Final   Value: 11/26/2012 16:23     Performed at [facility]   Culture     Final   Value:        BLOOD CULTURE RECEIVED NO GROWTH TO DATE CULTURE WILL BE HELD FOR 5 DAYS BEFORE ISSUING A FINAL NEGATIVE REPORT     Performed at [facility]   Report Status PENDING   Incomplete  URINE CULTURE     Status: None   Collection Time    11/26/12  3:19 PM      Result Value Range Status   Specimen Description URINE, RANDOM   Final   Special Requests NONE   Final   Culture  Setup Time     Final   Value: 11/26/2012 16:16     Performed at [facility]     Final   Value: 3,000 COLONIES/ML      Performed at [facility]   Culture     Final   Value: INSIGNIFICANT GROWTH     Performed at [facility]   Report Status 11/27/2012 FINAL   Final  MRSA PCR SCREENING     Status: None   Collection Time    11/29/12  5:22 PM      Result Value Range Status   MRSA by PCR NEGATIVE  NEGATIVE Final   Comment:            The GeneXpert MRSA Assay (FDA     approved for NASAL specimens     only), is one component of a     comprehensive MRSA colonization     surveillance program. It is not     intended to diagnose MRSA     infection nor to guide or     monitor treatment for     MRSA infections.    Anti-infectives   Start     Dose/Rate Route Frequency Ordered Stop   11/29/12 1700  meropenem (MERREM) 1 g in sodium chloride 0.9 % 100 mL IVPB  Status:  Discontinued     1 g 200 mL/hr over 30 Minutes Intravenous Every 12 hours 11/29/12 1542 11/29/12 1602   11/29/12 1700  meropenem (MERREM) 1 g in sodium chloride 0.9 % 100 mL IVPB     1 g 200 mL/hr over 30 Minutes Intravenous 3 times per day 11/29/12 1602     11/27/12 1515  vancomycin (VANCOCIN) IVPB 1000 mg/200 mL premix     1,000 mg 200 mL/hr over 60 Minutes Intravenous Every 24 hours 11/26/12 1601     11/26/12 1600  aztreonam (AZACTAM) 2 g in dextrose 5 % 50 mL IVPB  Status:  Discontinued     2 g 100 mL/hr over 30 Minutes Intravenous Every 8 hours 11/26/12 1536 11/29/12 1541  11/26/12 1445  imipenem-cilastatin (PRIMAXIN) 500 mg in sodium chloride 0.9 % 100 mL IVPB     500 mg 200 mL/hr over 30 Minutes Intravenous To Emergency Dept 11/26/12 1429 11/26/12 1729   11/26/12 1430  vancomycin (VANCOCIN) IVPB 1000 mg/200 mL premix     1,000 mg 200 mL/hr over 60 Minutes Intravenous  Once 11/26/12 1429 11/26/12 1613      Assessment: 67 year old male on therapy with Vancomycin and Meropenem for pneumonia and foot infection.  His Vancomycin trough remains therapeutic on 1gm IV q24h.  Based on his Vancomycin clearance, it appears his  creatinine clearance overestimates his ability to clear medications.  Goal of Therapy:  Vancomycin trough level 15-20 mcg/ml  Plan:  Continue with Vancomycin 1gm IV q24h Decrease Meropenem to 1gm IV q12h Monitor renal function closely Follow available culture results  Legrand Como, Pharm.D., BCPS, AAHIVP Clinical Pharmacist Phone: [phone] or [phone] 11/30/2012, 4:03 PM

## 2012-11-30 NOTE — Progress Notes (Addendum)
INITIAL NUTRITION ASSESSMENT  DOCUMENTATION CODES Per approved criteria  -Not Applicable   INTERVENTION:  1. Start Vital High Protein @ 30 ml/hr and increase by 10 ml every 4 hours to goal rate of 70 ml/hr.   TF regimen will provide: 1680 kcal and 147 grams protein.   NUTRITION DIAGNOSIS: Inadequate oral intake related to inability to eat as evidenced by NPO status.  Goal: Pt to meet >/= 90% of their estimated nutrition needs   Monitor:  Vent status, TF initiation, weight trend, labs   Reason for Assessment: Consult received to initiate and manage enteral nutrition support.  67 y.o. male  Admitting Dx: Acute respiratory failure  ASSESSMENT: Pt admitted with hypoglycemia and decompensated with N/V and respiratory distress on 9/12 and was intubated. Pt with hx of L BKA and toe amputation on right foot due to diabetic foot ulcers.  Patient is currently intubated on ventilator support.  MV: 8.8 L/min Temp:Temp (24hrs), Avg:98.2 F (36.8 C), Min:98 F (36.7 C), Max:98.5 F (36.9 C)   Height: Ht Readings from Last 1 Encounters:  11/28/12 5' 10.08" (1.78 m)    Weight: Wt Readings from Last 1 Encounters:  11/28/12 202 lb 1.5 oz (91.67 kg)    Ideal Body Weight: 71 kg   % Ideal Body Weight: 129%  Wt Readings from Last 10 Encounters:  11/28/12 202 lb 1.5 oz (91.67 kg)  11/22/12 198 lb 6.6 oz (90 kg)  10/23/12 178 lb 9.2 oz (81 kg)  10/23/12 178 lb 9.2 oz (81 kg)  10/23/12 178 lb 9.2 oz (81 kg)  10/09/12 177 lb (80.287 kg)  10/09/12 177 lb (80.287 kg)  10/07/12 192 lb 14.4 oz (87.499 kg)  10/09/11 186 lb 15.2 oz (84.8 kg)    Usual Body Weight: 177 lb  % Usual Body Weight: >100%  BMI:  Body mass index is 28.93 kg/(m^2). Adjusted BMI for usual weight: 27.2 - overweight  Estimated Nutritional Needs: Kcal: [id number] Protein: 120-140 Fluid: > 2 L/day  Skin: L BKA  Diet Order: Carb Control  EDUCATION NEEDS: -No education needs identified at this  time   Intake/Output Summary (Last 24 hours) at 11/30/12 0948 Last data filed at 11/30/12 0900  Gross per 24 hour  Intake 590.84 ml  Output   1465 ml  Net -874.16 ml    Last BM: 9/10   Labs:   Recent Labs Lab 11/29/12 0500 11/29/12 1740 11/30/12 0353  NA 135 133* 136  K 4.8 5.1 4.3  CL 107 106 108  CO2 19 19 18*  BUN 17 19 18   CREATININE 1.36* 1.36* 1.40*  CALCIUM 8.2* 8.0* 8.4  GLUCOSE 193* 153* 96    CBG (last 3)   Recent Labs  11/30/12 0148 11/30/12 0406 11/30/12 0712  GLUCAP 111* 88 84    Scheduled Meds: . aspirin  325 mg Oral Daily  . heparin  5,000 Units Subcutaneous Q8H  . insulin aspart  0-15 Units Subcutaneous Q4H  . latanoprost  1 drop Both Eyes QHS  . meropenem (MERREM) IV  1 g Intravenous Q8H  . metoprolol  12.5 mg Oral BID  . pantoprazole (PROTONIX) IV  40 mg Intravenous Q24H  . silver sulfADIAZINE   Topical Daily  . tamsulosin  0.4 mg Oral QPC supper  . timolol  1 drop Both Eyes Daily  . vancomycin  1,000 mg Intravenous Q24H    Continuous Infusions: . sodium chloride 1,000 mL (11/30/12 0900)  . fentaNYL infusion INTRAVENOUS 75 mcg/hr (11/30/12 0900)  Past Medical History  Diagnosis Date  . Diabetes mellitus   . Hypertension   . Seizures   . Stroke   . Peripheral vascular disease   . CKD (chronic kidney disease)     Stage II (per 06/2010 notes)  . Neuromuscular disorder   . Pneumonia   . Anemia   . Hypoglycemia 11/25/2012    Past Surgical History  Procedure Laterality Date  . Colon surgery      partial colectomy  . Eye surgery      bilat cataract surgery  . Below knee leg amputation      L  . Toe amputation      R two  . Amputation  10/10/2011    Procedure: AMPUTATION DIGIT;  Surgeon: Newt Minion, MD;  Location: [facility];  Service: Orthopedics;  Laterality: Right;  Right Foot 4th Toe Amputation  . Orif toe fracture Right 10/09/2012    Procedure: OPEN REDUCTION INTERNAL FIXATION (ORIF) METATARSAL (TOE) FRACTURE;   Surgeon: Newt Minion, MD;  Location: [facility];  Service: Orthopedics;  Laterality: Right;  Right Foot Base 1st Metatarsal and Medial Cuneoform Excision, Internal Fixation, Antibiotic Beads  . I&d extremity Right 10/28/2012    Procedure: IRRIGATION AND DEBRIDEMENT EXTREMITY;  Surgeon: Newt Minion, MD;  Location: [facility];  Service: Orthopedics;  Laterality: Right;  Irrigation and Debridement Right Foot, Remove Deep Hardware, Place Antibiotic Beads  . Vascular surgery      [place], [place], [place] Pager [phone] After Hours Pager

## 2012-11-30 NOTE — Procedures (Signed)
Extubation Procedure Note  Patient Details:   Name: [name]. DOB: [birth year] MRN: [id number]   Airway Documentation:     Evaluation  O2 sats: currently acceptable Complications: No apparent complications Patient did tolerate procedure well. Bilateral Breath Sounds: Clear;Diminished Suctioning: Oral;Airway Yes Patient self-extubated himself. Placed patient on 4lpm nasal cannula with Sp02=97%. No stridor heard in upper airway [name] 11/30/2012, 9:28 PM

## 2012-11-30 NOTE — Progress Notes (Signed)
[facility] Progress Note Patient Name: [name]. DOB: [birth year] MRN: [id number]  Date of Service  11/30/2012   HPI/Events of Note   A line pulled out while iv team working with patient  eICU Interventions  Currently on 40% FiO2 and not requiring pressors, will hold off on replacing unless condition changes.   Intervention Category Minor Interventions: OtherReginia Forts K. 11/30/2012, 12:14 AM

## 2012-11-30 NOTE — Progress Notes (Signed)
140 ml of fentanyl wasted from IV bag 2/2 to a new PICC line being placed and a new bag of fentanyl being started. The Bags concentration was 2543mcg/250ml. Waste was witnessed by Ballard Russell.

## 2012-11-30 NOTE — Progress Notes (Signed)
140 cc of fentanyl wasted in the sink and was witnessed by Lenor Coffin, RN

## 2012-11-30 NOTE — Consult Note (Signed)
  Patient is seen in followup for his right foot. Patient has bilateral pneumonia, blood cultures are negative.  On examination of his right foot shows significant improvement from the last examination. He has good wrinkling of the skin there is no swelling no cellulitis no redness no warmth no signs of infection. The wounds have healed quite nicely the foot is plantigrade.  I feel the patient can begin weightbearing on the right foot without restrictions. I feel with the patient's clinical findings the radiographic and MRI findings are more consistent with Charcot arthropathy than osteomyelitis.

## 2012-11-30 NOTE — Progress Notes (Signed)
PULMONARY  / CRITICAL CARE MEDICINE  Name: [name]. MRN: [id number] DOB: 1946/01/17    ADMISSION DATE:  11/26/2012 CONSULTATION DATE:  11/29/12  REFERRING MD :  Dr. Coralyn Pear PRIMARY SERVICE: [facility]-->PCCM  CHIEF COMPLAINT:  Acute Respiratory Distress  BRIEF PATIENT DESCRIPTION: [name] with recent d/c on 9/8 for PNA.  Readmitted 9/9 with hypoglycemia. 9/12 afternoon acutely decompensated with n/v & respiratory distress requiring mech ventilation for resp +metab acidosis.  PCCM consulted for assistance   [name] with PMH of HTN, Seizures, CVA, PVD s/p AKA, Neuromuscular disorder (not defined), CKD and DM with recent I&D of R foot ulceration per Dr. [name] (d/c'd on 8/15).  Readmitted 8/30-9/8 per [facility] for HCAP and continued wound care for R foot.  MRI 9/1 demonstrated diffuse soft tissue infection & osteo.  Discharged home on oral cipro & IV vanco.  Returned to [facility] ER on 9/9 with profound hypoglycemia - pt was on both lantus and 70/30 at time of discharge.       SIGNIFICANT EVENTS / STUDIES:  8/15 - discharge after I&D per Dr. [name] for R lateral foot ulcer 8/30 - 9/8 Admit with HCAP, R toe wound ......................................................................................................... 9/09 - Admit with hypoglycemia.  9/12 - Acute decompensation with n/v and subsequent change in mental status.  Pt lethargic, denied pain.  9/12 CT chest - BLL consolidation with effusions  LINES / TUBES: OETT 9/12>>> PICC 8/12 >>>  CULTURES: UC 9/9>>>ng BCx2 9/9>>>ng  ANTIBIOTICS: Meropenem 9/9>>> Vanco 9/9>>>   SUBJECTIVE: denies CP, dyspnea afebrile  VITAL SIGNS: Temp:  [98 F (36.7 C)-98.5 F (36.9 C)] 98.2 F (36.8 C) (09/13 0738) Pulse Rate:  [76-107] 96 (09/13 0800) Resp:  [14-23] 20 (09/13 0800) BP: (107-184)/(55-86) 119/60 mmHg (09/13 0800) SpO2:  [94 %-100 %] 100 % (09/13 0800) Arterial Line BP: (165-234)/(70-102) 181/80 mmHg (09/12 2300) FiO2 (%):  [40 %]  40 % (09/13 0307)  HEMODYNAMICS:    VENTILATOR SETTINGS: Vent Mode:  [-] PRVC FiO2 (%):  [40 %] 40 % Set Rate:  [16 bmp-20 bmp] 20 bmp Vt Set:  [580 mL] 580 mL PEEP:  [5 cmH20] 5 cmH20 Plateau Pressure:  [22 [id number] cmH20] 22 cmH20  INTAKE / OUTPUT: Intake/Output     09/12 0701 - 09/13 0700 09/13 0701 - 09/14 0700   P.O. 480    I.V. (mL/kg) 490.8 (5.4)    IV Piggyback 100    Total Intake(mL/kg) 1070.8 (11.7)    Urine (mL/kg/hr) 1315 (0.6) 450 (1.9)   Total Output 1315 450   Net -244.2 -450          PHYSICAL EXAMINATION: General:  Chronically ill adult male intubated, no acute distress  Neuro:  Lethargic, aroused, minimal interaction  HEENT:  Mm pink/moist, no jvd Cardiovascular:  s1s2 rrr, distant tones Lungs:  resp's even/non-labored, lungs bilaterally decreased Abdomen:  Soft, distended, decreased bs  Musculoskeletal:  No acute deformities, R BKA Skin:  Right medial foot 0.5 cm ulcer  LABS:  CBC Recent Labs     11/29/12  0500  11/29/12  1740  11/30/12  0353  WBC  13.6*  13.2*  10.9*  HGB  9.2*  9.2*  9.2*  HCT  27.9*  29.3*  29.2*  PLT  439*  431*  383   Coag's No results found for this basename: APTT, INR,  in the last 72 hours BMET Recent Labs     11/29/12  0500  11/29/12  1740  11/30/12  0353  NA  135  133*  136  K  4.8  5.1  4.3  CL  107  106  108  CO2  19  19  18*  BUN  17  19  18   CREATININE  1.36*  1.36*  1.40*  GLUCOSE  193*  153*  96   Electrolytes Recent Labs     11/29/12  0500  11/29/12  1740  11/30/12  0353  CALCIUM  8.2*  8.0*  8.4   Sepsis Markers No results found for this basename: LACTICACIDVEN, PROCALCITON, O2SATVEN,  in the last 72 hours ABG Recent Labs     11/29/12  1830  11/29/12  2006  11/30/12  0353  PHART  7.183*  7.267*  7.331*  PCO2ART  52.6*  42.0  36.5  PO2ART  77.5*  103.0*  84.6   Liver Enzymes Recent Labs     11/29/12  0500  11/29/12  1740  11/30/12  0353  AST   --   19   --   ALT   --   13    --   ALKPHOS   --   111   --   BILITOT   --   0.1*   --   ALBUMIN  1.7*  1.7*  1.7*   Cardiac Enzymes Recent Labs     11/29/12  1740  11/29/12  2331  11/30/12  0353  TROPONINI  <0.30  <0.30  <0.30  PROBNP  7199.0*   --    --    Glucose Recent Labs     11/29/12  1713  11/29/12  1948  11/30/12  0040  11/30/12  0148  11/30/12  0406  11/30/12  0712  GLUCAP  141*  139*  68*  111*  88  84    Imaging Ct Abdomen Pelvis Wo Contrast  11/29/2012   CLINICAL DATA:  Abdominal distention, nausea, vomiting. Altered mental status.  EXAM: CT CHEST, ABDOMEN AND PELVIS WITHOUT CONTRAST  TECHNIQUE: Multidetector CT imaging of the chest, abdomen and pelvis was performed following the standard protocol without IV contrast.  COMPARISON:  None.  FINDINGS: CT CHEST FINDINGS  There are large bilateral pleural effusions. Compressive atelectasis in the lower lobes. Airspace opacities in both upper lobes, right greater than left. This concerning for pneumonia. Heart is mildly enlarged. Small pericardial effusion. Aorta is normal caliber. Small scattered mediastinal lymph nodes, none pathologically enlarged. No axillary adenopathy.  Tracheostomy tube tip is in the lower trachea above the carina. NG tube enters the stomach.  No acute bony abnormality.  Multiple old right rib fractures.  CT ABDOMEN AND PELVIS FINDINGS  As mentioned, NG tube enters the stomach. Large stool burden throughout the colon, particularly the right colon and transverse colon. Small bowel is decompressed.  Liver, spleen, pancreas, adrenals and kidneys have a grossly unremarkable unenhanced appearance. Aorta and iliac vessels are normal caliber. Small left inguinal hernia containing fat. Urinary bladder is grossly unremarkable. Prostate mildly prominent. No free fluid, free air or adenopathy.  Edema/stranding throughout the subcutaneous soft tissues of the abdomen, pelvis and upper aspect of the lower extremities. No acute bony abnormality.  Apparent old healed trauma to the right hemipelvis.  IMPRESSION: CT CHEST IMPRESSION  Large bilateral pleural effusions with compressive atelectasis in both lungs. Airspace disease in the upper lobes, right greater the left concerning for pneumonia.  Mild cardiomegaly. Small pericardial effusion.  CT ABDOMEN AND PELVIS IMPRESSION  Large stool burden in the right  colon and transverse colon.  No definite acute process in the abdomen or pelvis.   Electronically Signed   By: Rolm Baptise M.D.   On: 11/29/2012 22:17   Dg Chest 2 View  11/29/2012   *RADIOLOGY REPORT*  Clinical Data: Shortness of breath  CHEST - 2 VIEW  Comparison: 11/26/2012  Findings: Cardiac shadow is stable.  A right-sided central venous line is again identified and stable.  The lungs again demonstrate patchy infiltrates bilaterally.  These are slightly more prominent than that seen on the prior exam.  These are most notable in the apices.  IMPRESSION: Slight increase in degree of bilateral infiltrates when compared with the prior exam.   Original Report Authenticated By: Inez Catalina, M.D.   Dg Tibia/fibula Right  11/29/2012   CLINICAL DATA:  Right lower leg swelling. [name]  EXAM: RIGHT TIBIA AND FIBULA - 2 VIEW  COMPARISON:  06/30/2009.  FINDINGS: Extensive lower extremity soft tissue swelling, nonspecific. There is new fragmentation of the medial malleolus, with indistinct margins. New, but chronic appearing, periosteal changes to the lateral cortex lower tibia. Extensive demineralization of the midfoot. No acute fracture. Extensive vascular calcification.  IMPRESSION: New (since 2011) erosion/ fragmentation of the medial malleolus is concerning for osseous infection given ipsilateral foot osteomyelitis/abscess.   Electronically Signed   By: Jorje Guild   On: 11/29/2012 22:59   Ct Head Wo Contrast  11/29/2012   CLINICAL DATA:  Altered mental status.  EXAM: CT HEAD WITHOUT CONTRAST  TECHNIQUE: Contiguous axial images were obtained from the  base of the skull through the vertex without intravenous contrast.  COMPARISON:  MRI 09/30/2010  FINDINGS: Chronic microvascular changes throughout the deep white matter. Old left occipital infarct. No acute intracranial abnormality. Specifically, no hemorrhage, hydrocephalus, mass lesion, acute infarction, or significant intracranial injury. No acute calvarial abnormality. Visualized paranasal sinuses and mastoids clear. Orbital soft tissues unremarkable.  IMPRESSION: Old left occipital infarct. Chronic small vessel disease changes. No acute findings or change.   Electronically Signed   By: Rolm Baptise M.D.   On: 11/29/2012 22:10   Ct Chest Wo Contrast  11/29/2012   CLINICAL DATA:  Abdominal distention, nausea, vomiting. Altered mental status.  EXAM: CT CHEST, ABDOMEN AND PELVIS WITHOUT CONTRAST  TECHNIQUE: Multidetector CT imaging of the chest, abdomen and pelvis was performed following the standard protocol without IV contrast.  COMPARISON:  None.  FINDINGS: CT CHEST FINDINGS  There are large bilateral pleural effusions. Compressive atelectasis in the lower lobes. Airspace opacities in both upper lobes, right greater than left. This concerning for pneumonia. Heart is mildly enlarged. Small pericardial effusion. Aorta is normal caliber. Small scattered mediastinal lymph nodes, none pathologically enlarged. No axillary adenopathy.  Tracheostomy tube tip is in the lower trachea above the carina. NG tube enters the stomach.  No acute bony abnormality.  Multiple old right rib fractures.  CT ABDOMEN AND PELVIS FINDINGS  As mentioned, NG tube enters the stomach. Large stool burden throughout the colon, particularly the right colon and transverse colon. Small bowel is decompressed.  Liver, spleen, pancreas, adrenals and kidneys have a grossly unremarkable unenhanced appearance. Aorta and iliac vessels are normal caliber. Small left inguinal hernia containing fat. Urinary bladder is grossly unremarkable. Prostate  mildly prominent. No free fluid, free air or adenopathy.  Edema/stranding throughout the subcutaneous soft tissues of the abdomen, pelvis and upper aspect of the lower extremities. No acute bony abnormality. Apparent old healed trauma to the right hemipelvis.  IMPRESSION: CT CHEST IMPRESSION  Large bilateral pleural effusions with compressive atelectasis in both lungs. Airspace disease in the upper lobes, right greater the left concerning for pneumonia.  Mild cardiomegaly. Small pericardial effusion.  CT ABDOMEN AND PELVIS IMPRESSION  Large stool burden in the right colon and transverse colon.  No definite acute process in the abdomen or pelvis.   Electronically Signed   By: Rolm Baptise M.D.   On: 11/29/2012 22:17   Dg Chest Port 1 View  11/30/2012   ADDENDUM REPORT: 11/30/2012 01:27  ADDENDUM: Right PICC line is in place. The tip is in the upper to mid right atrium, approximately 3 cm deep to the cavoatrial junction.   Electronically Signed   By: Rolm Baptise M.D.   On: 11/30/2012 01:27   11/30/2012   CLINICAL DATA:  Line placement.  EXAM: PORTABLE CHEST - 1 VIEW  COMPARISON:  11/29/2012  FINDINGS: Endotracheal tube is 3 cm above the carina. Severe diffuse bilateral airspace disease and layering effusions. No real change since prior study. Heart is normal size. No acute bony abnormality.  IMPRESSION: Endotracheal tube and NG tube in stable position.  Stable diffuse bilateral airspace disease and bilateral effusions.  Electronically Signed: By: Rolm Baptise M.D. On: 11/30/2012 01:06   Dg Chest Port 1 View  11/29/2012   *RADIOLOGY REPORT*  Clinical Data: Endotracheal tube placement.  PORTABLE CHEST - 1 VIEW  Comparison: Same date.  Findings: Endotracheal tube is seen projected over tracheal air shadow with distal tip 2 cm above the carina.  Stable cardiomediastinal silhouette.  Significantly increased bilateral basilar opacities is noted consistent with worsening edema or pneumonia.  Associated pleural  effusions are noted.  Right-sided PICC line is noted with tip in expected position of the SVC. Nasogastric tube passes through esophagus into stomach.  IMPRESSION: Endotracheal tube in grossly good position.  Worsening bilateral basilar opacities consistent with edema or possibly pneumonia.   Original Report Authenticated By: Marijo Conception.,  M.D.   Dg Abd Portable 1v  11/29/2012   CLINICAL DATA:  67 year old male NG tube placement.  EXAM: PORTABLE ABDOMEN - 1 VIEW  COMPARISON:  None.  FINDINGS: Portable AP supine view at 1904 hrs. Enteric tube courses to the left upper quadrant, tip at the level of the gastric body and side hole to level of the gastric fundus. Visualized bowel gas pattern is non obstructed.  IMPRESSION: NG tube tip at the level of the gastric body.   Electronically Signed   By: Lars Pinks M.D.   On: 11/29/2012 19:13    ASSESSMENT / PLAN:  PULMONARY A: Acute Respiratory Failure - unclear etiology, wheezing with upper airway predominance prior to intubation concerning for neuro source.   ? Aspiration Event - with n/v prior to intubation  P:   -SBTs, but hold off extubation x 24h - D-Dimer pos, chk duplex -BD's PRN  CARDIOVASCULAR A:   - with risk factors: DM, PVD, HTN, CVA hx PVD HTN P:  -chk  ECHO -high BNP & edema -asa -hold lipitor, norvasc -reduce dose lopressor while in ICU  RENAL A:   CKD - baseline cr ~1.3 P:   -gentle hydration -pharmacy dosing of medications -lasix 40 q 12h as BP permits  GASTROINTESTINAL A:   Nausea / Vomiting - abrupt onset 9/9 with associated mental status change P:   -PRN zofran -Start TFs  HEMATOLOGIC A:   Anemia - likely in setting of chronic renal disease P:  -follow CBC, trend h/h -consider tx if hgb <7%,  active bleed or MI  INFECTIOUS A:   R Foot Osteomyelitis - recent I&D with d/c on IV vanco   Rule out Aspiration PNA - RUL ? Infiltrate with noted air bronchograms  xray of RLE  (SEE MRI 11/18/12) - new  fragmentation of the medial malleolus, with indistinct margins. New, but chronic appearing, periosteal changes to the  lateral cortex lower tibia  P:   -abx as above -follow cultures -wound care as ordered, compression ok -will ask Dr [name] to reconsult  ENDOCRINE A:   DM / Hyperglycemia    P:   -SSI -hold lantus  NEUROLOGIC A:   Acute AMS - noted 9/12 afternoon with n/v, -CT head -old CVA Hx of Seizures - no noted seizure activity Neuromuscular Disorder - not defined P:   -fentanyl gtt with PRN versed -try intermittent   Wife updated at bedside.     I have personally obtained a history, examined the patient, evaluated laboratory and imaging results, formulated the assessment and plan and placed orders.  CRITICAL CARE: The patient is critically ill with multiple organ systems failure and requires high complexity decision making for assessment and support, frequent evaluation and titration of therapies, application of advanced monitoring technologies and extensive interpretation of multiple databases. Critical Care Time devoted to patient care services described in this note is 45 minutes.   Kara Mead MD. Shade Flood. [facility] Pager [phone] If no response call [phone]   11/30/2012, 9:39 AM

## 2012-12-01 ENCOUNTER — Inpatient Hospital Stay (HOSPITAL_COMMUNITY): Payer: Medicare Other

## 2012-12-01 DIAGNOSIS — M7989 Other specified soft tissue disorders: Secondary | ICD-10-CM

## 2012-12-01 DIAGNOSIS — G9340 Encephalopathy, unspecified: Secondary | ICD-10-CM

## 2012-12-01 DIAGNOSIS — I059 Rheumatic mitral valve disease, unspecified: Secondary | ICD-10-CM

## 2012-12-01 LAB — BASIC METABOLIC PANEL
BUN: 17 mg/dL (ref 6–23)
CO2: 20 mEq/L (ref 19–32)
Calcium: 8.3 mg/dL — ABNORMAL LOW (ref 8.4–10.5)
Chloride: 108 mEq/L (ref 96–112)
Creatinine, Ser: 1.38 mg/dL — ABNORMAL HIGH (ref 0.50–1.35)

## 2012-12-01 LAB — CBC
HCT: 32.1 % — ABNORMAL LOW (ref 39.0–52.0)
MCH: 28.3 pg (ref 26.0–34.0)
MCHC: 32.4 g/dL (ref 30.0–36.0)
MCV: 87.2 fL (ref 78.0–100.0)
Platelets: 315 10*3/uL (ref 150–400)
RDW: 16.2 % — ABNORMAL HIGH (ref 11.5–15.5)
WBC: 8.8 10*3/uL (ref 4.0–10.5)

## 2012-12-01 LAB — BLOOD GAS, ARTERIAL
Acid-base deficit: 5.4 mmol/L — ABNORMAL HIGH (ref 0.0–2.0)
Drawn by: 31101
O2 Saturation: 96.4 %
Patient temperature: 98.6
TCO2: 19.5 mmol/L (ref 0–100)
pCO2 arterial: 30.7 mmHg — ABNORMAL LOW (ref 35.0–45.0)

## 2012-12-01 LAB — GLUCOSE, CAPILLARY
Glucose-Capillary: 106 mg/dL — ABNORMAL HIGH (ref 70–99)
Glucose-Capillary: 107 mg/dL — ABNORMAL HIGH (ref 70–99)
Glucose-Capillary: 109 mg/dL — ABNORMAL HIGH (ref 70–99)
Glucose-Capillary: 247 mg/dL — ABNORMAL HIGH (ref 70–99)

## 2012-12-01 MED ORDER — POTASSIUM CHLORIDE 20 MEQ/15ML (10%) PO LIQD: 20.0000 meq | Freq: Once | ORAL | Status: DC

## 2012-12-01 MED ORDER — POTASSIUM CHLORIDE CRYS ER 20 MEQ PO TBCR
20.0000 meq | EXTENDED_RELEASE_TABLET | Freq: Two times a day (BID) | ORAL | Status: DC
  Administered 2012-12-01 – 2012-12-02 (×3): 20 meq via ORAL
  Filled 2012-12-01 (×5): qty 1

## 2012-12-01 MED ORDER — METOPROLOL TARTRATE 25 MG PO TABS
25.0000 mg | ORAL_TABLET | Freq: Two times a day (BID) | ORAL | Status: DC
  Administered 2012-12-01 (×2): 25 mg via ORAL
  Filled 2012-12-01 (×4): qty 1

## 2012-12-01 NOTE — Progress Notes (Addendum)
PULMONARY  / CRITICAL CARE MEDICINE  Name: [name]. MRN: [id number] DOB: 05-05-1945    ADMISSION DATE:  11/26/2012 CONSULTATION DATE:  11/29/12  REFERRING MD :  Dr. Coralyn Pear PRIMARY SERVICE: [facility]-->PCCM  CHIEF COMPLAINT:  Acute Respiratory Distress  BRIEF PATIENT DESCRIPTION: [name] with recent d/c on 9/8 for PNA.  Readmitted 9/9 with hypoglycemia. 9/12 afternoon acutely decompensated with n/v & respiratory distress requiring mech ventilation for resp +metab acidosis.  PCCM consulted for assistance   [name] with PMH of HTN, Seizures, CVA, PVD s/p AKA, Neuromuscular disorder (not defined), CKD and DM with recent I&D of R foot ulceration per Dr. [name] (d/c'd on 8/15).  Readmitted 8/30-9/8 per [facility] for HCAP and continued wound care for R foot.  MRI 9/1 demonstrated diffuse soft tissue infection & osteo.  Discharged home on oral cipro & IV vanco.  Returned to [facility] ER on 9/9 with profound hypoglycemia - pt was on both lantus and 70/30 at time of discharge.   9/12 - Acute decompensation     SIGNIFICANT EVENTS / STUDIES:  8/15 - discharge after I&D per Dr. [name] for R lateral foot ulcer 8/30 - 9/8 Admit with HCAP, R toe wound ......................................................................................................... 9/09 - Admit with hypoglycemia.  9/12 - Acute decompensation with n/v and subsequent change in mental status.  Pt lethargic, denied pain.  9/12 CT chest - BLL consolidation with effusions  LINES / TUBES: OETT 9/12>>>9/13 PICC 8/12 >>>  CULTURES: UC 9/9>>>ng BCx2 9/9>>>ng  ANTIBIOTICS: Meropenem 9/9>>> Vanco 9/9>>>   SUBJECTIVE: denies CP, dyspnea Afebrile Self extubated 9/13 Diuresed 6L with lasix  VITAL SIGNS: Temp:  [98 F (36.7 C)-98.9 F (37.2 C)] 98.9 F (37.2 C) (09/14 0752) Pulse Rate:  [92-131] 109 (09/14 0800) Resp:  [12-27] 18 (09/14 0800) BP: (107-170)/(60-82) 144/69 mmHg (09/14 0800) SpO2:  [95 %-100 %] 100 % (09/14  0800) FiO2 (%):  [40 %] 40 % (09/13 1918) Weight:  [92.1 kg (203 lb 0.7 oz)] 92.1 kg (203 lb 0.7 oz) (09/14 0500)  HEMODYNAMICS:    VENTILATOR SETTINGS: Vent Mode:  [-] PRVC FiO2 (%):  [40 %] 40 % Set Rate:  [20 bmp] 20 bmp Vt Set:  [580 mL] 580 mL PEEP:  [5 cmH20] 5 cmH20 Pressure Support:  [5 cmH20-15 cmH20] 15 cmH20 Plateau Pressure:  [17 cmH20] 17 cmH20  INTAKE / OUTPUT: Intake/Output     09/13 0701 - 09/14 0700 09/14 0701 - 09/15 0700   P.O.     I.V. (mL/kg) 480 (5.2) 20 (0.2)   NG/GT 272.5    IV Piggyback 100    Total Intake(mL/kg) 852.5 (9.3) 20 (0.2)   Urine (mL/kg/hr) 6200 (2.8) 250 (1.3)   Total Output 6200 250   Net -5347.5 -230          PHYSICAL EXAMINATION: General:  Chronically ill adult male intubated, no acute distress  Neuro:  Lethargic, aroused, minimal interaction  HEENT:  Mm pink/moist, no jvd Cardiovascular:  s1s2 rrr, distant tones Lungs:  resp's even/non-labored, lungs bilaterally decreased Abdomen:  Soft, distended, decreased bs  Musculoskeletal:  No acute deformities, R BKA Skin:  Right medial foot 0.5 cm ulcer  LABS:  CBC Recent Labs     11/29/12  1740  11/30/12  0353  12/01/12  0500  WBC  13.2*  10.9*  8.8  HGB  9.2*  9.2*  10.4*  HCT  29.3*  29.2*  32.1*  PLT  431*  383  315  Coag's No results found for this basename: APTT, INR,  in the last 72 hours BMET Recent Labs     11/29/12  1740  11/30/12  0353  12/01/12  0500  NA  133*  136  139  K  5.1  4.3  3.6  CL  106  108  108  CO2  19  18*  20  BUN  19  18  17   CREATININE  1.36*  1.40*  1.38*  GLUCOSE  153*  96  111*   Electrolytes Recent Labs     11/29/12  1740  11/30/12  0353  12/01/12  0500  CALCIUM  8.0*  8.4  8.3*   Sepsis Markers No results found for this basename: LACTICACIDVEN, PROCALCITON, O2SATVEN,  in the last 72 hours ABG Recent Labs     11/30/12  0353  11/30/12  1048  12/01/12  0255  PHART  7.331*  7.371  7.398  PCO2ART  36.5  30.9*  30.7*   PO2ART  84.6  84.0  78.1*   Liver Enzymes Recent Labs     11/29/12  0500  11/29/12  1740  11/30/12  0353  AST   --   19   --   ALT   --   13   --   ALKPHOS   --   111   --   BILITOT   --   0.1*   --   ALBUMIN  1.7*  1.7*  1.7*   Cardiac Enzymes Recent Labs     11/29/12  1740  11/29/12  2331  11/30/12  0353  TROPONINI  <0.30  <0.30  <0.30  PROBNP  7199.0*   --    --    Glucose Recent Labs     11/30/12  1105  11/30/12  1524  11/30/12  1918  12/01/12  0005  12/01/12  0338  12/01/12  0728  GLUCAP  84  108*  130*  107*  107*  106*    Imaging Ct Abdomen Pelvis Wo Contrast  11/29/2012   CLINICAL DATA:  Abdominal distention, nausea, vomiting. Altered mental status.  EXAM: CT CHEST, ABDOMEN AND PELVIS WITHOUT CONTRAST  TECHNIQUE: Multidetector CT imaging of the chest, abdomen and pelvis was performed following the standard protocol without IV contrast.  COMPARISON:  None.  FINDINGS: CT CHEST FINDINGS  There are large bilateral pleural effusions. Compressive atelectasis in the lower lobes. Airspace opacities in both upper lobes, right greater than left. This concerning for pneumonia. Heart is mildly enlarged. Small pericardial effusion. Aorta is normal caliber. Small scattered mediastinal lymph nodes, none pathologically enlarged. No axillary adenopathy.  Tracheostomy tube tip is in the lower trachea above the carina. NG tube enters the stomach.  No acute bony abnormality.  Multiple old right rib fractures.  CT ABDOMEN AND PELVIS FINDINGS  As mentioned, NG tube enters the stomach. Large stool burden throughout the colon, particularly the right colon and transverse colon. Small bowel is decompressed.  Liver, spleen, pancreas, adrenals and kidneys have a grossly unremarkable unenhanced appearance. Aorta and iliac vessels are normal caliber. Small left inguinal hernia containing fat. Urinary bladder is grossly unremarkable. Prostate mildly prominent. No free fluid, free air or adenopathy.   Edema/stranding throughout the subcutaneous soft tissues of the abdomen, pelvis and upper aspect of the lower extremities. No acute bony abnormality. Apparent old healed trauma to the right hemipelvis.  IMPRESSION: CT CHEST IMPRESSION  Large bilateral pleural effusions with compressive atelectasis in both lungs.  Airspace disease in the upper lobes, right greater the left concerning for pneumonia.  Mild cardiomegaly. Small pericardial effusion.  CT ABDOMEN AND PELVIS IMPRESSION  Large stool burden in the right colon and transverse colon.  No definite acute process in the abdomen or pelvis.   Electronically Signed   By: Rolm Baptise M.D.   On: 11/29/2012 22:17   Dg Tibia/fibula Right  11/29/2012   CLINICAL DATA:  Right lower leg swelling. [name]  EXAM: RIGHT TIBIA AND FIBULA - 2 VIEW  COMPARISON:  06/30/2009.  FINDINGS: Extensive lower extremity soft tissue swelling, nonspecific. There is new fragmentation of the medial malleolus, with indistinct margins. New, but chronic appearing, periosteal changes to the lateral cortex lower tibia. Extensive demineralization of the midfoot. No acute fracture. Extensive vascular calcification.  IMPRESSION: New (since 2011) erosion/ fragmentation of the medial malleolus is concerning for osseous infection given ipsilateral foot osteomyelitis/abscess.   Electronically Signed   By: Jorje Guild   On: 11/29/2012 22:59   Ct Head Wo Contrast  11/29/2012   CLINICAL DATA:  Altered mental status.  EXAM: CT HEAD WITHOUT CONTRAST  TECHNIQUE: Contiguous axial images were obtained from the base of the skull through the vertex without intravenous contrast.  COMPARISON:  MRI 09/30/2010  FINDINGS: Chronic microvascular changes throughout the deep white matter. Old left occipital infarct. No acute intracranial abnormality. Specifically, no hemorrhage, hydrocephalus, mass lesion, acute infarction, or significant intracranial injury. No acute calvarial abnormality. Visualized paranasal sinuses  and mastoids clear. Orbital soft tissues unremarkable.  IMPRESSION: Old left occipital infarct. Chronic small vessel disease changes. No acute findings or change.   Electronically Signed   By: Rolm Baptise M.D.   On: 11/29/2012 22:10   Ct Chest Wo Contrast  11/29/2012   CLINICAL DATA:  Abdominal distention, nausea, vomiting. Altered mental status.  EXAM: CT CHEST, ABDOMEN AND PELVIS WITHOUT CONTRAST  TECHNIQUE: Multidetector CT imaging of the chest, abdomen and pelvis was performed following the standard protocol without IV contrast.  COMPARISON:  None.  FINDINGS: CT CHEST FINDINGS  There are large bilateral pleural effusions. Compressive atelectasis in the lower lobes. Airspace opacities in both upper lobes, right greater than left. This concerning for pneumonia. Heart is mildly enlarged. Small pericardial effusion. Aorta is normal caliber. Small scattered mediastinal lymph nodes, none pathologically enlarged. No axillary adenopathy.  Tracheostomy tube tip is in the lower trachea above the carina. NG tube enters the stomach.  No acute bony abnormality.  Multiple old right rib fractures.  CT ABDOMEN AND PELVIS FINDINGS  As mentioned, NG tube enters the stomach. Large stool burden throughout the colon, particularly the right colon and transverse colon. Small bowel is decompressed.  Liver, spleen, pancreas, adrenals and kidneys have a grossly unremarkable unenhanced appearance. Aorta and iliac vessels are normal caliber. Small left inguinal hernia containing fat. Urinary bladder is grossly unremarkable. Prostate mildly prominent. No free fluid, free air or adenopathy.  Edema/stranding throughout the subcutaneous soft tissues of the abdomen, pelvis and upper aspect of the lower extremities. No acute bony abnormality. Apparent old healed trauma to the right hemipelvis.  IMPRESSION: CT CHEST IMPRESSION  Large bilateral pleural effusions with compressive atelectasis in both lungs. Airspace disease in the upper lobes,  right greater the left concerning for pneumonia.  Mild cardiomegaly. Small pericardial effusion.  CT ABDOMEN AND PELVIS IMPRESSION  Large stool burden in the right colon and transverse colon.  No definite acute process in the abdomen or pelvis.   Electronically Signed   By:  Rolm Baptise M.D.   On: 11/29/2012 22:17   Dg Chest Port 1 View  12/01/2012   CLINICAL DATA:  67 year old male with confusion, pneumonia.  EXAM: PORTABLE CHEST - 1 VIEW  COMPARISON:  11/30/2012 and earlier.  FINDINGS: Portable AP semi upright view at 0610 hr. The patient is extubated. Enteric tube removed. Stable right PICC line.  Stable lung volumes. Continued veiling bilateral pulmonary opacity. Continued dense retrocardiac opacity. No pneumothorax. Pulmonary vascularity year remains indistinct.  IMPRESSION: 1. Extubated. Enteric tube removed.  2. Stable lung volumes with widespread veiling opacity suggesting pleural effusions right greater than left.  3.  Continued lower lobe collapse/consolidation.   Electronically Signed   By: Lars Pinks M.D.   On: 12/01/2012 07:22   Dg Chest Port 1 View  11/30/2012   ADDENDUM REPORT: 11/30/2012 01:27  ADDENDUM: Right PICC line is in place. The tip is in the upper to mid right atrium, approximately 3 cm deep to the cavoatrial junction.   Electronically Signed   By: Rolm Baptise M.D.   On: 11/30/2012 01:27   11/30/2012   CLINICAL DATA:  Line placement.  EXAM: PORTABLE CHEST - 1 VIEW  COMPARISON:  11/29/2012  FINDINGS: Endotracheal tube is 3 cm above the carina. Severe diffuse bilateral airspace disease and layering effusions. No real change since prior study. Heart is normal size. No acute bony abnormality.  IMPRESSION: Endotracheal tube and NG tube in stable position.  Stable diffuse bilateral airspace disease and bilateral effusions.  Electronically Signed: By: Rolm Baptise M.D. On: 11/30/2012 01:06   Dg Chest Port 1 View  11/29/2012   *RADIOLOGY REPORT*  Clinical Data: Endotracheal tube placement.   PORTABLE CHEST - 1 VIEW  Comparison: Same date.  Findings: Endotracheal tube is seen projected over tracheal air shadow with distal tip 2 cm above the carina.  Stable cardiomediastinal silhouette.  Significantly increased bilateral basilar opacities is noted consistent with worsening edema or pneumonia.  Associated pleural effusions are noted.  Right-sided PICC line is noted with tip in expected position of the SVC. Nasogastric tube passes through esophagus into stomach.  IMPRESSION: Endotracheal tube in grossly good position.  Worsening bilateral basilar opacities consistent with edema or possibly pneumonia.   Original Report Authenticated By: Marijo Conception.,  M.D.   Dg Abd Portable 1v  11/29/2012   CLINICAL DATA:  67 year old male NG tube placement.  EXAM: PORTABLE ABDOMEN - 1 VIEW  COMPARISON:  None.  FINDINGS: Portable AP supine view at 1904 hrs. Enteric tube courses to the left upper quadrant, tip at the level of the gastric body and side hole to level of the gastric fundus. Visualized bowel gas pattern is non obstructed.  IMPRESSION: NG tube tip at the level of the gastric body.   Electronically Signed   By: Lars Pinks M.D.   On: 11/29/2012 19:13    ASSESSMENT / PLAN:  PULMONARY A: Acute Respiratory Failure - unclear etiology, wheezing with upper airway predominance prior to intubation concerning for neuro source.   ? Aspiration Event - with n/v prior to intubation  Pleural effusions P:   - D-Dimer pos, await duplex -doubt PE -BD's PRN  CARDIOVASCULAR A:   - with risk factors: DM, PVD, HTN, CVA hx PVD HTN P:  -await  ECHO -high BNP & edema -asa -hold lipitor, norvasc -reduce dose lopressor while in ICU  RENAL A:   CKD - baseline cr ~1.3 P:   -pharmacy dosing of medications -lasix 40 q 12h as  BP permits  GASTROINTESTINAL A:   Nausea / Vomiting - abrupt onset 9/9 with associated mental status change P:   -PRN zofran -resume PO  HEMATOLOGIC A:   Anemia - likely in  setting of chronic renal disease P:  -follow CBC, trend h/h -consider tx if hgb <7%, active bleed or MI  INFECTIOUS A:   R Foot Osteomyelitis - recent I&D with d/c on IV vanco   Rule out Aspiration PNA - RUL ? Infiltrate with noted air bronchograms  xray of RLE  (SEE MRI 11/18/12) - new fragmentation of the medial malleolus, with indistinct margins. New, but chronic appearing, periosteal changes to the  lateral cortex lower tibia -c/w Charcot joint per Dr [name] -OK to weight bear  P:   -abx as above -follow cultures -wound care as ordered, compression ok -per dr duda, IV Abx not required, PO doxy ok on discharge   ENDOCRINE A:   DM / Hyperglycemia    P:   -SSI -hold lantus  NEUROLOGIC A:   Acute AMS - noted 9/12 afternoon with n/v, -CT head -old CVA Hx of Seizures - no noted seizure activity Neuromuscular Disorder - not defined P:  PT consult, uses walker   Wife updated    I have personally obtained a history, examined the patient, evaluated laboratory and imaging results, formulated the assessment and plan and placed orders.  CRITICAL CARE: The patient is critically ill with multiple organ systems failure and requires high complexity decision making for assessment and support, frequent evaluation and titration of therapies, application of advanced monitoring technologies and extensive interpretation of multiple databases. Critical Care Time devoted to patient care services described in this note is 31 minutes.   Kara Mead MD. [place] Flood. [facility] Pager [phone] If no response call [phone]   12/01/2012, 9:03 AM

## 2012-12-01 NOTE — Progress Notes (Signed)
Patient self extubated, SPO2 in the upper 90's patient talking- speech clear,no signs of distress. Placed on 4 liters nasal cannula per RT, MD made aware and spouse notified.   Desmond Dike RN

## 2012-12-01 NOTE — Progress Notes (Signed)
  Echocardiogram 2D Echocardiogram has been performed.  Mauricio Po 12/01/2012, 12:22 PM

## 2012-12-01 NOTE — Progress Notes (Signed)
VASCULAR LAB PRELIMINARY  PRELIMINARY  PRELIMINARY  PRELIMINARY  Bilateral lower extremity venous Dopplers completed.    Preliminary report:  There is no DVT or SVT noted in the bilateral lower extremities.  Ger Nicks, RVT 12/01/2012, 12:32 PM

## 2012-12-01 NOTE — Progress Notes (Signed)
UR Completed.  [name] [id number] 12/01/2012

## 2012-12-02 DIAGNOSIS — M14679 Charcot's joint, unspecified ankle and foot: Secondary | ICD-10-CM | POA: Diagnosis present

## 2012-12-02 DIAGNOSIS — N184 Chronic kidney disease, stage 4 (severe): Secondary | ICD-10-CM | POA: Diagnosis present

## 2012-12-02 LAB — BASIC METABOLIC PANEL
BUN: 20 mg/dL (ref 6–23)
CO2: 21 mEq/L (ref 19–32)
Calcium: 8.2 mg/dL — ABNORMAL LOW (ref 8.4–10.5)
Chloride: 109 mEq/L (ref 96–112)
Creatinine, Ser: 1.53 mg/dL — ABNORMAL HIGH (ref 0.50–1.35)
GFR calc Af Amer: 53 mL/min — ABNORMAL LOW (ref >90)

## 2012-12-02 LAB — CULTURE, BLOOD (ROUTINE X 2): Culture: NO GROWTH

## 2012-12-02 LAB — GLUCOSE, CAPILLARY: Glucose-Capillary: 229 mg/dL — ABNORMAL HIGH (ref 70–99)

## 2012-12-02 LAB — CBC
HCT: 31 % — ABNORMAL LOW (ref 39.0–52.0)
MCH: 28.2 pg (ref 26.0–34.0)
MCV: 86.6 fL (ref 78.0–100.0)
RDW: 16.2 % — ABNORMAL HIGH (ref 11.5–15.5)
WBC: 10.9 10*3/uL — ABNORMAL HIGH (ref 4.0–10.5)

## 2012-12-02 MED ORDER — PHENYTOIN SODIUM EXTENDED 100 MG PO CAPS
200.0000 mg | ORAL_CAPSULE | Freq: Two times a day (BID) | ORAL | Status: DC
  Administered 2012-12-02: 200 mg via ORAL
  Filled 2012-12-02 (×4): qty 2

## 2012-12-02 MED ORDER — INSULIN ASPART 100 UNIT/ML ~~LOC~~ SOLN
0.0000 [IU] | Freq: Three times a day (TID) | SUBCUTANEOUS | Status: DC
  Administered 2012-12-02 (×2): 5 [IU] via SUBCUTANEOUS
  Administered 2012-12-03: 2 [IU] via SUBCUTANEOUS
  Administered 2012-12-03: 3 [IU] via SUBCUTANEOUS
  Administered 2012-12-03 – 2012-12-06 (×5): 2 [IU] via SUBCUTANEOUS

## 2012-12-02 MED ORDER — SODIUM CHLORIDE 0.9 % IV SOLN: 1000.0000 mL | INTRAVENOUS | Status: DC

## 2012-12-02 MED ORDER — SODIUM CHLORIDE 0.9 % IV SOLN
1000.0000 mL | INTRAVENOUS | Status: DC
  Administered 2012-12-02: 1000 mL via INTRAVENOUS
  Administered 2012-12-04: 500 mL via INTRAVENOUS

## 2012-12-02 MED ORDER — METOPROLOL TARTRATE 25 MG PO TABS
25.0000 mg | ORAL_TABLET | Freq: Two times a day (BID) | ORAL | Status: DC
  Administered 2012-12-02 – 2012-12-03 (×3): 25 mg via ORAL
  Filled 2012-12-02 (×4): qty 1

## 2012-12-02 MED ORDER — SODIUM CHLORIDE 0.9 % IV SOLN
1000.0000 mL | INTRAVENOUS | Status: DC
  Administered 2012-12-02: 1000 mL via INTRAVENOUS

## 2012-12-02 MED ORDER — INSULIN GLARGINE 100 UNIT/ML ~~LOC~~ SOLN
16.0000 [IU] | Freq: Every day | SUBCUTANEOUS | Status: DC
  Filled 2012-12-02 (×2): qty 0.16

## 2012-12-02 MED ORDER — INSULIN GLARGINE 100 UNIT/ML ~~LOC~~ SOLN
24.0000 [IU] | Freq: Every day | SUBCUTANEOUS | Status: DC
  Administered 2012-12-02 – 2012-12-03 (×2): 24 [IU] via SUBCUTANEOUS
  Filled 2012-12-02 (×3): qty 0.24

## 2012-12-02 MED ORDER — PHENYTOIN 50 MG PO CHEW: 200.0000 mg | CHEWABLE_TABLET | Freq: Two times a day (BID) | ORAL | Status: DC

## 2012-12-02 NOTE — Progress Notes (Signed)
NUTRITION FOLLOW UP  Intervention:   Add bedtime snack daily to ensure adequate intake and to help maintain optimal glucose control.  Nutrition Dx:   Inadequate oral intake related to inability to eat as evidenced by NPO status. Resolved.  Goal:   Intake to meet >90% of estimated nutrition needs. Met.  Monitor:   PO intake, labs, weight trend.  Assessment:   Patient self-extubated 9/13. Diet has been advanced to CHO-modified medium. Patient is consuming 100% of meals without difficulty.  Height: Ht Readings from Last 1 Encounters:  11/28/12 5' 10.08" (1.78 m)    Weight Status:   Wt Readings from Last 1 Encounters:  12/01/12 203 lb 0.7 oz (92.1 kg)  11/28/12  202 lb 1.5 oz (91.67 kg)   Re-estimated needs:  Kcal: 2000-2200 Protein: 100-120 gm Fluid: 2.1-2.3 L  Skin: L BKA   Diet Order: Carb Control   Intake/Output Summary (Last 24 hours) at 12/02/12 1228 Last data filed at 12/02/12 1100  Gross per 24 hour  Intake    970 ml  Output   1050 ml  Net    -80 ml    Last BM: 9/14   Labs:   Recent Labs Lab 11/30/12 0353 12/01/12 0500 12/02/12 0630  NA 136 139 140  K 4.3 3.6 3.8  CL 108 108 109  CO2 18* 20 21  BUN 18 17 20   CREATININE 1.40* 1.38* 1.53*  CALCIUM 8.4 8.3* 8.2*  GLUCOSE 96 111* 204*    CBG (last 3)   Recent Labs  12/02/12 0315 12/02/12 0750 12/02/12 1157  GLUCAP 201* 152* 229*    Scheduled Meds: . aspirin  325 mg Oral Daily  . furosemide  40 mg Intravenous Q12H  . heparin  5,000 Units Subcutaneous Q8H  . insulin aspart  0-15 Units Subcutaneous TID WC  . insulin glargine  16 Units Subcutaneous QHS  . latanoprost  1 drop Both Eyes QHS  . meropenem (MERREM) IV  1 g Intravenous Q12H  . metoprolol  25 mg Oral BID  . potassium chloride  20 mEq Oral BID  . silver sulfADIAZINE   Topical Daily  . tamsulosin  0.4 mg Oral QPC supper  . timolol  1 drop Both Eyes Daily  . vancomycin  1,000 mg Intravenous Q24H    Continuous Infusions: .  sodium chloride 1,000 mL (12/02/12 0919)    Molli Barrows, RD, LDN, [place] Pager [phone] After Hours Pager [phone]

## 2012-12-02 NOTE — Progress Notes (Signed)
[facility] Progress Note  TEAM 1 - Stepdown ICU Team   [name]. [id number] DOB: May 13, 1945 DOA: 11/26/2012 PCP: [name], MD  Brief narrative: 67 y/o admitted 8/30-9/8 per [facility] for HCAP and continued wound care for R foot. MRI 9/1 demonstrated diffuse soft tissue infection & osteo. Discharged home on oral cipro & IV vanco after Ortho evaluation. Returned to [facility] ER on 9/9 with profound hypoglycemia (pt was on both lantus and 70/30 at time of discharge as [name] [facility]EN as was listed on his med rec). 9/12 - Acute respiratory decompensation requiring intubation.   Assessment/Plan:    Acute respiratory failure with hypoxia/Bilateral pneumonia -improved and stable on RA -cont empiric anbx's -?of acute aspiration in setting of encephalopathy at admit    DM2 (diabetes mellitus, type 2) with hypoglycemia -CBG's are trending up -resume Lantus - pt told MD's at previous admit he took both Lantus and 70/30 for adequate glycemic control -cont SSI -hypoglycemia pre admit suspected due to poor intake in setting of aggressive glycemic control regimen    Encephalopathy acute -resolved   Seizure disorder -no evidence of Sz activity - cont current med tx    CKD (chronic kidney disease) stage 3, GFR 30-59 ml/min -Scr mostly stable (sl trend up) and excellent UOP    Right foot ulcer/Charcot's joint of foot -Followed by [name] as OP    Hypertension -BP controlled-Lopressor resumed 9/14   Tachycardia -resting HR ~100 with rates in 120's w/activity -may be somewhat volume depleted so will begin gentle IV hydration -DC Lasix for now   Anemia -Hgb stable   DVT prophylaxis: Subcutaneous heparin Code Status: Full Family Communication: Patient Disposition Plan/Expected LOS: Transfer to telemetry  Consultants: Ortho PCCM  Procedures: 2-D echocardiogram - pending Lower extremity venous duplex -  pending  CULTURES:  UC 9/9>>>ng  BCx2 9/9>>>ng  Antibiotics: Meropenem 9/9>>>  Vanco 9/9>>>  HPI/Subjective: Patient alert and working with physical therapy with ambulation. Has left lower extremity prosthesis in place. No complaints of chest pain or shortness of breath.  Objective: Blood pressure 133/58, pulse 88, temperature 98.4 F (36.9 C), temperature source Oral, resp. rate 18, height 5' 10.08" (1.78 m), weight 92.1 kg (203 lb 0.7 oz), SpO2 100.00%.  Intake/Output Summary (Last 24 hours) at 12/02/12 1439 Last data filed at 12/02/12 1312  Gross per 24 hour  Intake    890 ml  Output   1150 ml  Net   -260 ml    Exam: General: No acute respiratory distress Lungs: Clear to auscultation bilaterally without wheezes or crackles, RA Cardiovascular: Regular rate and rhythm without murmur gallop or rub normal S1 and S2, no peripheral edema or JVD Abdomen: Nontender, nondistended, soft, bowel sounds positive, no rebound, no ascites, no appreciable mass Musculoskeletal: No significant cyanosis, clubbing of bilateral lower extremities-prior left lower extremity amputation with prosthesis in place Neurological: Alert and oriented x 3, moves all extremities x 4 without focal neurological deficits, CN 2-12 intact  Scheduled Meds:  Scheduled Meds: . aspirin  325 mg Oral Daily  . furosemide  40 mg Intravenous Q12H  . heparin  5,000 Units Subcutaneous Q8H  . insulin aspart  0-15 Units Subcutaneous TID WC  . insulin glargine  16 Units Subcutaneous QHS  . latanoprost  1 drop Both Eyes QHS  . meropenem (MERREM) IV  1 g Intravenous Q12H  . metoprolol  25 mg Oral BID  . potassium chloride  20 mEq Oral BID  . silver sulfADIAZINE   Topical Daily  . tamsulosin  0.4 mg Oral QPC supper  . timolol  1 drop Both Eyes Daily  . vancomycin  1,000 mg Intravenous Q24H    Data Reviewed: Basic Metabolic Panel:  Recent Labs Lab 11/29/12 0500 11/29/12 1740 11/30/12 0353 12/01/12 0500  12/02/12 0630  NA 135 133* 136 139 140  K 4.8 5.1 4.3 3.6 3.8  CL 107 106 108 108 109  CO2 19 19 18* 20 21  GLUCOSE 193* 153* 96 111* 204*  BUN 17 19 18 17 20   CREATININE 1.36* 1.36* 1.40* 1.38* 1.53*  CALCIUM 8.2* 8.0* 8.4 8.3* 8.2*   Liver Function Tests:  Recent Labs Lab 11/26/12 1215 11/29/12 0500 11/29/12 1740 11/30/12 0353 12/02/12 0630  AST 23  --  19  --   --   ALT 17  --  13  --   --   ALKPHOS 113  --  111  --   --   BILITOT 0.2*  --  0.1*  --   --   PROT 6.5  --  6.3  --   --   ALBUMIN 1.8* 1.7* 1.7* 1.7* 1.6*   CBC:  Recent Labs Lab 11/26/12 1215  11/29/12 0500 11/29/12 1740 11/30/12 0353 12/01/12 0500 12/02/12 0630  WBC 15.8*  < > 13.6* 13.2* 10.9* 8.8 10.9*  NEUTROABS 13.3*  --   --  10.8*  --   --   --   HGB 10.0*  < > 9.2* 9.2* 9.2* 10.4* 10.1*  HCT 30.7*  < > 27.9* 29.3* 29.2* 32.1* 31.0*  MCV 87.7  < > 88.3 89.9 89.3 87.2 86.6  PLT 445*  < > 439* 431* 383 315 420*  < > = values in this interval not displayed. Cardiac Enzymes:  Recent Labs Lab 11/29/12 1740 11/29/12 2331 11/30/12 0353  TROPONINI <0.30 <0.30 <0.30   BNP (last 3 results)  Recent Labs  11/17/12 0016 11/29/12 1740  PROBNP 4406.0* 7199.0*   CBG:  Recent Labs Lab 12/01/12 2343 12/02/12 0315 12/02/12 0750 12/02/12 1134 12/02/12 1157  GLUCAP 247* 201* 152* 218* 229*    Recent Results (from the past 240 hour(s))  CULTURE, BLOOD (ROUTINE X 2)     Status: None   Collection Time    11/26/12 12:15 PM      Result Value Range Status   Specimen Description BLOOD RIGHT PICC LINE   Final   Special Requests BOTTLES DRAWN AEROBIC ONLY 10CC   Final   Culture  Setup Time     Final   Value: 11/26/2012 16:23     Performed at [facility]   Culture     Final   Value: NO GROWTH 5 DAYS     Performed at [facility]   Report Status 12/02/2012 FINAL   Final  CULTURE, BLOOD (ROUTINE X 2)     Status: None   Collection Time    11/26/12 12:20 PM      Result  Value Range Status   Specimen Description BLOOD LEFT HAND   Final   Special Requests BOTTLES DRAWN AEROBIC ONLY 5CC   Final   Culture  Setup Time     Final   Value: 11/26/2012 16:23     Performed at [facility]   Culture     Final   Value: NO GROWTH 5 DAYS     Performed at [facility]   Report Status  12/02/2012 FINAL   Final  URINE CULTURE     Status: None   Collection Time    11/26/12  3:19 PM      Result Value Range Status   Specimen Description URINE, RANDOM   Final   Special Requests NONE   Final   Culture  Setup Time     Final   Value: 11/26/2012 16:16     Performed at [facility]     Final   Value: 3,000 COLONIES/ML     Performed at [facility]   Culture     Final   Value: INSIGNIFICANT GROWTH     Performed at [facility]   Report Status 11/27/2012 FINAL   Final  MRSA PCR SCREENING     Status: None   Collection Time    11/29/12  5:22 PM      Result Value Range Status   MRSA by PCR NEGATIVE  NEGATIVE Final   Comment:            The GeneXpert MRSA Assay (FDA     approved for NASAL specimens     only), is one component of a     comprehensive MRSA colonization     surveillance program. It is not     intended to diagnose MRSA     infection nor to guide or     monitor treatment for     MRSA infections.     Studies:  Recent x-ray studies have been reviewed in detail by the Attending Physician  Erin Hearing, ANP [facility] Office  [phone] Pager [phone]  **If unable to reach the above provider after paging please contact the [place] @ [phone]  On-Call/Text Page:      [email or web address]      password [email or web address]  If 7PM-7AM, please contact night-coverage [email or web address] Password [email or web address] 12/02/2012, 2:39 PM   LOS: 6 days   I have personally examined this patient and reviewed the entire database. I have reviewed the above note, made any necessary editorial changes, and agree with its  content.  Cherene Altes, MD [facility]

## 2012-12-02 NOTE — Progress Notes (Signed)
[facility]  Patient Status:   Mr. Dube has been active with [facility] up to this readmission  [facility] is providing the following services: [facility] is providing [place] RN, Home infusion pharmacy services for home IV ABX.  [facility] hospital team will follow and support transition to home when deemed appropriate.  If patient discharges after hours, please call [phone].   Larry Sierras 12/02/2012, 11:25 AM

## 2012-12-02 NOTE — Progress Notes (Signed)
Gave report to Lamoni, RN on 3W. All questions answered.

## 2012-12-02 NOTE — Clinical Documentation Improvement (Signed)
THIS DOCUMENT IS NOT A PERMANENT PART OF THE MEDICAL RECORD  Please update your documentation with the medical record to reflect your response to this query. If you need help knowing how to do this please call [phone].  12/02/12  Dear Dr. [facility]  In a better effort to capture your patient's severity of illness, reflect appropriate length of stay and utilization of resources, a review of the patient medical record has revealed the following indicators.    Based on your clinical judgment, please clarify and document in a progress note and/or discharge summary the clinical condition associated with the following supporting information:  In responding to this query please exercise your independent judgment.  The fact that a query is asked, does not imply that any particular answer is desired or expected.  Possible Clinical Conditions?   Aspiration Pneumonia (POA?) Gram Negative Pneumonia (POA?) Pneumonia (CAP, HAP) (POA?) Other Condition_ Cannot Clinically Determine   Supporting Information:  Signs & Symptoms:(As per notes)" Rule out Aspiration PNA - RUL ?"  "? &Aspiration Event - with n/v prior to intubation" , "Possible etiologies include aspiration since he had an episode of nausea vomiting this afternoon prior to this deterioration," Pt intubated for assisted ventilation.on 11-29-12  Diagnostics:,  Lab: (As per notes)11-26-12 WBC:15.8  Radiology: CXR 11-27-22 IMPRESSION: Patchy right upper lobe and bilateral lower lobe opacities. Slight improvement in aeration in the lung bases.  CXR 11-29-12 "Slight increase in degree of bilateral infiltrates when compared with the prior exam. CT CHEST 11-29-12 "Large bilateral pleural effusions with compressive atelectasis in both lungs. Airspace disease in the upper lobes, right greater the left concerning for pneumonia."    You may use possible, probable, or suspect with inpatient documentation. possible, probable, suspected  diagnoses MUST be documented at the time of discharge  Reviewed:  no additional documentation provided  Thank You,  Alessandra Grout RN, BSN, [place] Documentation Specialist: [phone] Cell: [place] never saw this patient as far as I can tell   Merton Border, MD ; [facility] service Mobile [phone].  After 5:30 PM or weekends, call [phone]

## 2012-12-02 NOTE — Progress Notes (Signed)
Physical Therapy Treatment Patient Details Name: Tyjohn Masaki. MRN: [id number] DOB: [birth year] Today's Date: 12/02/2012 Time: [id number] PT Time Calculation (min): 33 min  PT Assessment / Plan / Recommendation  History of Present Illness Recently discharged from the hospital for PNA. He was noted to be profoundly hypoglycemic on the morning of admit with bs as low as the 20's. Acute respiratory failure with ETT 9/12-9/13 self extubated. Chronic left foot wound followed by Dr.Duda currently WBAT   PT Comments   Pt with excellent progression able to ambulate today and excited that Dr.Duda increased weight bearing status. Pt encouraged to keep checking would for progressive healing and to continue HEP and ambulation, RN aware. Will follow.   Follow Up Recommendations        Does the patient have the potential to tolerate intense rehabilitation     Barriers to Discharge        Equipment Recommendations       Recommendations for Other Services    Frequency     Progress towards PT Goals Progress towards PT goals: Progressing toward goals  Plan Current plan remains appropriate    Precautions / Restrictions Precautions Precautions: Fall Precaution Comments: right foot wound Other Brace/Splint: L LE Prosthetic Restrictions RLE Weight Bearing: Weight bearing as tolerated LLE Weight Bearing: Weight bearing as tolerated   Pertinent Vitals/Pain HR 100-130 with activity 100% RA No pain    Mobility  Bed Mobility Bed Mobility: Sit to Supine Sit to Supine: 5: Supervision Details for Bed Mobility Assistance: supervision for lines Transfers Transfers: Sit to Stand;Stand to Sit;Stand Pivot Transfers Sit to Stand: 3: Mod assist;1: +2 Total assist;From bed;From chair/3-in-1;With armrests Sit to Stand: Patient Percentage: 60% Stand to Sit: 4: Min assist Stand Pivot Transfers: 1: +2 Total assist Stand Pivot Transfers: Patient Percentage: 70% Details for Transfer Assistance: standing  from low chair +2 with assist to lock in LLE prosthesis and achieve anterior translation with use of RW to pivot to bed. Standing from elevated bed mod assist. cueing for hand placement Ambulation/Gait Ambulation/Gait Assistance: 4: Min assist Ambulation Distance (Feet): 150 Feet Assistive device: Rolling walker Ambulation/Gait Assistance Details: cueing for posture, position in RW and safety as pt with tendency to place RW too far anterior Gait Pattern: Step-through pattern;Decreased stride length;Trunk flexed Gait velocity: decreased    Exercises General Exercises - Lower Extremity Long Arc Quad: AROM;Right;20 reps;Seated Hip Flexion/Marching: AROM;Both;20 reps;Seated   PT Diagnosis:    PT Problem List:   PT Treatment Interventions:     PT Goals (current goals can now be found in the care plan section)    Visit Information  Last PT Received On: 12/02/12 Assistance Needed: +2 (safety with transfers) History of Present Illness: Recently discharged from the hospital for PNA. He was noted to be profoundly hypoglycemic on the morning of admit with bs as low as the 20's. Acute respiratory failure with ETT 9/12-9/13 self extubated. Chronic left foot wound followed by Dr.Duda currently WBAT    Subjective Data      Cognition  Cognition Arousal/Alertness: Awake/alert Behavior During Therapy: WFL for tasks assessed/performed Overall Cognitive Status: Within Functional Limits for tasks assessed    Balance     End of Session PT - End of Session Equipment Utilized During Treatment: Gait belt Activity Tolerance: Patient tolerated treatment well Patient left: in bed;with nursing/sitter in room;with call bell/phone within reach Nurse Communication: Mobility status   GP     Melford Aase 12/02/2012, 9:20 AM Lanetta Inch Brittane Grudzinski,  PT [phone]

## 2012-12-03 ENCOUNTER — Encounter (HOSPITAL_COMMUNITY): Payer: Self-pay | Admitting: Physician Assistant

## 2012-12-03 DIAGNOSIS — I5021 Acute systolic (congestive) heart failure: Secondary | ICD-10-CM | POA: Clinically undetermined

## 2012-12-03 DIAGNOSIS — A419 Sepsis, unspecified organism: Secondary | ICD-10-CM

## 2012-12-03 DIAGNOSIS — I5032 Chronic diastolic (congestive) heart failure: Secondary | ICD-10-CM | POA: Clinically undetermined

## 2012-12-03 LAB — BASIC METABOLIC PANEL
CO2: 21 mEq/L (ref 19–32)
Glucose, Bld: 183 mg/dL — ABNORMAL HIGH (ref 70–99)
Potassium: 3.9 mEq/L (ref 3.5–5.1)
Sodium: 140 mEq/L (ref 135–145)

## 2012-12-03 LAB — GLUCOSE, CAPILLARY
Glucose-Capillary: 141 mg/dL — ABNORMAL HIGH (ref 70–99)
Glucose-Capillary: 148 mg/dL — ABNORMAL HIGH (ref 70–99)

## 2012-12-03 MED ORDER — LOSARTAN POTASSIUM 25 MG PO TABS
25.0000 mg | ORAL_TABLET | Freq: Every day | ORAL | Status: DC
  Administered 2012-12-03 – 2012-12-07 (×5): 25 mg via ORAL
  Filled 2012-12-03 (×5): qty 1

## 2012-12-03 MED ORDER — LEVETIRACETAM 500 MG PO TABS
500.0000 mg | ORAL_TABLET | Freq: Two times a day (BID) | ORAL | Status: DC
  Administered 2012-12-03 – 2012-12-07 (×9): 500 mg via ORAL
  Filled 2012-12-03 (×10): qty 1

## 2012-12-03 MED ORDER — ATORVASTATIN CALCIUM 40 MG PO TABS
40.0000 mg | ORAL_TABLET | Freq: Every day | ORAL | Status: DC
  Administered 2012-12-03 – 2012-12-07 (×5): 40 mg via ORAL
  Filled 2012-12-03 (×5): qty 1

## 2012-12-03 MED ORDER — ALTEPLASE 2 MG IJ SOLR
6.0000 mg | Freq: Once | INTRAMUSCULAR | Status: AC
  Administered 2012-12-03: 6 mg
  Filled 2012-12-03: qty 6

## 2012-12-03 MED ORDER — CARVEDILOL 6.25 MG PO TABS
6.2500 mg | ORAL_TABLET | Freq: Two times a day (BID) | ORAL | Status: DC
  Administered 2012-12-03 – 2012-12-07 (×9): 6.25 mg via ORAL
  Filled 2012-12-03 (×10): qty 1

## 2012-12-03 MED ORDER — OXYCODONE HCL 5 MG PO TABS: 5.0000 mg | ORAL_TABLET | Freq: Four times a day (QID) | ORAL | Status: DC | PRN

## 2012-12-03 NOTE — Progress Notes (Signed)
PICC dressing and line checked - tpa removed but PICC seems positional so dressing removed and site checked - flushed easily and good blood return noted at this time - IV fluids via white port.

## 2012-12-03 NOTE — Progress Notes (Signed)
Intercourse for :  Vancomycin, Meropenem Indication:  pneumonia and right foot infection  Hospital Problems Active Problems:   DM2 (diabetes mellitus, type 2)   Right foot ulcer   Hypertension   Anemia   Bilateral pneumonia   Acute respiratory failure with hypoxia   Encephalopathy acute   CKD (chronic kidney disease) stage 3, GFR 30-59 ml/min   Charcot's joint of foot   Weight: 92 kg  Vitals: BP 133/70  Pulse 87  Temp(Src) 98.3 F (36.8 C) (Oral)  Resp 18  Ht 5' 10.08" (1.78 m)  Wt 203 lb 0.7 oz (92.1 kg)  BMI 29.07 kg/m2  SpO2 100%  Labs:  Recent Labs  12/01/12 0500 12/02/12 0630 12/03/12 0500  WBC 8.8 10.9*  --   HGB 10.4* 10.1*  --   PLT 315 420*  --   CREATININE 1.38* 1.53* 1.51*   Estimated Creatinine Clearance: 55.7 ml/min (by C-G formula based on Cr of 1.51).   Microbiology: Recent Results (from the past 720 hour(s))  CULTURE, BLOOD (ROUTINE X 2)     Status: None   Collection Time    11/17/12 12:40 AM      Result Value Range Status   Specimen Description BLOOD LEFT ARM   Final   Special Requests BOTTLES DRAWN AEROBIC ONLY [facility]   Final   Culture  Setup Time     Final   Value: 11/17/2012 04:24     Performed at [facility]   Culture     Final   Value: NO GROWTH 5 DAYS     Performed at [facility]   Report Status 11/23/2012 FINAL   Final  CULTURE, BLOOD (ROUTINE X 2)     Status: None   Collection Time    11/17/12  2:58 AM      Result Value Range Status   Specimen Description BLOOD LEFT ARM   Final   Special Requests BOTTLES DRAWN AEROBIC ONLY [facility]   Final   Culture  Setup Time     Final   Value: 11/17/2012 16:34     Performed at [facility]   Culture     Final   Value: NO GROWTH 5 DAYS     Performed at [facility]   Report Status 11/23/2012 FINAL   Final  MRSA PCR SCREENING     Status: None   Collection Time    11/17/12  5:00 AM      Result Value Range Status   MRSA by  PCR NEGATIVE  NEGATIVE Final   Comment:            The GeneXpert MRSA Assay (FDA     approved for NASAL specimens     only), is one component of a     comprehensive MRSA colonization     surveillance program. It is not     intended to diagnose MRSA     infection nor to guide or     monitor treatment for     MRSA infections.  CULTURE, BLOOD (ROUTINE X 2)     Status: None   Collection Time    11/26/12 12:15 PM      Result Value Range Status   Specimen Description BLOOD RIGHT PICC LINE   Final   Special Requests BOTTLES DRAWN AEROBIC ONLY 10CC   Final   Culture  Setup Time     Final   Value: 11/26/2012 16:23     Performed at  [facility]     Final   Value: NO GROWTH 5 DAYS     Performed at [facility]   Report Status 12/02/2012 FINAL   Final  CULTURE, BLOOD (ROUTINE X 2)     Status: None   Collection Time    11/26/12 12:20 PM      Result Value Range Status   Specimen Description BLOOD LEFT HAND   Final   Special Requests BOTTLES DRAWN AEROBIC ONLY 5CC   Final   Culture  Setup Time     Final   Value: 11/26/2012 16:23     Performed at [facility]   Culture     Final   Value: NO GROWTH 5 DAYS     Performed at [facility]   Report Status 12/02/2012 FINAL   Final  URINE CULTURE     Status: None   Collection Time    11/26/12  3:19 PM      Result Value Range Status   Specimen Description URINE, RANDOM   Final   Special Requests NONE   Final   Culture  Setup Time     Final   Value: 11/26/2012 16:16     Performed at [place] [facility]     Final   Value: 3,000 COLONIES/ML     Performed at [facility]   Culture     Final   Value: INSIGNIFICANT GROWTH     Performed at [facility]   Report Status 11/27/2012 FINAL   Final  MRSA PCR SCREENING     Status: None   Collection Time    11/29/12  5:22 PM      Result Value Range Status   MRSA by PCR NEGATIVE  NEGATIVE Final   Comment:            The  GeneXpert MRSA Assay (FDA     approved for NASAL specimens     only), is one component of a     comprehensive MRSA colonization     surveillance program. It is not     intended to diagnose MRSA     infection nor to guide or     monitor treatment for     MRSA infections.    Anti-infectives Anti-infectives   Start     Dose/Rate Route Frequency Ordered Stop   11/30/12 2200  meropenem (MERREM) 1 g in sodium chloride 0.9 % 100 mL IVPB     1 g 200 mL/hr over 30 Minutes Intravenous Every 12 hours 11/30/12 1604     11/29/12 1700  meropenem (MERREM) 1 g in sodium chloride 0.9 % 100 mL IVPB  Status:  Discontinued     1 g 200 mL/hr over 30 Minutes Intravenous Every 12 hours 11/29/12 1542 11/29/12 1602   11/29/12 1700  meropenem (MERREM) 1 g in sodium chloride 0.9 % 100 mL IVPB  Status:  Discontinued     1 g 200 mL/hr over 30 Minutes Intravenous 3 times per day 11/29/12 1602 11/30/12 1604   11/27/12 1515  vancomycin (VANCOCIN) IVPB 1000 mg/200 mL premix     1,000 mg 200 mL/hr over 60 Minutes Intravenous Every 24 hours 11/26/12 1601     11/26/12 1600  aztreonam (AZACTAM) 2 g in dextrose 5 % 50 mL IVPB  Status:  Discontinued     2 g 100 mL/hr over 30 Minutes Intravenous Every 8 hours 11/26/12 1536 11/29/12 1541  11/26/12 1445  imipenem-cilastatin (PRIMAXIN) 500 mg in sodium chloride 0.9 % 100 mL IVPB     500 mg 200 mL/hr over 30 Minutes Intravenous To Emergency Dept 11/26/12 1429 11/26/12 1729   11/26/12 1430  vancomycin (VANCOCIN) IVPB 1000 mg/200 mL premix     1,000 mg 200 mL/hr over 60 Minutes Intravenous  Once 11/26/12 1429 11/26/12 1613      Assessment:  Day # 8 of Vancomycin, Day # 5 Meropenem for PNA and foot infection.    Last Vancomycin trough 17.7 mcg/ml.  Based on his Vancomycin clearance, it appears his creatinine clearance likely overestimated.  Scr stable, UOP good.  Last WBC10.9.  Patient is afebrile.  Goal of Therapy:   Vancomycin trough level 15-20  mcg/ml Meropenem selected for infection/cultures and adjusted for renal function.   Plan:   Continue Vancomycin 1 gm IV q 24 hours.  Continue Meropenem 1 gm IV q 12 hours. Follow up SCr, UOP, cultures, clinical course and adjust as clinically indicated.   Estelle June, Pharm.D.  12/03/2012 10:45 AM

## 2012-12-03 NOTE — Progress Notes (Signed)
Occupational Therapy Evaluation Patient Details Name: [name]. MRN: [id number] DOB: 02/02/1946 Today's Date: 12/03/2012 Time: [id number] OT Time Calculation (min): 37 min  OT Assessment / Plan / Recommendation History of present illness Recently discharged from the hospital for PNA. He was noted to be profoundly hypoglycemic on the morning of admit with bs as low as the 20's. Acute respiratory failure with ETT 9/12-9/13 self extubated. Chronic right foot wound followed by Dr.Duda currently WBAT   Clinical Impression    Pt presents with functional decline regarding ADL and mobility. Pt will have 24/7 assistance form God daughter,  who is a CNA. Discussed home modifications to make ADL and mobility safer and decrease fall risk. Also discussed need to closely monitor residual limb given recent hospitalization and muscle mass changes. Pt will benefit from skilled OT services to facilitate D/C to home with 24/7 assistance from family with [place] due to below deficits.  OT Assessment  Patient needs continued OT Services    Follow Up Recommendations  Home health OT    Barriers to Discharge      Equipment Recommendations  None recommended by OT    Recommendations for Other Services    Frequency  Min 2X/week    Precautions / Restrictions Precautions Precautions: Fall Precaution Comments: right foot wound Required Braces or Orthoses: Other Brace/Splint Other Brace/Splint: L LE Prosthetic, post op shoe Right Restrictions Weight Bearing Restrictions: No RLE Weight Bearing: Weight bearing as tolerated LLE Weight Bearing: Weight bearing as tolerated   Pertinent Vitals/Pain no apparent distress     ADL  Grooming: Set up Where Assessed - Grooming: Supported sitting Upper Body Bathing: Set up Where Assessed - Upper Body Bathing: Supported sitting Lower Body Bathing: Moderate assistance Where Assessed - Lower Body Bathing: Supported sit to stand Upper Body Dressing: Set up Where  Assessed - Upper Body Dressing: Unsupported sitting Lower Body Dressing: Moderate assistance Where Assessed - Lower Body Dressing: Supported sit to Lobbyist: Maximal assistance Toilet Transfer Method: Sit to Loss adjuster, chartered: Other (comment) (simulated with recliner) Equipment Used: Gait belt;Rolling walker Transfers/Ambulation Related to ADLs: Max A from lower surface ADL Comments: family assists as needed. Discussed importance of skin checks, especialy after hospitalization    OT Diagnosis: Generalized weakness;Acute pain;Cognitive deficits  OT Problem List: Decreased strength;Decreased range of motion;Decreased activity tolerance;Impaired balance (sitting and/or standing);Decreased safety awareness;Decreased knowledge of use of DME or AE;Decreased knowledge of precautions;Cardiopulmonary status limiting activity;Impaired sensation;Obesity;Pain;Increased edema OT Treatment Interventions: Self-care/ADL training;Therapeutic exercise;Energy conservation;DME and/or AE instruction;Therapeutic activities;Cognitive remediation/compensation;Balance training;Patient/family education   OT Goals(Current goals can be found in the care plan section) Acute Rehab OT Goals Patient Stated Goal: to go home OT Goal Formulation: With patient/family Time For Goal Achievement: 12/17/12 Potential to Achieve Goals: Good  Visit Information  Last OT Received On: 12/03/12 Assistance Needed: +1 Reason Eval/Treat Not Completed: Other (comment) (spoke with MD) History of Present Illness: Recently discharged from the hospital for PNA. He was noted to be profoundly hypoglycemic on the morning of admit with bs as low as the 20's. Acute respiratory failure with ETT 9/12-9/13 self extubated. Chronic left foot wound followed by Dr.Duda currently WBAT       Prior [place] expects to be discharged to:: Private residence Living Arrangements:  Spouse/significant other;Other relatives (Lives with wife and Film/video editor) Available Help at Discharge: Family;Available 24 hours/day Type of Home: House Home Access: Ramped entrance (Temporary ramp in place) Entrance Stairs-Number of Steps:  2 Entrance Stairs-Rails: None Home Layout: One level Home Equipment: Bedside commode;Shower seat;Walker - 2 wheels;Wheelchair - [employer] - 4 wheels Prior Function Level of Independence: Needs assistance Gait / Transfers Assistance Needed: Uses WC for primary mobility and RW with prosthesis for transfers. ADL's / Homemaking Assistance Needed: Wife and Goddaughter assisted pt with prostetic. Comments: Pt's bedroom setup at home allows RW to be secured and pt uses UEs to pull up to stand. Pt uses head resting against wall to assist with standing balance. Pt hops on LLE with RW to transfer.  Communication Communication: No difficulties         Vision/Perception Vision - History Baseline Vision: Wears glasses all the time   Cognition  Cognition Arousal/Alertness: Awake/alert Behavior During Therapy: WFL for tasks assessed/performed Overall Cognitive Status: Within Functional Limits for tasks assessed    Extremity/Trunk Assessment Upper Extremity Assessment RUE Deficits / Details: Functional weakness observed with use of RW, pt reports he is better at pulling than pushing. LUE Deficits / Details: Functional weakness observed with use of RW, pt reports he is better at pulling than pushing. 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 RLE Sensation: history of peripheral neuropathy     Mobility Bed Mobility Bed Mobility: Not assessed Supine to Sit: 5: Supervision;With rails;HOB elevated Sitting - Scoot to Edge of Bed: 6: Modified independent (Device/Increase time) Details for Bed Mobility Assistance: pt in chair Transfers Transfers: Sit to Stand;Stand to  Sit Sit to Stand: 2: Max assist;From chair/3-in-1 Stand to Sit: 2: Max assist;To chair/3-in-1 Details for Transfer Assistance: cueing for hand placement, sequence and safety     Exercise General Exercises - Lower Extremity Long Arc Quad: AROM;Right;20 reps;Seated Hip Flexion/Marching: AROM;Both;20 reps;Seated Other Exercises Other Exercises: BUE theraband exercises Other Exercises: chair push ups Other Exercises: achilles stretches with sheet   Balance Balance Balance Assessed: Yes Dynamic Sitting Balance Dynamic Sitting - Balance Support: During functional activity;Feet supported;Bilateral upper extremity supported Dynamic Sitting - Level of Assistance: 5: Stand by assistance Dynamic Sitting Balance - Compensations: Pt able to maintin sitting balance while donning prosthesis. Static Standing Balance Static Standing - Balance Support: Bilateral upper extremity supported;During functional activity Static Standing - Level of Assistance: 3: Mod assist;Other (comment) (posterior lean)   End of Session OT - End of Session Equipment Utilized During Treatment: Gait belt;Rolling walker Activity Tolerance: Patient tolerated treatment well Patient left: in chair;with call bell/phone within reach;with family/visitor present Nurse Communication: Mobility status  GO     Melida Northington,HILLARY 12/03/2012, 12:49 PM [facility], OTR/L  [phone] 12/03/2012

## 2012-12-03 NOTE — Progress Notes (Signed)
Physical Therapy Treatment Patient Details Name: [name]. MRN: [id number] DOB: [birth year] Today's Date: 12/03/2012 Time: [id number] PT Time Calculation (min): 34 min  PT Assessment / Plan / Recommendation  History of Present Illness Recently discharged from the hospital for PNA. He was noted to be profoundly hypoglycemic on the morning of admit with bs as low as the 20's. Acute respiratory failure with ETT 9/12-9/13 self extubated. Chronic left foot wound followed by Dr.Duda currently WBAT   PT Comments   Pt continues to progress with mobility and function. Pt educated for maintaining weight off of right heel when at rest. Pt encourage to mobilize with nursing.  Follow Up Recommendations        Does the patient have the potential to tolerate intense rehabilitation     Barriers to Discharge        Equipment Recommendations       Recommendations for Other Services    Frequency     Progress towards PT Goals Progress towards PT goals: Progressing toward goals  Plan Current plan remains appropriate    Precautions / Restrictions Precautions Precautions: Fall Precaution Comments: right foot wound Other Brace/Splint: L LE Prosthetic, post op shoe Right Restrictions RLE Weight Bearing: Weight bearing as tolerated LLE Weight Bearing: Weight bearing as tolerated   Pertinent Vitals/Pain No pain VSS    Mobility  Bed Mobility Supine to Sit: 5: Supervision;With rails;HOB elevated Sitting - Scoot to Edge of Bed: 6: Modified independent (Device/Increase time) Details for Bed Mobility Assistance: increased time with cueing to ease transition Transfers Sit to Stand: 4: Min guard;From elevated surface;From bed Stand to Sit: To chair/3-in-1;4: Min guard;With armrests Details for Transfer Assistance: cueing for hand placement, sequence and safety particularly to control descent to chair Ambulation/Gait Ambulation/Gait Assistance: 4: Min guard Ambulation Distance (Feet): 300  Feet Assistive device: Rolling walker Ambulation/Gait Assistance Details: cueing for posture and position in RW but improved safety and stability from prior visit Gait Pattern: Step-through pattern;Decreased stride length;Trunk flexed Gait velocity: decreased Stairs: No    Exercises     PT Diagnosis:    PT Problem List:   PT Treatment Interventions:     PT Goals (current goals can now be found in the care plan section)    Visit Information  Last PT Received On: 12/03/12 Assistance Needed: +1 History of Present Illness: Recently discharged from the hospital for PNA. He was noted to be profoundly hypoglycemic on the morning of admit with bs as low as the 20's. Acute respiratory failure with ETT 9/12-9/13 self extubated. Chronic left foot wound followed by Dr.Duda currently WBAT    Subjective Data      Cognition  Cognition Arousal/Alertness: Awake/alert Behavior During Therapy: WFL for tasks assessed/performed Overall Cognitive Status: Within Functional Limits for tasks assessed    Balance     End of Session PT - End of Session Equipment Utilized During Treatment: Gait belt Activity Tolerance: Patient tolerated treatment well Patient left: in chair;with call bell/phone within reach;with family/visitor present Nurse Communication: Mobility status   GP     Melford Aase 12/03/2012, 10:07 AM Elwyn Reach, [place]

## 2012-12-03 NOTE — Progress Notes (Signed)
[facility] Progress Note [place] TEAM 1 - Stepdown ICU Team   [name]. [id number] DOB: [birth year] DOA: 11/26/2012 PCP: [name], MD  Brief narrative: 67 y/o admitted 8/30-9/8 per [facility] for HCAP and continued wound care for R foot. MRI 9/1 demonstrated diffuse soft tissue infection & osteo. Discharged home on oral cipro & IV vanco after Ortho evaluation. Returned to [facility] ER on 9/9 with profound hypoglycemia (pt was on both lantus and 70/30 at time of discharge as [place] [facility] REGIMEN as was listed on his med rec). 9/12 - Acute respiratory decompensation requiring intubation.   Assessment/Plan:    Acute respiratory failure with hypoxia due to: A) Bilateral pneumonia -improved and stable on RA -cont empiric anbx's -?of acute aspiration in setting of encephalopathy at admit- SLP eval B) Acute systolic heart failure/EF [id number] Grade 1 diastolic dysfunction -appears new so consult Cards -no prior ECHO so unclear if this is acute (due to stressors of sepsis/anemia) or progression of chronic HF - pt says has never been told in past had "heart failure" -due to ARF and tachycardia Lasix started earlier in admit was dc'd 9/15 in favor of gentle hydration which will dc am 9/17 -? Respiratory failure requiring intubation likely combo PNA and CHF    DM2 (diabetes mellitus, type 2) with hypoglycemia -CBG's more stable -resume Lantus - pt told MD's at previous admit he took both Lantus and 70/30 for adequate glycemic control-will try to see if can manage CBG's on Lantus and meal coverage here thus continue after dc -cont SSI -hypoglycemia pre admit suspected due to poor intake in setting of aggressive glycemic control regimen    Encephalopathy acute -resolved   Seizure disorder -no evidence of Sz activity  -lengthy d/w pt re R/B of Dilantin vs Keppra and plan is to DC Dilantin in favor of Keppra    CKD (chronic kidney  disease) stage 3, GFR 30-59 ml/min -Scr and BUN has increased so IVF as above    Right foot ulcer/Charcot's joint of foot -Followed by [name] as OP - unable to give po narcotics for pain due to anaphylasix to Codeine - med rec states he was taking Tramadol but it lowers seizure threshold and therefore I have not resumed it.     Hypertension -BP controlled-Lopressor resumed 9/14   Tachycardia -resolved after hydration   Anemia -Hgb stable   DVT prophylaxis: Subcutaneous heparin Code Status: Full Family Communication: Patient Disposition Plan/Expected LOS: Remain on telemetry  Consultants: Ortho PCCM  Procedures: 2-D echocardiogram  - Left ventricle: The cavity size was mildly dilated. Wall thickness was normal. Systolic function was severely reduced. The estimated ejection fraction was in the range of 30% to 35%. Doppler parameters are consistent with abnormal left ventricular relaxation (grade 1 diastolic dysfunction). Doppler parameters are consistent with elevated mean left atrial filling pressure. There was a false tendon. . - Regional wall motion abnormality: Moderate hypokinesis of the mid anterior and basal-mid anteroseptal myocardium; mild hypokinesis of the apical anterior and basal inferior myocardium. - Mitral valve: Mild regurgitation. - Left atrium: The atrium was severely dilated. - Atrial septum: No defect or patent foramen ovale was identified. - Pulmonary arteries: PA peak pressure: 61mm Hg (S). - Pericardium, extracardiac: There was a left pleural effusion.  Lower extremity venous duplex  - No evidence of deep vein or superficial thrombosis involving the right lower extremity and left lower extremity. - No evidence of Baker's cyst  on the right or left.   CULTURES:  UC 9/9>>>ng  BCx2 9/9>>>ng  Antibiotics: Meropenem 9/9>>>  Vanco 9/9>>>  HPI/Subjective: Patient alert and sitting up in chair. No CP or SOB. Explanations given for new dx CHF  and rationale for changing anti convulsants. Endorsed pain in right heel with weight bearing.  Objective: Blood pressure 125/65, pulse 100, temperature 98.3 F (36.8 C), temperature source Oral, resp. rate 18, height 5' 10.08" (1.78 m), weight 92.1 kg (203 lb 0.7 oz), SpO2 100.00%.  Intake/Output Summary (Last 24 hours) at 12/03/12 1326 Last data filed at 12/03/12 0900  Gross per 24 hour  Intake    350 ml  Output    252 ml  Net     98 ml    Exam: General: No acute respiratory distress Lungs: Clear to auscultation bilaterally without wheezes or crackles, RA Cardiovascular: Regular rate and rhythm without murmur gallop or rub normal S1 and S2, no JVD but does have 1+ edema RLE Abdomen: Nontender, nondistended, soft, bowel sounds positive, no rebound, no ascites, no appreciable mass Musculoskeletal: No significant cyanosis, clubbing of bilateral lower extremities-prior left lower extremity amputation with prosthesis in place Neurological: Alert and oriented x 3, moves all extremities x 4 without focal neurological deficits, CN 2-12 intact  Scheduled Meds:  Scheduled Meds: . aspirin  325 mg Oral Daily  . atorvastatin  40 mg Oral q1800  . carvedilol  6.25 mg Oral BID WC  . heparin  5,000 Units Subcutaneous Q8H  . insulin aspart  0-15 Units Subcutaneous TID WC  . insulin glargine  24 Units Subcutaneous QHS  . latanoprost  1 drop Both Eyes QHS  . levETIRAcetam  500 mg Oral BID  . losartan  25 mg Oral Daily  . meropenem (MERREM) IV  1 g Intravenous Q12H  . silver sulfADIAZINE   Topical Daily  . tamsulosin  0.4 mg Oral QPC supper  . timolol  1 drop Both Eyes Daily  . vancomycin  1,000 mg Intravenous Q24H    Data Reviewed: Basic Metabolic Panel:  Recent Labs Lab 11/29/12 1740 11/30/12 0353 12/01/12 0500 12/02/12 0630 12/03/12 0500  NA 133* 136 139 140 140  K 5.1 4.3 3.6 3.8 3.9  CL 106 108 108 109 109  CO2 19 18* 20 21 21   GLUCOSE 153* 96 111* 204* 183*  BUN 19 18 17 20  21   CREATININE 1.36* 1.40* 1.38* 1.53* 1.51*  CALCIUM 8.0* 8.4 8.3* 8.2* 7.7*   Liver Function Tests:  Recent Labs Lab 11/29/12 0500 11/29/12 1740 11/30/12 0353 12/02/12 0630  AST  --  19  --   --   ALT  --  13  --   --   ALKPHOS  --  111  --   --   BILITOT  --  0.1*  --   --   PROT  --  6.3  --   --   ALBUMIN 1.7* 1.7* 1.7* 1.6*   CBC:  Recent Labs Lab 11/29/12 0500 11/29/12 1740 11/30/12 0353 12/01/12 0500 12/02/12 0630  WBC 13.6* 13.2* 10.9* 8.8 10.9*  NEUTROABS  --  10.8*  --   --   --   HGB 9.2* 9.2* 9.2* 10.4* 10.1*  HCT 27.9* 29.3* 29.2* 32.1* 31.0*  MCV 88.3 89.9 89.3 87.2 86.6  PLT 439* 431* 383 315 420*   Cardiac Enzymes:  Recent Labs Lab 11/29/12 1740 11/29/12 2331 11/30/12 0353  TROPONINI <0.30 <0.30 <0.30   BNP (last 3 results)  Recent Labs  11/17/12 0016 11/29/12 1740  PROBNP 4406.0* 7199.0*   CBG:  Recent Labs Lab 12/02/12 1157 12/02/12 1637 12/02/12 2059 12/03/12 0750 12/03/12 1129  GLUCAP 229* 236* 246* 141* 148*    Recent Results (from the past 240 hour(s))  CULTURE, BLOOD (ROUTINE X 2)     Status: None   Collection Time    11/26/12 12:15 PM      Result Value Range Status   Specimen Description BLOOD RIGHT PICC LINE   Final   Special Requests BOTTLES DRAWN AEROBIC ONLY 10CC   Final   Culture  Setup Time     Final   Value: 11/26/2012 16:23     Performed at [facility]   Culture     Final   Value: NO GROWTH 5 DAYS     Performed at [facility]   Report Status 12/02/2012 FINAL   Final  CULTURE, BLOOD (ROUTINE X 2)     Status: None   Collection Time    11/26/12 12:20 PM      Result Value Range Status   Specimen Description BLOOD LEFT HAND   Final   Special Requests BOTTLES DRAWN AEROBIC ONLY 5CC   Final   Culture  Setup Time     Final   Value: 11/26/2012 16:23     Performed at [facility]   Culture     Final   Value: NO GROWTH 5 DAYS     Performed at [facility]   Report Status  12/02/2012 FINAL   Final  URINE CULTURE     Status: None   Collection Time    11/26/12  3:19 PM      Result Value Range Status   Specimen Description URINE, RANDOM   Final   Special Requests NONE   Final   Culture  Setup Time     Final   Value: 11/26/2012 16:16     Performed at [facility]     Final   Value: 3,000 COLONIES/ML     Performed at [facility]   Culture     Final   Value: INSIGNIFICANT GROWTH     Performed at [facility]   Report Status 11/27/2012 FINAL   Final  MRSA PCR SCREENING     Status: None   Collection Time    11/29/12  5:22 PM      Result Value Range Status   MRSA by PCR NEGATIVE  NEGATIVE Final   Comment:            The GeneXpert MRSA Assay (FDA     approved for NASAL specimens     only), is one component of a     comprehensive MRSA colonization     surveillance program. It is not     intended to diagnose MRSA     infection nor to guide or     monitor treatment for     MRSA infections.     Studies:  Recent x-ray studies have been reviewed in detail by the Attending Physician  Erin Hearing, ANP [facility] Office  [phone] Pager [phone]  **If unable to reach the above provider after paging please contact the [facility] @ [phone]  On-Call/Text Page:      [email or web address]      password [email or web address]  If 7PM-7AM, please contact night-coverage [email or web address] Password [email or web address] 12/03/2012, 1:26 PM   LOS: 7 days    I have  examined the patient, reviewed the chart and modified the above note which I agree with.   Merleen Picazo,MD [id number] 12/03/2012, 5:37 PM

## 2012-12-03 NOTE — Consult Note (Signed)
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 to level of the gastric fundus. Visualized bowel gas pattern is non obstructed.  IMPRESSION: NG tube tip at the level of the gastric body.   Electronically Signed   By: Lars Pinks M.D.   On: 11/29/2012 19:13   EKG: NSR 78bpm old anterior infarct, nonspecific ST-T wave changes QTc appears slightly prolonged at 505 (Read out at 529 by ECG), QRS 80 Previous QTCs were wnl Tele: NSR.   Physical Exam: Blood pressure 125/65, pulse 100, temperature 98.3 F (36.8 C), temperature source Oral,  resp. rate 18, height 5' 10.08" (1.78 m), weight 203 lb 0.7 oz (92.1 kg), SpO2 100.00%. General: Well developed, well nourished AAM in no acute distress. Head: Normocephalic, atraumatic, sclera non-icteric, no xanthomas, nares are without discharge.  Neck: Negative for carotid bruits. JVD not elevated. Mild thyroid fullness bilaterally Lungs: Clear bilaterally to auscultation without wheezes, rales, or rhonchi. Breathing is unlabored. Heart: RRR with S1 S2. No murmurs, rubs, or gallops appreciated. Abdomen: Soft, non-tender, non-distended with normoactive bowel sounds. No hepatomegaly. No rebound/guarding. No obvious abdominal masses. Msk:  Strength and tone appear normal for age. Extremities: No clubbing or  cyanosis. S/p L BKA. RLE with shiny mild edema. Neuro: Alert and oriented X 3. No facial asymmetry. No focal deficit. Moves all extremities spontaneously. Psych:  Responds to questions appropriately with a normal affect.   Assessment and Plan:   1. Hypoglycemia/hypothermia, resolved 2. Acute hypoxemic respiratory failure requiring ventilator, resolved 3. Bilateral HCAP 4. Recent sepsis complicated by AKI, toxic metabolic encephalopathy, uremia, and anemia of chronic disease 5. Osteomyelitis s/p R foot surgery [id number] complicated by infection as above 6. Severe hypoalbuminemia 7. Cardiomyopathy EF 30-35% with +WMA, duration unclear 8. Insulin dependent diabetes mellitus 9. CKD stage III 10. Seizure disorder, on dilantin 11. QTc prolongation on last ECG  With comorbidities, it is difficult to tell chronicity of LV dysfunction. He is not currently reporting any ischemic symptoms - in light of recent sepsis, anemia, and renal disease, will defer cath for now and plan stress testing with timing at MD discretion. He ruled out for MI this admission. Change metoprolol to Coreg 6.25mg  BID, add low dose ARB and follow Cr. If Cr remains stable, could add low dose diuretic tomorrow although not grossly volume overloaded on exam. Weight is up but hypoalbuminemia may also be playing a role. Dietary consult for CHF education. Check thyroid function tests and lipids. With PAD, will add statin. Continue ASA. Repeat ECG today to reassess QTc. Will need consideration for LV/ICD if EF remains low despite med rx. Per Dr. Kyla Balzarine request, left message on office voicemail establishing a f/u appt for 2-3 months with him in clinic to ensure f/u.  Signed, Melina Copa PA-C 12/03/2012, 12:58 PM  Patient examined chart reviewed.  Primary issue now is healing of left foot.  No chest pain.  Multiple comorbidities.  Favor advancing medical Rx as described above and make sure Cr stays stable.  Consider adding low dose diuretic  after ACE.  Outpatient myovue to risk stratify after infection and vascular issues more stable  [name]

## 2012-12-03 NOTE — Clinical Documentation Improvement (Signed)
THIS DOCUMENT IS NOT A PERMANENT PART OF THE MEDICAL RECORD  Please update your documentation with the medical record to reflect your response to this query. If you need help knowing how to do this please call [phone].  12/03/12  Dear Dr. [facility]  In a better effort to capture your patient's severity of illness, reflect appropriate length of stay and utilization of resources, a review of the patient medical record has revealed the following indicators.    Based on your clinical judgment, please clarify and document in a progress note and/or discharge summary the clinical condition associated with the following supporting information:  In responding to this query please exercise your independent judgment.  The fact that a query is asked, does not imply that any particular answer is desired or expected.  Possible Clinical Conditions?   Aspiration Pneumonia (POA?) Gram Negative Pneumonia (POA?) Pneumonia (CAP, HAP) (POA?) Other Condition Cannot Clinically Determine   Supporting Information:  Signs & Symptoms:(As per notes)" Rule out Aspiration PNA - RUL ?"  "? &Aspiration Event - with n/v prior to intubation" , "Possible etiologies include aspiration since he had an episode of nausea vomiting this afternoon prior to this deterioration,"  Pt intubated for assisted ventilation.on 11-29-12   Diagnostics:  Lab: (As per notes)11-26-12  WBC:15.8   Radiology:  CXR 11-27-22  IMPRESSION: Patchy right upper lobe and bilateral lower lobe opacities. Slight improvement in aeration in the lung bases.  CXR 11-29-12  "Slight increase in degree of bilateral infiltrates when compared with the prior exam.  CT CHEST 11-29-12  "Large bilateral pleural effusions with compressive atelectasis in both lungs. Airspace disease in the upper lobes, right greater the left concerning for pneumonia."  You may use possible, probable, or suspect with inpatient documentation. possible, probable, suspected  diagnoses MUST be documented at the time of discharge    Thank You,   Alessandra Grout RN, BSN, [place] Documentation Specialist:  [phone]  Cell: [place]     Documentation updated.

## 2012-12-03 NOTE — Progress Notes (Signed)
Nutrition Consult--Brief Note  Received nutrition consult for new onset HF diet education. Patient not appropriate for diet education at this time. He barely opened his eyes as RD tried to engage him in conversation. Left HF education handout at bedside for patient to review. RD to follow-up at a more appropriate date/time for diet education.  Molli Barrows, RD, LDN, [place] Pager [phone] After Hours Pager [phone]

## 2012-12-04 LAB — LIPID PANEL
Cholesterol: 139 mg/dL (ref 0–200)
Total CHOL/HDL Ratio: 3.6 RATIO
VLDL: 27 mg/dL (ref 0–40)

## 2012-12-04 LAB — COMPREHENSIVE METABOLIC PANEL
AST: 24 U/L (ref 0–37)
Albumin: 1.5 g/dL — ABNORMAL LOW (ref 3.5–5.2)
Alkaline Phosphatase: 92 U/L (ref 39–117)
Chloride: 107 mEq/L (ref 96–112)
Creatinine, Ser: 1.5 mg/dL — ABNORMAL HIGH (ref 0.50–1.35)
Potassium: 3.7 mEq/L (ref 3.5–5.1)
Total Bilirubin: 0.1 mg/dL — ABNORMAL LOW (ref 0.3–1.2)

## 2012-12-04 LAB — GLUCOSE, CAPILLARY: Glucose-Capillary: 108 mg/dL — ABNORMAL HIGH (ref 70–99)

## 2012-12-04 MED ORDER — INSULIN GLARGINE 100 UNIT/ML ~~LOC~~ SOLN
20.0000 [IU] | Freq: Every day | SUBCUTANEOUS | Status: DC
  Administered 2012-12-04: 20 [IU] via SUBCUTANEOUS
  Filled 2012-12-04 (×2): qty 0.2

## 2012-12-04 NOTE — Care Management Note (Signed)
    Page 1 of 2   12/06/2012     10:57:40 AM   CARE MANAGEMENT NOTE 12/06/2012  Patient:  [name], [name]   Account Number:  [id number]  Date Initiated:  11/29/2012  Documentation initiated by:  ROYAL,CHERYL  Subjective/Objective Assessment:   Pt active with [facility] for IV antibiotics and wound care as well as HHPT.     Action/Plan:   Have asked MD to order [facility] services to be resumed and IV antibiotics to be resumed as needed.   Anticipated DC Date:  11/29/2012   Anticipated DC Plan:  [place]         Choice offered to / List presented to:          [facility] arranged  HH-1 RN  IV Antibiotics  HH-2 PT      Status of service:  Completed, signed off Medicare Important Message given?   (If response is "NO", the following Medicare IM given date fields will be blank) Date Medicare IM given:   Date Additional Medicare IM given:    Discharge Disposition:  [place]  Per UR Regulation:  Reviewed for med. necessity/level of care/duration of stay  If discussed at [place] of Stay Meetings, dates discussed:   12/05/2012    Comments:   12-06-12 [place], [place] [phone] CM did alert [facility] that pt is to be d/c today. IV vancomycin to be given earlier today and [facility] will visit the pt on SAT. No further needs from CM at this time.   12-04-12 [place], RN,BSN [phone] Pt is active with [facility] services. Pt continues with IV ABX via PICC line. CM will continue to f/u with disposition needs.  12-01-12 2:35pm Luz Lex, RNBSN [phone] REsp distress on 9-12 - intubated - tx to ICU - now extubated.  Plan for discharge home with home health - active with [facility].

## 2012-12-04 NOTE — Progress Notes (Signed)
[facility] PROGRESS NOTE  [name]. [id number] DOB: 14-May-1945 DOA: 11/26/2012 PCP: [name], MD  Assessment/Plan:  Acute respiratory failure with hypoxia due to:  A) Bilateral pneumonia  -improved and stable on RA  -cont empiric anbx's. Meropenem day 6 vancomycin day 10.  -SLP therapy;  recommend regular thin diet.   B) Acute systolic heart failure/EF [id number] Grade 1 diastolic dysfunction  -appears new,  Cards consulted.  -no prior ECHO so unclear if this is acute (due to stressors of sepsis/anemia) or progression of chronic HF - -Lasix was dc'd 9/15 in favor of gentle hydration which was  dc am 9/17 . -? Respiratory failure requiring intubation likely combo PNA and CHF  -Will need to consider resume lasix.   DM2 (diabetes mellitus, type 2) with hypoglycemia  -CBG's more stable  -Will continue with Lantus, decrease to 20 units tonight, CBG this morning at 65.  -cont SSI  -hypoglycemia pre admit suspected due to poor intake in setting of aggressive glycemic control regimen.   Encephalopathy acute  -resolved.    Seizure disorder  -no evidence of Sz activity.  -Dr Francene Boyers lengthy d/w pt re R/B of Dilantin vs Keppra and plan is to DC Dilantin in favor of Keppra   CKD (chronic kidney disease) stage 3, GFR 30-59 ml/min  -cr stable at 1.5. Stable. Monitor on ACE.   Right foot ulcer/Charcot's joint of foot  Was on IV vancomycin and ciprofloxacin. Suppose to be on this medications for 4-6  Weeks. (Counting from 8-12)  I have ask Dr [name] to see patient.   Hypertension  -BP controlled-Lopressor resumed 9/14   Tachycardia  -resolved after hydration   Anemia  -Hgb stable  Code Status: Full Code.  Family Communication: care discussed with wife.  Disposition Plan: home when stable.    Consultants: Ortho  PCCM   Procedures: Lower extremity venous duplex  - No evidence of deep vein or superficial thrombosis involving the right lower extremity and  left lower extremity. - No evidence of Baker's cyst on the right or left.  2-D echocardiogram  - Left ventricle: The cavity size was mildly dilated. Wall thickness was normal. Systolic function was severely reduced. The estimated ejection fraction was in the range of 30% to 35%. Doppler parameters are consistent with abnormal left ventricular relaxation (grade 1 diastolic dysfunction). Doppler parameters are consistent with elevated mean left atrial filling pressure. There was a false tendon. . - Regional wall motion abnormality: Moderate hypokinesis of the mid anterior and basal-mid anteroseptal myocardium; mild hypokinesis of the apical anterior and basal inferior myocardium. - Mitral valve: Mild regurgitation. - Left atrium: The atrium was severely dilated. - Atrial septum: No defect or patent foramen ovale was identified. - Pulmonary arteries: PA peak pressure: 52mm Hg (S). - Pericardium, extracardiac: There was a left pleural effusion.    Antibiotics: Meropenem 9/9>>>  Vanco 9/9>>>   HPI/Subjective: No complaints, feeling well.   Objective: Filed Vitals:   12/04/12 0822  BP: 138/61  Pulse: 98  Temp:   Resp:     Intake/Output Summary (Last 24 hours) at 12/04/12 1319 Last data filed at 12/04/12 0900  Gross per 24 hour  Intake    740 ml  Output    575 ml  Net    165 ml   Filed Weights   11/27/12 2009 11/28/12 2314 12/01/12 0500  Weight: 89.999 kg (198 lb 6.6 oz) 91.67 kg (202 lb 1.5 oz) 92.1 kg (203 lb 0.7 oz)  Exam:   General:  No acute distress.   Cardiovascular: S 1, S 2 RRR  Respiratory: CTA  Abdomen: Bs present, soft, nt, nd  Musculoskeletal:   Data Reviewed: Basic Metabolic Panel:  Recent Labs Lab 11/30/12 0353 12/01/12 0500 12/02/12 0630 12/03/12 0500 12/04/12 0530  NA 136 139 140 140 137  K 4.3 3.6 3.8 3.9 3.7  CL 108 108 109 109 107  CO2 18* 20 21 21 21   GLUCOSE 96 111* 204* 183* 79  BUN 18 17 20 21 20   CREATININE 1.40*  1.38* 1.53* 1.51* 1.50*  CALCIUM 8.4 8.3* 8.2* 7.7* 7.8*   Liver Function Tests:  Recent Labs Lab 11/29/12 0500 11/29/12 1740 11/30/12 0353 12/02/12 0630 12/04/12 0530  AST  --  19  --   --  24  ALT  --  13  --   --  16  ALKPHOS  --  111  --   --  92  BILITOT  --  0.1*  --   --  0.1*  PROT  --  6.3  --   --  5.3*  ALBUMIN 1.7* 1.7* 1.7* 1.6* 1.5*   No results found for this basename: LIPASE, AMYLASE,  in the last 168 hours No results found for this basename: AMMONIA,  in the last 168 hours CBC:  Recent Labs Lab 11/29/12 0500 11/29/12 1740 11/30/12 0353 12/01/12 0500 12/02/12 0630  WBC 13.6* 13.2* 10.9* 8.8 10.9*  NEUTROABS  --  10.8*  --   --   --   HGB 9.2* 9.2* 9.2* 10.4* 10.1*  HCT 27.9* 29.3* 29.2* 32.1* 31.0*  MCV 88.3 89.9 89.3 87.2 86.6  PLT 439* 431* 383 315 420*   Cardiac Enzymes:  Recent Labs Lab 11/29/12 1740 11/29/12 2331 11/30/12 0353  TROPONINI <0.30 <0.30 <0.30   BNP (last 3 results)  Recent Labs  11/17/12 0016 11/29/12 1740  PROBNP 4406.0* 7199.0*   CBG:  Recent Labs Lab 12/03/12 1559 12/03/12 2046 12/04/12 0736 12/04/12 0820 12/04/12 1135  GLUCAP 181* 186* 63* 73 124*    Recent Results (from the past 240 hour(s))  CULTURE, BLOOD (ROUTINE X 2)     Status: None   Collection Time    11/26/12 12:15 PM      Result Value Range Status   Specimen Description BLOOD RIGHT PICC LINE   Final   Special Requests BOTTLES DRAWN AEROBIC ONLY 10CC   Final   Culture  Setup Time     Final   Value: 11/26/2012 16:23     Performed at [facility]   Culture     Final   Value: NO GROWTH 5 DAYS     Performed at [facility]   Report Status 12/02/2012 FINAL   Final  CULTURE, BLOOD (ROUTINE X 2)     Status: None   Collection Time    11/26/12 12:20 PM      Result Value Range Status   Specimen Description BLOOD LEFT HAND   Final   Special Requests BOTTLES DRAWN AEROBIC ONLY 5CC   Final   Culture  Setup Time     Final   Value:  11/26/2012 16:23     Performed at [facility]   Culture     Final   Value: NO GROWTH 5 DAYS     Performed at [facility]   Report Status 12/02/2012 FINAL   Final  URINE CULTURE     Status: None   Collection Time  11/26/12  3:19 PM      Result Value Range Status   Specimen Description URINE, RANDOM   Final   Special Requests NONE   Final   Culture  Setup Time     Final   Value: 11/26/2012 16:16     Performed at [place]     Final   Value: 3,000 COLONIES/ML     Performed at [facility]   Culture     Final   Value: INSIGNIFICANT GROWTH     Performed at [facility]   Report Status 11/27/2012 FINAL   Final  MRSA PCR SCREENING     Status: None   Collection Time    11/29/12  5:22 PM      Result Value Range Status   MRSA by PCR NEGATIVE  NEGATIVE Final   Comment:            The GeneXpert MRSA Assay (FDA     approved for NASAL specimens     only), is one component of a     comprehensive MRSA colonization     surveillance program. It is not     intended to diagnose MRSA     infection nor to guide or     monitor treatment for     MRSA infections.     Studies: No results found.  Scheduled Meds: . aspirin  325 mg Oral Daily  . atorvastatin  40 mg Oral q1800  . carvedilol  6.25 mg Oral BID WC  . heparin  5,000 Units Subcutaneous Q8H  . insulin aspart  0-15 Units Subcutaneous TID WC  . insulin glargine  20 Units Subcutaneous QHS  . latanoprost  1 drop Both Eyes QHS  . levETIRAcetam  500 mg Oral BID  . losartan  25 mg Oral Daily  . meropenem (MERREM) IV  1 g Intravenous Q12H  . silver sulfADIAZINE   Topical Daily  . tamsulosin  0.4 mg Oral QPC supper  . timolol  1 drop Both Eyes Daily  . vancomycin  1,000 mg Intravenous Q24H   Continuous Infusions: . sodium chloride 500 mL (12/04/12 1015)    Active Problems:   DM2 (diabetes mellitus, type 2)   Right foot ulcer   Hypertension   Anemia   Bilateral  pneumonia   Acute respiratory failure with hypoxia   Encephalopathy acute   CKD (chronic kidney disease) stage 3, GFR 30-59 ml/min   Charcot's joint of foot   Acute systolic heart failure-EF 35%    Chronic diastolic heart failure    Time spent: 35 minutes.     Kitrina Maurin  [facility] Pager [phone]. If 7PM-7AM, please contact night-coverage at [email or web address], password [facility] 12/04/2012, 1:19 PM  LOS: 8 days

## 2012-12-04 NOTE — Plan of Care (Signed)
Problem: Food- and Nutrition-Related Knowledge Deficit (NB-1.1) Goal: Nutrition education Formal process to instruct or train a patient/client in a skill or to impart knowledge to help patients/clients voluntarily manage or modify food choices and eating behavior to maintain or improve health. Outcome: Completed/Met Date Met:  12/04/12 Nutrition Education Note  RD consulted for nutrition education regarding new onset CHF. Spoke with patient and his wife about low sodium dietary guidelines.  RD provided "Low Sodium Nutrition Therapy" handout from the Academy of Nutrition and Dietetics. Reviewed patient's dietary recall. Provided examples on ways to decrease sodium intake in diet. Discouraged intake of processed foods and use of salt shaker. Encouraged fresh fruits and vegetables as well as whole grain sources of carbohydrates to maximize fiber intake.   RD discussed why it is important for patient to adhere to diet recommendations, and emphasized the role of fluids, foods to avoid, and importance of weighing self daily. Teach back method used.  Expect good compliance.  Body mass index is 29.07 kg/(m^2). Pt meets criteria for overweight based on current BMI.  Current diet order is heart healthy, patient is consuming approximately 100 % of meals at this time. Labs and medications reviewed. No further nutrition interventions warranted at this time. RD contact information provided. If additional nutrition issues arise, please re-consult RD.   Molli Barrows, RD, LDN, [place] Pager [phone] After Hours Pager [phone]
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
## 2012-12-05 LAB — BASIC METABOLIC PANEL
BUN: 21 mg/dL (ref 6–23)
Calcium: 8.1 mg/dL — ABNORMAL LOW (ref 8.4–10.5)
Creatinine, Ser: 1.45 mg/dL — ABNORMAL HIGH (ref 0.50–1.35)
GFR calc non Af Amer: 49 mL/min — ABNORMAL LOW (ref >90)
Glucose, Bld: 101 mg/dL — ABNORMAL HIGH (ref 70–99)
Sodium: 136 mEq/L (ref 135–145)

## 2012-12-05 LAB — GLUCOSE, CAPILLARY: Glucose-Capillary: 130 mg/dL — ABNORMAL HIGH (ref 70–99)

## 2012-12-05 MED ORDER — INSULIN GLARGINE 100 UNIT/ML ~~LOC~~ SOLN
18.0000 [IU] | Freq: Every day | SUBCUTANEOUS | Status: DC
  Administered 2012-12-05: 18 [IU] via SUBCUTANEOUS
  Filled 2012-12-05 (×2): qty 0.18

## 2012-12-05 MED ORDER — FUROSEMIDE 20 MG PO TABS
10.0000 mg | ORAL_TABLET | Freq: Every day | ORAL | Status: DC
  Administered 2012-12-05 – 2012-12-07 (×3): 10 mg via ORAL
  Filled 2012-12-05 (×3): qty 0.5

## 2012-12-05 MED ORDER — GUAIFENESIN ER 600 MG PO TB12
600.0000 mg | ORAL_TABLET | Freq: Two times a day (BID) | ORAL | Status: DC
  Administered 2012-12-05 – 2012-12-07 (×5): 600 mg via ORAL
  Filled 2012-12-05 (×6): qty 1

## 2012-12-05 NOTE — Progress Notes (Signed)
Patient ID: Devin Rakers., male   DOB: June 25, 1945, 67 y.o.   MRN: [id number]    Subjective:  Denies SSCP, palpitations or Dyspnea Cough  Objective:  Filed Vitals:   12/04/12 1000 12/04/12 1512 12/04/12 1931 12/05/12 0615  BP: 117/55 133/58 122/55 139/78  Pulse: 103 90 88 100  Temp:  99.2 F (37.3 C) 98.1 F (36.7 C) 98 F (36.7 C)  TempSrc:      Resp:  17 18 18   Height:      Weight:      SpO2:  98% 98% 97%    Intake/Output from previous day:  Intake/Output Summary (Last 24 hours) at 12/05/12 0818 Last data filed at 12/04/12 0900  Gross per 24 hour  Intake    240 ml  Output      0 ml  Net    240 ml    Physical Exam: Affect appropriate Chronically ill black male HEENT: normal Neck supple with no adenopathy JVP normal no bruits no thyromegaly Lungs clear with no wheezing and good diaphragmatic motion Heart:  [id number] systolic murmur, no rub, gallop or click PMI normal Abdomen: benighn, BS positve, no tenderness, no AAA no bruit.  No HSM or HJR Distal pulses intact with no bruits RLE plus 2 edema with poor circulation and amputated toes LLE BKA  Neuro non-focal     Lab Results: Basic Metabolic Panel:  Recent Labs  12/04/12 0530 12/05/12 0525  NA 137 136  K 3.7 3.9  CL 107 107  CO2 21 20  GLUCOSE 79 101*  BUN 20 21  CREATININE 1.50* 1.45*  CALCIUM 7.8* 8.1*   Liver Function Tests:  Recent Labs  12/04/12 0530  AST 24  ALT 16  ALKPHOS 92  BILITOT 0.1*  PROT 5.3*  ALBUMIN 1.5*   Fasting Lipid Panel:  Recent Labs  12/04/12 0530  CHOL 139  HDL 39*  LDLCALC 73  TRIG 137  CHOLHDL 3.6   Thyroid Function Tests:  Recent Labs  12/03/12 1810  TSH 1.622    Imaging: No results found.  Cardiac Studies:  ECG:  9/10  NSR rate 79 normal    Telemetry:  nsr no arrhythmia  Echo:  Reviewed EF 30-35%  Medications:   . aspirin  325 mg Oral Daily  . atorvastatin  40 mg Oral q1800  . carvedilol  6.25 mg Oral BID WC  . heparin  5,000  Units Subcutaneous Q8H  . insulin aspart  0-15 Units Subcutaneous TID WC  . insulin glargine  20 Units Subcutaneous QHS  . latanoprost  1 drop Both Eyes QHS  . levETIRAcetam  500 mg Oral BID  . losartan  25 mg Oral Daily  . meropenem (MERREM) IV  1 g Intravenous Q12H  . silver sulfADIAZINE   Topical Daily  . tamsulosin  0.4 mg Oral QPC supper  . timolol  1 drop Both Eyes Daily  . vancomycin  1,000 mg Intravenous Q24H     . sodium chloride 500 mL (12/04/12 1015)    Assessment/Plan:  CHF:  No documented CAD  ECG normal and no chest pain.  Outpatient myovue after initial vascular and limb salvage issues resolved Tolerating beta blocker and ACE CR stable at 1.4  Will add low dose diuretic to help with edema in RLE Chol; continue statin  PVD:  See note by Dr [name] continue local care and vancomycin    [name] 12/05/2012, 8:18 AM

## 2012-12-05 NOTE — Progress Notes (Signed)
Occupational Therapy Treatment Patient Details Name: [name]. MRN: [id number] DOB: [birth year] Today's Date: 12/05/2012 Time: [id number] OT Time Calculation (min): 23 min  OT Assessment / Plan / Recommendation        Follow Up Recommendations  Home health OT       Equipment Recommendations  None recommended by OT       Frequency Min 2X/week   Progress towards OT Goals Progress towards OT goals: Progressing toward goals     Precautions / Restrictions Precautions Precautions: Fall Precaution Comments: right foot wound Required Braces or Orthoses: Other Brace/Splint Other Brace/Splint: L LE Prosthetic, post op shoe Right Restrictions Weight Bearing Restrictions: No RLE Weight Bearing: Weight bearing as tolerated LLE Weight Bearing: Weight bearing as tolerated       ADL  Grooming: Set up Where Assessed - Grooming: Unsupported sitting Upper Body Dressing: Set up Where Assessed - Upper Body Dressing: Unsupported sitting Lower Body Dressing: Moderate assistance Where Assessed - Lower Body Dressing: Supported sit to Lobbyist: Moderate assistance Toilet Transfer Method: Sit to stand Toilet Transfer Equipment: Other (comment) (urinal) Toileting - Clothing Manipulation and Hygiene: Moderate assistance Where Assessed - Toileting Clothing Manipulation and Hygiene: Standing        OT Goals(current goals can now be found in the care plan section)    Visit Information  Last OT Received On: 12/05/12          Cognition  Cognition Arousal/Alertness: Awake/alert Behavior During Therapy: [facility] for tasks assessed/performed Overall Cognitive Status: Within Functional Limits for tasks assessed    Mobility  Bed Mobility Bed Mobility: Supine to Sit Rolling Left: 4: Min assist;With rail Transfers Transfers: Sit to Stand;Stand to Sit Sit to Stand: 3: Mod assist;From bed;With upper extremity assist Stand to Sit: To chair/3-in-1;3: Mod assist;With upper  extremity assist          End of Session OT - End of Session Equipment Utilized During Treatment: Gait belt;Rolling walker Activity Tolerance: Patient tolerated treatment well Patient left: in chair;with call bell/phone within reach;with family/visitor present Nurse Communication: Mobility status  GO     [name] 12/05/2012, 12:26 PM

## 2012-12-05 NOTE — Progress Notes (Signed)
[facility]  Patient Status:   Active pt with [facility] up to this admission  [facility] is providing the following services: [place] RN, [facility] for home IV ABX.  [facility] hospital team will follow [name] and support DC home when deemed appropriate by MD .  If patient discharges after hours, please call [phone].   [name] 12/05/2012, 5:25 PM

## 2012-12-05 NOTE — Progress Notes (Signed)
Orthopedic Tech Progress Note Patient Details:  [name] Apr 16, 1945 [id number] Right unna boot applied; right prafo applied. Tolerated well.  Ortho Devices Type of Ortho Device: Haematologist Ortho Device/Splint Location: Right Unna boot; Right PRAFO Ortho Device/Splint Interventions: Application   Asia R Thompson 12/05/2012, 12:21 PM

## 2012-12-05 NOTE — Consult Note (Signed)
  Patient has developed massive swelling in the right lower extremity. Patient is not wearing his compression stockings. Examination his foot he has serosanguineous drainage from these incision which was previously clean and dry. He is developed new decubitus heel ulcers x2 on the right heel.  I will write orders for Unna compressive wrap for the right lower extremity to be changed every other day. Orders written for a PRAFO to be worn at all times on the right lower extremity to unload the pressure on the right heel.

## 2012-12-05 NOTE — Progress Notes (Signed)
[facility] PROGRESS NOTE  [name]. [id number] DOB: 11-18-1945 DOA: 11/26/2012 PCP: [name], MD  Assessment/Plan:  Acute respiratory failure with hypoxia due to:  A) Bilateral pneumonia  -improved and stable on RA  -cont empiric anbx's. Meropenem day 7 vancomycin day 11. Will continue with Vancomycin for foot infection. -SLP therapy;  recommend regular thin diet.   B) Acute systolic heart failure/EF [id number] Grade 1 diastolic dysfunction  -appears new,  Cards consulted.  -no prior ECHO so unclear if this is acute (due to stressors of sepsis/anemia) or progression of chronic HF - -? Respiratory failure requiring intubation likely combo PNA and CHF  -Lasix started 9-18.   DM2 (diabetes mellitus, type 2) with hypoglycemia  -CBG's more stable  -Will continue with Lantus, decrease to 18 units , CBG this morning at 75.  -cont SSI  -hypoglycemia pre admit suspected due to poor intake in setting of aggressive glycemic control regimen.   Encephalopathy acute  -resolved.    Seizure disorder  -no evidence of Sz activity.  -Dr Francene Boyers lengthy d/w pt re R/B of Dilantin vs Keppra and plan is to DC Dilantin in favor of Keppra   CKD (chronic kidney disease) stage 3, GFR 30-59 ml/min  -cr stable at 1.4. Stable. Monitor on ACE and low dose lasix.   Right foot ulcer/Charcot's joint of foot // right foot infection.  Was on IV vancomycin and ciprofloxacin. Suppose to be on this medications for 4-6  Weeks. (Counting from 8-12)  Will continue with vancomycin. unna boot ordered.  Appreciate Dr [name] help.   Hypertension  -BP controlled-Lopressor resumed 9/14   Tachycardia  -resolved after hydration   Anemia  -Hgb stable  Code Status: Full Code.  Family Communication: care discussed with wife.  Disposition Plan: home when stable.    Consultants: Ortho  PCCM   Procedures: Lower extremity venous duplex  - No evidence of deep vein or superficial  thrombosis involving the right lower extremity and left lower extremity. - No evidence of Baker's cyst on the right or left.  2-D echocardiogram  - Left ventricle: The cavity size was mildly dilated. Wall thickness was normal. Systolic function was severely reduced. The estimated ejection fraction was in the range of 30% to 35%. Doppler parameters are consistent with abnormal left ventricular relaxation (grade 1 diastolic dysfunction). Doppler parameters are consistent with elevated mean left atrial filling pressure. There was a false tendon. . - Regional wall motion abnormality: Moderate hypokinesis of the mid anterior and basal-mid anteroseptal myocardium; mild hypokinesis of the apical anterior and basal inferior myocardium. - Mitral valve: Mild regurgitation. - Left atrium: The atrium was severely dilated. - Atrial septum: No defect or patent foramen ovale was identified. - Pulmonary arteries: PA peak pressure: 9mm Hg (S). - Pericardium, extracardiac: There was a left pleural effusion.    Antibiotics: Meropenem 9/9>>>  Vanco 9/9>>>   HPI/Subjective: No complaints, feeling well.   Objective: Filed Vitals:   12/05/12 0833  BP: 138/68  Pulse: 99  Temp:   Resp:     Intake/Output Summary (Last 24 hours) at 12/05/12 0836 Last data filed at 12/04/12 0900  Gross per 24 hour  Intake    240 ml  Output      0 ml  Net    240 ml   Filed Weights   11/27/12 2009 11/28/12 2314 12/01/12 0500  Weight: 89.999 kg (198 lb 6.6 oz) 91.67 kg (202 lb 1.5 oz) 92.1 kg (203 lb 0.7 oz)  Exam:   General:  No acute distress.   Cardiovascular: S 1, S 2 RRR  Respiratory: CTA  Abdomen: Bs present, soft, nt, nd  Musculoskeletal: right foot with dressing.   Data Reviewed: Basic Metabolic Panel:  Recent Labs Lab 12/01/12 0500 12/02/12 0630 12/03/12 0500 12/04/12 0530 12/05/12 0525  NA 139 140 140 137 136  K 3.6 3.8 3.9 3.7 3.9  CL 108 109 109 107 107  CO2 20 21 21  21 20   GLUCOSE 111* 204* 183* 79 101*  BUN 17 20 21 20 21   CREATININE 1.38* 1.53* 1.51* 1.50* 1.45*  CALCIUM 8.3* 8.2* 7.7* 7.8* 8.1*   Liver Function Tests:  Recent Labs Lab 11/29/12 0500 11/29/12 1740 11/30/12 0353 12/02/12 0630 12/04/12 0530  AST  --  19  --   --  24  ALT  --  13  --   --  16  ALKPHOS  --  111  --   --  92  BILITOT  --  0.1*  --   --  0.1*  PROT  --  6.3  --   --  5.3*  ALBUMIN 1.7* 1.7* 1.7* 1.6* 1.5*   No results found for this basename: LIPASE, AMYLASE,  in the last 168 hours No results found for this basename: AMMONIA,  in the last 168 hours CBC:  Recent Labs Lab 11/29/12 0500 11/29/12 1740 11/30/12 0353 12/01/12 0500 12/02/12 0630  WBC 13.6* 13.2* 10.9* 8.8 10.9*  NEUTROABS  --  10.8*  --   --   --   HGB 9.2* 9.2* 9.2* 10.4* 10.1*  HCT 27.9* 29.3* 29.2* 32.1* 31.0*  MCV 88.3 89.9 89.3 87.2 86.6  PLT 439* 431* 383 315 420*   Cardiac Enzymes:  Recent Labs Lab 11/29/12 1740 11/29/12 2331 11/30/12 0353  TROPONINI <0.30 <0.30 <0.30   BNP (last 3 results)  Recent Labs  11/17/12 0016 11/29/12 1740  PROBNP 4406.0* 7199.0*   CBG:  Recent Labs Lab 12/04/12 0820 12/04/12 1135 12/04/12 1521 12/04/12 2022 12/05/12 0758  GLUCAP 73 124* 108* 154* 76    Recent Results (from the past 240 hour(s))  CULTURE, BLOOD (ROUTINE X 2)     Status: None   Collection Time    11/26/12 12:15 PM      Result Value Range Status   Specimen Description BLOOD RIGHT PICC LINE   Final   Special Requests BOTTLES DRAWN AEROBIC ONLY 10CC   Final   Culture  Setup Time     Final   Value: 11/26/2012 16:23     Performed at [facility]   Culture     Final   Value: NO GROWTH 5 DAYS     Performed at [facility]   Report Status 12/02/2012 FINAL   Final  CULTURE, BLOOD (ROUTINE X 2)     Status: None   Collection Time    11/26/12 12:20 PM      Result Value Range Status   Specimen Description BLOOD LEFT HAND   Final   Special Requests  BOTTLES DRAWN AEROBIC ONLY 5CC   Final   Culture  Setup Time     Final   Value: 11/26/2012 16:23     Performed at [facility]   Culture     Final   Value: NO GROWTH 5 DAYS     Performed at [facility]   Report Status 12/02/2012 FINAL   Final  URINE CULTURE     Status: None  Collection Time    11/26/12  3:19 PM      Result Value Range Status   Specimen Description URINE, RANDOM   Final   Special Requests NONE   Final   Culture  Setup Time     Final   Value: 11/26/2012 16:16     Performed at [place]     Final   Value: 3,000 COLONIES/ML     Performed at [facility]   Culture     Final   Value: INSIGNIFICANT GROWTH     Performed at [facility]   Report Status 11/27/2012 FINAL   Final  MRSA PCR SCREENING     Status: None   Collection Time    11/29/12  5:22 PM      Result Value Range Status   MRSA by PCR NEGATIVE  NEGATIVE Final   Comment:            The GeneXpert MRSA Assay (FDA     approved for NASAL specimens     only), is one component of a     comprehensive MRSA colonization     surveillance program. It is not     intended to diagnose MRSA     infection nor to guide or     monitor treatment for     MRSA infections.     Studies: No results found.  Scheduled Meds: . aspirin  325 mg Oral Daily  . atorvastatin  40 mg Oral q1800  . carvedilol  6.25 mg Oral BID WC  . furosemide  10 mg Oral Daily  . guaiFENesin  600 mg Oral BID  . heparin  5,000 Units Subcutaneous Q8H  . insulin aspart  0-15 Units Subcutaneous TID WC  . insulin glargine  20 Units Subcutaneous QHS  . latanoprost  1 drop Both Eyes QHS  . levETIRAcetam  500 mg Oral BID  . losartan  25 mg Oral Daily  . meropenem (MERREM) IV  1 g Intravenous Q12H  . silver sulfADIAZINE   Topical Daily  . tamsulosin  0.4 mg Oral QPC supper  . timolol  1 drop Both Eyes Daily  . vancomycin  1,000 mg Intravenous Q24H   Continuous Infusions: . sodium  chloride 500 mL (12/04/12 1015)    Active Problems:   DM2 (diabetes mellitus, type 2)   Right foot ulcer   Hypertension   Anemia   Bilateral pneumonia   Acute respiratory failure with hypoxia   Encephalopathy acute   CKD (chronic kidney disease) stage 3, GFR 30-59 ml/min   Charcot's joint of foot   Acute systolic heart failure-EF 35%    Chronic diastolic heart failure    Time spent: 35 minutes.     Gedalya Jim  [facility] Pager [phone]. If 7PM-7AM, please contact night-coverage at [email or web address], password [facility] 12/05/2012, 8:36 AM  LOS: 9 days

## 2012-12-06 ENCOUNTER — Inpatient Hospital Stay (HOSPITAL_COMMUNITY): Payer: Medicare Other

## 2012-12-06 LAB — BASIC METABOLIC PANEL
BUN: 19 mg/dL (ref 6–23)
CO2: 21 mEq/L (ref 19–32)
Calcium: 8.1 mg/dL — ABNORMAL LOW (ref 8.4–10.5)
Creatinine, Ser: 1.37 mg/dL — ABNORMAL HIGH (ref 0.50–1.35)
GFR calc non Af Amer: 53 mL/min — ABNORMAL LOW (ref >90)
Glucose, Bld: 96 mg/dL (ref 70–99)
Sodium: 140 mEq/L (ref 135–145)

## 2012-12-06 LAB — GLUCOSE, CAPILLARY
Glucose-Capillary: 124 mg/dL — ABNORMAL HIGH (ref 70–99)
Glucose-Capillary: 99 mg/dL (ref 70–99)

## 2012-12-06 MED ORDER — INSULIN GLARGINE 100 UNIT/ML ~~LOC~~ SOLN
17.0000 [IU] | Freq: Every day | SUBCUTANEOUS | Status: DC
  Administered 2012-12-06: 17 [IU] via SUBCUTANEOUS
  Filled 2012-12-06 (×2): qty 0.17

## 2012-12-06 MED ORDER — CARVEDILOL 6.25 MG PO TABS: 6.2500 mg | ORAL_TABLET | Freq: Two times a day (BID) | ORAL | Status: AC

## 2012-12-06 MED ORDER — HEPARIN SOD (PORK) LOCK FLUSH 100 UNIT/ML IV SOLN
250.0000 [IU] | INTRAVENOUS | Status: DC | PRN
  Filled 2012-12-06: qty 3

## 2012-12-06 MED ORDER — FUROSEMIDE 20 MG PO TABS: 10.0000 mg | ORAL_TABLET | Freq: Every day | ORAL | Status: DC

## 2012-12-06 MED ORDER — LOSARTAN POTASSIUM 25 MG PO TABS: 25.0000 mg | ORAL_TABLET | Freq: Every day | ORAL | Status: DC

## 2012-12-06 MED ORDER — CIPROFLOXACIN HCL 250 MG PO TABS: 250.0000 mg | ORAL_TABLET | Freq: Two times a day (BID) | ORAL | Status: AC

## 2012-12-06 MED ORDER — HEPARIN SOD (PORK) LOCK FLUSH 100 UNIT/ML IV SOLN
250.0000 [IU] | Freq: Every day | INTRAVENOUS | Status: DC
  Filled 2012-12-06 (×2): qty 3

## 2012-12-06 MED ORDER — ALTEPLASE 2 MG IJ SOLR
2.0000 mg | Freq: Once | INTRAMUSCULAR | Status: AC
  Administered 2012-12-06: 2 mg
  Filled 2012-12-06 (×2): qty 2

## 2012-12-06 MED ORDER — LEVETIRACETAM 500 MG PO TABS: 500.0000 mg | ORAL_TABLET | Freq: Two times a day (BID) | ORAL | Status: DC

## 2012-12-06 MED ORDER — GUAIFENESIN ER 600 MG PO TB12: 600.0000 mg | ORAL_TABLET | Freq: Two times a day (BID) | ORAL | Status: DC

## 2012-12-06 MED ORDER — INSULIN GLARGINE 100 UNIT/ML ~~LOC~~ SOLN: 17.0000 [IU] | Freq: Every day | SUBCUTANEOUS | Status: DC

## 2012-12-06 MED ORDER — ALTEPLASE 2 MG IJ SOLR
2.0000 mg | Freq: Once | INTRAMUSCULAR | Status: AC
  Administered 2012-12-06: 2 mg
  Filled 2012-12-06: qty 2

## 2012-12-06 MED ORDER — VANCOMYCIN HCL IN DEXTROSE 1-5 GM/200ML-% IV SOLN: 1000.0000 mg | INTRAVENOUS | Status: DC

## 2012-12-06 MED ORDER — CIPROFLOXACIN HCL 250 MG PO TABS
250.0000 mg | ORAL_TABLET | Freq: Two times a day (BID) | ORAL | Status: DC
  Administered 2012-12-06 – 2012-12-07 (×3): 250 mg via ORAL
  Filled 2012-12-06 (×5): qty 1

## 2012-12-06 NOTE — Progress Notes (Signed)
PT Cancellation Note  Patient Details Name: [name]. MRN: [id number] DOB: Jun 23, 1945   Cancelled Treatment:    Reason Eval/Treat Not Completed: Other (comment) (9/13 Dr.Duda stated pt WBAT with latest note from Dr.Duda 9/18 for pt to wear PRAFO on RLE at all times. Called office and await clarification if pt still allowed to weight bear on RLE prior to tx )   Melford Aase 12/06/2012, 10:57 AM Elwyn Reach, [place]

## 2012-12-06 NOTE — Progress Notes (Signed)
ANTIBIOTIC CONSULT NOTE - FOLLOW UP  Pharmacy Consult for vancomycin Indication: PNA + left toe ulcer  Allergies  Allergen Reactions  . Codeine Anaphylaxis  . Penicillins Anaphylaxis    Patient Measurements: Height: 5' 10.08" (178 cm) Weight: 203 lb 0.7 oz (92.1 kg) IBW/kg (Calculated) : 73.18   Vital Signs: Temp: 99 F (37.2 C) (09/19 0646) Temp src: Oral (09/19 0646) BP: 144/60 mmHg (09/19 1006) Pulse Rate: 90 (09/19 1006) Intake/Output from previous day: 09/18 0701 - 09/19 0700 In: 960 [P.O.:960] Out: 625 [Urine:625] Intake/Output from this shift:    Labs:  Recent Labs  12/04/12 0530 12/05/12 0525 12/06/12 0600  CREATININE 1.50* 1.45* 1.37*   Estimated Creatinine Clearance: 61.4 ml/min (by C-G formula based on Cr of 1.37). No results found for this basename: VANCOTROUGH, Corlis Leak, VANCORANDOM, GENTTROUGH, GENTPEAK, GENTRANDOM, Ross, TOBRAPEAK, TOBRARND, AMIKACINPEAK, AMIKACINTROU, AMIKACIN,  in the last Devin hours   Microbiology: Recent Results (from the past 720 hour(s))  CULTURE, BLOOD (ROUTINE X 2)     Status: None   Collection Time    11/17/12 12:40 AM      Result Value Range Status   Specimen Description BLOOD LEFT ARM   Final   Special Requests BOTTLES DRAWN AEROBIC ONLY [place]   Final   Culture  Setup Time     Final   Value: 11/17/2012 04:24     Performed at [facility]   Culture     Final   Value: NO GROWTH 5 DAYS     Performed at [facility]   Report Status 11/23/2012 FINAL   Final  CULTURE, BLOOD (ROUTINE X 2)     Status: None   Collection Time    11/17/12  2:58 AM      Result Value Range Status   Specimen Description BLOOD LEFT ARM   Final   Special Requests BOTTLES DRAWN AEROBIC ONLY [facility]   Final   Culture  Setup Time     Final   Value: 11/17/2012 16:34     Performed at [facility]   Culture     Final   Value: NO GROWTH 5 DAYS     Performed at [facility]   Report Status 11/23/2012 FINAL   Final   MRSA PCR SCREENING     Status: None   Collection Time    11/17/12  5:00 AM      Result Value Range Status   MRSA by PCR NEGATIVE  NEGATIVE Final   Comment:            The GeneXpert MRSA Assay (FDA     approved for NASAL specimens     only), is one component of a     comprehensive MRSA colonization     surveillance program. It is not     intended to diagnose MRSA     infection nor to guide or     monitor treatment for     MRSA infections.  CULTURE, BLOOD (ROUTINE X 2)     Status: None   Collection Time    11/26/12 12:15 PM      Result Value Range Status   Specimen Description BLOOD RIGHT PICC LINE   Final   Special Requests BOTTLES DRAWN AEROBIC ONLY 10CC   Final   Culture  Setup Time     Final   Value: 11/26/2012 16:23     Performed at [facility]   Culture     Final   Value:  NO GROWTH 5 DAYS     Performed at [facility]   Report Status 12/02/2012 FINAL   Final  CULTURE, BLOOD (ROUTINE X 2)     Status: None   Collection Time    11/26/12 12:20 PM      Result Value Range Status   Specimen Description BLOOD LEFT HAND   Final   Special Requests BOTTLES DRAWN AEROBIC ONLY 5CC   Final   Culture  Setup Time     Final   Value: 11/26/2012 16:23     Performed at [facility]   Culture     Final   Value: NO GROWTH 5 DAYS     Performed at [facility]   Report Status 12/02/2012 FINAL   Final  URINE CULTURE     Status: None   Collection Time    11/26/12  3:19 PM      Result Value Range Status   Specimen Description URINE, RANDOM   Final   Special Requests NONE   Final   Culture  Setup Time     Final   Value: 11/26/2012 16:16     Performed at [place]     Final   Value: 3,000 COLONIES/ML     Performed at [facility]   Culture     Final   Value: INSIGNIFICANT GROWTH     Performed at [facility]   Report Status 11/27/2012 FINAL   Final  MRSA PCR SCREENING     Status: None   Collection Time     11/29/12  5:22 PM      Result Value Range Status   MRSA by PCR NEGATIVE  NEGATIVE Final   Comment:            The GeneXpert MRSA Assay (FDA     approved for NASAL specimens     only), is one component of a     comprehensive MRSA colonization     surveillance program. It is not     intended to diagnose MRSA     infection nor to guide or     monitor treatment for     MRSA infections.    Anti-infectives   Start     Dose/Rate Route Frequency Ordered Stop   12/06/12 1100  ciprofloxacin (CIPRO) tablet 250 mg     250 mg Oral 2 times daily 12/06/12 1001     12/06/12 0000  ciprofloxacin (CIPRO) 250 MG tablet     250 mg Oral 2 times daily 12/06/12 1029 01/03/13 2359   12/06/12 0000  vancomycin (VANCOCIN) 1 GM/200ML SOLN    Comments:  Until 9-27, needs to follow up with DR [name] for further recommendation.   1,000 mg 200 mL/hr over 60 Minutes Intravenous Every 24 hours 12/06/12 1029     11/30/12 2200  meropenem (MERREM) 1 g in sodium chloride 0.9 % 100 mL IVPB  Status:  Discontinued     1 g 200 mL/hr over 30 Minutes Intravenous Every 12 hours 11/30/12 1604 12/06/12 1000   11/29/12 1700  meropenem (MERREM) 1 g in sodium chloride 0.9 % 100 mL IVPB  Status:  Discontinued     1 g 200 mL/hr over 30 Minutes Intravenous Every 12 hours 11/29/12 1542 11/29/12 1602   11/29/12 1700  meropenem (MERREM) 1 g in sodium chloride 0.9 % 100 mL IVPB  Status:  Discontinued     1 g 200 mL/hr over 30  Minutes Intravenous 3 times per day 11/29/12 1602 11/30/12 1604   11/27/12 1515  vancomycin (VANCOCIN) IVPB 1000 mg/200 mL premix     1,000 mg 200 mL/hr over 60 Minutes Intravenous Every 24 hours 11/26/12 1601     11/26/12 1600  aztreonam (AZACTAM) 2 g in dextrose 5 % 50 mL IVPB  Status:  Discontinued     2 g 100 mL/hr over 30 Minutes Intravenous Every 8 hours 11/26/12 1536 11/29/12 1541   11/26/12 1445  imipenem-cilastatin (PRIMAXIN) 500 mg in sodium chloride 0.9 % 100 mL IVPB     500 mg 200 mL/hr over 30  Minutes Intravenous To Emergency Dept 11/26/12 1429 11/26/12 1729   11/26/12 1430  vancomycin (VANCOCIN) IVPB 1000 mg/200 mL premix     1,000 mg 200 mL/hr over 60 Minutes Intravenous  Once 11/26/12 1429 11/26/12 1613     Assessment: [name] continues on vancomycin for PNA + left toe ulcer. Plan is for 6 weeks with starting date of 8/12, which would make the stop date for vancomycin 9/22. A VT obtained on 9/13 was therapeutic on 1000mg  IV Q24 and since SCr has been stable, dose has not been changed.  Goal of Therapy:  Vancomycin trough level 15-20 mcg/ml  Plan:  1. Continue vancomycin 1000mg  IV Q24h 2. Note patient is likely to go home today, stop date of antibiotics should be 9/22  Mirjana Tarleton D. Shameeka Silliman, PharmD Clinical Pharmacist Pager: [phone] 12/06/2012 1:49 PM

## 2012-12-06 NOTE — Discharge Summary (Signed)
Physician Discharge Summary  [name]. [id number] DOB: 1945/09/17 DOA: 11/26/2012  PCP: [name], MD  Admit date: 11/26/2012 Discharge date: 12/06/2012  Time spent: 35 minutes  Recommendations for Outpatient Follow-up:  1. Need B-met to follow renal function.  2. Needs to follow up with Dr [name] to determine stop date for antibiotics and follow up on foot infection.  3. Needs to follow up with cardiologist for further evaluation of heart failure.  4.   Discharge Diagnoses:    Bilateral pneumonia   Acute respiratory failure with hypoxia   Encephalopathy acute   Hypoglycemia   DM2 (diabetes mellitus, type 2)   Right foot ulcer   Hypertension   Anemia   CKD (chronic kidney disease) stage 3, GFR 30-59 ml/min   Charcot's joint of foot   Acute systolic heart failure-EF 35%    Chronic diastolic heart failure   Discharge Condition: Stable  Diet recommendation: Heart Healthy.   Filed Weights   11/27/12 2009 11/28/12 2314 12/01/12 0500  Weight: 89.999 kg (198 lb 6.6 oz) 91.67 kg (202 lb 1.5 oz) 92.1 kg (203 lb 0.7 oz)    History of present illness:  Who was recently discharged from the hospital for PNA. See discharge summary dated yesterday (11/25/12). Since discharge, the patient continued on his 70/30 and lantus insulins. He was noted to be profoundly hypoglycemic on the morning of admit with bs as low as the 20's. The patient received glucagon and orange juice with improvement of blood sugars. He was brought to the ED with improved BS. However, the patient was also found to be mildly hypothermic. The hospitalist service was consulted for admission   Hospital Course:  Patient stable to be discharge. Multiples appointment arrange.   Acute respiratory failure with hypoxia due to:  A) Bilateral pneumonia: unclear if aspiration or healthcare associated.  -improved and stable on RA  -Received Meropenem day 7 vancomycin day 11. Will continue with Vancomycin for foot  infection.  -SLP therapy; recommend regular thin diet.  -Chest X ray with improved infiltrates.   B) Acute systolic heart failure/EF [id number] Grade 1 diastolic dysfunction  -appears new, Cards consulted.  -no prior ECHO so unclear if this is acute (due to stressors of sepsis/anemia) or progression of chronic HF -  -? Respiratory failure requiring intubation likely combo PNA and CHF  -Lasix started 9-18.   DM2 (diabetes mellitus, type 2) with hypoglycemia  -CBG's more stable  -Will continue with Lantus, decrease to 17 units , CBG this morning at 75.  -cont SSI  -hypoglycemia pre admit suspected due to poor intake in setting of aggressive glycemic control regimen.   Encephalopathy acute  -resolved.  Seizure disorder  -no evidence of Sz activity.  -Dr Francene Boyers lengthy d/w pt re R/B of Dilantin vs Keppra and plan is to DC Dilantin in favor of Keppra   CKD (chronic kidney disease) stage 3, GFR 30-59 ml/min  -cr stable at 1.4. Stable. Monitor on ACE and low dose lasix.  Renal function stable on low dose lasix.   Right foot ulcer/Charcot's joint of foot // right foot infection.  Was on IV vancomycin and ciprofloxacin. Suppose to be on this medications for 4-6 Weeks. (Counting from 8-12)  Will continue with vancomycin. unna boot ordered.  Appreciate Dr [name] help.  Discussed with Dr Ninfa Linden ok to discharge patient with unna boot and to follow up next week at the office.   Hypertension  -BP controlled-Lopressor resumed 9/14   Tachycardia  -resolved  after hydration   Anemia  -Hgb stable   Procedures: Lower extremity venous duplex  - No evidence of deep vein or superficial thrombosis involving the right lower extremity and left lower extremity. - No evidence of Baker's cyst on the right or left.  2-D echocardiogram  - Left ventricle: The cavity size was mildly dilated. Wall thickness was normal. Systolic function was severely reduced. The estimated ejection fraction was in the  range of 30% to 35%. Doppler parameters are consistent with abnormal left ventricular relaxation (grade 1 diastolic dysfunction). Doppler parameters are consistent with elevated mean left atrial filling pressure. There was a false tendon. . - Regional wall motion abnormality: Moderate hypokinesis of the mid anterior and basal-mid anteroseptal myocardium; mild hypokinesis of the apical anterior and basal inferior myocardium. - Mitral valve: Mild regurgitation. - Left atrium: The atrium was severely dilated. - Atrial septum: No defect or patent foramen ovale was identified. - Pulmonary arteries: PA peak pressure: 25mm Hg (S). - Pericardium, extracardiac: There was a left pleural effusion.      Consultations:  Dr [name]  Dr Flonnie Overman  Discharge Exam: Filed Vitals:   12/06/12 1006  BP: 144/60  Pulse: 90  Temp:   Resp:     General: No distress.  Cardiovascular: S 1, S 2 RRR Respiratory: CTA  Discharge Instructions  Discharge Orders   Future Appointments Provider Department Dept Phone   01/22/2013 3:15 PM Josue Hector, MD [facility]) [phone]   Future Orders Complete By Expires   Diet - low sodium heart healthy  As directed    Increase activity slowly  As directed        Medication List    STOP taking these medications       amLODipine 5 MG tablet  Commonly known as:  NORVASC     insulin aspart protamine- aspart (70-30) 100 UNIT/ML injection  Commonly known as:  NOVOLOG MIX 70/30     lisinopril 10 MG tablet  Commonly known as:  PRINIVIL,ZESTRIL     metoprolol 50 MG tablet  Commonly known as:  LOPRESSOR     phenytoin 100 MG ER capsule  Commonly known as:  DILANTIN     traMADol 50 MG tablet  Commonly known as:  ULTRAM     VANCOMYCIN HCL IV      TAKE these medications       aspirin 325 MG tablet  Take 325 mg by mouth daily.     atorvastatin 20 MG tablet  Commonly known as:  LIPITOR  Take 20 mg by mouth daily.      carvedilol 6.25 MG tablet  Commonly known as:  COREG  Take 1 tablet (6.25 mg total) by mouth 2 (two) times daily with a meal.     ciprofloxacin 250 MG tablet  Commonly known as:  CIPRO  Take 1 tablet (250 mg total) by mouth 2 (two) times daily.     furosemide 20 MG tablet  Commonly known as:  LASIX  Take 0.5 tablets (10 mg total) by mouth daily.     guaiFENesin 600 MG 12 hr tablet  Commonly known as:  MUCINEX  Take 1 tablet (600 mg total) by mouth 2 (two) times daily.     insulin glargine 100 UNIT/ML injection  Commonly known as:  LANTUS  Inject 0.17 mLs (17 Units total) into the skin at bedtime.     levETIRAcetam 500 MG tablet  Commonly known as:  KEPPRA  Take 1 tablet (500  mg total) by mouth 2 (two) times daily.     losartan 25 MG tablet  Commonly known as:  COZAAR  Take 1 tablet (25 mg total) by mouth daily.     polyethylene glycol packet  Commonly known as:  MIRALAX / GLYCOLAX  Take 17 g by mouth daily as needed (constipation).     senna-docusate 8.6-50 MG per tablet  Commonly known as:  Senokot-S  Take 1 tablet by mouth daily.     silver sulfADIAZINE 1 % cream  Commonly known as:  SILVADENE  Apply topically daily.     sodium chloride 0.9 % injection  10-40 mL by Intracatheter route as needed (flush).     tamsulosin 0.4 MG Caps capsule  Commonly known as:  FLOMAX  Take 1 capsule (0.4 mg total) by mouth daily after supper.     timolol 0.5 % ophthalmic solution  Commonly known as:  BETIMOL  Place 1 drop into both eyes every morning.     Travoprost (BAK Free) 0.004 % Soln ophthalmic solution  Commonly known as:  [name]  Place 1 drop into both eyes at bedtime.     vancomycin 1 GM/200ML Soln  Commonly known as:  VANCOCIN  Inject 200 mLs (1,000 mg total) into the vein daily.       Allergies  Allergen Reactions  . Codeine Anaphylaxis  . Penicillins Anaphylaxis       Follow-up Information   Follow up with [name], MD In 1 week.    Specialty:  Endocrinology   Contact information:   [address] [place] [address] [phone]       Follow up with Newt Minion, MD.   Specialty:  Orthopedic Surgery   Contact information:   [place] [address] [phone]       Follow up with [name], MD. Call in 2 weeks.   Specialty:  Cardiology   Contact information:   [id number] [place] [address] [phone]        The results of significant diagnostics from this hospitalization (including imaging, microbiology, ancillary and laboratory) are listed below for reference.    Significant Diagnostic Studies: Ct Abdomen Pelvis Wo Contrast  11/29/2012   CLINICAL DATA:  Abdominal distention, nausea, vomiting. Altered mental status.  EXAM: CT CHEST, ABDOMEN AND PELVIS WITHOUT CONTRAST  TECHNIQUE: Multidetector CT imaging of the chest, abdomen and pelvis was performed following the standard protocol without IV contrast.  COMPARISON:  None.  FINDINGS: CT CHEST FINDINGS  There are large bilateral pleural effusions. Compressive atelectasis in the lower lobes. Airspace opacities in both upper lobes, right greater than left. This concerning for pneumonia. Heart is mildly enlarged. Small pericardial effusion. Aorta is normal caliber. Small scattered mediastinal lymph nodes, none pathologically enlarged. No axillary adenopathy.  Tracheostomy tube tip is in the lower trachea above the carina. NG tube enters the stomach.  No acute bony abnormality.  Multiple old right rib fractures.  CT ABDOMEN AND PELVIS FINDINGS  As mentioned, NG tube enters the stomach. Large stool burden throughout the colon, particularly the right colon and transverse colon. Small bowel is decompressed.  Liver, spleen, pancreas, adrenals and kidneys have a grossly unremarkable unenhanced appearance. Aorta and iliac vessels are normal caliber. Small left inguinal hernia containing fat. Urinary bladder is  grossly unremarkable. Prostate mildly prominent. No free fluid, free air or adenopathy.  Edema/stranding throughout the subcutaneous soft tissues of the abdomen, pelvis and upper aspect of  the lower extremities. No acute bony abnormality. Apparent old healed trauma to the right hemipelvis.  IMPRESSION: CT CHEST IMPRESSION  Large bilateral pleural effusions with compressive atelectasis in both lungs. Airspace disease in the upper lobes, right greater the left concerning for pneumonia.  Mild cardiomegaly. Small pericardial effusion.  CT ABDOMEN AND PELVIS IMPRESSION  Large stool burden in the right colon and transverse colon.  No definite acute process in the abdomen or pelvis.   Electronically Signed   By: Rolm Baptise M.D.   On: 11/29/2012 22:17   Dg Chest 2 View  11/29/2012   *RADIOLOGY REPORT*  Clinical Data: Shortness of breath  CHEST - 2 VIEW  Comparison: 11/26/2012  Findings: Cardiac shadow is stable.  A right-sided central venous line is again identified and stable.  The lungs again demonstrate patchy infiltrates bilaterally.  These are slightly more prominent than that seen on the prior exam.  These are most notable in the apices.  IMPRESSION: Slight increase in degree of bilateral infiltrates when compared with the prior exam.   Original Report Authenticated By: Inez Catalina, M.D.   Dg Chest 2 View  11/17/2012   CLINICAL DATA:  Fever, upper back pain.  EXAM: CHEST  2 VIEW  COMPARISON:  10/10/2011  FINDINGS: Patchy bilateral airspace opacities are noted, most pronounced in the upper lobes. Findings concerning for multifocal pneumonia. Heart is borderline in size. Small bilateral pleural effusions. No acute bony abnormality.  IMPRESSION: Patchy multifocal bilateral airspace disease concerning for multifocal pneumonia. Small bilateral effusions.   Electronically Signed   By: Rolm Baptise   On: 11/17/2012 01:51   Dg Tibia/fibula Right  11/29/2012   CLINICAL DATA:  Right lower leg swelling. [name]  EXAM: RIGHT  TIBIA AND FIBULA - 2 VIEW  COMPARISON:  06/30/2009.  FINDINGS: Extensive lower extremity soft tissue swelling, nonspecific. There is new fragmentation of the medial malleolus, with indistinct margins. New, but chronic appearing, periosteal changes to the lateral cortex lower tibia. Extensive demineralization of the midfoot. No acute fracture. Extensive vascular calcification.  IMPRESSION: New (since 2011) erosion/ fragmentation of the medial malleolus is concerning for osseous infection given ipsilateral foot osteomyelitis/abscess.   Electronically Signed   By: Jorje Guild   On: 11/29/2012 22:59   Ct Head Wo Contrast  11/29/2012   CLINICAL DATA:  Altered mental status.  EXAM: CT HEAD WITHOUT CONTRAST  TECHNIQUE: Contiguous axial images were obtained from the base of the skull through the vertex without intravenous contrast.  COMPARISON:  MRI 09/30/2010  FINDINGS: Chronic microvascular changes throughout the deep white matter. Old left occipital infarct. No acute intracranial abnormality. Specifically, no hemorrhage, hydrocephalus, mass lesion, acute infarction, or significant intracranial injury. No acute calvarial abnormality. Visualized paranasal sinuses and mastoids clear. Orbital soft tissues unremarkable.  IMPRESSION: Old left occipital infarct. Chronic small vessel disease changes. No acute findings or change.   Electronically Signed   By: Rolm Baptise M.D.   On: 11/29/2012 22:10   Ct Chest Wo Contrast  11/29/2012   CLINICAL DATA:  Abdominal distention, nausea, vomiting. Altered mental status.  EXAM: CT CHEST, ABDOMEN AND PELVIS WITHOUT CONTRAST  TECHNIQUE: Multidetector CT imaging of the chest, abdomen and pelvis was performed following the standard protocol without IV contrast.  COMPARISON:  None.  FINDINGS: CT CHEST FINDINGS  There are large bilateral pleural effusions. Compressive atelectasis in the lower lobes. Airspace opacities in both upper lobes, right greater than left. This concerning for  pneumonia. Heart is mildly enlarged. Small pericardial  effusion. Aorta is normal caliber. Small scattered mediastinal lymph nodes, none pathologically enlarged. No axillary adenopathy.  Tracheostomy tube tip is in the lower trachea above the carina. NG tube enters the stomach.  No acute bony abnormality.  Multiple old right rib fractures.  CT ABDOMEN AND PELVIS FINDINGS  As mentioned, NG tube enters the stomach. Large stool burden throughout the colon, particularly the right colon and transverse colon. Small bowel is decompressed.  Liver, spleen, pancreas, adrenals and kidneys have a grossly unremarkable unenhanced appearance. Aorta and iliac vessels are normal caliber. Small left inguinal hernia containing fat. Urinary bladder is grossly unremarkable. Prostate mildly prominent. No free fluid, free air or adenopathy.  Edema/stranding throughout the subcutaneous soft tissues of the abdomen, pelvis and upper aspect of the lower extremities. No acute bony abnormality. Apparent old healed trauma to the right hemipelvis.  IMPRESSION: CT CHEST IMPRESSION  Large bilateral pleural effusions with compressive atelectasis in both lungs. Airspace disease in the upper lobes, right greater the left concerning for pneumonia.  Mild cardiomegaly. Small pericardial effusion.  CT ABDOMEN AND PELVIS IMPRESSION  Large stool burden in the right colon and transverse colon.  No definite acute process in the abdomen or pelvis.   Electronically Signed   By: Rolm Baptise M.D.   On: 11/29/2012 22:17   Mr Foot Right W Wo Contrast  11/18/2012   *RADIOLOGY REPORT*  Clinical Data: Osteomyelitis.  Foot ulcers.  Recent incision and drainage.  MRI OF THE RIGHT FOREFOOT WITHOUT AND WITH CONTRAST  Technique:  Multiplanar, multisequence MR imaging was performed both before and after administration of intravenous contrast.  Contrast: 78mL MULTIHANCE GADOBENATE DIMEGLUMINE 529 MG/ML IV SOLN  Comparison: None.  Findings: Transmetatarsal amputations have  been performed at the third and fourth metatarsals.  There appears to have been debridement of the medial foot around the tarsometatarsal junction.  There is heterogeneous material extending and, probably representing wound packing material.  There is nonenhancing fluid around the packing material and the collection in the plantar midfoot measures 4 cm transverse by 2.6 cm plantar to dorsal.  This collection tracks proximally to the level of the talus and also extends into the posteromedial tendon sheaths, mainly the posterior tibial tendon.  Findings are consistent with infectious tenosynovitis.  There is another abscess pocket over the dorsal heel.  Neuropathic changes of the midfoot are present however with proximity of the abscess, osteomyelitis is present as well.  Additionally, there is osteomyelitis of the first metatarsal extending to the first MTP joint. On the post-gadolinium imaging, is abnormal enhancement in the tibial plafond and talar dome and ankle effusion compatible with septic arthritis of the ankle. The draining abscess extends distally along the plantar aspect of the great toe.  IMPRESSION: Complex soft tissue infection and extensive osteomyelitis of the left foot.  The area of incision and drainage communicates with a much larger serpentine abscess that tracks the length of the foot with septic arthritis from the ankle to the 1st MTP joint.   Original Report Authenticated By: Dereck Ligas, M.D.   Dg Chest Port 1 View  12/01/2012   CLINICAL DATA:  67 year old male with confusion, pneumonia.  EXAM: PORTABLE CHEST - 1 VIEW  COMPARISON:  11/30/2012 and earlier.  FINDINGS: Portable AP semi upright view at 0610 hr. The patient is extubated. Enteric tube removed. Stable right PICC line.  Stable lung volumes. Continued veiling bilateral pulmonary opacity. Continued dense retrocardiac opacity. No pneumothorax. Pulmonary vascularity year remains indistinct.  IMPRESSION: 1. Extubated.  Enteric tube  removed.  2. Stable lung volumes with widespread veiling opacity suggesting pleural effusions right greater than left.  3.  Continued lower lobe collapse/consolidation.   Electronically Signed   By: Lars Pinks M.D.   On: 12/01/2012 07:22   Dg Chest Port 1 View  11/30/2012   ADDENDUM REPORT: 11/30/2012 01:27  ADDENDUM: Right PICC line is in place. The tip is in the upper to mid right atrium, approximately 3 cm deep to the cavoatrial junction.   Electronically Signed   By: Rolm Baptise M.D.   On: 11/30/2012 01:27   11/30/2012   CLINICAL DATA:  Line placement.  EXAM: PORTABLE CHEST - 1 VIEW  COMPARISON:  11/29/2012  FINDINGS: Endotracheal tube is 3 cm above the carina. Severe diffuse bilateral airspace disease and layering effusions. No real change since prior study. Heart is normal size. No acute bony abnormality.  IMPRESSION: Endotracheal tube and NG tube in stable position.  Stable diffuse bilateral airspace disease and bilateral effusions.  Electronically Signed: By: Rolm Baptise M.D. On: 11/30/2012 01:06   Dg Chest Port 1 View  11/29/2012   *RADIOLOGY REPORT*  Clinical Data: Endotracheal tube placement.  PORTABLE CHEST - 1 VIEW  Comparison: Same date.  Findings: Endotracheal tube is seen projected over tracheal air shadow with distal tip 2 cm above the carina.  Stable cardiomediastinal silhouette.  Significantly increased bilateral basilar opacities is noted consistent with worsening edema or pneumonia.  Associated pleural effusions are noted.  Right-sided PICC line is noted with tip in expected position of the SVC. Nasogastric tube passes through esophagus into stomach.  IMPRESSION: Endotracheal tube in grossly good position.  Worsening bilateral basilar opacities consistent with edema or possibly pneumonia.   Original Report Authenticated By: Marijo Conception.,  M.D.   Dg Chest Port 1 View  11/26/2012   CLINICAL DATA:  Hypoglycemia, shortness of breath, weakness.  EXAM: PORTABLE CHEST - 1 VIEW  COMPARISON:   11/24/2012  FINDINGS: Patchy bilateral airspace opacities are again noted, minimally improved in the lung bases with increasing lung volumes. No effusions. Heart is normal size. No acute bony abnormality. Right PICC line is unchanged.  IMPRESSION: Patchy right upper lobe and bilateral lower lobe opacities. Slight improvement in aeration in the lung bases.   Electronically Signed   By: Rolm Baptise M.D.   On: 11/26/2012 12:21   Dg Chest Port 1 View  11/24/2012   *RADIOLOGY REPORT*  Clinical Data: Evaluate bilateral pulmonary infiltrates  PORTABLE CHEST - 1 VIEW  Comparison: 11/20/2012; 11/17/2012; 10/10/2011  Findings:  Grossly unchanged borderline enlarged cardiac silhouette and mediastinal contours given reduced lung volumes. Stable positioning of support apparatus. The pulmonary vasculature is less distinct on the present examination with cephalization of flow.  Grossly unchanged right upper lung heterogeneous air space opacities, now with worsening of bilateral medial basilar heterogeneous / consolidative opacity.  No definite pleural effusion or pneumothorax.  Unchanged bones.  IMPRESSION: 1.  Persistent right upper lung heterogeneous air space opacities, now with worsening bilateral lower lung heterogeneous / consolidative opacities, possibly atelectasis though worrisome for progression of multifocal infection.  2.  Suspected mild pulmonary edema.   Original Report Authenticated By: Jake Seats, MD   Dg Chest Port 1 View  11/20/2012   *RADIOLOGY REPORT*  Clinical Data: Follow up bilateral infiltrates  PORTABLE CHEST - 1 VIEW  Comparison: 11/17/12  Findings: The right-sided PICC line is again identified and stable. The cardiac shadow is stable.  Bilateral lateral upper  lobe infiltrates are again seen.  Minimal effusions are noted bilaterally.  IMPRESSION: Stable bilateral infiltrates.   Original Report Authenticated By: Inez Catalina, M.D.   Dg Abd Portable 1v  11/29/2012   CLINICAL DATA:  67 year old male  NG tube placement.  EXAM: PORTABLE ABDOMEN - 1 VIEW  COMPARISON:  None.  FINDINGS: Portable AP supine view at 1904 hrs. Enteric tube courses to the left upper quadrant, tip at the level of the gastric body and side hole to level of the gastric fundus. Visualized bowel gas pattern is non obstructed.  IMPRESSION: NG tube tip at the level of the gastric body.   Electronically Signed   By: Lars Pinks M.D.   On: 11/29/2012 19:13    Microbiology: Recent Results (from the past 240 hour(s))  CULTURE, BLOOD (ROUTINE X 2)     Status: None   Collection Time    11/26/12 12:15 PM      Result Value Range Status   Specimen Description BLOOD RIGHT PICC LINE   Final   Special Requests BOTTLES DRAWN AEROBIC ONLY 10CC   Final   Culture  Setup Time     Final   Value: 11/26/2012 16:23     Performed at [facility]   Culture     Final   Value: NO GROWTH 5 DAYS     Performed at [facility]   Report Status 12/02/2012 FINAL   Final  CULTURE, BLOOD (ROUTINE X 2)     Status: None   Collection Time    11/26/12 12:20 PM      Result Value Range Status   Specimen Description BLOOD LEFT HAND   Final   Special Requests BOTTLES DRAWN AEROBIC ONLY 5CC   Final   Culture  Setup Time     Final   Value: 11/26/2012 16:23     Performed at [facility]   Culture     Final   Value: NO GROWTH 5 DAYS     Performed at [facility]   Report Status 12/02/2012 FINAL   Final  URINE CULTURE     Status: None   Collection Time    11/26/12  3:19 PM      Result Value Range Status   Specimen Description URINE, RANDOM   Final   Special Requests NONE   Final   Culture  Setup Time     Final   Value: 11/26/2012 16:16     Performed at [facility]     Final   Value: 3,000 COLONIES/ML     Performed at [facility]   Culture     Final   Value: INSIGNIFICANT GROWTH     Performed at [facility]   Report Status 11/27/2012 FINAL   Final  MRSA PCR SCREENING      Status: None   Collection Time    11/29/12  5:22 PM      Result Value Range Status   MRSA by PCR NEGATIVE  NEGATIVE Final   Comment:            The GeneXpert MRSA Assay (FDA     approved for NASAL specimens     only), is one component of a     comprehensive MRSA colonization     surveillance program. It is not     intended to diagnose MRSA     infection nor to guide or     monitor treatment for  MRSA infections.     Labs: Basic Metabolic Panel:  Recent Labs Lab 12/02/12 0630 12/03/12 0500 12/04/12 0530 12/05/12 0525 12/06/12 0600  NA 140 140 137 136 140  K 3.8 3.9 3.7 3.9 4.0  CL 109 109 107 107 110  CO2 21 21 21 20 21   GLUCOSE 204* 183* 79 101* 96  BUN 20 21 20 21 19   CREATININE 1.53* 1.51* 1.50* 1.45* 1.37*  CALCIUM 8.2* 7.7* 7.8* 8.1* 8.1*   Liver Function Tests:  Recent Labs Lab 11/29/12 1740 11/30/12 0353 12/02/12 0630 12/04/12 0530  AST 19  --   --  24  ALT 13  --   --  16  ALKPHOS 111  --   --  92  BILITOT 0.1*  --   --  0.1*  PROT 6.3  --   --  5.3*  ALBUMIN 1.7* 1.7* 1.6* 1.5*   No results found for this basename: LIPASE, AMYLASE,  in the last 168 hours No results found for this basename: AMMONIA,  in the last 168 hours CBC:  Recent Labs Lab 11/29/12 1740 11/30/12 0353 12/01/12 0500 12/02/12 0630  WBC 13.2* 10.9* 8.8 10.9*  NEUTROABS 10.8*  --   --   --   HGB 9.2* 9.2* 10.4* 10.1*  HCT 29.3* 29.2* 32.1* 31.0*  MCV 89.9 89.3 87.2 86.6  PLT 431* 383 315 420*   Cardiac Enzymes:  Recent Labs Lab 11/29/12 1740 11/29/12 2331 11/30/12 0353  TROPONINI <0.30 <0.30 <0.30   BNP: BNP (last 3 results)  Recent Labs  11/17/12 0016 11/29/12 1740  PROBNP 4406.0* 7199.0*   CBG:  Recent Labs Lab 12/05/12 0758 12/05/12 1141 12/05/12 1618 12/05/12 2122 12/06/12 0813  GLUCAP 76 130* 139* 161* 75       Signed:  Luis Nickles  [facility] 12/06/2012, 10:30 AM

## 2012-12-06 NOTE — Care Management (Signed)
Case Manager did make pt a f/u appointment with MD Wilson Singer on 12-10-12 at 2:30 pm. No further needs from CM at this time. Bethena Roys, RN,BSN [phone]

## 2012-12-06 NOTE — Progress Notes (Signed)
Physical Therapy Treatment Patient Details Name: [name]. MRN: [id number] DOB: 06/11/1945 Today's Date: 12/06/2012 Time: [id number] PT Time Calculation (min): 47 min  PT Assessment / Plan / Recommendation  History of Present Illness Recently discharged from the hospital for PNA. He was noted to be profoundly hypoglycemic on the morning of admit with bs as low as the 20's. Acute respiratory failure with ETT 9/12-9/13 self extubated. Chronic right foot wound followed by [name] was NWB, back to WBAT on 9/13 and 9/19 returned to NWB RLE    PT Comments   Pt educated for change in weight bearing status per [name]'s office- [name]. Pt saddened by return to lack of mobility and concerned over wife pushing him to hard to mobilize without use of RLE. Increased time trying to don prosthesis but unable to don today. Pt encouraged to continue HEp and educated for pressure relief and PRAFO wear. Pt may benefit from attempting sliding board transfers for lower surfaces.  Follow Up Recommendations        Does the patient have the potential to tolerate intense rehabilitation     Barriers to Discharge        Equipment Recommendations       Recommendations for Other Services    Frequency     Progress towards PT Goals Progress towards PT goals: Not progressing toward goals - comment (due to change in weight bearing status)  Plan Current plan remains appropriate    Precautions / Restrictions Precautions Precautions: Fall Precaution Comments: right foot wound Required Braces or Orthoses: Other Brace/Splint Other Brace/Splint: LLE prosthetic, pt to wear PRAFO at all times Restrictions RLE Weight Bearing: Non weight bearing   Pertinent Vitals/Pain No pain VSS    Mobility  Bed Mobility Supine to Sit: 5: Supervision;With rails;HOB flat Sitting - Scoot to Edge of Bed: 6: Modified independent (Device/Increase time) Transfers Sit to Stand: 3: Mod assist;From bed;From elevated surface Stand  to Sit: 4: Min assist;To bed Details for Transfer Assistance: attempted sit to stand x 4 from elevated bed onto prosthesis. Unable to get prosthesis to lock in and unable to maintain standing or perform OOB transfers due to NWB RLE status    Exercises General Exercises - Lower Extremity Long Arc Quad: AROM;20 reps;Seated;Both Hip Flexion/Marching: AROM;Both;20 reps;Seated   PT Diagnosis:    PT Problem List:   PT Treatment Interventions:     PT Goals (current goals can now be found in the care plan section)    Visit Information  Last PT Received On: 12/06/12 Assistance Needed: +1 Reason Eval/Treat Not Completed: Other (comment) (pt with latest note from [name] to wear PRAFO on RLE ) History of Present Illness: Recently discharged from the hospital for PNA. He was noted to be profoundly hypoglycemic on the morning of admit with bs as low as the 20's. Acute respiratory failure with ETT 9/12-9/13 self extubated. Chronic right foot wound followed by [name] was NWB, back to WBAT on 9/13 and 9/19 returned to NWB RLE     Subjective Data      Cognition  Cognition Arousal/Alertness: Awake/alert Behavior During Therapy: [facility] for tasks assessed/performed Overall Cognitive Status: Within Functional Limits for tasks assessed    Balance     End of Session PT - End of Session Equipment Utilized During Treatment: Gait belt Activity Tolerance: Patient tolerated treatment well Patient left: in bed;with call bell/phone within reach Nurse Communication: Mobility status   [name]     [name] 12/06/2012, 2:15 PM [name]  [name], [name]

## 2012-12-06 NOTE — Progress Notes (Signed)
Dr. Tyrell Antonio is cancelling d/c today.  Second dose TPA given to pt at 1700 by IV team; TPA needs to work for about 2 hours, so IV team RN will come to assess/flush PICC at 1900.  IV team has moved/manipulated the PICC lines so that hopefully, they will be able to flush and pt will be able to get d/c home tomorrow with same PICC.  IV team notified, pt wife [name]), and pt notified of MD's plan to keep pt one more night.

## 2012-12-07 LAB — GLUCOSE, CAPILLARY: Glucose-Capillary: 114 mg/dL — ABNORMAL HIGH (ref 70–99)

## 2012-12-07 LAB — URINALYSIS, ROUTINE W REFLEX MICROSCOPIC
Bilirubin Urine: NEGATIVE
Glucose, UA: NEGATIVE mg/dL
Protein, ur: 30 mg/dL — AB
Specific Gravity, Urine: 1.011 (ref 1.005–1.030)

## 2012-12-07 LAB — URINE MICROSCOPIC-ADD ON

## 2012-12-07 MED ORDER — SODIUM CHLORIDE 0.9 % IJ SOLN: 10.0000 mL | INTRAMUSCULAR | Status: DC | PRN

## 2012-12-07 MED ORDER — INSULIN GLARGINE 100 UNIT/ML ~~LOC~~ SOLN: 16.0000 [IU] | Freq: Every day | SUBCUTANEOUS | Status: DC

## 2012-12-07 MED ORDER — SODIUM CHLORIDE 0.9 % IJ SOLN: 10.0000 mL | Freq: Two times a day (BID) | INTRAMUSCULAR | Status: DC

## 2012-12-07 NOTE — Progress Notes (Signed)
Pt LT prosthesis is not fitting him today.  Pt states PT worked with him yesterday and was unable to put his LT prosthetic leg on.  Pt notified me (RN) at 1330 today.  The plan is to d/c him home today after elevating LT BKA for one hour and attempt to place prosthesis again.  Dr. Tyrell Antonio notified and she asked [place] to page Ortho MD on call.  Dr. Ninfa Linden states there is nothing we can do right now for the pt.  He will have to f/u with Dr. [name] and possibly have a re-fitting.  Pt, wife, and MD notified of plan.

## 2012-12-07 NOTE — Discharge Summary (Signed)
Physician Discharge Summary  [name]. WI:[id number] DOB: [birth year] DOA: 11/26/2012  PCP: [name], MD  Admit date: 11/26/2012 Discharge date: 12/07/2012  Time spent: 35 minutes  Recommendations for Outpatient Follow-up:  1. Need B-met to follow renal function and cbc.  2. Needs to follow up with Dr [name] to determine stop date for antibiotics and follow up on foot infection.  3. Needs to follow up with cardiologist for further evaluation of heart failure.  4. Need repeat x ray to document resolution.   Discharge Diagnoses:    Bilateral pneumonia   Acute respiratory failure with hypoxia   Encephalopathy acute   Hypoglycemia   DM2 (diabetes mellitus, type 2)   Right foot ulcer   Hypertension   Anemia   CKD (chronic kidney disease) stage 3, GFR 30-59 ml/min   Charcot's joint of foot   Acute systolic heart failure-EF 35%    Chronic diastolic heart failure   Discharge Condition: Stable  Diet recommendation: Heart Healthy.   Filed Weights   11/28/12 2314 12/01/12 0500 12/07/12 0544  Weight: 91.67 kg (202 lb 1.5 oz) 92.1 kg (203 lb 0.7 oz) 82.555 kg (182 lb)    History of present illness:  Who was recently discharged from the hospital for PNA. See discharge summary dated yesterday (11/25/12). Since discharge, the patient continued on his 70/30 and lantus insulins. He was noted to be profoundly hypoglycemic on the morning of admit with bs as low as the 20's. The patient received glucagon and orange juice with improvement of blood sugars. He was brought to the ED with improved BS. However, the patient was also found to be mildly hypothermic. The hospitalist service was consulted for admission   Hospital Course:  Patient stable to be discharge. Multiples appointment arrange.   Acute respiratory failure with hypoxia due to:  A) Bilateral pneumonia: unclear if aspiration or healthcare associated.  -improved and stable on RA  -Received Meropenem day 7 vancomycin day  11. Will continue with Vancomycin for foot infection.  -SLP therapy; recommend regular thin diet.  -Chest X ray with improved infiltrates.   B) Acute systolic heart failure/EF [id number] Grade 1 diastolic dysfunction  -appears new, Cards consulted.  -no prior ECHO so unclear if this is acute (due to stressors of sepsis/anemia) or progression of chronic HF -  -? Respiratory failure requiring intubation likely combo PNA and CHF  -Lasix started 9-18.   DM2 (diabetes mellitus, type 2) with hypoglycemia  -CBG's more stable  -Will continue with Lantus, decrease to 16 units , CBG this morning at 75.  -cont SSI  -hypoglycemia pre admit suspected due to poor intake in setting of aggressive glycemic control regimen.   Encephalopathy acute  -resolved.  Seizure disorder  -no evidence of Sz activity.  -Dr Francene Boyers lengthy d/w pt re R/B of Dilantin vs Keppra and plan is to DC Dilantin in favor of Keppra   CKD (chronic kidney disease) stage 3, GFR 30-59 ml/min  -cr stable at 1.4. Stable. Monitor on ACE and low dose lasix.  Renal function stable on low dose lasix.   Right foot ulcer/Charcot's joint of foot // right foot infection.  Was on IV vancomycin and ciprofloxacin. Suppose to be on this medications for 4-6 Weeks. (Counting from 8-12)  Will continue with vancomycin. unna boot ordered.  Appreciate Dr [name] help.  Discussed with Dr Ninfa Linden ok to discharge patient with unna boot and to follow up next week at the office.   Hypertension  -BP controlled-Lopressor  resumed 9/14   Tachycardia  -resolved after hydration   Anemia  -Hgb stable   Procedures: Lower extremity venous duplex  - No evidence of deep vein or superficial thrombosis involving the right lower extremity and left lower extremity. - No evidence of Baker's cyst on the right or left.  2-D echocardiogram  - Left ventricle: The cavity size was mildly dilated. Wall thickness was normal. Systolic function was severely reduced. The  estimated ejection fraction was in the range of 30% to 35%. Doppler parameters are consistent with abnormal left ventricular relaxation (grade 1 diastolic dysfunction). Doppler parameters are consistent with elevated mean left atrial filling pressure. There was a false tendon. . - Regional wall motion abnormality: Moderate hypokinesis of the mid anterior and basal-mid anteroseptal myocardium; mild hypokinesis of the apical anterior and basal inferior myocardium. - Mitral valve: Mild regurgitation. - Left atrium: The atrium was severely dilated. - Atrial septum: No defect or patent foramen ovale was identified. - Pulmonary arteries: PA peak pressure: 40mm Hg (S). - Pericardium, extracardiac: There was a left pleural effusion.      Consultations:  Dr [name]  Dr Flonnie Overman  Discharge Exam: Filed Vitals:   12/07/12 0805  BP: 144/60  Pulse: 80  Temp:   Resp:     General: No distress.  Cardiovascular: S 1, S 2 RRR Respiratory: CTA  Discharge Instructions      Discharge Orders   Future Appointments Provider Department Dept Phone   01/22/2013 3:15 PM Josue Hector, MD [facility]) [phone]   Future Orders Complete By Expires   Diet - low sodium heart healthy  As directed    Increase activity slowly  As directed        Medication List    STOP taking these medications       amLODipine 5 MG tablet  Commonly known as:  NORVASC     insulin aspart protamine- aspart (70-30) 100 UNIT/ML injection  Commonly known as:  NOVOLOG MIX 70/30     lisinopril 10 MG tablet  Commonly known as:  PRINIVIL,ZESTRIL     metoprolol 50 MG tablet  Commonly known as:  LOPRESSOR     phenytoin 100 MG ER capsule  Commonly known as:  DILANTIN     traMADol 50 MG tablet  Commonly known as:  ULTRAM     VANCOMYCIN HCL IV      TAKE these medications       aspirin 325 MG tablet  Take 325 mg by mouth daily.     atorvastatin 20 MG tablet  Commonly known as:   LIPITOR  Take 20 mg by mouth daily.     carvedilol 6.25 MG tablet  Commonly known as:  COREG  Take 1 tablet (6.25 mg total) by mouth 2 (two) times daily with a meal.     ciprofloxacin 250 MG tablet  Commonly known as:  CIPRO  Take 1 tablet (250 mg total) by mouth 2 (two) times daily.     furosemide 20 MG tablet  Commonly known as:  LASIX  Take 0.5 tablets (10 mg total) by mouth daily.     guaiFENesin 600 MG 12 hr tablet  Commonly known as:  MUCINEX  Take 1 tablet (600 mg total) by mouth 2 (two) times daily.     insulin glargine 100 UNIT/ML injection  Commonly known as:  LANTUS  Inject 0.16 mLs (16 Units total) into the skin at bedtime.     levETIRAcetam 500 MG tablet  Commonly known as:  KEPPRA  Take 1 tablet (500 mg total) by mouth 2 (two) times daily.     losartan 25 MG tablet  Commonly known as:  COZAAR  Take 1 tablet (25 mg total) by mouth daily.     polyethylene glycol packet  Commonly known as:  MIRALAX / GLYCOLAX  Take 17 g by mouth daily as needed (constipation).     senna-docusate 8.6-50 MG per tablet  Commonly known as:  Senokot-S  Take 1 tablet by mouth daily.     silver sulfADIAZINE 1 % cream  Commonly known as:  SILVADENE  Apply topically daily.     sodium chloride 0.9 % injection  10-40 mL by Intracatheter route as needed (flush).     tamsulosin 0.4 MG Caps capsule  Commonly known as:  FLOMAX  Take 1 capsule (0.4 mg total) by mouth daily after supper.     timolol 0.5 % ophthalmic solution  Commonly known as:  BETIMOL  Place 1 drop into both eyes every morning.     Travoprost (BAK Free) 0.004 % Soln ophthalmic solution  Commonly known as:  [name]  Place 1 drop into both eyes at bedtime.     vancomycin 1 GM/200ML Soln  Commonly known as:  VANCOCIN  Inject 200 mLs (1,000 mg total) into the vein daily.       Allergies  Allergen Reactions  . Codeine Anaphylaxis  . Penicillins Anaphylaxis   Follow-up Information   Follow up with  [name], MD On 12/10/2012. (@ 2:30 pm for hospital f/u visit with MD Wilson Singer- Please take d/c summary and insurance card to appointment. )    Specialty:  Endocrinology   Contact information:   [address] [place] [address] [phone]       Follow up with Newt Minion, MD.   Specialty:  Orthopedic Surgery   Contact information:   [place] [address] [phone]       Follow up with [name], MD. Call in 2 weeks.   Specialty:  Cardiology   Contact information:   [id number] [place] [address] [phone]        The results of significant diagnostics from this hospitalization (including imaging, microbiology, ancillary and laboratory) are listed below for reference.    Significant Diagnostic Studies: Ct Abdomen Pelvis Wo Contrast  11/29/2012   CLINICAL DATA:  Abdominal distention, nausea, vomiting. Altered mental status.  EXAM: CT CHEST, ABDOMEN AND PELVIS WITHOUT CONTRAST  TECHNIQUE: Multidetector CT imaging of the chest, abdomen and pelvis was performed following the standard protocol without IV contrast.  COMPARISON:  None.  FINDINGS: CT CHEST FINDINGS  There are large bilateral pleural effusions. Compressive atelectasis in the lower lobes. Airspace opacities in both upper lobes, right greater than left. This concerning for pneumonia. Heart is mildly enlarged. Small pericardial effusion. Aorta is normal caliber. Small scattered mediastinal lymph nodes, none pathologically enlarged. No axillary adenopathy.  Tracheostomy tube tip is in the lower trachea above the carina. NG tube enters the stomach.  No acute bony abnormality.  Multiple old right rib fractures.  CT ABDOMEN AND PELVIS FINDINGS  As mentioned, NG tube enters the stomach. Large stool burden throughout the colon, particularly the right colon and transverse colon. Small bowel is decompressed.  Liver, spleen, pancreas, adrenals and kidneys have  a grossly unremarkable unenhanced appearance. Aorta and iliac vessels are normal caliber. Small left inguinal hernia containing fat. Urinary bladder is grossly  unremarkable. Prostate mildly prominent. No free fluid, free air or adenopathy.  Edema/stranding throughout the subcutaneous soft tissues of the abdomen, pelvis and upper aspect of the lower extremities. No acute bony abnormality. Apparent old healed trauma to the right hemipelvis.  IMPRESSION: CT CHEST IMPRESSION  Large bilateral pleural effusions with compressive atelectasis in both lungs. Airspace disease in the upper lobes, right greater the left concerning for pneumonia.  Mild cardiomegaly. Small pericardial effusion.  CT ABDOMEN AND PELVIS IMPRESSION  Large stool burden in the right colon and transverse colon.  No definite acute process in the abdomen or pelvis.   Electronically Signed   By: Rolm Baptise M.D.   On: 11/29/2012 22:17   Dg Chest 2 View  11/29/2012   *RADIOLOGY REPORT*  Clinical Data: Shortness of breath  CHEST - 2 VIEW  Comparison: 11/26/2012  Findings: Cardiac shadow is stable.  A right-sided central venous line is again identified and stable.  The lungs again demonstrate patchy infiltrates bilaterally.  These are slightly more prominent than that seen on the prior exam.  These are most notable in the apices.  IMPRESSION: Slight increase in degree of bilateral infiltrates when compared with the prior exam.   Original Report Authenticated By: Inez Catalina, M.D.   Dg Chest 2 View  11/17/2012   CLINICAL DATA:  Fever, upper back pain.  EXAM: CHEST  2 VIEW  COMPARISON:  10/10/2011  FINDINGS: Patchy bilateral airspace opacities are noted, most pronounced in the upper lobes. Findings concerning for multifocal pneumonia. Heart is borderline in size. Small bilateral pleural effusions. No acute bony abnormality.  IMPRESSION: Patchy multifocal bilateral airspace disease concerning for multifocal pneumonia. Small bilateral effusions.    Electronically Signed   By: Rolm Baptise   On: 11/17/2012 01:51   Dg Tibia/fibula Right  11/29/2012   CLINICAL DATA:  Right lower leg swelling. [name]  EXAM: RIGHT TIBIA AND FIBULA - 2 VIEW  COMPARISON:  06/30/2009.  FINDINGS: Extensive lower extremity soft tissue swelling, nonspecific. There is new fragmentation of the medial malleolus, with indistinct margins. New, but chronic appearing, periosteal changes to the lateral cortex lower tibia. Extensive demineralization of the midfoot. No acute fracture. Extensive vascular calcification.  IMPRESSION: New (since 2011) erosion/ fragmentation of the medial malleolus is concerning for osseous infection given ipsilateral foot osteomyelitis/abscess.   Electronically Signed   By: Jorje Guild   On: 11/29/2012 22:59   Ct Head Wo Contrast  11/29/2012   CLINICAL DATA:  Altered mental status.  EXAM: CT HEAD WITHOUT CONTRAST  TECHNIQUE: Contiguous axial images were obtained from the base of the skull through the vertex without intravenous contrast.  COMPARISON:  MRI 09/30/2010  FINDINGS: Chronic microvascular changes throughout the deep white matter. Old left occipital infarct. No acute intracranial abnormality. Specifically, no hemorrhage, hydrocephalus, mass lesion, acute infarction, or significant intracranial injury. No acute calvarial abnormality. Visualized paranasal sinuses and mastoids clear. Orbital soft tissues unremarkable.  IMPRESSION: Old left occipital infarct. Chronic small vessel disease changes. No acute findings or change.   Electronically Signed   By: Rolm Baptise M.D.   On: 11/29/2012 22:10   Ct Chest Wo Contrast  11/29/2012   CLINICAL DATA:  Abdominal distention, nausea, vomiting. Altered mental status.  EXAM: CT CHEST, ABDOMEN AND PELVIS WITHOUT CONTRAST  TECHNIQUE: Multidetector CT imaging of the chest, abdomen and pelvis was performed following the standard protocol without IV contrast.  COMPARISON:  None.  FINDINGS: CT CHEST FINDINGS  There are  large bilateral pleural effusions.  Compressive atelectasis in the lower lobes. Airspace opacities in both upper lobes, right greater than left. This concerning for pneumonia. Heart is mildly enlarged. Small pericardial effusion. Aorta is normal caliber. Small scattered mediastinal lymph nodes, none pathologically enlarged. No axillary adenopathy.  Tracheostomy tube tip is in the lower trachea above the carina. NG tube enters the stomach.  No acute bony abnormality.  Multiple old right rib fractures.  CT ABDOMEN AND PELVIS FINDINGS  As mentioned, NG tube enters the stomach. Large stool burden throughout the colon, particularly the right colon and transverse colon. Small bowel is decompressed.  Liver, spleen, pancreas, adrenals and kidneys have a grossly unremarkable unenhanced appearance. Aorta and iliac vessels are normal caliber. Small left inguinal hernia containing fat. Urinary bladder is grossly unremarkable. Prostate mildly prominent. No free fluid, free air or adenopathy.  Edema/stranding throughout the subcutaneous soft tissues of the abdomen, pelvis and upper aspect of the lower extremities. No acute bony abnormality. Apparent old healed trauma to the right hemipelvis.  IMPRESSION: CT CHEST IMPRESSION  Large bilateral pleural effusions with compressive atelectasis in both lungs. Airspace disease in the upper lobes, right greater the left concerning for pneumonia.  Mild cardiomegaly. Small pericardial effusion.  CT ABDOMEN AND PELVIS IMPRESSION  Large stool burden in the right colon and transverse colon.  No definite acute process in the abdomen or pelvis.   Electronically Signed   By: Rolm Baptise M.D.   On: 11/29/2012 22:17   Mr Foot Right W Wo Contrast  11/18/2012   *RADIOLOGY REPORT*  Clinical Data: Osteomyelitis.  Foot ulcers.  Recent incision and drainage.  MRI OF THE RIGHT FOREFOOT WITHOUT AND WITH CONTRAST  Technique:  Multiplanar, multisequence MR imaging was performed both before and after  administration of intravenous contrast.  Contrast: 50mL MULTIHANCE GADOBENATE DIMEGLUMINE 529 MG/ML IV SOLN  Comparison: None.  Findings: Transmetatarsal amputations have been performed at the third and fourth metatarsals.  There appears to have been debridement of the medial foot around the tarsometatarsal junction.  There is heterogeneous material extending and, probably representing wound packing material.  There is nonenhancing fluid around the packing material and the collection in the plantar midfoot measures 4 cm transverse by 2.6 cm plantar to dorsal.  This collection tracks proximally to the level of the talus and also extends into the posteromedial tendon sheaths, mainly the posterior tibial tendon.  Findings are consistent with infectious tenosynovitis.  There is another abscess pocket over the dorsal heel.  Neuropathic changes of the midfoot are present however with proximity of the abscess, osteomyelitis is present as well.  Additionally, there is osteomyelitis of the first metatarsal extending to the first MTP joint. On the post-gadolinium imaging, is abnormal enhancement in the tibial plafond and talar dome and ankle effusion compatible with septic arthritis of the ankle. The draining abscess extends distally along the plantar aspect of the great toe.  IMPRESSION: Complex soft tissue infection and extensive osteomyelitis of the left foot.  The area of incision and drainage communicates with a much larger serpentine abscess that tracks the length of the foot with septic arthritis from the ankle to the 1st MTP joint.   Original Report Authenticated By: Dereck Ligas, M.D.   Dg Chest Port 1 View  12/01/2012   CLINICAL DATA:  67 year old male with confusion, pneumonia.  EXAM: PORTABLE CHEST - 1 VIEW  COMPARISON:  11/30/2012 and earlier.  FINDINGS: Portable AP semi upright view at 0610 hr. The patient is extubated. Enteric tube removed. Stable right  PICC line.  Stable lung volumes. Continued veiling  bilateral pulmonary opacity. Continued dense retrocardiac opacity. No pneumothorax. Pulmonary vascularity year remains indistinct.  IMPRESSION: 1. Extubated. Enteric tube removed.  2. Stable lung volumes with widespread veiling opacity suggesting pleural effusions right greater than left.  3.  Continued lower lobe collapse/consolidation.   Electronically Signed   By: Lars Pinks M.D.   On: 12/01/2012 07:22   Dg Chest Port 1 View  11/30/2012   ADDENDUM REPORT: 11/30/2012 01:27  ADDENDUM: Right PICC line is in place. The tip is in the upper to mid right atrium, approximately 3 cm deep to the cavoatrial junction.   Electronically Signed   By: Rolm Baptise M.D.   On: 11/30/2012 01:27   11/30/2012   CLINICAL DATA:  Line placement.  EXAM: PORTABLE CHEST - 1 VIEW  COMPARISON:  11/29/2012  FINDINGS: Endotracheal tube is 3 cm above the carina. Severe diffuse bilateral airspace disease and layering effusions. No real change since prior study. Heart is normal size. No acute bony abnormality.  IMPRESSION: Endotracheal tube and NG tube in stable position.  Stable diffuse bilateral airspace disease and bilateral effusions.  Electronically Signed: By: Rolm Baptise M.D. On: 11/30/2012 01:06   Dg Chest Port 1 View  11/29/2012   *RADIOLOGY REPORT*  Clinical Data: Endotracheal tube placement.  PORTABLE CHEST - 1 VIEW  Comparison: Same date.  Findings: Endotracheal tube is seen projected over tracheal air shadow with distal tip 2 cm above the carina.  Stable cardiomediastinal silhouette.  Significantly increased bilateral basilar opacities is noted consistent with worsening edema or pneumonia.  Associated pleural effusions are noted.  Right-sided PICC line is noted with tip in expected position of the SVC. Nasogastric tube passes through esophagus into stomach.  IMPRESSION: Endotracheal tube in grossly good position.  Worsening bilateral basilar opacities consistent with edema or possibly pneumonia.   Original Report Authenticated  By: Marijo Conception.,  M.D.   Dg Chest Port 1 View  11/26/2012   CLINICAL DATA:  Hypoglycemia, shortness of breath, weakness.  EXAM: PORTABLE CHEST - 1 VIEW  COMPARISON:  11/24/2012  FINDINGS: Patchy bilateral airspace opacities are again noted, minimally improved in the lung bases with increasing lung volumes. No effusions. Heart is normal size. No acute bony abnormality. Right PICC line is unchanged.  IMPRESSION: Patchy right upper lobe and bilateral lower lobe opacities. Slight improvement in aeration in the lung bases.   Electronically Signed   By: Rolm Baptise M.D.   On: 11/26/2012 12:21   Dg Chest Port 1 View  11/24/2012   *RADIOLOGY REPORT*  Clinical Data: Evaluate bilateral pulmonary infiltrates  PORTABLE CHEST - 1 VIEW  Comparison: 11/20/2012; 11/17/2012; 10/10/2011  Findings:  Grossly unchanged borderline enlarged cardiac silhouette and mediastinal contours given reduced lung volumes. Stable positioning of support apparatus. The pulmonary vasculature is less distinct on the present examination with cephalization of flow.  Grossly unchanged right upper lung heterogeneous air space opacities, now with worsening of bilateral medial basilar heterogeneous / consolidative opacity.  No definite pleural effusion or pneumothorax.  Unchanged bones.  IMPRESSION: 1.  Persistent right upper lung heterogeneous air space opacities, now with worsening bilateral lower lung heterogeneous / consolidative opacities, possibly atelectasis though worrisome for progression of multifocal infection.  2.  Suspected mild pulmonary edema.   Original Report Authenticated By: Jake Seats, MD   Dg Chest Port 1 View  11/20/2012   *RADIOLOGY REPORT*  Clinical Data: Follow up bilateral infiltrates  PORTABLE CHEST -  1 VIEW  Comparison: 11/17/12  Findings: The right-sided PICC line is again identified and stable. The cardiac shadow is stable.  Bilateral lateral upper lobe infiltrates are again seen.  Minimal effusions are noted  bilaterally.  IMPRESSION: Stable bilateral infiltrates.   Original Report Authenticated By: Inez Catalina, M.D.   Dg Abd Portable 1v  11/29/2012   CLINICAL DATA:  67 year old male NG tube placement.  EXAM: PORTABLE ABDOMEN - 1 VIEW  COMPARISON:  None.  FINDINGS: Portable AP supine view at 1904 hrs. Enteric tube courses to the left upper quadrant, tip at the level of the gastric body and side hole to level of the gastric fundus. Visualized bowel gas pattern is non obstructed.  IMPRESSION: NG tube tip at the level of the gastric body.   Electronically Signed   By: Lars Pinks M.D.   On: 11/29/2012 19:13    Microbiology: Recent Results (from the past 240 hour(s))  MRSA PCR SCREENING     Status: None   Collection Time    11/29/12  5:22 PM      Result Value Range Status   MRSA by PCR NEGATIVE  NEGATIVE Final   Comment:            The GeneXpert MRSA Assay (FDA     approved for NASAL specimens     only), is one component of a     comprehensive MRSA colonization     surveillance program. It is not     intended to diagnose MRSA     infection nor to guide or     monitor treatment for     MRSA infections.     Labs: Basic Metabolic Panel:  Recent Labs Lab 12/02/12 0630 12/03/12 0500 12/04/12 0530 12/05/12 0525 12/06/12 0600  NA 140 140 137 136 140  K 3.8 3.9 3.7 3.9 4.0  CL 109 109 107 107 110  CO2 21 21 21 20 21   GLUCOSE 204* 183* 79 101* 96  BUN 20 21 20 21 19   CREATININE 1.53* 1.51* 1.50* 1.45* 1.37*  CALCIUM 8.2* 7.7* 7.8* 8.1* 8.1*   Liver Function Tests:  Recent Labs Lab 12/02/12 0630 12/04/12 0530  AST  --  24  ALT  --  16  ALKPHOS  --  92  BILITOT  --  0.1*  PROT  --  5.3*  ALBUMIN 1.6* 1.5*   No results found for this basename: LIPASE, AMYLASE,  in the last 168 hours No results found for this basename: AMMONIA,  in the last 168 hours CBC:  Recent Labs Lab 12/01/12 0500 12/02/12 0630  WBC 8.8 10.9*  HGB 10.4* 10.1*  HCT 32.1* 31.0*  MCV 87.2 86.6  PLT 315  420*   Cardiac Enzymes: No results found for this basename: CKTOTAL, CKMB, CKMBINDEX, TROPONINI,  in the last 168 hours BNP: BNP (last 3 results)  Recent Labs  11/17/12 0016 11/29/12 1740  PROBNP 4406.0* 7199.0*   CBG:  Recent Labs Lab 12/06/12 0813 12/06/12 1132 12/06/12 1807 12/06/12 2046 12/07/12 0745  GLUCAP 75 124* 99 146* 74       Signed:  Xayvion Shirah  [facility] 12/07/2012, 10:53 AM

## 2012-12-07 NOTE — Progress Notes (Signed)
   CARE MANAGEMENT NOTE 12/07/2012  Patient:  [name], [name]   Account Number:  [id number]  Date Initiated:  11/29/2012  Documentation initiated by:  ROYAL,CHERYL  Subjective/Objective Assessment:   Pt active with [facility] for [facility] for IV antibiotics and wound care as well as HHPT.     Action/Plan:   Have asked MD to order [facility] services to be resumed and IV antibiotics to be resumed as needed.   Anticipated DC Date:  11/29/2012   Anticipated DC Plan:  [place]         Choice offered to / List presented to:          [facility] arranged  HH-1 RN  IV Antibiotics  HH-2 PT      Status of service:  Completed, signed off Medicare Important Message given?   (If response is "NO", the following Medicare IM given date fields will be blank) Date Medicare IM given:   Date Additional Medicare IM given:    Discharge Disposition:  [place]  Per UR Regulation:  Reviewed for med. necessity/level of care/duration of stay  If discussed at [employer] of Stay Meetings, dates discussed:   12/05/2012    Comments:  12/07/12 15:25  CM spoke with [facility] pharmacy to notify of pt's discharge today and last run of IV ABX.  Resumption of [facility] services to begin tomorrow 12/10/12.  Notification of resumption orders faxed to [facility].  No other CM needs were communicated.  Mariane Masters, BSN, [place].  12-06-12 [place], [place] [phone] CM did alert [facility] that pt is to be d/c today. IV vancomycin to be given earlier today and [facility] will visit the pt on SAT. No further needs from CM at this time.   12-04-12 [place], RN,BSN [phone] Pt is active with [facility] for [facility] services. Pt continues with IV ABX via PICC line. CM will continue to f/u with disposition needs.  12-01-12 2:35pm Luz Lex, RNBSN [phone] REsp distress on 9-12 - intubated - tx to ICU - now extubated.  Plan for discharge home with home health - active with [facility].

## 2013-01-22 ENCOUNTER — Ambulatory Visit (INDEPENDENT_AMBULATORY_CARE_PROVIDER_SITE_OTHER): Payer: Medicare Other | Admitting: Cardiovascular Disease

## 2013-01-22 ENCOUNTER — Encounter: Payer: Self-pay | Admitting: Cardiovascular Disease

## 2013-01-22 VITALS — BP 125/69 | HR 71 | Ht 70.0 in | Wt 165.2 lb

## 2013-01-22 DIAGNOSIS — I255 Ischemic cardiomyopathy: Secondary | ICD-10-CM

## 2013-01-22 DIAGNOSIS — I2589 Other forms of chronic ischemic heart disease: Secondary | ICD-10-CM

## 2013-01-22 NOTE — Patient Instructions (Signed)
Your physician wants you to follow-up in:   [place]  ECHO SAME DAY  You will receive a reminder letter in the mail two months in advance. If you don't receive a letter, please call our office to schedule the follow-up appointment. Your physician recommends that you continue on your current medications as directed. Please refer to the Current Medication list given to you today.  Your physician has requested that you have a lexiscan myoview. For further information please visit [email or web address]. Please follow instruction sheet, as given.   Your physician has requested that you have an echocardiogram. Echocardiography is a painless test that uses sound waves to create images of your heart. It provides your doctor with information about the size and shape of your heart and how well your heart's chambers and valves are working. This procedure takes approximately one hour. There are no restrictions for this procedure. IN  6 MONTHS

## 2013-01-22 NOTE — Assessment & Plan Note (Signed)
Euvolemic On beta blocker ACE and diuretic.  F/U lexiscan myovue to assess for ischemic DCM given PVD and previous stroke.  ASA

## 2013-01-22 NOTE — Assessment & Plan Note (Signed)
Well controlled.  Continue current medications and low sodium Dash type diet.    

## 2013-01-22 NOTE — Assessment & Plan Note (Signed)
F/U Dr [name] continue non weight bearing and local care

## 2013-01-22 NOTE — Progress Notes (Signed)
Patient ID: [name]., male   DOB: 11-06-1945, 67 y.o.   MRN: [id number] [name] is a [name] with history of DM, CKD stage II, HTN, seizure disorder, and multiple prior episodes of osteomyelitis with recent complications who presented to [facility] 11/26/2012 with hypoglycemia with development of respiratory distress. He was found to have EF of 30-35% by echo thus we are consulted. In July 2014, he underwent surgery on his R foot with placement of antibiotic beads for osteomyelitis. He was admitted 8/5 with severe sepsis complicated by AKI, toxic metabolic encephalopathy with uremia, and possible surgical foot infection requiring I&D. He was discharged with plan for 6 weeks abx but returned 8/30 with confusion, fever, cough and was found to have HCAP, sepsis, and severe anemia with Hgb of 6 later felt due to AOCD + bone marrow suppression from sepsis. He was discharged on 11/25/12 with plan to complete PICC Vanc & oral Cipro but returned the following day on 11/26/12 with hypoglycemia and hypothermia. That day he had taken his insulin, ate his breakfast, got in bed to take a nap and complained of feeling hot. His family checked on him and found him drenched in sweat and disoriented. CBG was 48 and EMS was called - despite food and juice, CBG via EMS was 26 requiring glucagon and D50. CXR showed persistent opacities so antibiotic coverage was expanded. On 11/29/12 he developed acute decompensation with n/v, change in mental status, and hypoxemic respiratory failure requiring intubation ?aspiration event. He also had severe hypoalbuminemia 1.6, troponins neg x 3. D-dimer 1.95 with LE dopp neg for DVT. He was also treated with IV Lasix for 4 days which was stopped due to concern for getting dry with sinus tach. He self-extubated on 9/13 and is now satting normally on RA. As part of his eval, 2D echo was obtained showing mildly dilated LV, EF 30-35% with +WMA as below, grade 1 diastolic dysfunction, mild  MR, severely dilated LA, PA Pressure 13mmHg.   Since hospital d/c improving Still has a tenuous situation with his RLE in cast and non weight bearing.  Limited activity so no chest pain or dyspnea.    ROS: Denies fever, malais, weight loss, blurry vision, decreased visual acuity, cough, sputum, SOB, hemoptysis, pleuritic pain, palpitaitons, heartburn, abdominal pain, melena, lower extremity edema, claudication, or rash.  All other systems reviewed and negative  General: Affect appropriate Chronically ill black male HEENT: normal Neck supple with no adenopathy JVP normal no bruits no thyromegaly Lungs clear with no wheezing and good diaphragmatic motion Heart:  S1/S2 no murmur, no rub, gallop or click PMI normal Abdomen: benighn, BS positve, no tenderness, no AAA no bruit.  No HSM or HJR Distal pulses intact to popliteal S/P left BKA  Right leg in boot with healing infection in foot No edema Neuro non-focal Skin warm and dry No muscular weakness   Current Outpatient Prescriptions  Medication Sig Dispense Refill  . aspirin 325 MG tablet Take 325 mg by mouth daily.      [name] atorvastatin (LIPITOR) 20 MG tablet Take 20 mg by mouth daily.      . carvedilol (COREG) 6.25 MG tablet Take 1 tablet (6.25 mg total) by mouth 2 (two) times daily with a meal.  60 tablet  0  . furosemide (LASIX) 20 MG tablet Take 0.5 tablets (10 mg total) by mouth daily.  30 tablet  0  . insulin glargine (LANTUS) 100 UNIT/ML injection Inject 0.16 mLs (16  Units total) into the skin at bedtime.  10 mL  12  . levETIRAcetam (KEPPRA) 500 MG tablet Take 1 tablet (500 mg total) by mouth 2 (two) times daily.  60 tablet  0  . losartan (COZAAR) 25 MG tablet Take 1 tablet (25 mg total) by mouth daily.  30 tablet  0  . polyethylene glycol (MIRALAX / GLYCOLAX) packet Take 17 g by mouth daily as needed (constipation).  14 each  0  . senna-docusate (SENOKOT-S) 8.6-50 MG per tablet Take 1 tablet by mouth as needed.       . silver  sulfADIAZINE (SILVADENE) 1 % cream Apply topically daily.  50 g  0  . tamsulosin (FLOMAX) 0.4 MG CAPS capsule Take 1 capsule (0.4 mg total) by mouth daily after supper.  30 capsule  5  . timolol (BETIMOL) 0.5 % ophthalmic solution Place 1 drop into both eyes every morning.       . Travoprost, BAK Free, ([name]) 0.004 % SOLN ophthalmic solution Place 1 drop into both eyes at bedtime.       No current facility-administered medications for this visit.    Allergies  Codeine and Penicillins  Electrocardiogram:  9/19 SR rate 89 inferior T wave changes  Assessment and Plan

## 2013-02-10 ENCOUNTER — Encounter: Payer: Self-pay | Admitting: Internal Medicine

## 2013-02-10 ENCOUNTER — Ambulatory Visit (HOSPITAL_COMMUNITY): Payer: Medicare Other | Attending: Cardiovascular Disease | Admitting: Radiology

## 2013-02-10 DIAGNOSIS — N189 Chronic kidney disease, unspecified: Secondary | ICD-10-CM | POA: Insufficient documentation

## 2013-02-10 DIAGNOSIS — I059 Rheumatic mitral valve disease, unspecified: Secondary | ICD-10-CM | POA: Insufficient documentation

## 2013-02-10 DIAGNOSIS — Z8673 Personal history of transient ischemic attack (TIA), and cerebral infarction without residual deficits: Secondary | ICD-10-CM | POA: Insufficient documentation

## 2013-02-10 DIAGNOSIS — E785 Hyperlipidemia, unspecified: Secondary | ICD-10-CM | POA: Insufficient documentation

## 2013-02-10 DIAGNOSIS — I129 Hypertensive chronic kidney disease with stage 1 through stage 4 chronic kidney disease, or unspecified chronic kidney disease: Secondary | ICD-10-CM | POA: Insufficient documentation

## 2013-02-10 DIAGNOSIS — I379 Nonrheumatic pulmonary valve disorder, unspecified: Secondary | ICD-10-CM | POA: Insufficient documentation

## 2013-02-10 DIAGNOSIS — E119 Type 2 diabetes mellitus without complications: Secondary | ICD-10-CM | POA: Insufficient documentation

## 2013-02-10 DIAGNOSIS — I2589 Other forms of chronic ischemic heart disease: Secondary | ICD-10-CM | POA: Insufficient documentation

## 2013-02-10 DIAGNOSIS — I509 Heart failure, unspecified: Secondary | ICD-10-CM | POA: Insufficient documentation

## 2013-02-10 DIAGNOSIS — I255 Ischemic cardiomyopathy: Secondary | ICD-10-CM

## 2013-02-10 DIAGNOSIS — I079 Rheumatic tricuspid valve disease, unspecified: Secondary | ICD-10-CM | POA: Insufficient documentation

## 2013-02-10 NOTE — Progress Notes (Signed)
Echocardiogram performed.  

## 2013-02-11 ENCOUNTER — Encounter: Payer: Self-pay | Admitting: Cardiology

## 2013-02-11 ENCOUNTER — Ambulatory Visit (HOSPITAL_COMMUNITY): Payer: Medicare Other | Attending: Cardiology | Admitting: Radiology

## 2013-02-11 VITALS — BP 127/75 | HR 64 | Ht 71.0 in | Wt 165.0 lb

## 2013-02-11 DIAGNOSIS — I428 Other cardiomyopathies: Secondary | ICD-10-CM | POA: Insufficient documentation

## 2013-02-11 DIAGNOSIS — R9431 Abnormal electrocardiogram [ECG] [EKG]: Secondary | ICD-10-CM

## 2013-02-11 DIAGNOSIS — R0989 Other specified symptoms and signs involving the circulatory and respiratory systems: Secondary | ICD-10-CM | POA: Insufficient documentation

## 2013-02-11 DIAGNOSIS — I255 Ischemic cardiomyopathy: Secondary | ICD-10-CM

## 2013-02-11 DIAGNOSIS — R0609 Other forms of dyspnea: Secondary | ICD-10-CM | POA: Insufficient documentation

## 2013-02-11 DIAGNOSIS — I10 Essential (primary) hypertension: Secondary | ICD-10-CM | POA: Insufficient documentation

## 2013-02-11 MED ORDER — TECHNETIUM TC 99M SESTAMIBI GENERIC - CARDIOLITE
10.0000 | Freq: Once | INTRAVENOUS | Status: AC | PRN
  Administered 2013-02-11: 10 via INTRAVENOUS

## 2013-02-11 MED ORDER — TECHNETIUM TC 99M SESTAMIBI GENERIC - CARDIOLITE
30.0000 | Freq: Once | INTRAVENOUS | Status: AC | PRN
  Administered 2013-02-11: 30 via INTRAVENOUS

## 2013-02-11 MED ORDER — REGADENOSON 0.4 MG/5ML IV SOLN
0.4000 mg | Freq: Once | INTRAVENOUS | Status: AC
  Administered 2013-02-11: 0.4 mg via INTRAVENOUS

## 2013-02-11 NOTE — Progress Notes (Signed)
[facility] [address] [place], [place] [address] [phone]    Cardiology Nuclear Med Study  Devin Gehm. is a 67 y.o. male     MRN : [id number]     DOB: 04-Aug-1945  Procedure Date: 02/11/2013  Nuclear Med Background Indication for Stress Test:  Evaluation for Ischemia History:  No prior known history of CAD, History of seizures, and 12-01-12 Echo: EF=30-35% Cardiac Risk Factors: CVA, Hypertension, IDDM ,Lipids and PVD  Symptoms:  DOE   Nuclear Pre-Procedure Caffeine/Decaff Intake:  None > 12 hrs NPO After: 11:30pm   Lungs:  clear O2 Sat: 99% on room air. IV 0.9% NS with Angio Cath:  22g  IV Site: R Antecubital x 1, tolerated well IV Started by:  Irven Baltimore, RN  Chest Size (in):  42 Cup Size: n/a  Height: 5\' 11"  (1.803 m)  Weight:  165 lb (74.844 kg)  BMI:  Body mass index is 23.02 kg/(m^2). Tech Comments:  Took Coreg and Keppra this am. 1/2 dose insulin last night, no insulin today. Fasting CBG was 154 at 0630 today. Irven Baltimore, RN.    Nuclear Med Study 1 or 2 day study: 1 day  Stress Test Type:  Carlton Adam  Reading MD: Loralie Champagne, MD  Order Authorizing Provider:  [name], MD  Resting Radionuclide: Technetium 76m Sestamibi  Resting Radionuclide Dose: 11.0 mCi   Stress Radionuclide:  Technetium 72m Sestamibi  Stress Radionuclide Dose: 33.0 mCi           Stress Protocol Rest HR: 64 Stress HR: 84  Rest BP: 127/75 Stress BP: 146/55  Exercise Time (min): n/a METS: n/a   Predicted Max HR: 155 bpm % Max HR: 54.19 bpm Rate Pressure Product: 12264   Dose of Adenosine (mg):  n/a Dose of Lexiscan: 0.4 mg  Dose of Atropine (mg): n/a Dose of Dobutamine: n/a mcg/kg/min (at max HR)  Stress Test Technologist: Irven Baltimore, RN  Nuclear Technologist:  Vedia Pereyra, CNMT     Rest Procedure:  Myocardial perfusion imaging was performed at rest 45 minutes following the intravenous administration of Technetium 64m  Sestamibi. Rest ECG: NSR - Normal EKG  Stress Procedure:  The patient received IV Lexiscan 0.4 mg over 15-seconds.  Technetium 55m Sestamibi injected at 30-seconds.  Quantitative spect images were obtained after a 45 minute delay. Stress ECG: No significant change from baseline ECG  QPS Raw Data Images:  There is significant patient motion noted.  Stress Images:  Medium-sized, mild basal to mid anterior perfusion defect and small, moderate basal inferior perfusion defect.  Rest Images:  Medium-sized, mild basal to mid anterior perfusion defect and small, moderate basal inferior perfusion defect.  Subtraction (SDS):  Primarily fixed medium-sized, mild basal to mid anterior perfusion defect and small, moderate basal inferior perfusion defect.  Transient Ischemic Dilatation (Normal <1.22):  0.91 Lung/Heart Ratio (Normal <0.45):  0.31  Quantitative Gated Spect Images QGS EDV:  172 ml QGS ESV:  111 ml  Impression Exercise Capacity:  Lexiscan with no exercise. BP Response:  Normal blood pressure response. Clinical Symptoms:  No significant symptoms noted. ECG Impression:  No significant ST segment change suggestive of ischemia. Comparison with Prior Nuclear Study: No previous nuclear study performed  Overall Impression:  Intermediate risk stress nuclear study with primarily fixed, medium-sized basal to mid anterior and primarily fixed, small basal inferior perfusion defects. There does not appear to be significant ischemia (intermediate risk because of EF).  Difficult study  because of significant patient motion, therefore defects may be artifactual.   LV Ejection Fraction: 36%.  LV Wall Motion:  Global hypokinesis.   Loralie Champagne 02/11/2013

## 2013-03-13 ENCOUNTER — Inpatient Hospital Stay (HOSPITAL_COMMUNITY)
Admission: EM | Admit: 2013-03-13 | Discharge: 2013-03-18 | DRG: 617 | Disposition: A | Discharge status: Skilled Nursing Facility | Payer: Medicare Other | Attending: Family Medicine | Admitting: Family Medicine

## 2013-03-13 ENCOUNTER — Encounter (HOSPITAL_COMMUNITY): Payer: Self-pay | Admitting: Emergency Medicine

## 2013-03-13 DIAGNOSIS — M86179 Other acute osteomyelitis, unspecified ankle and foot: Secondary | ICD-10-CM | POA: Diagnosis present

## 2013-03-13 DIAGNOSIS — L02619 Cutaneous abscess of unspecified foot: Secondary | ICD-10-CM | POA: Diagnosis present

## 2013-03-13 DIAGNOSIS — S88119A Complete traumatic amputation at level between knee and ankle, unspecified lower leg, initial encounter: Secondary | ICD-10-CM

## 2013-03-13 DIAGNOSIS — N182 Chronic kidney disease, stage 2 (mild): Secondary | ICD-10-CM | POA: Diagnosis present

## 2013-03-13 DIAGNOSIS — R739 Hyperglycemia, unspecified: Secondary | ICD-10-CM

## 2013-03-13 DIAGNOSIS — E162 Hypoglycemia, unspecified: Secondary | ICD-10-CM

## 2013-03-13 DIAGNOSIS — A419 Sepsis, unspecified organism: Secondary | ICD-10-CM

## 2013-03-13 DIAGNOSIS — L97909 Non-pressure chronic ulcer of unspecified part of unspecified lower leg with unspecified severity: Secondary | ICD-10-CM

## 2013-03-13 DIAGNOSIS — G9340 Encephalopathy, unspecified: Secondary | ICD-10-CM

## 2013-03-13 DIAGNOSIS — M8610 Other acute osteomyelitis, unspecified site: Secondary | ICD-10-CM

## 2013-03-13 DIAGNOSIS — M9080 Osteopathy in diseases classified elsewhere, unspecified site: Secondary | ICD-10-CM | POA: Diagnosis present

## 2013-03-13 DIAGNOSIS — E1149 Type 2 diabetes mellitus with other diabetic neurological complication: Secondary | ICD-10-CM | POA: Diagnosis present

## 2013-03-13 DIAGNOSIS — I7090 Unspecified atherosclerosis: Secondary | ICD-10-CM | POA: Diagnosis present

## 2013-03-13 DIAGNOSIS — I129 Hypertensive chronic kidney disease with stage 1 through stage 4 chronic kidney disease, or unspecified chronic kidney disease: Secondary | ICD-10-CM | POA: Diagnosis present

## 2013-03-13 DIAGNOSIS — L97409 Non-pressure chronic ulcer of unspecified heel and midfoot with unspecified severity: Secondary | ICD-10-CM | POA: Diagnosis present

## 2013-03-13 DIAGNOSIS — M869 Osteomyelitis, unspecified: Secondary | ICD-10-CM

## 2013-03-13 DIAGNOSIS — J9601 Acute respiratory failure with hypoxia: Secondary | ICD-10-CM

## 2013-03-13 DIAGNOSIS — Z886 Allergy status to analgesic agent status: Secondary | ICD-10-CM

## 2013-03-13 DIAGNOSIS — I10 Essential (primary) hypertension: Secondary | ICD-10-CM

## 2013-03-13 DIAGNOSIS — E119 Type 2 diabetes mellitus without complications: Secondary | ICD-10-CM

## 2013-03-13 DIAGNOSIS — Z8673 Personal history of transient ischemic attack (TIA), and cerebral infarction without residual deficits: Secondary | ICD-10-CM

## 2013-03-13 DIAGNOSIS — I509 Heart failure, unspecified: Secondary | ICD-10-CM | POA: Diagnosis present

## 2013-03-13 DIAGNOSIS — Z880 Allergy status to penicillin: Secondary | ICD-10-CM

## 2013-03-13 DIAGNOSIS — E1169 Type 2 diabetes mellitus with other specified complication: Principal | ICD-10-CM | POA: Diagnosis present

## 2013-03-13 DIAGNOSIS — Y849 Medical procedure, unspecified as the cause of abnormal reaction of the patient, or of later complication, without mention of misadventure at the time of the procedure: Secondary | ICD-10-CM | POA: Diagnosis present

## 2013-03-13 DIAGNOSIS — I5032 Chronic diastolic (congestive) heart failure: Secondary | ICD-10-CM

## 2013-03-13 DIAGNOSIS — S98139A Complete traumatic amputation of one unspecified lesser toe, initial encounter: Secondary | ICD-10-CM

## 2013-03-13 DIAGNOSIS — E11628 Type 2 diabetes mellitus with other skin complications: Secondary | ICD-10-CM

## 2013-03-13 DIAGNOSIS — E1159 Type 2 diabetes mellitus with other circulatory complications: Secondary | ICD-10-CM | POA: Diagnosis present

## 2013-03-13 DIAGNOSIS — I798 Other disorders of arteries, arterioles and capillaries in diseases classified elsewhere: Secondary | ICD-10-CM | POA: Diagnosis present

## 2013-03-13 DIAGNOSIS — L03116 Cellulitis of left lower limb: Secondary | ICD-10-CM

## 2013-03-13 DIAGNOSIS — N189 Chronic kidney disease, unspecified: Secondary | ICD-10-CM

## 2013-03-13 DIAGNOSIS — N400 Enlarged prostate without lower urinary tract symptoms: Secondary | ICD-10-CM

## 2013-03-13 DIAGNOSIS — G40909 Epilepsy, unspecified, not intractable, without status epilepticus: Secondary | ICD-10-CM | POA: Diagnosis present

## 2013-03-13 DIAGNOSIS — E1142 Type 2 diabetes mellitus with diabetic polyneuropathy: Secondary | ICD-10-CM | POA: Diagnosis present

## 2013-03-13 DIAGNOSIS — Z794 Long term (current) use of insulin: Secondary | ICD-10-CM

## 2013-03-13 DIAGNOSIS — I5021 Acute systolic (congestive) heart failure: Secondary | ICD-10-CM

## 2013-03-13 DIAGNOSIS — L97519 Non-pressure chronic ulcer of other part of right foot with unspecified severity: Secondary | ICD-10-CM

## 2013-03-13 DIAGNOSIS — N183 Chronic kidney disease, stage 3 unspecified: Secondary | ICD-10-CM

## 2013-03-13 DIAGNOSIS — D638 Anemia in other chronic diseases classified elsewhere: Secondary | ICD-10-CM | POA: Diagnosis present

## 2013-03-13 DIAGNOSIS — A4901 Methicillin susceptible Staphylococcus aureus infection, unspecified site: Secondary | ICD-10-CM | POA: Diagnosis present

## 2013-03-13 DIAGNOSIS — D649 Anemia, unspecified: Secondary | ICD-10-CM

## 2013-03-13 DIAGNOSIS — N179 Acute kidney failure, unspecified: Secondary | ICD-10-CM

## 2013-03-13 DIAGNOSIS — J189 Pneumonia, unspecified organism: Secondary | ICD-10-CM

## 2013-03-13 DIAGNOSIS — Z7982 Long term (current) use of aspirin: Secondary | ICD-10-CM

## 2013-03-13 DIAGNOSIS — Z79899 Other long term (current) drug therapy: Secondary | ICD-10-CM

## 2013-03-13 DIAGNOSIS — A498 Other bacterial infections of unspecified site: Secondary | ICD-10-CM | POA: Diagnosis present

## 2013-03-13 LAB — CBC WITH DIFFERENTIAL/PLATELET
Basophils Absolute: 0 10*3/uL (ref 0.0–0.1)
Basophils Relative: 0 % (ref 0–1)
Eosinophils Absolute: 0.2 10*3/uL (ref 0.0–0.7)
Eosinophils Relative: 2 % (ref 0–5)
Lymphocytes Relative: 8 % — ABNORMAL LOW (ref 12–46)
Lymphs Abs: 0.9 10*3/uL (ref 0.7–4.0)
MCH: 28.1 pg (ref 26.0–34.0)
MCHC: 33.1 g/dL (ref 30.0–36.0)
MCV: 84.9 fL (ref 78.0–100.0)
Monocytes Absolute: 1.7 10*3/uL — ABNORMAL HIGH (ref 0.1–1.0)
Neutrophils Relative %: 75 % (ref 43–77)
Platelets: 232 10*3/uL (ref 150–400)
RBC: 2.85 MIL/uL — ABNORMAL LOW (ref 4.22–5.81)
RDW: 13.3 % (ref 11.5–15.5)
WBC: 11.4 10*3/uL — ABNORMAL HIGH (ref 4.0–10.5)

## 2013-03-13 LAB — COMPREHENSIVE METABOLIC PANEL
AST: 17 U/L (ref 0–37)
Albumin: 2.5 g/dL — ABNORMAL LOW (ref 3.5–5.2)
Alkaline Phosphatase: 67 U/L (ref 39–117)
BUN: 55 mg/dL — ABNORMAL HIGH (ref 6–23)
CO2: 21 mEq/L (ref 19–32)
Chloride: 103 mEq/L (ref 96–112)
Creatinine, Ser: 2.76 mg/dL — ABNORMAL HIGH (ref 0.50–1.35)
GFR calc non Af Amer: 23 mL/min — ABNORMAL LOW (ref >90)
Glucose, Bld: 271 mg/dL — ABNORMAL HIGH (ref 70–99)
Potassium: 5.3 mEq/L — ABNORMAL HIGH (ref 3.5–5.1)
Total Bilirubin: 0.3 mg/dL (ref 0.3–1.2)

## 2013-03-13 LAB — GLUCOSE, CAPILLARY: Glucose-Capillary: 260 mg/dL — ABNORMAL HIGH (ref 70–99)

## 2013-03-13 MED ORDER — VANCOMYCIN HCL IN DEXTROSE 1-5 GM/200ML-% IV SOLN
1000.0000 mg | Freq: Once | INTRAVENOUS | Status: AC
  Administered 2013-03-13: 1000 mg via INTRAVENOUS
  Filled 2013-03-13: qty 200

## 2013-03-13 NOTE — ED Notes (Signed)
Fell on Tuesday, saw ortho 12/24 for right foot opened area.  Wife reports ortho "cut on it"  But when asked about pain, reports pain left BKA stump area.  Removed prosthetic, opened area noted.
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
## 2013-03-13 NOTE — ED Notes (Addendum)
Blood sugars up and down since Sunday. Today, am 188, 1705: 326; 1209: 409. Taking 60 units of lantus daily. Ulcerations to right foot. In/out hospital for right foot. Fell other day, and some of the heel of rt. Foot cut off - puss drainage. Bruising to left elbow. Full rom.

## 2013-03-14 ENCOUNTER — Encounter (HOSPITAL_COMMUNITY): Payer: Self-pay | Admitting: Internal Medicine

## 2013-03-14 ENCOUNTER — Inpatient Hospital Stay (HOSPITAL_COMMUNITY): Payer: Medicare Other

## 2013-03-14 DIAGNOSIS — L02619 Cutaneous abscess of unspecified foot: Secondary | ICD-10-CM

## 2013-03-14 DIAGNOSIS — E11628 Type 2 diabetes mellitus with other skin complications: Secondary | ICD-10-CM | POA: Diagnosis present

## 2013-03-14 DIAGNOSIS — E1169 Type 2 diabetes mellitus with other specified complication: Principal | ICD-10-CM

## 2013-03-14 DIAGNOSIS — N189 Chronic kidney disease, unspecified: Secondary | ICD-10-CM

## 2013-03-14 DIAGNOSIS — N289 Disorder of kidney and ureter, unspecified: Secondary | ICD-10-CM

## 2013-03-14 DIAGNOSIS — N179 Acute kidney failure, unspecified: Secondary | ICD-10-CM

## 2013-03-14 DIAGNOSIS — L97509 Non-pressure chronic ulcer of other part of unspecified foot with unspecified severity: Secondary | ICD-10-CM

## 2013-03-14 DIAGNOSIS — R739 Hyperglycemia, unspecified: Secondary | ICD-10-CM | POA: Diagnosis present

## 2013-03-14 LAB — BASIC METABOLIC PANEL
Chloride: 110 mEq/L (ref 96–112)
GFR calc Af Amer: 27 mL/min — ABNORMAL LOW (ref >90)
GFR calc non Af Amer: 23 mL/min — ABNORMAL LOW (ref >90)
Potassium: 4.9 mEq/L (ref 3.5–5.1)
Sodium: 140 mEq/L (ref 135–145)

## 2013-03-14 LAB — CBC
HCT: 22.5 % — ABNORMAL LOW (ref 39.0–52.0)
Hemoglobin: 7.5 g/dL — ABNORMAL LOW (ref 13.0–17.0)
MCH: 28.3 pg (ref 26.0–34.0)
RBC: 2.65 MIL/uL — ABNORMAL LOW (ref 4.22–5.81)
WBC: 8.7 10*3/uL (ref 4.0–10.5)

## 2013-03-14 LAB — GLUCOSE, CAPILLARY
Glucose-Capillary: 296 mg/dL — ABNORMAL HIGH (ref 70–99)
Glucose-Capillary: 234 mg/dL — ABNORMAL HIGH (ref 70–99)
Glucose-Capillary: 87 mg/dL (ref 70–99)
Glucose-Capillary: 67 mg/dL — ABNORMAL LOW (ref 70–99)
Glucose-Capillary: 100 mg/dL — ABNORMAL HIGH (ref 70–99)
Glucose-Capillary: 134 mg/dL — ABNORMAL HIGH (ref 70–99)

## 2013-03-14 LAB — RETICULOCYTES: Retic Count, Absolute: 38.1 10*3/uL (ref 19.0–186.0)

## 2013-03-14 MED ORDER — LATANOPROST 0.005 % OP SOLN
1.0000 [drp] | Freq: Every day | OPHTHALMIC | Status: DC
  Administered 2013-03-14 – 2013-03-17 (×5): 1 [drp] via OPHTHALMIC
  Filled 2013-03-14 (×2): qty 2.5

## 2013-03-14 MED ORDER — DOCUSATE SODIUM 100 MG PO CAPS
100.0000 mg | ORAL_CAPSULE | Freq: Two times a day (BID) | ORAL | Status: DC
  Administered 2013-03-14 – 2013-03-18 (×8): 100 mg via ORAL
  Filled 2013-03-14 (×10): qty 1

## 2013-03-14 MED ORDER — ONDANSETRON HCL 4 MG PO TABS: 4.0000 mg | ORAL_TABLET | Freq: Four times a day (QID) | ORAL | Status: DC | PRN

## 2013-03-14 MED ORDER — ASPIRIN 325 MG PO TABS
325.0000 mg | ORAL_TABLET | Freq: Every day | ORAL | Status: DC
  Administered 2013-03-14 – 2013-03-18 (×4): 325 mg via ORAL
  Filled 2013-03-14 (×5): qty 1

## 2013-03-14 MED ORDER — LEVETIRACETAM 500 MG PO TABS
500.0000 mg | ORAL_TABLET | Freq: Two times a day (BID) | ORAL | Status: DC
  Administered 2013-03-14 – 2013-03-18 (×9): 500 mg via ORAL
  Filled 2013-03-14 (×11): qty 1

## 2013-03-14 MED ORDER — INSULIN GLARGINE 100 UNIT/ML ~~LOC~~ SOLN
10.0000 [IU] | Freq: Every day | SUBCUTANEOUS | Status: DC
  Administered 2013-03-14 – 2013-03-17 (×5): 10 [IU] via SUBCUTANEOUS
  Filled 2013-03-14 (×6): qty 0.1

## 2013-03-14 MED ORDER — SODIUM CHLORIDE 0.9 % IV SOLN
INTRAVENOUS | Status: DC
  Administered 2013-03-14 – 2013-03-16 (×5): via INTRAVENOUS

## 2013-03-14 MED ORDER — SENNOSIDES-DOCUSATE SODIUM 8.6-50 MG PO TABS
1.0000 | ORAL_TABLET | ORAL | Status: DC | PRN
  Administered 2013-03-17: 1 via ORAL
  Filled 2013-03-14: qty 1

## 2013-03-14 MED ORDER — TRAMADOL HCL 50 MG PO TABS
50.0000 mg | ORAL_TABLET | Freq: Four times a day (QID) | ORAL | Status: DC | PRN
  Administered 2013-03-14 – 2013-03-16 (×3): 100 mg via ORAL
  Filled 2013-03-14 (×4): qty 2

## 2013-03-14 MED ORDER — TIMOLOL MALEATE 0.5 % OP SOLN
1.0000 [drp] | Freq: Every morning | OPHTHALMIC | Status: DC
  Administered 2013-03-14 – 2013-03-18 (×4): 1 [drp] via OPHTHALMIC
  Filled 2013-03-14 (×2): qty 5

## 2013-03-14 MED ORDER — CARVEDILOL 6.25 MG PO TABS
6.2500 mg | ORAL_TABLET | Freq: Two times a day (BID) | ORAL | Status: DC
  Administered 2013-03-14 – 2013-03-18 (×9): 6.25 mg via ORAL
  Filled 2013-03-14 (×11): qty 1

## 2013-03-14 MED ORDER — ONDANSETRON HCL 4 MG/2ML IJ SOLN: 4.0000 mg | Freq: Four times a day (QID) | INTRAMUSCULAR | Status: DC | PRN

## 2013-03-14 MED ORDER — ATORVASTATIN CALCIUM 20 MG PO TABS
20.0000 mg | ORAL_TABLET | Freq: Every day | ORAL | Status: DC
  Administered 2013-03-14 – 2013-03-18 (×4): 20 mg via ORAL
  Filled 2013-03-14 (×5): qty 1

## 2013-03-14 MED ORDER — POLYETHYLENE GLYCOL 3350 17 G PO PACK
17.0000 g | PACK | Freq: Every day | ORAL | Status: DC | PRN
  Administered 2013-03-17: 17 g via ORAL
  Filled 2013-03-14: qty 1

## 2013-03-14 MED ORDER — INSULIN ASPART 100 UNIT/ML ~~LOC~~ SOLN
0.0000 [IU] | SUBCUTANEOUS | Status: DC
  Administered 2013-03-14: 2 [IU] via SUBCUTANEOUS
  Administered 2013-03-14: 5 [IU] via SUBCUTANEOUS
  Administered 2013-03-14 (×2): 3 [IU] via SUBCUTANEOUS
  Administered 2013-03-15 (×2): 2 [IU] via SUBCUTANEOUS
  Administered 2013-03-15: 3 [IU] via SUBCUTANEOUS
  Administered 2013-03-16: 2 [IU] via SUBCUTANEOUS
  Administered 2013-03-16: 3 [IU] via SUBCUTANEOUS
  Administered 2013-03-17 – 2013-03-18 (×4): 2 [IU] via SUBCUTANEOUS

## 2013-03-14 MED ORDER — TAMSULOSIN HCL 0.4 MG PO CAPS
0.4000 mg | ORAL_CAPSULE | Freq: Every day | ORAL | Status: DC
  Administered 2013-03-14 – 2013-03-17 (×4): 0.4 mg via ORAL
  Filled 2013-03-14 (×5): qty 1

## 2013-03-14 MED ORDER — VANCOMYCIN HCL IN DEXTROSE 1-5 GM/200ML-% IV SOLN
1000.0000 mg | INTRAVENOUS | Status: DC
  Administered 2013-03-14: 1000 mg via INTRAVENOUS
  Filled 2013-03-14 (×2): qty 200

## 2013-03-14 MED ORDER — HEPARIN SODIUM (PORCINE) 5000 UNIT/ML IJ SOLN
5000.0000 [IU] | Freq: Three times a day (TID) | INTRAMUSCULAR | Status: DC
  Administered 2013-03-14 – 2013-03-18 (×14): 5000 [IU] via SUBCUTANEOUS
  Filled 2013-03-14 (×19): qty 1

## 2013-03-14 MED ORDER — CIPROFLOXACIN IN D5W 400 MG/200ML IV SOLN
400.0000 mg | Freq: Two times a day (BID) | INTRAVENOUS | Status: DC
  Administered 2013-03-14 – 2013-03-15 (×4): 400 mg via INTRAVENOUS
  Filled 2013-03-14 (×5): qty 200

## 2013-03-14 NOTE — Progress Notes (Signed)
Patient seen and evaluated earlier this morning by my associate please refer to his H&P for details regarding his assessment and plan.  Consulted orthopedic surgery for evaluation of wound. Plans are for surgical intervention most likely next a.m. 03/15/2013.  Patient's able to eat today but will make n.p.o. at midnight  We'll reassess next a.m.  Loretto Belinsky, [employer]

## 2013-03-14 NOTE — Progress Notes (Signed)
Patient ID: [name]., male   DOB: [birth year], 67 y.o.   MRN: [id number] Aware of patient and will see.
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
## 2013-03-14 NOTE — Progress Notes (Signed)
Patient ID: [name]., male   DOB: 1945-11-02, 67 y.o.   MRN: [id number] Results of left leg xray discussed. Scheduled for OR tomorrow AM 10AM-12Noon. Will plan to revise the left BKA shortening the tibia one inch and performing A primary closure of the distal left BKA stump after resection of the open wound.  Then the right foot will undergo an open little toe amputation at the distal metatarsal  Level.
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
## 2013-03-14 NOTE — ED Notes (Signed)
Report to floor RN Maudie Mercury.  Pt to floor with EDT to transport.

## 2013-03-14 NOTE — Progress Notes (Signed)
Patient received via stretcher from the ED at [place] with wife and daughter at bedside.  Patient is alert and oriented X4, pleasant.  Pt/family oriented to room/unit and plan of care/orders reviewed with them and they verbalized understanding.  Left prosthetic leg at bedside.    Patient presents with multiple open wounds to BLE.  Please refer to nurse's assessment flowsheet for details.  Left stump has small opening with moderate amount of odorous green purulent drainage.  Right foot with multiple areas including lateral right 5th toe with purulent/bloody drainage, anterior foot with 2 areas with yellow bases and minimal bloody drainage, medial foot with 2 open areas with yellow bases and no drainage.  Also, there is a large black painful area to right heel, nondraining.    All wounds were cleansed with normal saline and dry gauze dressings applied.  Pt tolerated well, reporting painful left stump and painful right heel.  BLE elevated.  Prafo boot from home applied to right foot per wife instruction.

## 2013-03-14 NOTE — Progress Notes (Signed)
Patient ID: [name]., male   DOB: 1945/10/28, 67 y.o.   MRN: [id number] Subjective:   Procedure(s) (LRB): AMPUTATION BELOW KNEE (Left) AMPUTATION DIGIT (Right) Awake, alert and oriented x 3. Patient reports pain as mild.    Objective:   VITALS:  Temp:  [98.2 F (36.8 C)-99 F (37.2 C)] 98.2 F (36.8 C) (12/26 1449) Pulse Rate:  [69-79] 71 (12/26 1449) Resp:  [16-18] 18 (12/26 1449) BP: (117-142)/(48-69) 121/52 mmHg (12/26 1449) SpO2:  [99 %-100 %] 100 % (12/26 1449) Weight:  [71.759 kg (158 lb 3.2 oz)] 71.759 kg (158 lb 3.2 oz) (12/26 0206)  ABD soft Incision: dressing C/D/I Xrays of the left BKA residual limb negative for bone changes consistent with osteomyelitis.  LABS  Recent Labs  03/13/13 2123 03/14/13 0535  HGB 8.0* 7.5*  WBC 11.4* 8.7  PLT 232 227    Recent Labs  03/13/13 2123 03/14/13 0535  NA 135 140  K 5.3* 4.9  CL 103 110  CO2 21 19  BUN 55* 54*  CREATININE 2.76* 2.70*  GLUCOSE 271* 191*   No results found for this basename: LABPT, INR,  in the last 72 hours   Assessment/Plan:   Procedure(s) (LRB): AMPUTATION BELOW KNEE (Left) AMPUTATION DIGIT (Right)  Will plan to perform left BKA revision and right 5th toe amputation.  Muaad Boehning E 03/14/2013, 8:43 PM
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
## 2013-03-15 ENCOUNTER — Encounter (HOSPITAL_COMMUNITY): Payer: Medicare Other | Admitting: Anesthesiology

## 2013-03-15 ENCOUNTER — Inpatient Hospital Stay (HOSPITAL_COMMUNITY): Payer: Medicare Other | Admitting: Anesthesiology

## 2013-03-15 ENCOUNTER — Encounter (HOSPITAL_COMMUNITY)
Admission: EM | Disposition: A | Discharge status: Skilled Nursing Facility | Payer: Self-pay | Source: Home / Self Care | Attending: Family Medicine

## 2013-03-15 ENCOUNTER — Encounter (HOSPITAL_COMMUNITY): Payer: Self-pay | Admitting: Anesthesiology

## 2013-03-15 DIAGNOSIS — R7309 Other abnormal glucose: Secondary | ICD-10-CM

## 2013-03-15 DIAGNOSIS — M869 Osteomyelitis, unspecified: Secondary | ICD-10-CM | POA: Diagnosis present

## 2013-03-15 DIAGNOSIS — M8610 Other acute osteomyelitis, unspecified site: Secondary | ICD-10-CM

## 2013-03-15 DIAGNOSIS — J9600 Acute respiratory failure, unspecified whether with hypoxia or hypercapnia: Secondary | ICD-10-CM

## 2013-03-15 DIAGNOSIS — E119 Type 2 diabetes mellitus without complications: Secondary | ICD-10-CM

## 2013-03-15 DIAGNOSIS — I5021 Acute systolic (congestive) heart failure: Secondary | ICD-10-CM

## 2013-03-15 DIAGNOSIS — L97909 Non-pressure chronic ulcer of unspecified part of unspecified lower leg with unspecified severity: Secondary | ICD-10-CM

## 2013-03-15 HISTORY — PX: AMPUTATION: SHX166

## 2013-03-15 LAB — GLUCOSE, CAPILLARY
Glucose-Capillary: 154 mg/dL — ABNORMAL HIGH (ref 70–99)
Glucose-Capillary: 75 mg/dL (ref 70–99)
Glucose-Capillary: 83 mg/dL (ref 70–99)

## 2013-03-15 LAB — GRAM STAIN

## 2013-03-15 LAB — VITAMIN B12: Vitamin B-12: 857 pg/mL (ref 211–911)

## 2013-03-15 LAB — FERRITIN: Ferritin: 791 ng/mL — ABNORMAL HIGH (ref 22–322)

## 2013-03-15 LAB — TYPE AND SCREEN: Antibody Screen: NEGATIVE

## 2013-03-15 LAB — FOLATE: Folate: 18.1 ng/mL

## 2013-03-15 SURGERY — AMPUTATION BELOW KNEE
Anesthesia: General | Site: Leg Lower | Laterality: Right

## 2013-03-15 MED ORDER — FENTANYL CITRATE 0.05 MG/ML IJ SOLN: 25.0000 ug | INTRAMUSCULAR | Status: DC | PRN

## 2013-03-15 MED ORDER — CIPROFLOXACIN IN D5W 400 MG/200ML IV SOLN
400.0000 mg | INTRAVENOUS | Status: DC
  Administered 2013-03-16: 400 mg via INTRAVENOUS
  Filled 2013-03-15: qty 200

## 2013-03-15 MED ORDER — PROPOFOL 10 MG/ML IV BOLUS
INTRAVENOUS | Status: DC | PRN
  Administered 2013-03-15: 170 mg via INTRAVENOUS

## 2013-03-15 MED ORDER — PHENYLEPHRINE HCL 10 MG/ML IJ SOLN
INTRAMUSCULAR | Status: DC | PRN
  Administered 2013-03-15 (×5): 80 ug via INTRAVENOUS

## 2013-03-15 MED ORDER — LACTATED RINGERS IV SOLN
INTRAVENOUS | Status: DC | PRN
  Administered 2013-03-15: 08:00:00 via INTRAVENOUS

## 2013-03-15 MED ORDER — FENTANYL CITRATE 0.05 MG/ML IJ SOLN
INTRAMUSCULAR | Status: DC | PRN
  Administered 2013-03-15 (×2): 25 ug via INTRAVENOUS

## 2013-03-15 MED ORDER — ONDANSETRON HCL 4 MG/2ML IJ SOLN
INTRAMUSCULAR | Status: DC | PRN
  Administered 2013-03-15: 4 mg via INTRAVENOUS

## 2013-03-15 MED ORDER — PHENYLEPHRINE HCL 10 MG/ML IJ SOLN
10.0000 mg | INTRAVENOUS | Status: DC | PRN
  Administered 2013-03-15: 25 ug/min via INTRAVENOUS

## 2013-03-15 MED ORDER — LIDOCAINE HCL (CARDIAC) 20 MG/ML IV SOLN
INTRAVENOUS | Status: DC | PRN
  Administered 2013-03-15: 70 mg via INTRAVENOUS

## 2013-03-15 MED ORDER — VANCOMYCIN HCL IN DEXTROSE 750-5 MG/150ML-% IV SOLN
750.0000 mg | INTRAVENOUS | Status: DC
  Administered 2013-03-16: 750 mg via INTRAVENOUS
  Filled 2013-03-15 (×2): qty 150

## 2013-03-15 MED ORDER — 0.9 % SODIUM CHLORIDE (POUR BTL) OPTIME
TOPICAL | Status: DC | PRN
  Administered 2013-03-15: 1000 mL

## 2013-03-15 MED ORDER — MIDAZOLAM HCL 5 MG/5ML IJ SOLN
INTRAMUSCULAR | Status: DC | PRN
  Administered 2013-03-15: 2 mg via INTRAVENOUS

## 2013-03-15 SURGICAL SUPPLY — 72 items
BANDAGE ELASTIC 4 VELCRO ST LF (GAUZE/BANDAGES/DRESSINGS) IMPLANT
BANDAGE ELASTIC 6 VELCRO ST LF (GAUZE/BANDAGES/DRESSINGS) ×3 IMPLANT
BANDAGE ESMARK 6X9 LF (GAUZE/BANDAGES/DRESSINGS) ×2 IMPLANT
BANDAGE GAUZE ELAST BULKY 4 IN (GAUZE/BANDAGES/DRESSINGS) IMPLANT
BLADE LONG MED 31X9 (MISCELLANEOUS) ×3 IMPLANT
BLADE SAW SAG 29X58X.64 (BLADE) IMPLANT
BNDG CMPR 9X6 STRL LF SNTH (GAUZE/BANDAGES/DRESSINGS) ×2
BNDG COHESIVE 4X5 TAN STRL (GAUZE/BANDAGES/DRESSINGS) ×3 IMPLANT
BNDG COHESIVE 6X5 TAN STRL LF (GAUZE/BANDAGES/DRESSINGS) ×3 IMPLANT
BNDG ESMARK 6X9 LF (GAUZE/BANDAGES/DRESSINGS) ×3
BNDG GAUZE ELAST 4 BULKY (GAUZE/BANDAGES/DRESSINGS) ×3 IMPLANT
CLOTH BEACON ORANGE TIMEOUT ST (SAFETY) IMPLANT
CONT SPEC 4OZ CLIKSEAL STRL BL (MISCELLANEOUS) ×3 IMPLANT
CUFF TOURNIQUET SINGLE 34IN LL (TOURNIQUET CUFF) ×6 IMPLANT
CUFF TOURNIQUET SINGLE 44IN (TOURNIQUET CUFF) IMPLANT
DRAIN PENROSE 1/4X12 LTX STRL (WOUND CARE) ×3 IMPLANT
DRAIN PENROSE 18X1/2 LTX STRL (DRAIN) IMPLANT
DRAIN PENROSE 18X1/4 LTX STRL (WOUND CARE) IMPLANT
DRAPE EXTREMITY T 121X128X90 (DRAPE) IMPLANT
DRAPE PROXIMA HALF (DRAPES) ×3 IMPLANT
DRSG ADAPTIC 3X8 NADH LF (GAUZE/BANDAGES/DRESSINGS) ×3 IMPLANT
DRSG EMULSION OIL 3X16 NADH (GAUZE/BANDAGES/DRESSINGS) IMPLANT
DRSG PAD ABDOMINAL 8X10 ST (GAUZE/BANDAGES/DRESSINGS) ×6 IMPLANT
DURAPREP 26ML APPLICATOR (WOUND CARE) ×3 IMPLANT
ELECT REM PT RETURN 9FT ADLT (ELECTROSURGICAL) ×3
ELECTRODE REM PT RTRN 9FT ADLT (ELECTROSURGICAL) ×2 IMPLANT
EVACUATOR 1/8 PVC DRAIN (DRAIN) IMPLANT
FACESHIELD LNG OPTICON STERILE (SAFETY) IMPLANT
GLOVE BIOGEL PI IND STRL 7.5 (GLOVE) IMPLANT
GLOVE BIOGEL PI INDICATOR 7.5 (GLOVE)
GLOVE ECLIPSE 7.0 STRL STRAW (GLOVE) IMPLANT
GLOVE ECLIPSE 8.5 STRL (GLOVE) ×3 IMPLANT
GLOVE SURG 8.5 LATEX PF (GLOVE) ×3 IMPLANT
GOWN PREVENTION PLUS LG XLONG (DISPOSABLE) IMPLANT
GOWN PREVENTION PLUS XXLARGE (GOWN DISPOSABLE) ×3 IMPLANT
GOWN STRL NON-REIN LRG LVL3 (GOWN DISPOSABLE) ×3 IMPLANT
GOWN STRL REIN XL XLG (GOWN DISPOSABLE) IMPLANT
KIT BASIN OR (CUSTOM PROCEDURE TRAY) ×3 IMPLANT
KIT ROOM TURNOVER OR (KITS) ×3 IMPLANT
MANIFOLD NEPTUNE II (INSTRUMENTS) ×3 IMPLANT
NS IRRIG 1000ML POUR BTL (IV SOLUTION) ×3 IMPLANT
PACK GENERAL/GYN (CUSTOM PROCEDURE TRAY) ×3 IMPLANT
PACK ORTHO EXTREMITY (CUSTOM PROCEDURE TRAY) ×3 IMPLANT
PAD ARMBOARD 7.5X6 YLW CONV (MISCELLANEOUS) ×6 IMPLANT
PAD CAST 4YDX4 CTTN HI CHSV (CAST SUPPLIES) ×2 IMPLANT
PADDING CAST COTTON 4X4 STRL (CAST SUPPLIES) ×3
PADDING CAST COTTON 6X4 STRL (CAST SUPPLIES) ×3 IMPLANT
SAW GIGLI STERILE 20 (MISCELLANEOUS) ×3 IMPLANT
SOL PREP POV-IOD 16OZ 10% (MISCELLANEOUS) ×3 IMPLANT
SPONGE GAUZE 4X4 12PLY (GAUZE/BANDAGES/DRESSINGS) ×6 IMPLANT
SPONGE LAP 18X18 X RAY DECT (DISPOSABLE) ×6 IMPLANT
STAPLER VISISTAT (STAPLE) IMPLANT
STAPLER VISISTAT 35W (STAPLE) IMPLANT
STOCKINETTE IMPERVIOUS LG (DRAPES) ×3 IMPLANT
SUT ETHILON 3 0 PS 1 (SUTURE) ×3 IMPLANT
SUT PROLENE 3 0 PS 2 (SUTURE) ×9 IMPLANT
SUT SILK 0 TIES 10X30 (SUTURE) ×3 IMPLANT
SUT SILK 2 0 (SUTURE)
SUT SILK 2 0 SH CR/8 (SUTURE) ×3 IMPLANT
SUT SILK 2-0 18XBRD TIE 12 (SUTURE) IMPLANT
SUT VIC AB 0 CT1 27 (SUTURE) ×3
SUT VIC AB 0 CT1 27XBRD ANBCTR (SUTURE) ×2 IMPLANT
SUT VIC AB 1 CT1 27 (SUTURE) ×3
SUT VIC AB 1 CT1 27XBRD ANBCTR (SUTURE) ×2 IMPLANT
SUT VIC AB 1 CT1 36 (SUTURE) IMPLANT
SUT VIC AB 2-0 CT1 27 (SUTURE) ×3
SUT VIC AB 2-0 CT1 27XBRD (SUTURE) IMPLANT
SUT VIC AB 2-0 CT1 TAPERPNT 27 (SUTURE) ×2 IMPLANT
TOWEL OR 17X24 6PK STRL BLUE (TOWEL DISPOSABLE) ×3 IMPLANT
TOWEL OR 17X26 10 PK STRL BLUE (TOWEL DISPOSABLE) ×3 IMPLANT
TUBE ANAEROBIC SPECIMEN COL (MISCELLANEOUS) ×12 IMPLANT
WATER STERILE IRR 1000ML POUR (IV SOLUTION) ×3 IMPLANT

## 2013-03-15 NOTE — Op Note (Addendum)
03/13/2013 - 03/15/2013  10:42 AM  PATIENT:  [name].  67 y.o. male  MRN: [id number]  OPERATIVE REPORT  PRE-OPERATIVE DIAGNOSIS:  Osteomyelitis right fifth toe metatarsophalangeal joint. Non healing ulcer left BKA end of residual limb. POST-OPERATIVE DIAGNOSIS:   Osteomyelitis right fifth toe metatarsophalangeal joint. Non healing ulcer left BKA end of residual limb. Bursa distal left BKA stump.   PROCEDURE:  Procedure(s): AMPUTATION BELOW KNEE REVISION WITH EXCISION OF DISTAL STUMP BURSA. AMPUTATION DIGIT    SURGEON:  Jessy Oto, MD     ANESTHESIA:  General, Dr. Oletta Lamas.    COMPLICATIONS:  None.   DRAINS: Penrose drain in the lateral proximal fifth toe incision   LOCAL MEDICATIONS USED:  NONE  SPECIMEN:  Source of Specimen:  Soft tissue from left little toe MTP joint sent for C&S, Swab of left distal tibia bursa cavity sent for C&S.  DISPOSITION OF SPECIMEN:  microbiology  COUNTS:  YES  TOURNIQUET:   Total Tourniquet Time Documented: Thigh (Left) - 40 minutes Total: Thigh (Left) - 40 minutes      PROCEDURE:The patient was met in the holding area, and the appropriate right foot identified and marked with an "X" and my initials. Left BKA stump also marked with x and my initials. The patient was then transported to OR and was placed on the operative table in a supine position. The patient was then placed under general anesthesia without difficulty. The patient received no preoperative antibiotic prophylaxis in order to obtain culture.  Tourniquet was applied to both operative thighs. Left leg was then prepped using sterile conditions and draped using sterile technique with duraprep. Then the right leg was then prepped using sterile conditions and draped using sterile technique betadiene scrub and prep. Time-out procedure was called and correct . The left leg was elevated to and held for 45 seconds to allow for exsanguination and the left upper thigh tourniquet  inflated to 350 mm mercury. An elliptical incision was then made resecting the open ulcer wound and this was continued deep down to the left distal tibia. The bursa cavity was entered with approximately 7-8 cc of cloudy yellow bursa fluid. The bursa cavity was then resected sharply using 10 blade scalpel and this was resected back to the distal tibia the periosteum left intact. Exposure of the distal inch and a half of the tibia was then performed sharply using a 10 blade scalpel and a Gigli saw then used to cut the distal tibia transversely beveling the anterior surface. Carefully the end of the tibia bone was rounded and smoothed using combination of bone cutters and rasps. When this was completed irrigation was carried out using copious amounts of normal saline solution. Bone quality for distal tibia was excellent. Cultures were obtained from the resected head of the bursa cavity. These were sent for both aerobic and anaerobic culture and sensitivities with stat Gram stain. The edges of the incision were then carefully contoured to allow for loose approximation of the skin edges. Deep subcutaneous layers approximated with 3 2-0 Vicryl sutures. Skin closed with interrupted vertical mattress sutures of 4-0 Prolene. Adaptic 4 x 4's ABDs pad fixed the skin with sterile Webril then Coban. Tourniquet was then released. Attention then turned to the right lower extremity.   No tourniquet was inflated for the right foot portion of the case.. The right foot was examined and found to be cellulitic over the lateral aspect of the foot with a open abscessed wound over the lateral  aspect l aspect of the right fifth toe near the metatarsophalangeal joint. This was purulent appearing and appeared to extend to the metatarsophalangeal joint so that a distal fifth ray deletion was necessary. A racquet type incision was then made about the base of the fifth digit extending along the medial aspect of the distal one third right fifth  toe metatarsal ellipsing the abscess along the distal portions of the fifth digit metatarsal. Incision through skin and subcutaneous layers directly down to the lateral aspect of metatarsal distal one third and about the  fifth toe proximal phalanx. This was then carried along the medial aspect of the little toe metatarsophalangeal joint incising the capsule and then circumferentially about the base of the proximal phalanx and removing the little toe as a separate specimen in addition to the ellipsed portion of the abscess wound.  Incision was then carried over the dorsal and plantar aspects of the metatarsal proximally to about the level of the distal one third diaphyseal metaphyseal junction the fifth metatarsal. A bone cutter was then used to cut through the lateral cortex of the distal metaphyseal portion  fifth beveling plantar proximally the remaining bone. It was then carefully examined note cultures were obtained after removal of the toe specimen using anaerobic and aerobic swabs and this included small portion of tissue removed from the joint lining at the MTP joint.. Irrigation was then carried out using saline solution and then at 250 cc of double antibiotic solution. It was really and the tourniquet then removed this patient had significant venous bleeding present.  Following irrigation and then the proximal aspect of the incision was closed with interrupted vertical mattress sutures of 3-0 Prolene. The plantar distal aspect the incision was approximated with simple sutures of 3-0 Prolene. The area of the excised abscessed wound was packed with saline soaked 4 x 4. Small amount of sponge was placed over the open area over the lateral aspect of the base of the fourth toe. Penrose drain quarter inch was then inserted through the incision site at the base of the amputation site incision laterally. 4 x 4's and ABDs fixed with Kerlix 4 inch ace wrap applied. Patient then reactivated extubated returned to his  bed in the recovery room in satisfactory condition all instrument and sponge counts were correct.  Specimens: Right little toe with fifth metatarsophalangeal right distal one third metatarsal. The fifth metatarsal resected at the level of the distal one third metatarsal diaphysis. Swabs sent for culture and sensitivity anaerobic and aerobic and stat Gram stain.     NITKA,JAMES E  03/15/2013, 10:42 AM

## 2013-03-15 NOTE — Transfer of Care (Signed)
Immediate Anesthesia Transfer of Care Note  Patient: [name].  Procedure(s) Performed: Procedure(s) with comments: AMPUTATION BELOW KNEE (Left) - Revision of Left Below Knee Amputation AMPUTATION DIGIT (Right) - Amputation of fifth toe Right foot  Patient Location: PACU  Anesthesia Type:General  Level of Consciousness: awake, alert  and oriented  Airway & Oxygen Therapy: Patient Spontanous Breathing and Patient connected to nasal cannula oxygen  Post-op Assessment: Report given to PACU RN and Post -op Vital signs reviewed and stable  Post vital signs: Reviewed and stable  Complications: No apparent anesthesia complications

## 2013-03-15 NOTE — Progress Notes (Signed)
[facility] PROGRESS NOTE  [name]. [id number] DOB: 05-14-1945 DOA: 03/13/2013 PCP: [name], MD  Assessment/Plan: Principal Problem:   Cellulitis in diabetic foot/acute osteomyelitis - Orthopedic surgeon on board. Thank them further assistance in this case. Wound cultures sent and we are awaiting results. We'll plan on tailoring antibiotic regimen once results are in - Continue broad-spectrum antibiotics - Patient is status post revision of left BKA as well as little toe amputation at the distal metatarsal of the right foot  Active Problems:   DM2 (diabetes mellitus, type 2) -  Diabetic diet - Continue Lantus and sliding scale insulin    Right foot ulcer - As mentioned above Wound care recommendations per orthopedic surgeon    Hypertension -Currently on carvedilol, relatively well controlled on this regimen    Chronic diastolic heart failure - Stable continue home regimen patient currently compensated    Hyperglycemia - Secondary to diabetes and currently well controlled on current insulin regimen.    AKI (acute kidney injury)  We'll follow serum creatinine levels daily.  Code Status: full Family Communication: Discussed with patient and spouse  Disposition Plan: Pending further recommendations from orthopedic surgeon and wound culture results.   Consultants:  Orthopaedic surgeon  Procedures:  As listed above.  Antibiotics:  Cipro and vancomycin  HPI/Subjective: Patient has no new complaints today. Currently feeling better  Objective: Filed Vitals:   03/15/13 1326  BP: 126/56  Pulse: 67  Temp: 97.5 F (36.4 C)  Resp: 18    Intake/Output Summary (Last 24 hours) at 03/15/13 1559 Last data filed at 03/15/13 1300  Gross per 24 hour  Intake 4431.67 ml  Output    880 ml  Net 3551.67 ml   Filed Weights   03/14/13 0206  Weight: 71.759 kg (158 lb 3.2 oz)    Exam:   General:  Pt in NAD, alert and  awake  Cardiovascular: RRR, no MRG  Respiratory: CTA BL, no wheezes  Abdomen: soft, NT, ND  Musculoskeletal: no active bleeding from L BKA or R foot. Guaze in place and right foot wrapped.   Data Reviewed: Basic Metabolic Panel:  Recent Labs Lab 03/13/13 2123 03/14/13 0535  NA 135 140  K 5.3* 4.9  CL 103 110  CO2 21 19  GLUCOSE 271* 191*  BUN 55* 54*  CREATININE 2.76* 2.70*  CALCIUM 8.9 8.7   Liver Function Tests:  Recent Labs Lab 03/13/13 2123  AST 17  ALT 12  ALKPHOS 67  BILITOT 0.3  PROT 7.4  ALBUMIN 2.5*   No results found for this basename: LIPASE, AMYLASE,  in the last 168 hours No results found for this basename: AMMONIA,  in the last 168 hours CBC:  Recent Labs Lab 03/13/13 2123 03/14/13 0535  WBC 11.4* 8.7  NEUTROABS 8.5*  --   HGB 8.0* 7.5*  HCT 24.2* 22.5*  MCV 84.9 84.9  PLT 232 227   Cardiac Enzymes: No results found for this basename: CKTOTAL, CKMB, CKMBINDEX, TROPONINI,  in the last 168 hours BNP (last 3 results)  Recent Labs  11/17/12 0016 11/29/12 1740  PROBNP 4406.0* 7199.0*   CBG:  Recent Labs Lab 03/15/13 0010 03/15/13 0408 03/15/13 0743 03/15/13 1027 03/15/13 1130  GLUCAP 154* 103* 80 75 83    Recent Results (from the past 240 hour(s))  WOUND CULTURE     Status: None   Collection Time    03/13/13 10:49 PM      Result Value Range Status   Specimen  Description WOUND FOOT RIGHT   Final   Special Requests NONE   Final   Gram Stain     Final   Value: MODERATE WBC PRESENT, PREDOMINANTLY PMN     MODERATE SQUAMOUS EPITHELIAL CELLS PRESENT     MODERATE GRAM NEGATIVE RODS     MODERATE GRAM POSITIVE RODS     MODERATE GRAM POSITIVE COCCI   Culture     Final   Value: ABUNDANT GRAM NEGATIVE RODS     Performed at [facility]   Report Status PENDING   Incomplete  WOUND CULTURE     Status: None   Collection Time    03/13/13 10:55 PM      Result Value Range Status   Specimen Description WOUND LEG LEFT STUMP    Final   Special Requests NONE   Final   Gram Stain     Final   Value: ABUNDANT WBC PRESENT,BOTH PMN AND MONONUCLEAR     NO SQUAMOUS EPITHELIAL CELLS SEEN     MODERATE GRAM NEGATIVE RODS     MODERATE GRAM POSITIVE RODS     FEW GRAM POSITIVE COCCI   Culture     Final   Value: Culture reincubated for better growth     Performed at [facility]   Report Status PENDING   Incomplete  MRSA PCR SCREENING     Status: None   Collection Time    03/15/13  4:10 AM      Result Value Range Status   MRSA by PCR NEGATIVE  NEGATIVE Final   Comment:            The GeneXpert MRSA Assay (FDA     approved for NASAL specimens     only), is one component of a     comprehensive MRSA colonization     surveillance program. It is not     intended to diagnose MRSA     infection nor to guide or     monitor treatment for     MRSA infections.  GRAM STAIN     Status: None   Collection Time    03/15/13  9:16 AM      Result Value Range Status   Specimen Description WOUND   Final   Special Requests PATIENT ON FOLLOWING CIPRO AND VANCO LEFT STUMP   Final   Gram Stain     Final   Value: FEW WBC PRESENT,BOTH PMN AND MONONUCLEAR     NO ORGANISMS SEEN     CALLED TO C.SATTERFIELD,RN 1005 03/15/13 M.CAMPBELL   Report Status 03/15/2013 FINAL   Final  GRAM STAIN     Status: None   Collection Time    03/15/13  9:57 AM      Result Value Range Status   Specimen Description WOUND   Final   Special Requests     Final   Value: PATIENT ON FOLLOWING CIPRO AND VANCO RIGHT FIFTH TOE   Gram Stain     Final   Value: RARE WBC PRESENT, PREDOMINANTLY MONONUCLEAR     RARE GRAM POSITIVE COCCI IN PAIRS     CALLED TO COLLINS,S RN 03/15/13 1110 [place]   Report Status 03/15/2013 FINAL   Final     Studies: Dg Tibia/fibula Left  03/14/2013   CLINICAL DATA:  Soft tissue ulcer at the and of the patient's stump. Patient is status post BKA  EXAM: LEFT TIBIA AND FIBULA - 2 VIEW  COMPARISON:  None.  FINDINGS: Patient is  status post BKA. The osseous structures demonstrate no evidence of fracture, dislocation, nor cortical irregularity. There is no evidence of subcutaneous emphysema. Atherosclerotic calcifications are identified.  IMPRESSION: No evidence of acute osseous abnormalities. There no secondary signs raise concern of osteomyelitis.   Electronically Signed   By: Margaree Mackintosh M.D.   On: 03/14/2013 15:37   Dg Foot 2 Views Right  03/14/2013   CLINICAL DATA:  Heel soreness, possible osteomyelitis.  EXAM: RIGHT FOOT - 2 VIEW  COMPARISON:  None.  FINDINGS: Vascular calcifications are noted suggesting diabetes. Degenerative changes are seen involving the intertarsal joints. Status post amputation of the 2nd, 3rd, 4th and 5th toes, as well as the distal portions of the 3rd and 4th metatarsals. There is noted lucency involving the distal 5th metatarsal and 5th proximal phalanx suggesting osteomyelitis.  IMPRESSION: Status post surgical amputation of the 2nd, 3rd, 4th and 5th toes. Probable lucency in lytic destruction is seen involving the distal 5th metatarsal and 5th proximal phalanx suggesting acute osteomyelitis.   Electronically Signed   By: Sabino Dick M.D.   On: 03/14/2013 09:21    Scheduled Meds: . aspirin  325 mg Oral Daily  . atorvastatin  20 mg Oral Daily  . carvedilol  6.25 mg Oral BID WC  . [START ON 03/16/2013] ciprofloxacin  400 mg Intravenous Q24H  . docusate sodium  100 mg Oral BID  . heparin  5,000 Units Subcutaneous Q8H  . insulin aspart  0-15 Units Subcutaneous Q4H  . insulin glargine  10 Units Subcutaneous QHS  . latanoprost  1 drop Both Eyes QHS  . levETIRAcetam  500 mg Oral BID  . tamsulosin  0.4 mg Oral QPC supper  . timolol  1 drop Both Eyes q morning - 10a  . vancomycin  750 mg Intravenous Q24H   Continuous Infusions: . sodium chloride 100 mL/hr at 03/15/13 1558      Time spent: > 35 minutes    [name]  [facility] Pager [phone]. If 7PM-7AM, please contact  night-coverage at [email or web address], password [facility] 03/15/2013, 3:59 PM  LOS: 2 days

## 2013-03-15 NOTE — Brief Op Note (Addendum)
03/13/2013 - 03/15/2013  10:34 AM  PATIENT:  [name].  67 y.o. male  PRE-OPERATIVE DIAGNOSIS:  Osteomyelitis right fifth toe metatarsophalangeal joint. Non healing ulcer left BKA end of residual limb.  POST-OPERATIVE DIAGNOSIS:  Osteomyelitis right fifth toe metatarsophalangeal joint. Non healing ulcer left BKA end of residual limb. Bursa distal left BKA stump.    PROCEDURE:  Procedure(s) with comments: AMPUTATION BELOW KNEE (Left) - Revision of Left Below Knee Amputation AMPUTATION DIGIT (Right) - Amputation of fifth toe Right foot at the distal metatarsal level.  SURGEON:  Surgeon(s) and Role:  Jessy Oto, MD - Primary  ANESTHESIA:   general and Dr. Oletta Lamas.  EBL:  Total I/O In: 800 [I.V.:800] Out: 300 [Urine:300]  BLOOD ADMINISTERED:none  DRAINS: Penrose drain in the lateral proximal fifth toe incision   LOCAL MEDICATIONS USED:  NONE  SPECIMEN:  Source of Specimen:  Soft tissue from left little toe MTP joint sent for C&S, Swab of left distal tibia bursa cavity sent for C&S.  DISPOSITION OF SPECIMEN:  microbiology  COUNTS:  YES  TOURNIQUET:   Total Tourniquet Time Documented: Thigh (Left) - 40 minutes Total: Thigh (Left) - 40 minutes   DICTATION: .Viviann Spare Dictation  PLAN OF CARE: Admit to inpatient   PATIENT DISPOSITION:  PACU - hemodynamically stable.   Delay start of Pharmacological VTE agent (>24hrs) due to surgical blood loss or risk of bleeding: no

## 2013-03-15 NOTE — Preoperative (Signed)
Beta Blockers   Reason not to administer Beta Blockers:Not Applicable 

## 2013-03-15 NOTE — Anesthesia Postprocedure Evaluation (Signed)
  Anesthesia Post-op Note  Patient: [name].  Procedure(s) Performed: Procedure(s) with comments: AMPUTATION BELOW KNEE (Left) - Revision of Left Below Knee Amputation AMPUTATION DIGIT (Right) - Amputation of fifth toe Right foot  Patient Location: PACU  Anesthesia Type:General  Level of Consciousness: awake  Airway and Oxygen Therapy: Patient Spontanous Breathing  Post-op Pain: mild  Post-op Assessment: Post-op Vital signs reviewed  Post-op Vital Signs: Reviewed  Complications: No apparent anesthesia complications

## 2013-03-15 NOTE — H&P (Signed)
Patient was seen and examined in the preop holding area. There has been no interval  Change in this patient's exam preop  history and physical exam  Lab tests and images have been examined and reviewed.  The Risks benefits and alternative treatments have been discussed  extensively,questions answered.  The patient has elected to undergo the discussed surgical treatment. 

## 2013-03-15 NOTE — Anesthesia Preprocedure Evaluation (Addendum)
Anesthesia Evaluation  Patient identified by MRN, date of birth, ID band Patient awake    Reviewed: Allergy & Precautions, H&P , NPO status , Patient's Chart, lab work & pertinent test results, reviewed documented beta blocker date and time   History of Anesthesia Complications Negative for: history of anesthetic complications  Airway Mallampati: II      Dental   Pulmonary pneumonia -, resolved,          Cardiovascular hypertension, + Peripheral Vascular Disease     Neuro/Psych Seizures -, Well Controlled,   Neuromuscular disease CVA    GI/Hepatic negative GI ROS, Neg liver ROS,   Endo/Other  diabetes, Insulin Dependent  Renal/GU Renal InsufficiencyRenal disease     Musculoskeletal   Abdominal   Peds  Hematology  (+) anemia ,   Anesthesia Other Findings   Reproductive/Obstetrics                         Anesthesia Physical Anesthesia Plan  ASA: III  Anesthesia Plan: General   Post-op Pain Management:    Induction: Intravenous  Airway Management Planned: LMA  Additional Equipment:   Intra-op Plan:   Post-operative Plan:   Informed Consent:   Plan Discussed with: CRNA, Anesthesiologist and Surgeon  Anesthesia Plan Comments:         Anesthesia Quick Evaluation

## 2013-03-16 DIAGNOSIS — L97909 Non-pressure chronic ulcer of unspecified part of unspecified lower leg with unspecified severity: Secondary | ICD-10-CM

## 2013-03-16 DIAGNOSIS — I10 Essential (primary) hypertension: Secondary | ICD-10-CM

## 2013-03-16 LAB — BASIC METABOLIC PANEL
BUN: 33 mg/dL — ABNORMAL HIGH (ref 6–23)
CO2: 19 mEq/L (ref 19–32)
Chloride: 109 mEq/L (ref 96–112)
Creatinine, Ser: 2.16 mg/dL — ABNORMAL HIGH (ref 0.50–1.35)
Glucose, Bld: 141 mg/dL — ABNORMAL HIGH (ref 70–99)
Potassium: 5.2 mEq/L — ABNORMAL HIGH (ref 3.5–5.1)

## 2013-03-16 LAB — POTASSIUM: Potassium: 4.9 mEq/L (ref 3.5–5.1)

## 2013-03-16 LAB — IRON AND TIBC
Iron: 26 ug/dL — ABNORMAL LOW (ref 42–135)
Saturation Ratios: 16 % — ABNORMAL LOW (ref 20–55)
TIBC: 158 ug/dL — ABNORMAL LOW (ref 215–435)
UIBC: 132 ug/dL (ref 125–400)

## 2013-03-16 LAB — GLUCOSE, CAPILLARY
Glucose-Capillary: 150 mg/dL — ABNORMAL HIGH (ref 70–99)
Glucose-Capillary: 157 mg/dL — ABNORMAL HIGH (ref 70–99)
Glucose-Capillary: 118 mg/dL — ABNORMAL HIGH (ref 70–99)

## 2013-03-16 LAB — WOUND CULTURE

## 2013-03-16 MED ORDER — SODIUM CHLORIDE 0.9 % IV SOLN
1.0000 g | INTRAVENOUS | Status: DC
  Administered 2013-03-16 – 2013-03-17 (×2): 1 g via INTRAVENOUS
  Filled 2013-03-16 (×2): qty 1

## 2013-03-16 MED ORDER — VANCOMYCIN HCL IN DEXTROSE 1-5 GM/200ML-% IV SOLN
1000.0000 mg | INTRAVENOUS | Status: DC
  Administered 2013-03-17: 1000 mg via INTRAVENOUS
  Filled 2013-03-16 (×2): qty 200

## 2013-03-16 NOTE — Progress Notes (Signed)
[facility] PROGRESS NOTE  [name]. [id number] DOB: 1945/07/16 DOA: 03/13/2013 PCP: [name], MD  Assessment/Plan: Principal Problem:   Cellulitis in diabetic foot/acute osteomyelitis - Orthopedic surgeon on board. Thank them further assistance in this case. Wound cultures sent and we are awaiting results. We'll plan on tailoring antibiotic regimen once results are in - Continue broad-spectrum antibiotics - Patient is status post revision of left BKA as well as little toe amputation at the distal metatarsal of the right foot - We'll cultures reviewed with the pharmacist patient growing staph aureus and Escherichia coli. We'll continue vancomycin and place on imipenem. - Will consider ID consult next a.m. for recommendations regarding length of antibiotic therapy and preferred agent given wound cultures.  Active Problems:   DM2 (diabetes mellitus, type 2) -  Diabetic diet - Continue Lantus and sliding scale insulin - Blood sugars relatively well controlled on this regimen    Right foot ulcer - As mentioned above Wound care recommendations per orthopedic surgeon    Hypertension -Currently on carvedilol, relatively well controlled on this regimen    Chronic diastolic heart failure - Stable continue home regimen patient currently compensated    AKI (acute kidney injury) Trending down currently with improved oral intake and IV fluid rehydration.  Code Status: full Family Communication: Discussed with patient and spouse  Disposition Plan: Likely DC in the next one to 2 days   Consultants:  Orthopaedic surgeon  Procedures:  As listed above.  Antibiotics:  Cipro>>> 03/16/2013   and vancomycin  Ertapenem 03/16/2013  HPI/Subjective: Patient has no new complaints today. No acute issues reported to me overnight.  Objective: Filed Vitals:   03/16/13 1433  BP: 124/51  Pulse: 73  Temp: 98.3 F (36.8 C)  Resp: 18    Intake/Output  Summary (Last 24 hours) at 03/16/13 1650 Last data filed at 03/16/13 1200  Gross per 24 hour  Intake   2000 ml  Output   1250 ml  Net    750 ml   Filed Weights   03/14/13 0206  Weight: 71.759 kg (158 lb 3.2 oz)    Exam:   General:  Pt in NAD, alert and awake  Cardiovascular: RRR, no MRG  Respiratory: CTA BL, no wheezes  Abdomen: soft, NT, ND  Musculoskeletal: no active bleeding from L BKA or R foot. Guaze in place and right foot wrapped.   Data Reviewed: Basic Metabolic Panel:  Recent Labs Lab 03/13/13 2123 03/14/13 0535 03/16/13 0835  NA 135 140 135  K 5.3* 4.9 5.2*  CL 103 110 109  CO2 21 19 19   GLUCOSE 271* 191* 141*  BUN 55* 54* 33*  CREATININE 2.76* 2.70* 2.16*  CALCIUM 8.9 8.7 8.0*   Liver Function Tests:  Recent Labs Lab 03/13/13 2123  AST 17  ALT 12  ALKPHOS 67  BILITOT 0.3  PROT 7.4  ALBUMIN 2.5*   No results found for this basename: LIPASE, AMYLASE,  in the last 168 hours No results found for this basename: AMMONIA,  in the last 168 hours CBC:  Recent Labs Lab 03/13/13 2123 03/14/13 0535  WBC 11.4* 8.7  NEUTROABS 8.5*  --   HGB 8.0* 7.5*  HCT 24.2* 22.5*  MCV 84.9 84.9  PLT 232 227   Cardiac Enzymes: No results found for this basename: CKTOTAL, CKMB, CKMBINDEX, TROPONINI,  in the last 168 hours BNP (last 3 results)  Recent Labs  11/17/12 0016 11/29/12 1740  PROBNP 4406.0* 7199.0*   CBG:  Recent Labs Lab 03/15/13 2022 03/16/13 0010 03/16/13 0420 03/16/13 1144 03/16/13 1615  GLUCAP 145* 99 97 150* 157*    Recent Results (from the past 240 hour(s))  WOUND CULTURE     Status: None   Collection Time    03/13/13 10:49 PM      Result Value Range Status   Specimen Description WOUND FOOT RIGHT   Final   Special Requests NONE   Final   Gram Stain     Final   Value: MODERATE WBC PRESENT, PREDOMINANTLY PMN     MODERATE SQUAMOUS EPITHELIAL CELLS PRESENT     MODERATE GRAM NEGATIVE RODS     MODERATE GRAM POSITIVE RODS      MODERATE GRAM POSITIVE COCCI   Culture     Final   Value: ABUNDANT ESCHERICHIA COLI     Performed at [facility]   Report Status 03/16/2013 FINAL   Final   Organism ID, Bacteria ESCHERICHIA COLI   Final  WOUND CULTURE     Status: None   Collection Time    03/13/13 10:55 PM      Result Value Range Status   Specimen Description WOUND LEG LEFT STUMP   Final   Special Requests NONE   Final   Gram Stain     Final   Value: ABUNDANT WBC PRESENT,BOTH PMN AND MONONUCLEAR     NO SQUAMOUS EPITHELIAL CELLS SEEN     MODERATE GRAM NEGATIVE RODS     MODERATE GRAM POSITIVE RODS     FEW GRAM POSITIVE COCCI   Culture     Final   Value: ABUNDANT STAPHYLOCOCCUS AUREUS     Note: RIFAMPIN AND GENTAMICIN SHOULD NOT BE USED AS SINGLE DRUGS FOR TREATMENT OF STAPH INFECTIONS.     Performed at [facility]   Report Status PENDING   Incomplete  MRSA PCR SCREENING     Status: None   Collection Time    03/15/13  4:10 AM      Result Value Range Status   MRSA by PCR NEGATIVE  NEGATIVE Final   Comment:            The GeneXpert MRSA Assay (FDA     approved for NASAL specimens     only), is one component of a     comprehensive MRSA colonization     surveillance program. It is not     intended to diagnose MRSA     infection nor to guide or     monitor treatment for     MRSA infections.  GRAM STAIN     Status: None   Collection Time    03/15/13  9:16 AM      Result Value Range Status   Specimen Description WOUND   Final   Special Requests PATIENT ON FOLLOWING CIPRO AND VANCO LEFT STUMP   Final   Gram Stain     Final   Value: FEW WBC PRESENT,BOTH PMN AND MONONUCLEAR     NO ORGANISMS SEEN     CALLED TO C.SATTERFIELD,RN 1005 03/15/13 M.CAMPBELL   Report Status 03/15/2013 FINAL   Final  ANAEROBIC CULTURE     Status: None   Collection Time    03/15/13  9:16 AM      Result Value Range Status   Specimen Description WOUND   Final   Special Requests PATIENT ON FOLLOWING CIPRO AND VANCO  LEFT STUMP   Final   Gram Stain     Final  Value: RARE WBC NO SQUAMOUS EPITHELIAL CELLS SEEN     RARE GRAM POSITIVE COCCI     IN PAIRS     Performed at [facility]   Culture     Final   Value: NO ANAEROBES ISOLATED; CULTURE IN PROGRESS FOR 5 DAYS     Performed at [facility]   Report Status PENDING   Incomplete  WOUND CULTURE     Status: None   Collection Time    03/15/13  9:16 AM      Result Value Range Status   Specimen Description WOUND   Final   Special Requests PATIENT ON FOLLOWING CIPRO AND VANCO LEFT STUMP   Final   Gram Stain     Final   Value: FEW WBC PRESENT,BOTH PMN AND MONONUCLEAR     NO SQUAMOUS EPITHELIAL CELLS SEEN     NO ORGANISMS SEEN     Gram Stain Report Called to,Read Back By and Verified With: Gram Stain Report Called to,Read Back By and Verified With: C SATTERFIELD RN 1005 [phone] M CAMPBELL Performed at [facility]     Performed at [facility]   Culture     Final   Value: NO GROWTH 1 DAY     Performed at [facility]   Report Status PENDING   Incomplete  GRAM STAIN     Status: None   Collection Time    03/15/13  9:57 AM      Result Value Range Status   Specimen Description WOUND   Final   Special Requests     Final   Value: PATIENT ON FOLLOWING CIPRO AND VANCO RIGHT FIFTH TOE   Gram Stain     Final   Value: RARE WBC PRESENT, PREDOMINANTLY MONONUCLEAR     RARE GRAM POSITIVE COCCI IN PAIRS     CALLED TO COLLINS,S RN 03/15/13 [place]   Report Status 03/15/2013 FINAL   Final  ANAEROBIC CULTURE     Status: None   Collection Time    03/15/13  9:57 AM      Result Value Range Status   Specimen Description WOUND   Final   Special Requests     Final   Value: PATIENT ON FOLLOWING CIPRO AND VANCO RIGHT FIFTH TOE   Gram Stain     Final   Value: RARE WBC PRESENT, PREDOMINANTLY MONONUCLEAR     RARE GRAM POSITIVE COCCI IN PAIRS     CALLED TO Theda Sers S RN 03/15/13 1110 [place] Performed at [facility]      Performed at [facility]   Culture     Final   Value: NO ANAEROBES ISOLATED; CULTURE IN PROGRESS FOR 5 DAYS     Performed at [facility]   Report Status PENDING   Incomplete  WOUND CULTURE     Status: None   Collection Time    03/15/13  9:57 AM      Result Value Range Status   Specimen Description WOUND   Final   Special Requests     Final   Value: PATIENT ON FOLLOWING CIPRO AND VANCO RIGHT FIFTH TOE   Gram Stain     Final   Value: RARE WBC PRESENT, PREDOMINANTLY MONONUCLEAR     RARE GRAM POSITIVE COCCI IN PAIRS     CALLED TO Shelia Media RN 03/15/13  1110 [place] K Performed at [facility]     Performed at [facility]  Culture     Final   Value: MODERATE GRAM NEGATIVE RODS     Performed at [facility]   Report Status PENDING   Incomplete     Studies: No results found.  Scheduled Meds: . aspirin  325 mg Oral Daily  . atorvastatin  20 mg Oral Daily  . carvedilol  6.25 mg Oral BID WC  . docusate sodium  100 mg Oral BID  . ertapenem  1 g Intravenous Q24H  . heparin  5,000 Units Subcutaneous Q8H  . insulin aspart  0-15 Units Subcutaneous Q4H  . insulin glargine  10 Units Subcutaneous QHS  . latanoprost  1 drop Both Eyes QHS  . levETIRAcetam  500 mg Oral BID  . tamsulosin  0.4 mg Oral QPC supper  . timolol  1 drop Both Eyes q morning - 10a  . vancomycin  1,000 mg Intravenous Q24H   Continuous Infusions: . sodium chloride 100 mL/hr at 03/16/13 0726      Time spent: > 35 minutes    Velvet Bathe  [facility] Pager [phone]. If 7PM-7AM, please contact night-coverage at [email or web address], password [facility] 03/16/2013, 4:50 PM  LOS: 3 days

## 2013-03-16 NOTE — Progress Notes (Signed)
   CARE MANAGEMENT NOTE 03/16/2013  Patient:  [name], [name]   Account Number:  [id number]  Date Initiated:  03/16/2013  Documentation initiated by:  [facility]  Subjective/Objective Assessment:   adm: Cellulitis in diabetic foot/acute osteomyelitis  status post revision of left BKA as well as little toe amputation at the distal metatarsal of the right foot     Action/Plan:   discharge planning   Anticipated DC Date:  03/18/2013   Anticipated DC Plan:  [place]  CM consult      [facility] Choice  HOME HEALTH   Choice offered to / List presented to:  C-1 Patient        [place] arranged  [place] RN      [place].   Status of service:  In process, will continue to follow Medicare Important Message given?   (If response is "NO", the following Medicare IM given date fields will be blank) Date Medicare IM given:   Date Additional Medicare IM given:    Discharge Disposition:  [place]  Per UR Regulation:    If discussed at Long Length of Stay Meetings, dates discussed:    Comments:  03/16/13 10:36 CM spoke with pt to offer choice.  Pt chooses [facility] for HHPT/OT/RN.  Address and contact numbers were verified.  Referral faxed to [facility].  CM will continue to monitor for probable DME and possible further discharge needs.  Mariane Masters, BSN, CM [phone].

## 2013-03-16 NOTE — Evaluation (Signed)
Physical Therapy Evaluation Patient Details Name: [name]. MRN: [id number] DOB: 05-07-1945 Today's Date: 03/16/2013 Time: [id number] PT Time Calculation (min): 23 min  PT Assessment / Plan / Recommendation History of Present Illness  Amit Zirkel. is an 67 y.o. male with hx of DM2, HTN, Sz, CKD II, s/p BKA left leg, and several toe amputation on right leg, presents to the ER having increase purulent discharge on his right 5th toe.Now s/p R 5th ray amp and L BKA revision  Clinical Impression  Pt admitted with . Pt currently with functional limitations due to the deficits listed below (see PT Problem List).  Pt will benefit from skilled PT to increase their independence and safety with mobility to allow discharge to the venue listed below.       PT Assessment  Patient needs continued PT services    Follow Up Recommendations  Home health PT;Supervision/Assistance - 24 hour    Does the patient have the potential to tolerate intense rehabilitation      Barriers to Discharge        Equipment Recommendations  Rolling walker with 5" wheels;3in1 (PT);Other (comment) (consider drop-arm BSC)    Recommendations for Other Services     Frequency Min 3X/week    Precautions / Restrictions Precautions Precautions: None Required Braces or Orthoses: Other Brace/Splint Other Brace/Splint: WBAT RLE in Darco show Restrictions Weight Bearing Restrictions: Yes RLE Weight Bearing: Weight bearing as tolerated (in [place])   Pertinent Vitals/Pain no apparent distress       Mobility  Bed Mobility Bed Mobility: Supine to Sit;Sitting - Scoot to Edge of Bed Supine to Sit: 5: Supervision Sitting - Scoot to [employer] of Bed: 5: Supervision Details for Bed Mobility Assistance: Smooth transition Transfers Transfers: Lateral/Scoot Transfers Lateral/Scoot Transfers: 4: Min assist;With armrests removed Details for Transfer Assistance: Min assist mostly for [place] chair during transfer; Ortho  tech had delivered [employer]; Pt states he has had issues with his R heel as well, and opted not to bear weight through R foot, even in darco Ambulation/Gait Ambulation/Gait Assistance: Not tested (comment)    Exercises     PT Diagnosis: Generalized weakness  PT Problem List: Decreased activity tolerance;Decreased mobility;Decreased knowledge of use of DME;Pain PT Treatment Interventions: DME instruction;Functional mobility training;Therapeutic activities;Therapeutic exercise;Patient/family education     PT Goals(Current goals can be found in the care plan section) Acute Rehab PT Goals Patient Stated Goal: back to prosthesis PT Goal Formulation: With patient Time For Goal Achievement: 03/30/13 Potential to Achieve Goals: Good  Visit Information  Last PT Received On: 03/16/13 Assistance Needed: +1 History of Present Illness: [name]. is an 67 y.o. male with hx of DM2, HTN, Sz, CKD II, s/p BKA left leg, and several toe amputation on right leg, presents to the ER having increase purulent discharge on his right 5th toe.       Prior [place] expects to be discharged to:: Private residence Living Arrangements: Spouse/significant other Available Help at Discharge: Family;Available 24 hours/day Type of Home: House Home Access: Ramped entrance Home Layout: One level Home Equipment: Bedside commode;Shower seat;Walker - 2 wheels;Wheelchair - [employer] - 4 wheels Prior Function Level of Independence: Needs assistance Gait / Transfers Assistance Needed: Uses WC for primary mobility and RW with prosthesis for transfers. ADL's / Homemaking Assistance Needed: Wife and Goddaughter assisted pt with prostetic. Communication Communication: No difficulties    Cognition  Cognition Arousal/Alertness: Awake/alert Behavior During Therapy: WFL for tasks  assessed/performed Overall Cognitive Status: Within Functional Limits for tasks assessed     Extremity/Trunk Assessment Upper Extremity Assessment Upper Extremity Assessment: Overall WFL for tasks assessed Lower Extremity Assessment Lower Extremity Assessment: RLE deficits/detail;LLE deficits/detail RLE Deficits / Details: Generallly weak, noted pt tends to lift RLE with hands at thigh LLE Deficits / Details: BKA; good quad set and hamstring length   Balance    End of Session PT - End of Session Activity Tolerance: Patient tolerated treatment well Patient left: in chair;with call bell/phone within reach Nurse Communication: Mobility status  GP     R[facility] 03/16/2013, 4:57 PM [place], [place]

## 2013-03-16 NOTE — Progress Notes (Signed)
Orthopedic Tech Progress Note Patient Details:  [name] Jan 12, 1946 [id number]  Ortho Devices Type of Ortho Device: Darco shoe Ortho Device/Splint Interventions: Application   [name] 03/16/2013, 1:27 PM

## 2013-03-17 ENCOUNTER — Encounter (HOSPITAL_COMMUNITY): Payer: Self-pay | Admitting: Specialist

## 2013-03-17 DIAGNOSIS — A4901 Methicillin susceptible Staphylococcus aureus infection, unspecified site: Secondary | ICD-10-CM

## 2013-03-17 DIAGNOSIS — A498 Other bacterial infections of unspecified site: Secondary | ICD-10-CM

## 2013-03-17 DIAGNOSIS — S88919A Complete traumatic amputation of unspecified lower leg, level unspecified, initial encounter: Secondary | ICD-10-CM

## 2013-03-17 LAB — WOUND CULTURE

## 2013-03-17 LAB — GLUCOSE, CAPILLARY
Glucose-Capillary: 108 mg/dL — ABNORMAL HIGH (ref 70–99)
Glucose-Capillary: 109 mg/dL — ABNORMAL HIGH (ref 70–99)
Glucose-Capillary: 125 mg/dL — ABNORMAL HIGH (ref 70–99)
Glucose-Capillary: 126 mg/dL — ABNORMAL HIGH (ref 70–99)

## 2013-03-17 MED ORDER — SULFAMETHOXAZOLE-TMP DS 800-160 MG PO TABS
1.0000 | ORAL_TABLET | Freq: Two times a day (BID) | ORAL | Status: DC
  Administered 2013-03-17 – 2013-03-18 (×2): 1 via ORAL
  Filled 2013-03-17 (×3): qty 1

## 2013-03-17 MED ORDER — RIFAMPIN 300 MG PO CAPS
300.0000 mg | ORAL_CAPSULE | Freq: Two times a day (BID) | ORAL | Status: DC
  Administered 2013-03-17 – 2013-03-18 (×2): 300 mg via ORAL
  Filled 2013-03-17 (×3): qty 1
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
## 2013-03-17 NOTE — Progress Notes (Signed)
Utilization review completed.  
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
## 2013-03-17 NOTE — Progress Notes (Signed)
[facility] PROGRESS NOTE  [name]. [id number] DOB: [birth year] DOA: 03/13/2013 PCP: [name], MD  Assessment/Plan: Principal Problem:   Cellulitis in diabetic foot/acute osteomyelitis - Orthopedic surgeon on board. Thank them further assistance in this case. Wound cultures sent and we are awaiting results. We'll plan on tailoring antibiotic regimen once results are in - Continue broad-spectrum antibiotics - Patient is status post revision of left BKA as well as little toe amputation at the distal metatarsal of the right foot - We'll cultures reviewed with the pharmacist patient growing staph aureus and Escherichia coli. - Consulted ID for disposition recommendations.  Active Problems:   DM2 (diabetes mellitus, type 2) - Diabetic diet - Continue Lantus and sliding scale insulin - Blood sugars relatively well controlled on this regimen    Right foot ulcer - As mentioned above - Wound care recommendations per orthopedic surgeon    Hypertension -Currently on carvedilol, relatively well controlled on this regimen    Chronic diastolic heart failure - Stable continue home regimen patient currently compensated    AKI (acute kidney injury) - Trending down currently with improved oral intake and IV fluid rehydration.  Code Status: full Family Communication: Discussed with patient and spouse  Disposition Plan: Likely DC in the next one to 2 days   Consultants:  Orthopaedic surgeon  Procedures:  As listed above.  Antibiotics:  Cipro>>> 03/16/2013   and vancomycin  Ertapenem 03/16/2013  HPI/Subjective: Patient has no new complaints today. No acute issues reported to me overnight. He reports feeling better.  Objective: Filed Vitals:   03/17/13 0643  BP: 154/56  Pulse: 74  Temp: 98.7 F (37.1 C)  Resp:     Intake/Output Summary (Last 24 hours) at 03/17/13 1245 Last data filed at 03/17/13 1032  Gross per 24 hour  Intake    360 ml   Output    500 ml  Net   -140 ml   Filed Weights   03/14/13 0206  Weight: 71.759 kg (158 lb 3.2 oz)    Exam:   General:  Pt in NAD, alert and awake  Cardiovascular: RRR, no MRG  Respiratory: CTA BL, no wheezes  Abdomen: soft, NT, ND  Musculoskeletal: no active bleeding from L BKA or R foot. Guaze in place and right foot wrapped.   Data Reviewed: Basic Metabolic Panel:  Recent Labs Lab 03/13/13 2123 03/14/13 0535 03/16/13 0835 03/16/13 1750  NA 135 140 135  --   K 5.3* 4.9 5.2* 4.9  CL 103 110 109  --   CO2 21 19 19   --   GLUCOSE 271* 191* 141*  --   BUN 55* 54* 33*  --   CREATININE 2.76* 2.70* 2.16*  --   CALCIUM 8.9 8.7 8.0*  --    Liver Function Tests:  Recent Labs Lab 03/13/13 2123  AST 17  ALT 12  ALKPHOS 67  BILITOT 0.3  PROT 7.4  ALBUMIN 2.5*   No results found for this basename: LIPASE, AMYLASE,  in the last 168 hours No results found for this basename: AMMONIA,  in the last 168 hours CBC:  Recent Labs Lab 03/13/13 2123 03/14/13 0535  WBC 11.4* 8.7  NEUTROABS 8.5*  --   HGB 8.0* 7.5*  HCT 24.2* 22.5*  MCV 84.9 84.9  PLT 232 227   Cardiac Enzymes: No results found for this basename: CKTOTAL, CKMB, CKMBINDEX, TROPONINI,  in the last 168 hours BNP (last 3 results)  Recent Labs  11/17/12 0016 11/29/12  [place] 4406.0* 7199.0*   CBG:  Recent Labs Lab 03/16/13 1948 03/17/13 0001 03/17/13 0336 03/17/13 0759 03/17/13 1145  GLUCAP 118* 126* 106* 77 109*    Recent Results (from the past 240 hour(s))  WOUND CULTURE     Status: None   Collection Time    03/13/13 10:49 PM      Result Value Range Status   Specimen Description WOUND FOOT RIGHT   Final   Special Requests NONE   Final   Gram Stain     Final   Value: MODERATE WBC PRESENT, PREDOMINANTLY PMN     MODERATE SQUAMOUS EPITHELIAL CELLS PRESENT     MODERATE GRAM NEGATIVE RODS     MODERATE GRAM POSITIVE RODS     MODERATE GRAM POSITIVE COCCI   Culture     Final    Value: ABUNDANT ESCHERICHIA COLI     Performed at [facility]   Report Status 03/16/2013 FINAL   Final   Organism ID, Bacteria ESCHERICHIA COLI   Final  WOUND CULTURE     Status: None   Collection Time    03/13/13 10:55 PM      Result Value Range Status   Specimen Description WOUND LEG LEFT STUMP   Final   Special Requests NONE   Final   Gram Stain     Final   Value: ABUNDANT WBC PRESENT,BOTH PMN AND MONONUCLEAR     NO SQUAMOUS EPITHELIAL CELLS SEEN     MODERATE GRAM NEGATIVE RODS     MODERATE GRAM POSITIVE RODS     FEW GRAM POSITIVE COCCI   Culture     Final   Value: ABUNDANT STAPHYLOCOCCUS AUREUS     Note: RIFAMPIN AND GENTAMICIN SHOULD NOT BE USED AS SINGLE DRUGS FOR TREATMENT OF STAPH INFECTIONS. This organism DOES NOT demonstrate inducible Clindamycin resistance in vitro.     Performed at [facility]   Report Status 03/17/2013 FINAL   Final   Organism ID, Bacteria STAPHYLOCOCCUS AUREUS   Final  MRSA PCR SCREENING     Status: None   Collection Time    03/15/13  4:10 AM      Result Value Range Status   MRSA by PCR NEGATIVE  NEGATIVE Final   Comment:            The GeneXpert MRSA Assay (FDA     approved for NASAL specimens     only), is one component of a     comprehensive MRSA colonization     surveillance program. It is not     intended to diagnose MRSA     infection nor to guide or     monitor treatment for     MRSA infections.  GRAM STAIN     Status: None   Collection Time    03/15/13  9:16 AM      Result Value Range Status   Specimen Description WOUND   Final   Special Requests PATIENT ON FOLLOWING CIPRO AND VANCO LEFT STUMP   Final   Gram Stain     Final   Value: FEW WBC PRESENT,BOTH PMN AND MONONUCLEAR     NO ORGANISMS SEEN     CALLED TO C.SATTERFIELD,RN 1005 03/15/13 M.CAMPBELL   Report Status 03/15/2013 FINAL   Final  ANAEROBIC CULTURE     Status: None   Collection Time    03/15/13  9:16 AM      Result Value Range Status   Specimen  Description  WOUND   Final   Special Requests PATIENT ON FOLLOWING CIPRO AND VANCO LEFT STUMP   Final   Gram Stain     Final   Value: RARE WBC NO SQUAMOUS EPITHELIAL CELLS SEEN     RARE GRAM POSITIVE COCCI     IN PAIRS     Performed at [facility]   Culture     Final   Value: NO ANAEROBES ISOLATED; CULTURE IN PROGRESS FOR 5 DAYS     Performed at [facility]   Report Status PENDING   Incomplete  WOUND CULTURE     Status: None   Collection Time    03/15/13  9:16 AM      Result Value Range Status   Specimen Description WOUND   Final   Special Requests PATIENT ON FOLLOWING CIPRO AND VANCO LEFT STUMP   Final   Gram Stain     Final   Value: FEW WBC PRESENT,BOTH PMN AND MONONUCLEAR     NO SQUAMOUS EPITHELIAL CELLS SEEN     NO ORGANISMS SEEN     Gram Stain Report Called to,Read Back By and Verified With: Gram Stain Report Called to,Read Back By and Verified With: C SATTERFIELD RN [phone] M CAMPBELL Performed at [facility]     Performed at [facility]   Culture     Final   Value: FEW DIPHTHEROIDS(CORYNEBACTERIUM SPECIES)     Note: Standardized susceptibility testing for this organism is not available.     Performed at [facility]   Report Status 03/17/2013 FINAL   Final  GRAM STAIN     Status: None   Collection Time    03/15/13  9:57 AM      Result Value Range Status   Specimen Description WOUND   Final   Special Requests     Final   Value: PATIENT ON FOLLOWING CIPRO AND VANCO RIGHT FIFTH TOE   Gram Stain     Final   Value: RARE WBC PRESENT, PREDOMINANTLY MONONUCLEAR     RARE GRAM POSITIVE COCCI IN PAIRS     CALLED TO COLLINS,S RN 03/15/13 1110 [place]   Report Status 03/15/2013 FINAL   Final  ANAEROBIC CULTURE     Status: None   Collection Time    03/15/13  9:57 AM      Result Value Range Status   Specimen Description WOUND   Final   Special Requests     Final   Value: PATIENT ON FOLLOWING CIPRO AND VANCO RIGHT FIFTH TOE   Gram  Stain     Final   Value: RARE WBC PRESENT, PREDOMINANTLY MONONUCLEAR     RARE GRAM POSITIVE COCCI IN PAIRS     CALLED TO Shelia Media RN 03/15/13 1110 WOOTEN K Performed at [facility]     Performed at [facility]   Culture     Final   Value: NO ANAEROBES ISOLATED; CULTURE IN PROGRESS FOR 5 DAYS     Performed at [facility]   Report Status PENDING   Incomplete  WOUND CULTURE     Status: None   Collection Time    03/15/13  9:57 AM      Result Value Range Status   Specimen Description WOUND   Final   Special Requests     Final   Value: PATIENT ON FOLLOWING CIPRO AND VANCO RIGHT FIFTH TOE   Gram Stain     Final   Value:  RARE WBC PRESENT, PREDOMINANTLY MONONUCLEAR     RARE GRAM POSITIVE COCCI IN PAIRS     CALLED TO Shelia Media RN 03/15/13  1110 WOOTEN K Performed at [facility]     Performed at [facility]   Culture     Final   Value: MODERATE ESCHERICHIA COLI     ABUNDANT STAPHYLOCOCCUS AUREUS     Note: RIFAMPIN AND GENTAMICIN SHOULD NOT BE USED AS SINGLE DRUGS FOR TREATMENT OF STAPH INFECTIONS.     Performed at [facility]   Report Status PENDING   Incomplete   Organism ID, Bacteria ESCHERICHIA COLI   Final     Studies: No results found.  Scheduled Meds: . aspirin  325 mg Oral Daily  . atorvastatin  20 mg Oral Daily  . carvedilol  6.25 mg Oral BID WC  . docusate sodium  100 mg Oral BID  . ertapenem  1 g Intravenous Q24H  . heparin  5,000 Units Subcutaneous Q8H  . insulin aspart  0-15 Units Subcutaneous Q4H  . insulin glargine  10 Units Subcutaneous QHS  . latanoprost  1 drop Both Eyes QHS  . levETIRAcetam  500 mg Oral BID  . tamsulosin  0.4 mg Oral QPC supper  . timolol  1 drop Both Eyes q morning - 10a  . vancomycin  1,000 mg Intravenous Q24H   Continuous Infusions:      Time spent: > 35 minutes    Velvet Bathe  [facility] Pager [phone]. If 7PM-7AM, please contact night-coverage at [email or web address],  password [facility] 03/17/2013, 12:45 PM  LOS: 4 days

## 2013-03-17 NOTE — Progress Notes (Signed)
Awake, alert and oriented x 4. E coli and Staph a idendified in wound culture. Awaiting sensitivity of the Staph organism.  Wounds dressed today and okay to start daily dressing changes right foot instep lateral Little toe open amputation site and heel. Normal saline wet to dry.  Agree with Phillips Hay, PA-C note.

## 2013-03-17 NOTE — Progress Notes (Signed)
Subjective: 2 Days Post-Op Procedure(s) (LRB): AMPUTATION BELOW KNEE (Left) AMPUTATION DIGIT (Right) Patient reports pain as mild.    Objective: Vital signs in last 24 hours: Temp:  [98.3 F (36.8 C)-100.6 F (38.1 C)] 98.7 F (37.1 C) (12/29 0643) Pulse Rate:  [73-74] 74 (12/29 0643) Resp:  [18-19] 19 (12/28 2123) BP: (124-154)/(51-64) 154/56 mmHg (12/29 0643) SpO2:  [100 %] 100 % (12/29 0643)  Intake/Output from previous day: 12/28 0701 - 12/29 0700 In: 1880 [P.O.:680; I.V.:1200] Out: 750 [Urine:750] Intake/Output this shift: Total I/O In: -  Out: 300 [Urine:300]  No results found for this basename: HGB,  in the last 72 hours No results found for this basename: WBC, RBC, HCT, PLT,  in the last 72 hours  Recent Labs  03/16/13 0835 03/16/13 1750  NA 135  --   K 5.2* 4.9  CL 109  --   CO2 19  --   BUN 33*  --   CREATININE 2.16*  --   GLUCOSE 141*  --   CALCIUM 8.0*  --    No results found for this basename: LABPT, INR,  in the last 72 hours  dressings changed.  left BKA revision incision healing well.  flap soft and no drainage from wound.  Right foot with packing removed from lateral foot.  100% granulation tissue with small amt of bleeding.  No purulence or exudate.  Anterior foot wounds healing well.  Eschar of heel intact and soft.  All areas redressed with saline soaked gauze and lateral foot wound pack with saline soaked gauze as well.  Clean dressing apply and ACE wrap applied lightly to cover foot an dressing. Both placed on pillows with right heel floating.   Assessment/Plan: 2 Days Post-Op Procedure(s) (LRB): AMPUTATION BELOW KNEE (Left) AMPUTATION DIGIT (Right) Discharge home with home health for dressing changes, wound care and abx when medically stable.  Continue daily dressing changes at home by [facility] with saline gauze. OV 1 week from discharge. Continue abx for E coli and staph aureus.    Dangelo Guzzetta M 03/17/2013, 10:48 AM

## 2013-03-17 NOTE — Evaluation (Signed)
Occupational Therapy Evaluation Patient Details Name: [name]. MRN: [id number] DOB: [birth year] Today's Date: 03/17/2013 Time: [id number] OT Time Calculation (min): 35 min  OT Assessment / Plan / Recommendation History of present illness [name]. is an 67 y.o. male with hx of DM2, HTN, Sz, CKD II, s/p BKA left leg, and several toe amputation on right leg, presents to the ER having increase purulent discharge on his right 5th toe.   Clinical Impression   Pt is at min - mod A level with LB ADLs and min guard A with with ADL mobility. Pt has necessary DME at home from previous L BKA. All education completed and no further acute OT services indicated at this time. All further needs can be met with [facility] OT for ADL independence and safety    OT Assessment  All further OT needs can be met in the next venue of care    Follow Up Recommendations  Home health OT;Supervision/Assistance - 24 hour    Barriers to Discharge  none    Equipment Recommendations  Other (comment) Management consultant)    Recommendations for Other Services    Frequency       Precautions / Restrictions Precautions Precautions: None Required Braces or Orthoses: Other Brace/Splint Other Brace/Splint: WBAT RLE in [place] show Restrictions RLE Weight Bearing: Weight bearing as tolerated   Pertinent Vitals/Pain No c/o pain before or after acitivity    ADL  Grooming: Performed;Wash/dry hands;Wash/dry face;Supervision/safety;Set up Where Assessed - Grooming: Unsupported sitting Upper Body Bathing: Simulated;Supervision/safety;Set up Where Assessed - Upper Body Bathing: Unsupported sitting Lower Body Bathing: Simulated;Minimal assistance Where Assessed - Lower Body Bathing: Unsupported sitting;Lean right and/or left Upper Body Dressing: Performed;Supervision/safety;Set up Where Assessed - Upper Body Dressing: Unsupported sitting Lower Body Dressing: Simulated;Moderate assistance Where Assessed - Lower Body Dressing:  Unsupported sitting;Lean right and/or left Toilet Transfer: Simulated;Min guard Toilet Transfer Method: Other (comment) (lateral scoot/transfer) Toileting - Clothing Manipulation and Hygiene: Performed;Supervision/safety Where Assessed - Best boy and Hygiene: Lean right and/or left;Sit on 3-in-1 or toilet Tub/Shower Transfer Method: Not assessed Transfers/Ambulation Related to ADLs: Pt states he has had issues with his R heel as well, and opted not to bear weight through R foot, even in darco. Lateral transfers to drop arm recliner    OT Diagnosis:    OT Problem List: Decreased activity tolerance;Impaired balance (sitting and/or standing) OT Treatment Interventions:     OT Goals(Current goals can be found in the care plan section)    Visit Information  Last OT Received On: 03/17/13 Assistance Needed: +1 History of Present Illness: [name]. is an 67 y.o. male with hx of DM2, HTN, Sz, CKD II, s/p BKA left leg, and several toe amputation on right leg, presents to the ER having increase purulent discharge on his right 5th toe.       Prior [place] expects to be discharged to:: Private residence Living Arrangements: Spouse/significant other Available Help at Discharge: Family;Available 24 hours/day Type of Home: House Home Access: Ramped entrance Home Layout: One level Home Equipment: Bedside commode;Shower seat;Walker - 2 wheels;Wheelchair - [employer] - 4 wheels Prior Function Level of Independence: Needs assistance Gait / Transfers Assistance Needed: Uses WC for primary mobility and RW with prosthesis for transfers. ADL's / Homemaking Assistance Needed: Wife and Goddaughter assisted pt with prostetic. Comments: Pt's bedroom setup at home allows RW to be secured and pt uses UEs to pull up to stand. Pt  uses head resting against wall to assist with standing balance. Pt hops on LLE with RW to transfer.   Communication Communication: No difficulties Dominant Hand: Right         Vision/Perception Vision - History Baseline Vision: Wears glasses only for reading Patient Visual Report: No change from baseline Perception Perception: Within Functional Limits   Cognition  Cognition Arousal/Alertness: Awake/alert Behavior During Therapy: WFL for tasks assessed/performed Overall Cognitive Status: Within Functional Limits for tasks assessed    Extremity/Trunk Assessment Upper Extremity Assessment Upper Extremity Assessment: Overall WFL for tasks assessed Lower Extremity Assessment Lower Extremity Assessment: Defer to PT evaluation Cervical / Trunk Assessment Cervical / Trunk Assessment: Normal     Mobility Bed Mobility Bed Mobility: Supine to Sit;Sitting - Scoot to Edge of Bed Supine to Sit: 5: Supervision Sitting - Scoot to [employer] of Bed: 5: Supervision Transfers Transfers: Not assessed Details for Transfer Assistance: Pt states he has had issues with his R heel as well, and opted not to bear weight through R foot, even in darco. Lateral transfers to drop arm recliner     Exercise     Balance Balance Balance Assessed: Yes Static Sitting Balance Static Sitting - Balance Support: No upper extremity supported;Feet supported Static Sitting - Level of Assistance: 5: Stand by assistance Dynamic Sitting Balance Dynamic Sitting - Balance Support: No upper extremity supported;Feet unsupported;During functional activity Dynamic Sitting - Level of Assistance: 5: Stand by assistance   End of Session OT - End of Session Activity Tolerance: Patient tolerated treatment well Patient left: in chair;with call bell/phone within reach  GO     Britt Bottom 03/17/2013, 1:00 PM

## 2013-03-17 NOTE — Progress Notes (Signed)
Per previous RN dressing to right foot came off and replaced. This AM, blood draining through lateral aspect of foot. Changed dry dressing only, wrapped with kerlix and ace wrap. Confused at times during night, attempting to get up without assistance. Incont of urine X 2. Patrici Ranks A

## 2013-03-18 DIAGNOSIS — D649 Anemia, unspecified: Secondary | ICD-10-CM

## 2013-03-18 LAB — WOUND CULTURE

## 2013-03-18 LAB — GLUCOSE, CAPILLARY
Glucose-Capillary: 105 mg/dL — ABNORMAL HIGH (ref 70–99)
Glucose-Capillary: 138 mg/dL — ABNORMAL HIGH (ref 70–99)
Glucose-Capillary: 143 mg/dL — ABNORMAL HIGH (ref 70–99)

## 2013-03-18 MED ORDER — SULFAMETHOXAZOLE-TMP DS 800-160 MG PO TABS: 1.0000 | ORAL_TABLET | Freq: Two times a day (BID) | ORAL | Status: DC

## 2013-03-18 MED ORDER — TRAMADOL HCL 50 MG PO TABS: 50.0000 mg | ORAL_TABLET | Freq: Four times a day (QID) | ORAL | Status: DC | PRN

## 2013-03-18 MED ORDER — INSULIN GLARGINE 100 UNIT/ML ~~LOC~~ SOLN: 10.0000 [IU] | Freq: Every day | SUBCUTANEOUS | Status: DC

## 2013-03-18 MED ORDER — RIFAMPIN 300 MG PO CAPS: 300.0000 mg | ORAL_CAPSULE | Freq: Two times a day (BID) | ORAL | Status: DC

## 2013-03-18 NOTE — Progress Notes (Signed)
CSW (Clinical Education officer, museum) prepared pt dc packet and placed with shadow chart. Pt wife to provide transport. Pt nurse asked to please help pt dc at 4pm to give facility time to prepare for pt arrival. CSW signing off.  [place], [place]

## 2013-03-18 NOTE — Progress Notes (Signed)
Clinical Social Work Department BRIEF PSYCHOSOCIAL ASSESSMENT 03/18/2013  Patient:  [name], [name]     Account Number:  [id number]     Admit date:  03/13/2013  Clinical Social Worker:  Adair Laundry  Date/Time:  03/18/2013 02:30 PM  Referred by:  Physician  Date Referred:  03/18/2013 Referred for  SNF Placement   Other Referral:   Interview type:  Patient Other interview type:   Spoke with pt and pt wife at bedside    PSYCHOSOCIAL DATA Living Status:  WIFE Admitted from facility:   Level of care:   Primary support name:  Twain Pollan [phone] Primary support relationship to patient:  SPOUSE Degree of support available:   Pt has supportive wife    CURRENT CONCERNS Current Concerns  Post-Acute Placement   Other Concerns:    SOCIAL WORK ASSESSMENT / PLAN CSW informed by RN CM that pt is ready for dc however pt wil not be able to dc home as planned because pt will not have 24 hr supervision. CSW spoke with pt and pt wife about recommendation for rehab. Pt is agreeable to being referred out to facilities in [facility]. At this time, pt and pt wife are unsure of preferences. CSW notified MD of change in dc plan.   Assessment/plan status:  Psychosocial Support/Ongoing Assessment of Needs Other assessment/ plan:   Information/referral to community resources:   SNF list    PATIENT'S/FAMILY'S RESPONSE TO PLAN OF CARE: Pt is agreeable to dc to SNF.       [place], [place]

## 2013-03-18 NOTE — Discharge Summary (Signed)
Physician Discharge Summary  [name]. [id number] DOB: 1945/05/29 DOA: 03/13/2013  PCP: [name], MD  Admit date: 03/13/2013 Discharge date: 03/18/2013  Time spent: > 35 minutes  Recommendations for Outpatient Follow-up:   1. please be sure to followup with the orthopedic surgeon once discharged from the hospital. 2. continue to assess serum creatinine levels, we'll discharge him from our 22 elevated serum creatinine level at 2.1 3. followup with infectious disease doctor in 2 week Continue to monitor blood sugars and adjust hypoglycemic agents pending values to  Discharge Diagnoses:  Principal Problem:   Cellulitis in diabetic foot Active Problems:   DM2 (diabetes mellitus, type 2)   Right foot ulcer   Hypertension   Chronic diastolic heart failure   Hyperglycemia   AKI (acute kidney injury)   Toe osteomyelitis, right   Non-healing ulcer of lower extremity   Acute osteomyelitis   Discharge Condition: Stable  Diet recommendation: Heart modified diet  Filed Weights   03/14/13 0206  Weight: 71.759 kg (158 lb 3.2 oz)    History of present illness:  Patient is a 67 year old male with history of diabetes mellitus, hypertension, seizures, chronic kidney disease stage II, status post recent BKA of the left leg. Who presented to the ED with purulent discharge on his right foot. Had a nonhealing ulcer at the fifth digit of right foot and diagnosed with osteomyelitis.[name]  Hospital Course:   Principal Problem:  Cellulitis in diabetic foot/acute osteomyelitis  - Orthopedic surgeon on board initially. Patient is status post left BKA revision and right foot fifth digit amputation. - Patient does not have 24-hour supervision at home and as such is requesting skilled nursing facility placement to discuss with social worker currently the plan is for transition to skilled nursing facility once bed available. - Patient is to take Bactrim and rifampin for 2 weeks  indicated by infectious disease Dr. and plan is for them to followup in 2 weeks time with Dr. Linus Salmons.  Active Problems:  DM2 (diabetes mellitus, type 2)  - Diabetic diet  - Continue Lantus on discharge   Right foot ulcer  - As mentioned above  - Wound care recommendations per orthopedic surgeon   Hypertension  -Currently on carvedilol, relatively well controlled on this regimen   Chronic diastolic heart failure  - Stable continue home regimen patient currently compensated  - Will hold ARB given elevated serum creatinine levels. May be continued at a later time with improvement in condition once infectious etiology clear.  AKI (acute kidney injury)  - Trending down currently with improved with oral intake and IV fluid rehydration (initially).   Procedures:   as listed above  Consultations:  Infectious disease  Orthopedic surgery  Discharge Exam: Filed Vitals:   03/18/13 0618  BP: 125/50  Pulse: 65  Temp: 98.6 F (37 C)  Resp: 18    General: Patient in no acute distress, alert and awake Cardiovascular: Regular rate and rhythm, no murmurs rubs or gallops Respiratory: Clear to auscultation bilaterally, no wheezes  Discharge Instructions  Discharge Orders   Future Appointments Provider Department Dept Phone   04/02/2013 3:00 PM Campbell Riches, MD [facility] [phone]   Future Orders Complete By Expires   Call MD for:  redness, tenderness, or signs of infection (pain, swelling, redness, odor or green/yellow discharge around incision site)  As directed    Call MD for:  temperature >100.4  As directed    Diet - low sodium  heart healthy  As directed    Discharge instructions  As directed    Comments:     Please be sure to follow up with your orthopaedic doctor post discharge.  Also follow up with your infectious disease doctor (Dr. Linus Salmons) in 2 weeks. His office phone number is [phone]   Increase activity slowly  As  directed        Medication List    STOP taking these medications       furosemide 20 MG tablet  Commonly known as:  LASIX     losartan 25 MG tablet  Commonly known as:  COZAAR      TAKE these medications       aspirin 325 MG tablet  Take 325 mg by mouth daily.     atorvastatin 20 MG tablet  Commonly known as:  LIPITOR  Take 20 mg by mouth daily.     carvedilol 6.25 MG tablet  Commonly known as:  COREG  Take 1 tablet (6.25 mg total) by mouth 2 (two) times daily with a meal.     insulin glargine 100 UNIT/ML injection  Commonly known as:  LANTUS  Inject 0.1 mLs (10 Units total) into the skin at bedtime.     levETIRAcetam 500 MG tablet  Commonly known as:  KEPPRA  Take 1 tablet (500 mg total) by mouth 2 (two) times daily.     polyethylene glycol packet  Commonly known as:  MIRALAX / GLYCOLAX  Take 17 g by mouth daily as needed (constipation).     rifampin 300 MG capsule  Commonly known as:  RIFADIN  Take 1 capsule (300 mg total) by mouth every 12 (twelve) hours.     senna-docusate 8.6-50 MG per tablet  Commonly known as:  Senokot-S  Take 1 tablet by mouth as needed.     silver sulfADIAZINE 1 % cream  Commonly known as:  SILVADENE  Apply topically daily.     sulfamethoxazole-trimethoprim 800-160 MG per tablet  Commonly known as:  BACTRIM DS  Take 1 tablet by mouth every 12 (twelve) hours.     tamsulosin 0.4 MG Caps capsule  Commonly known as:  FLOMAX  Take 1 capsule (0.4 mg total) by mouth daily after supper.     timolol 0.5 % ophthalmic solution  Commonly known as:  BETIMOL  Place 1 drop into both eyes every morning.     traMADol 50 MG tablet  Commonly known as:  ULTRAM  Take 1-2 tablets (50-100 mg total) by mouth every 6 (six) hours as needed for moderate pain.     Travoprost (BAK Free) 0.004 % Soln ophthalmic solution  Commonly known as:  [name]  Place 1 drop into both eyes at bedtime.       Allergies  Allergen Reactions  . Codeine  Anaphylaxis  . Penicillins Anaphylaxis       Follow-up Information   Follow up with [facility]. (home health physical and occupational therapy and nurse )    Contact information:   [address] [place] [address] [phone]       Follow up with NITKA,JAMES E, MD In 1 week.   Specialty:  Orthopedic Surgery   Contact information:   [place] [address] [phone]        The results of significant diagnostics from this hospitalization (including imaging, microbiology, ancillary and laboratory) are listed below for reference.    Significant Diagnostic Studies: Dg Tibia/fibula Left  03/14/2013  CLINICAL DATA:  Soft tissue ulcer at the and of the patient's stump. Patient is status post BKA  EXAM: LEFT TIBIA AND FIBULA - 2 VIEW  COMPARISON:  None.  FINDINGS: Patient is status post BKA. The osseous structures demonstrate no evidence of fracture, dislocation, nor cortical irregularity. There is no evidence of subcutaneous emphysema. Atherosclerotic calcifications are identified.  IMPRESSION: No evidence of acute osseous abnormalities. There no secondary signs raise concern of osteomyelitis.   Electronically Signed   By: Margaree Mackintosh M.D.   On: 03/14/2013 15:37   Dg Foot 2 Views Right  03/14/2013   CLINICAL DATA:  Heel soreness, possible osteomyelitis.  EXAM: RIGHT FOOT - 2 VIEW  COMPARISON:  None.  FINDINGS: Vascular calcifications are noted suggesting diabetes. Degenerative changes are seen involving the intertarsal joints. Status post amputation of the 2nd, 3rd, 4th and 5th toes, as well as the distal portions of the 3rd and 4th metatarsals. There is noted lucency involving the distal 5th metatarsal and 5th proximal phalanx suggesting osteomyelitis.  IMPRESSION: Status post surgical amputation of the 2nd, 3rd, 4th and 5th toes. Probable lucency in lytic destruction is seen involving the distal 5th metatarsal and 5th proximal phalanx  suggesting acute osteomyelitis.   Electronically Signed   By: Sabino Dick M.D.   On: 03/14/2013 09:21    Microbiology: Recent Results (from the past 240 hour(s))  WOUND CULTURE     Status: None   Collection Time    03/13/13 10:49 PM      Result Value Range Status   Specimen Description WOUND FOOT RIGHT   Final   Special Requests NONE   Final   Gram Stain     Final   Value: MODERATE WBC PRESENT, PREDOMINANTLY PMN     MODERATE SQUAMOUS EPITHELIAL CELLS PRESENT     MODERATE GRAM NEGATIVE RODS     MODERATE GRAM POSITIVE RODS     MODERATE GRAM POSITIVE COCCI   Culture     Final   Value: ABUNDANT ESCHERICHIA COLI     Performed at [facility]   Report Status 03/16/2013 FINAL   Final   Organism ID, Bacteria ESCHERICHIA COLI   Final  WOUND CULTURE     Status: None   Collection Time    03/13/13 10:55 PM      Result Value Range Status   Specimen Description WOUND LEG LEFT STUMP   Final   Special Requests NONE   Final   Gram Stain     Final   Value: ABUNDANT WBC PRESENT,BOTH PMN AND MONONUCLEAR     NO SQUAMOUS EPITHELIAL CELLS SEEN     MODERATE GRAM NEGATIVE RODS     MODERATE GRAM POSITIVE RODS     FEW GRAM POSITIVE COCCI   Culture     Final   Value: ABUNDANT STAPHYLOCOCCUS AUREUS     Note: RIFAMPIN AND GENTAMICIN SHOULD NOT BE USED AS SINGLE DRUGS FOR TREATMENT OF STAPH INFECTIONS. This organism DOES NOT demonstrate inducible Clindamycin resistance in vitro.     Performed at [facility]   Report Status 03/17/2013 FINAL   Final   Organism ID, Bacteria STAPHYLOCOCCUS AUREUS   Final  MRSA PCR SCREENING     Status: None   Collection Time    03/15/13  4:10 AM      Result Value Range Status   MRSA by PCR NEGATIVE  NEGATIVE Final   Comment:            The GeneXpert  MRSA Assay (FDA     approved for NASAL specimens     only), is one component of a     comprehensive MRSA colonization     surveillance program. It is not     intended to diagnose MRSA     infection nor  to guide or     monitor treatment for     MRSA infections.  GRAM STAIN     Status: None   Collection Time    03/15/13  9:16 AM      Result Value Range Status   Specimen Description WOUND   Final   Special Requests PATIENT ON FOLLOWING CIPRO AND VANCO LEFT STUMP   Final   Gram Stain     Final   Value: FEW WBC PRESENT,BOTH PMN AND MONONUCLEAR     NO ORGANISMS SEEN     CALLED TO C.SATTERFIELD,RN 1005 03/15/13 M.CAMPBELL   Report Status 03/15/2013 FINAL   Final  ANAEROBIC CULTURE     Status: None   Collection Time    03/15/13  9:16 AM      Result Value Range Status   Specimen Description WOUND   Final   Special Requests PATIENT ON FOLLOWING CIPRO AND VANCO LEFT STUMP   Final   Gram Stain     Final   Value: RARE WBC NO SQUAMOUS EPITHELIAL CELLS SEEN     RARE GRAM POSITIVE COCCI     IN PAIRS     Performed at [facility]   Culture     Final   Value: NO ANAEROBES ISOLATED; CULTURE IN PROGRESS FOR 5 DAYS     Performed at [facility]   Report Status PENDING   Incomplete  WOUND CULTURE     Status: None   Collection Time    03/15/13  9:16 AM      Result Value Range Status   Specimen Description WOUND   Final   Special Requests PATIENT ON FOLLOWING CIPRO AND VANCO LEFT STUMP   Final   Gram Stain     Final   Value: FEW WBC PRESENT,BOTH PMN AND MONONUCLEAR     NO SQUAMOUS EPITHELIAL CELLS SEEN     NO ORGANISMS SEEN     Gram Stain Report Called to,Read Back By and Verified With: Gram Stain Report Called to,Read Back By and Verified With: C SATTERFIELD RN [phone] M CAMPBELL Performed at [facility]     Performed at [facility]   Culture     Final   Value: FEW DIPHTHEROIDS(CORYNEBACTERIUM SPECIES)     Note: Standardized susceptibility testing for this organism is not available.     Performed at [facility]   Report Status 03/17/2013 FINAL   Final  GRAM STAIN     Status: None   Collection Time    03/15/13  9:57 AM      Result Value Range  Status   Specimen Description WOUND   Final   Special Requests     Final   Value: PATIENT ON FOLLOWING CIPRO AND VANCO RIGHT FIFTH TOE   Gram Stain     Final   Value: RARE WBC PRESENT, PREDOMINANTLY MONONUCLEAR     RARE GRAM POSITIVE COCCI IN PAIRS     CALLED TO COLLINS,S RN 03/15/13 1110 [place]   Report Status 03/15/2013 FINAL   Final  ANAEROBIC CULTURE     Status: None   Collection Time    03/15/13  9:57 AM  Result Value Range Status   Specimen Description WOUND   Final   Special Requests     Final   Value: PATIENT ON FOLLOWING CIPRO AND VANCO RIGHT FIFTH TOE   Gram Stain     Final   Value: RARE WBC PRESENT, PREDOMINANTLY MONONUCLEAR     RARE GRAM POSITIVE COCCI IN PAIRS     CALLED TO Theda Sers S RN 03/15/13 1110 [place] Performed at [facility]     Performed at [facility]   Culture     Final   Value: NO ANAEROBES ISOLATED; CULTURE IN PROGRESS FOR 5 DAYS     Performed at [facility]   Report Status PENDING   Incomplete  WOUND CULTURE     Status: None   Collection Time    03/15/13  9:57 AM      Result Value Range Status   Specimen Description WOUND   Final   Special Requests     Final   Value: PATIENT ON FOLLOWING CIPRO AND VANCO RIGHT FIFTH TOE   Gram Stain     Final   Value: RARE WBC PRESENT, PREDOMINANTLY MONONUCLEAR     RARE GRAM POSITIVE COCCI IN PAIRS     CALLED TO Shelia Media RN 03/15/13  1110 [place] K Performed at [facility]     Performed at [facility]   Culture     Final   Value: MODERATE ESCHERICHIA COLI     ABUNDANT STAPHYLOCOCCUS AUREUS     Note: RIFAMPIN AND GENTAMICIN SHOULD NOT BE USED AS SINGLE DRUGS FOR TREATMENT OF STAPH INFECTIONS. This organism DOES NOT demonstrate inducible Clindamycin resistance in vitro.     Performed at [facility]   Report Status 03/18/2013 FINAL   Final   Organism ID, Bacteria ESCHERICHIA COLI   Final   Organism ID, Bacteria STAPHYLOCOCCUS AUREUS   Final      Labs: Basic Metabolic Panel:  Recent Labs Lab 03/13/13 2123 03/14/13 0535 03/16/13 0835 03/16/13 1750  NA 135 140 135  --   K 5.3* 4.9 5.2* 4.9  CL 103 110 109  --   CO2 21 19 19   --   GLUCOSE 271* 191* 141*  --   BUN 55* 54* 33*  --   CREATININE 2.76* 2.70* 2.16*  --   CALCIUM 8.9 8.7 8.0*  --    Liver Function Tests:  Recent Labs Lab 03/13/13 2123  AST 17  ALT 12  ALKPHOS 67  BILITOT 0.3  PROT 7.4  ALBUMIN 2.5*   No results found for this basename: LIPASE, AMYLASE,  in the last 168 hours No results found for this basename: AMMONIA,  in the last 168 hours CBC:  Recent Labs Lab 03/13/13 2123 03/14/13 0535  WBC 11.4* 8.7  NEUTROABS 8.5*  --   HGB 8.0* 7.5*  HCT 24.2* 22.5*  MCV 84.9 84.9  PLT 232 227   Cardiac Enzymes: No results found for this basename: CKTOTAL, CKMB, CKMBINDEX, TROPONINI,  in the last 168 hours BNP: BNP (last 3 results)  Recent Labs  11/17/12 0016 11/29/12 1740  PROBNP 4406.0* 7199.0*   CBG:  Recent Labs Lab 03/17/13 2018 03/18/13 0032 03/18/13 0414 03/18/13 0913 03/18/13 1116  GLUCAP 125* 79 105* 138* 143*       Signed:  Velvet Bathe  [facility] 03/18/2013, 2:20 PM

## 2013-03-18 NOTE — Progress Notes (Addendum)
Clinical Social Work Department CLINICAL SOCIAL WORK PLACEMENT NOTE 03/18/2013  Patient:  BLAKE, QUEENAN  Account Number:  [id number] Admit date:  03/13/2013  Clinical Social Worker:  Berton Mount, Latanya Presser  Date/time:  03/18/2013 02:30 PM  Clinical Social Work is seeking post-discharge placement for this patient at the following level of care:   SKILLED NURSING   (*CSW will update this form in Epic as items are completed)   03/18/2013  Patient/family provided with [place] Department of Clinical Social Work's list of facilities offering this level of care within the geographic area requested by the patient (or if unable, by the patient's family).  03/18/2013  Patient/family informed of their freedom to choose among providers that offer the needed level of care, that participate in Medicare, Medicaid or managed care program needed by the patient, have an available bed and are willing to accept the patient.  03/18/2013  Patient/family informed of [facility]' ownership interest in [facility], as well as of the fact that they are under no obligation to receive care at this facility.  PASARR submitted to EDS on 03/18/2013 PASARR number received from EDS on 03/18/2013  FL2 transmitted to all facilities in geographic area requested by pt/family on  03/18/2013 FL2 transmitted to all facilities within larger geographic area on   Patient informed that his/her managed care company has contracts with or will negotiate with  certain facilities, including the following:     Patient/family informed of bed offers received:  03/18/2013 Patient chooses bed at [facility] Physician recommends and patient chooses bed at    Patient to be transferred to  [facility] on 03/18/2013   Patient to be transferred to facility by [facility]  The following physician request were entered in Epic:   Additional Comments:  [place], [place]

## 2013-03-18 NOTE — Progress Notes (Signed)
Physical Therapy Treatment Patient Details Name: [name]. MRN: [id number] DOB: [birth year] Today's Date: 03/18/2013 Time: [id number] PT Time Calculation (min): 28 min  PT Assessment / Plan / Recommendation  History of Present Illness [name]. is an 67 y.o. male with hx of DM2, HTN, Sz, CKD II, s/p BKA left leg, and several toe amputation on right leg, presents to the ER having increase purulent discharge on his right 5th toe.   PT Comments   Pt progressing towards physical therapy goals. He was able to ambulate 30 feet with a RW, and showed good strength/control in shoulders and lats. Wife reports she feels uncomfortable with d/c home due to decreased caregiver support when she is at work. Both wife and pt feel SNF will be a safer d/c option at this time.   Follow Up Recommendations  SNF     Does the patient have the potential to tolerate intense rehabilitation     Barriers to Discharge        Equipment Recommendations  Rolling walker with 5" wheels;3in1 (PT);Other (comment) (Consider drop-arm BSC)    Recommendations for Other Services    Frequency Min 3X/week   Progress towards PT Goals Progress towards PT goals: Progressing toward goals  Plan Discharge plan needs to be updated    Precautions / Restrictions Precautions Precautions: Fall Required Braces or Orthoses: Other Brace/Splint Other Brace/Splint: WBAT RLE in Darco show Restrictions Weight Bearing Restrictions: Yes RLE Weight Bearing: Weight bearing as tolerated   Pertinent Vitals/Pain Pt reports minimal pain throughout session.     Mobility  Bed Mobility Bed Mobility: Supine to Sit;Sitting - Scoot to Edge of Bed Supine to Sit: 4: Min guard Sitting - Scoot to [employer] of Bed: 5: Supervision Details for Bed Mobility Assistance: Min guard for initiation of Le movement to EOB.  Transfers Transfers: Sit to Stand;Stand to Sit Sit to Stand: 4: Min assist;From bed;With upper extremity assist Stand to Sit:  4: Min assist;To bed;With upper extremity assist Details for Transfer Assistance: Pt able to bear weight with darco shoe this session. Assist for steadying as he is coming to full stand. VC's for hand placement on seated surface for safety.  Ambulation/Gait Ambulation/Gait Assistance: 4: Min assist Ambulation Distance (Feet): 30 Feet Assistive device: Rolling walker Ambulation/Gait Assistance Details: VC's for sequencing and safety awareness with the RW. Pt requires occasional min assist to steady.  Gait Pattern:  (2-point gait pattern. ) Gait velocity: Decreased General Gait Details: Good UE and lat strength for smooth advance of RLE.  Stairs: No Wheelchair Mobility Wheelchair Mobility: No    Exercises General Exercises - Lower Extremity Quad Sets: 10 reps Short Arc Quad: 10 reps Heel Slides: 10 reps Hip ABduction/ADduction: 10 reps Straight Leg Raises: 10 reps   PT Diagnosis:    PT Problem List:   PT Treatment Interventions:     PT Goals (current goals can now be found in the care plan section) Acute Rehab PT Goals Patient Stated Goal: back to prosthesis PT Goal Formulation: With patient Time For Goal Achievement: 03/30/13 Potential to Achieve Goals: Good  Visit Information  Last PT Received On: 03/18/13 Assistance Needed: +1 History of Present Illness: Devin Wilbur. is an 67 y.o. male with hx of DM2, HTN, Sz, CKD II, s/p BKA left leg, and several toe amputation on right leg, presents to the ER having increase purulent discharge on his right 5th toe.    Subjective Data  Subjective: "I'll be good when  I can get my prosthetic on." Patient Stated Goal: back to prosthesis   Cognition  Cognition Arousal/Alertness: Awake/alert Behavior During Therapy: WFL for tasks assessed/performed Overall Cognitive Status: Within Functional Limits for tasks assessed    Balance     End of Session PT - End of Session Equipment Utilized During Treatment: Gait belt Activity  Tolerance: Patient tolerated treatment well Patient left: in bed;with call bell/phone within reach;with family/visitor present Nurse Communication: Mobility status   GP     [name] 03/18/2013, 3:07 PM  [name], [place], DPT [phone]

## 2013-03-18 NOTE — Care Management Note (Signed)
CARE MANAGEMENT NOTE 03/18/2013    03/18/13 2:00pm CM spoke with Mrs. Uren. She states she can not care for her husband 24/7 because she has to work. States he no longer is able to navigate the home as before. CM contacted Social Worker to assist. Ricki Miller, RN BSN Case Manager

## 2013-03-18 NOTE — Progress Notes (Addendum)
Patient ID: Devin Rakers., male   DOB: 19-Feb-1946, 67 y.o.   MRN: [id number] Subjective: 3 Days Post-Op Procedure(s) (LRB): AMPUTATION BELOW KNEE (Left) AMPUTATION DIGIT (Right) Awake, alert and oriented x 4. Working with PT on transfers. ID consulted for Antibiotic selection and treatment. No complaints.2 Patient reports pain as mild.    Objective:   VITALS:  Temp:  [98.5 F (36.9 C)-98.6 F (37 C)] 98.6 F (37 C) (12/30 0618) Pulse Rate:  [62-65] 65 (12/30 0618) Resp:  [18-20] 18 (12/30 0618) BP: (112-128)/(46-50) 125/50 mmHg (12/30 0618) SpO2:  [99 %-100 %] 99 % (12/30 0618)  ABD soft Incision: dressing C/D/I and scant drainage Left BKA dressing changed, incision painted with betadien and dry dressing with zeroform applied. Right foot dressing  NS wet to dry by nursing.  LABS No results found for this basename: HGB, WBC, PLT,  in the last 72 hours  Recent Labs  03/16/13 0835 03/16/13 1750  NA 135  --   K 5.2* 4.9  CL 109  --   CO2 19  --   BUN 33*  --   CREATININE 2.16*  --   GLUCOSE 141*  --    No results found for this basename: LABPT, INR,  in the last 72 hours   Assessment/Plan: 3 Days Post-Op Procedure(s) (LRB): AMPUTATION BELOW KNEE (Left) AMPUTATION DIGIT (Right)  Advance diet Up with therapy D/C IV fluids Discharge home with home health Orthopaedically Stable, dressing changes daily by HHN.  NITKA,JAMES E 03/18/2013, 10:53 AM

## 2013-03-18 NOTE — Progress Notes (Signed)
Right foot dressing change done.  Patient tolerated dressing change over-all very well. He refused any pain medication at this time.

## 2013-03-20 LAB — ANAEROBIC CULTURE

## 2013-03-21 ENCOUNTER — Non-Acute Institutional Stay (SKILLED_NURSING_FACILITY): Payer: Medicare Other | Admitting: Internal Medicine

## 2013-03-21 ENCOUNTER — Encounter: Payer: Self-pay | Admitting: Internal Medicine

## 2013-03-21 DIAGNOSIS — E1169 Type 2 diabetes mellitus with other specified complication: Secondary | ICD-10-CM

## 2013-03-21 DIAGNOSIS — N183 Chronic kidney disease, stage 3 unspecified: Secondary | ICD-10-CM

## 2013-03-21 DIAGNOSIS — L03119 Cellulitis of unspecified part of limb: Principal | ICD-10-CM

## 2013-03-21 DIAGNOSIS — L02619 Cutaneous abscess of unspecified foot: Secondary | ICD-10-CM

## 2013-03-21 DIAGNOSIS — I10 Essential (primary) hypertension: Secondary | ICD-10-CM

## 2013-03-21 DIAGNOSIS — E11628 Type 2 diabetes mellitus with other skin complications: Secondary | ICD-10-CM

## 2013-03-21 DIAGNOSIS — E119 Type 2 diabetes mellitus without complications: Secondary | ICD-10-CM

## 2013-03-21 DIAGNOSIS — I5032 Chronic diastolic (congestive) heart failure: Secondary | ICD-10-CM
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
## 2013-03-21 NOTE — Assessment & Plan Note (Signed)
OK on coreg-ARB is being held until Cr improves

## 2013-03-21 NOTE — Assessment & Plan Note (Addendum)
Currently compensated;on coreg, statin ,ASA, holdign ARB for renal fnx; D/C WEIGHT 158 POUNDS

## 2013-03-21 NOTE — Assessment & Plan Note (Addendum)
With AKI - 33/2.16 at d/c; will restart ARB when back at baseline

## 2013-03-21 NOTE — Assessment & Plan Note (Signed)
Diet and Lantus; HbA1c 4 months ago was 5.7

## 2013-03-21 NOTE — Assessment & Plan Note (Addendum)
Recent L BKA revision, now osteo after R foot 5th digit amputation; Rifampin and Septa for 2 weeks presumably after d/c on 12/30 so abx until 04/01/2013; wound care

## 2013-04-01 ENCOUNTER — Encounter: Payer: Self-pay | Admitting: Nurse Practitioner

## 2013-04-01 ENCOUNTER — Non-Acute Institutional Stay (SKILLED_NURSING_FACILITY): Payer: Medicare Other | Admitting: Nurse Practitioner

## 2013-04-01 DIAGNOSIS — F4321 Adjustment disorder with depressed mood: Secondary | ICD-10-CM
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
## 2013-04-02 ENCOUNTER — Ambulatory Visit (INDEPENDENT_AMBULATORY_CARE_PROVIDER_SITE_OTHER): Payer: Medicare Other | Admitting: Infectious Diseases

## 2013-04-02 ENCOUNTER — Encounter: Payer: Self-pay | Admitting: Infectious Diseases

## 2013-04-02 VITALS — BP 165/78 | HR 69 | Temp 97.6°F

## 2013-04-02 DIAGNOSIS — M8610 Other acute osteomyelitis, unspecified site: Secondary | ICD-10-CM

## 2013-04-02 DIAGNOSIS — N179 Acute kidney failure, unspecified: Secondary | ICD-10-CM

## 2013-04-02 NOTE — Assessment & Plan Note (Signed)
enouraged pt to keep foot offloaded. Will change his anbx to bactrim alone. Need to check his Cr.  I am very concerned about the long term viability of his foot. He has multiple, chronic appearing ulcers. He will f/u with surgery. Will see [place] back in 2 weeks.

## 2013-04-02 NOTE — Progress Notes (Signed)
   Subjective:    Patient ID: Devin Rakers., male    DOB: 12-23-1945, 68 y.o.   MRN: [id number]  HPI  68 y.o. M with hx of DM2 (dx >20 yrs ago), HTN, Sz, CKD II, s/p BKA left leg, and several toe amputations from right leg, presents to the ER 12-26 having increase purulent discharge on his right 5th toe. In the ER, he was found to have BS of 271, with Cr of 2.76 and BUN of 55. He was started on vanco/cipro --> vanco/zosyn. On 12-27 he underwent removal of R 5th digit and revision of his L BKA stump (which had developed an ulcer). His Cx grew MSSA and E coli (R-unasyn, cipro; I- cefoxitin), he was changed to rifampin and bactrim for 2 weeks.  He was d/c to SNF on 12-30, Cr had improved to 2.16.  Today states that his wounds have healed well. His FSG have been elevated last couple of weeks. No problems with anbx- no rashes, no loose BM.   Review of Systems  Constitutional: Negative for fever and chills.  Respiratory: Negative for shortness of breath.   Gastrointestinal: Negative for diarrhea and constipation.  Genitourinary: Negative for difficulty urinating.  Neurological: Positive for numbness.       Numbness in R thumb       Objective:   Physical Exam  Constitutional: He appears well-developed and well-nourished.  HENT:  Mouth/Throat: No oropharyngeal exudate.  Eyes: EOM are normal. Pupils are equal, round, and reactive to light.  Neck: Neck supple.  Cardiovascular: Normal rate, regular rhythm and normal heart sounds.   Pulmonary/Chest: Effort normal and breath sounds normal.  Abdominal: Soft. Bowel sounds are normal. He exhibits no distension. There is no tenderness.  Musculoskeletal:       Legs:      Feet:  Lymphadenopathy:    He has no cervical adenopathy.          Assessment & Plan:

## 2013-04-02 NOTE — Assessment & Plan Note (Signed)
Will have him seen by renal.

## 2013-04-09 ENCOUNTER — Other Ambulatory Visit: Payer: Self-pay | Admitting: *Deleted

## 2013-04-09 MED ORDER — TRAMADOL HCL 50 MG PO TABS: ORAL_TABLET | ORAL | Status: DC

## 2013-04-15 ENCOUNTER — Non-Acute Institutional Stay (SKILLED_NURSING_FACILITY): Payer: Medicare Other | Admitting: Nurse Practitioner

## 2013-04-15 DIAGNOSIS — L97519 Non-pressure chronic ulcer of other part of right foot with unspecified severity: Secondary | ICD-10-CM

## 2013-04-15 DIAGNOSIS — N183 Chronic kidney disease, stage 3 unspecified: Secondary | ICD-10-CM

## 2013-04-15 DIAGNOSIS — M869 Osteomyelitis, unspecified: Secondary | ICD-10-CM

## 2013-04-15 DIAGNOSIS — L97909 Non-pressure chronic ulcer of unspecified part of unspecified lower leg with unspecified severity: Secondary | ICD-10-CM

## 2013-04-15 DIAGNOSIS — I5032 Chronic diastolic (congestive) heart failure: Secondary | ICD-10-CM

## 2013-04-15 DIAGNOSIS — N400 Enlarged prostate without lower urinary tract symptoms: Secondary | ICD-10-CM

## 2013-04-15 DIAGNOSIS — I10 Essential (primary) hypertension: Secondary | ICD-10-CM

## 2013-04-15 DIAGNOSIS — E119 Type 2 diabetes mellitus without complications: Secondary | ICD-10-CM

## 2013-04-15 DIAGNOSIS — L97509 Non-pressure chronic ulcer of other part of unspecified foot with unspecified severity: Secondary | ICD-10-CM
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
## 2013-04-21 ENCOUNTER — Ambulatory Visit (INDEPENDENT_AMBULATORY_CARE_PROVIDER_SITE_OTHER): Payer: Medicare Other | Admitting: Infectious Diseases

## 2013-04-21 ENCOUNTER — Encounter: Payer: Self-pay | Admitting: Infectious Diseases

## 2013-04-21 VITALS — BP 136/68 | HR 62 | Temp 98.0°F

## 2013-04-21 DIAGNOSIS — L97909 Non-pressure chronic ulcer of unspecified part of unspecified lower leg with unspecified severity: Secondary | ICD-10-CM

## 2013-04-21 DIAGNOSIS — N183 Chronic kidney disease, stage 3 unspecified: Secondary | ICD-10-CM

## 2013-04-21 DIAGNOSIS — M869 Osteomyelitis, unspecified: Secondary | ICD-10-CM

## 2013-04-21 DIAGNOSIS — E119 Type 2 diabetes mellitus without complications: Secondary | ICD-10-CM

## 2013-04-21 LAB — BASIC METABOLIC PANEL
BUN: 32 mg/dL — AB (ref 6–23)
CO2: 24 meq/L (ref 19–32)
Calcium: 8.3 mg/dL — ABNORMAL LOW (ref 8.4–10.5)
Chloride: 110 mEq/L (ref 96–112)
Creat: 2.21 mg/dL — ABNORMAL HIGH (ref 0.50–1.35)
Glucose, Bld: 129 mg/dL — ABNORMAL HIGH (ref 70–99)
POTASSIUM: 5.7 meq/L — AB (ref 3.5–5.3)
Sodium: 136 mEq/L (ref 135–145)

## 2013-04-21 NOTE — Assessment & Plan Note (Signed)
Will f/u his Cr and K+ today.

## 2013-04-21 NOTE — Assessment & Plan Note (Signed)
States that this has been well controlled.

## 2013-04-21 NOTE — Assessment & Plan Note (Signed)
Completely healed now.

## 2013-04-21 NOTE — Progress Notes (Signed)
   Subjective:    Patient ID: Devin Rakers., male    DOB: [birth year], 68 y.o.   MRN: [id number]  HPI 68 y.o. M with hx of DM2 (dx >20 yrs ago), HTN, Sz, CKD II, s/p BKA left leg, and several toe amputations from right leg, presents to the ER 12-26 having increase purulent discharge on his right 5th toe. In the ER, he was found to have BS of 271, with Cr of 2.76 and BUN of 55. He was started on vanco/cipro --> vanco/zosyn. On 12-27 he underwent removal of R 5th digit and revision of his L BKA stump (which had developed an ulcer). His Cx grew MSSA and E coli (R-unasyn, cipro; I- cefoxitin), he was changed to rifampin and bactrim for 2 weeks.  He was d/c to SNF on 12-30, Cr had improved to 2.16.  He was seen in ID f/u on 04-02-13 with continued concern about his multiple foot wounds. He was changed to po bactrim alone.  Has had some d/c from his heal wound. Has had no trouble with po bactrim.  States that his sugars have been ok.    Review of Systems     Objective:   Physical Exam  Constitutional: He appears well-developed and well-nourished.  Musculoskeletal:       Legs:      Feet:          Assessment & Plan:

## 2013-04-21 NOTE — Assessment & Plan Note (Signed)
He appears to be doing better. Some concern about his chronic heel ulcer. Will send him to wound clinic (? [facility]). Will check his BMP today. Will continue his bactrim, see him back in 1 month.

## 2013-04-23 ENCOUNTER — Other Ambulatory Visit: Payer: Medicare Other

## 2013-04-23 ENCOUNTER — Telehealth: Payer: Self-pay | Admitting: *Deleted

## 2013-04-23 DIAGNOSIS — E875 Hyperkalemia: Secondary | ICD-10-CM

## 2013-04-23 NOTE — Telephone Encounter (Signed)
Confirmed with patient that he will come in today for repeat BMET for hyperkalemia and elevated creatinine.   Advised patient of wound care referral for his foot.  Patient given appointment of 2/16 at 9:30, to arrive 15 minutes early.  Patient given location of clinic and phone number to reschedule - [phone]. Landis Gandy, RN

## 2013-04-24 ENCOUNTER — Telehealth: Payer: Self-pay | Admitting: *Deleted

## 2013-04-24 LAB — BASIC METABOLIC PANEL
BUN: 30 mg/dL — AB (ref 6–23)
CALCIUM: 8.9 mg/dL (ref 8.4–10.5)
CO2: 22 meq/L (ref 19–32)
CREATININE: 2.29 mg/dL — AB (ref 0.50–1.35)
Chloride: 108 mEq/L (ref 96–112)
GLUCOSE: 108 mg/dL — AB (ref 70–99)
Potassium: 6 mEq/L — ABNORMAL HIGH (ref 3.5–5.3)
SODIUM: 135 meq/L (ref 135–145)

## 2013-04-24 NOTE — Telephone Encounter (Signed)
2/2 K+= 5.7, repeat 2/4 K+=6.0  BUN and creatinine also elevated.  Called patient, spoke with wife.  Patient asymptomatic at this time.  Pt's medications changed 2/2 by PCP - no longer taking Keppra, Losartan, or Lasix.  Per wife, patient has been eating high potassium foods (lots of orange juice and peanut butter for low blood sugar, using salt substitutes).  Patient has home health RN Alvis Lemmings) coming to home tomorrow for PT.  RN gave her the order to redraw BMP on 2/6, asked her to fax [place] and page Dr. Johnnye Sima with the result if after 5:00 pm Friday.  RN suggested patient's wife contact PCP for advice/office visit.  RN counseled patient's wife s/s of hyperkalemia, advised her to take him to the ED if these occur.  Will send information to patient's PCP as well.  Spoke with Dr. Wilson Singer, he would like to see the patient first thing tomorrow morning. Notified patient's wife - she verbalized understanding and agreement.  Landis Gandy, RN

## 2013-04-28 ENCOUNTER — Other Ambulatory Visit: Payer: Self-pay | Admitting: Licensed Clinical Social Worker

## 2013-04-28 DIAGNOSIS — M869 Osteomyelitis, unspecified: Secondary | ICD-10-CM

## 2013-04-28 MED ORDER — SULFAMETHOXAZOLE-TMP DS 800-160 MG PO TABS: 1.0000 | ORAL_TABLET | Freq: Two times a day (BID) | ORAL | Status: DC

## 2013-04-28 NOTE — Telephone Encounter (Signed)
Thank you :)

## 2013-04-29 ENCOUNTER — Other Ambulatory Visit: Payer: Self-pay | Admitting: *Deleted

## 2013-04-29 DIAGNOSIS — M869 Osteomyelitis, unspecified: Secondary | ICD-10-CM

## 2013-04-29 MED ORDER — SULFAMETHOXAZOLE-TMP DS 800-160 MG PO TABS: 1.0000 | ORAL_TABLET | Freq: Two times a day (BID) | ORAL | Status: DC

## 2013-04-29 NOTE — Telephone Encounter (Signed)
Patient's wife called for a refill of his bactrim DS BID.  Refill sent per last visit's notes.   Patient's potassium is still elevated at 5.7.  Pt is taking kayexolate, following up with his PCP.  Patient's wife is concerned if the bactrim BID may have any effect on the patient's kidneys, or if it is causing the high levels of potassium. Please advise if the dosing needs to be changed. Landis Gandy, RN

## 2013-04-29 NOTE — Telephone Encounter (Signed)
Can change his bactrim to doxy 100mg  bid until he has ID f/u.  thanks

## 2013-05-01 ENCOUNTER — Other Ambulatory Visit: Payer: Self-pay | Admitting: *Deleted

## 2013-05-01 DIAGNOSIS — M8610 Other acute osteomyelitis, unspecified site: Secondary | ICD-10-CM

## 2013-05-01 MED ORDER — DOXYCYCLINE HYCLATE 100 MG PO TABS: 100.0000 mg | ORAL_TABLET | Freq: Two times a day (BID) | ORAL | Status: DC

## 2013-05-01 NOTE — Telephone Encounter (Signed)
Sent in new prescription, notified patient of change in therapy.

## 2013-05-05 ENCOUNTER — Encounter (HOSPITAL_BASED_OUTPATIENT_CLINIC_OR_DEPARTMENT_OTHER): Payer: Medicare Other | Attending: Plastic Surgery

## 2013-05-05 DIAGNOSIS — L97409 Non-pressure chronic ulcer of unspecified heel and midfoot with unspecified severity: Secondary | ICD-10-CM | POA: Insufficient documentation

## 2013-05-05 DIAGNOSIS — S88119A Complete traumatic amputation at level between knee and ankle, unspecified lower leg, initial encounter: Secondary | ICD-10-CM | POA: Insufficient documentation

## 2013-05-05 DIAGNOSIS — Z794 Long term (current) use of insulin: Secondary | ICD-10-CM | POA: Insufficient documentation

## 2013-05-05 DIAGNOSIS — L97809 Non-pressure chronic ulcer of other part of unspecified lower leg with unspecified severity: Secondary | ICD-10-CM | POA: Insufficient documentation

## 2013-05-05 DIAGNOSIS — Z7982 Long term (current) use of aspirin: Secondary | ICD-10-CM | POA: Insufficient documentation

## 2013-05-05 DIAGNOSIS — S98139A Complete traumatic amputation of one unspecified lesser toe, initial encounter: Secondary | ICD-10-CM | POA: Insufficient documentation

## 2013-05-05 DIAGNOSIS — H409 Unspecified glaucoma: Secondary | ICD-10-CM | POA: Insufficient documentation

## 2013-05-05 DIAGNOSIS — I739 Peripheral vascular disease, unspecified: Secondary | ICD-10-CM | POA: Insufficient documentation

## 2013-05-05 DIAGNOSIS — E119 Type 2 diabetes mellitus without complications: Secondary | ICD-10-CM | POA: Insufficient documentation

## 2013-05-05 DIAGNOSIS — Z79899 Other long term (current) drug therapy: Secondary | ICD-10-CM | POA: Insufficient documentation

## 2013-05-06 ENCOUNTER — Telehealth: Payer: Self-pay

## 2013-05-06 NOTE — Telephone Encounter (Signed)
Patient's wife is very upset that oral anitibiotic was changed without her knowledge.  When she called she was not requesting a change but wanted to know if this was the source of the elevated potassium levels.   The doxycycline cost $175.00 and this will be a big expense for them.    Since they have purchased the medication for this month she wants to know if they would need to continue doxycycline or can they go back to the Bactrim which is less expensive.   Laverle Patter, RN

## 2013-05-06 NOTE — Telephone Encounter (Signed)
Appears from phone note 04-24-13 that they were notified of med change. His Bactrim was increasing his potassium so he was changed to doxy. This rx was on $4/$10 list for [facility] so I am not sure what the cost issue.

## 2013-05-06 NOTE — Progress Notes (Signed)
[facility]  NAME:  [name], [name] NO.:  [id number]  MEDICAL RECORD NO.:  [id number]      DATE OF BIRTH:  1945/12/28  PHYSICIAN:  Theodoro Kos, DO       VISIT DATE:  05/05/2013                                  OFFICE VISIT   HISTORY OF PRESENT ILLNESS:  The patient is a 68 year old male who is here for evaluation of multiple ulcers of right foot.  He has been putting Silvadene on them as of recently.  PAST MEDICAL HISTORY:  Positive for: 1. Diabetes. 2. Glaucoma. 3. Peripheral vascular disease. 4. Arthritis. 5. Anemia. 6. Seizures.  SURGICAL HISTORY:  Cataract removal, right foot surgery with amputations of toes, and left below-knee amputation.  MEDICATIONS:  Aspirin, Lipitor, carvedilol, Lantus, Bactrim, Flomax, Tretinoin, Silvadene.  ALLERGIES:  CODEINE and PENICILLIN.  SOCIAL HISTORY:  Half family lives at home.  He is in a wheelchair at present.  LABORATORY DATA:  Reviewed.  His protein is low.  PHYSICAL EXAMINATION:  GENERAL:  He is alert, oriented, and cooperative.  He seems to be aware of his condition and wants to save his foot.  He has been using the right foot to scoot, and he was instructed not to do that any more.  The sizes are noted in the chart and the heel was debrided.  He has a superficial wound on the dorsal aspect of the foot, 1 medially and 1 laterally.  It does not appear to be to bone but we will check a pre-albumin.  I recommend elevation, protein supplements that are diabetic sensitive, Santyl, collagen, multivitamin, vitamin C, zinc and follow up in a week.  Due to his known vascular disease, I do not think we need to pursue that at present, but if there is a progress then we will need to. His diabetes is being followed by his primary care physician.     Theodoro Kos, DO     CS/MEDQ  D:  05/05/2013  T:  05/06/2013  Job:  [id number]

## 2013-05-13 NOTE — Progress Notes (Signed)
[facility]  NAME:  [name], [name] NO.:  [id number]  MEDICAL RECORD NO.:  [id number]      DATE OF BIRTH:  01-01-1946  PHYSICIAN:  Theodoro Kos, DO       VISIT DATE:  05/12/2013                                  OFFICE VISIT   The patient is a 68 year old male who is here with family for followup on his right multiple lower extremity ulcers including heel, medial lateral foot and dorsum.  He has been using Santyl and collagen from his previous visit, and then Hydrogel with collagen.  There is some sign of improvement and some granulation tissue especially at the area of the heel, though this is encouraging.  There has been no change in his medications or social history.  Home Health is coming.  He was unsure about whether or not the prealbumin was checked, so we are going to double check on that and we have encouraged protein.  On exam, he is alert, oriented, cooperative, not in any acute distress. He is pleasant and aware of his situation.  He is pleased with the progress.  He does not have pain right now.  Debridement was done on the heel area.  His breathing is unlabored and his heart rate is regular. The wounds are noted in the chart and we will continue with Santyl collagen, elevation, multivitamin, vitamin C, zinc, and recheck of the prealbumin.  I will see him back in a week.     Theodoro Kos, DO     CS/MEDQ  D:  05/12/2013  T:  05/13/2013  Job:  [id number]

## 2013-05-19 ENCOUNTER — Ambulatory Visit: Payer: Medicare Other | Admitting: Infectious Diseases

## 2013-05-19 ENCOUNTER — Encounter (HOSPITAL_BASED_OUTPATIENT_CLINIC_OR_DEPARTMENT_OTHER): Payer: Medicare Other | Attending: Plastic Surgery

## 2013-05-19 DIAGNOSIS — L97509 Non-pressure chronic ulcer of other part of unspecified foot with unspecified severity: Secondary | ICD-10-CM | POA: Insufficient documentation

## 2013-05-19 DIAGNOSIS — E1169 Type 2 diabetes mellitus with other specified complication: Secondary | ICD-10-CM | POA: Insufficient documentation

## 2013-05-19 DIAGNOSIS — L97409 Non-pressure chronic ulcer of unspecified heel and midfoot with unspecified severity: Secondary | ICD-10-CM | POA: Insufficient documentation

## 2013-05-27 NOTE — Progress Notes (Signed)
[facility]  NAME:  [name], [name] NO.:  [id number]  MEDICAL RECORD NO.:  [id number]      DATE OF BIRTH:  09/28/1945  PHYSICIAN:  Theodoro Kos, DO       VISIT DATE:  05/26/2013                                  OFFICE VISIT   HISTORY:  The patient is a 68 year old male who is here for followup on his right foot multiple diabetic ulcers.  He is doing much better.  He has been drinking the Glucerna, taking his vitamins.  The only thing he has not been working on is the elevation because he is using this foot to scoot around the wheelchair, but everything does look better.  There is no change in his medications.  SOCIAL HISTORY:  Still has good family support.  His wife is at home. He is not smoking.  REVIEW OF SYSTEMS:  Negative.  PHYSICAL EXAMINATION:  GENERAL:  He is alert, oriented, cooperative, and pleasant.  He is pleased with his progress as well.  He is not having any difficulty with breathing. ABDOMEN:  Soft. EXTREMITIES:  His upper extremity pulses are strong and regular.  The wound has less fibrous tissue.  No sign of infection and measurements are noted in the chart.  ASSESSMENT:  We will continue with Santyl on the heel and the medial wound and then the dorsal one is healed, the lateral one is healed, and those will get triple antibiotic ointment just for the scabbed area. Lotion on his legs and wrap and elevation.  Continue with a protein and vitamins.     Theodoro Kos, DO     CS/MEDQ  D:  05/26/2013  T:  05/27/2013  Job:  [id number]

## 2013-06-03 LAB — GLUCOSE, CAPILLARY: Glucose-Capillary: 113 mg/dL — ABNORMAL HIGH (ref 70–99)

## 2013-06-03 NOTE — Progress Notes (Signed)
[facility]  NAME:  [name], [name]                 ACCOUNT NO.:  [id number]  MEDICAL RECORD NO.:  [id number]      DATE OF BIRTH:  Aug 21, 1945  PHYSICIAN:  Theodoro Kos, DO       VISIT DATE:  06/02/2013                                  OFFICE VISIT   HISTORY OF PRESENT ILLNESS:  The patient is a 68 year old gentleman, who is here for followup on his diabetic, multiple lower extremity foot ulcers.  He is doing much better.  Some of them have healed, the other ones, sizes are noted in the chart with description, but overall he is having improvement.  There has been no change in his medications or social history.  REVIEW OF SYSTEMS:  Negative.  PHYSICAL EXAMINATION:  On exam, he is alert, oriented, cooperative, not in any acute distress.  He has a lesion on the left eye __________.  It looks to be __________ and we discussed having that taken off later. The wounds are improving.  The swelling is improving.  There is no sign of infection.  My recommendation is to continue with silver collagen twice to be changed this week, and then follow up in 1 week and he is in agreement for that.  Continue with a protein, multivitamin, vitamin C, zinc elevation, and strict diabetic control.     Theodoro Kos, DO     CS/MEDQ  D:  06/02/2013  T:  06/02/2013  Job:  [id number]

## 2013-06-04 ENCOUNTER — Telehealth: Payer: Self-pay | Admitting: *Deleted

## 2013-06-04 NOTE — Telephone Encounter (Signed)
Patient's wife called wanting to know if he needs refill on doxycycline. Cost is an issue, it is $140 monthly. I called [facility] and this medication is no longer on the $4 list. His follow up appointment is 06/18/13; he had to be rescheduled from 05/19/13. Offered patient appointment 06/13/13 but could not come on that date. Please advise

## 2013-06-05 ENCOUNTER — Other Ambulatory Visit: Payer: Self-pay | Admitting: *Deleted

## 2013-06-05 DIAGNOSIS — M8610 Other acute osteomyelitis, unspecified site: Secondary | ICD-10-CM

## 2013-06-05 MED ORDER — DOXYCYCLINE HYCLATE 100 MG PO TABS: 100.0000 mg | ORAL_TABLET | Freq: Two times a day (BID) | ORAL | Status: DC

## 2013-06-05 NOTE — Telephone Encounter (Signed)
It is actually going to be $95 with his insurance, which is still better than [facility]. He is hoping to not have to be on this antibiotic much longer.

## 2013-06-05 NOTE — Telephone Encounter (Signed)
Thanks Dr. Johnnye Sima, patient notified and Rx called to [facility]. Myrtis [facility]

## 2013-06-05 NOTE — Telephone Encounter (Signed)
[facility]. If they have insurance it will be $10 or less

## 2013-06-05 NOTE — Telephone Encounter (Signed)
Thank you :)

## 2013-06-09 NOTE — Progress Notes (Signed)
[facility]  NAME:  KAIREN, CLASSON NO.:  [id number]  MEDICAL RECORD NO.:  [id number]      DATE OF BIRTH:  [birth year]  PHYSICIAN:  Theodoro Kos, DO       VISIT DATE:  06/09/2013                                  OFFICE VISIT   The patient is a 68 year old male, who is here for followup on his right lower extremity diabetic ulcers on the feet.  He is doing remarkably well.  He __________ others are improving both in depth size and overall appearance.  The heel has some excellent granulation tissue.  There has been no change in his medications or social history.  REVIEW OF SYSTEMS:  Negative.  He states that he has been taking protein and elevation in his vitamins.  PHYSICAL EXAMINATION:  He is alert, oriented, cooperative, not in any acute distress.  He is very pleasant.  His pupils are equal.  His extraocular muscles are intact.  His breathing is unlabored.  His heart rate is regular.  The wounds are described above.  No swelling or sign of infection noted. We will continue with the silver collagen and elevation, protein, multivitamins, and have him follow up in 1 week.     Theodoro Kos, DO     CS/MEDQ  D:  06/09/2013  T:  06/09/2013  Job:  [id number]

## 2013-06-16 NOTE — Progress Notes (Signed)
[facility]  NAME:  JANIE, MISSEL NO.:  [id number]  MEDICAL RECORD NO.:  [id number]      DATE OF BIRTH:  Aug 04, 1945  PHYSICIAN:  Theodoro Kos, DO       VISIT DATE:  06/16/2013                                  OFFICE VISIT   The patient is a 68 year old gentleman who is here for followup on his right lower extremity foot ulcers and heel ulcer.  He has been using silver collagen on the area and overall is showing very good improvement.  He has been staying home and keeping off his foot and keeping it elevated.  His family member is here with him.  There is no change in medications or social history.  REVIEW OF SYSTEMS:  Negative.  PHYSICAL EXAMINATION:  He is alert, oriented, cooperative, very pleasant.  Pupils are equal.  Breathing is unlabored.  Heart rate is regular.  Abdomen is soft.  The wounds were debrided and those notes are noted in the chart.  Overall, he is showing very good signs of improvement.  The recommendation is to continue with the silver collagen, offloading, multivitamin, vitamin C, protein, elevation, and we will see him back in 1 week.     Theodoro Kos, DO     CS/MEDQ  D:  06/16/2013  T:  06/16/2013  Job:  [id number]

## 2013-06-18 ENCOUNTER — Ambulatory Visit (INDEPENDENT_AMBULATORY_CARE_PROVIDER_SITE_OTHER): Payer: Medicare Other | Admitting: Infectious Diseases

## 2013-06-18 ENCOUNTER — Encounter: Payer: Self-pay | Admitting: Infectious Diseases

## 2013-06-18 VITALS — BP 143/77 | HR 76 | Temp 98.0°F | Ht 70.0 in | Wt 160.0 lb

## 2013-06-18 DIAGNOSIS — M8610 Other acute osteomyelitis, unspecified site: Secondary | ICD-10-CM

## 2013-06-18 NOTE — Progress Notes (Signed)
   Subjective:    Patient ID: Devin Rakers., male    DOB: 07/24/1945, 68 y.o.   MRN: [id number]  HPI 68 y.o. M with hx of DM2 (dx >20 yrs ago), HTN, Sz, CKD II, s/p BKA left leg, and several toe amputations from right leg, presents to the ER 12-26 having increase purulent discharge on his right 5th toe. In the ER, he was found to have BS of 271, with Cr of 2.76 and BUN of 55. He was started on vanco/cipro --> vanco/zosyn. On 12-27 he underwent removal of R 5th digit and revision of his L BKA stump (which had developed an ulcer). His Cx grew MSSA and E coli (R-unasyn, cipro; I- cefoxitin), he was changed to rifampin and bactrim for 2 weeks.  He was d/c to SNF on 12-30, Cr had improved to 2.16.  He was seen in ID f/u on 04-02-13 with continued concern about his multiple foot wounds. He was changed to po bactrim alone.  Was seen in ID f/u on 2-2, had been doing well. Continued on his bactrim alone.  Has had WOC f/u.  Has been doing well.   Review of Systems     Objective:   Physical Exam  Constitutional: He appears well-developed and well-nourished.  Musculoskeletal:       Feet:          Assessment & Plan:

## 2013-06-18 NOTE — Assessment & Plan Note (Addendum)
He is doing well. Will stop his anbx when he completes his current bottle. Will see him back prn (f/c, odor, pain, d/c). He has f/u at [facility] and they are doing an excellent job of caring for him. He asks about going back to church, agree with this provided he does not have prolonged standing.

## 2013-06-23 ENCOUNTER — Encounter (HOSPITAL_BASED_OUTPATIENT_CLINIC_OR_DEPARTMENT_OTHER): Payer: Medicare Other | Attending: Plastic Surgery

## 2013-06-23 DIAGNOSIS — L97809 Non-pressure chronic ulcer of other part of unspecified lower leg with unspecified severity: Secondary | ICD-10-CM | POA: Insufficient documentation

## 2013-06-24 NOTE — Progress Notes (Signed)
[facility]  NAME:  SUSIE, BUBEL NO.:  [id number]  MEDICAL RECORD NO.:  [id number]      DATE OF BIRTH:  [birth year]  PHYSICIAN:  Theodoro Kos, DO       VISIT DATE:  06/23/2013                                  OFFICE VISIT   HISTORY OF PRESENT ILLNESS:  The patient is a 68 year old male who is here for followup on his right foot ulcers.  He is doing extremely well and seems to be following the treatment plan with elevation and protein and multivitamin.  The wounds are improving left superficial with some granulation tissue and epithelialization.  REVIEW OF SYSTEMS:  Negative.  MEDICATIONS:  No change in medications.  PHYSICAL EXAMINATION:  GENERAL:  He is alert, oriented, and cooperative, not in any acute distress.  He is pleasant. HEENT:  Pupils are equal.  Extraocular muscles are intact.  No cervical lymphadenopathy. CHEST:  His breathing is unlabored. EXTREMITIES:  His upper extremity pulses are strong and regular.  The wounds are described below.  The recommendation is to continue with the collagen and we will see him back in 2 weeks.     Theodoro Kos, DO     CS/MEDQ  D:  06/23/2013  T:  06/24/2013  Job:  [id number]

## 2013-07-08 NOTE — Progress Notes (Signed)
[facility]  NAME:  [name], [name]                      ACCOUNT NO.:  MEDICAL RECORD NO.:  [id number]      DATE OF BIRTH:  1945/09/05  PHYSICIAN:  Theodoro Kos, DO       VISIT DATE:  07/07/2013                                  OFFICE VISIT   The patient is a 68 year old male who is here for followup on his right lower extremity ulcers.  Overall, he is doing extremely well, and seems to be very compliant.  He has no change in medications or social history.  REVIEW OF SYSTEMS:  Negative.  PHYSICAL EXAMINATION:  On exam, he is alert, oriented, cooperative, not in any acute distress, very pleasant.  His daughter is with him.  He is a good historian.  His pupils are equal.  His extraocular muscles are intact.  His breathing is unlabored.  His heart rate is regular.  His wounds are markedly improved.  He has got his prosthetic leg on the left.  On the right, he has got a small area medially and then on the heel.  The heel was debrided.  We will continue with the collagen and collagen foam Kerlix and see him back in 2 weeks.     Theodoro Kos, DO     CS/MEDQ  D:  07/07/2013  T:  07/08/2013  Job:  [id number]

## 2013-07-21 ENCOUNTER — Encounter (HOSPITAL_BASED_OUTPATIENT_CLINIC_OR_DEPARTMENT_OTHER): Payer: Medicare Other | Attending: Plastic Surgery

## 2013-07-21 DIAGNOSIS — L97409 Non-pressure chronic ulcer of unspecified heel and midfoot with unspecified severity: Secondary | ICD-10-CM | POA: Insufficient documentation

## 2013-07-21 DIAGNOSIS — L97509 Non-pressure chronic ulcer of other part of unspecified foot with unspecified severity: Secondary | ICD-10-CM | POA: Insufficient documentation

## 2013-07-22 NOTE — Progress Notes (Signed)
[facility]  NAME:  UZIEL, CAPLETTE NO.:  [id number]  MEDICAL RECORD NO.:  [id number]      DATE OF BIRTH:  03/26/1945  PHYSICIAN:  Theodoro Kos, DO       VISIT DATE:  07/21/2013                                  OFFICE VISIT   HISTORY:  The patient is a 68 year old male who is here for followup on his right foot ulcer, chronic.  He is doing much better.  He has not really elevating it according to his daughter, but the wound is healing. He is doing some protein and vitamins as well.  There is no change in medications or social history.  PHYSICAL EXAMINATION:  GENERAL:  On exam, he is alert, oriented, cooperative, very pleasant. HEENT:  Pupils are equal. RESPIRATORY:  Breathing is unlabored. CARDIOVASCULAR:  Heart rate is regular.  ASSESSMENT:  The wounds are decreasing in size.  No sign of infection. Debridement was done of the heel.  We will continue with the collagen, elevation, protein, multivitamin, vitamin C, and follow up in 2 weeks.     Theodoro Kos, DO     CS/MEDQ  D:  07/21/2013  T:  07/22/2013  Job:  [id number]

## 2013-08-06 NOTE — Progress Notes (Signed)
[facility]  NAME:  LOREN, APRAHAMIAN                      ACCOUNT NO.:  MEDICAL RECORD NO.:  [id number]      DATE OF BIRTH:  08/02/1945  PHYSICIAN:  Theodoro Kos, DO       VISIT DATE:  08/04/2013                                  OFFICE VISIT   HISTORY OF PRESENT ILLNESS:  The patient is a 68 year old male who is very pleasant.  He is here for followup on his right foot ulcers. The heel ulcer and the ankle ulcers have completely healed.  He has one on the dorsal aspect that is superficial.  The collagen has been doing excellent.  I think it ripped a little bit when the dressing was pulled off today.  He has got excellent family support.  REVIEW OF SYSTEMS:  Negative.  No change in medications.  PHYSICAL EXAMINATION:  GENERAL:  On exam, he is alert, oriented, cooperative, very pleasant, very pleased with his progress.  CHEST:  His breathing is unlabored. HEART:  His heart rate is regular.  His pulses are weak, but present. No sign of infection.  Continue with dressing changes, but we are going to do EndoForm and they have enough to do dressing changes at home, and we will see him back in 2 weeks.     Theodoro Kos, DO     CS/MEDQ  D:  08/04/2013  T:  08/05/2013  Job:  [id number]

## 2013-08-18 ENCOUNTER — Encounter (HOSPITAL_BASED_OUTPATIENT_CLINIC_OR_DEPARTMENT_OTHER): Payer: Medicare Other | Attending: Plastic Surgery

## 2013-08-18 DIAGNOSIS — M7989 Other specified soft tissue disorders: Secondary | ICD-10-CM | POA: Insufficient documentation

## 2013-08-18 DIAGNOSIS — I872 Venous insufficiency (chronic) (peripheral): Secondary | ICD-10-CM | POA: Insufficient documentation

## 2013-08-18 DIAGNOSIS — L97309 Non-pressure chronic ulcer of unspecified ankle with unspecified severity: Secondary | ICD-10-CM | POA: Insufficient documentation

## 2013-08-18 DIAGNOSIS — E119 Type 2 diabetes mellitus without complications: Secondary | ICD-10-CM | POA: Insufficient documentation

## 2013-08-19 NOTE — Progress Notes (Signed)
[facility]  NAME:  [name], [name]                 ACCOUNT NO.:  [id number]  MEDICAL RECORD NO.:  [id number]      DATE OF BIRTH:  1945-11-27  PHYSICIAN:  Irene Limbo, MD    VISIT DATE:  08/18/2013                                  OFFICE VISIT   CHIEF COMPLAINT:  Followup of recurrent ulcer of right ankle.  HISTORY OF PRESENT ILLNESS:  The patient is a 68 year old male with history of venous insufficiency and diabetes mellitus that presents for recurrent ulceration of his right anterior leg.  He is accompanied by his daughter who reports that he has recently resumed ambulation and is having slightly more swelling of the leg.  Previous wound care has been Endoform.  PHYSICAL EXAMINATION:  Blood pressure is 109/66, blood glucose is 115, pulse is 64, temperature is 97.9.  Right anterior ankle has open wound measured at 0.4 x 0.1 x 0.1 cm.  After application of topical anesthetic, curette was used to remove all the superficial slough present in the wound over its entirety.  Endoform was placed today with border dressing, and the patient will follow up in 2 weeks' time.          ______________________________ Irene Limbo, MD     BT/MEDQ  D:  08/18/2013  T:  08/19/2013  Job:  [id number]

## 2013-09-01 NOTE — Progress Notes (Signed)
[facility]  NAME:  JAYCEE, LABARR NO.:  [id number]  MEDICAL RECORD NO.:  [id number]      DATE OF BIRTH:  [birth year]  PHYSICIAN:  Theodoro Kos, DO       VISIT DATE:  09/01/2013                                  OFFICE VISIT   SUBJECTIVE:  The patient is a 68 year old male, who is here for evaluation of his right chronic venous insufficiency, lower extremity ulcer.  He has completely healed.  He has been placing the Endoform on it and has started using the Silver socks.  No change in medications.  REVIEW OF SYSTEMS:  Negative.  PHYSICAL EXAMINATION:  GENERAL:  On exam, he is alert, oriented, very pleasant.  His breathing is unlabored.  EXTREMITIES:  His left prosthetic is in place.  The right heel dorsal and plantar ulcers are all healed.  He can put collagen on the remaining abrasion and continue with the silver sock elevation and will see him back as needed.     Theodoro Kos, DO     CS/MEDQ  D:  09/01/2013  T:  09/01/2013  Job:  [id number]

## 2013-09-26 ENCOUNTER — Encounter (HOSPITAL_BASED_OUTPATIENT_CLINIC_OR_DEPARTMENT_OTHER): Payer: Medicare Other | Attending: General Surgery

## 2013-09-26 DIAGNOSIS — E1169 Type 2 diabetes mellitus with other specified complication: Secondary | ICD-10-CM | POA: Diagnosis present

## 2013-09-26 DIAGNOSIS — L97509 Non-pressure chronic ulcer of other part of unspecified foot with unspecified severity: Secondary | ICD-10-CM | POA: Insufficient documentation

## 2013-09-26 DIAGNOSIS — L97809 Non-pressure chronic ulcer of other part of unspecified lower leg with unspecified severity: Secondary | ICD-10-CM | POA: Insufficient documentation

## 2013-10-03 DIAGNOSIS — E1169 Type 2 diabetes mellitus with other specified complication: Secondary | ICD-10-CM | POA: Diagnosis not present

## 2013-10-03 DIAGNOSIS — L97509 Non-pressure chronic ulcer of other part of unspecified foot with unspecified severity: Secondary | ICD-10-CM | POA: Diagnosis not present

## 2013-10-03 DIAGNOSIS — L97809 Non-pressure chronic ulcer of other part of unspecified lower leg with unspecified severity: Secondary | ICD-10-CM | POA: Diagnosis not present

## 2013-10-10 DIAGNOSIS — E1169 Type 2 diabetes mellitus with other specified complication: Secondary | ICD-10-CM | POA: Diagnosis not present

## 2013-10-10 DIAGNOSIS — L97809 Non-pressure chronic ulcer of other part of unspecified lower leg with unspecified severity: Secondary | ICD-10-CM | POA: Diagnosis not present

## 2013-10-10 DIAGNOSIS — L97509 Non-pressure chronic ulcer of other part of unspecified foot with unspecified severity: Secondary | ICD-10-CM | POA: Diagnosis not present

## 2014-01-09 ENCOUNTER — Other Ambulatory Visit: Payer: Self-pay | Admitting: Nephrology

## 2014-01-09 DIAGNOSIS — N183 Chronic kidney disease, stage 3 unspecified: Secondary | ICD-10-CM

## 2014-01-14 ENCOUNTER — Ambulatory Visit (INDEPENDENT_AMBULATORY_CARE_PROVIDER_SITE_OTHER): Payer: Medicare Other | Admitting: Neurology

## 2014-01-14 ENCOUNTER — Other Ambulatory Visit: Payer: Medicare Other

## 2014-01-14 ENCOUNTER — Encounter: Payer: Self-pay | Admitting: Neurology

## 2014-01-14 VITALS — BP 135/76 | HR 69 | Ht 70.0 in | Wt 155.8 lb

## 2014-01-14 DIAGNOSIS — R413 Other amnesia: Secondary | ICD-10-CM | POA: Insufficient documentation

## 2014-01-14 DIAGNOSIS — E538 Deficiency of other specified B group vitamins: Secondary | ICD-10-CM

## 2014-01-14 HISTORY — DX: Other amnesia: R41.3

## 2014-01-14 MED ORDER — MEMANTINE HCL ER 28 MG PO CP24: 28.0000 mg | ORAL_CAPSULE | Freq: Every day | ORAL | Status: DC

## 2014-01-14 MED ORDER — MEMANTINE HCL ER 7 MG PO CP24: ORAL_CAPSULE | ORAL | Status: DC
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
## 2014-01-15 ENCOUNTER — Ambulatory Visit
Admission: RE | Admit: 2014-01-15 | Discharge: 2014-01-15 | Disposition: A | Discharge status: Home / Self Care | Payer: Medicare Other | Source: Ambulatory Visit | Attending: Nephrology | Admitting: Nephrology

## 2014-01-15 DIAGNOSIS — N183 Chronic kidney disease, stage 3 unspecified: Secondary | ICD-10-CM

## 2014-01-15 LAB — VITAMIN B12: Vitamin B-12: 1777 pg/mL — ABNORMAL HIGH (ref 211–946)

## 2014-01-15 LAB — RPR: SYPHILIS RPR SCR: NONREACTIVE

## 2014-01-29 ENCOUNTER — Ambulatory Visit
Admission: RE | Admit: 2014-01-29 | Discharge: 2014-01-29 | Disposition: A | Discharge status: Home / Self Care | Payer: Medicare Other | Source: Ambulatory Visit | Attending: Neurology | Admitting: Neurology

## 2014-01-29 DIAGNOSIS — R413 Other amnesia: Secondary | ICD-10-CM

## 2014-02-04 ENCOUNTER — Telehealth: Payer: Self-pay | Admitting: Neurology

## 2014-02-04 NOTE — Telephone Encounter (Signed)
Patients spouse called and would like to discuss MRI results further.  Please return call on mobile # [phone].

## 2014-02-04 NOTE — Telephone Encounter (Signed)
I called patient. MRI of the brain shows a moderate level of small vessel disease, and left occipital stroke that also affects the mesial temporal area on that side. Clearly, the cerebrovascular disease is likely a factor in the memory issue. The patient will be going on Namenda, he cannot take Aricept or Exelon because of seizures. Blood work was unremarkable. I discussed this with the patient.    MRI brain 02/03/2014:  IMPRESSION:  Abnormal MRI brain (without) demonstrating: 1. Chronic left occipital lobe infarction with gliosis and encephalomalacia.  2. Chronic right frontal corona radiata lacunar infarction.  3. Moderate periventricular, subcortical, pontine chronic small vessel ischemic disease. 4. No acute changes. No change from MRI on 09/30/10.

## 2014-02-04 NOTE — Telephone Encounter (Signed)
I called the wife, discussed the MRI results. The patient will go on Namenda, he will follow-up in February 2016.

## 2014-02-26 ENCOUNTER — Encounter (HOSPITAL_COMMUNITY): Payer: Self-pay | Admitting: Surgery

## 2014-04-02 ENCOUNTER — Telehealth: Payer: Self-pay | Admitting: Neurology

## 2014-04-02 NOTE — Telephone Encounter (Signed)
I called the patient, talk with the wife. The patient is on brand-name Namenda. It is possible that switching to generic may he of some help, they will call the pharmacy to find out how much the generic medication costs relative to brand-name.

## 2014-04-02 NOTE — Telephone Encounter (Signed)
Patient's spouse stated Rx Namenda will cost 200 plus dollars out of pocket.  Patient has only 1 pill left and questioning if there's an alternative medication.  Please call and advise.

## 2014-04-22 DIAGNOSIS — N390 Urinary tract infection, site not specified: Secondary | ICD-10-CM | POA: Diagnosis not present

## 2014-04-22 DIAGNOSIS — N400 Enlarged prostate without lower urinary tract symptoms: Secondary | ICD-10-CM | POA: Diagnosis not present

## 2014-04-22 DIAGNOSIS — E118 Type 2 diabetes mellitus with unspecified complications: Secondary | ICD-10-CM | POA: Diagnosis not present

## 2014-04-24 DIAGNOSIS — R339 Retention of urine, unspecified: Secondary | ICD-10-CM | POA: Diagnosis not present

## 2014-04-24 DIAGNOSIS — R300 Dysuria: Secondary | ICD-10-CM | POA: Diagnosis not present

## 2014-04-24 DIAGNOSIS — N419 Inflammatory disease of prostate, unspecified: Secondary | ICD-10-CM | POA: Diagnosis not present

## 2014-04-30 DIAGNOSIS — E789 Disorder of lipoprotein metabolism, unspecified: Secondary | ICD-10-CM | POA: Diagnosis not present

## 2014-04-30 DIAGNOSIS — N400 Enlarged prostate without lower urinary tract symptoms: Secondary | ICD-10-CM | POA: Diagnosis not present

## 2014-04-30 DIAGNOSIS — E118 Type 2 diabetes mellitus with unspecified complications: Secondary | ICD-10-CM | POA: Diagnosis not present

## 2014-05-18 ENCOUNTER — Ambulatory Visit: Payer: Medicare Other | Admitting: Nurse Practitioner

## 2014-05-19 ENCOUNTER — Ambulatory Visit (INDEPENDENT_AMBULATORY_CARE_PROVIDER_SITE_OTHER): Payer: Medicare Other | Admitting: Nurse Practitioner

## 2014-05-19 ENCOUNTER — Encounter: Payer: Self-pay | Admitting: Nurse Practitioner

## 2014-05-19 VITALS — BP 158/75 | HR 71 | Ht 70.0 in | Wt 155.8 lb

## 2014-05-19 DIAGNOSIS — R413 Other amnesia: Secondary | ICD-10-CM | POA: Diagnosis not present

## 2014-05-19 MED ORDER — MEMANTINE HCL ER 28 MG PO CP24: 28.0000 mg | ORAL_CAPSULE | Freq: Every day | ORAL | Status: DC

## 2014-05-19 NOTE — Progress Notes (Signed)
I have read the note, and I agree with the clinical assessment and plan.  [name],[name]   

## 2014-05-19 NOTE — Patient Instructions (Signed)
Continue Namenda XR 28 mg daily will refill With history of seizures Aricept and Exelon cannot be used Given information on dementia Follow-up in 6 months
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
## 2014-05-25 ENCOUNTER — Telehealth: Payer: Self-pay
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
## 2014-05-26 DIAGNOSIS — H4011X3 Primary open-angle glaucoma, severe stage: Secondary | ICD-10-CM | POA: Diagnosis not present

## 2014-06-08 DIAGNOSIS — E875 Hyperkalemia: Secondary | ICD-10-CM | POA: Diagnosis not present

## 2014-06-08 DIAGNOSIS — D649 Anemia, unspecified: Secondary | ICD-10-CM | POA: Diagnosis not present

## 2014-06-08 DIAGNOSIS — Z7689 Persons encountering health services in other specified circumstances: Secondary | ICD-10-CM | POA: Diagnosis not present

## 2014-06-08 DIAGNOSIS — I129 Hypertensive chronic kidney disease with stage 1 through stage 4 chronic kidney disease, or unspecified chronic kidney disease: Secondary | ICD-10-CM | POA: Diagnosis not present

## 2014-06-08 DIAGNOSIS — N183 Chronic kidney disease, stage 3 (moderate): Secondary | ICD-10-CM | POA: Diagnosis not present

## 2014-06-11 ENCOUNTER — Telehealth: Payer: Self-pay | Admitting: *Deleted

## 2014-06-11 DIAGNOSIS — I5032 Chronic diastolic (congestive) heart failure: Secondary | ICD-10-CM

## 2014-06-11 NOTE — Telephone Encounter (Signed)
PT  AWARE OF THE NEED  FOR  ECHO  AND  SAME DAY APPT .[name]

## 2014-07-13 NOTE — Progress Notes (Signed)
Patient ID: [name], male   DOB: 1946/03/01, 69 y.o.   MRN: [id number] [name] is a 69 y.o. M with history of DM, CKD stage II, HTN, seizure disorder, and multiple prior episodes of osteomyelitis with recent complications who presented to [facility] 11/26/2012 with hypoglycemia with development of respiratory distress. He was found to have EF of 30-35% by echo thus we are consulted. In July 2014, he underwent surgery on his R foot with placement of antibiotic beads for osteomyelitis. He was admitted 8/5 with severe sepsis complicated by AKI, toxic metabolic encephalopathy with uremia, and possible surgical foot infection requiring I&D. He was discharged with plan for 6 weeks abx but returned 8/30 with confusion, fever, cough and was found to have HCAP, sepsis, and severe anemia with Hgb of 6 later felt due to AOCD + bone marrow suppression from sepsis. He was discharged on 11/25/12 with plan to complete PICC Vanc & oral Cipro but returned the following day on 11/26/12 with hypoglycemia and hypothermia. That day he had taken his insulin, ate his breakfast, got in bed to take a nap and complained of feeling hot. His family checked on him and found him drenched in sweat and disoriented. CBG was 48 and EMS was called - despite food and juice, CBG via EMS was 26 requiring glucagon and D50. CXR showed persistent opacities so antibiotic coverage was expanded. On 11/29/12 he developed acute decompensation with n/v, change in mental status, and hypoxemic respiratory failure requiring intubation ?aspiration event. He also had severe hypoalbuminemia 1.6, troponins neg x 3. D-dimer 1.95 with LE dopp neg for DVT. He was also treated with IV Lasix for 4 days which was stopped due to concern for getting dry with sinus tach. He self-extubated on 9/13 and is now satting normally on RA. As part of his eval, 2D echo was obtained showing mildly dilated LV, EF 30-35% with +WMA as below, grade 1 diastolic dysfunction, mild MR,  severely dilated LA, PA Pressure 95mmHg.   Since hospital d/c improving Still has a tenuous situation with his RLE in cast and non weight bearing.  Limited activity so no chest pain or dyspnea.  Seeing neuro for memory issues from previous left occipital infarct and lacunar infarcts.  Started on Namenda  02/11/13 non ischemic myovue but consistent with ischemic DCM Overall Impression: Intermediate risk stress nuclear study with primarily fixed, medium-sized basal to mid anterior and primarily fixed, small basal inferior perfusion defects. There does not appear to be significant ischemia (intermediate risk because of EF). Difficult study because of significant patient motion, therefore defects may be artifactual.   LV Ejection Fraction: 36%. LV Wall Motion: Global hypokinesis.   Echo 01/2013  Confirms decreased EF Study Conclusions  - Left ventricle: The cavity size was mildly dilated. Wall thickness was normal. Systolic function was severely reduced. The estimated ejection fraction was in the range of 25% to 30%. Doppler parameters are consistent with abnormal left ventricular relaxation (grade 1 diastolic dysfunction). - Mitral valve: Mild regurgitation.  03/15/13 Had right BKA   Doing well no chest pain or CHF  Working out at [employer] and ambulates well with prosthetic leg  ROS: Denies fever, malais, weight loss, blurry vision, decreased visual acuity, cough, sputum, SOB, hemoptysis, pleuritic pain, palpitaitons, heartburn, abdominal pain, melena, lower extremity edema, claudication, or rash.  All other systems reviewed and negative  General: Affect appropriate Chronically ill black male HEENT: normal Neck supple with no adenopathy JVP normal no bruits no thyromegaly  Lungs clear with no wheezing and good diaphragmatic motion Heart:  S1/S2 no murmur, no rub, gallop or click PMI normal Abdomen: benighn, BS positve, no tenderness, no AAA no bruit.  No HSM or HJR Distal  pulses intact to popliteal S/P left BKA  Right leg in boot with healing infection in foot No edema Neuro non-focal Skin warm and dry No muscular weakness   Current Outpatient Prescriptions  Medication Sig Dispense Refill  . aspirin 325 MG tablet Take 325 mg by mouth daily.    [name] atorvastatin (LIPITOR) 20 MG tablet Take 20 mg by mouth daily.    . carvedilol (COREG) 6.25 MG tablet Take 1 tablet (6.25 mg total) by mouth 2 (two) times daily with a meal. 60 tablet 0  . diclofenac sodium (VOLTAREN) 1 % GEL Apply topically as needed.    . hydrochlorothiazide (HYDRODIURIL) 50 MG tablet Take 50 mg by mouth daily.    . insulin detemir (LEVEMIR) 100 UNIT/ML injection Inject 10 Units into the skin at bedtime.     . levETIRAcetam (KEPPRA) 500 MG tablet Take 1 tablet (500 mg total) by mouth 2 (two) times daily. 60 tablet 0  . memantine (NAMENDA XR) 28 MG CP24 24 hr capsule Take 1 capsule (28 mg total) by mouth daily. 30 capsule 6  . Multiple Vitamins-Minerals (CENTRUM SILVER ADULT 50+ PO) Take by mouth daily.    . tamsulosin (FLOMAX) 0.4 MG CAPS capsule Take 1 capsule (0.4 mg total) by mouth daily after supper. 30 capsule 5  . timolol (BETIMOL) 0.5 % ophthalmic solution Place 1 drop into both eyes every morning.     . Travoprost, BAK Free, ([name]) 0.004 % SOLN ophthalmic solution Place 1 drop into both eyes at bedtime.    [name] venlafaxine XR (EFFEXOR-XR) 75 MG 24 hr capsule Take 75 mg by mouth daily.    . vitamin C (ASCORBIC ACID) 500 MG tablet Take 500 mg by mouth daily.     No current facility-administered medications for this visit.    Allergies  Codeine and Penicillins  Electrocardiogram:  12/06/13 SR rate 89 inferior T wave changes 07/14/14  SR rate 65  LVH otherwise normal  Assessment and Plan CAD:   Stable with no angina and good activity level.  Continue medical Rx CHF:  Euvolemic  EF 35% by echo today reviewed and no change from 2013   PVD:  F/u vascular stump looks good with no ulcers  and no longer seeing ID or on antibiotics Chol:  On station  Cholesterol is at goal.  Continue current dose of statin and diet Rx.  No myalgias or side effects.  F/U  LFT's in 6 months. Lab Results  Component Value Date   LDLCALC 73 12/04/2012

## 2014-07-14 ENCOUNTER — Ambulatory Visit (HOSPITAL_COMMUNITY): Payer: Medicare Other | Attending: Cardiovascular Disease | Admitting: Radiology

## 2014-07-14 ENCOUNTER — Encounter: Payer: Self-pay | Admitting: Cardiovascular Disease

## 2014-07-14 ENCOUNTER — Ambulatory Visit (INDEPENDENT_AMBULATORY_CARE_PROVIDER_SITE_OTHER): Payer: Medicare Other | Admitting: Cardiovascular Disease

## 2014-07-14 VITALS — BP 118/78 | HR 65 | Ht 71.0 in | Wt 152.0 lb

## 2014-07-14 DIAGNOSIS — I2510 Atherosclerotic heart disease of native coronary artery without angina pectoris: Secondary | ICD-10-CM

## 2014-07-14 DIAGNOSIS — I5031 Acute diastolic (congestive) heart failure: Secondary | ICD-10-CM | POA: Diagnosis not present

## 2014-07-14 DIAGNOSIS — I5032 Chronic diastolic (congestive) heart failure: Secondary | ICD-10-CM | POA: Diagnosis not present

## 2014-07-14 DIAGNOSIS — I2583 Coronary atherosclerosis due to lipid rich plaque: Principal | ICD-10-CM

## 2014-07-14 NOTE — Progress Notes (Signed)
Echocardiogram performed.  

## 2014-07-14 NOTE — Patient Instructions (Signed)
Medication Instructions:  NO CHANGES  Labwork: NONE  Testing/Procedures: NONE  Follow-Up: Your physician wants you to follow-up in: 6 MONTHS WITH DR NISHAN You will receive a reminder letter in the mail two months in advance. If you don't receive a letter, please call our office to schedule the follow-up appointment.  Any Other Special Instructions Will Be Listed Below (If Applicable).   

## 2014-07-23 DIAGNOSIS — E118 Type 2 diabetes mellitus with unspecified complications: Secondary | ICD-10-CM | POA: Diagnosis not present

## 2014-07-23 DIAGNOSIS — I10 Essential (primary) hypertension: Secondary | ICD-10-CM | POA: Diagnosis not present

## 2014-07-30 DIAGNOSIS — E789 Disorder of lipoprotein metabolism, unspecified: Secondary | ICD-10-CM | POA: Diagnosis not present

## 2014-07-30 DIAGNOSIS — I10 Essential (primary) hypertension: Secondary | ICD-10-CM | POA: Diagnosis not present

## 2014-07-30 DIAGNOSIS — G40909 Epilepsy, unspecified, not intractable, without status epilepticus: Secondary | ICD-10-CM | POA: Diagnosis not present

## 2014-07-30 DIAGNOSIS — E118 Type 2 diabetes mellitus with unspecified complications: Secondary | ICD-10-CM | POA: Diagnosis not present

## 2014-09-01 DIAGNOSIS — M25551 Pain in right hip: Secondary | ICD-10-CM | POA: Diagnosis not present

## 2014-09-01 DIAGNOSIS — W19XXXA Unspecified fall, initial encounter: Secondary | ICD-10-CM | POA: Diagnosis not present

## 2014-09-01 DIAGNOSIS — S79911A Unspecified injury of right hip, initial encounter: Secondary | ICD-10-CM | POA: Diagnosis not present

## 2014-09-01 DIAGNOSIS — M549 Dorsalgia, unspecified: Secondary | ICD-10-CM | POA: Diagnosis not present

## 2014-09-01 DIAGNOSIS — E118 Type 2 diabetes mellitus with unspecified complications: Secondary | ICD-10-CM | POA: Diagnosis not present

## 2014-09-02 DIAGNOSIS — W19XXXD Unspecified fall, subsequent encounter: Secondary | ICD-10-CM | POA: Diagnosis not present

## 2014-09-02 DIAGNOSIS — Z79899 Other long term (current) drug therapy: Secondary | ICD-10-CM | POA: Diagnosis not present

## 2014-09-11 DIAGNOSIS — R296 Repeated falls: Secondary | ICD-10-CM | POA: Diagnosis not present

## 2014-09-11 DIAGNOSIS — R269 Unspecified abnormalities of gait and mobility: Secondary | ICD-10-CM | POA: Diagnosis not present

## 2014-09-11 DIAGNOSIS — M6281 Muscle weakness (generalized): Secondary | ICD-10-CM | POA: Diagnosis not present

## 2014-09-11 DIAGNOSIS — R262 Difficulty in walking, not elsewhere classified: Secondary | ICD-10-CM | POA: Diagnosis not present

## 2014-09-15 DIAGNOSIS — R262 Difficulty in walking, not elsewhere classified: Secondary | ICD-10-CM | POA: Diagnosis not present

## 2014-09-15 DIAGNOSIS — M6281 Muscle weakness (generalized): Secondary | ICD-10-CM | POA: Diagnosis not present

## 2014-09-15 DIAGNOSIS — R296 Repeated falls: Secondary | ICD-10-CM | POA: Diagnosis not present

## 2014-09-15 DIAGNOSIS — R269 Unspecified abnormalities of gait and mobility: Secondary | ICD-10-CM | POA: Diagnosis not present

## 2014-09-17 DIAGNOSIS — R269 Unspecified abnormalities of gait and mobility: Secondary | ICD-10-CM | POA: Diagnosis not present

## 2014-09-17 DIAGNOSIS — M6281 Muscle weakness (generalized): Secondary | ICD-10-CM | POA: Diagnosis not present

## 2014-09-17 DIAGNOSIS — R296 Repeated falls: Secondary | ICD-10-CM | POA: Diagnosis not present

## 2014-09-17 DIAGNOSIS — R262 Difficulty in walking, not elsewhere classified: Secondary | ICD-10-CM | POA: Diagnosis not present

## 2014-09-18 DIAGNOSIS — L97212 Non-pressure chronic ulcer of right calf with fat layer exposed: Secondary | ICD-10-CM | POA: Diagnosis not present

## 2014-09-18 DIAGNOSIS — Z89512 Acquired absence of left leg below knee: Secondary | ICD-10-CM | POA: Diagnosis not present

## 2014-09-22 DIAGNOSIS — R296 Repeated falls: Secondary | ICD-10-CM | POA: Diagnosis not present

## 2014-09-22 DIAGNOSIS — M6281 Muscle weakness (generalized): Secondary | ICD-10-CM | POA: Diagnosis not present

## 2014-09-22 DIAGNOSIS — R262 Difficulty in walking, not elsewhere classified: Secondary | ICD-10-CM | POA: Diagnosis not present

## 2014-09-22 DIAGNOSIS — R269 Unspecified abnormalities of gait and mobility: Secondary | ICD-10-CM | POA: Diagnosis not present

## 2014-09-23 DIAGNOSIS — E118 Type 2 diabetes mellitus with unspecified complications: Secondary | ICD-10-CM | POA: Diagnosis not present

## 2014-09-23 DIAGNOSIS — R270 Ataxia, unspecified: Secondary | ICD-10-CM | POA: Diagnosis not present

## 2014-09-24 DIAGNOSIS — M6281 Muscle weakness (generalized): Secondary | ICD-10-CM | POA: Diagnosis not present

## 2014-09-24 DIAGNOSIS — R262 Difficulty in walking, not elsewhere classified: Secondary | ICD-10-CM | POA: Diagnosis not present

## 2014-09-24 DIAGNOSIS — R269 Unspecified abnormalities of gait and mobility: Secondary | ICD-10-CM | POA: Diagnosis not present

## 2014-09-24 DIAGNOSIS — R296 Repeated falls: Secondary | ICD-10-CM | POA: Diagnosis not present

## 2014-09-28 DIAGNOSIS — R269 Unspecified abnormalities of gait and mobility: Secondary | ICD-10-CM | POA: Diagnosis not present

## 2014-09-28 DIAGNOSIS — M6281 Muscle weakness (generalized): Secondary | ICD-10-CM | POA: Diagnosis not present

## 2014-09-28 DIAGNOSIS — R262 Difficulty in walking, not elsewhere classified: Secondary | ICD-10-CM | POA: Diagnosis not present

## 2014-09-28 DIAGNOSIS — R296 Repeated falls: Secondary | ICD-10-CM | POA: Diagnosis not present

## 2014-09-30 DIAGNOSIS — M6281 Muscle weakness (generalized): Secondary | ICD-10-CM | POA: Diagnosis not present

## 2014-09-30 DIAGNOSIS — R262 Difficulty in walking, not elsewhere classified: Secondary | ICD-10-CM | POA: Diagnosis not present

## 2014-09-30 DIAGNOSIS — R269 Unspecified abnormalities of gait and mobility: Secondary | ICD-10-CM | POA: Diagnosis not present

## 2014-09-30 DIAGNOSIS — R296 Repeated falls: Secondary | ICD-10-CM | POA: Diagnosis not present

## 2014-10-06 DIAGNOSIS — R262 Difficulty in walking, not elsewhere classified: Secondary | ICD-10-CM | POA: Diagnosis not present

## 2014-10-06 DIAGNOSIS — M6281 Muscle weakness (generalized): Secondary | ICD-10-CM | POA: Diagnosis not present

## 2014-10-06 DIAGNOSIS — R269 Unspecified abnormalities of gait and mobility: Secondary | ICD-10-CM | POA: Diagnosis not present

## 2014-10-06 DIAGNOSIS — R296 Repeated falls: Secondary | ICD-10-CM | POA: Diagnosis not present

## 2014-10-07 DIAGNOSIS — M6281 Muscle weakness (generalized): Secondary | ICD-10-CM | POA: Diagnosis not present

## 2014-10-07 DIAGNOSIS — R296 Repeated falls: Secondary | ICD-10-CM | POA: Diagnosis not present

## 2014-10-07 DIAGNOSIS — R269 Unspecified abnormalities of gait and mobility: Secondary | ICD-10-CM | POA: Diagnosis not present

## 2014-10-07 DIAGNOSIS — R262 Difficulty in walking, not elsewhere classified: Secondary | ICD-10-CM | POA: Diagnosis not present

## 2014-10-16 DIAGNOSIS — M6281 Muscle weakness (generalized): Secondary | ICD-10-CM | POA: Diagnosis not present

## 2014-10-16 DIAGNOSIS — R296 Repeated falls: Secondary | ICD-10-CM | POA: Diagnosis not present

## 2014-10-16 DIAGNOSIS — R269 Unspecified abnormalities of gait and mobility: Secondary | ICD-10-CM | POA: Diagnosis not present

## 2014-10-16 DIAGNOSIS — R262 Difficulty in walking, not elsewhere classified: Secondary | ICD-10-CM | POA: Diagnosis not present

## 2014-10-19 DIAGNOSIS — Z0000 Encounter for general adult medical examination without abnormal findings: Secondary | ICD-10-CM | POA: Diagnosis not present

## 2014-10-19 DIAGNOSIS — N400 Enlarged prostate without lower urinary tract symptoms: Secondary | ICD-10-CM | POA: Diagnosis not present

## 2014-10-19 DIAGNOSIS — E875 Hyperkalemia: Secondary | ICD-10-CM | POA: Diagnosis not present

## 2014-10-19 DIAGNOSIS — I10 Essential (primary) hypertension: Secondary | ICD-10-CM | POA: Diagnosis not present

## 2014-10-19 DIAGNOSIS — E118 Type 2 diabetes mellitus with unspecified complications: Secondary | ICD-10-CM | POA: Diagnosis not present

## 2014-10-20 DIAGNOSIS — M6281 Muscle weakness (generalized): Secondary | ICD-10-CM | POA: Diagnosis not present

## 2014-10-20 DIAGNOSIS — R269 Unspecified abnormalities of gait and mobility: Secondary | ICD-10-CM | POA: Diagnosis not present

## 2014-10-20 DIAGNOSIS — R262 Difficulty in walking, not elsewhere classified: Secondary | ICD-10-CM | POA: Diagnosis not present

## 2014-10-20 DIAGNOSIS — H4011X3 Primary open-angle glaucoma, severe stage: Secondary | ICD-10-CM | POA: Diagnosis not present

## 2014-10-20 DIAGNOSIS — R296 Repeated falls: Secondary | ICD-10-CM | POA: Diagnosis not present

## 2014-10-22 DIAGNOSIS — R262 Difficulty in walking, not elsewhere classified: Secondary | ICD-10-CM | POA: Diagnosis not present

## 2014-10-22 DIAGNOSIS — R296 Repeated falls: Secondary | ICD-10-CM | POA: Diagnosis not present

## 2014-10-22 DIAGNOSIS — M6281 Muscle weakness (generalized): Secondary | ICD-10-CM | POA: Diagnosis not present

## 2014-10-22 DIAGNOSIS — R269 Unspecified abnormalities of gait and mobility: Secondary | ICD-10-CM | POA: Diagnosis not present

## 2014-10-26 DIAGNOSIS — E118 Type 2 diabetes mellitus with unspecified complications: Secondary | ICD-10-CM | POA: Diagnosis not present

## 2014-10-26 DIAGNOSIS — I10 Essential (primary) hypertension: Secondary | ICD-10-CM | POA: Diagnosis not present

## 2014-10-26 DIAGNOSIS — E789 Disorder of lipoprotein metabolism, unspecified: Secondary | ICD-10-CM | POA: Diagnosis not present

## 2014-10-27 DIAGNOSIS — R262 Difficulty in walking, not elsewhere classified: Secondary | ICD-10-CM | POA: Diagnosis not present

## 2014-10-27 DIAGNOSIS — M6281 Muscle weakness (generalized): Secondary | ICD-10-CM | POA: Diagnosis not present

## 2014-10-27 DIAGNOSIS — R296 Repeated falls: Secondary | ICD-10-CM | POA: Diagnosis not present

## 2014-10-27 DIAGNOSIS — R269 Unspecified abnormalities of gait and mobility: Secondary | ICD-10-CM | POA: Diagnosis not present

## 2014-10-29 DIAGNOSIS — R269 Unspecified abnormalities of gait and mobility: Secondary | ICD-10-CM | POA: Diagnosis not present

## 2014-10-29 DIAGNOSIS — R296 Repeated falls: Secondary | ICD-10-CM | POA: Diagnosis not present

## 2014-10-29 DIAGNOSIS — R262 Difficulty in walking, not elsewhere classified: Secondary | ICD-10-CM | POA: Diagnosis not present

## 2014-10-29 DIAGNOSIS — M6281 Muscle weakness (generalized): Secondary | ICD-10-CM | POA: Diagnosis not present

## 2014-11-06 DIAGNOSIS — R262 Difficulty in walking, not elsewhere classified: Secondary | ICD-10-CM | POA: Diagnosis not present

## 2014-11-06 DIAGNOSIS — R296 Repeated falls: Secondary | ICD-10-CM | POA: Diagnosis not present

## 2014-11-06 DIAGNOSIS — M6281 Muscle weakness (generalized): Secondary | ICD-10-CM | POA: Diagnosis not present

## 2014-11-06 DIAGNOSIS — R269 Unspecified abnormalities of gait and mobility: Secondary | ICD-10-CM | POA: Diagnosis not present

## 2014-11-10 DIAGNOSIS — R269 Unspecified abnormalities of gait and mobility: Secondary | ICD-10-CM | POA: Diagnosis not present

## 2014-11-10 DIAGNOSIS — R296 Repeated falls: Secondary | ICD-10-CM | POA: Diagnosis not present

## 2014-11-10 DIAGNOSIS — M6281 Muscle weakness (generalized): Secondary | ICD-10-CM | POA: Diagnosis not present

## 2014-11-10 DIAGNOSIS — R262 Difficulty in walking, not elsewhere classified: Secondary | ICD-10-CM | POA: Diagnosis not present

## 2014-11-17 DIAGNOSIS — R296 Repeated falls: Secondary | ICD-10-CM | POA: Diagnosis not present

## 2014-11-17 DIAGNOSIS — R269 Unspecified abnormalities of gait and mobility: Secondary | ICD-10-CM | POA: Diagnosis not present

## 2014-11-17 DIAGNOSIS — M6281 Muscle weakness (generalized): Secondary | ICD-10-CM | POA: Diagnosis not present

## 2014-11-17 DIAGNOSIS — R262 Difficulty in walking, not elsewhere classified: Secondary | ICD-10-CM | POA: Diagnosis not present

## 2014-11-24 ENCOUNTER — Ambulatory Visit (INDEPENDENT_AMBULATORY_CARE_PROVIDER_SITE_OTHER): Payer: Medicare Other | Admitting: Nurse Practitioner

## 2014-11-24 ENCOUNTER — Encounter: Payer: Self-pay | Admitting: Nurse Practitioner

## 2014-11-24 VITALS — BP 140/60 | HR 68 | Ht 70.0 in | Wt 160.8 lb

## 2014-11-24 DIAGNOSIS — R413 Other amnesia: Secondary | ICD-10-CM

## 2014-11-24 MED ORDER — LEVETIRACETAM 500 MG PO TABS: 500.0000 mg | ORAL_TABLET | Freq: Two times a day (BID) | ORAL | Status: DC

## 2014-11-24 MED ORDER — MEMANTINE HCL ER 28 MG PO CP24: 28.0000 mg | ORAL_CAPSULE | Freq: Every day | ORAL | Status: DC

## 2014-11-24 NOTE — Patient Instructions (Addendum)
Continue Namenda XR 28 mg daily will refill With history of seizures Aricept and Exelon cannot be used Continue Keppra as ordered for seizure disorder Given examples of exercises for the brain 8 to 10 glasses of water daily to keep well hydrated Follow-up in 6 months

## 2014-11-24 NOTE — Progress Notes (Signed)
I have read the note, and I agree with the clinical assessment and plan.  [name]   
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
## 2014-11-25 DIAGNOSIS — M6281 Muscle weakness (generalized): Secondary | ICD-10-CM | POA: Diagnosis not present

## 2014-11-25 DIAGNOSIS — R269 Unspecified abnormalities of gait and mobility: Secondary | ICD-10-CM | POA: Diagnosis not present

## 2014-11-25 DIAGNOSIS — R296 Repeated falls: Secondary | ICD-10-CM | POA: Diagnosis not present

## 2014-11-25 DIAGNOSIS — R262 Difficulty in walking, not elsewhere classified: Secondary | ICD-10-CM | POA: Diagnosis not present

## 2014-12-03 DIAGNOSIS — R296 Repeated falls: Secondary | ICD-10-CM | POA: Diagnosis not present

## 2014-12-03 DIAGNOSIS — R262 Difficulty in walking, not elsewhere classified: Secondary | ICD-10-CM | POA: Diagnosis not present

## 2014-12-03 DIAGNOSIS — M6281 Muscle weakness (generalized): Secondary | ICD-10-CM | POA: Diagnosis not present

## 2014-12-03 DIAGNOSIS — R269 Unspecified abnormalities of gait and mobility: Secondary | ICD-10-CM | POA: Diagnosis not present

## 2014-12-10 DIAGNOSIS — R262 Difficulty in walking, not elsewhere classified: Secondary | ICD-10-CM | POA: Diagnosis not present

## 2014-12-10 DIAGNOSIS — R269 Unspecified abnormalities of gait and mobility: Secondary | ICD-10-CM | POA: Diagnosis not present

## 2014-12-10 DIAGNOSIS — R296 Repeated falls: Secondary | ICD-10-CM | POA: Diagnosis not present

## 2014-12-10 DIAGNOSIS — M6281 Muscle weakness (generalized): Secondary | ICD-10-CM | POA: Diagnosis not present

## 2014-12-17 DIAGNOSIS — R262 Difficulty in walking, not elsewhere classified: Secondary | ICD-10-CM | POA: Diagnosis not present

## 2014-12-17 DIAGNOSIS — M6281 Muscle weakness (generalized): Secondary | ICD-10-CM | POA: Diagnosis not present

## 2014-12-17 DIAGNOSIS — R296 Repeated falls: Secondary | ICD-10-CM | POA: Diagnosis not present

## 2014-12-17 DIAGNOSIS — R269 Unspecified abnormalities of gait and mobility: Secondary | ICD-10-CM | POA: Diagnosis not present

## 2014-12-23 DIAGNOSIS — R296 Repeated falls: Secondary | ICD-10-CM | POA: Diagnosis not present

## 2014-12-23 DIAGNOSIS — M6281 Muscle weakness (generalized): Secondary | ICD-10-CM | POA: Diagnosis not present

## 2014-12-23 DIAGNOSIS — R262 Difficulty in walking, not elsewhere classified: Secondary | ICD-10-CM | POA: Diagnosis not present

## 2014-12-23 DIAGNOSIS — R269 Unspecified abnormalities of gait and mobility: Secondary | ICD-10-CM | POA: Diagnosis not present

## 2015-01-06 DIAGNOSIS — M6281 Muscle weakness (generalized): Secondary | ICD-10-CM | POA: Diagnosis not present

## 2015-01-06 DIAGNOSIS — R262 Difficulty in walking, not elsewhere classified: Secondary | ICD-10-CM | POA: Diagnosis not present

## 2015-01-06 DIAGNOSIS — R269 Unspecified abnormalities of gait and mobility: Secondary | ICD-10-CM | POA: Diagnosis not present

## 2015-01-06 DIAGNOSIS — R296 Repeated falls: Secondary | ICD-10-CM | POA: Diagnosis not present

## 2015-01-11 ENCOUNTER — Telehealth: Payer: Self-pay | Admitting: Neurology

## 2015-01-11 NOTE — Telephone Encounter (Signed)
yes

## 2015-01-11 NOTE — Telephone Encounter (Signed)
Pt's wife called and states that medication Namenda is over $130 for 30 pills. She would like to know if there is a generic that can be used. Please call and advise [phone]

## 2015-01-12 NOTE — Telephone Encounter (Signed)
I called the patient's wife and advised she can call her pharmacy and ask for the generic. Per Hoyle Sauer, NP, generic is okay.

## 2015-01-19 ENCOUNTER — Other Ambulatory Visit: Payer: Self-pay

## 2015-01-19 MED ORDER — MEMANTINE HCL 10 MG PO TABS: 10.0000 mg | ORAL_TABLET | Freq: Two times a day (BID) | ORAL | Status: DC

## 2015-01-19 NOTE — Telephone Encounter (Signed)
Rx updated and sent per notes.  I called back.  They expressed understanding of change, and will call us back if anything further is needed.

## 2015-01-19 NOTE — Telephone Encounter (Signed)
Updated per note from 10/24

## 2015-01-19 NOTE — Telephone Encounter (Addendum)
Pt's wife called sts pt is taking namenda XR (brand), pharmacist said the generic regular namenda would cost about $14/mth. Can RX be changed to reflect this. Please call and advise at [phone] or [phone]. Pt is our of medication as of today.

## 2015-01-19 NOTE — Progress Notes (Signed)
Patient ID: [name], male   DOB: 03/27/1945, 69 y.o.   MRN: [id number] [name] is a 69 y.o. M with history of DM, CKD stage II, HTN, seizure disorder, and multiple prior episodes of osteomyelitis with recent complications who presented to [facility] 11/26/2012 with hypoglycemia with development of respiratory distress. He was found to have EF of 30-35% by echo thus we are consulted. In July 2014, he underwent surgery on his R foot with placement of antibiotic beads for osteomyelitis. He was admitted 8/5 with severe sepsis complicated by AKI, toxic metabolic encephalopathy with uremia, and possible surgical foot infection requiring I&D. He was discharged with plan for 6 weeks abx but returned 8/30 with confusion, fever, cough and was found to have HCAP, sepsis, and severe anemia with Hgb of 6 later felt due to AOCD + bone marrow suppression from sepsis. He was discharged on 11/25/12 with plan to complete PICC Vanc & oral Cipro but returned the following day on 11/26/12 with hypoglycemia and hypothermia. That day he had taken his insulin, ate his breakfast, got in bed to take a nap and complained of feeling hot. His family checked on him and found him drenched in sweat and disoriented. CBG was 48 and EMS was called - despite food and juice, CBG via EMS was 26 requiring glucagon and D50. CXR showed persistent opacities so antibiotic coverage was expanded. On 11/29/12 he developed acute decompensation with n/v, change in mental status, and hypoxemic respiratory failure requiring intubation ?aspiration event. He also had severe hypoalbuminemia 1.6, troponins neg x 3. D-dimer 1.95 with LE dopp neg for DVT. He was also treated with IV Lasix for 4 days which was stopped due to concern for getting dry with sinus tach. He self-extubated on 9/13 and is now satting normally on RA. As part of his eval, 2D echo was obtained showing mildly dilated LV, EF 30-35% with +WMA as below, grade 1 diastolic dysfunction, mild MR,  severely dilated LA, PA Pressure 29mmHg.   Since hospital d/c improving Still has a tenuous situation with his RLE in cast and non weight bearing.  Limited activity so no chest pain or dyspnea.  Seeing neuro for memory issues from previous left occipital infarct and lacunar infarcts.  Started on Namenda  02/11/13 non ischemic myovue but consistent with ischemic DCM Overall Impression: Intermediate risk stress nuclear study with primarily fixed, medium-sized basal to mid anterior and primarily fixed, small basal inferior perfusion defects. There does not appear to be significant ischemia (intermediate risk because of EF). Difficult study because of significant patient motion, therefore defects may be artifactual.   LV Ejection Fraction: 36%. LV Wall Motion: Global hypokinesis.   Echo 07/14/14 Study Conclusions  - Left ventricle: Diffuse hypokinesis worse in mid and basal inferior wall abnormal septal motion. The cavity size was mildly dilated. Wall thickness was normal. Systolic function was moderately to severely reduced. The estimated ejection fraction was in the range of 30% to 35%. - Mitral valve: There was mild regurgitation. - Atrial septum: No defect or patent foramen ovale was identified.   03/15/13 Had right BKA   Doing well no chest pain or CHF  Working out at [employer] and ambulates well with prosthetic leg  ROS: Denies fever, malais, weight loss, blurry vision, decreased visual acuity, cough, sputum, SOB, hemoptysis, pleuritic pain, palpitaitons, heartburn, abdominal pain, melena, lower extremity edema, claudication, or rash.  All other systems reviewed and negative  General: Affect appropriate Chronically ill black male HEENT: normal  Neck supple with no adenopathy JVP normal left  bruits no thyromegaly Lungs clear with no wheezing and good diaphragmatic motion Heart:  S1/S2 no murmur, no rub, gallop or click PMI normal Abdomen: benighn, BS positve, no  tenderness, no AAA no bruit.  No HSM or HJR Distal pulses intact to popliteal S/P left BKA  Right leg in boot with healing infection in foot No edema Neuro non-focal Skin warm and dry LE weakness    Current Outpatient Prescriptions  Medication Sig Dispense Refill  . aspirin 325 MG tablet Take 325 mg by mouth daily.    [name] atorvastatin (LIPITOR) 20 MG tablet Take 20 mg by mouth daily.    . carvedilol (COREG) 6.25 MG tablet Take 1 tablet (6.25 mg total) by mouth 2 (two) times daily with a meal. 60 tablet 0  . hydrochlorothiazide (HYDRODIURIL) 50 MG tablet Take 50 mg by mouth daily.    . insulin detemir (LEVEMIR) 100 UNIT/ML injection Inject 10 Units into the skin at bedtime.     . levETIRAcetam (KEPPRA) 500 MG tablet Take 1 tablet (500 mg total) by mouth 2 (two) times daily. 60 tablet 6  . memantine (NAMENDA) 10 MG tablet Take 1 tablet (10 mg total) by mouth 2 (two) times daily. 60 tablet 6  . Multiple Vitamins-Minerals (CENTRUM SILVER ADULT 50+ PO) Take 1 tablet by mouth daily.     [name] senna (SENOKOT TO GO) 8.6 MG tablet Take 1 tablet by mouth daily.    . tamsulosin (FLOMAX) 0.4 MG CAPS capsule Take 1 capsule (0.4 mg total) by mouth daily after supper. 30 capsule 5  . timolol (BETIMOL) 0.5 % ophthalmic solution Place 1 drop into both eyes every morning.     . Travoprost, BAK Free, ([name]) 0.004 % SOLN ophthalmic solution Place 1 drop into both eyes at bedtime.    [name] venlafaxine XR (EFFEXOR-XR) 75 MG 24 hr capsule Take 75 mg by mouth daily.    . vitamin C (ASCORBIC ACID) 500 MG tablet Take 500 mg by mouth daily.     No current facility-administered medications for this visit.    Allergies  Codeine and Penicillins  Electrocardiogram:  12/06/13 SR rate 89 inferior T wave changes 07/14/14  SR rate 65  LVH otherwise normal  Assessment and Plan CAD:   Stable with no angina and good activity level.  Continue medical Rx CHF:  Euvolemic  EF 35% by 06/2014  reviewed and no change from 2013    PVD:  F/u vascular stump looks good with no ulcers and no longer seeing ID or on antibiotics Chol:  On statin  Cholesterol is at goal.  Continue current dose of statin and diet Rx.  No myalgias or side effects.  F/U  LFT's in 6 months. Lab Results  Component Value Date   LDLCALC 73 12/04/2012  Left Bruit:  F/u duplex in 6 months          Echo and Carotid in 6 months  [name]

## 2015-01-20 ENCOUNTER — Ambulatory Visit (INDEPENDENT_AMBULATORY_CARE_PROVIDER_SITE_OTHER): Payer: Medicare Other | Admitting: Cardiovascular Disease

## 2015-01-20 ENCOUNTER — Encounter: Payer: Self-pay | Admitting: Cardiovascular Disease

## 2015-01-20 VITALS — BP 130/56 | HR 67 | Ht 70.0 in | Wt 160.4 lb

## 2015-01-20 DIAGNOSIS — R0989 Other specified symptoms and signs involving the circulatory and respiratory systems: Secondary | ICD-10-CM | POA: Diagnosis not present

## 2015-01-20 DIAGNOSIS — I5022 Chronic systolic (congestive) heart failure: Secondary | ICD-10-CM

## 2015-01-20 NOTE — Patient Instructions (Signed)
Medication Instructions:  Your physician recommends that you continue on your current medications as directed. Please refer to the Current Medication list given to you today.   Labwork: NONE  Testing/Procedures: Your physician has requested that you have an echocardiogram. Echocardiography is a painless test that uses sound waves to create images of your heart. It provides your doctor with information about the size and shape of your heart and how well your heart's chambers and valves are working. This procedure takes approximately one hour. There are no restrictions for this procedure. IN  6 MONTHS   Your physician has requested that you have a carotid duplex. This test is an ultrasound of the carotid arteries in your neck. It looks at blood flow through these arteries that supply the brain with blood. Allow one hour for this exam. There are no restrictions or special instructions. 6 MONTHS Follow-Up: Your physician recommends that you schedule a follow-up appointment in: [place]   ECHO  AND  CAROTID  SAME DAY  AR NORTHLINE  Any Other Special Instructions Will Be Listed Below (If Applicable).     If you need a refill on your cardiac medications before your next appointment, please call your pharmacy.

## 2015-01-20 NOTE — Addendum Note (Signed)
Addended by: Devra Dopp E on: 01/20/2015 08:55 AM   Modules accepted: Orders

## 2015-01-26 DIAGNOSIS — R269 Unspecified abnormalities of gait and mobility: Secondary | ICD-10-CM | POA: Diagnosis not present

## 2015-01-26 DIAGNOSIS — M6281 Muscle weakness (generalized): Secondary | ICD-10-CM | POA: Diagnosis not present

## 2015-01-26 DIAGNOSIS — R262 Difficulty in walking, not elsewhere classified: Secondary | ICD-10-CM | POA: Diagnosis not present

## 2015-01-26 DIAGNOSIS — R296 Repeated falls: Secondary | ICD-10-CM | POA: Diagnosis not present

## 2015-02-01 ENCOUNTER — Other Ambulatory Visit: Payer: Self-pay

## 2015-02-01 ENCOUNTER — Ambulatory Visit (HOSPITAL_COMMUNITY): Payer: Medicare Other | Attending: Cardiovascular Disease

## 2015-02-01 DIAGNOSIS — I129 Hypertensive chronic kidney disease with stage 1 through stage 4 chronic kidney disease, or unspecified chronic kidney disease: Secondary | ICD-10-CM | POA: Diagnosis not present

## 2015-02-01 DIAGNOSIS — I5022 Chronic systolic (congestive) heart failure: Secondary | ICD-10-CM | POA: Insufficient documentation

## 2015-02-01 DIAGNOSIS — E119 Type 2 diabetes mellitus without complications: Secondary | ICD-10-CM | POA: Insufficient documentation

## 2015-02-01 DIAGNOSIS — N183 Chronic kidney disease, stage 3 (moderate): Secondary | ICD-10-CM | POA: Diagnosis not present

## 2015-02-02 ENCOUNTER — Telehealth: Payer: Self-pay | Admitting: Cardiovascular Disease

## 2015-02-02 NOTE — Telephone Encounter (Signed)
New Message  RN from [facility] callint to speak w/ RN- pt is identified to be able to participate in CHF program. RN is calling to see if pt has that diagnosis. Please call back and discuss.

## 2015-02-02 NOTE — Telephone Encounter (Signed)
Left message for [name] that pt does carry the DX of CHF and to c/b with further questions/concerns.

## 2015-02-08 DIAGNOSIS — M6281 Muscle weakness (generalized): Secondary | ICD-10-CM | POA: Diagnosis not present

## 2015-02-08 DIAGNOSIS — R269 Unspecified abnormalities of gait and mobility: Secondary | ICD-10-CM | POA: Diagnosis not present

## 2015-02-08 DIAGNOSIS — R296 Repeated falls: Secondary | ICD-10-CM | POA: Diagnosis not present

## 2015-02-08 DIAGNOSIS — R262 Difficulty in walking, not elsewhere classified: Secondary | ICD-10-CM | POA: Diagnosis not present

## 2015-02-16 DIAGNOSIS — E875 Hyperkalemia: Secondary | ICD-10-CM | POA: Diagnosis not present

## 2015-02-16 DIAGNOSIS — D649 Anemia, unspecified: Secondary | ICD-10-CM | POA: Diagnosis not present

## 2015-02-16 DIAGNOSIS — N183 Chronic kidney disease, stage 3 (moderate): Secondary | ICD-10-CM | POA: Diagnosis not present

## 2015-02-16 DIAGNOSIS — E119 Type 2 diabetes mellitus without complications: Secondary | ICD-10-CM | POA: Diagnosis not present

## 2015-02-16 DIAGNOSIS — I129 Hypertensive chronic kidney disease with stage 1 through stage 4 chronic kidney disease, or unspecified chronic kidney disease: Secondary | ICD-10-CM | POA: Diagnosis not present

## 2015-02-18 DIAGNOSIS — E118 Type 2 diabetes mellitus with unspecified complications: Secondary | ICD-10-CM | POA: Diagnosis not present

## 2015-02-22 DIAGNOSIS — R269 Unspecified abnormalities of gait and mobility: Secondary | ICD-10-CM | POA: Diagnosis not present

## 2015-02-22 DIAGNOSIS — M6281 Muscle weakness (generalized): Secondary | ICD-10-CM | POA: Diagnosis not present

## 2015-02-22 DIAGNOSIS — R296 Repeated falls: Secondary | ICD-10-CM | POA: Diagnosis not present

## 2015-02-22 DIAGNOSIS — R262 Difficulty in walking, not elsewhere classified: Secondary | ICD-10-CM | POA: Diagnosis not present

## 2015-02-25 DIAGNOSIS — E118 Type 2 diabetes mellitus with unspecified complications: Secondary | ICD-10-CM | POA: Diagnosis not present

## 2015-02-25 DIAGNOSIS — I10 Essential (primary) hypertension: Secondary | ICD-10-CM | POA: Diagnosis not present

## 2015-02-25 DIAGNOSIS — E789 Disorder of lipoprotein metabolism, unspecified: Secondary | ICD-10-CM | POA: Diagnosis not present

## 2015-03-08 DIAGNOSIS — R296 Repeated falls: Secondary | ICD-10-CM | POA: Diagnosis not present

## 2015-03-08 DIAGNOSIS — M6281 Muscle weakness (generalized): Secondary | ICD-10-CM | POA: Diagnosis not present

## 2015-03-08 DIAGNOSIS — R262 Difficulty in walking, not elsewhere classified: Secondary | ICD-10-CM | POA: Diagnosis not present

## 2015-03-08 DIAGNOSIS — R269 Unspecified abnormalities of gait and mobility: Secondary | ICD-10-CM | POA: Diagnosis not present

## 2015-03-26 DIAGNOSIS — H401113 Primary open-angle glaucoma, right eye, severe stage: Secondary | ICD-10-CM | POA: Diagnosis not present

## 2015-03-26 DIAGNOSIS — H5340 Unspecified visual field defects: Secondary | ICD-10-CM | POA: Diagnosis not present

## 2015-03-26 DIAGNOSIS — H401123 Primary open-angle glaucoma, left eye, severe stage: Secondary | ICD-10-CM | POA: Diagnosis not present

## 2015-03-26 DIAGNOSIS — H35372 Puckering of macula, left eye: Secondary | ICD-10-CM | POA: Diagnosis not present

## 2015-05-24 ENCOUNTER — Encounter: Payer: Self-pay | Admitting: Nurse Practitioner

## 2015-05-24 ENCOUNTER — Ambulatory Visit (INDEPENDENT_AMBULATORY_CARE_PROVIDER_SITE_OTHER): Payer: Medicare Other | Admitting: Nurse Practitioner

## 2015-05-24 ENCOUNTER — Ambulatory Visit: Payer: Medicare Other | Admitting: Nurse Practitioner

## 2015-05-24 VITALS — BP 140/70 | HR 72 | Ht 70.0 in | Wt 153.0 lb

## 2015-05-24 DIAGNOSIS — G40909 Epilepsy, unspecified, not intractable, without status epilepticus: Secondary | ICD-10-CM

## 2015-05-24 DIAGNOSIS — R413 Other amnesia: Secondary | ICD-10-CM | POA: Diagnosis not present

## 2015-05-24 MED ORDER — LEVETIRACETAM 500 MG PO TABS: 500.0000 mg | ORAL_TABLET | Freq: Two times a day (BID) | ORAL | Status: DC

## 2015-05-24 MED ORDER — MEMANTINE HCL 10 MG PO TABS: 10.0000 mg | ORAL_TABLET | Freq: Two times a day (BID) | ORAL | Status: DC

## 2015-05-24 NOTE — Progress Notes (Signed)
I have read the note, and I agree with the clinical assessment and plan.  [name],CHARLES KEITH   
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
## 2015-05-24 NOTE — Patient Instructions (Signed)
Continue Namenda 10MG  bid will refill With history of seizures Aricept and Exelon cannot be used Continue Keppra as ordered for seizure disorder will refill F/U in 6 months Next with Dr. Jannifer Franklin

## 2015-06-02 ENCOUNTER — Encounter (HOSPITAL_COMMUNITY)
Admission: AD | Disposition: A | Discharge status: Home / Self Care | Payer: Self-pay | Source: Ambulatory Visit | Attending: Orthopedic Surgery

## 2015-06-02 ENCOUNTER — Inpatient Hospital Stay (HOSPITAL_COMMUNITY): Payer: Medicare Other

## 2015-06-02 ENCOUNTER — Inpatient Hospital Stay (HOSPITAL_COMMUNITY): Payer: Medicare Other | Admitting: Certified Registered Nurse Anesthetist

## 2015-06-02 ENCOUNTER — Other Ambulatory Visit: Payer: Self-pay | Admitting: Orthopedic Surgery

## 2015-06-02 ENCOUNTER — Encounter (HOSPITAL_COMMUNITY): Payer: Self-pay | Admitting: *Deleted

## 2015-06-02 ENCOUNTER — Observation Stay (HOSPITAL_COMMUNITY)
Admission: AD | Admit: 2015-06-02 | Discharge: 2015-06-03 | Disposition: A | Discharge status: Home / Self Care | Payer: Medicare Other | Source: Ambulatory Visit | Attending: Orthopedic Surgery | Admitting: Orthopedic Surgery

## 2015-06-02 DIAGNOSIS — Z79899 Other long term (current) drug therapy: Secondary | ICD-10-CM | POA: Insufficient documentation

## 2015-06-02 DIAGNOSIS — Z880 Allergy status to penicillin: Secondary | ICD-10-CM | POA: Diagnosis not present

## 2015-06-02 DIAGNOSIS — W19XXXA Unspecified fall, initial encounter: Secondary | ICD-10-CM | POA: Insufficient documentation

## 2015-06-02 DIAGNOSIS — I129 Hypertensive chronic kidney disease with stage 1 through stage 4 chronic kidney disease, or unspecified chronic kidney disease: Secondary | ICD-10-CM | POA: Diagnosis not present

## 2015-06-02 DIAGNOSIS — E1122 Type 2 diabetes mellitus with diabetic chronic kidney disease: Secondary | ICD-10-CM | POA: Insufficient documentation

## 2015-06-02 DIAGNOSIS — S72002A Fracture of unspecified part of neck of left femur, initial encounter for closed fracture: Principal | ICD-10-CM | POA: Insufficient documentation

## 2015-06-02 DIAGNOSIS — N182 Chronic kidney disease, stage 2 (mild): Secondary | ICD-10-CM | POA: Insufficient documentation

## 2015-06-02 DIAGNOSIS — N189 Chronic kidney disease, unspecified: Secondary | ICD-10-CM | POA: Diagnosis not present

## 2015-06-02 DIAGNOSIS — Z794 Long term (current) use of insulin: Secondary | ICD-10-CM | POA: Diagnosis not present

## 2015-06-02 DIAGNOSIS — Z7982 Long term (current) use of aspirin: Secondary | ICD-10-CM | POA: Insufficient documentation

## 2015-06-02 DIAGNOSIS — Z89512 Acquired absence of left leg below knee: Secondary | ICD-10-CM | POA: Diagnosis not present

## 2015-06-02 DIAGNOSIS — I739 Peripheral vascular disease, unspecified: Secondary | ICD-10-CM | POA: Diagnosis not present

## 2015-06-02 DIAGNOSIS — S72042D Displaced fracture of base of neck of left femur, subsequent encounter for closed fracture with routine healing: Secondary | ICD-10-CM | POA: Diagnosis not present

## 2015-06-02 DIAGNOSIS — S72012A Unspecified intracapsular fracture of left femur, initial encounter for closed fracture: Secondary | ICD-10-CM | POA: Diagnosis not present

## 2015-06-02 DIAGNOSIS — S72042A Displaced fracture of base of neck of left femur, initial encounter for closed fracture: Secondary | ICD-10-CM | POA: Diagnosis not present

## 2015-06-02 DIAGNOSIS — Z8673 Personal history of transient ischemic attack (TIA), and cerebral infarction without residual deficits: Secondary | ICD-10-CM | POA: Insufficient documentation

## 2015-06-02 HISTORY — PX: HIP PINNING,CANNULATED: SHX1758

## 2015-06-02 LAB — COMPREHENSIVE METABOLIC PANEL
ALT: 19 U/L (ref 17–63)
AST: 9 U/L — AB (ref 15–41)
Albumin: 3.3 g/dL — ABNORMAL LOW (ref 3.5–5.0)
Alkaline Phosphatase: 67 U/L (ref 38–126)
Anion gap: 11 (ref 5–15)
BILIRUBIN TOTAL: 0.3 mg/dL (ref 0.3–1.2)
BUN: 64 mg/dL — AB (ref 6–20)
CHLORIDE: 106 mmol/L (ref 101–111)
CO2: 25 mmol/L (ref 22–32)
Calcium: 9.2 mg/dL (ref 8.9–10.3)
Creatinine, Ser: 2.34 mg/dL — ABNORMAL HIGH (ref 0.61–1.24)
GFR calc Af Amer: 31 mL/min — ABNORMAL LOW (ref >60)
GFR calc non Af Amer: 27 mL/min — ABNORMAL LOW (ref >60)
GLUCOSE: 291 mg/dL — AB (ref 65–99)
POTASSIUM: 4.7 mmol/L (ref 3.5–5.1)
Sodium: 142 mmol/L (ref 135–145)
TOTAL PROTEIN: 6.3 g/dL — AB (ref 6.5–8.1)

## 2015-06-02 LAB — CBC
HEMATOCRIT: 33 % — AB (ref 39.0–52.0)
HEMOGLOBIN: 10.6 g/dL — AB (ref 13.0–17.0)
MCH: 28 pg (ref 26.0–34.0)
MCHC: 32.1 g/dL (ref 30.0–36.0)
MCV: 87.1 fL (ref 78.0–100.0)
Platelets: 160 10*3/uL (ref 150–400)
RBC: 3.79 MIL/uL — AB (ref 4.22–5.81)
RDW: 12 % (ref 11.5–15.5)
WBC: 10.2 10*3/uL (ref 4.0–10.5)

## 2015-06-02 LAB — APTT: aPTT: 27 seconds (ref 24–37)

## 2015-06-02 LAB — PROTIME-INR
INR: 1.08 (ref 0.00–1.49)
Prothrombin Time: 14.2 seconds (ref 11.6–15.2)

## 2015-06-02 LAB — GLUCOSE, CAPILLARY
GLUCOSE-CAPILLARY: 171 mg/dL — AB (ref 65–99)
Glucose-Capillary: 179 mg/dL — ABNORMAL HIGH (ref 65–99)

## 2015-06-02 SURGERY — FIXATION, FEMUR, NECK, PERCUTANEOUS, USING SCREW
Anesthesia: Monitor Anesthesia Care | Site: Hip | Laterality: Left

## 2015-06-02 MED ORDER — PROPOFOL 10 MG/ML IV BOLUS
INTRAVENOUS | Status: AC
  Filled 2015-06-02: qty 20

## 2015-06-02 MED ORDER — LIDOCAINE HCL (CARDIAC) 20 MG/ML IV SOLN
INTRAVENOUS | Status: AC
  Filled 2015-06-02: qty 15

## 2015-06-02 MED ORDER — TIMOLOL MALEATE 0.5 % OP SOLN
1.0000 [drp] | Freq: Two times a day (BID) | OPHTHALMIC | Status: DC
  Administered 2015-06-02 – 2015-06-03 (×2): 1 [drp] via OPHTHALMIC
  Filled 2015-06-02: qty 5

## 2015-06-02 MED ORDER — CLINDAMYCIN PHOSPHATE 600 MG/50ML IV SOLN
600.0000 mg | Freq: Four times a day (QID) | INTRAVENOUS | Status: AC
  Administered 2015-06-03 (×2): 600 mg via INTRAVENOUS
  Filled 2015-06-02 (×2): qty 50

## 2015-06-02 MED ORDER — PROPOFOL 500 MG/50ML IV EMUL
INTRAVENOUS | Status: DC | PRN
  Administered 2015-06-02: 10 ug/kg/min via INTRAVENOUS

## 2015-06-02 MED ORDER — LATANOPROST 0.005 % OP SOLN
1.0000 [drp] | Freq: Every day | OPHTHALMIC | Status: DC
  Administered 2015-06-02: 1 [drp] via OPHTHALMIC
  Filled 2015-06-02: qty 2.5

## 2015-06-02 MED ORDER — CARVEDILOL 6.25 MG PO TABS
6.2500 mg | ORAL_TABLET | Freq: Two times a day (BID) | ORAL | Status: DC
  Administered 2015-06-03: 6.25 mg via ORAL
  Filled 2015-06-02: qty 1

## 2015-06-02 MED ORDER — INSULIN ASPART 100 UNIT/ML ~~LOC~~ SOLN
3.0000 [IU] | Freq: Three times a day (TID) | SUBCUTANEOUS | Status: DC
  Administered 2015-06-03: 3 [IU] via SUBCUTANEOUS

## 2015-06-02 MED ORDER — FENTANYL CITRATE (PF) 100 MCG/2ML IJ SOLN
25.0000 ug | INTRAMUSCULAR | Status: DC | PRN
  Administered 2015-06-02: 50 ug via INTRAVENOUS

## 2015-06-02 MED ORDER — ACETAMINOPHEN 650 MG RE SUPP: 650.0000 mg | Freq: Four times a day (QID) | RECTAL | Status: DC | PRN

## 2015-06-02 MED ORDER — LACTATED RINGERS IV SOLN
INTRAVENOUS | Status: DC
  Administered 2015-06-02: 17:00:00 via INTRAVENOUS

## 2015-06-02 MED ORDER — CHLORHEXIDINE GLUCONATE 4 % EX LIQD: 60.0000 mL | Freq: Once | CUTANEOUS | Status: DC

## 2015-06-02 MED ORDER — ROCURONIUM BROMIDE 50 MG/5ML IV SOLN
INTRAVENOUS | Status: AC
  Filled 2015-06-02: qty 1

## 2015-06-02 MED ORDER — LIDOCAINE HCL (CARDIAC) 20 MG/ML IV SOLN
INTRAVENOUS | Status: AC
  Filled 2015-06-02: qty 5

## 2015-06-02 MED ORDER — OXYCODONE HCL 5 MG PO TABS: 5.0000 mg | ORAL_TABLET | Freq: Once | ORAL | Status: DC | PRN

## 2015-06-02 MED ORDER — CLINDAMYCIN PHOSPHATE 900 MG/50ML IV SOLN
INTRAVENOUS | Status: AC
  Administered 2015-06-02: 900 mg via INTRAVENOUS
  Filled 2015-06-02: qty 50

## 2015-06-02 MED ORDER — ASPIRIN EC 325 MG PO TBEC
325.0000 mg | DELAYED_RELEASE_TABLET | Freq: Every day | ORAL | Status: DC
  Administered 2015-06-03: 325 mg via ORAL
  Filled 2015-06-02: qty 1

## 2015-06-02 MED ORDER — TAMSULOSIN HCL 0.4 MG PO CAPS
0.4000 mg | ORAL_CAPSULE | Freq: Every day | ORAL | Status: DC
Start: 2015-06-03 — End: 2015-06-03

## 2015-06-02 MED ORDER — CLINDAMYCIN PHOSPHATE 900 MG/50ML IV SOLN: 900.0000 mg | INTRAVENOUS | Status: DC

## 2015-06-02 MED ORDER — METHOCARBAMOL 1000 MG/10ML IJ SOLN
500.0000 mg | Freq: Four times a day (QID) | INTRAVENOUS | Status: DC | PRN
  Filled 2015-06-02: qty 5

## 2015-06-02 MED ORDER — PROPOFOL 500 MG/50ML IV EMUL: INTRAVENOUS | Status: DC | PRN

## 2015-06-02 MED ORDER — FENTANYL CITRATE (PF) 100 MCG/2ML IJ SOLN
INTRAMUSCULAR | Status: AC
Start: 2015-06-02 — End: 2015-06-03
  Filled 2015-06-02: qty 2

## 2015-06-02 MED ORDER — SODIUM CHLORIDE 0.9 % IV SOLN
INTRAVENOUS | Status: DC | PRN
  Administered 2015-06-02 (×2): via INTRAVENOUS

## 2015-06-02 MED ORDER — METHOCARBAMOL 500 MG PO TABS
500.0000 mg | ORAL_TABLET | Freq: Four times a day (QID) | ORAL | Status: DC | PRN
  Administered 2015-06-02 – 2015-06-03 (×2): 500 mg via ORAL
  Filled 2015-06-02 (×2): qty 1

## 2015-06-02 MED ORDER — ONDANSETRON HCL 4 MG/2ML IJ SOLN
INTRAMUSCULAR | Status: AC
  Filled 2015-06-02: qty 2

## 2015-06-02 MED ORDER — ACETAMINOPHEN 325 MG PO TABS: 325.0000 mg | ORAL_TABLET | ORAL | Status: DC | PRN

## 2015-06-02 MED ORDER — FENTANYL CITRATE (PF) 100 MCG/2ML IJ SOLN
INTRAMUSCULAR | Status: DC | PRN
  Administered 2015-06-02: 50 ug via INTRAVENOUS

## 2015-06-02 MED ORDER — INSULIN ASPART 100 UNIT/ML ~~LOC~~ SOLN
0.0000 [IU] | Freq: Three times a day (TID) | SUBCUTANEOUS | Status: DC
  Administered 2015-06-03: 3 [IU] via SUBCUTANEOUS

## 2015-06-02 MED ORDER — ACETAMINOPHEN 160 MG/5ML PO SOLN
325.0000 mg | ORAL | Status: DC | PRN
  Filled 2015-06-02: qty 20.3

## 2015-06-02 MED ORDER — HYDROCHLOROTHIAZIDE 25 MG PO TABS
50.0000 mg | ORAL_TABLET | Freq: Every day | ORAL | Status: DC
  Administered 2015-06-03: 50 mg via ORAL
  Filled 2015-06-02: qty 2

## 2015-06-02 MED ORDER — 0.9 % SODIUM CHLORIDE (POUR BTL) OPTIME
TOPICAL | Status: DC | PRN
Start: 2015-06-02 — End: 2015-06-02
  Administered 2015-06-02: 1000 mL

## 2015-06-02 MED ORDER — OXYCODONE HCL 5 MG/5ML PO SOLN: 5.0000 mg | Freq: Once | ORAL | Status: DC | PRN

## 2015-06-02 MED ORDER — MENTHOL 3 MG MT LOZG: 1.0000 | LOZENGE | OROMUCOSAL | Status: DC | PRN

## 2015-06-02 MED ORDER — LEVETIRACETAM 500 MG PO TABS
500.0000 mg | ORAL_TABLET | Freq: Two times a day (BID) | ORAL | Status: DC
  Administered 2015-06-02 – 2015-06-03 (×2): 500 mg via ORAL
  Filled 2015-06-02 (×2): qty 1

## 2015-06-02 MED ORDER — ONDANSETRON HCL 4 MG/2ML IJ SOLN
INTRAMUSCULAR | Status: DC | PRN
  Administered 2015-06-02: 4 mg via INTRAVENOUS

## 2015-06-02 MED ORDER — MEMANTINE HCL 10 MG PO TABS
10.0000 mg | ORAL_TABLET | Freq: Two times a day (BID) | ORAL | Status: DC
  Administered 2015-06-02 – 2015-06-03 (×2): 10 mg via ORAL
  Filled 2015-06-02 (×2): qty 1

## 2015-06-02 MED ORDER — HYDROMORPHONE HCL 1 MG/ML IJ SOLN: 1.0000 mg | INTRAMUSCULAR | Status: DC | PRN

## 2015-06-02 MED ORDER — TIMOLOL HEMIHYDRATE 0.5 % OP SOLN: 1.0000 [drp] | Freq: Two times a day (BID) | OPHTHALMIC | Status: DC

## 2015-06-02 MED ORDER — BUPIVACAINE IN DEXTROSE 0.75-8.25 % IT SOLN
INTRATHECAL | Status: DC | PRN
  Administered 2015-06-02: 1.4 mL via INTRATHECAL

## 2015-06-02 MED ORDER — FENTANYL CITRATE (PF) 250 MCG/5ML IJ SOLN
INTRAMUSCULAR | Status: AC
  Filled 2015-06-02: qty 5

## 2015-06-02 MED ORDER — MIDAZOLAM HCL 5 MG/5ML IJ SOLN
INTRAMUSCULAR | Status: DC | PRN
  Administered 2015-06-02: 1 mg via INTRAVENOUS

## 2015-06-02 MED ORDER — SODIUM CHLORIDE 0.9 % IV SOLN: INTRAVENOUS | Status: DC

## 2015-06-02 MED ORDER — METOCLOPRAMIDE HCL 5 MG/ML IJ SOLN: 5.0000 mg | Freq: Three times a day (TID) | INTRAMUSCULAR | Status: DC | PRN

## 2015-06-02 MED ORDER — ONDANSETRON HCL 4 MG/2ML IJ SOLN: 4.0000 mg | Freq: Four times a day (QID) | INTRAMUSCULAR | Status: DC | PRN

## 2015-06-02 MED ORDER — MIDAZOLAM HCL 2 MG/2ML IJ SOLN
INTRAMUSCULAR | Status: AC
  Filled 2015-06-02: qty 2

## 2015-06-02 MED ORDER — PHENOL 1.4 % MT LIQD
1.0000 | OROMUCOSAL | Status: DC | PRN
Start: 2015-06-02 — End: 2015-06-03

## 2015-06-02 MED ORDER — INSULIN DETEMIR 100 UNIT/ML ~~LOC~~ SOLN
14.0000 [IU] | Freq: Every day | SUBCUTANEOUS | Status: DC
  Administered 2015-06-02: 14 [IU] via SUBCUTANEOUS
  Filled 2015-06-02 (×2): qty 0.14

## 2015-06-02 MED ORDER — ACETAMINOPHEN 325 MG PO TABS
650.0000 mg | ORAL_TABLET | Freq: Four times a day (QID) | ORAL | Status: DC | PRN
  Administered 2015-06-03: 650 mg via ORAL
  Filled 2015-06-02: qty 2

## 2015-06-02 MED ORDER — VENLAFAXINE HCL ER 75 MG PO CP24
75.0000 mg | ORAL_CAPSULE | Freq: Every day | ORAL | Status: DC
  Administered 2015-06-03: 75 mg via ORAL
  Filled 2015-06-02: qty 1

## 2015-06-02 MED ORDER — METOCLOPRAMIDE HCL 5 MG PO TABS: 5.0000 mg | ORAL_TABLET | Freq: Three times a day (TID) | ORAL | Status: DC | PRN

## 2015-06-02 MED ORDER — EPHEDRINE SULFATE 50 MG/ML IJ SOLN
INTRAMUSCULAR | Status: AC
  Filled 2015-06-02: qty 1

## 2015-06-02 MED ORDER — ONDANSETRON HCL 4 MG PO TABS: 4.0000 mg | ORAL_TABLET | Freq: Four times a day (QID) | ORAL | Status: DC | PRN

## 2015-06-02 SURGICAL SUPPLY — 31 items
COVER PERINEAL POST (MISCELLANEOUS) ×3 IMPLANT
COVER SURGICAL LIGHT HANDLE (MISCELLANEOUS) ×6 IMPLANT
DRAPE STERI IOBAN 125X83 (DRAPES) ×3 IMPLANT
DRSG ADAPTIC 3X8 NADH LF (GAUZE/BANDAGES/DRESSINGS) ×3 IMPLANT
DRSG MEPILEX BORDER 4X4 (GAUZE/BANDAGES/DRESSINGS) ×3 IMPLANT
DURAPREP 26ML APPLICATOR (WOUND CARE) ×3 IMPLANT
ELECT REM PT RETURN 9FT ADLT (ELECTROSURGICAL) ×3
ELECTRODE REM PT RTRN 9FT ADLT (ELECTROSURGICAL) ×1 IMPLANT
GLOVE BIOGEL PI IND STRL 9 (GLOVE) ×1 IMPLANT
GLOVE BIOGEL PI INDICATOR 9 (GLOVE) ×2
GLOVE SURG ORTHO 9.0 STRL STRW (GLOVE) ×3 IMPLANT
GLOVE SURG SS PI 8.0 STRL IVOR (GLOVE) ×3 IMPLANT
GOWN STRL REUS W/ TWL XL LVL3 (GOWN DISPOSABLE) ×3 IMPLANT
GOWN STRL REUS W/TWL XL LVL3 (GOWN DISPOSABLE) ×9
GUIDEWIRE THREADED 2.8 (WIRE) ×9 IMPLANT
KIT BASIN OR (CUSTOM PROCEDURE TRAY) ×3 IMPLANT
KIT ROOM TURNOVER OR (KITS) ×3 IMPLANT
LINER BOOT UNIVERSAL DISP (MISCELLANEOUS) ×3 IMPLANT
MANIFOLD NEPTUNE II (INSTRUMENTS) ×3 IMPLANT
NS IRRIG 1000ML POUR BTL (IV SOLUTION) ×3 IMPLANT
PACK GENERAL/GYN (CUSTOM PROCEDURE TRAY) ×3 IMPLANT
PAD ARMBOARD 7.5X6 YLW CONV (MISCELLANEOUS) ×6 IMPLANT
SCREW CANN 16 THRD/100 7.3 (Screw) ×3 IMPLANT
SCREW CANN 16 THRD/105 7.3 (Screw) ×3 IMPLANT
SCREW CANN 16 THRD/110 7.3 (Screw) ×3 IMPLANT
SCREW CANN 16 THRD/115 7.3 (Screw) ×3 IMPLANT
STAPLER VISISTAT 35W (STAPLE) ×3 IMPLANT
SUT VIC AB 2-0 CTB1 (SUTURE) ×3 IMPLANT
TOWEL OR 17X24 6PK STRL BLUE (TOWEL DISPOSABLE) ×3 IMPLANT
TOWEL OR 17X26 10 PK STRL BLUE (TOWEL DISPOSABLE) ×3 IMPLANT
WATER STERILE IRR 1000ML POUR (IV SOLUTION) ×3 IMPLANT

## 2015-06-02 NOTE — Transfer of Care (Signed)
Immediate Anesthesia Transfer of Care Note  Patient: [name]  Procedure(s) Performed: Procedure(s): Cannulated Screws Left Hip (Left)  Patient Location: PACU  Anesthesia Type:General  Level of Consciousness: awake, alert , patient cooperative and responds to stimulation  Airway & Oxygen Therapy: Patient Spontanous Breathing and Patient connected to face mask oxygen  Post-op Assessment: Report given to RN and Post -op Vital signs reviewed and stable  Post vital signs: Reviewed and stable  Last Vitals:  Filed Vitals:   06/02/15 1559 06/02/15 2118  BP: 128/51 146/75  Pulse: 57 65  Temp: 36.5 C 36.2 C  Resp: 20 16    Complications: No apparent anesthesia complications
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
## 2015-06-02 NOTE — Progress Notes (Signed)
Pt needed pain medication ordered. Dr. Ninfa Linden notified. Orders given for oxycodone and dilaudid. Contraindication warning appeared on chart because of pt allergy to codeine. Discussed with pharmacist. She checked pt's previous admissions. Dilaudid has been given but not oxycodone. Oxycodone removed from orders. Dr. Aletha Halim be notified in a.m.

## 2015-06-02 NOTE — Anesthesia Preprocedure Evaluation (Signed)
Anesthesia Evaluation  Patient identified by MRN, date of birth, ID band Patient awake    Reviewed: Allergy & Precautions, NPO status , Patient's Chart, lab work & pertinent test results  History of Anesthesia Complications Negative for: history of anesthetic complications  Airway Mallampati: II  TM Distance: >3 FB Neck ROM: Full    Dental  (+) Partial Lower, Partial Upper   Pulmonary neg shortness of breath, neg sleep apnea, neg COPD, neg recent URI,    breath sounds clear to auscultation       Cardiovascular hypertension, Pt. on medications + Peripheral Vascular Disease and +CHF   Rhythm:Regular     Neuro/Psych Seizures -,  PSYCHIATRIC DISORDERS  Neuromuscular disease CVA    GI/Hepatic negative GI ROS, Neg liver ROS,   Endo/Other  diabetes, Type 2, Insulin Dependent  Renal/GU Renal InsufficiencyRenal disease     Musculoskeletal  (+) Arthritis ,   Abdominal   Peds  Hematology  (+) anemia ,   Anesthesia Other Findings   Reproductive/Obstetrics                             Anesthesia Physical Anesthesia Plan  ASA: III  Anesthesia Plan: Spinal and MAC   Post-op Pain Management:    Induction: Intravenous  Airway Management Planned: LMA  Additional Equipment: None  Intra-op Plan:   Post-operative Plan:   Informed Consent: I have reviewed the patients History and Physical, chart, labs and discussed the procedure including the risks, benefits and alternatives for the proposed anesthesia with the patient or authorized representative who has indicated his/her understanding and acceptance.   Dental advisory given  Plan Discussed with: CRNA and Surgeon  Anesthesia Plan Comments:         Anesthesia Quick Evaluation

## 2015-06-02 NOTE — Progress Notes (Signed)
Orthopedic Tech Progress Note Patient Details:  [name] 10/22/1945 [id number]  Ortho Devices Ortho Device/Splint Location: applied ohf to bed Ortho Device/Splint Interventions: Ordered, Application   Braulio Bosch 06/02/2015, 10:56 PM

## 2015-06-02 NOTE — Anesthesia Procedure Notes (Signed)
Spinal Patient location during procedure: OR Staffing Anesthesiologist: Fe Okubo Preanesthetic Checklist Completed: patient identified, surgical consent, pre-op evaluation, timeout performed, IV checked, risks and benefits discussed and monitors and equipment checked Spinal Block Patient position: left lateral decubitus Prep: site prepped and draped and DuraPrep Patient monitoring: heart rate, cardiac monitor, continuous pulse ox and blood pressure Approach: midline Location: L4-5 Injection technique: single-shot Needle Needle type: Pencan  Needle gauge: 24 G Needle length: 10 cm Assessment Sensory level: T6

## 2015-06-02 NOTE — Op Note (Signed)
06/02/2015  9:20 PM  PATIENT:  [name]    PRE-OPERATIVE DIAGNOSIS:  Left Hip Femoral Neck Fracture  POST-OPERATIVE DIAGNOSIS:  Same  PROCEDURE:  Cannulated Screws Left Hip  SURGEON:  Newt Minion, MD  PHYSICIAN ASSISTANT:None ANESTHESIA:   General  PREOPERATIVE INDICATIONS:  [name] is a  70 y.o. male with a diagnosis of Left Hip Femoral Neck Fracture who failed conservative measures and elected for surgical management.    The risks benefits and alternatives were discussed with the patient preoperatively including but not limited to the risks of infection, bleeding, nerve injury, cardiopulmonary complications, the need for revision surgery, among others, and the patient was willing to proceed.  OPERATIVE IMPLANTS: 7.3 cannulated screws 3  OPERATIVE FINDINGS: Nondisplaced femoral neck fracture on the left with a left transtibial amputation  OPERATIVE PROCEDURE: Patient was brought to the operating room and underwent a spinal anesthetic. After adequate levels anesthesia obtained patient was placed supine on the [facility] fracture table the transtibial amputation was placed in the boot and secured with Coban and the right lower extremity was lowered and placed in a foot support patient was prepped using DuraPrep draped in a sterile field a shower curtain. A timeout was called. An incision was made laterally of the femur. Using the multi-direction guide 3 K wires were inserted into the femoral head in a triangular fashion. C-arm floss be verified alignment in both AP and lateral planes. Cannulated screws were then inserted over the wire. The wires were removed the screws were tightened radiographs were obtained, patient had stable alignment of the femoral neck fracture. The wound was irrigated with normal saline subcutaneous is closed using 2-0 Vicryl skin was closed using staples and Mepilex dressing was applied patient was taken to the PACU in stable condition.

## 2015-06-03 ENCOUNTER — Encounter (HOSPITAL_COMMUNITY): Payer: Self-pay | Admitting: Orthopedic Surgery

## 2015-06-03 DIAGNOSIS — S72002A Fracture of unspecified part of neck of left femur, initial encounter for closed fracture: Secondary | ICD-10-CM | POA: Diagnosis not present

## 2015-06-03 LAB — GLUCOSE, CAPILLARY: Glucose-Capillary: 208 mg/dL — ABNORMAL HIGH (ref 65–99)

## 2015-06-03 MED ORDER — HYDROCODONE-ACETAMINOPHEN 5-325 MG PO TABS: 1.0000 | ORAL_TABLET | Freq: Four times a day (QID) | ORAL | Status: DC | PRN

## 2015-06-03 NOTE — Discharge Summary (Signed)
Physician Discharge Summary  Patient ID: RUHAAN TAUBMAN MRN: [id number] DOB/AGE: 23-Jun-1945 70 y.o.  Admit date: 06/02/2015 Discharge date: 06/03/2015  Admission Diagnoses:Left femoral neck fracture  Discharge Diagnoses:  Active Problems:   Femoral neck fracture, left, closed, initial encounter   Discharged Condition: stable  Hospital Course: Patient's hospital course was essentially unremarkable. He underwent cannulated screw fixation of his left hip. Postoperatively patient was more comfortable and was discharged to home in stable condition.  Consults: None  Significant Diagnostic Studies: labs: Routine labs  Treatments: surgery: See operative note  Discharge Exam: Blood pressure 129/55, pulse 67, temperature 98.8 F (37.1 C), temperature source Oral, resp. rate 16, height 5\' 10"  (1.778 m), weight 69.4 kg (153 lb), SpO2 100 %. Incision/Wound: Dressing clean and dry  Disposition: 03-Skilled Nursing Facility  Discharge Instructions    Call MD / Call 911    Complete by:  As directed   If you experience chest pain or shortness of breath, CALL 911 and be transported to the hospital emergency room.  If you develope a fever above 101 F, pus (white drainage) or increased drainage or redness at the wound, or calf pain, call your surgeon's office.     Change dressing    Complete by:  As directed   Apply a new Mepilex dressing prior to discharge     Constipation Prevention    Complete by:  As directed   Drink plenty of fluids.  Prune juice may be helpful.  You may use a stool softener, such as Colace (over the counter) 100 mg twice a day.  Use MiraLax (over the counter) for constipation as needed.     Diet - low sodium heart healthy    Complete by:  As directed      Increase activity slowly as tolerated    Complete by:  As directed      Non weight bearing    Complete by:  As directed   Laterality:  left  Extremity:  Lower            Medication List    TAKE these medications         aspirin 325 MG tablet  Take 325 mg by mouth daily.     atorvastatin 20 MG tablet  Commonly known as:  LIPITOR  Take 20 mg by mouth daily.     carvedilol 6.25 MG tablet  Commonly known as:  COREG  Take 1 tablet (6.25 mg total) by mouth 2 (two) times daily with a meal.     CENTRUM SILVER ADULT 50+ PO  Take 1 tablet by mouth daily.     hydrochlorothiazide 50 MG tablet  Commonly known as:  HYDRODIURIL  Take 50 mg by mouth daily.     HYDROcodone-acetaminophen 5-325 MG tablet  Commonly known as:  NORCO  Take 1 tablet by mouth every 6 (six) hours as needed.     insulin detemir 100 UNIT/ML injection  Commonly known as:  LEVEMIR  Inject 14 Units into the skin at bedtime.     levETIRAcetam 500 MG tablet  Commonly known as:  KEPPRA  Take 1 tablet (500 mg total) by mouth 2 (two) times daily.     memantine 10 MG tablet  Commonly known as:  NAMENDA  Take 1 tablet (10 mg total) by mouth 2 (two) times daily.     SENOKOT TO GO 8.6 MG tablet  Generic drug:  senna  Take 1 tablet by mouth every evening.  tamsulosin 0.4 MG Caps capsule  Commonly known as:  FLOMAX  Take 1 capsule (0.4 mg total) by mouth daily after supper.     timolol 0.5 % ophthalmic solution  Commonly known as:  BETIMOL  Place 1 drop into both eyes 2 (two) times daily.     Travoprost (BAK Free) 0.004 % Soln ophthalmic solution  Commonly known as:  [name]  Place 1 drop into both eyes at bedtime.     venlafaxine XR 75 MG 24 hr capsule  Commonly known as:  EFFEXOR-XR  Take 75 mg by mouth daily.     vitamin C 500 MG tablet  Commonly known as:  ASCORBIC ACID  Take 500 mg by mouth daily.           Follow-up Information    Follow up with DUDA,MARCUS V, MD In 2 weeks.   Specialty:  Orthopedic Surgery   Contact information:   [place] [address] [phone]       Signed: Newt Minion 06/03/2015, 7:37 AM

## 2015-06-03 NOTE — Evaluation (Signed)
Physical Therapy Evaluation Patient Details Name: [name] MRN: [id number] DOB: [birth year] Today's Date: 06/03/2015   History of Present Illness  70 year old gentleman status post a left transtibial amputation who fell sustaining a nondisplaced left femoral neck fracture, now s/p implantation of cannulated screws  Clinical Impression  Patient seen for OOB mobility in preparation for d/c home. Patient mobilizing well. Ambulating in room with RW. Needed assist for bed mobility but reports family will assist with this. States he can use w/c for increased mobility. Advised patient not to use prosthesis secondary to [place] at this time. Anticipate patient will be safe for d/c home with family assist. Patient may need PT follow up once cleared for WBing to re acclimate with use of prosthesis.  No furter acute PT needs, will sign off.    Follow Up Recommendations Supervision for mobility/OOB    Equipment Recommendations  None recommended by PT    Recommendations for Other Services       Precautions / Restrictions Precautions Precautions: Fall Restrictions Weight Bearing Restrictions: Yes LLE Weight Bearing: Non weight bearing      Mobility  Bed Mobility Overal bed mobility: Needs Assistance Bed Mobility: Supine to Sit     Supine to sit: Min assist     General bed mobility comments: significantly increased time to perform, physical assist to elevate trunk to upright and scoot hips to EOB  Transfers Overall transfer level: Needs assistance Equipment used: Rolling walker (2 wheeled) Transfers: Sit to/from Stand Sit to Stand: Min guard         General transfer comment: Min guard for safety, VCs for hand placement, no physical assist required  Ambulation/Gait Ambulation/Gait assistance: Supervision Ambulation Distance (Feet): 16 Feet Assistive device: Rolling walker (2 wheeled) Gait Pattern/deviations: Step-to pattern     General Gait Details: no difficulty with in  room ambulation  Stairs            Wheelchair Mobility    Modified Rankin (Stroke Patients Only)       Balance Overall balance assessment: History of Falls                                           Pertinent Vitals/Pain Pain Assessment: Faces Faces Pain Scale: Hurts even more Pain Location: left hip during movement  Pain Descriptors / Indicators: Aching;Grimacing;Guarding Pain Intervention(s): Monitored during session;Limited activity within patient's tolerance;Repositioned    Home Living Family/patient expects to be discharged to:: Private residence Living Arrangements: Spouse/significant other Available Help at Discharge: Family;Available 24 hours/day Type of Home: House Home Access: Ramped entrance     Home Layout: One level Home Equipment: Bedside commode;Shower seat      Prior Function Level of Independence: Needs assistance   Gait / Transfers Assistance Needed: used prosthesis prior to admission, able to use RW and w/c   ADL's / Homemaking Assistance Needed: wife provides some assist        Hand Dominance   Dominant Hand: Right    Extremity/Trunk Assessment   Upper Extremity Assessment: Overall WFL for tasks assessed           Lower Extremity Assessment: RLE deficits/detail         Communication   Communication: No difficulties  Cognition Arousal/Alertness: Awake/alert Behavior During Therapy: WFL for tasks assessed/performed Overall Cognitive Status: Within Functional Limits for tasks assessed  General Comments      Exercises        Assessment/Plan    PT Assessment Patent does not need any further PT services  PT Diagnosis Difficulty walking;Acute pain   PT Problem List    PT Treatment Interventions     PT Goals (Current goals can be found in the Care Plan section) Acute Rehab PT Goals Patient Stated Goal: to go home PT Goal Formulation: All assessment and education  complete, DC therapy    Frequency     Barriers to discharge        Co-evaluation               End of Session Equipment Utilized During Treatment: Gait belt Activity Tolerance: Patient tolerated treatment well Patient left: in chair;with call bell/phone within reach Nurse Communication: Mobility status         Time: [id number] PT Time Calculation (min) (ACUTE ONLY): 21 min   Charges:   PT Evaluation $PT Eval Moderate Complexity: 1 Procedure     PT G CodesDuncan Dull 06-20-2015, 9:16 AM Alben Deeds, PT DPT  [phone]

## 2015-06-03 NOTE — Care Management Obs Status (Signed)
[place] NOTIFICATION   Patient Details  Name: GRABIEL FINUCAN MRN: [id number] Date of Birth: 01-12-1946   Medicare Observation Status Notification Given:  Yes Your procedure is listed as outpatient, you are listed as observation under medicare guidelines.   Ninfa Meeker, RN 06/03/2015, 11:08 AM

## 2015-06-03 NOTE — Progress Notes (Signed)
Pt discharge education and instructions completed with pt and spouse at bedside; both voices understanding and denied any questions. Pt IV removed; incision dsg changed, clean, dry and intact. Pt handed his prescription for Norco. Pt discharge home with spouse to transport him home. Pt transported off unit via wheelchair with spouse and belongings to the side. PDarden Palmer [place] Rn

## 2015-06-03 NOTE — Discharge Instructions (Signed)
INSTRUCTIONS AFTER JOINT REPLACEMENT   o Remove items at home which could result in a fall. This includes throw rugs or furniture in walking pathways o ICE to the affected joint every three hours while awake for 30 minutes at a time, for at least the first 3-5 days, and then as needed for pain and swelling.  Continue to use ice for pain and swelling. You may notice swelling that will progress down to the foot and ankle.  This is normal after surgery.  Elevate your leg when you are not up walking on it.   o Continue to use the breathing machine you got in the hospital (incentive spirometer) which will help keep your temperature down.  It is common for your temperature to cycle up and down following surgery, especially at night when you are not up moving around and exerting yourself.  The breathing machine keeps your lungs expanded and your temperature down.   DIET:  As you were doing prior to hospitalization, we recommend a well-balanced diet.  DRESSING / WOUND CARE / SHOWERING  You may change your dressing 3-5 days after surgery.  Then change the dressing every day with sterile gauze.  Please use good hand washing techniques before changing the dressing.  Do not use any lotions or creams on the incision until instructed by your surgeon.  ACTIVITY  o Increase activity slowly as tolerated, but follow the weight bearing instructions below.   o No driving for 6 weeks or until further direction given by your physician.  You cannot drive while taking narcotics.  o No lifting or carrying greater than 10 lbs. until further directed by your surgeon. o Avoid periods of inactivity such as sitting longer than an hour when not asleep. This helps prevent blood clots.  o You may return to work once you are authorized by your doctor.     WEIGHT BEARING   Other:  Nonweightbearing left   EXERCISES  Results after joint replacement surgery are often greatly improved when you follow the exercise, range of  motion and muscle strengthening exercises prescribed by your doctor. Safety measures are also important to protect the joint from further injury. Any time any of these exercises cause you to have increased pain or swelling, decrease what you are doing until you are comfortable again and then slowly increase them. If you have problems or questions, call your caregiver or physical therapist for advice.   Rehabilitation is important following a joint replacement. After just a few days of immobilization, the muscles of the leg can become weakened and shrink (atrophy).  These exercises are designed to build up the tone and strength of the thigh and leg muscles and to improve motion. Often times heat used for twenty to thirty minutes before working out will loosen up your tissues and help with improving the range of motion but do not use heat for the first two weeks following surgery (sometimes heat can increase post-operative swelling).   These exercises can be done on a training (exercise) mat, on the floor, on a table or on a bed. Use whatever works the best and is most comfortable for you.    Use music or television while you are exercising so that the exercises are a pleasant break in your day. This will make your life better with the exercises acting as a break in your routine that you can look forward to.   Perform all exercises about fifteen times, three times per day or as directed.  You should exercise both the operative leg and the other leg as well.  Exercises include:    Quad Sets - Tighten up the muscle on the front of the thigh (Quad) and hold for 5-10 seconds.    Straight Leg Raises - With your knee straight (if you were given a brace, keep it on), lift the leg to 60 degrees, hold for 3 seconds, and slowly lower the leg.  Perform this exercise against resistance later as your leg gets stronger.   Leg Slides: Lying on your back, slowly slide your foot toward your buttocks, bending your knee up  off the floor (only go as far as is comfortable). Then slowly slide your foot back down until your leg is flat on the floor again.   Angel Wings: Lying on your back spread your legs to the side as far apart as you can without causing discomfort.   Hamstring Strength:  Lying on your back, push your heel against the floor with your leg straight by tightening up the muscles of your buttocks.  Repeat, but this time bend your knee to a comfortable angle, and push your heel against the floor.  You may put a pillow under the heel to make it more comfortable if necessary.   A rehabilitation program following joint replacement surgery can speed recovery and prevent re-injury in the future due to weakened muscles. Contact your doctor or a physical therapist for more information on knee rehabilitation.    CONSTIPATION  Constipation is defined medically as fewer than three stools per week and severe constipation as less than one stool per week.  Even if you have a regular bowel pattern at home, your normal regimen is likely to be disrupted due to multiple reasons following surgery.  Combination of anesthesia, postoperative narcotics, change in appetite and fluid intake all can affect your bowels.   YOU MUST use at least one of the following options; they are listed in order of increasing strength to get the job done.  They are all available over the counter, and you may need to use some, POSSIBLY even all of these options:    Drink plenty of fluids (prune juice may be helpful) and high fiber foods Colace 100 mg by mouth twice a day  Senokot for constipation as directed and as needed Dulcolax (bisacodyl), take with full glass of water  Miralax (polyethylene glycol) once or twice a day as needed.  If you have tried all these things and are unable to have a bowel movement in the first 3-4 days after surgery call either your surgeon or your primary doctor.    If you experience loose stools or diarrhea, hold  the medications until you stool forms back up.  If your symptoms do not get better within 1 week or if they get worse, check with your doctor.  If you experience "the worst abdominal pain ever" or develop nausea or vomiting, please contact the office immediately for further recommendations for treatment.   ITCHING:  If you experience itching with your medications, try taking only a single pain pill, or even half a pain pill at a time.  You can also use Benadryl over the counter for itching or also to help with sleep.   TED HOSE STOCKINGS:  Use stockings on both legs until for at least 2 weeks or as directed by physician office. They may be removed at night for sleeping.  MEDICATIONS:  See your medication summary on the After Visit Summary that  nursing will review with you.  You may have some home medications which will be placed on hold until you complete the course of blood thinner medication.  It is important for you to complete the blood thinner medication as prescribed.  PRECAUTIONS:  If you experience chest pain or shortness of breath - call 911 immediately for transfer to the hospital emergency department.   If you develop a fever greater that 101 F, purulent drainage from wound, increased redness or drainage from wound, foul odor from the wound/dressing, or calf pain - CONTACT YOUR SURGEON.                                                   FOLLOW-UP APPOINTMENTS:  If you do not already have a post-op appointment, please call the office for an appointment to be seen by your surgeon.  Guidelines for how soon to be seen are listed in your After Visit Summary, but are typically between 1-4 weeks after surgery.  OTHER INSTRUCTIONS:   Knee Replacement:  Do not place pillow under knee, focus on keeping the knee straight while resting. CPM instructions: 0-90 degrees, 2 hours in the morning, 2 hours in the afternoon, and 2 hours in the evening. Place foam block, curve side up under heel at all  times except when in CPM or when walking.  DO NOT modify, tear, cut, or change the foam block in any way.  MAKE SURE YOU:   Understand these instructions.   Get help right away if you are not doing well or get worse.    Thank you for letting us be a part of your medical care team.  It is a privilege we respect greatly.  We hope these instructions will help you stay on track for a fast and full recovery!

## 2015-06-04 ENCOUNTER — Encounter (HOSPITAL_COMMUNITY): Payer: Self-pay | Admitting: Orthopedic Surgery

## 2015-06-04 NOTE — Anesthesia Postprocedure Evaluation (Signed)
Anesthesia Post Note  Patient: [name]  Procedure(s) Performed: Procedure(s) (LRB): Cannulated Screws Left Hip (Left)  Patient location during evaluation: PACU Anesthesia Type: Spinal Level of consciousness: awake Pain management: pain level controlled Vital Signs Assessment: post-procedure vital signs reviewed and stable Respiratory status: spontaneous breathing and respiratory function stable Cardiovascular status: stable Postop Assessment: spinal receding, patient able to bend at knees and no signs of nausea or vomiting Anesthetic complications: no    Last Vitals:  Filed Vitals:   06/03/15 0200 06/03/15 0500  BP: 118/42 129/55  Pulse: 71 67  Temp: 36.7 C 37.1 C  Resp: 16 16    Last Pain:  Filed Vitals:   06/03/15 1102  PainSc: Asleep                 [name]

## 2015-06-11 DIAGNOSIS — R350 Frequency of micturition: Secondary | ICD-10-CM | POA: Diagnosis not present

## 2015-06-16 DIAGNOSIS — S72042D Displaced fracture of base of neck of left femur, subsequent encounter for closed fracture with routine healing: Secondary | ICD-10-CM | POA: Diagnosis not present

## 2015-06-19 DIAGNOSIS — R55 Syncope and collapse: Secondary | ICD-10-CM

## 2015-06-19 HISTORY — DX: Syncope and collapse: R55

## 2015-06-21 DIAGNOSIS — I10 Essential (primary) hypertension: Secondary | ICD-10-CM | POA: Diagnosis not present

## 2015-06-21 DIAGNOSIS — E118 Type 2 diabetes mellitus with unspecified complications: Secondary | ICD-10-CM | POA: Diagnosis not present

## 2015-06-21 DIAGNOSIS — E875 Hyperkalemia: Secondary | ICD-10-CM | POA: Diagnosis not present

## 2015-06-28 DIAGNOSIS — G629 Polyneuropathy, unspecified: Secondary | ICD-10-CM | POA: Diagnosis not present

## 2015-06-28 DIAGNOSIS — N390 Urinary tract infection, site not specified: Secondary | ICD-10-CM | POA: Diagnosis not present

## 2015-06-28 DIAGNOSIS — S72009A Fracture of unspecified part of neck of unspecified femur, initial encounter for closed fracture: Secondary | ICD-10-CM | POA: Diagnosis not present

## 2015-06-28 DIAGNOSIS — E118 Type 2 diabetes mellitus with unspecified complications: Secondary | ICD-10-CM | POA: Diagnosis not present

## 2015-07-02 ENCOUNTER — Observation Stay (HOSPITAL_COMMUNITY): Payer: Medicare Other

## 2015-07-02 ENCOUNTER — Observation Stay (HOSPITAL_COMMUNITY)
Admission: EM | Admit: 2015-07-02 | Discharge: 2015-07-03 | Disposition: A | Discharge status: Home Health | Payer: Medicare Other | Attending: Internal Medicine | Admitting: Internal Medicine

## 2015-07-02 ENCOUNTER — Encounter (HOSPITAL_COMMUNITY): Payer: Self-pay | Admitting: Emergency Medicine

## 2015-07-02 ENCOUNTER — Emergency Department (HOSPITAL_COMMUNITY): Payer: Medicare Other

## 2015-07-02 DIAGNOSIS — Z880 Allergy status to penicillin: Secondary | ICD-10-CM | POA: Diagnosis not present

## 2015-07-02 DIAGNOSIS — Z7982 Long term (current) use of aspirin: Secondary | ICD-10-CM | POA: Insufficient documentation

## 2015-07-02 DIAGNOSIS — R262 Difficulty in walking, not elsewhere classified: Secondary | ICD-10-CM | POA: Insufficient documentation

## 2015-07-02 DIAGNOSIS — Z794 Long term (current) use of insulin: Secondary | ICD-10-CM | POA: Diagnosis not present

## 2015-07-02 DIAGNOSIS — R404 Transient alteration of awareness: Secondary | ICD-10-CM | POA: Diagnosis not present

## 2015-07-02 DIAGNOSIS — Z8673 Personal history of transient ischemic attack (TIA), and cerebral infarction without residual deficits: Secondary | ICD-10-CM | POA: Diagnosis not present

## 2015-07-02 DIAGNOSIS — I5032 Chronic diastolic (congestive) heart failure: Secondary | ICD-10-CM | POA: Diagnosis present

## 2015-07-02 DIAGNOSIS — E119 Type 2 diabetes mellitus without complications: Secondary | ICD-10-CM | POA: Diagnosis not present

## 2015-07-02 DIAGNOSIS — N184 Chronic kidney disease, stage 4 (severe): Secondary | ICD-10-CM | POA: Diagnosis present

## 2015-07-02 DIAGNOSIS — G40909 Epilepsy, unspecified, not intractable, without status epilepticus: Secondary | ICD-10-CM | POA: Diagnosis not present

## 2015-07-02 DIAGNOSIS — D631 Anemia in chronic kidney disease: Secondary | ICD-10-CM | POA: Diagnosis not present

## 2015-07-02 DIAGNOSIS — I10 Essential (primary) hypertension: Secondary | ICD-10-CM | POA: Diagnosis not present

## 2015-07-02 DIAGNOSIS — I130 Hypertensive heart and chronic kidney disease with heart failure and stage 1 through stage 4 chronic kidney disease, or unspecified chronic kidney disease: Secondary | ICD-10-CM | POA: Diagnosis not present

## 2015-07-02 DIAGNOSIS — R55 Syncope and collapse: Principal | ICD-10-CM | POA: Diagnosis present

## 2015-07-02 DIAGNOSIS — N183 Chronic kidney disease, stage 3 (moderate): Secondary | ICD-10-CM | POA: Diagnosis not present

## 2015-07-02 DIAGNOSIS — R269 Unspecified abnormalities of gait and mobility: Secondary | ICD-10-CM | POA: Diagnosis not present

## 2015-07-02 DIAGNOSIS — Z89511 Acquired absence of right leg below knee: Secondary | ICD-10-CM | POA: Insufficient documentation

## 2015-07-02 DIAGNOSIS — D649 Anemia, unspecified: Secondary | ICD-10-CM | POA: Diagnosis present

## 2015-07-02 HISTORY — DX: Syncope and collapse: R55

## 2015-07-02 LAB — GLUCOSE, CAPILLARY
GLUCOSE-CAPILLARY: 134 mg/dL — AB (ref 65–99)
GLUCOSE-CAPILLARY: 191 mg/dL — AB (ref 65–99)

## 2015-07-02 LAB — CREATININE, SERUM
Creatinine, Ser: 2.31 mg/dL — ABNORMAL HIGH (ref 0.61–1.24)
GFR, EST AFRICAN AMERICAN: 32 mL/min — AB (ref >60)
GFR, EST NON AFRICAN AMERICAN: 27 mL/min — AB (ref >60)

## 2015-07-02 LAB — URINALYSIS, ROUTINE W REFLEX MICROSCOPIC
Bilirubin Urine: NEGATIVE
Glucose, UA: NEGATIVE mg/dL
HGB URINE DIPSTICK: NEGATIVE
Ketones, ur: NEGATIVE mg/dL
LEUKOCYTES UA: NEGATIVE
Nitrite: NEGATIVE
PROTEIN: 30 mg/dL — AB
SPECIFIC GRAVITY, URINE: 1.012 (ref 1.005–1.030)
pH: 5.5 (ref 5.0–8.0)

## 2015-07-02 LAB — BASIC METABOLIC PANEL
ANION GAP: 9 (ref 5–15)
BUN: 51 mg/dL — ABNORMAL HIGH (ref 6–20)
CALCIUM: 9.3 mg/dL (ref 8.9–10.3)
CO2: 23 mmol/L (ref 22–32)
Chloride: 107 mmol/L (ref 101–111)
Creatinine, Ser: 2.18 mg/dL — ABNORMAL HIGH (ref 0.61–1.24)
GFR, EST AFRICAN AMERICAN: 34 mL/min — AB (ref >60)
GFR, EST NON AFRICAN AMERICAN: 29 mL/min — AB (ref >60)
GLUCOSE: 135 mg/dL — AB (ref 65–99)
POTASSIUM: 4.2 mmol/L (ref 3.5–5.1)
SODIUM: 139 mmol/L (ref 135–145)

## 2015-07-02 LAB — CBC
HEMATOCRIT: 31.7 % — AB (ref 39.0–52.0)
HEMOGLOBIN: 10.5 g/dL — AB (ref 13.0–17.0)
MCH: 29 pg (ref 26.0–34.0)
MCHC: 33.1 g/dL (ref 30.0–36.0)
MCV: 87.6 fL (ref 78.0–100.0)
PLATELETS: 202 10*3/uL (ref 150–400)
RBC: 3.62 MIL/uL — ABNORMAL LOW (ref 4.22–5.81)
RDW: 12.6 % (ref 11.5–15.5)
WBC: 7.3 10*3/uL (ref 4.0–10.5)

## 2015-07-02 LAB — CBG MONITORING, ED: GLUCOSE-CAPILLARY: 110 mg/dL — AB (ref 65–99)

## 2015-07-02 LAB — URINE MICROSCOPIC-ADD ON

## 2015-07-02 LAB — I-STAT TROPONIN, ED: Troponin i, poc: 0 ng/mL (ref 0.00–0.08)

## 2015-07-02 LAB — TSH: TSH: 0.632 u[IU]/mL (ref 0.350–4.500)

## 2015-07-02 LAB — TROPONIN I: Troponin I: <0.03 ng/mL (ref <0.031)

## 2015-07-02 MED ORDER — VENLAFAXINE HCL ER 75 MG PO CP24: 75.0000 mg | ORAL_CAPSULE | Freq: Every day | ORAL | Status: DC

## 2015-07-02 MED ORDER — INSULIN ASPART 100 UNIT/ML ~~LOC~~ SOLN: 0.0000 [IU] | Freq: Three times a day (TID) | SUBCUTANEOUS | Status: DC

## 2015-07-02 MED ORDER — SODIUM CHLORIDE 0.9% FLUSH
3.0000 mL | Freq: Two times a day (BID) | INTRAVENOUS | Status: DC
  Administered 2015-07-02: 3 mL via INTRAVENOUS

## 2015-07-02 MED ORDER — LEVETIRACETAM 500 MG PO TABS
500.0000 mg | ORAL_TABLET | Freq: Two times a day (BID) | ORAL | Status: DC
  Administered 2015-07-02 – 2015-07-03 (×3): 500 mg via ORAL
  Filled 2015-07-02 (×2): qty 1

## 2015-07-02 MED ORDER — SODIUM CHLORIDE 0.9 % IV SOLN
INTRAVENOUS | Status: DC
  Administered 2015-07-02: 11:00:00 via INTRAVENOUS

## 2015-07-02 MED ORDER — HEPARIN SODIUM (PORCINE) 5000 UNIT/ML IJ SOLN
5000.0000 [IU] | Freq: Three times a day (TID) | INTRAMUSCULAR | Status: DC
  Administered 2015-07-02 – 2015-07-03 (×3): 5000 [IU] via SUBCUTANEOUS
  Filled 2015-07-02 (×3): qty 1

## 2015-07-02 MED ORDER — SODIUM CHLORIDE 0.9 % IV BOLUS (SEPSIS)
500.0000 mL | Freq: Once | INTRAVENOUS | Status: AC
  Administered 2015-07-02: 500 mL via INTRAVENOUS

## 2015-07-02 MED ORDER — SENNOSIDES 8.6 MG PO TABS: 1.0000 | ORAL_TABLET | Freq: Every evening | ORAL | Status: DC

## 2015-07-02 MED ORDER — LATANOPROST 0.005 % OP SOLN
1.0000 [drp] | Freq: Every day | OPHTHALMIC | Status: DC
  Administered 2015-07-02: 1 [drp] via OPHTHALMIC
  Filled 2015-07-02: qty 2.5

## 2015-07-02 MED ORDER — FLEET ENEMA 7-19 GM/118ML RE ENEM: 1.0000 | ENEMA | Freq: Once | RECTAL | Status: DC | PRN

## 2015-07-02 MED ORDER — CARVEDILOL 6.25 MG PO TABS
6.2500 mg | ORAL_TABLET | Freq: Two times a day (BID) | ORAL | Status: DC
  Administered 2015-07-02 – 2015-07-03 (×2): 6.25 mg via ORAL
  Filled 2015-07-02 (×2): qty 1

## 2015-07-02 MED ORDER — INSULIN GLARGINE 100 UNIT/ML ~~LOC~~ SOLN: 10.0000 [IU] | Freq: Every day | SUBCUTANEOUS | Status: DC

## 2015-07-02 MED ORDER — INSULIN DETEMIR 100 UNIT/ML ~~LOC~~ SOLN
10.0000 [IU] | Freq: Every day | SUBCUTANEOUS | Status: DC
  Administered 2015-07-02: 10 [IU] via SUBCUTANEOUS
  Filled 2015-07-02 (×2): qty 0.1

## 2015-07-02 MED ORDER — INSULIN ASPART 100 UNIT/ML ~~LOC~~ SOLN: 0.0000 [IU] | Freq: Every day | SUBCUTANEOUS | Status: DC

## 2015-07-02 MED ORDER — ALUM & MAG HYDROXIDE-SIMETH 200-200-20 MG/5ML PO SUSP: 30.0000 mL | Freq: Four times a day (QID) | ORAL | Status: DC | PRN

## 2015-07-02 MED ORDER — MEMANTINE HCL 10 MG PO TABS: 10.0000 mg | ORAL_TABLET | Freq: Two times a day (BID) | ORAL | Status: DC

## 2015-07-02 MED ORDER — TIMOLOL HEMIHYDRATE 0.5 % OP SOLN
1.0000 [drp] | Freq: Two times a day (BID) | OPHTHALMIC | Status: DC
  Filled 2015-07-02: qty 5

## 2015-07-02 MED ORDER — ACETAMINOPHEN 650 MG RE SUPP: 650.0000 mg | Freq: Four times a day (QID) | RECTAL | Status: DC | PRN

## 2015-07-02 MED ORDER — ACETAMINOPHEN 325 MG PO TABS: 650.0000 mg | ORAL_TABLET | Freq: Four times a day (QID) | ORAL | Status: DC | PRN

## 2015-07-02 MED ORDER — TIMOLOL HEMIHYDRATE 0.5 % OP SOLN: 1.0000 [drp] | Freq: Two times a day (BID) | OPHTHALMIC | Status: DC

## 2015-07-02 MED ORDER — INSULIN ASPART 100 UNIT/ML ~~LOC~~ SOLN
0.0000 [IU] | Freq: Three times a day (TID) | SUBCUTANEOUS | Status: DC
  Administered 2015-07-02: 2 [IU] via SUBCUTANEOUS

## 2015-07-02 MED ORDER — SENNA 8.6 MG PO TABS
1.0000 | ORAL_TABLET | Freq: Two times a day (BID) | ORAL | Status: DC
  Administered 2015-07-02 – 2015-07-03 (×2): 8.6 mg via ORAL
  Filled 2015-07-02 (×2): qty 1

## 2015-07-02 MED ORDER — ATORVASTATIN CALCIUM 20 MG PO TABS
20.0000 mg | ORAL_TABLET | Freq: Every day | ORAL | Status: DC
  Administered 2015-07-02 – 2015-07-03 (×2): 20 mg via ORAL
  Filled 2015-07-02 (×2): qty 1

## 2015-07-02 MED ORDER — VENLAFAXINE HCL ER 75 MG PO CP24
75.0000 mg | ORAL_CAPSULE | Freq: Every day | ORAL | Status: DC
  Administered 2015-07-02 – 2015-07-03 (×2): 75 mg via ORAL
  Filled 2015-07-02: qty 1

## 2015-07-02 MED ORDER — SODIUM CHLORIDE 0.9 % IV SOLN
INTRAVENOUS | Status: AC
  Administered 2015-07-02: 50 mL/h via INTRAVENOUS

## 2015-07-02 MED ORDER — ONDANSETRON HCL 4 MG/2ML IJ SOLN: 4.0000 mg | Freq: Four times a day (QID) | INTRAMUSCULAR | Status: DC | PRN

## 2015-07-02 MED ORDER — SENNOSIDES-DOCUSATE SODIUM 8.6-50 MG PO TABS: 1.0000 | ORAL_TABLET | Freq: Every evening | ORAL | Status: DC | PRN

## 2015-07-02 MED ORDER — MEMANTINE HCL 10 MG PO TABS
10.0000 mg | ORAL_TABLET | Freq: Two times a day (BID) | ORAL | Status: DC
  Administered 2015-07-02 – 2015-07-03 (×3): 10 mg via ORAL
  Filled 2015-07-02 (×2): qty 1

## 2015-07-02 MED ORDER — ATORVASTATIN CALCIUM 20 MG PO TABS: 20.0000 mg | ORAL_TABLET | Freq: Every day | ORAL | Status: DC

## 2015-07-02 MED ORDER — ONDANSETRON HCL 4 MG PO TABS: 4.0000 mg | ORAL_TABLET | Freq: Four times a day (QID) | ORAL | Status: DC | PRN

## 2015-07-02 MED ORDER — TIMOLOL MALEATE 0.5 % OP SOLN
1.0000 [drp] | Freq: Two times a day (BID) | OPHTHALMIC | Status: DC
  Administered 2015-07-02 – 2015-07-03 (×3): 1 [drp] via OPHTHALMIC
  Filled 2015-07-02: qty 5

## 2015-07-02 MED ORDER — ASPIRIN 325 MG PO TABS: 325.0000 mg | ORAL_TABLET | Freq: Every day | ORAL | Status: DC

## 2015-07-02 MED ORDER — SENNA 8.6 MG PO TABS
1.0000 | ORAL_TABLET | Freq: Two times a day (BID) | ORAL | Status: DC
  Administered 2015-07-02: 8.6 mg via ORAL
  Filled 2015-07-02: qty 1

## 2015-07-02 MED ORDER — LEVETIRACETAM 500 MG PO TABS: 500.0000 mg | ORAL_TABLET | Freq: Two times a day (BID) | ORAL | Status: DC

## 2015-07-02 MED ORDER — ASPIRIN 325 MG PO TABS
325.0000 mg | ORAL_TABLET | Freq: Every day | ORAL | Status: DC
  Administered 2015-07-02 – 2015-07-03 (×2): 325 mg via ORAL
  Filled 2015-07-02 (×2): qty 1

## 2015-07-02 NOTE — ED Notes (Signed)
Pt in EMS from home, had 2 syncopal episodes, one during BM and one after. Pt initially hypotensive with EMS (60 palpate) Last pressure 172/75. Pt sinus brady on monitor. A/OX4
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
## 2015-07-02 NOTE — ED Notes (Signed)
NP at bedside.

## 2015-07-02 NOTE — Evaluation (Signed)
Physical Therapy Evaluation & D/C Patient Details Name: [name] MRN: [id number] DOB: 1945-08-31 Today's Date: 07/02/2015   History of Present Illness   [name] is a 70 y.o. male with a Past Medical History of diabetes, hypertension, stroke, seizures, chronic kidney disease stage II, peripheral vascular disease status post right BKA in 2014, he sent femoral neck fracture status post surgical repair presents to the emergency department with the chief complaint of syncope. Likely vasovagal syncope   Clinical Impression  Pt was able to perform all functional mobility with supervision and use of RW. Pt has recent L hip fx and surgical repair and has been NWB for ~6 weeks. Pt is able to ambulate using hop to pattern with RW but states he was about to start HHPT to being weight bearing and walking with his prosthesis again. So will continue with that plan and recommend HHPT to begin gait training with prosthesis again. Feel that needs can be met at next level of care so acute PT is signing off at this time. Pt okay to return home with his wife and up to chair with nursing while remaining in the hospital.     Follow Up Recommendations Home health PT;Supervision for mobility/OOB    Equipment Recommendations  None recommended by PT    Recommendations for Other Services       Precautions / Restrictions Precautions Precautions: Fall Restrictions Weight Bearing Restrictions: Yes LLE Weight Bearing: Non weight bearing Other Position/Activity Restrictions: Pt is s/p L femoral neck fx and surgical repair and has been NWB for ~6 weeks now. Pt states that he was about to start HHPT to being weight bearing through his prosthesis.       Mobility  Bed Mobility Overal bed mobility: Modified Independent             General bed mobility comments: Independent with HOB elevated.   Transfers Overall transfer level: Needs assistance Equipment used: Rolling walker (2 wheeled) Transfers: Sit  to/from Stand Sit to Stand: Supervision         General transfer comment: Pt supervision with sit to stand with RW for safety. No physical assistance needed.   Ambulation/Gait Ambulation/Gait assistance: Supervision Ambulation Distance (Feet): 4 Feet Assistive device: Rolling walker (2 wheeled) Gait Pattern/deviations: Step-to pattern;Decreased stride length;Trunk flexed (NWB LLE. ) Gait velocity: decreased   General Gait Details: Pt using hop to pattern with use of RW and NWB L LE. Steady with use of RW. Supervision for safety.   Stairs            Wheelchair Mobility    Modified Rankin (Stroke Patients Only)       Balance Overall balance assessment: Needs assistance Sitting-balance support: No upper extremity supported;Feet unsupported Sitting balance-Leahy Scale: Good     Standing balance support: Bilateral upper extremity supported Standing balance-Leahy Scale: Good Standing balance comment: steady with use of RW.                              Pertinent Vitals/Pain Pain Assessment: No/denies pain  Vitals stable throughout activity.     Home Living Family/patient expects to be discharged to:: Private residence Living Arrangements: Spouse/significant other;Children Available Help at Discharge: Family;Available 24 hours/day Type of Home: House Home Access: Ramped entrance     Home Layout: One level Home Equipment: Walker - 2 wheels;Shower seat;Bedside commode;Wheelchair - manual      Prior Function Level of Independence: Needs  assistance   Gait / Transfers Assistance Needed: Pt has been using hop to pattern with RW since he has been NWB on the LLE. Walked independently with prosthesis before breaking his hip.   ADL's / Homemaking Assistance Needed: wife provides some assist        Hand Dominance   Dominant Hand: Right    Extremity/Trunk Assessment   Upper Extremity Assessment: Overall WFL for tasks assessed           Lower  Extremity Assessment: Overall WFL for tasks assessed      Cervical / Trunk Assessment: Normal  Communication   Communication: No difficulties  Cognition Arousal/Alertness: Awake/alert Behavior During Therapy: WFL for tasks assessed/performed Overall Cognitive Status: Within Functional Limits for tasks assessed                      General Comments General comments (skin integrity, edema, etc.): Pt pleasant and cooperative with therapy.     Exercises        Assessment/Plan    PT Assessment All further PT needs can be met in the next venue of care  PT Diagnosis Difficulty walking;Abnormality of gait   PT Problem List Decreased balance;Decreased mobility  PT Treatment Interventions     PT Goals (Current goals can be found in the Care Plan section) Acute Rehab PT Goals Patient Stated Goal: to go home and start walking with his prosthesis again PT Goal Formulation: With patient Potential to Achieve Goals: Good    Frequency     Barriers to discharge        Co-evaluation               End of Session Equipment Utilized During Treatment: Gait belt Activity Tolerance: Patient tolerated treatment well Patient left: in chair;with call bell/phone within reach;with chair alarm set Nurse Communication: Mobility status    Functional Assessment Tool Used: clinical judgment Functional Limitation: Mobility: Walking and moving around Mobility: Walking and Moving Around Current Status (F1638): At least 1 percent but less than 20 percent impaired, limited or restricted Mobility: Walking and Moving Around Goal Status [phone]): At least 1 percent but less than 20 percent impaired, limited or restricted Mobility: Walking and Moving Around Discharge Status [phone]): At least 1 percent but less than 20 percent impaired, limited or restricted    Time: 1779-3903 PT Time Calculation (min) (ACUTE ONLY): 21 min   Charges:   PT Evaluation $PT Eval Low Complexity: 1 Procedure      PT G Codes:   PT G-Codes **NOT FOR INPATIENT CLASS** Functional Assessment Tool Used: clinical judgment Functional Limitation: Mobility: Walking and moving around Mobility: Walking and Moving Around Current Status (E0923): At least 1 percent but less than 20 percent impaired, limited or restricted Mobility: Walking and Moving Around Goal Status [phone]): At least 1 percent but less than 20 percent impaired, limited or restricted Mobility: Walking and Moving Around Discharge Status [phone]): At least 1 percent but less than 20 percent impaired, limited or restricted   Colon Branch, SPT Colon Branch 07/02/2015, 3:06 PM

## 2015-07-02 NOTE — ED Notes (Signed)
Patient transported to X-ray 

## 2015-07-02 NOTE — ED Notes (Signed)
Attempted to call report

## 2015-07-03 DIAGNOSIS — I130 Hypertensive heart and chronic kidney disease with heart failure and stage 1 through stage 4 chronic kidney disease, or unspecified chronic kidney disease: Secondary | ICD-10-CM | POA: Diagnosis not present

## 2015-07-03 DIAGNOSIS — R55 Syncope and collapse: Secondary | ICD-10-CM | POA: Diagnosis not present

## 2015-07-03 DIAGNOSIS — I5032 Chronic diastolic (congestive) heart failure: Secondary | ICD-10-CM | POA: Diagnosis not present

## 2015-07-03 DIAGNOSIS — E119 Type 2 diabetes mellitus without complications: Secondary | ICD-10-CM | POA: Diagnosis not present

## 2015-07-03 DIAGNOSIS — I10 Essential (primary) hypertension: Secondary | ICD-10-CM

## 2015-07-03 LAB — CBC
HCT: 28.3 % — ABNORMAL LOW (ref 39.0–52.0)
HEMOGLOBIN: 9.1 g/dL — AB (ref 13.0–17.0)
MCH: 27.9 pg (ref 26.0–34.0)
MCHC: 32.2 g/dL (ref 30.0–36.0)
MCV: 86.8 fL (ref 78.0–100.0)
PLATELETS: 202 10*3/uL (ref 150–400)
RBC: 3.26 MIL/uL — AB (ref 4.22–5.81)
RDW: 12.4 % (ref 11.5–15.5)
WBC: 6.2 10*3/uL (ref 4.0–10.5)

## 2015-07-03 LAB — HEMOGLOBIN A1C
Hgb A1c MFr Bld: 6.4 % — ABNORMAL HIGH (ref 4.8–5.6)
Mean Plasma Glucose: 137 mg/dL

## 2015-07-03 LAB — BASIC METABOLIC PANEL
ANION GAP: 7 (ref 5–15)
BUN: 44 mg/dL — ABNORMAL HIGH (ref 6–20)
CALCIUM: 8.9 mg/dL (ref 8.9–10.3)
CO2: 25 mmol/L (ref 22–32)
CREATININE: 1.96 mg/dL — AB (ref 0.61–1.24)
Chloride: 108 mmol/L (ref 101–111)
GFR, EST AFRICAN AMERICAN: 39 mL/min — AB (ref >60)
GFR, EST NON AFRICAN AMERICAN: 33 mL/min — AB (ref >60)
Glucose, Bld: 173 mg/dL — ABNORMAL HIGH (ref 65–99)
Potassium: 4.3 mmol/L (ref 3.5–5.1)
SODIUM: 140 mmol/L (ref 135–145)

## 2015-07-03 LAB — GLUCOSE, CAPILLARY: GLUCOSE-CAPILLARY: 114 mg/dL — AB (ref 65–99)

## 2015-07-03 MED ORDER — DOCUSATE SODIUM 100 MG PO CAPS: 100.0000 mg | ORAL_CAPSULE | Freq: Two times a day (BID) | ORAL | Status: DC

## 2015-07-03 NOTE — Discharge Instructions (Signed)
Syncope, commonly known as fainting, is a temporary loss of consciousness. It occurs when the blood flow to the brain is reduced. Vasovagal syncope (also called neurocardiogenic syncope) is a fainting spell in which the blood flow to the brain is reduced because of a sudden drop in heart rate and blood pressure. Vasovagal syncope occurs when the brain and the cardiovascular system (blood vessels) do not adequately communicate and respond to each other. This is the most common cause of fainting. It often occurs in response to fear or some other type of emotional or physical stress. The body has a reaction in which the heart starts beating too slowly or the blood vessels expand, reducing blood pressure. This type of fainting spell is generally considered harmless. However, injuries can occur if a person takes a sudden fall during a fainting spell.   CAUSES   Vasovagal syncope occurs when a person's blood pressure and heart rate decrease suddenly, usually in response to a trigger. Many things and situations can trigger an episode. Some of these include:    Pain.    Fear.    The sight of blood or medical procedures, such as blood being drawn from a vein.    Common activities, such as coughing, swallowing, stretching, or going to the bathroom.    Emotional stress.    Prolonged standing, especially in a warm environment.    Lack of sleep or rest.    Prolonged lack of food.    Prolonged lack of fluids.    Recent illness.   The use of certain drugs that affect blood pressure, such as cocaine, alcohol, marijuana, inhalants, and opiates.   SYMPTOMS   Before the fainting episode, you may:    Feel dizzy or light headed.    Become pale.   Sense that you are going to faint.    Feel like the room is spinning.    Have tunnel vision, only seeing directly in front of you.    Feel sick to your stomach (nauseous).    See spots or slowly lose vision.    Hear ringing in your ears.    Have a headache.     Feel warm and sweaty.    Feel a sensation of pins and needles.  During the fainting spell, you will generally be unconscious for no longer than a couple minutes before waking up and returning to normal. If you get up too quickly before your body can recover, you may faint again. Some twitching or jerky movements may occur during the fainting spell.   DIAGNOSIS   Your health care provider will ask about your symptoms, take a medical history, and perform a physical exam. Various tests may be done to rule out other causes of fainting. These may include blood tests and tests to check the heart, such as electrocardiography, echocardiography, and possibly an electrophysiology study. When other causes have been ruled out, a test may be done to check the body's response to changes in position (tilt table test).  TREATMENT   Most cases of vasovagal syncope do not require treatment. Your health care provider may recommend ways to avoid fainting triggers and may provide home strategies for preventing fainting. If you must be exposed to a possible trigger, you can drink additional fluids to help reduce your chances of having an episode of vasovagal syncope. If you have warning signs of an oncoming episode, you can respond by positioning yourself favorably (lying down).  If your fainting spells continue, you may be   given medicines to prevent fainting. Some medicines may help make you more resistant to repeated episodes of vasovagal syncope. Special exercises or compression stockings may be recommended. In rare cases, the surgical placement of a pacemaker is considered.  HOME CARE INSTRUCTIONS    Learn to identify the warning signs of vasovagal syncope.    Sit or lie down at the first warning sign of a fainting spell. If sitting, put your head down between your legs. If you lie down, swing your legs up in the air to increase blood flow to the brain.    Avoid hot tubs and saunas.   Avoid prolonged standing.   Drink  enough fluids to keep your urine clear or pale yellow. Avoid caffeine.   Increase salt in your diet as directed by your health care provider.    If you have to stand for a long time, perform movements such as:     Crossing your legs.     Flexing and stretching your leg muscles.     Squatting.     Moving your legs.     Bending over.    Only take over-the-counter or prescription medicines as directed by your health care provider. Do not suddenly stop any medicines without asking your health care provider first.  SEEK MEDICAL CARE IF:    Your fainting spells continue or happen more frequently in spite of treatment.    You lose consciousness for more than a couple minutes.   You have fainting spells during or after exercising or after being startled.    You have new symptoms that occur with the fainting spells, such as:     Shortness of breath.    Chest pain.     Irregular heartbeat.    You have episodes of twitching or jerky movements that last longer than a few seconds.   You have episodes of twitching or jerky movements without obvious fainting.  SEEK IMMEDIATE MEDICAL CARE IF:    You have injuries or bleeding after a fainting spell.    You have episodes of twitching or jerky movements that last longer than 5 minutes.    You have more than one spell of twitching or jerky movements before returning to consciousness after fainting.     This information is not intended to replace advice given to you by your health care provider. Make sure you discuss any questions you have with your health care provider.     Document Released: 02/21/2012 Document Revised: 07/21/2014 Document Reviewed: 02/21/2012  Elsevier Interactive Patient Education 2016 [employer]

## 2015-07-03 NOTE — Progress Notes (Signed)
Reviewed discharge instructions with patient. IV removed. MD recommended Home Health PT family states that they have already have PT set up they are going to resume what is already set up. Awaiting wheelchair for discharge.   Rylann Munford, Mervin Kung RN

## 2015-07-03 NOTE — Discharge Summary (Addendum)
Physician Discharge Summary  [name] [id number] DOB: 11/30/1945 DOA: 07/02/2015  PCP: [name], MD  Admit date: 07/02/2015 Discharge date: 07/03/2015  Time spent: 20 minutes  Recommendations for Outpatient Follow-up:  1. Follow up with PCP in 2-3 weeks   Discharge Diagnoses:  Principal Problem:   Syncope Active Problems:   DM2 (diabetes mellitus, type 2) ([place])   Hypertension   Anemia   CKD (chronic kidney disease) stage 3, GFR 30-59 ml/min   Chronic diastolic heart failure ([place])   Seizure disorder [facility])   Discharge Condition: Stable  Diet recommendation: Diabetic, heart healthy  Filed Weights   07/02/15 0618 07/03/15 0607  Weight: 72.576 kg (160 lb) 64.5 kg (142 lb 3.2 oz)    History of present illness:  Please review dictated H and P from 4/14 for details. Briefly, 70yo with hx of diabetes, hypertension, stroke, seizures, chronic kidney disease stage II, peripheral vascular disease status post right BKA in 2014, he sent femoral neck fracture status post surgical repair presents to the emergency department with the chief complaint of syncope  Hospital Course:  #1. Near-syncope, likely vasovagal.CT of the head without acute abnormality, chest x-ray with no active cardiopulmonary disease, no signs of infectious process. No metabolic derangement. EKG without acute changes. Negative orthostasis -Admited to telemetry -Given very gentle IV fluids overnight -Held diuretics -2d echo was already done recently in 11/16, thus was not repeated -On further questioning, patient reports near syncope without LOC that started while bearing down hard while having a BM. Very likely vaso vagal. No recurrence of symptoms while admitted  #2. Chronic kidney disease stage III. Creatinine is 2.18 on admission. Review indicates this is close to baseline. -Gentle IV fluids  #3. Chronic diastolic heart failure. Appears compensated. Last echo November 2016 with an EF 40-45% and  grade 2 diastolic dysfunction. Currently on hydrochlorothiazide 50 mg daily and Coreg. -Monitor closely as I'm giving gentle fluids and holding diuretic -Continued beta blocker with parameters  #4. Diabetes. Home medications include Levemir 14 units. Serum glucose 135 on admission. -Continued Levemir at a slightly lower dose -Provide carb modified diet -Obtain hemoglobin A1c -Use sliding scale insulin for optimal control -Resume home meds on discharge  #5. Seizure disorder. -Continued keppra  #6. Hypertension. Fair control in the emergency department. Blood pressure on the high end of normal. reportedly systolic blood pressure 60 per EMS. Was given 500 mL of normal saline medications include hydrochlorothiazide and Coreg -Briefly held hydrochlorothiazide per above -Continued beta blocker with parameters  #7. Anemia. Likely related to chronic disease. Hemoglobin 10.5 on admission. Chart review indicates this is his baseline. No signs symptoms of active bleeding  Discharge Exam: Filed Vitals:   07/02/15 1123 07/02/15 2005 07/03/15 0607 07/03/15 0824  BP: 142/58 164/65 142/66   Pulse: 65 64 70 70  Temp:  97.8 F (36.6 C) 97.9 F (36.6 C)   TempSrc:  Oral Oral   Resp: 18 18 18    Height:      Weight:   64.5 kg (142 lb 3.2 oz)   SpO2: 100% 100% 98%     General: Awake, in nad Cardiovascular: regular, s1, s2 Respiratory: normal resp effort, no wheezing  Discharge Instructions     Medication List    TAKE these medications        aspirin 325 MG tablet  Take 325 mg by mouth daily.     atorvastatin 20 MG tablet  Commonly known as:  LIPITOR  Take 20 mg by mouth  daily.     carvedilol 6.25 MG tablet  Commonly known as:  COREG  Take 1 tablet (6.25 mg total) by mouth 2 (two) times daily with a meal.     CENTRUM SILVER ADULT 50+ PO  Take 1 tablet by mouth daily.     docusate sodium 100 MG capsule  Commonly known as:  COLACE  Take 1 capsule (100 mg total) by mouth 2 (two)  times daily.     hydrochlorothiazide 50 MG tablet  Commonly known as:  HYDRODIURIL  Take 50 mg by mouth daily.     HYDROcodone-acetaminophen 5-325 MG tablet  Commonly known as:  NORCO  Take 1 tablet by mouth every 6 (six) hours as needed.     insulin detemir 100 UNIT/ML injection  Commonly known as:  LEVEMIR  Inject 14 Units into the skin at bedtime.     levETIRAcetam 500 MG tablet  Commonly known as:  KEPPRA  Take 1 tablet (500 mg total) by mouth 2 (two) times daily.     memantine 10 MG tablet  Commonly known as:  NAMENDA  Take 1 tablet (10 mg total) by mouth 2 (two) times daily.     PRESCRIPTION MEDICATION  Take 1 capsule by mouth 2 (two) times daily. White capsule for neuropathy in fingers, sample meds from MD     SENOKOT TO GO 8.6 MG tablet  Generic drug:  senna  Take 1 tablet by mouth every evening.     tamsulosin 0.4 MG Caps capsule  Commonly known as:  FLOMAX  Take 1 capsule (0.4 mg total) by mouth daily after supper.     timolol 0.5 % ophthalmic solution  Commonly known as:  BETIMOL  Place 1 drop into both eyes 2 (two) times daily.     Travoprost (BAK Free) 0.004 % Soln ophthalmic solution  Commonly known as:  [name]  Place 1 drop into both eyes at bedtime.     venlafaxine XR 75 MG 24 hr capsule  Commonly known as:  EFFEXOR-XR  Take 75 mg by mouth daily.     vitamin C 500 MG tablet  Commonly known as:  ASCORBIC ACID  Take 500 mg by mouth daily.       Allergies  Allergen Reactions  . Codeine Anaphylaxis  . Penicillins Anaphylaxis   Follow-up Information    Follow up with [name], MD. Schedule an appointment as soon as possible for a visit in 2 weeks.   Specialty:  Endocrinology   Why:  Hospital follow up   Contact information:   [address] [place] [address] [phone]        The results of significant diagnostics from this hospitalization (including imaging, microbiology, ancillary and laboratory) are  listed below for reference.    Significant Diagnostic Studies: X-ray Chest Pa And Lateral  07/02/2015  CLINICAL DATA:  Syncope. EXAM: CHEST  2 VIEW COMPARISON:  December 06, 2012. FINDINGS: The heart size and mediastinal contours are within normal limits. Both lungs are clear. No pneumothorax or pleural effusion is noted. Old right upper rib fractures are noted. IMPRESSION: No active cardiopulmonary disease. Electronically Signed   By: Marijo Conception, M.D.   On: 07/02/2015 10:53   Ct Head Wo Contrast  07/02/2015  CLINICAL DATA:  Recent syncopal episode EXAM: CT HEAD WITHOUT CONTRAST TECHNIQUE: Contiguous axial images were obtained from the base of the skull through the vertex without intravenous contrast. COMPARISON:  01/29/2014 FINDINGS: The bony calvarium is intact. Changes  of prior left occipital infarct are seen. Scattered areas of decreased attenuation are noted consistent with chronic white matter ischemic change. These are all stable from the prior study. Thinner infarct is noted adjacent to the head of the caudate nucleus extending into the corona radiata on the right anteriorly. No findings to suggest acute hemorrhage, acute infarction or space-occupying mass lesion are noted. IMPRESSION: Chronic ischemic changes as described.  No acute abnormality noted. Electronically Signed   By: Inez Catalina M.D.   On: 07/02/2015 09:25    Microbiology: No results found for this or any previous visit (from the past 240 hour(s)).   Labs: Basic Metabolic Panel:  Recent Labs Lab 07/02/15 0644 07/03/15 0328  NA 139 140  K 4.2 4.3  CL 107 108  CO2 23 25  GLUCOSE 135* 173*  BUN 51* 44*  CREATININE 2.31*  2.18* 1.96*  CALCIUM 9.3 8.9   Liver Function Tests: No results for input(s): AST, ALT, ALKPHOS, BILITOT, PROT, ALBUMIN in the last 168 hours. No results for input(s): LIPASE, AMYLASE in the last 168 hours. No results for input(s): AMMONIA in the last 168 hours. CBC:  Recent Labs Lab  07/02/15 0644 07/03/15 0328  WBC 7.3 6.2  HGB 10.5* 9.1*  HCT 31.7* 28.3*  MCV 87.6 86.8  PLT 202 202   Cardiac Enzymes:  Recent Labs Lab 07/02/15 1510  TROPONINI <0.03   BNP: BNP (last 3 results) No results for input(s): BNP in the last 8760 hours.  ProBNP (last 3 results) No results for input(s): PROBNP in the last 8760 hours.  CBG:  Recent Labs Lab 07/02/15 0654 07/02/15 1243 07/02/15 1629 07/03/15 0628  GLUCAP 110* 134* 191* 114*    Signed:  Waynesha Rammel K  [facility] 07/03/2015, 6:00 PM

## 2015-07-05 ENCOUNTER — Other Ambulatory Visit: Payer: Self-pay | Admitting: Cardiovascular Disease

## 2015-07-07 DIAGNOSIS — Z8701 Personal history of pneumonia (recurrent): Secondary | ICD-10-CM | POA: Diagnosis not present

## 2015-07-07 DIAGNOSIS — Z9181 History of falling: Secondary | ICD-10-CM | POA: Diagnosis not present

## 2015-07-07 DIAGNOSIS — Z89421 Acquired absence of other right toe(s): Secondary | ICD-10-CM | POA: Diagnosis not present

## 2015-07-07 DIAGNOSIS — Z89522 Acquired absence of left knee: Secondary | ICD-10-CM | POA: Diagnosis not present

## 2015-07-07 DIAGNOSIS — N182 Chronic kidney disease, stage 2 (mild): Secondary | ICD-10-CM | POA: Diagnosis not present

## 2015-07-07 DIAGNOSIS — S72002D Fracture of unspecified part of neck of left femur, subsequent encounter for closed fracture with routine healing: Secondary | ICD-10-CM | POA: Diagnosis not present

## 2015-07-07 DIAGNOSIS — E1122 Type 2 diabetes mellitus with diabetic chronic kidney disease: Secondary | ICD-10-CM | POA: Diagnosis not present

## 2015-07-07 DIAGNOSIS — G709 Myoneural disorder, unspecified: Secondary | ICD-10-CM | POA: Diagnosis not present

## 2015-07-07 DIAGNOSIS — Z8673 Personal history of transient ischemic attack (TIA), and cerebral infarction without residual deficits: Secondary | ICD-10-CM | POA: Diagnosis not present

## 2015-07-07 DIAGNOSIS — I129 Hypertensive chronic kidney disease with stage 1 through stage 4 chronic kidney disease, or unspecified chronic kidney disease: Secondary | ICD-10-CM | POA: Diagnosis not present

## 2015-07-07 DIAGNOSIS — R569 Unspecified convulsions: Secondary | ICD-10-CM | POA: Diagnosis not present

## 2015-07-07 DIAGNOSIS — W010XXD Fall on same level from slipping, tripping and stumbling without subsequent striking against object, subsequent encounter: Secondary | ICD-10-CM | POA: Diagnosis not present

## 2015-07-07 DIAGNOSIS — E1151 Type 2 diabetes mellitus with diabetic peripheral angiopathy without gangrene: Secondary | ICD-10-CM | POA: Diagnosis not present

## 2015-07-09 DIAGNOSIS — G709 Myoneural disorder, unspecified: Secondary | ICD-10-CM | POA: Diagnosis not present

## 2015-07-09 DIAGNOSIS — E1122 Type 2 diabetes mellitus with diabetic chronic kidney disease: Secondary | ICD-10-CM | POA: Diagnosis not present

## 2015-07-09 DIAGNOSIS — W010XXD Fall on same level from slipping, tripping and stumbling without subsequent striking against object, subsequent encounter: Secondary | ICD-10-CM | POA: Diagnosis not present

## 2015-07-09 DIAGNOSIS — R569 Unspecified convulsions: Secondary | ICD-10-CM | POA: Diagnosis not present

## 2015-07-09 DIAGNOSIS — S72002D Fracture of unspecified part of neck of left femur, subsequent encounter for closed fracture with routine healing: Secondary | ICD-10-CM | POA: Diagnosis not present

## 2015-07-09 DIAGNOSIS — E1151 Type 2 diabetes mellitus with diabetic peripheral angiopathy without gangrene: Secondary | ICD-10-CM | POA: Diagnosis not present

## 2015-07-09 DIAGNOSIS — Z8673 Personal history of transient ischemic attack (TIA), and cerebral infarction without residual deficits: Secondary | ICD-10-CM | POA: Diagnosis not present

## 2015-07-09 DIAGNOSIS — Z8701 Personal history of pneumonia (recurrent): Secondary | ICD-10-CM | POA: Diagnosis not present

## 2015-07-09 DIAGNOSIS — Z89522 Acquired absence of left knee: Secondary | ICD-10-CM | POA: Diagnosis not present

## 2015-07-09 DIAGNOSIS — I129 Hypertensive chronic kidney disease with stage 1 through stage 4 chronic kidney disease, or unspecified chronic kidney disease: Secondary | ICD-10-CM | POA: Diagnosis not present

## 2015-07-09 DIAGNOSIS — N182 Chronic kidney disease, stage 2 (mild): Secondary | ICD-10-CM | POA: Diagnosis not present

## 2015-07-09 DIAGNOSIS — Z9181 History of falling: Secondary | ICD-10-CM | POA: Diagnosis not present

## 2015-07-09 DIAGNOSIS — Z89421 Acquired absence of other right toe(s): Secondary | ICD-10-CM | POA: Diagnosis not present

## 2015-07-12 DIAGNOSIS — G709 Myoneural disorder, unspecified: Secondary | ICD-10-CM | POA: Diagnosis not present

## 2015-07-12 DIAGNOSIS — W010XXD Fall on same level from slipping, tripping and stumbling without subsequent striking against object, subsequent encounter: Secondary | ICD-10-CM | POA: Diagnosis not present

## 2015-07-12 DIAGNOSIS — S72002D Fracture of unspecified part of neck of left femur, subsequent encounter for closed fracture with routine healing: Secondary | ICD-10-CM | POA: Diagnosis not present

## 2015-07-12 DIAGNOSIS — I129 Hypertensive chronic kidney disease with stage 1 through stage 4 chronic kidney disease, or unspecified chronic kidney disease: Secondary | ICD-10-CM | POA: Diagnosis not present

## 2015-07-12 DIAGNOSIS — E1151 Type 2 diabetes mellitus with diabetic peripheral angiopathy without gangrene: Secondary | ICD-10-CM | POA: Diagnosis not present

## 2015-07-12 DIAGNOSIS — E1122 Type 2 diabetes mellitus with diabetic chronic kidney disease: Secondary | ICD-10-CM | POA: Diagnosis not present

## 2015-07-12 DIAGNOSIS — Z8673 Personal history of transient ischemic attack (TIA), and cerebral infarction without residual deficits: Secondary | ICD-10-CM | POA: Diagnosis not present

## 2015-07-12 DIAGNOSIS — Z89522 Acquired absence of left knee: Secondary | ICD-10-CM | POA: Diagnosis not present

## 2015-07-12 DIAGNOSIS — R569 Unspecified convulsions: Secondary | ICD-10-CM | POA: Diagnosis not present

## 2015-07-12 DIAGNOSIS — Z89421 Acquired absence of other right toe(s): Secondary | ICD-10-CM | POA: Diagnosis not present

## 2015-07-12 DIAGNOSIS — Z9181 History of falling: Secondary | ICD-10-CM | POA: Diagnosis not present

## 2015-07-12 DIAGNOSIS — Z8701 Personal history of pneumonia (recurrent): Secondary | ICD-10-CM | POA: Diagnosis not present

## 2015-07-12 DIAGNOSIS — N182 Chronic kidney disease, stage 2 (mild): Secondary | ICD-10-CM | POA: Diagnosis not present

## 2015-07-14 DIAGNOSIS — Z89421 Acquired absence of other right toe(s): Secondary | ICD-10-CM | POA: Diagnosis not present

## 2015-07-14 DIAGNOSIS — Z9181 History of falling: Secondary | ICD-10-CM | POA: Diagnosis not present

## 2015-07-14 DIAGNOSIS — Z8701 Personal history of pneumonia (recurrent): Secondary | ICD-10-CM | POA: Diagnosis not present

## 2015-07-14 DIAGNOSIS — E1122 Type 2 diabetes mellitus with diabetic chronic kidney disease: Secondary | ICD-10-CM | POA: Diagnosis not present

## 2015-07-14 DIAGNOSIS — N182 Chronic kidney disease, stage 2 (mild): Secondary | ICD-10-CM | POA: Diagnosis not present

## 2015-07-14 DIAGNOSIS — I129 Hypertensive chronic kidney disease with stage 1 through stage 4 chronic kidney disease, or unspecified chronic kidney disease: Secondary | ICD-10-CM | POA: Diagnosis not present

## 2015-07-14 DIAGNOSIS — W010XXD Fall on same level from slipping, tripping and stumbling without subsequent striking against object, subsequent encounter: Secondary | ICD-10-CM | POA: Diagnosis not present

## 2015-07-14 DIAGNOSIS — Z8673 Personal history of transient ischemic attack (TIA), and cerebral infarction without residual deficits: Secondary | ICD-10-CM | POA: Diagnosis not present

## 2015-07-14 DIAGNOSIS — R569 Unspecified convulsions: Secondary | ICD-10-CM | POA: Diagnosis not present

## 2015-07-14 DIAGNOSIS — G709 Myoneural disorder, unspecified: Secondary | ICD-10-CM | POA: Diagnosis not present

## 2015-07-14 DIAGNOSIS — S72002D Fracture of unspecified part of neck of left femur, subsequent encounter for closed fracture with routine healing: Secondary | ICD-10-CM | POA: Diagnosis not present

## 2015-07-14 DIAGNOSIS — Z89522 Acquired absence of left knee: Secondary | ICD-10-CM | POA: Diagnosis not present

## 2015-07-14 DIAGNOSIS — E1151 Type 2 diabetes mellitus with diabetic peripheral angiopathy without gangrene: Secondary | ICD-10-CM | POA: Diagnosis not present

## 2015-07-16 DIAGNOSIS — N182 Chronic kidney disease, stage 2 (mild): Secondary | ICD-10-CM | POA: Diagnosis not present

## 2015-07-16 DIAGNOSIS — I129 Hypertensive chronic kidney disease with stage 1 through stage 4 chronic kidney disease, or unspecified chronic kidney disease: Secondary | ICD-10-CM | POA: Diagnosis not present

## 2015-07-16 DIAGNOSIS — E1151 Type 2 diabetes mellitus with diabetic peripheral angiopathy without gangrene: Secondary | ICD-10-CM | POA: Diagnosis not present

## 2015-07-16 DIAGNOSIS — Z8701 Personal history of pneumonia (recurrent): Secondary | ICD-10-CM | POA: Diagnosis not present

## 2015-07-16 DIAGNOSIS — Z89421 Acquired absence of other right toe(s): Secondary | ICD-10-CM | POA: Diagnosis not present

## 2015-07-16 DIAGNOSIS — G709 Myoneural disorder, unspecified: Secondary | ICD-10-CM | POA: Diagnosis not present

## 2015-07-16 DIAGNOSIS — Z8673 Personal history of transient ischemic attack (TIA), and cerebral infarction without residual deficits: Secondary | ICD-10-CM | POA: Diagnosis not present

## 2015-07-16 DIAGNOSIS — R569 Unspecified convulsions: Secondary | ICD-10-CM | POA: Diagnosis not present

## 2015-07-16 DIAGNOSIS — W010XXD Fall on same level from slipping, tripping and stumbling without subsequent striking against object, subsequent encounter: Secondary | ICD-10-CM | POA: Diagnosis not present

## 2015-07-16 DIAGNOSIS — Z9181 History of falling: Secondary | ICD-10-CM | POA: Diagnosis not present

## 2015-07-16 DIAGNOSIS — E1122 Type 2 diabetes mellitus with diabetic chronic kidney disease: Secondary | ICD-10-CM | POA: Diagnosis not present

## 2015-07-16 DIAGNOSIS — Z89522 Acquired absence of left knee: Secondary | ICD-10-CM | POA: Diagnosis not present

## 2015-07-16 DIAGNOSIS — S72002D Fracture of unspecified part of neck of left femur, subsequent encounter for closed fracture with routine healing: Secondary | ICD-10-CM | POA: Diagnosis not present

## 2015-07-19 ENCOUNTER — Other Ambulatory Visit: Payer: Self-pay | Admitting: Cardiovascular Disease

## 2015-07-19 ENCOUNTER — Encounter (HOSPITAL_COMMUNITY): Payer: Medicare Other

## 2015-07-19 DIAGNOSIS — E118 Type 2 diabetes mellitus with unspecified complications: Secondary | ICD-10-CM | POA: Diagnosis not present

## 2015-07-19 DIAGNOSIS — Z89421 Acquired absence of other right toe(s): Secondary | ICD-10-CM | POA: Diagnosis not present

## 2015-07-19 DIAGNOSIS — W010XXD Fall on same level from slipping, tripping and stumbling without subsequent striking against object, subsequent encounter: Secondary | ICD-10-CM | POA: Diagnosis not present

## 2015-07-19 DIAGNOSIS — Z9181 History of falling: Secondary | ICD-10-CM | POA: Diagnosis not present

## 2015-07-19 DIAGNOSIS — Z8701 Personal history of pneumonia (recurrent): Secondary | ICD-10-CM | POA: Diagnosis not present

## 2015-07-19 DIAGNOSIS — E1151 Type 2 diabetes mellitus with diabetic peripheral angiopathy without gangrene: Secondary | ICD-10-CM | POA: Diagnosis not present

## 2015-07-19 DIAGNOSIS — S72002D Fracture of unspecified part of neck of left femur, subsequent encounter for closed fracture with routine healing: Secondary | ICD-10-CM | POA: Diagnosis not present

## 2015-07-19 DIAGNOSIS — N182 Chronic kidney disease, stage 2 (mild): Secondary | ICD-10-CM | POA: Diagnosis not present

## 2015-07-19 DIAGNOSIS — I509 Heart failure, unspecified: Secondary | ICD-10-CM

## 2015-07-19 DIAGNOSIS — R569 Unspecified convulsions: Secondary | ICD-10-CM | POA: Diagnosis not present

## 2015-07-19 DIAGNOSIS — Z89522 Acquired absence of left knee: Secondary | ICD-10-CM | POA: Diagnosis not present

## 2015-07-19 DIAGNOSIS — I129 Hypertensive chronic kidney disease with stage 1 through stage 4 chronic kidney disease, or unspecified chronic kidney disease: Secondary | ICD-10-CM | POA: Diagnosis not present

## 2015-07-19 DIAGNOSIS — Z8673 Personal history of transient ischemic attack (TIA), and cerebral infarction without residual deficits: Secondary | ICD-10-CM | POA: Diagnosis not present

## 2015-07-19 DIAGNOSIS — E1122 Type 2 diabetes mellitus with diabetic chronic kidney disease: Secondary | ICD-10-CM | POA: Diagnosis not present

## 2015-07-19 DIAGNOSIS — G709 Myoneural disorder, unspecified: Secondary | ICD-10-CM | POA: Diagnosis not present

## 2015-07-20 ENCOUNTER — Ambulatory Visit (HOSPITAL_BASED_OUTPATIENT_CLINIC_OR_DEPARTMENT_OTHER): Payer: Medicare Other

## 2015-07-20 ENCOUNTER — Ambulatory Visit (HOSPITAL_COMMUNITY)
Admission: RE | Admit: 2015-07-20 | Discharge: 2015-07-20 | Disposition: A | Discharge status: Home / Self Care | Payer: Medicare Other | Source: Ambulatory Visit | Attending: Cardiology | Admitting: Cardiology

## 2015-07-20 ENCOUNTER — Encounter (HOSPITAL_COMMUNITY): Payer: Medicare Other

## 2015-07-20 ENCOUNTER — Inpatient Hospital Stay (HOSPITAL_COMMUNITY): Admission: RE | Admit: 2015-07-20 | Payer: Medicare Other | Source: Ambulatory Visit

## 2015-07-20 ENCOUNTER — Other Ambulatory Visit: Payer: Self-pay

## 2015-07-20 DIAGNOSIS — E785 Hyperlipidemia, unspecified: Secondary | ICD-10-CM | POA: Diagnosis not present

## 2015-07-20 DIAGNOSIS — N183 Chronic kidney disease, stage 3 (moderate): Secondary | ICD-10-CM | POA: Diagnosis not present

## 2015-07-20 DIAGNOSIS — I130 Hypertensive heart and chronic kidney disease with heart failure and stage 1 through stage 4 chronic kidney disease, or unspecified chronic kidney disease: Secondary | ICD-10-CM | POA: Insufficient documentation

## 2015-07-20 DIAGNOSIS — I509 Heart failure, unspecified: Secondary | ICD-10-CM | POA: Insufficient documentation

## 2015-07-20 DIAGNOSIS — E1122 Type 2 diabetes mellitus with diabetic chronic kidney disease: Secondary | ICD-10-CM | POA: Diagnosis not present

## 2015-07-20 DIAGNOSIS — I2510 Atherosclerotic heart disease of native coronary artery without angina pectoris: Secondary | ICD-10-CM | POA: Insufficient documentation

## 2015-07-20 DIAGNOSIS — R0989 Other specified symptoms and signs involving the circulatory and respiratory systems: Secondary | ICD-10-CM

## 2015-07-20 DIAGNOSIS — I6523 Occlusion and stenosis of bilateral carotid arteries: Secondary | ICD-10-CM | POA: Insufficient documentation

## 2015-07-21 DIAGNOSIS — R569 Unspecified convulsions: Secondary | ICD-10-CM | POA: Diagnosis not present

## 2015-07-21 DIAGNOSIS — Z89421 Acquired absence of other right toe(s): Secondary | ICD-10-CM | POA: Diagnosis not present

## 2015-07-21 DIAGNOSIS — W010XXD Fall on same level from slipping, tripping and stumbling without subsequent striking against object, subsequent encounter: Secondary | ICD-10-CM | POA: Diagnosis not present

## 2015-07-21 DIAGNOSIS — S72002D Fracture of unspecified part of neck of left femur, subsequent encounter for closed fracture with routine healing: Secondary | ICD-10-CM | POA: Diagnosis not present

## 2015-07-21 DIAGNOSIS — I129 Hypertensive chronic kidney disease with stage 1 through stage 4 chronic kidney disease, or unspecified chronic kidney disease: Secondary | ICD-10-CM | POA: Diagnosis not present

## 2015-07-21 DIAGNOSIS — Z9181 History of falling: Secondary | ICD-10-CM | POA: Diagnosis not present

## 2015-07-21 DIAGNOSIS — G709 Myoneural disorder, unspecified: Secondary | ICD-10-CM | POA: Diagnosis not present

## 2015-07-21 DIAGNOSIS — N182 Chronic kidney disease, stage 2 (mild): Secondary | ICD-10-CM | POA: Diagnosis not present

## 2015-07-21 DIAGNOSIS — E1122 Type 2 diabetes mellitus with diabetic chronic kidney disease: Secondary | ICD-10-CM | POA: Diagnosis not present

## 2015-07-21 DIAGNOSIS — Z8701 Personal history of pneumonia (recurrent): Secondary | ICD-10-CM | POA: Diagnosis not present

## 2015-07-21 DIAGNOSIS — E1151 Type 2 diabetes mellitus with diabetic peripheral angiopathy without gangrene: Secondary | ICD-10-CM | POA: Diagnosis not present

## 2015-07-21 DIAGNOSIS — Z89522 Acquired absence of left knee: Secondary | ICD-10-CM | POA: Diagnosis not present

## 2015-07-21 DIAGNOSIS — Z8673 Personal history of transient ischemic attack (TIA), and cerebral infarction without residual deficits: Secondary | ICD-10-CM | POA: Diagnosis not present

## 2015-07-23 DIAGNOSIS — G709 Myoneural disorder, unspecified: Secondary | ICD-10-CM | POA: Diagnosis not present

## 2015-07-23 DIAGNOSIS — E1122 Type 2 diabetes mellitus with diabetic chronic kidney disease: Secondary | ICD-10-CM | POA: Diagnosis not present

## 2015-07-23 DIAGNOSIS — Z8701 Personal history of pneumonia (recurrent): Secondary | ICD-10-CM | POA: Diagnosis not present

## 2015-07-23 DIAGNOSIS — R569 Unspecified convulsions: Secondary | ICD-10-CM | POA: Diagnosis not present

## 2015-07-23 DIAGNOSIS — W010XXD Fall on same level from slipping, tripping and stumbling without subsequent striking against object, subsequent encounter: Secondary | ICD-10-CM | POA: Diagnosis not present

## 2015-07-23 DIAGNOSIS — S72002D Fracture of unspecified part of neck of left femur, subsequent encounter for closed fracture with routine healing: Secondary | ICD-10-CM | POA: Diagnosis not present

## 2015-07-23 DIAGNOSIS — Z89522 Acquired absence of left knee: Secondary | ICD-10-CM | POA: Diagnosis not present

## 2015-07-23 DIAGNOSIS — N182 Chronic kidney disease, stage 2 (mild): Secondary | ICD-10-CM | POA: Diagnosis not present

## 2015-07-23 DIAGNOSIS — E1151 Type 2 diabetes mellitus with diabetic peripheral angiopathy without gangrene: Secondary | ICD-10-CM | POA: Diagnosis not present

## 2015-07-23 DIAGNOSIS — Z8673 Personal history of transient ischemic attack (TIA), and cerebral infarction without residual deficits: Secondary | ICD-10-CM | POA: Diagnosis not present

## 2015-07-23 DIAGNOSIS — Z9181 History of falling: Secondary | ICD-10-CM | POA: Diagnosis not present

## 2015-07-23 DIAGNOSIS — Z89421 Acquired absence of other right toe(s): Secondary | ICD-10-CM | POA: Diagnosis not present

## 2015-07-23 DIAGNOSIS — I129 Hypertensive chronic kidney disease with stage 1 through stage 4 chronic kidney disease, or unspecified chronic kidney disease: Secondary | ICD-10-CM | POA: Diagnosis not present

## 2015-07-26 DIAGNOSIS — H401113 Primary open-angle glaucoma, right eye, severe stage: Secondary | ICD-10-CM | POA: Diagnosis not present

## 2015-07-26 DIAGNOSIS — H401123 Primary open-angle glaucoma, left eye, severe stage: Secondary | ICD-10-CM | POA: Diagnosis not present

## 2015-07-27 DIAGNOSIS — W010XXD Fall on same level from slipping, tripping and stumbling without subsequent striking against object, subsequent encounter: Secondary | ICD-10-CM | POA: Diagnosis not present

## 2015-07-27 DIAGNOSIS — Z9181 History of falling: Secondary | ICD-10-CM | POA: Diagnosis not present

## 2015-07-27 DIAGNOSIS — Z8701 Personal history of pneumonia (recurrent): Secondary | ICD-10-CM | POA: Diagnosis not present

## 2015-07-27 DIAGNOSIS — S72002D Fracture of unspecified part of neck of left femur, subsequent encounter for closed fracture with routine healing: Secondary | ICD-10-CM | POA: Diagnosis not present

## 2015-07-27 DIAGNOSIS — R569 Unspecified convulsions: Secondary | ICD-10-CM | POA: Diagnosis not present

## 2015-07-27 DIAGNOSIS — G709 Myoneural disorder, unspecified: Secondary | ICD-10-CM | POA: Diagnosis not present

## 2015-07-27 DIAGNOSIS — E1122 Type 2 diabetes mellitus with diabetic chronic kidney disease: Secondary | ICD-10-CM | POA: Diagnosis not present

## 2015-07-27 DIAGNOSIS — Z8673 Personal history of transient ischemic attack (TIA), and cerebral infarction without residual deficits: Secondary | ICD-10-CM | POA: Diagnosis not present

## 2015-07-27 DIAGNOSIS — N182 Chronic kidney disease, stage 2 (mild): Secondary | ICD-10-CM | POA: Diagnosis not present

## 2015-07-27 DIAGNOSIS — E1151 Type 2 diabetes mellitus with diabetic peripheral angiopathy without gangrene: Secondary | ICD-10-CM | POA: Diagnosis not present

## 2015-07-27 DIAGNOSIS — I129 Hypertensive chronic kidney disease with stage 1 through stage 4 chronic kidney disease, or unspecified chronic kidney disease: Secondary | ICD-10-CM | POA: Diagnosis not present

## 2015-07-27 DIAGNOSIS — Z89522 Acquired absence of left knee: Secondary | ICD-10-CM | POA: Diagnosis not present

## 2015-07-27 DIAGNOSIS — Z89421 Acquired absence of other right toe(s): Secondary | ICD-10-CM | POA: Diagnosis not present

## 2015-07-29 DIAGNOSIS — N182 Chronic kidney disease, stage 2 (mild): Secondary | ICD-10-CM | POA: Diagnosis not present

## 2015-07-29 DIAGNOSIS — Z89522 Acquired absence of left knee: Secondary | ICD-10-CM | POA: Diagnosis not present

## 2015-07-29 DIAGNOSIS — E1122 Type 2 diabetes mellitus with diabetic chronic kidney disease: Secondary | ICD-10-CM | POA: Diagnosis not present

## 2015-07-29 DIAGNOSIS — Z89421 Acquired absence of other right toe(s): Secondary | ICD-10-CM | POA: Diagnosis not present

## 2015-07-29 DIAGNOSIS — W010XXD Fall on same level from slipping, tripping and stumbling without subsequent striking against object, subsequent encounter: Secondary | ICD-10-CM | POA: Diagnosis not present

## 2015-07-29 DIAGNOSIS — R569 Unspecified convulsions: Secondary | ICD-10-CM | POA: Diagnosis not present

## 2015-07-29 DIAGNOSIS — Z8701 Personal history of pneumonia (recurrent): Secondary | ICD-10-CM | POA: Diagnosis not present

## 2015-07-29 DIAGNOSIS — I129 Hypertensive chronic kidney disease with stage 1 through stage 4 chronic kidney disease, or unspecified chronic kidney disease: Secondary | ICD-10-CM | POA: Diagnosis not present

## 2015-07-29 DIAGNOSIS — E1151 Type 2 diabetes mellitus with diabetic peripheral angiopathy without gangrene: Secondary | ICD-10-CM | POA: Diagnosis not present

## 2015-07-29 DIAGNOSIS — Z9181 History of falling: Secondary | ICD-10-CM | POA: Diagnosis not present

## 2015-07-29 DIAGNOSIS — G709 Myoneural disorder, unspecified: Secondary | ICD-10-CM | POA: Diagnosis not present

## 2015-07-29 DIAGNOSIS — Z8673 Personal history of transient ischemic attack (TIA), and cerebral infarction without residual deficits: Secondary | ICD-10-CM | POA: Diagnosis not present

## 2015-07-29 DIAGNOSIS — S72002D Fracture of unspecified part of neck of left femur, subsequent encounter for closed fracture with routine healing: Secondary | ICD-10-CM | POA: Diagnosis not present

## 2015-07-30 ENCOUNTER — Encounter: Payer: Self-pay | Admitting: Cardiovascular Disease

## 2015-07-30 ENCOUNTER — Ambulatory Visit (INDEPENDENT_AMBULATORY_CARE_PROVIDER_SITE_OTHER): Payer: Medicare Other | Admitting: Cardiovascular Disease

## 2015-07-30 VITALS — BP 130/60 | HR 97 | Ht 70.0 in | Wt 156.0 lb

## 2015-07-30 DIAGNOSIS — I2510 Atherosclerotic heart disease of native coronary artery without angina pectoris: Secondary | ICD-10-CM | POA: Diagnosis not present

## 2015-07-30 DIAGNOSIS — I5022 Chronic systolic (congestive) heart failure: Secondary | ICD-10-CM | POA: Diagnosis not present

## 2015-07-30 DIAGNOSIS — I2583 Coronary atherosclerosis due to lipid rich plaque: Principal | ICD-10-CM

## 2015-07-30 NOTE — Patient Instructions (Signed)
**Note De-identified  Obfuscation** Medication Instructions:  Same-no changes  Labwork: None  Testing/Procedures: None  Follow-Up: Your physician wants you to follow-up in: 6 months. You will receive a reminder letter in the mail two months in advance. If you don't receive a letter, please call our office to schedule the follow-up appointment.      If you need a refill on your cardiac medications before your next appointment, please call your pharmacy.   

## 2015-07-30 NOTE — Progress Notes (Signed)
Patient ID: Devin [name], male   DOB: April 04, 1945, 70 y.o.   MRN: [id number]  Devin [name] is [name] 70 y.o. M with history of DM, CKD stage II, HTN, seizure disorder, and multiple prior episodes of osteomyelitis with recent complications who presented to [facility] 11/26/2012 with hypoglycemia with development of respiratory distress. He was found to have EF of 30-35% by echo thus we are consulted. In July 2014, he underwent surgery on his R foot with placement of antibiotic beads for osteomyelitis. He was admitted 8/5 with severe sepsis complicated by AKI, toxic metabolic encephalopathy with uremia, and possible surgical foot infection requiring I&D. He was discharged with plan for 6 weeks abx but returned 8/30 with confusion, fever, cough and was found to have HCAP, sepsis, and severe anemia with Hgb of 6 later felt due to AOCD + bone marrow suppression from sepsis. He was discharged on 11/25/12 with plan to complete PICC Vanc & oral Cipro but returned the following day on 11/26/12 with hypoglycemia and hypothermia. That day he had taken his insulin, ate his breakfast, got in bed to take [name] nap and complained of feeling hot. His family checked on him and found him drenched in sweat and disoriented. CBG was 48 and EMS was called - despite food and juice, CBG via EMS was 26 requiring glucagon and D50. CXR showed persistent opacities so antibiotic coverage was expanded. On 11/29/12 he developed acute decompensation with n/v, change in mental status, and hypoxemic respiratory failure requiring intubation ?aspiration event. He also had severe hypoalbuminemia 1.6, troponins neg x 3. D-dimer 1.95 with LE dopp neg for DVT. He was also treated with IV Lasix for 4 days which was stopped due to concern for getting dry with sinus tach. He self-extubated on 9/13 and is now satting normally on RA. As part of his eval, 2D echo was obtained showing mildly dilated LV, EF 30-35% with +WMA as below, grade 1 diastolic dysfunction, mild MR,  severely dilated LA, PA Pressure 68mmHg.   Since hospital d/c improving Still has [name] tenuous situation with his RLE in cast and non weight bearing.  Limited activity so no chest pain or dyspnea.  Seeing neuro for memory issues from previous left occipital infarct and lacunar infarcts.  Started on Namenda  02/11/13 non ischemic myovue but consistent with ischemic DCM Overall Impression: Intermediate risk stress nuclear study with primarily fixed, medium-sized basal to mid anterior and primarily fixed, small basal inferior perfusion defects. There does not appear to be significant ischemia (intermediate risk because of EF). Difficult study because of significant patient motion, therefore defects may be artifactual.   LV Ejection Fraction: 36%. LV Wall Motion: Global hypokinesis.   Echo  07/20/15  EF improved  Study Conclusions  - Left ventricle: The cavity size was normal. Wall thickness was  normal. Systolic function was mildly reduced. The estimated  ejection fraction was in the range of 45% to 50%. Regional wall  motion abnormalities cannot be excluded. Doppler parameters are  consistent with abnormal left ventricular relaxation (grade 1  diastolic dysfunction). The E/e&' ratio is <8, suggesting normal  LV filling pressure. - Aortic valve: Structurally normal valve. Trileaflet. - Mitral valve: Mildly thickened leaflets . There was trivial  regurgitation. - Left atrium: The atrium was normal in size. - Right ventricle: The cavity size was mildly dilated. Systolic  function is mildly reduced. - Right atrium: The atrium was normal in size. - Atrial septum: No defect or patent foramen ovale was identified. -  Inferior vena cava: The vessel was normal in size. The  respirophasic diameter changes were in the normal range (>= 50%),  consistent with normal central venous pressure.   03/15/13 Had right BKA   Doing well no chest pain or CHF  Working out at [employer] and ambulates well  with prosthetic leg  Hospitalized 06/2015 with syncope thought to be vasovagal negative w/u in hospital  Carotids plaque no stenosis   ROS: Denies fever, malais, weight loss, blurry vision, decreased visual acuity, cough, sputum, SOB, hemoptysis, pleuritic pain, palpitaitons, heartburn, abdominal pain, melena, lower extremity edema, claudication, or rash.  All other systems reviewed and negative  General: Affect appropriate Chronically ill black male HEENT: normal Neck supple with no adenopathy JVP normal left  bruits no thyromegaly Lungs clear with no wheezing and good diaphragmatic motion Heart:  S1/S2 no murmur, no rub, gallop or click PMI normal Abdomen: benighn, BS positve, no tenderness, no AAA no bruit.  No HSM or HJR Distal pulses intact to popliteal S/P left BKA  Right leg in boot with healing infection in foot No edema Neuro non-focal Skin warm and dry LE weakness    Current Outpatient Prescriptions  Medication Sig Dispense Refill  . aspirin 325 MG tablet Take 325 mg by mouth daily.    [name] atorvastatin (LIPITOR) 20 MG tablet Take 20 mg by mouth daily.    . carvedilol (COREG) 6.25 MG tablet Take 1 tablet (6.25 mg total) by mouth 2 (two) times daily with [name] meal. 60 tablet 0  . docusate sodium (COLACE) 100 MG capsule Take 1 capsule (100 mg total) by mouth 2 (two) times daily. 10 capsule 0  . hydrochlorothiazide (HYDRODIURIL) 50 MG tablet Take 50 mg by mouth daily.    [name] HYDROcodone-acetaminophen (NORCO/VICODIN) 5-325 MG tablet Take 1 tablet by mouth every 6 (six) hours as needed for moderate pain.    [name] insulin detemir (LEVEMIR) 100 UNIT/ML injection Inject 14 Units into the skin at bedtime.     . levETIRAcetam (KEPPRA) 500 MG tablet Take 1 tablet (500 mg total) by mouth 2 (two) times daily. 60 tablet 6  . memantine (NAMENDA) 10 MG tablet Take 1 tablet (10 mg total) by mouth 2 (two) times daily. 60 tablet 6  . Multiple Vitamins-Minerals (CENTRUM SILVER ADULT 50+ PO) Take 1 tablet  by mouth daily.     [name] PRESCRIPTION MEDICATION Take 1 capsule by mouth 2 (two) times daily. White capsule for neuropathy in fingers, sample meds from MD    . tamsulosin (FLOMAX) 0.4 MG CAPS capsule Take 1 capsule (0.4 mg total) by mouth daily after supper. 30 capsule 5  . timolol (BETIMOL) 0.5 % ophthalmic solution Place 1 drop into both eyes 2 (two) times daily.     . Travoprost, BAK Free, ([name]) 0.004 % SOLN ophthalmic solution Place 1 drop into both eyes at bedtime.    [name] venlafaxine XR (EFFEXOR-XR) 75 MG 24 hr capsule Take 75 mg by mouth daily.    . vitamin C (ASCORBIC ACID) 500 MG tablet Take 500 mg by mouth daily.     No current facility-administered medications for this visit.    Allergies  Codeine and Penicillins  Electrocardiogram:  12/06/13 SR rate 89 inferior T wave changes 07/14/14  SR rate 65  LVH otherwise normal  Assessment and Plan CAD:   Stable with no angina and good activity level.  Continue medical Rx CHF:  Euvolemic  EF improved 40-45% by recent echo    PVD:  F/u  vascular stump looks good with no ulcers and no longer seeing ID or on antibiotics Chol:  On statin  Cholesterol is at goal.  Continue current dose of statin and diet Rx.  No myalgias or side effects.  F/U  LFT's in 6 months. Lab Results  Component Value Date   LDLCALC 73 12/04/2012  Left Bruit:  Plaque no stenosis on duplex 07/20/15 reviewed.  ASA  Sycnope: likely vasovagal improved no evidence of hypoglycemia or recurrent seizure    F/u with me in 6 months   [name]

## 2015-08-02 DIAGNOSIS — G709 Myoneural disorder, unspecified: Secondary | ICD-10-CM | POA: Diagnosis not present

## 2015-08-02 DIAGNOSIS — Z8673 Personal history of transient ischemic attack (TIA), and cerebral infarction without residual deficits: Secondary | ICD-10-CM | POA: Diagnosis not present

## 2015-08-02 DIAGNOSIS — Z9181 History of falling: Secondary | ICD-10-CM | POA: Diagnosis not present

## 2015-08-02 DIAGNOSIS — I129 Hypertensive chronic kidney disease with stage 1 through stage 4 chronic kidney disease, or unspecified chronic kidney disease: Secondary | ICD-10-CM | POA: Diagnosis not present

## 2015-08-02 DIAGNOSIS — R569 Unspecified convulsions: Secondary | ICD-10-CM | POA: Diagnosis not present

## 2015-08-02 DIAGNOSIS — E1151 Type 2 diabetes mellitus with diabetic peripheral angiopathy without gangrene: Secondary | ICD-10-CM | POA: Diagnosis not present

## 2015-08-02 DIAGNOSIS — N182 Chronic kidney disease, stage 2 (mild): Secondary | ICD-10-CM | POA: Diagnosis not present

## 2015-08-02 DIAGNOSIS — Z89421 Acquired absence of other right toe(s): Secondary | ICD-10-CM | POA: Diagnosis not present

## 2015-08-02 DIAGNOSIS — E1122 Type 2 diabetes mellitus with diabetic chronic kidney disease: Secondary | ICD-10-CM | POA: Diagnosis not present

## 2015-08-02 DIAGNOSIS — Z8701 Personal history of pneumonia (recurrent): Secondary | ICD-10-CM | POA: Diagnosis not present

## 2015-08-02 DIAGNOSIS — W010XXD Fall on same level from slipping, tripping and stumbling without subsequent striking against object, subsequent encounter: Secondary | ICD-10-CM | POA: Diagnosis not present

## 2015-08-02 DIAGNOSIS — S72002D Fracture of unspecified part of neck of left femur, subsequent encounter for closed fracture with routine healing: Secondary | ICD-10-CM | POA: Diagnosis not present

## 2015-08-02 DIAGNOSIS — Z89522 Acquired absence of left knee: Secondary | ICD-10-CM | POA: Diagnosis not present

## 2015-08-04 DIAGNOSIS — Z8701 Personal history of pneumonia (recurrent): Secondary | ICD-10-CM | POA: Diagnosis not present

## 2015-08-04 DIAGNOSIS — Z9181 History of falling: Secondary | ICD-10-CM | POA: Diagnosis not present

## 2015-08-04 DIAGNOSIS — E1151 Type 2 diabetes mellitus with diabetic peripheral angiopathy without gangrene: Secondary | ICD-10-CM | POA: Diagnosis not present

## 2015-08-04 DIAGNOSIS — R569 Unspecified convulsions: Secondary | ICD-10-CM | POA: Diagnosis not present

## 2015-08-04 DIAGNOSIS — N182 Chronic kidney disease, stage 2 (mild): Secondary | ICD-10-CM | POA: Diagnosis not present

## 2015-08-04 DIAGNOSIS — Z89421 Acquired absence of other right toe(s): Secondary | ICD-10-CM | POA: Diagnosis not present

## 2015-08-04 DIAGNOSIS — S72002D Fracture of unspecified part of neck of left femur, subsequent encounter for closed fracture with routine healing: Secondary | ICD-10-CM | POA: Diagnosis not present

## 2015-08-04 DIAGNOSIS — E1122 Type 2 diabetes mellitus with diabetic chronic kidney disease: Secondary | ICD-10-CM | POA: Diagnosis not present

## 2015-08-04 DIAGNOSIS — W010XXD Fall on same level from slipping, tripping and stumbling without subsequent striking against object, subsequent encounter: Secondary | ICD-10-CM | POA: Diagnosis not present

## 2015-08-04 DIAGNOSIS — Z89522 Acquired absence of left knee: Secondary | ICD-10-CM | POA: Diagnosis not present

## 2015-08-04 DIAGNOSIS — Z8673 Personal history of transient ischemic attack (TIA), and cerebral infarction without residual deficits: Secondary | ICD-10-CM | POA: Diagnosis not present

## 2015-08-04 DIAGNOSIS — I129 Hypertensive chronic kidney disease with stage 1 through stage 4 chronic kidney disease, or unspecified chronic kidney disease: Secondary | ICD-10-CM | POA: Diagnosis not present

## 2015-08-04 DIAGNOSIS — G709 Myoneural disorder, unspecified: Secondary | ICD-10-CM | POA: Diagnosis not present

## 2015-08-18 DIAGNOSIS — E119 Type 2 diabetes mellitus without complications: Secondary | ICD-10-CM | POA: Diagnosis not present

## 2015-08-18 DIAGNOSIS — N183 Chronic kidney disease, stage 3 (moderate): Secondary | ICD-10-CM | POA: Diagnosis not present

## 2015-08-18 DIAGNOSIS — I129 Hypertensive chronic kidney disease with stage 1 through stage 4 chronic kidney disease, or unspecified chronic kidney disease: Secondary | ICD-10-CM | POA: Diagnosis not present

## 2015-08-18 DIAGNOSIS — E875 Hyperkalemia: Secondary | ICD-10-CM | POA: Diagnosis not present

## 2015-08-18 DIAGNOSIS — D649 Anemia, unspecified: Secondary | ICD-10-CM | POA: Diagnosis not present

## 2015-11-11 ENCOUNTER — Ambulatory Visit (INDEPENDENT_AMBULATORY_CARE_PROVIDER_SITE_OTHER): Payer: Medicare Other | Admitting: Neurology

## 2015-11-11 ENCOUNTER — Encounter: Payer: Self-pay | Admitting: Neurology

## 2015-11-11 VITALS — BP 149/77 | HR 68 | Ht 70.0 in | Wt 163.8 lb

## 2015-11-11 DIAGNOSIS — R413 Other amnesia: Secondary | ICD-10-CM | POA: Diagnosis not present

## 2015-11-11 DIAGNOSIS — G40909 Epilepsy, unspecified, not intractable, without status epilepticus: Secondary | ICD-10-CM | POA: Diagnosis not present

## 2015-11-11 MED ORDER — MEMANTINE HCL 10 MG PO TABS: 10.0000 mg | ORAL_TABLET | Freq: Two times a day (BID) | ORAL | 6 refills | Status: DC

## 2015-11-11 MED ORDER — LAMOTRIGINE 25 MG PO TABS: ORAL_TABLET | ORAL | 1 refills | Status: DC

## 2015-11-11 MED ORDER — LEVETIRACETAM 500 MG PO TABS: 500.0000 mg | ORAL_TABLET | Freq: Two times a day (BID) | ORAL | 2 refills | Status: DC
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
## 2015-11-11 NOTE — Patient Instructions (Signed)
   With the Lamictal (lamotrigine) 25 mg tablet, start one twice a day for 2 weeks, then take 2 tablets twice a day for 2 weeks, then take 3 tablets twice a day, and call our office after 2 weeks. We will eventually taper him off of the Keppra, but continue this medication for now.

## 2015-11-24 ENCOUNTER — Ambulatory Visit: Payer: Medicare Other | Admitting: Neurology

## 2015-12-01 DIAGNOSIS — H52203 Unspecified astigmatism, bilateral: Secondary | ICD-10-CM | POA: Diagnosis not present

## 2015-12-01 DIAGNOSIS — H401113 Primary open-angle glaucoma, right eye, severe stage: Secondary | ICD-10-CM | POA: Diagnosis not present

## 2015-12-01 DIAGNOSIS — E113592 Type 2 diabetes mellitus with proliferative diabetic retinopathy without macular edema, left eye: Secondary | ICD-10-CM | POA: Diagnosis not present

## 2015-12-01 DIAGNOSIS — H401123 Primary open-angle glaucoma, left eye, severe stage: Secondary | ICD-10-CM | POA: Diagnosis not present

## 2015-12-02 DIAGNOSIS — N400 Enlarged prostate without lower urinary tract symptoms: Secondary | ICD-10-CM | POA: Diagnosis not present

## 2015-12-02 DIAGNOSIS — I10 Essential (primary) hypertension: Secondary | ICD-10-CM | POA: Diagnosis not present

## 2015-12-02 DIAGNOSIS — Z0000 Encounter for general adult medical examination without abnormal findings: Secondary | ICD-10-CM | POA: Diagnosis not present

## 2015-12-02 DIAGNOSIS — Z125 Encounter for screening for malignant neoplasm of prostate: Secondary | ICD-10-CM | POA: Diagnosis not present

## 2015-12-02 DIAGNOSIS — E785 Hyperlipidemia, unspecified: Secondary | ICD-10-CM | POA: Diagnosis not present

## 2015-12-02 DIAGNOSIS — E118 Type 2 diabetes mellitus with unspecified complications: Secondary | ICD-10-CM | POA: Diagnosis not present

## 2015-12-02 DIAGNOSIS — E875 Hyperkalemia: Secondary | ICD-10-CM | POA: Diagnosis not present

## 2015-12-02 DIAGNOSIS — Z23 Encounter for immunization: Secondary | ICD-10-CM | POA: Diagnosis not present

## 2015-12-09 DIAGNOSIS — I10 Essential (primary) hypertension: Secondary | ICD-10-CM | POA: Diagnosis not present

## 2015-12-09 DIAGNOSIS — E118 Type 2 diabetes mellitus with unspecified complications: Secondary | ICD-10-CM | POA: Diagnosis not present

## 2015-12-09 DIAGNOSIS — G629 Polyneuropathy, unspecified: Secondary | ICD-10-CM | POA: Diagnosis not present

## 2015-12-22 ENCOUNTER — Telehealth: Payer: Self-pay | Admitting: Neurology

## 2015-12-22 NOTE — Telephone Encounter (Signed)
I called the wife. The patient is having some dizziness on Lamictal 75 bid. He is also on Keppra and Lyrica. We will taper off of keppra by 1/2 tablet (500 mg) each week until off, stay on Lamictal 75 bid. Call when off of the keppra.

## 2015-12-22 NOTE — Telephone Encounter (Signed)
Pt's wife called to advise that the pt is having some light-headedness since increasing the dose to 3 tabs 2 x/day. lamoTRIgine (LAMICTAL) 25 MG tablet . She said tomorrow will be 2 weeks for the 3 tabs. She is calling back as instructed by Dr Viona Gilmore.

## 2015-12-22 NOTE — Telephone Encounter (Signed)
[name] [phone] returned Dr. Tobey Grim call.

## 2015-12-22 NOTE — Telephone Encounter (Signed)
I called the patient, left a message, I will call back later. 

## 2016-01-11 MED ORDER — LAMOTRIGINE 100 MG PO TABS: 100.0000 mg | ORAL_TABLET | Freq: Two times a day (BID) | ORAL | 5 refills | Status: DC

## 2016-01-11 NOTE — Addendum Note (Signed)
Addended by: Margette Fast on: 01/11/2016 05:22 PM   Modules accepted: Orders

## 2016-01-11 NOTE — Telephone Encounter (Signed)
I called the patient, talk with the wife. The patient is tapering off of the Keppra, the patient will increase Lamictal to 100 mg twice daily. He is no longer complaining of dizziness on these medications.

## 2016-01-11 NOTE — Telephone Encounter (Signed)
Pt's wife called to advise the pt will take the last dose of keppra tomorrow. She wants to know what is the next step

## 2016-02-02 DIAGNOSIS — E875 Hyperkalemia: Secondary | ICD-10-CM | POA: Diagnosis not present

## 2016-02-02 DIAGNOSIS — E119 Type 2 diabetes mellitus without complications: Secondary | ICD-10-CM | POA: Diagnosis not present

## 2016-02-02 DIAGNOSIS — D649 Anemia, unspecified: Secondary | ICD-10-CM | POA: Diagnosis not present

## 2016-02-02 DIAGNOSIS — N183 Chronic kidney disease, stage 3 (moderate): Secondary | ICD-10-CM | POA: Diagnosis not present

## 2016-02-02 DIAGNOSIS — I129 Hypertensive chronic kidney disease with stage 1 through stage 4 chronic kidney disease, or unspecified chronic kidney disease: Secondary | ICD-10-CM | POA: Diagnosis not present

## 2016-02-09 DIAGNOSIS — I10 Essential (primary) hypertension: Secondary | ICD-10-CM | POA: Diagnosis not present

## 2016-02-24 NOTE — Progress Notes (Signed)
Patient ID: [name], male   DOB: [birth year], 70 y.o.   MRN: [id number]  [name] is a 70 y.o. M with history of DM, CKD stage II, HTN, seizure disorder, and multiple prior episodes of osteomyelitis with   complications including left BKA  Had fall with left femoral neck fracture 06/02/15  Hospitalized 06/2015 with syncope thought to be vasovagal negative w/u in hospital  Carotids plaque no stenosis   Seeing neuro for memory issues from previous left occipital infarct and lacunar infarcts.  Started on Namenda  02/11/13 non ischemic myovue but consistent with ischemic DCM Overall Impression: Intermediate risk stress nuclear study with primarily fixed, medium-sized basal to mid anterior and primarily fixed, small basal inferior perfusion defects. There does not appear to be significant ischemia (intermediate risk because of EF). Difficult study because of significant patient motion, therefore defects may be artifactual.   LV Ejection Fraction: 36%. LV Wall Motion: Global hypokinesis.   Echo  07/20/15  EF improved  Study Conclusions  - Left ventricle: The cavity size was normal. Wall thickness was  normal. Systolic function was mildly reduced. The estimated  ejection fraction was in the range of 45% to 50%. Regional wall  motion abnormalities cannot be excluded. Doppler parameters are  consistent with abnormal left ventricular relaxation (grade 1  diastolic dysfunction). The E/e&' ratio is <8, suggesting normal  LV filling pressure. - Aortic valve: Structurally normal valve. Trileaflet. - Mitral valve: Mildly thickened leaflets . There was trivial  regurgitation. - Left atrium: The atrium was normal in size. - Right ventricle: The cavity size was mildly dilated. Systolic  function is mildly reduced. - Right atrium: The atrium was normal in size. - Atrial septum: No defect or patent foramen ovale was identified. - Inferior vena cava: The vessel was normal in size. The   respirophasic diameter changes were in the normal range (>= 50%),  consistent with normal central venous pressure.   03/15/13 Had left  BKA   Hospitalized 06/2015 with syncope thought to be vasovagal negative w/u in hospital  Carotids plaque no stenosis   Doing well no chest pain or CHF  Lots of non cardiac concerns Balance worse and difficulty walking Right thenar atrophy with difficulty writing BP up today   ROS: Denies fever, malais, weight loss, blurry vision, decreased visual acuity, cough, sputum, SOB, hemoptysis, pleuritic pain, palpitaitons, heartburn, abdominal pain, melena, lower extremity edema, claudication, or rash.  All other systems reviewed and negative  General: Affect appropriate Chronically ill black male HEENT: normal Neck supple with no adenopathy JVP normal left  bruits no thyromegaly Lungs clear with no wheezing and good diaphragmatic motion Heart:  S1/S2 no murmur, no rub, gallop or click PMI normal Abdomen: benighn, BS positve, no tenderness, no AAA no bruit.  No HSM or HJR Distal pulses intact to popliteal S/P left BKA  Right leg in boot with healing infection in foot No edema Neuro right hand weak with thenar atrophy  Skin warm and dry LE weakness    Current Outpatient Prescriptions  Medication Sig Dispense Refill  . aspirin 325 MG tablet Take 325 mg by mouth daily.    [name] atorvastatin (LIPITOR) 20 MG tablet Take 20 mg by mouth daily.    . carvedilol (COREG) 6.25 MG tablet Take 1 tablet (6.25 mg total) by mouth 2 (two) times daily with a meal. 60 tablet 0  . docusate sodium (COLACE) 100 MG capsule Take 1 capsule (100 mg total) by mouth 2 (two) times  daily. 10 capsule 0  . hydrochlorothiazide (HYDRODIURIL) 50 MG tablet Take 50 mg by mouth daily.    [name] HYDROcodone-acetaminophen (NORCO/VICODIN) 5-325 MG tablet Take 1 tablet by mouth every 6 (six) hours as needed for moderate pain.    [name] insulin detemir (LEVEMIR) 100 UNIT/ML injection Inject 14 Units into the  skin at bedtime.     . lamoTRIgine (LAMICTAL) 100 MG tablet Take 1 tablet (100 mg total) by mouth 2 (two) times daily. 60 tablet 5  . lamoTRIgine (LAMICTAL) 25 MG tablet Take 100 mg by mouth 2 (two) times daily.    [name] levETIRAcetam (KEPPRA) 500 MG tablet Take 1 tablet (500 mg total) by mouth 2 (two) times daily. 60 tablet 2  . LYRICA 75 MG capsule     . memantine (NAMENDA) 10 MG tablet Take 1 tablet (10 mg total) by mouth 2 (two) times daily. 60 tablet 6  . Multiple Vitamins-Minerals (CENTRUM SILVER ADULT 50+ PO) Take 1 tablet by mouth daily.     . ONE TOUCH ULTRA TEST test strip     . PRESCRIPTION MEDICATION Take 1 capsule by mouth 2 (two) times daily. White capsule for neuropathy in fingers, sample meds from MD    . silver sulfADIAZINE (SILVADENE) 1 % cream     . tamsulosin (FLOMAX) 0.4 MG CAPS capsule Take 1 capsule (0.4 mg total) by mouth daily after supper. 30 capsule 5  . timolol (BETIMOL) 0.5 % ophthalmic solution Place 1 drop into both eyes 2 (two) times daily.     . Travoprost, BAK Free, ([name]) 0.004 % SOLN ophthalmic solution Place 1 drop into both eyes at bedtime.    [name] venlafaxine XR (EFFEXOR-XR) 75 MG 24 hr capsule Take 75 mg by mouth daily.    . vitamin C (ASCORBIC ACID) 500 MG tablet Take 500 mg by mouth daily.     No current facility-administered medications for this visit.     Allergies  Codeine and Penicillins  Electrocardiogram:  12/06/13 SR rate 89 inferior T wave changes 07/14/14  SR rate 65  LVH otherwise normal  Assessment and Plan CAD:   Stable with no angina and good activity level.  Continue medical Rx CHF:  Euvolemic  EF improved 40-45% by recent echo  Start bidil as Cr over 2   PVD:  F/u vascular stump looks good   Chol:  On statin  Cholesterol is at goal.  Continue current dose of statin and diet Rx.  No myalgias or side effects.  F/U  LFT's in 6 months. Lab Results  Component Value Date   LDLCALC 73 12/04/2012  Left Bruit:  Plaque no stenosis on duplex  07/20/15 reviewed.  ASA  Sycnope: likely vasovagal improved no evidence of hypoglycemia or recurrent seizure  CRF: baseline Cr around 2.2  Neuro:  Needs to f/u with Dr Jannifer Franklin would benefit from PT/OT gait and right hand weakness    F/u with me in 6 months   [name]

## 2016-03-02 ENCOUNTER — Ambulatory Visit (INDEPENDENT_AMBULATORY_CARE_PROVIDER_SITE_OTHER): Payer: Medicare Other | Admitting: Cardiovascular Disease

## 2016-03-02 ENCOUNTER — Encounter: Payer: Self-pay | Admitting: Cardiovascular Disease

## 2016-03-02 VITALS — BP 166/70 | HR 72 | Ht 70.0 in | Wt 166.4 lb

## 2016-03-02 DIAGNOSIS — I2583 Coronary atherosclerosis due to lipid rich plaque: Secondary | ICD-10-CM

## 2016-03-02 DIAGNOSIS — I2510 Atherosclerotic heart disease of native coronary artery without angina pectoris: Secondary | ICD-10-CM

## 2016-03-02 MED ORDER — ISOSORB DINITRATE-HYDRALAZINE 20-37.5 MG PO TABS: 1.0000 | ORAL_TABLET | Freq: Two times a day (BID) | ORAL | 3 refills | Status: DC

## 2016-03-02 NOTE — Patient Instructions (Addendum)
Medication Instructions:  Your physician has recommended you make the following change in your medication:  1-START Bidil 20 - 37.5 mg by mouth twice daily.  Labwork: NONE  Testing/Procedures: NONE  Follow-Up: Your physician wants you to follow-up in: 6 months with Dr. Johnsie Cancel. You will receive a reminder letter in the mail two months in advance. If you don't receive a letter, please call our office to schedule the follow-up appointment.   If you need a refill on your cardiac medications before your next appointment, please call your pharmacy.

## 2016-03-03 ENCOUNTER — Telehealth: Payer: Self-pay | Admitting: Cardiovascular Disease

## 2016-03-03 NOTE — Telephone Encounter (Signed)
Patient needs assistance with cost on his Bidil. Will forward to [place] to see if there is anything we can do.

## 2016-03-03 NOTE — Telephone Encounter (Signed)
Pt's wife called and has a question about pt Medication and the price.   ISOSORBIDE-HYERALAZINE    New med BIDIL 20-37.5mg    Please give her a call back to help

## 2016-03-06 ENCOUNTER — Other Ambulatory Visit: Payer: Self-pay

## 2016-03-06 DIAGNOSIS — I2583 Coronary atherosclerosis due to lipid rich plaque: Principal | ICD-10-CM

## 2016-03-06 DIAGNOSIS — I2510 Atherosclerotic heart disease of native coronary artery without angina pectoris: Secondary | ICD-10-CM

## 2016-03-06 MED ORDER — ISOSORB DINITRATE-HYDRALAZINE 20-37.5 MG PO TABS: 1.0000 | ORAL_TABLET | Freq: Two times a day (BID) | ORAL | 11 refills | Status: DC

## 2016-03-06 NOTE — Telephone Encounter (Signed)
Prior auth not required for Bidil. 90 day supply is too expensive for patient. I have re ordered it for 30 days, which will cost them around $70.00.

## 2016-03-06 NOTE — Telephone Encounter (Signed)
Pt c/o medication issue:  1. Name of Medication: Carvedilol 6.25 mg   2. How are you currently taking this medication (dosage and times per day)?1 tablet   3. Are you having a reaction (difficulty breathing--STAT)? No sob   4. What is your medication issue? Bp is high was 155 earlier now over 160, back of neck hurts and dizziness

## 2016-03-09 ENCOUNTER — Telehealth: Payer: Self-pay | Admitting: Cardiovascular Disease

## 2016-03-09 NOTE — Telephone Encounter (Signed)
Patient's wife is referring to [place]. She stated that this medication is on back order and Dr. Maudie Mercury with patient's PCP office prescribed something for patient's BP. Patient's wife cannot remember the name of medication that Dr. Maudie Mercury prescribed. Patient's wife stated she would give our office a call with the name. Informed patient's wife if patient's BP is kept low with new medication, then he should be fine until the pharmacy gets Bidil in stock. Patient's wife stated that patient's BP has been better. Patient's wife will call back with medication, so our office can update patient's medication list.

## 2016-03-09 NOTE — Telephone Encounter (Signed)
Left message for patient to call back  

## 2016-03-09 NOTE — Telephone Encounter (Signed)
New message  Pt's wife is calling, states that new BP medication for pt is on backorder  Is there a different medication that can be substituted  Wife did not know the name of medication

## 2016-03-31 ENCOUNTER — Other Ambulatory Visit: Payer: Self-pay | Admitting: Internal Medicine

## 2016-03-31 DIAGNOSIS — I639 Cerebral infarction, unspecified: Secondary | ICD-10-CM

## 2016-03-31 DIAGNOSIS — Z0000 Encounter for general adult medical examination without abnormal findings: Secondary | ICD-10-CM | POA: Diagnosis not present

## 2016-03-31 DIAGNOSIS — R51 Headache: Secondary | ICD-10-CM | POA: Diagnosis not present

## 2016-03-31 DIAGNOSIS — R2681 Unsteadiness on feet: Secondary | ICD-10-CM | POA: Diagnosis not present

## 2016-04-04 DIAGNOSIS — E7800 Pure hypercholesterolemia, unspecified: Secondary | ICD-10-CM | POA: Diagnosis not present

## 2016-04-04 DIAGNOSIS — E1122 Type 2 diabetes mellitus with diabetic chronic kidney disease: Secondary | ICD-10-CM | POA: Diagnosis not present

## 2016-04-07 DIAGNOSIS — E1122 Type 2 diabetes mellitus with diabetic chronic kidney disease: Secondary | ICD-10-CM | POA: Diagnosis not present

## 2016-04-07 DIAGNOSIS — E785 Hyperlipidemia, unspecified: Secondary | ICD-10-CM | POA: Diagnosis not present

## 2016-04-07 DIAGNOSIS — Z8673 Personal history of transient ischemic attack (TIA), and cerebral infarction without residual deficits: Secondary | ICD-10-CM | POA: Diagnosis not present

## 2016-04-07 DIAGNOSIS — Z7982 Long term (current) use of aspirin: Secondary | ICD-10-CM | POA: Diagnosis not present

## 2016-04-07 DIAGNOSIS — R2681 Unsteadiness on feet: Secondary | ICD-10-CM | POA: Diagnosis not present

## 2016-04-07 DIAGNOSIS — Z87440 Personal history of urinary (tract) infections: Secondary | ICD-10-CM | POA: Diagnosis not present

## 2016-04-07 DIAGNOSIS — I509 Heart failure, unspecified: Secondary | ICD-10-CM | POA: Diagnosis not present

## 2016-04-07 DIAGNOSIS — N189 Chronic kidney disease, unspecified: Secondary | ICD-10-CM | POA: Diagnosis not present

## 2016-04-07 DIAGNOSIS — Z794 Long term (current) use of insulin: Secondary | ICD-10-CM | POA: Diagnosis not present

## 2016-04-07 DIAGNOSIS — N400 Enlarged prostate without lower urinary tract symptoms: Secondary | ICD-10-CM | POA: Diagnosis not present

## 2016-04-07 DIAGNOSIS — I130 Hypertensive heart and chronic kidney disease with heart failure and stage 1 through stage 4 chronic kidney disease, or unspecified chronic kidney disease: Secondary | ICD-10-CM | POA: Diagnosis not present

## 2016-04-07 DIAGNOSIS — E1140 Type 2 diabetes mellitus with diabetic neuropathy, unspecified: Secondary | ICD-10-CM | POA: Diagnosis not present

## 2016-04-10 ENCOUNTER — Ambulatory Visit
Admission: RE | Admit: 2016-04-10 | Discharge: 2016-04-10 | Disposition: A | Discharge status: Home / Self Care | Payer: Medicare Other | Source: Ambulatory Visit | Attending: Internal Medicine | Admitting: Internal Medicine

## 2016-04-10 DIAGNOSIS — R42 Dizziness and giddiness: Secondary | ICD-10-CM | POA: Diagnosis not present

## 2016-04-10 DIAGNOSIS — I639 Cerebral infarction, unspecified: Secondary | ICD-10-CM

## 2016-04-11 DIAGNOSIS — I10 Essential (primary) hypertension: Secondary | ICD-10-CM | POA: Diagnosis not present

## 2016-04-11 DIAGNOSIS — E118 Type 2 diabetes mellitus with unspecified complications: Secondary | ICD-10-CM | POA: Diagnosis not present

## 2016-04-12 ENCOUNTER — Telehealth: Payer: Self-pay | Admitting: Neurology

## 2016-04-12 NOTE — Telephone Encounter (Signed)
The patient had a recent MRI the brain 22nd of January 2018. They're reviewed the study shows extensive chronic white matter changes including the brainstem, and an old left posterior cerebral artery distribution stroke. Patient is also have blood work done recently that includes a comprehensive metabolic profile, liver profile is unremarkable, BUN 51, and creatinine is 2.63. Total protein 6.7, sodium 144, potassium 4.1, chloride 104, CO2 25, calcium 9.1. Hemoglobin A1c of 6.5. White blood count 5.6, hemoglobin of 11.1, hematocrit 35.1, platelets of 166, PSA of 4.3, TSH 1.12.  MRI of the brain was done for dizziness and gait disturbance, the extensive white matter changes including the brainstem likely accounts for some of the dizziness and balance issues. The deficit and symptoms began several months prior to the MRI study.

## 2016-04-13 ENCOUNTER — Telehealth: Payer: Self-pay | Admitting: Cardiovascular Disease

## 2016-04-13 NOTE — Telephone Encounter (Signed)
Ms. Reim is calling with some concerns about a prescription, did not know the name of the medication. Please call, thanks.

## 2016-04-14 DIAGNOSIS — H5340 Unspecified visual field defects: Secondary | ICD-10-CM | POA: Diagnosis not present

## 2016-04-14 DIAGNOSIS — H401133 Primary open-angle glaucoma, bilateral, severe stage: Secondary | ICD-10-CM | POA: Diagnosis not present

## 2016-04-14 NOTE — Telephone Encounter (Signed)
Patient's wife is calling to let our office know that patient's medication is on back order. Patient's wife is not sure which  medication it is, but she will call our office back with the name of the medication.

## 2016-04-14 NOTE — Telephone Encounter (Signed)
Ms. [name] returning is your call, thanks.

## 2016-04-14 NOTE — Telephone Encounter (Signed)
Left message for patient to call back  

## 2016-04-27 ENCOUNTER — Telehealth: Payer: Self-pay | Admitting: Cardiovascular Disease

## 2016-04-27 NOTE — Telephone Encounter (Signed)
Called patient's wife back. Patient's BP is now 148/64. Informed patient's wife to check BP before medications are given, and she can also space medications out to keep BP from dropping too low. Patient 's wife stated she gave patient amlodipine this am and patient took Bidil in the afternoon. Informed patient's wife that he should not be taking amlodipine. Patient stated she thought he could start his new medication today. Informed patient's wife that patient's should only be taking Bidil not amlodipine. Informed patient's wife that BP is better and to keep a record of BPs and to call our office if it drops again. Patient's wife verbalized understanding.

## 2016-04-27 NOTE — Telephone Encounter (Signed)
New Message   Requesting call back   Pt c/o BP issue: STAT if pt c/o blurred vision, one-sided weakness or slurred speech  1. What are your last 5 BP readings? Hour ago 90/48, just now 109/54  2. Are you having any other symptoms (ex. Dizziness, headache, blurred vision, passed out)? Dizziness  3. What is your BP issue? Pt wife wants to know how far it needs to go down

## 2016-04-27 NOTE — Telephone Encounter (Signed)
Patient's wife called back to let our office know that patient just got his Bidil. Patient's wife stated that patient's PCP prescribed Norvasc since Bidil was back ordered. Patient's wife wanted to know if patient needed to take both. Informed patient's wife that patient should stop norvasc and just take Bidil. Consulted Dr. Johnsie Cancel and he is in agreement. Patient's wife verbalized understanding.

## 2016-05-08 ENCOUNTER — Telehealth: Payer: Self-pay | Admitting: Cardiovascular Disease

## 2016-05-08 ENCOUNTER — Encounter: Payer: Self-pay | Admitting: Physician Assistant

## 2016-05-08 NOTE — Telephone Encounter (Signed)
New Message  Pt wife call requesting to speak with RN. Pt is c/o of feeling fluid around his heart. Please call back to discuss

## 2016-05-08 NOTE — Telephone Encounter (Signed)
Patient's wife calling about patient having a rough morning. Patient stated he woke-up feeling SOB and tired. Patient stated he feels dizzy at times, with BP today of 164/73.Patient stated he felt better once he was sitting up. Patient has also had some swelling in his RLE. Patient stated it goes down when he elevates his right leg over night. Patient is weighing daily, but no fluctuation from day to day. Patient has had several medication changes recently, and patient wanted to be seen. Put patient on the PA's schedule for tomorrow, so he can be evaluated in person. Informed patient if his symptoms get worse to go to urgent care or ED. Patient verbalized understanding.

## 2016-05-09 ENCOUNTER — Encounter: Payer: Self-pay | Admitting: Physician Assistant

## 2016-05-09 ENCOUNTER — Ambulatory Visit (INDEPENDENT_AMBULATORY_CARE_PROVIDER_SITE_OTHER): Payer: Medicare Other | Admitting: Physician Assistant

## 2016-05-09 VITALS — BP 150/70 | HR 62 | Ht 70.0 in | Wt 169.1 lb

## 2016-05-09 DIAGNOSIS — I5042 Chronic combined systolic (congestive) and diastolic (congestive) heart failure: Secondary | ICD-10-CM

## 2016-05-09 DIAGNOSIS — Z79899 Other long term (current) drug therapy: Secondary | ICD-10-CM

## 2016-05-09 DIAGNOSIS — R0602 Shortness of breath: Secondary | ICD-10-CM

## 2016-05-09 DIAGNOSIS — I10 Essential (primary) hypertension: Secondary | ICD-10-CM

## 2016-05-09 DIAGNOSIS — R600 Localized edema: Secondary | ICD-10-CM | POA: Diagnosis not present

## 2016-05-09 LAB — BASIC METABOLIC PANEL
BUN / CREAT RATIO: 18 (ref 10–24)
BUN: 46 mg/dL — AB (ref 8–27)
CALCIUM: 9.1 mg/dL (ref 8.6–10.2)
CHLORIDE: 103 mmol/L (ref 96–106)
CO2: 24 mmol/L (ref 18–29)
Creatinine, Ser: 2.57 mg/dL — ABNORMAL HIGH (ref 0.76–1.27)
GFR calc Af Amer: 28 — ABNORMAL LOW (ref >59)
GFR calc non Af Amer: 24 — ABNORMAL LOW (ref >59)
GLUCOSE: 94 mg/dL (ref 65–99)
Potassium: 4.8 mmol/L (ref 3.5–5.2)
Sodium: 143 mmol/L (ref 134–144)

## 2016-05-09 MED ORDER — AMLODIPINE BESYLATE 2.5 MG PO TABS: 2.5000 mg | ORAL_TABLET | Freq: Every day | ORAL | 3 refills | Status: DC

## 2016-05-09 NOTE — Patient Instructions (Signed)
Medication Instructions:  START- Amlodipine 2.5 mg daily  Labwork: BNP and BMP Today  Testing/Procedures: None Ordered  Follow-Up: Your physician recommends that you schedule a follow-up appointment in: Pending Lab Work   Any Other Special Instructions Will Be Listed Below (If Applicable).   If you need a refill on your cardiac medications before your next appointment, please call your pharmacy.
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
## 2016-05-10 ENCOUNTER — Telehealth: Payer: Self-pay | Admitting: Physician Assistant

## 2016-05-10 DIAGNOSIS — R7989 Other specified abnormal findings of blood chemistry: Secondary | ICD-10-CM

## 2016-05-10 MED ORDER — POTASSIUM CHLORIDE ER 10 MEQ PO TBCR: EXTENDED_RELEASE_TABLET | ORAL | 0 refills | Status: DC

## 2016-05-10 MED ORDER — FUROSEMIDE 40 MG PO TABS: ORAL_TABLET | ORAL | 0 refills | Status: DC

## 2016-05-10 NOTE — Telephone Encounter (Signed)
Returned pts wifes call, DPR on file, and have advised her that we are still waiting on 1 of the labs to result, but in the meantime, Vin, PA-C, wanted the following changes made: 1.  HOLD HCTZ 2.  START LASIX 40 MG QD X'S 5 DAYS THEN STOP 3.  START K+ 10 MEQ QD X'S 5 DAYS THEN STOP 4.  REPEAT BMET 05/15/16.  Pts wife verbalized understanding. rx's sent to [facility], per request.  Wife verbalized appreciation for the call.

## 2016-05-10 NOTE — Telephone Encounter (Signed)
Follow Up:    Wife wants to know if pt's lab results are back from yesterday. She says she needs these results asap,pt is having problem urinating.

## 2016-05-11 ENCOUNTER — Telehealth: Payer: Self-pay | Admitting: Cardiovascular Disease

## 2016-05-11 NOTE — Telephone Encounter (Signed)
Returned pts wifes call, DPR on file, and she has been advised that Amlodipine was not 1 of the medications that we stopped.  He was advised to d/c the Lasix and K+ and to continue to hold the Spironolactone.  She was very appreciative for the call back.

## 2016-05-11 NOTE — Telephone Encounter (Signed)
New message    Pt wife would like to know if amLODipine (NORVASC) 2.5 MG tablet was discontinued along with the other medications you spoke with her about. Pt requests a call back from (specifically requested) Jeanann Lewandowsky

## 2016-05-12 LAB — PRO B NATRIURETIC PEPTIDE: NT-PRO BNP: 123 pg/mL (ref 0–376)

## 2016-05-12 LAB — SPECIMEN STATUS REPORT

## 2016-05-15 ENCOUNTER — Ambulatory Visit (INDEPENDENT_AMBULATORY_CARE_PROVIDER_SITE_OTHER): Payer: Medicare Other | Admitting: Neurology

## 2016-05-15 ENCOUNTER — Ambulatory Visit: Payer: Medicare Other | Admitting: Adult Health

## 2016-05-15 ENCOUNTER — Other Ambulatory Visit: Payer: Medicare Other

## 2016-05-15 ENCOUNTER — Encounter: Payer: Self-pay | Admitting: Neurology

## 2016-05-15 VITALS — BP 169/70 | HR 63 | Ht 70.0 in | Wt 171.0 lb

## 2016-05-15 DIAGNOSIS — Z5181 Encounter for therapeutic drug level monitoring: Secondary | ICD-10-CM | POA: Diagnosis not present

## 2016-05-15 DIAGNOSIS — G40909 Epilepsy, unspecified, not intractable, without status epilepticus: Secondary | ICD-10-CM

## 2016-05-15 DIAGNOSIS — R413 Other amnesia: Secondary | ICD-10-CM | POA: Diagnosis not present

## 2016-05-15 MED ORDER — MEMANTINE HCL 10 MG PO TABS: 10.0000 mg | ORAL_TABLET | Freq: Two times a day (BID) | ORAL | 3 refills | Status: DC
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
## 2016-05-16 LAB — COMPREHENSIVE METABOLIC PANEL
ALT: 22 IU/L (ref 0–44)
AST: 24 IU/L (ref 0–40)
Albumin/Globulin Ratio: 1.4 (ref 1.2–2.2)
Albumin: 3.9 g/dL (ref 3.6–4.8)
Alkaline Phosphatase: 92 IU/L (ref 39–117)
BUN/Creatinine Ratio: 18 (ref 10–24)
BUN: 48 mg/dL — ABNORMAL HIGH (ref 8–27)
Bilirubin Total: 0.3 mg/dL (ref 0.0–1.2)
CALCIUM: 9 mg/dL (ref 8.6–10.2)
CO2: 24 mmol/L (ref 18–29)
CREATININE: 2.63 mg/dL — AB (ref 0.76–1.27)
Chloride: 101 mmol/L (ref 96–106)
GFR calc Af Amer: 27 mL/min/{1.73_m2} — ABNORMAL LOW (ref >59)
GFR, EST NON AFRICAN AMERICAN: 24 mL/min/{1.73_m2} — AB (ref >59)
Globulin, Total: 2.8 g/dL (ref 1.5–4.5)
Glucose: 231 mg/dL — ABNORMAL HIGH (ref 65–99)
POTASSIUM: 5 mmol/L (ref 3.5–5.2)
Sodium: 139 mmol/L (ref 134–144)
Total Protein: 6.7 g/dL (ref 6.0–8.5)

## 2016-05-16 LAB — CBC WITH DIFFERENTIAL/PLATELET
BASOS: 0 %
Basophils Absolute: 0 10*3/uL (ref 0.0–0.2)
EOS (ABSOLUTE): 0.1 10*3/uL (ref 0.0–0.4)
EOS: 2 %
Hematocrit: 29.9 % — ABNORMAL LOW (ref 37.5–51.0)
Hemoglobin: 9.5 g/dL — ABNORMAL LOW (ref 13.0–17.7)
IMMATURE GRANS (ABS): 0 10*3/uL (ref 0.0–0.1)
IMMATURE GRANULOCYTES: 0 %
LYMPHS: 10 %
Lymphocytes Absolute: 0.7 10*3/uL (ref 0.7–3.1)
MCH: 28.2 pg (ref 26.6–33.0)
MCHC: 31.8 g/dL (ref 31.5–35.7)
MCV: 89 fL (ref 79–97)
MONOS ABS: 0.5 10*3/uL (ref 0.1–0.9)
Monocytes: 7 %
NEUTROS PCT: 81 %
Neutrophils Absolute: 5.6 10*3/uL (ref 1.4–7.0)
PLATELETS: 182 10*3/uL (ref 150–379)
RBC: 3.37 x10E6/uL — AB (ref 4.14–5.80)
RDW: 13.3 % (ref 12.3–15.4)
WBC: 7 10*3/uL (ref 3.4–10.8)

## 2016-05-16 LAB — LAMOTRIGINE LEVEL: LAMOTRIGINE LVL: 5.5 ug/mL (ref 2.0–20.0)

## 2016-05-17 DIAGNOSIS — N184 Chronic kidney disease, stage 4 (severe): Secondary | ICD-10-CM | POA: Diagnosis not present

## 2016-05-17 DIAGNOSIS — I129 Hypertensive chronic kidney disease with stage 1 through stage 4 chronic kidney disease, or unspecified chronic kidney disease: Secondary | ICD-10-CM | POA: Diagnosis not present

## 2016-05-17 DIAGNOSIS — E1122 Type 2 diabetes mellitus with diabetic chronic kidney disease: Secondary | ICD-10-CM | POA: Diagnosis not present

## 2016-05-17 DIAGNOSIS — D649 Anemia, unspecified: Secondary | ICD-10-CM | POA: Diagnosis not present

## 2016-05-17 DIAGNOSIS — E119 Type 2 diabetes mellitus without complications: Secondary | ICD-10-CM | POA: Diagnosis not present

## 2016-05-31 ENCOUNTER — Encounter (HOSPITAL_COMMUNITY): Payer: Medicare Other

## 2016-05-31 ENCOUNTER — Ambulatory Visit (HOSPITAL_COMMUNITY)
Admission: RE | Admit: 2016-05-31 | Discharge: 2016-05-31 | Disposition: A | Discharge status: Home / Self Care | Payer: Medicare Other | Source: Ambulatory Visit | Attending: Nephrology | Admitting: Nephrology

## 2016-05-31 DIAGNOSIS — N184 Chronic kidney disease, stage 4 (severe): Secondary | ICD-10-CM | POA: Insufficient documentation

## 2016-05-31 DIAGNOSIS — D631 Anemia in chronic kidney disease: Secondary | ICD-10-CM | POA: Insufficient documentation

## 2016-05-31 LAB — POCT HEMOGLOBIN-HEMACUE: HEMOGLOBIN: 10.1 g/dL — AB (ref 13.0–17.0)

## 2016-05-31 MED ORDER — DARBEPOETIN ALFA 100 MCG/0.5ML IJ SOSY
100.0000 ug | PREFILLED_SYRINGE | INTRAMUSCULAR | Status: DC
  Administered 2016-05-31: 100 ug via SUBCUTANEOUS

## 2016-05-31 MED ORDER — DARBEPOETIN ALFA 100 MCG/0.5ML IJ SOSY
PREFILLED_SYRINGE | INTRAMUSCULAR | Status: AC
  Filled 2016-05-31: qty 0.5

## 2016-05-31 NOTE — Discharge Instructions (Signed)

## 2016-06-06 DIAGNOSIS — E118 Type 2 diabetes mellitus with unspecified complications: Secondary | ICD-10-CM | POA: Diagnosis not present

## 2016-06-06 DIAGNOSIS — I10 Essential (primary) hypertension: Secondary | ICD-10-CM | POA: Diagnosis not present

## 2016-06-13 DIAGNOSIS — E118 Type 2 diabetes mellitus with unspecified complications: Secondary | ICD-10-CM | POA: Diagnosis not present

## 2016-06-13 DIAGNOSIS — I10 Essential (primary) hypertension: Secondary | ICD-10-CM | POA: Diagnosis not present

## 2016-06-13 DIAGNOSIS — G629 Polyneuropathy, unspecified: Secondary | ICD-10-CM | POA: Diagnosis not present

## 2016-06-19 DIAGNOSIS — D649 Anemia, unspecified: Secondary | ICD-10-CM | POA: Diagnosis not present

## 2016-06-19 DIAGNOSIS — N184 Chronic kidney disease, stage 4 (severe): Secondary | ICD-10-CM | POA: Diagnosis not present

## 2016-06-19 DIAGNOSIS — I129 Hypertensive chronic kidney disease with stage 1 through stage 4 chronic kidney disease, or unspecified chronic kidney disease: Secondary | ICD-10-CM | POA: Diagnosis not present

## 2016-06-19 DIAGNOSIS — E1122 Type 2 diabetes mellitus with diabetic chronic kidney disease: Secondary | ICD-10-CM | POA: Diagnosis not present

## 2016-06-19 DIAGNOSIS — N2581 Secondary hyperparathyroidism of renal origin: Secondary | ICD-10-CM | POA: Diagnosis not present

## 2016-06-21 ENCOUNTER — Other Ambulatory Visit: Payer: Self-pay | Admitting: Surgery

## 2016-06-21 DIAGNOSIS — N184 Chronic kidney disease, stage 4 (severe): Secondary | ICD-10-CM

## 2016-06-26 ENCOUNTER — Telehealth: Payer: Self-pay | Admitting: Neurology

## 2016-06-26 NOTE — Telephone Encounter (Signed)
I agree with nursing assessment, I did not call the patient.

## 2016-06-26 NOTE — Telephone Encounter (Signed)
Called and spoke with wife. She stated they went to the [facility] to work out earlier and he experienced neck pain located on back of neck. He also was sweating more than normal. She was not sure if he lifted too heavy of weights. Symptoms have since resolved. He drank some water, has taken a nap and eaten since he went to the [facility]. He is feeling fine now.   I went over common signs/symptoms of stroke. Pt/wife denied patient having trouble walking, vision problems, trouble speaking, any numbness or tingling, facial droop, or weakness, trouble swallowing. Advised them if he does develop any sx like this to go to ER asap to be evaluated.    If neck pain comes back, I advised him to f/u with PCP to be evaluated. Encouraged them to call if they have further questions or concerns. She verbalized understanding.

## 2016-06-26 NOTE — Telephone Encounter (Signed)
Pt wife called with concern about [facility] and [facility] complained about neck pain and while on a certain machine he was sweating a lot.  Pt wife said once they sat him down and he drank some water he was fine.  Pt wife wants a call back to know if maybe that was a mini stroke

## 2016-06-27 NOTE — Discharge Instructions (Signed)

## 2016-06-28 ENCOUNTER — Encounter (HOSPITAL_COMMUNITY)
Admission: RE | Admit: 2016-06-28 | Discharge: 2016-06-28 | Disposition: A | Discharge status: Home / Self Care | Payer: Medicare Other | Source: Ambulatory Visit | Attending: Nephrology | Admitting: Nephrology

## 2016-06-28 DIAGNOSIS — N183 Chronic kidney disease, stage 3 unspecified: Secondary | ICD-10-CM

## 2016-06-28 DIAGNOSIS — D631 Anemia in chronic kidney disease: Secondary | ICD-10-CM | POA: Insufficient documentation

## 2016-06-28 LAB — IRON AND TIBC
Iron: 44 ug/dL — ABNORMAL LOW (ref 45–182)
Saturation Ratios: 16 % — ABNORMAL LOW (ref 17.9–39.5)
TIBC: 269 ug/dL (ref 250–450)
UIBC: 225 ug/dL

## 2016-06-28 LAB — POCT HEMOGLOBIN-HEMACUE: HEMOGLOBIN: 11 g/dL — AB (ref 13.0–17.0)

## 2016-06-28 LAB — FERRITIN: Ferritin: 33 ng/mL (ref 24–336)

## 2016-06-28 MED ORDER — DARBEPOETIN ALFA 100 MCG/0.5ML IJ SOSY
PREFILLED_SYRINGE | INTRAMUSCULAR | Status: AC
  Filled 2016-06-28: qty 0.5

## 2016-06-28 MED ORDER — DARBEPOETIN ALFA 100 MCG/0.5ML IJ SOSY
100.0000 ug | PREFILLED_SYRINGE | INTRAMUSCULAR | Status: DC
  Administered 2016-06-28: 100 ug via SUBCUTANEOUS

## 2016-07-04 ENCOUNTER — Other Ambulatory Visit (HOSPITAL_COMMUNITY): Payer: Self-pay | Admitting: *Deleted

## 2016-07-05 ENCOUNTER — Encounter (HOSPITAL_COMMUNITY)
Admission: RE | Admit: 2016-07-05 | Discharge: 2016-07-05 | Disposition: A | Discharge status: Home / Self Care | Payer: Medicare Other | Source: Ambulatory Visit | Attending: Nephrology | Admitting: Nephrology

## 2016-07-05 DIAGNOSIS — N184 Chronic kidney disease, stage 4 (severe): Secondary | ICD-10-CM | POA: Insufficient documentation

## 2016-07-05 DIAGNOSIS — D631 Anemia in chronic kidney disease: Secondary | ICD-10-CM | POA: Insufficient documentation

## 2016-07-05 MED ORDER — SODIUM CHLORIDE 0.9 % IV SOLN
510.0000 mg | Freq: Once | INTRAVENOUS | Status: AC
  Administered 2016-07-05: 12:00:00 510 mg via INTRAVENOUS
  Filled 2016-07-05: qty 17

## 2016-07-05 NOTE — Discharge Instructions (Signed)

## 2016-07-20 ENCOUNTER — Other Ambulatory Visit: Payer: Self-pay | Admitting: Neurology

## 2016-07-26 ENCOUNTER — Encounter (HOSPITAL_COMMUNITY)
Admission: RE | Admit: 2016-07-26 | Discharge: 2016-07-26 | Disposition: A | Discharge status: Home / Self Care | Payer: Medicare Other | Source: Ambulatory Visit | Attending: Nephrology | Admitting: Nephrology

## 2016-07-26 ENCOUNTER — Encounter: Payer: Self-pay | Admitting: Surgery

## 2016-07-26 DIAGNOSIS — D631 Anemia in chronic kidney disease: Secondary | ICD-10-CM | POA: Diagnosis not present

## 2016-07-26 DIAGNOSIS — N183 Chronic kidney disease, stage 3 unspecified: Secondary | ICD-10-CM

## 2016-07-26 LAB — IRON AND TIBC
Iron: 64 ug/dL (ref 45–182)
SATURATION RATIOS: 27 % (ref 17.9–39.5)
TIBC: 241 ug/dL — ABNORMAL LOW (ref 250–450)
UIBC: 177 ug/dL

## 2016-07-26 LAB — POCT HEMOGLOBIN-HEMACUE: HEMOGLOBIN: 12 g/dL — AB (ref 13.0–17.0)

## 2016-07-26 LAB — FERRITIN: Ferritin: 176 ng/mL (ref 24–336)

## 2016-07-26 MED ORDER — DARBEPOETIN ALFA 60 MCG/0.3ML IJ SOSY: 60.0000 ug | PREFILLED_SYRINGE | INTRAMUSCULAR | Status: DC

## 2016-07-31 ENCOUNTER — Ambulatory Visit (INDEPENDENT_AMBULATORY_CARE_PROVIDER_SITE_OTHER)
Admission: RE | Admit: 2016-07-31 | Discharge: 2016-07-31 | Disposition: A | Discharge status: Home / Self Care | Payer: Medicare Other | Source: Ambulatory Visit | Attending: Surgery | Admitting: Surgery

## 2016-07-31 ENCOUNTER — Other Ambulatory Visit: Payer: Self-pay

## 2016-07-31 ENCOUNTER — Encounter: Payer: Self-pay | Admitting: Surgery

## 2016-07-31 ENCOUNTER — Ambulatory Visit (INDEPENDENT_AMBULATORY_CARE_PROVIDER_SITE_OTHER): Payer: Medicare Other | Admitting: Surgery

## 2016-07-31 ENCOUNTER — Ambulatory Visit (HOSPITAL_COMMUNITY)
Admission: RE | Admit: 2016-07-31 | Discharge: 2016-07-31 | Disposition: A | Discharge status: Home / Self Care | Payer: Medicare Other | Source: Ambulatory Visit | Attending: Surgery | Admitting: Surgery

## 2016-07-31 VITALS — BP 144/73 | HR 62 | Temp 97.4°F | Resp 16 | Ht 70.0 in | Wt 170.0 lb

## 2016-07-31 DIAGNOSIS — N184 Chronic kidney disease, stage 4 (severe): Secondary | ICD-10-CM

## 2016-07-31 MED ORDER — MEMANTINE HCL 10 MG PO TABS: 10.0000 mg | ORAL_TABLET | Freq: Two times a day (BID) | ORAL | 3 refills | Status: DC

## 2016-07-31 MED ORDER — LAMOTRIGINE 100 MG PO TABS: 100.0000 mg | ORAL_TABLET | Freq: Two times a day (BID) | ORAL | 3 refills | Status: DC
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
## 2016-08-08 ENCOUNTER — Encounter (HOSPITAL_COMMUNITY): Payer: Self-pay | Admitting: *Deleted

## 2016-08-08 DIAGNOSIS — I10 Essential (primary) hypertension: Secondary | ICD-10-CM | POA: Diagnosis not present

## 2016-08-08 DIAGNOSIS — E118 Type 2 diabetes mellitus with unspecified complications: Secondary | ICD-10-CM | POA: Diagnosis not present

## 2016-08-08 NOTE — Progress Notes (Addendum)
I spoke with Mrs Pech, patient has some memory loss.  Mrs Esper reports that patiebnt does not have chest pain or shortness of breath.  Mrs Halls reports that CBG's run 45 - 300's.  Mrs Huitron states that she will try to get patient to eat 3 meals tomorrow.  Mrs Huntsman reports that patient had labs drawn 08/07/16 at Dr Eugenio Hoes office.  I have requested results. I instructed Mrs Hair to give 1/2 of Levimir dose Wednesday at bedtime. I instructed Mrs. Puller to check CBG to check CBG and if it is less than 70 to treat it with Glucose Gel, 4 Glucose tablets  I instructed Mrs Aldape to recheck CBG in 15 minutes and if CBG is not greater than 70, to  Call [phone] (pre- op). If it is before pre-op opens to retreat as before and recheck CBG in 15 minutes. I told patient to make note of time that liquid is taken and amount, that surgical time may have to be adjusted.

## 2016-08-09 ENCOUNTER — Encounter (HOSPITAL_COMMUNITY)
Admission: RE | Admit: 2016-08-09 | Discharge: 2016-08-09 | Disposition: A | Discharge status: Home / Self Care | Payer: Medicare Other | Source: Ambulatory Visit | Attending: Nephrology | Admitting: Nephrology

## 2016-08-09 DIAGNOSIS — N183 Chronic kidney disease, stage 3 unspecified: Secondary | ICD-10-CM

## 2016-08-09 DIAGNOSIS — D631 Anemia in chronic kidney disease: Secondary | ICD-10-CM | POA: Diagnosis not present

## 2016-08-09 LAB — POCT HEMOGLOBIN-HEMACUE: Hemoglobin: 11.4 g/dL — ABNORMAL LOW (ref 13.0–17.0)

## 2016-08-09 MED ORDER — DARBEPOETIN ALFA 100 MCG/0.5ML IJ SOSY
100.0000 ug | PREFILLED_SYRINGE | Freq: Once | INTRAMUSCULAR | Status: AC
  Administered 2016-08-09: 100 ug via SUBCUTANEOUS

## 2016-08-09 MED ORDER — DARBEPOETIN ALFA 60 MCG/0.3ML IJ SOSY: 60.0000 ug | PREFILLED_SYRINGE | INTRAMUSCULAR | Status: DC

## 2016-08-09 MED ORDER — DARBEPOETIN ALFA 100 MCG/0.5ML IJ SOSY
PREFILLED_SYRINGE | INTRAMUSCULAR | Status: AC
  Administered 2016-08-09: 11:00:00 100 ug via SUBCUTANEOUS
  Filled 2016-08-09: qty 0.5

## 2016-08-10 ENCOUNTER — Ambulatory Visit (HOSPITAL_COMMUNITY)
Admission: RE | Admit: 2016-08-10 | Discharge: 2016-08-10 | Disposition: A | Discharge status: Home / Self Care | Payer: Medicare Other | Source: Ambulatory Visit | Attending: Surgery | Admitting: Surgery

## 2016-08-10 ENCOUNTER — Ambulatory Visit (HOSPITAL_COMMUNITY): Payer: Medicare Other | Admitting: Certified Registered Nurse Anesthetist

## 2016-08-10 ENCOUNTER — Telehealth: Payer: Self-pay | Admitting: Surgery

## 2016-08-10 ENCOUNTER — Encounter (HOSPITAL_COMMUNITY): Payer: Self-pay | Admitting: *Deleted

## 2016-08-10 ENCOUNTER — Encounter (HOSPITAL_COMMUNITY)
Admission: RE | Disposition: A | Discharge status: Home / Self Care | Payer: Self-pay | Source: Ambulatory Visit | Attending: Surgery

## 2016-08-10 DIAGNOSIS — E119 Type 2 diabetes mellitus without complications: Secondary | ICD-10-CM | POA: Diagnosis not present

## 2016-08-10 DIAGNOSIS — Z7982 Long term (current) use of aspirin: Secondary | ICD-10-CM | POA: Diagnosis not present

## 2016-08-10 DIAGNOSIS — N184 Chronic kidney disease, stage 4 (severe): Secondary | ICD-10-CM | POA: Diagnosis not present

## 2016-08-10 DIAGNOSIS — Z8673 Personal history of transient ischemic attack (TIA), and cerebral infarction without residual deficits: Secondary | ICD-10-CM | POA: Insufficient documentation

## 2016-08-10 DIAGNOSIS — E1122 Type 2 diabetes mellitus with diabetic chronic kidney disease: Secondary | ICD-10-CM | POA: Diagnosis not present

## 2016-08-10 DIAGNOSIS — I5042 Chronic combined systolic (congestive) and diastolic (congestive) heart failure: Secondary | ICD-10-CM | POA: Insufficient documentation

## 2016-08-10 DIAGNOSIS — E1151 Type 2 diabetes mellitus with diabetic peripheral angiopathy without gangrene: Secondary | ICD-10-CM | POA: Insufficient documentation

## 2016-08-10 DIAGNOSIS — N185 Chronic kidney disease, stage 5: Secondary | ICD-10-CM | POA: Diagnosis not present

## 2016-08-10 DIAGNOSIS — D649 Anemia, unspecified: Secondary | ICD-10-CM | POA: Diagnosis not present

## 2016-08-10 DIAGNOSIS — I129 Hypertensive chronic kidney disease with stage 1 through stage 4 chronic kidney disease, or unspecified chronic kidney disease: Secondary | ICD-10-CM | POA: Diagnosis not present

## 2016-08-10 DIAGNOSIS — Z79899 Other long term (current) drug therapy: Secondary | ICD-10-CM | POA: Diagnosis not present

## 2016-08-10 DIAGNOSIS — I130 Hypertensive heart and chronic kidney disease with heart failure and stage 1 through stage 4 chronic kidney disease, or unspecified chronic kidney disease: Secondary | ICD-10-CM | POA: Diagnosis not present

## 2016-08-10 DIAGNOSIS — Z794 Long term (current) use of insulin: Secondary | ICD-10-CM | POA: Diagnosis not present

## 2016-08-10 DIAGNOSIS — N400 Enlarged prostate without lower urinary tract symptoms: Secondary | ICD-10-CM | POA: Insufficient documentation

## 2016-08-10 HISTORY — DX: Family history of other specified conditions: Z84.89

## 2016-08-10 HISTORY — DX: Depression, unspecified: F32.A

## 2016-08-10 HISTORY — DX: Major depressive disorder, single episode, unspecified: F32.9

## 2016-08-10 HISTORY — PX: BASCILIC VEIN TRANSPOSITION: SHX5742

## 2016-08-10 HISTORY — DX: Unspecified osteoarthritis, unspecified site: M19.90

## 2016-08-10 LAB — POCT I-STAT 4, (NA,K, GLUC, HGB,HCT)
GLUCOSE: 97 mg/dL (ref 65–99)
HEMATOCRIT: 35 % — AB (ref 39.0–52.0)
Hemoglobin: 11.9 g/dL — ABNORMAL LOW (ref 13.0–17.0)
POTASSIUM: 6 mmol/L — AB (ref 3.5–5.1)
SODIUM: 141 mmol/L (ref 135–145)

## 2016-08-10 LAB — BASIC METABOLIC PANEL
ANION GAP: 6 (ref 5–15)
BUN: 66 mg/dL — ABNORMAL HIGH (ref 6–20)
CALCIUM: 9.1 mg/dL (ref 8.9–10.3)
CO2: 27 mmol/L (ref 22–32)
CREATININE: 2.98 mg/dL — AB (ref 0.61–1.24)
Chloride: 107 mmol/L (ref 101–111)
GFR calc Af Amer: 23 mL/min — ABNORMAL LOW (ref >60)
GFR calc non Af Amer: 20 mL/min — ABNORMAL LOW (ref >60)
GLUCOSE: 100 mg/dL — AB (ref 65–99)
Potassium: 5 mmol/L (ref 3.5–5.1)
Sodium: 140 mmol/L (ref 135–145)

## 2016-08-10 LAB — GLUCOSE, CAPILLARY: GLUCOSE-CAPILLARY: 127 mg/dL — AB (ref 65–99)

## 2016-08-10 SURGERY — TRANSPOSITION, VEIN, BASILIC
Anesthesia: Monitor Anesthesia Care | Site: Arm Upper | Laterality: Right

## 2016-08-10 MED ORDER — PROPOFOL 10 MG/ML IV BOLUS
INTRAVENOUS | Status: AC
  Filled 2016-08-10: qty 40

## 2016-08-10 MED ORDER — PROPOFOL 10 MG/ML IV BOLUS
INTRAVENOUS | Status: DC | PRN
  Administered 2016-08-10 (×2): 10 mg via INTRAVENOUS

## 2016-08-10 MED ORDER — MIDAZOLAM HCL 2 MG/2ML IJ SOLN
INTRAMUSCULAR | Status: AC
  Filled 2016-08-10: qty 2

## 2016-08-10 MED ORDER — 0.9 % SODIUM CHLORIDE (POUR BTL) OPTIME
TOPICAL | Status: DC | PRN
  Administered 2016-08-10: 1000 mL

## 2016-08-10 MED ORDER — CHLORHEXIDINE GLUCONATE CLOTH 2 % EX PADS: 6.0000 | MEDICATED_PAD | Freq: Once | CUTANEOUS | Status: DC

## 2016-08-10 MED ORDER — FENTANYL CITRATE (PF) 100 MCG/2ML IJ SOLN
INTRAMUSCULAR | Status: DC | PRN
  Administered 2016-08-10: 25 ug via INTRAVENOUS

## 2016-08-10 MED ORDER — HEPARIN SODIUM (PORCINE) 5000 UNIT/ML IJ SOLN
INTRAMUSCULAR | Status: DC | PRN
  Administered 2016-08-10: 10:00:00 500 mL

## 2016-08-10 MED ORDER — HEPARIN SODIUM (PORCINE) 1000 UNIT/ML IJ SOLN
INTRAMUSCULAR | Status: DC | PRN
Start: 2016-08-10 — End: 2016-08-10
  Administered 2016-08-10: 3000 [IU] via INTRAVENOUS

## 2016-08-10 MED ORDER — PROTAMINE SULFATE 10 MG/ML IV SOLN
INTRAVENOUS | Status: DC | PRN
  Administered 2016-08-10: 25 mg via INTRAVENOUS

## 2016-08-10 MED ORDER — SODIUM CHLORIDE 0.9 % IV SOLN
INTRAVENOUS | Status: DC
  Administered 2016-08-10: 08:00:00 via INTRAVENOUS

## 2016-08-10 MED ORDER — PROTAMINE SULFATE 10 MG/ML IV SOLN
INTRAVENOUS | Status: AC
  Filled 2016-08-10: qty 25

## 2016-08-10 MED ORDER — PROTAMINE SULFATE 10 MG/ML IV SOLN
INTRAVENOUS | Status: AC
  Filled 2016-08-10: qty 5

## 2016-08-10 MED ORDER — VANCOMYCIN HCL IN DEXTROSE 1-5 GM/200ML-% IV SOLN
INTRAVENOUS | Status: AC
  Filled 2016-08-10: qty 200

## 2016-08-10 MED ORDER — LIDOCAINE-EPINEPHRINE (PF) 1 %-1:200000 IJ SOLN
INTRAMUSCULAR | Status: AC
  Filled 2016-08-10: qty 30

## 2016-08-10 MED ORDER — FENTANYL CITRATE (PF) 250 MCG/5ML IJ SOLN
INTRAMUSCULAR | Status: AC
  Filled 2016-08-10: qty 5

## 2016-08-10 MED ORDER — PROPOFOL 500 MG/50ML IV EMUL
INTRAVENOUS | Status: DC | PRN
  Administered 2016-08-10: 20 ug/kg/min via INTRAVENOUS

## 2016-08-10 MED ORDER — LIDOCAINE-EPINEPHRINE (PF) 1 %-1:200000 IJ SOLN
INTRAMUSCULAR | Status: DC | PRN
  Administered 2016-08-10: 9 mL

## 2016-08-10 MED ORDER — HEMOSTATIC AGENTS (NO CHARGE) OPTIME
TOPICAL | Status: DC | PRN
  Administered 2016-08-10: 1 via TOPICAL

## 2016-08-10 MED ORDER — VANCOMYCIN HCL IN DEXTROSE 1-5 GM/200ML-% IV SOLN
1000.0000 mg | INTRAVENOUS | Status: AC
  Administered 2016-08-10: 1000 mg via INTRAVENOUS

## 2016-08-10 MED ORDER — HEPARIN SODIUM (PORCINE) 1000 UNIT/ML IJ SOLN
INTRAMUSCULAR | Status: AC
  Filled 2016-08-10: qty 1

## 2016-08-10 MED ORDER — FENTANYL CITRATE (PF) 100 MCG/2ML IJ SOLN: 25.0000 ug | INTRAMUSCULAR | Status: DC | PRN

## 2016-08-10 SURGICAL SUPPLY — 38 items
ADH SKN CLS APL DERMABOND .7 (GAUZE/BANDAGES/DRESSINGS) ×1
ARMBAND PINK RESTRICT EXTREMIT (MISCELLANEOUS) ×3 IMPLANT
CANISTER SUCT 3000ML PPV (MISCELLANEOUS) ×3 IMPLANT
CLIP TI MEDIUM 24 (CLIP) IMPLANT
CLIP TI MEDIUM 6 (CLIP) IMPLANT
CLIP TI WIDE RED SMALL 24 (CLIP) IMPLANT
CLIP TI WIDE RED SMALL 6 (CLIP) IMPLANT
COVER PROBE W GEL 5X96 (DRAPES) ×3 IMPLANT
DERMABOND ADVANCED (GAUZE/BANDAGES/DRESSINGS) ×2
DERMABOND ADVANCED .7 DNX12 (GAUZE/BANDAGES/DRESSINGS) ×1 IMPLANT
ELECT REM PT RETURN 9FT ADLT (ELECTROSURGICAL) ×3
ELECTRODE REM PT RTRN 9FT ADLT (ELECTROSURGICAL) ×1 IMPLANT
GLOVE BIO SURGEON STRL SZ 6.5 (GLOVE) ×4 IMPLANT
GLOVE BIO SURGEONS STRL SZ 6.5 (GLOVE) ×2
GLOVE BIOGEL PI IND STRL 6.5 (GLOVE) ×3 IMPLANT
GLOVE BIOGEL PI IND STRL 7.5 (GLOVE) ×1 IMPLANT
GLOVE BIOGEL PI INDICATOR 6.5 (GLOVE) ×6
GLOVE BIOGEL PI INDICATOR 7.5 (GLOVE) ×2
GLOVE ECLIPSE 6.5 STRL STRAW (GLOVE) ×3 IMPLANT
GLOVE SURG SS PI 7.5 STRL IVOR (GLOVE) ×3 IMPLANT
GOWN STRL REUS W/ TWL LRG LVL3 (GOWN DISPOSABLE) ×3 IMPLANT
GOWN STRL REUS W/ TWL XL LVL3 (GOWN DISPOSABLE) ×1 IMPLANT
GOWN STRL REUS W/TWL LRG LVL3 (GOWN DISPOSABLE) ×9
GOWN STRL REUS W/TWL XL LVL3 (GOWN DISPOSABLE) ×3
HEMOSTAT SNOW SURGICEL 2X4 (HEMOSTASIS) ×3 IMPLANT
KIT BASIN OR (CUSTOM PROCEDURE TRAY) ×3 IMPLANT
KIT ROOM TURNOVER OR (KITS) ×3 IMPLANT
NS IRRIG 1000ML POUR BTL (IV SOLUTION) ×3 IMPLANT
PACK CV ACCESS (CUSTOM PROCEDURE TRAY) ×3 IMPLANT
PAD ARMBOARD 7.5X6 YLW CONV (MISCELLANEOUS) ×6 IMPLANT
SUT PROLENE 6 0 CC (SUTURE) ×3 IMPLANT
SUT PROLENE 8 0 BV175 6 (SUTURE) ×3 IMPLANT
SUT SILK 2 0 SH (SUTURE) IMPLANT
SUT VIC AB 3-0 SH 27 (SUTURE) ×3
SUT VIC AB 3-0 SH 27X BRD (SUTURE) ×1 IMPLANT
SUT VICRYL 4-0 PS2 18IN ABS (SUTURE) ×3 IMPLANT
UNDERPAD 30X30 (UNDERPADS AND DIAPERS) ×3 IMPLANT
WATER STERILE IRR 1000ML POUR (IV SOLUTION) ×3 IMPLANT

## 2016-08-10 NOTE — Interval H&P Note (Signed)
History and Physical Interval Note:  08/10/2016 9:51 AM  [name]  has presented today for surgery, with the diagnosis of CHRONIC KIDNEY DISEASE STAGE 4  The various methods of treatment have been discussed with the patient and family. After consideration of risks, benefits and other options for treatment, the patient has consented to  Procedure(s): RIGHT - 1ST STAGE [place] (Right) as a surgical intervention .  The patient's history has been reviewed, patient examined, no change in status, stable for surgery.  I have reviewed the patient's chart and labs.  Questions were answered to the patient's satisfaction.     Annamarie Major
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
## 2016-08-10 NOTE — Anesthesia Postprocedure Evaluation (Signed)
Anesthesia Post Note  Patient: [name]  Procedure(s) Performed: Procedure(s) (LRB): RIGHT arm 1ST STAGE BASCILIC VEIN TRANSPOSITION (Right)  Patient location during evaluation: PACU Anesthesia Type: MAC Level of consciousness: awake and alert Pain management: pain level controlled Vital Signs Assessment: post-procedure vital signs reviewed and stable Respiratory status: spontaneous breathing, nonlabored ventilation and respiratory function stable Cardiovascular status: stable and blood pressure returned to baseline Anesthetic complications: no       Last Vitals:  Vitals:   08/10/16 1145 08/10/16 1153  BP:  (!) 152/67  Pulse: (!) 57 (!) 55  Resp: 14 14  Temp:      Last Pain:  Vitals:   08/10/16 0756  TempSrc: Oral                 Elleen Coulibaly,W. EDMOND

## 2016-08-10 NOTE — Telephone Encounter (Signed)
-----   Message from Mena Goes, RN sent at 08/10/2016 11:39 AM EDT ----- Regarding: 4-6 weeks w/ duplex   ----- Message ----- From: Gabriel Earing, PA-C Sent: 08/10/2016  11:22 AM To: Vvs Charge Pool  S/p right 1st stage BVT 08/10/16.  F/u with Dr. Trula Slade in 4-6 weeks with duplex.  Thanks

## 2016-08-10 NOTE — Transfer of Care (Signed)
Immediate Anesthesia Transfer of Care Note  Patient: JAIVON VANBEEK  Procedure(s) Performed: Procedure(s): RIGHT arm 1ST STAGE [place] (Right)  Patient Location: PACU  Anesthesia Type:General  Level of Consciousness: awake, alert  and oriented  Airway & Oxygen Therapy: Patient Spontanous Breathing  Post-op Assessment: Report given to RN and Post -op Vital signs reviewed and stable  Post vital signs: Reviewed and stable  Last Vitals:  Vitals:   08/10/16 0756 08/10/16 0758  BP:  (!) 192/68  Pulse: (!) 56   Resp: 20   Temp: 36.6 C     Last Pain:  Vitals:   08/10/16 0756  TempSrc: Oral      Patients Stated Pain Goal: 3 (02/40/97 3532)  Complications: No apparent anesthesia complications

## 2016-08-10 NOTE — Anesthesia Preprocedure Evaluation (Addendum)
Anesthesia Evaluation  Patient identified by MRN, date of birth, ID band Patient awake    Reviewed: Allergy & Precautions, H&P , NPO status , Patient's Chart, lab work & pertinent test results, reviewed documented beta blocker date and time   Airway Mallampati: II  TM Distance: >3 FB Neck ROM: Full    Dental no notable dental hx. (+) Partial Upper, Partial Lower, Dental Advisory Given   Pulmonary neg pulmonary ROS,    Pulmonary exam normal breath sounds clear to auscultation       Cardiovascular hypertension, Pt. on medications and Pt. on home beta blockers + Peripheral Vascular Disease and +CHF   Rhythm:Regular Rate:Normal     Neuro/Psych Seizures -, Well Controlled,  Depression CVA    GI/Hepatic negative GI ROS, Neg liver ROS,   Endo/Other  diabetes, Insulin Dependent  Renal/GU Renal InsufficiencyRenal disease  negative genitourinary   Musculoskeletal  (+) Arthritis , Osteoarthritis,    Abdominal   Peds  Hematology negative hematology ROS (+) anemia ,   Anesthesia Other Findings   Reproductive/Obstetrics negative OB ROS                            Anesthesia Physical Anesthesia Plan  ASA: III  Anesthesia Plan: MAC   Post-op Pain Management:    Induction: Intravenous  Airway Management Planned: Simple Face Mask  Additional Equipment:   Intra-op Plan:   Post-operative Plan:   Informed Consent: I have reviewed the patients History and Physical, chart, labs and discussed the procedure including the risks, benefits and alternatives for the proposed anesthesia with the patient or authorized representative who has indicated his/her understanding and acceptance.   Dental advisory given  Plan Discussed with: CRNA  Anesthesia Plan Comments:         Anesthesia Quick Evaluation

## 2016-08-10 NOTE — Op Note (Signed)
    Patient name: [name] MRN: [id number] DOB: June 04, 1945 Sex: male  08/10/2016 Pre-operative Diagnosis: CKD Post-operative diagnosis:  Same Surgeon:  Annamarie Major Assistants:  S. Rhyne Procedure:   Right 1st stage basilic vein fistula Anesthesia:  MAC Blood Loss:  See anesthesia record Specimens:  none  Findings:  Thickened vein  Indications:  The patient is here for fistula creation  Procedure:  The patient was identified in the holding area and taken to [place] 11  The patient was then placed supine on the table. MAC anesthesia was administered.  The patient was prepped and draped in the usual sterile fashion.  A time out was called and antibiotics were administered.  U/s was used to identify the basilic vein.  A oblique incision was made at the antecubital crease.  I first dissected out the bracjial artery.  It was healthy and 22m.  Next I identified the basilic vein.  It was whitish in appearance.  It was fully mobilized.  Side branches were divided.  3000 units of heparin were given.  The vein was ligated distally.  It was somewhat thickened.  It did distend to 3-4 mm.  The end was spatulated.  The artery ws then occluded and opened with a #11 blade and extended with potts scissors in a longitudinal direction.  A end to side anastamosis was created with 6-0 prolene.  There was a good thrill in the fistula.  There was a good radial artery signal.  25 mg of protamine was given.  I used a 8-0 prolene stich to repair what could have been a sensory nerve superficial injury.  Once hemostasis was achieved, the incision was closed with 2 layers of 3-0 vicryl, followed by dermabond.   Disposition:  To PACU stable   V. WAnnamarie Major M.D. [facility] [place]: [phone]:  [phone]

## 2016-08-10 NOTE — Discharge Instructions (Signed)
° ° °  08/10/2016 [name] [id number] 16-Aug-1945  Surgeon(s): Serafina Mitchell, MD  Procedure(s): RIGHT arm 1ST STAGE BASILIC VEIN TRANSPOSITION  x Do not stick fistula for 12 weeks

## 2016-08-10 NOTE — Telephone Encounter (Signed)
Sched lab 09/12/16 at 4:00 and MD 09/18/16 at 1:30. Spoke to pt's wife to confirm appts.

## 2016-08-11 ENCOUNTER — Encounter (HOSPITAL_COMMUNITY): Payer: Self-pay | Admitting: Surgery

## 2016-08-15 DIAGNOSIS — G629 Polyneuropathy, unspecified: Secondary | ICD-10-CM | POA: Diagnosis not present

## 2016-08-15 DIAGNOSIS — E118 Type 2 diabetes mellitus with unspecified complications: Secondary | ICD-10-CM | POA: Diagnosis not present

## 2016-08-15 DIAGNOSIS — N181 Chronic kidney disease, stage 1: Secondary | ICD-10-CM | POA: Diagnosis not present

## 2016-08-15 DIAGNOSIS — E119 Type 2 diabetes mellitus without complications: Secondary | ICD-10-CM | POA: Diagnosis not present

## 2016-08-15 DIAGNOSIS — Z961 Presence of intraocular lens: Secondary | ICD-10-CM | POA: Diagnosis not present

## 2016-08-15 DIAGNOSIS — H02822 Cysts of right lower eyelid: Secondary | ICD-10-CM | POA: Diagnosis not present

## 2016-08-15 DIAGNOSIS — H401133 Primary open-angle glaucoma, bilateral, severe stage: Secondary | ICD-10-CM | POA: Diagnosis not present

## 2016-08-21 NOTE — Progress Notes (Signed)
Patient ID: [name], male   DOB: [birth year], 71 y.o.   MRN: [id number]  [name] is a 71 y.o. M with history of DM, CKD stage 3 , HTN, seizure disorder, and multiple prior episodes of osteomyelitis with   complications including left BKA  Had fall with left femoral neck fracture 06/02/15  Hospitalized 06/2015 with syncope thought to be vasovagal negative w/u in hospital  Carotids plaque no stenosis   Seeing neuro for memory issues from previous left occipital infarct and lacunar infarcts.  On Namenda  02/11/13 non ischemic myovue but consistent with ischemic DCM Overall Impression: Intermediate risk stress nuclear study with primarily fixed, medium-sized basal to mid anterior and primarily fixed, small basal inferior perfusion defects. There does not appear to be significant ischemia (intermediate risk because of EF). Difficult study because of significant patient motion, therefore defects may be artifactual.   LV Ejection Fraction: 36%. LV Wall Motion: Global hypokinesis.   Echo  07/20/15  EF improved  Study Conclusions  - Left ventricle: The cavity size was normal. Wall thickness was  normal. Systolic function was mildly reduced. The estimated  ejection fraction was in the range of 45% to 50%. Regional wall  motion abnormalities cannot be excluded. Doppler parameters are  consistent with abnormal left ventricular relaxation (grade 1  diastolic dysfunction). The E/e&' ratio is <8, suggesting normal  LV filling pressure. - Aortic valve: Structurally normal valve. Trileaflet. - Mitral valve: Mildly thickened leaflets . There was trivial  regurgitation. - Left atrium: The atrium was normal in size. - Right ventricle: The cavity size was mildly dilated. Systolic  function is mildly reduced. - Right atrium: The atrium was normal in size. - Atrial septum: No defect or patent foramen ovale was identified. - Inferior vena cava: The vessel was normal in size. The  respirophasic  diameter changes were in the normal range (>= 50%),  consistent with normal central venous pressure.   Renal failure is worse: Had first stage of right UE fistula creation with Dr Trula Slade on 08/10/16 Some swelling in right arm need f/u with [facility]   ROS: Denies fever, malais, weight loss, blurry vision, decreased visual acuity, cough, sputum, SOB, hemoptysis, pleuritic pain, palpitaitons, heartburn, abdominal pain, melena, lower extremity edema, claudication, or rash.  All other systems reviewed and negative  General: Affect appropriate Chronically ill black male HEENT: normal Neck supple with no adenopathy JVP normal left  bruits no thyromegaly Lungs clear with no wheezing and good diaphragmatic motion Heart:  S1/S2 no murmur, no rub, gallop or click PMI normal Abdomen: benighn, BS positve, no tenderness, no AAA no bruit.  No HSM or HJR Distal pulses intact to popliteal S/P left BKA  Right leg in boot with healing infection in foot No edema Neuro right hand weak with thenar atrophy  Skin warm and dry LE weakness  RUE fistula new with mild edema distal to it including hand good thrill    Current Outpatient Prescriptions  Medication Sig Dispense Refill  . amLODipine (NORVASC) 5 MG tablet Take 5 mg by mouth daily.    . ASPERCREME LIDOCAINE EX Apply 1 application topically daily as needed (pain).    [name] aspirin 325 MG tablet Take 325 mg by mouth daily.    [name] atorvastatin (LIPITOR) 20 MG tablet Take 20 mg by mouth every evening.     . carvedilol (COREG) 6.25 MG tablet Take 1 tablet (6.25 mg total) by mouth 2 (two) times daily with a meal. 60 tablet 0  .  docusate sodium (COLACE) 100 MG capsule Take 1 capsule (100 mg total) by mouth 2 (two) times daily. 10 capsule 0  . furosemide (LASIX) 40 MG tablet Take 40 mg by mouth daily.     . insulin detemir (LEVEMIR) 100 UNIT/ML injection Inject 14 Units into the skin at bedtime.     . isosorbide-hydrALAZINE (BIDIL) 20-37.5 MG tablet Take 1 tablet  by mouth 2 (two) times daily. 60 tablet 11  . lamoTRIgine (LAMICTAL) 100 MG tablet Take 1 tablet (100 mg total) by mouth 2 (two) times daily. 180 tablet 3  . LYRICA 75 MG capsule Take 75 mg by mouth 2 (two) times daily.     . memantine (NAMENDA) 10 MG tablet Take 1 tablet (10 mg total) by mouth 2 (two) times daily. 180 tablet 3  . Multiple Vitamins-Minerals (CENTRUM SILVER ADULT 50+ PO) Take 1 tablet by mouth daily.     . ONE TOUCH ULTRA TEST test strip     . polyethylene glycol (MIRALAX / GLYCOLAX) packet Take 17 g by mouth daily as needed for mild constipation.    . silver sulfADIAZINE (SILVADENE) 1 % cream Apply 1 application topically daily as needed (sores).     . tamsulosin (FLOMAX) 0.4 MG CAPS capsule Take 1 capsule (0.4 mg total) by mouth daily after supper. 30 capsule 5  . timolol (BETIMOL) 0.5 % ophthalmic solution Place 1 drop into both eyes 2 (two) times daily.     . Travoprost, BAK Free, ([name]) 0.004 % SOLN ophthalmic solution Place 1 drop into both eyes at bedtime.    [name] venlafaxine XR (EFFEXOR-XR) 75 MG 24 hr capsule Take 75 mg by mouth daily.    . vitamin C (ASCORBIC ACID) 500 MG tablet Take 500 mg by mouth daily.     No current facility-administered medications for this visit.     Allergies  Codeine and Penicillins  Electrocardiogram:  12/06/13 SR rate 89 inferior T wave changes 07/14/14  SR rate 65  LVH otherwise normal  Assessment and Plan CAD:   Stable with no angina and good activity level.  Continue medical Rx CHF:  Euvolemic  EF improved 40-45% by recent echo  Start bidil as Cr over 2   PVD:  F/u vascular stump looks good   Chol:  On statin  Cholesterol is at goal.  Continue current dose of statin and diet Rx.  No myalgias or side effects.  F/U  LFT's in 6 months. Lab Results  Component Value Date   LDLCALC 73 12/04/2012  Left Bruit:  Plaque no stenosis on duplex 07/20/15 reviewed.  ASA  Sycnope: likely vasovagal improved no evidence of hypoglycemia or recurrent  seizure  CRF:  Cr close to 3 recent fistula RUE f/u renal and [facility]  Needs f/u for edema In arm post Op discussed with [facility] office  Neuro:  No change in dementia/memory with namenda f/u with neuro    F/u with me in a year   [employer]

## 2016-08-28 ENCOUNTER — Ambulatory Visit (INDEPENDENT_AMBULATORY_CARE_PROVIDER_SITE_OTHER): Payer: Medicare Other | Admitting: Cardiovascular Disease

## 2016-08-28 ENCOUNTER — Encounter: Payer: Self-pay | Admitting: Cardiovascular Disease

## 2016-08-28 VITALS — BP 160/64 | HR 66 | Ht 70.0 in | Wt 170.0 lb

## 2016-08-28 DIAGNOSIS — I5042 Chronic combined systolic (congestive) and diastolic (congestive) heart failure: Secondary | ICD-10-CM

## 2016-08-28 NOTE — Patient Instructions (Signed)
Your physician recommends that you continue on your current medications as directed. Please refer to the Current Medication list given to you today.  Your physician wants you to follow-up in: YEAR WITH DR NISHAN You will receive a reminder letter in the mail two months in advance. If you don't receive a letter, please call our office to schedule the follow-up appointment.  

## 2016-08-29 DIAGNOSIS — N2581 Secondary hyperparathyroidism of renal origin: Secondary | ICD-10-CM | POA: Diagnosis not present

## 2016-08-29 DIAGNOSIS — N184 Chronic kidney disease, stage 4 (severe): Secondary | ICD-10-CM | POA: Diagnosis not present

## 2016-08-29 DIAGNOSIS — E1122 Type 2 diabetes mellitus with diabetic chronic kidney disease: Secondary | ICD-10-CM | POA: Diagnosis not present

## 2016-08-29 DIAGNOSIS — D649 Anemia, unspecified: Secondary | ICD-10-CM | POA: Diagnosis not present

## 2016-08-29 DIAGNOSIS — I129 Hypertensive chronic kidney disease with stage 1 through stage 4 chronic kidney disease, or unspecified chronic kidney disease: Secondary | ICD-10-CM | POA: Diagnosis not present

## 2016-09-06 ENCOUNTER — Encounter (HOSPITAL_COMMUNITY)
Admission: RE | Admit: 2016-09-06 | Discharge: 2016-09-06 | Disposition: A | Discharge status: Home / Self Care | Payer: Medicare Other | Source: Ambulatory Visit | Attending: Nephrology | Admitting: Nephrology

## 2016-09-06 DIAGNOSIS — D631 Anemia in chronic kidney disease: Secondary | ICD-10-CM | POA: Diagnosis not present

## 2016-09-06 DIAGNOSIS — N183 Chronic kidney disease, stage 3 unspecified: Secondary | ICD-10-CM

## 2016-09-06 LAB — IRON AND TIBC
IRON: 77 ug/dL (ref 45–182)
Saturation Ratios: 32 % (ref 17.9–39.5)
TIBC: 242 ug/dL — ABNORMAL LOW (ref 250–450)
UIBC: 165 ug/dL

## 2016-09-06 LAB — POCT HEMOGLOBIN-HEMACUE: Hemoglobin: 11 g/dL — ABNORMAL LOW (ref 13.0–17.0)

## 2016-09-06 LAB — FERRITIN: FERRITIN: 137 ng/mL (ref 24–336)

## 2016-09-06 MED ORDER — DARBEPOETIN ALFA 60 MCG/0.3ML IJ SOSY
60.0000 ug | PREFILLED_SYRINGE | INTRAMUSCULAR | Status: DC
  Administered 2016-09-06: 60 ug via SUBCUTANEOUS

## 2016-09-06 MED ORDER — DARBEPOETIN ALFA 60 MCG/0.3ML IJ SOSY
PREFILLED_SYRINGE | INTRAMUSCULAR | Status: AC
  Filled 2016-09-06: qty 0.3

## 2016-09-07 ENCOUNTER — Encounter: Payer: Self-pay | Admitting: Surgery

## 2016-09-07 NOTE — Addendum Note (Signed)
Addended by: Lianne Cure A on: 09/07/2016 03:50 PM   Modules accepted: Orders

## 2016-09-12 ENCOUNTER — Ambulatory Visit (HOSPITAL_COMMUNITY)
Admit: 2016-09-12 | Discharge: 2016-09-12 | Disposition: A | Discharge status: Home / Self Care | Payer: Medicare Other | Attending: Vascular Surgery | Admitting: Vascular Surgery

## 2016-09-12 DIAGNOSIS — N184 Chronic kidney disease, stage 4 (severe): Secondary | ICD-10-CM | POA: Diagnosis not present

## 2016-09-18 ENCOUNTER — Ambulatory Visit (INDEPENDENT_AMBULATORY_CARE_PROVIDER_SITE_OTHER): Payer: Self-pay | Admitting: Surgery

## 2016-09-18 ENCOUNTER — Encounter: Payer: Self-pay | Admitting: Surgery

## 2016-09-18 VITALS — BP 155/76 | HR 64 | Temp 97.7°F | Resp 20 | Ht 70.0 in | Wt 172.0 lb

## 2016-09-18 DIAGNOSIS — N184 Chronic kidney disease, stage 4 (severe): Secondary | ICD-10-CM

## 2016-09-18 NOTE — Progress Notes (Signed)
Patient name: [name] MRN: [id number] DOB: [birth year] Sex: male  REASON FOR VISIT:     post op  HISTORY OF PRESENT ILLNESS:   [name] is a 71 y.o. male who returns today for follow up. He is status post right first stage basilic vein fistula on 32/67/1245.  The vein was thickened at the time of his operation.  He is on complaint today is that of right arm swelling  CURRENT MEDICATIONS:    Current Outpatient Prescriptions  Medication Sig Dispense Refill  . amLODipine (NORVASC) 5 MG tablet Take 5 mg by mouth daily.    . ASPERCREME LIDOCAINE EX Apply 1 application topically daily as needed (pain).    [name] aspirin 325 MG tablet Take 325 mg by mouth daily.    [name] atorvastatin (LIPITOR) 20 MG tablet Take 20 mg by mouth every evening.     . carvedilol (COREG) 6.25 MG tablet Take 1 tablet (6.25 mg total) by mouth 2 (two) times daily with a meal. 60 tablet 0  . docusate sodium (COLACE) 100 MG capsule Take 1 capsule (100 mg total) by mouth 2 (two) times daily. 10 capsule 0  . furosemide (LASIX) 40 MG tablet Take 40 mg by mouth daily.     . insulin detemir (LEVEMIR) 100 UNIT/ML injection Inject 14 Units into the skin at bedtime.     . isosorbide-hydrALAZINE (BIDIL) 20-37.5 MG tablet Take 1 tablet by mouth 2 (two) times daily. 60 tablet 11  . lamoTRIgine (LAMICTAL) 100 MG tablet Take 1 tablet (100 mg total) by mouth 2 (two) times daily. 180 tablet 3  . LYRICA 75 MG capsule Take 75 mg by mouth 2 (two) times daily.     . memantine (NAMENDA) 10 MG tablet Take 1 tablet (10 mg total) by mouth 2 (two) times daily. 180 tablet 3  . Multiple Vitamins-Minerals (CENTRUM SILVER ADULT 50+ PO) Take 1 tablet by mouth daily.     . ONE TOUCH ULTRA TEST test strip     . polyethylene glycol (MIRALAX / GLYCOLAX) packet Take 17 g by mouth daily as needed for mild constipation.    . silver sulfADIAZINE (SILVADENE) 1 % cream Apply 1 application topically daily as needed (sores).      . tamsulosin (FLOMAX) 0.4 MG CAPS capsule Take 1 capsule (0.4 mg total) by mouth daily after supper. 30 capsule 5  . timolol (BETIMOL) 0.5 % ophthalmic solution Place 1 drop into both eyes 2 (two) times daily.     . Travoprost, BAK Free, ([name]) 0.004 % SOLN ophthalmic solution Place 1 drop into both eyes at bedtime.    [name] venlafaxine XR (EFFEXOR-XR) 75 MG 24 hr capsule Take 75 mg by mouth daily.    . vitamin C (ASCORBIC ACID) 500 MG tablet Take 500 mg by mouth daily.     No current facility-administered medications for this visit.     REVIEW OF SYSTEMS:   [X]  denotes positive finding, [ ]  denotes negative finding Cardiac  Comments:  Chest pain or chest pressure:    Shortness of breath upon exertion:    Short of breath when lying flat:    Irregular heart rhythm:    Constitutional    Fever or chills:      PHYSICAL EXAM:   There were no vitals filed for this visit.  GENERAL: The patient is a well-nourished male, in no acute distress. The vital signs are documented above. CARDIOVASCULAR: There is a regular rate and rhythm. PULMONARY: Non-labored  respirations Mild right arm swelling.  Palpable thrill within fistula.  Incision is well-healed  STUDIES:   Duplex today shows that the vein did not dilate    MEDICAL ISSUES:   The right basilic vein fistula did not mature.  This is not surprising given the appearance of the vein in the operating room.  The patient will need a left forearm versus upper arm graft when he gets closer to the time of dialysis.  The patient has mild swelling in the right arm.  I placed an Ace wrap on this today and told him to keep it elevated.  I have scheduled him to follow-up with me in 2 months.  If the swelling has not resolved, I would go back in and ligated as fistula.  Annamarie Major, MD [facility] [phone] Pager [phone]

## 2016-10-02 DIAGNOSIS — N184 Chronic kidney disease, stage 4 (severe): Secondary | ICD-10-CM | POA: Diagnosis not present

## 2016-10-02 DIAGNOSIS — E1122 Type 2 diabetes mellitus with diabetic chronic kidney disease: Secondary | ICD-10-CM | POA: Diagnosis not present

## 2016-10-02 DIAGNOSIS — D649 Anemia, unspecified: Secondary | ICD-10-CM | POA: Diagnosis not present

## 2016-10-02 DIAGNOSIS — N2581 Secondary hyperparathyroidism of renal origin: Secondary | ICD-10-CM | POA: Diagnosis not present

## 2016-10-02 DIAGNOSIS — I129 Hypertensive chronic kidney disease with stage 1 through stage 4 chronic kidney disease, or unspecified chronic kidney disease: Secondary | ICD-10-CM | POA: Diagnosis not present

## 2016-10-03 ENCOUNTER — Other Ambulatory Visit (HOSPITAL_COMMUNITY): Payer: Self-pay | Admitting: *Deleted

## 2016-10-04 ENCOUNTER — Encounter (HOSPITAL_COMMUNITY)
Admission: RE | Admit: 2016-10-04 | Discharge: 2016-10-04 | Disposition: A | Discharge status: Home / Self Care | Payer: Medicare Other | Source: Ambulatory Visit | Attending: Nephrology | Admitting: Nephrology

## 2016-10-04 DIAGNOSIS — N183 Chronic kidney disease, stage 3 unspecified: Secondary | ICD-10-CM

## 2016-10-04 DIAGNOSIS — D631 Anemia in chronic kidney disease: Secondary | ICD-10-CM | POA: Insufficient documentation

## 2016-10-04 LAB — IRON AND TIBC
Iron: 71 ug/dL (ref 45–182)
SATURATION RATIOS: 29 % (ref 17.9–39.5)
TIBC: 245 ug/dL — AB (ref 250–450)
UIBC: 174 ug/dL

## 2016-10-04 LAB — FERRITIN: FERRITIN: 204 ng/mL (ref 24–336)

## 2016-10-04 LAB — POCT HEMOGLOBIN-HEMACUE: HEMOGLOBIN: 9.2 g/dL — AB (ref 13.0–17.0)

## 2016-10-04 MED ORDER — DARBEPOETIN ALFA 60 MCG/0.3ML IJ SOSY
PREFILLED_SYRINGE | INTRAMUSCULAR | Status: AC
  Filled 2016-10-04: qty 0.3

## 2016-10-04 MED ORDER — DARBEPOETIN ALFA 60 MCG/0.3ML IJ SOSY
60.0000 ug | PREFILLED_SYRINGE | INTRAMUSCULAR | Status: DC
  Administered 2016-10-04: 60 ug via SUBCUTANEOUS

## 2016-10-29 ENCOUNTER — Emergency Department (HOSPITAL_COMMUNITY): Payer: Medicare Other

## 2016-10-29 ENCOUNTER — Encounter (HOSPITAL_COMMUNITY): Payer: Self-pay | Admitting: Emergency Medicine

## 2016-10-29 ENCOUNTER — Observation Stay (HOSPITAL_COMMUNITY)
Admission: EM | Admit: 2016-10-29 | Discharge: 2016-10-30 | Disposition: A | Discharge status: Home / Self Care | Payer: Medicare Other | Attending: Family Medicine | Admitting: Family Medicine

## 2016-10-29 DIAGNOSIS — N400 Enlarged prostate without lower urinary tract symptoms: Secondary | ICD-10-CM | POA: Insufficient documentation

## 2016-10-29 DIAGNOSIS — I6523 Occlusion and stenosis of bilateral carotid arteries: Secondary | ICD-10-CM | POA: Diagnosis not present

## 2016-10-29 DIAGNOSIS — E119 Type 2 diabetes mellitus without complications: Secondary | ICD-10-CM

## 2016-10-29 DIAGNOSIS — E785 Hyperlipidemia, unspecified: Secondary | ICD-10-CM | POA: Insufficient documentation

## 2016-10-29 DIAGNOSIS — N179 Acute kidney failure, unspecified: Secondary | ICD-10-CM | POA: Diagnosis not present

## 2016-10-29 DIAGNOSIS — E11649 Type 2 diabetes mellitus with hypoglycemia without coma: Secondary | ICD-10-CM | POA: Diagnosis not present

## 2016-10-29 DIAGNOSIS — Z794 Long term (current) use of insulin: Secondary | ICD-10-CM | POA: Diagnosis not present

## 2016-10-29 DIAGNOSIS — Z79899 Other long term (current) drug therapy: Secondary | ICD-10-CM | POA: Insufficient documentation

## 2016-10-29 DIAGNOSIS — I6629 Occlusion and stenosis of unspecified posterior cerebral artery: Secondary | ICD-10-CM | POA: Insufficient documentation

## 2016-10-29 DIAGNOSIS — Z8673 Personal history of transient ischemic attack (TIA), and cerebral infarction without residual deficits: Secondary | ICD-10-CM | POA: Insufficient documentation

## 2016-10-29 DIAGNOSIS — I639 Cerebral infarction, unspecified: Secondary | ICD-10-CM | POA: Diagnosis not present

## 2016-10-29 DIAGNOSIS — Z7982 Long term (current) use of aspirin: Secondary | ICD-10-CM | POA: Insufficient documentation

## 2016-10-29 DIAGNOSIS — F329 Major depressive disorder, single episode, unspecified: Secondary | ICD-10-CM | POA: Diagnosis not present

## 2016-10-29 DIAGNOSIS — R42 Dizziness and giddiness: Principal | ICD-10-CM

## 2016-10-29 DIAGNOSIS — I2510 Atherosclerotic heart disease of native coronary artery without angina pectoris: Secondary | ICD-10-CM | POA: Diagnosis not present

## 2016-10-29 DIAGNOSIS — I132 Hypertensive heart and chronic kidney disease with heart failure and with stage 5 chronic kidney disease, or end stage renal disease: Secondary | ICD-10-CM | POA: Diagnosis not present

## 2016-10-29 DIAGNOSIS — I679 Cerebrovascular disease, unspecified: Secondary | ICD-10-CM | POA: Diagnosis present

## 2016-10-29 DIAGNOSIS — E1122 Type 2 diabetes mellitus with diabetic chronic kidney disease: Secondary | ICD-10-CM | POA: Diagnosis not present

## 2016-10-29 DIAGNOSIS — I5032 Chronic diastolic (congestive) heart failure: Secondary | ICD-10-CM | POA: Insufficient documentation

## 2016-10-29 DIAGNOSIS — N184 Chronic kidney disease, stage 4 (severe): Secondary | ICD-10-CM | POA: Diagnosis present

## 2016-10-29 DIAGNOSIS — R262 Difficulty in walking, not elsewhere classified: Secondary | ICD-10-CM | POA: Diagnosis not present

## 2016-10-29 DIAGNOSIS — G4733 Obstructive sleep apnea (adult) (pediatric): Secondary | ICD-10-CM | POA: Insufficient documentation

## 2016-10-29 DIAGNOSIS — G40909 Epilepsy, unspecified, not intractable, without status epilepticus: Secondary | ICD-10-CM | POA: Insufficient documentation

## 2016-10-29 DIAGNOSIS — N185 Chronic kidney disease, stage 5: Secondary | ICD-10-CM | POA: Diagnosis not present

## 2016-10-29 DIAGNOSIS — Z89512 Acquired absence of left leg below knee: Secondary | ICD-10-CM | POA: Diagnosis not present

## 2016-10-29 DIAGNOSIS — Z89421 Acquired absence of other right toe(s): Secondary | ICD-10-CM | POA: Insufficient documentation

## 2016-10-29 DIAGNOSIS — I10 Essential (primary) hypertension: Secondary | ICD-10-CM | POA: Diagnosis not present

## 2016-10-29 DIAGNOSIS — E1151 Type 2 diabetes mellitus with diabetic peripheral angiopathy without gangrene: Secondary | ICD-10-CM | POA: Insufficient documentation

## 2016-10-29 DIAGNOSIS — N183 Chronic kidney disease, stage 3 (moderate): Secondary | ICD-10-CM | POA: Diagnosis not present

## 2016-10-29 LAB — LIPID PANEL
CHOLESTEROL: 181 mg/dL (ref 0–200)
HDL: 58 mg/dL (ref >40)
LDL Cholesterol: 94 mg/dL (ref 0–99)
Total CHOL/HDL Ratio: 3.1 RATIO
Triglycerides: 145 mg/dL (ref <150)
VLDL: 29 mg/dL (ref 0–40)

## 2016-10-29 LAB — URINALYSIS, ROUTINE W REFLEX MICROSCOPIC
Bilirubin Urine: NEGATIVE
Glucose, UA: NEGATIVE mg/dL
Hgb urine dipstick: NEGATIVE
KETONES UR: NEGATIVE mg/dL
LEUKOCYTES UA: NEGATIVE
Nitrite: NEGATIVE
PH: 7 (ref 5.0–8.0)
Protein, ur: 100 mg/dL — AB
SQUAMOUS EPITHELIAL / LPF: NONE SEEN
Specific Gravity, Urine: 1.011 (ref 1.005–1.030)

## 2016-10-29 LAB — CBC
HEMATOCRIT: 36.8 % — AB (ref 39.0–52.0)
Hemoglobin: 12.2 g/dL — ABNORMAL LOW (ref 13.0–17.0)
MCH: 29 pg (ref 26.0–34.0)
MCHC: 33.2 g/dL (ref 30.0–36.0)
MCV: 87.6 fL (ref 78.0–100.0)
Platelets: 150 10*3/uL (ref 150–400)
RBC: 4.2 MIL/uL — ABNORMAL LOW (ref 4.22–5.81)
RDW: 12.7 % (ref 11.5–15.5)
WBC: 6.6 10*3/uL (ref 4.0–10.5)

## 2016-10-29 LAB — CBG MONITORING, ED
Glucose-Capillary: 110 mg/dL — ABNORMAL HIGH (ref 65–99)
Glucose-Capillary: 113 mg/dL — ABNORMAL HIGH (ref 65–99)

## 2016-10-29 LAB — BASIC METABOLIC PANEL
Anion gap: 10 (ref 5–15)
BUN: 75 mg/dL — AB (ref 6–20)
CALCIUM: 9.3 mg/dL (ref 8.9–10.3)
CO2: 24 mmol/L (ref 22–32)
CREATININE: 3.54 mg/dL — AB (ref 0.61–1.24)
Chloride: 107 mmol/L (ref 101–111)
GFR calc Af Amer: 19 mL/min — ABNORMAL LOW (ref >60)
GFR calc non Af Amer: 16 mL/min — ABNORMAL LOW (ref >60)
GLUCOSE: 118 mg/dL — AB (ref 65–99)
Potassium: 4.4 mmol/L (ref 3.5–5.1)
SODIUM: 141 mmol/L (ref 135–145)

## 2016-10-29 LAB — GLUCOSE, CAPILLARY
GLUCOSE-CAPILLARY: 134 mg/dL — AB (ref 65–99)
GLUCOSE-CAPILLARY: 167 mg/dL — AB (ref 65–99)

## 2016-10-29 MED ORDER — INSULIN DETEMIR 100 UNIT/ML ~~LOC~~ SOLN
10.0000 [IU] | Freq: Once | SUBCUTANEOUS | Status: AC
  Administered 2016-10-29: 10 [IU] via SUBCUTANEOUS
  Filled 2016-10-29: qty 0.1

## 2016-10-29 MED ORDER — TAMSULOSIN HCL 0.4 MG PO CAPS
0.4000 mg | ORAL_CAPSULE | Freq: Every day | ORAL | Status: DC
  Administered 2016-10-29 – 2016-10-30 (×2): 0.4 mg via ORAL
  Filled 2016-10-29 (×2): qty 1

## 2016-10-29 MED ORDER — DOCUSATE SODIUM 100 MG PO CAPS
100.0000 mg | ORAL_CAPSULE | Freq: Two times a day (BID) | ORAL | Status: DC
  Administered 2016-10-29 – 2016-10-30 (×3): 100 mg via ORAL
  Filled 2016-10-29 (×3): qty 1

## 2016-10-29 MED ORDER — ACETAMINOPHEN 650 MG RE SUPP: 650.0000 mg | RECTAL | Status: DC | PRN

## 2016-10-29 MED ORDER — ACETAMINOPHEN 160 MG/5ML PO SOLN: 650.0000 mg | ORAL | Status: DC | PRN

## 2016-10-29 MED ORDER — LATANOPROST 0.005 % OP SOLN
1.0000 [drp] | Freq: Every day | OPHTHALMIC | Status: DC
  Administered 2016-10-29: 1 [drp] via OPHTHALMIC
  Filled 2016-10-29: qty 2.5

## 2016-10-29 MED ORDER — INSULIN ASPART 100 UNIT/ML ~~LOC~~ SOLN
0.0000 [IU] | Freq: Three times a day (TID) | SUBCUTANEOUS | Status: DC
  Administered 2016-10-30: 2 [IU] via SUBCUTANEOUS

## 2016-10-29 MED ORDER — LAMOTRIGINE 100 MG PO TABS
100.0000 mg | ORAL_TABLET | Freq: Two times a day (BID) | ORAL | Status: DC
  Administered 2016-10-29 – 2016-10-30 (×3): 100 mg via ORAL
  Filled 2016-10-29 (×3): qty 1

## 2016-10-29 MED ORDER — SODIUM CHLORIDE 0.9 % IV SOLN: INTRAVENOUS | Status: DC

## 2016-10-29 MED ORDER — ASPIRIN 325 MG PO TABS
325.0000 mg | ORAL_TABLET | Freq: Every day | ORAL | Status: DC
  Administered 2016-10-29 – 2016-10-30 (×2): 325 mg via ORAL
  Filled 2016-10-29 (×2): qty 1

## 2016-10-29 MED ORDER — TIMOLOL HEMIHYDRATE 0.5 % OP SOLN: 1.0000 [drp] | Freq: Two times a day (BID) | OPHTHALMIC | Status: DC

## 2016-10-29 MED ORDER — PREGABALIN 25 MG PO CAPS
75.0000 mg | ORAL_CAPSULE | Freq: Two times a day (BID) | ORAL | Status: DC
  Administered 2016-10-29 – 2016-10-30 (×3): 75 mg via ORAL
  Filled 2016-10-29 (×3): qty 3

## 2016-10-29 MED ORDER — ACETAMINOPHEN 325 MG PO TABS: 650.0000 mg | ORAL_TABLET | ORAL | Status: DC | PRN

## 2016-10-29 MED ORDER — STROKE: EARLY STAGES OF RECOVERY BOOK
Freq: Once | Status: DC
  Filled 2016-10-29: qty 1

## 2016-10-29 MED ORDER — ENOXAPARIN SODIUM 30 MG/0.3ML ~~LOC~~ SOLN
30.0000 mg | SUBCUTANEOUS | Status: DC
  Administered 2016-10-29: 30 mg via SUBCUTANEOUS
  Filled 2016-10-29: qty 0.3

## 2016-10-29 MED ORDER — TIMOLOL MALEATE 0.5 % OP SOLN
1.0000 [drp] | Freq: Two times a day (BID) | OPHTHALMIC | Status: DC
  Administered 2016-10-29 – 2016-10-30 (×2): 1 [drp] via OPHTHALMIC
  Filled 2016-10-29: qty 5

## 2016-10-29 MED ORDER — VENLAFAXINE HCL ER 75 MG PO CP24
75.0000 mg | ORAL_CAPSULE | Freq: Every day | ORAL | Status: DC
  Administered 2016-10-29 – 2016-10-30 (×2): 75 mg via ORAL
  Filled 2016-10-29 (×2): qty 1
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
## 2016-10-29 NOTE — Progress Notes (Signed)
Patient passed swallow test, MD notified. Will continue to monitor.

## 2016-10-29 NOTE — ED Notes (Signed)
Pt c/o has been dizzy with any movement and leaning to the left-- felt fine when laying down, when pt sat up, started leaning to the left when he tried to stand up-- noticed this at 3am, -- ambulated to the bathroom at that time.  This am, tried to stand up, but was leaning to left per wife.

## 2016-10-29 NOTE — ED Triage Notes (Signed)
Pt states he woke up this morning and felt dizzy. States when hes sitting in a chair, he feels fine, but when he gets up he feels like he leans to the left. Pt drove himself here, pt has no weakness, no slurred speech, no drift, no facial droop. Denies pain.
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
## 2016-10-29 NOTE — ED Notes (Signed)
Patient transported to MRI 

## 2016-10-29 NOTE — Progress Notes (Signed)
Patient arrived to unit, denies pain, states he came to ED because "I was leaning left" Family at bedside-will continue to monitor
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
## 2016-10-29 NOTE — ED Notes (Signed)
Returned from MRI 

## 2016-10-29 NOTE — ED Notes (Signed)
Attempted blood draw, and was unsuccessful.  Pt states "they always have a hard time finding my veins."  Informed Otila Kluver, RN.

## 2016-10-29 NOTE — ED Notes (Signed)
Pt's CBG 110.  Informed Otila Kluver, RN.

## 2016-10-30 ENCOUNTER — Inpatient Hospital Stay (HOSPITAL_BASED_OUTPATIENT_CLINIC_OR_DEPARTMENT_OTHER): Payer: Medicare Other

## 2016-10-30 ENCOUNTER — Encounter (HOSPITAL_COMMUNITY): Payer: Self-pay | Admitting: *Deleted

## 2016-10-30 DIAGNOSIS — N183 Chronic kidney disease, stage 3 (moderate): Secondary | ICD-10-CM

## 2016-10-30 DIAGNOSIS — I5032 Chronic diastolic (congestive) heart failure: Secondary | ICD-10-CM

## 2016-10-30 DIAGNOSIS — I10 Essential (primary) hypertension: Secondary | ICD-10-CM | POA: Diagnosis not present

## 2016-10-30 DIAGNOSIS — E1122 Type 2 diabetes mellitus with diabetic chronic kidney disease: Secondary | ICD-10-CM | POA: Diagnosis not present

## 2016-10-30 DIAGNOSIS — I679 Cerebrovascular disease, unspecified: Secondary | ICD-10-CM | POA: Diagnosis present

## 2016-10-30 DIAGNOSIS — I639 Cerebral infarction, unspecified: Secondary | ICD-10-CM

## 2016-10-30 DIAGNOSIS — R42 Dizziness and giddiness: Secondary | ICD-10-CM | POA: Diagnosis not present

## 2016-10-30 DIAGNOSIS — Z794 Long term (current) use of insulin: Secondary | ICD-10-CM

## 2016-10-30 LAB — BASIC METABOLIC PANEL
Anion gap: 7 (ref 5–15)
BUN: 60 mg/dL — AB (ref 6–20)
CALCIUM: 9.2 mg/dL (ref 8.9–10.3)
CO2: 26 mmol/L (ref 22–32)
Chloride: 111 mmol/L (ref 101–111)
Creatinine, Ser: 2.92 mg/dL — ABNORMAL HIGH (ref 0.61–1.24)
GFR calc Af Amer: 24 mL/min — ABNORMAL LOW (ref >60)
GFR, EST NON AFRICAN AMERICAN: 20 mL/min — AB (ref >60)
GLUCOSE: 61 mg/dL — AB (ref 65–99)
POTASSIUM: 4.1 mmol/L (ref 3.5–5.1)
Sodium: 144 mmol/L (ref 135–145)

## 2016-10-30 LAB — GLUCOSE, CAPILLARY
GLUCOSE-CAPILLARY: 99 mg/dL (ref 65–99)
GLUCOSE-CAPILLARY: 123 mg/dL — AB (ref 65–99)
Glucose-Capillary: 50 mg/dL — ABNORMAL LOW (ref 65–99)
Glucose-Capillary: 79 mg/dL (ref 65–99)

## 2016-10-30 LAB — ECHOCARDIOGRAM COMPLETE
AVLVOTPG: 4 mmHg
CHL CUP MV DEC (S): 187
E/e' ratio: 8.97
EWDT: 187 ms
FS: 35 % (ref 28–44)
Height: 70 in
IV/PV OW: 0.9
LA ID, A-P, ES: 38 mm
LA diam index: 1.96 cm/m2
LA vol index: 31.2 mL/m2
LAVOL: 60.6 mL
LAVOLA4C: 51 mL
LEFT ATRIUM END SYS DIAM: 38 mm
LV E/e' medial: 8.97
LV E/e'average: 8.97
LV PW d: 10 mm — AB (ref 0.6–1.1)
LV e' LATERAL: 9.76 cm/s
LVOT SV: 98 mL
LVOT VTI: 23.6 cm
LVOT area: 4.15 cm2
LVOT peak vel: 98.3 cm/s
LVOTD: 23 mm
Lateral S' vel: 13.6 cm/s
MV Peak grad: 3 mmHg
MVPKAVEL: 99 m/s
MVPKEVEL: 87.5 m/s
TAPSE: 31.1 mm
TDI e' lateral: 9.76
TDI e' medial: 7.93
Weight: 2704 oz

## 2016-10-30 MED ORDER — CLOPIDOGREL BISULFATE 75 MG PO TABS: 75.0000 mg | ORAL_TABLET | Freq: Every day | ORAL | 0 refills | Status: DC

## 2016-10-30 MED ORDER — DEXTROSE 50 % IV SOLN
INTRAVENOUS | Status: AC
  Administered 2016-10-30: 50 mL
  Filled 2016-10-30: qty 50

## 2016-10-30 MED ORDER — ISOSORB DINITRATE-HYDRALAZINE 20-37.5 MG PO TABS: 1.0000 | ORAL_TABLET | Freq: Two times a day (BID) | ORAL | Status: DC

## 2016-10-30 MED ORDER — INSULIN DETEMIR 100 UNIT/ML ~~LOC~~ SOLN: 5.0000 [IU] | Freq: Every day | SUBCUTANEOUS | Status: DC

## 2016-10-30 MED ORDER — ATORVASTATIN CALCIUM 10 MG PO TABS: 20.0000 mg | ORAL_TABLET | Freq: Every evening | ORAL | Status: DC

## 2016-10-30 MED ORDER — CARVEDILOL 6.25 MG PO TABS: 6.2500 mg | ORAL_TABLET | Freq: Two times a day (BID) | ORAL | Status: DC

## 2016-10-30 MED ORDER — AMLODIPINE BESYLATE 5 MG PO TABS: 5.0000 mg | ORAL_TABLET | Freq: Every day | ORAL | Status: DC

## 2016-10-30 MED ORDER — CLOPIDOGREL BISULFATE 75 MG PO TABS
75.0000 mg | ORAL_TABLET | Freq: Every day | ORAL | Status: DC
  Administered 2016-10-30: 75 mg via ORAL
  Filled 2016-10-30: qty 1

## 2016-10-30 MED ORDER — ATORVASTATIN CALCIUM 40 MG PO TABS
40.0000 mg | ORAL_TABLET | Freq: Every evening | ORAL | Status: DC
Start: 2016-10-30 — End: 2016-10-30
  Administered 2016-10-30: 40 mg via ORAL
  Filled 2016-10-30: qty 1

## 2016-10-30 MED ORDER — INSULIN DETEMIR 100 UNIT/ML ~~LOC~~ SOLN
5.0000 [IU] | Freq: Every day | SUBCUTANEOUS | Status: DC
  Filled 2016-10-30: qty 0.05

## 2016-10-30 MED ORDER — FUROSEMIDE 40 MG PO TABS: 40.0000 mg | ORAL_TABLET | Freq: Every day | ORAL | Status: DC

## 2016-10-30 NOTE — Progress Notes (Signed)
CBG 50. Administered 25cc of Dextrose 50. Recheck 99. MD made aware.

## 2016-10-30 NOTE — Progress Notes (Signed)
PT Cancellation Note  Patient Details Name: [name] MRN: [id number] DOB: Dec 01, 1945   Cancelled Treatment:    Reason Eval/Treat Not Completed: Other (comment).  Pt just got lunch and is eating.  Also, pt is technically on bedrest, however, MRI is clear, so RN and PT discussed and feel it is appropriate to proceed with evaluation.   PT to check back later today or tomorrow as time allows.   Thanks,    Barbarann Ehlers. [place], [place], DPT [phone]   10/30/2016, 11:30 AM

## 2016-10-30 NOTE — Progress Notes (Signed)
OT Cancellation    10/30/16 0933  OT Visit Information  Last OT Received On 10/30/16  Reason Eval/Treat Not Completed Other (comment) (pt on bedrest)  Cognition  Overall Cognitive Status History of cognitive impairments - at baseline  [facility], OT/L  [phone] 10/30/2016

## 2016-10-30 NOTE — Evaluation (Signed)
Physical Therapy Evaluation Patient Details Name: TANVEER BRAMMER MRN: [id number] DOB: 01-06-1946 Today's Date: 10/30/2016   History of Present Illness  71 y.o. male admitted on 10/29/16 dizziness and difficulty ambulating.  Pt's MRI was negative for acute stroke, but did show severe intracranial atherosclerotic disease, severe stenosis of the PCA and critical stenosis of distal right internal carotid artery.  Pt with significant PMH of stroke, seizure, PVD, PAD, HTN, DM, CKD, combined CHF, anemia, L BKA, and R toe amputations.    Clinical Impression  Pt is mobilizing a little slow are more cautious than normal.  Vestibular testing did not reveal any vesibular system dysfunction.  I did advise him to use his RW for a few days before switching back to his cane. He is close to baseline.  No f/u therapy indicated at this time.  PT to follow acutely until d/c confirmed.       Follow Up Recommendations No PT follow up;Supervision for mobility/OOB    Equipment Recommendations  None recommended by PT    Recommendations for Other Services   NA    Precautions / Restrictions Precautions Precautions: Fall Precaution Comments: Per wife, he is unsteady at baseline Required Braces or Orthoses: Other Brace/Splint Other Brace/Splint: left leg prosthesis.       Mobility  Bed Mobility Overal bed mobility: Needs Assistance Bed Mobility: Supine to Sit     Supine to sit: Min assist     General bed mobility comments: Min hand held assist to pull up to sitting EOB.  Pt with left posterior LOB until he got his right foot on the floor and situated EOB. Pt reported some dizziness with that "fast" transition.   Transfers Overall transfer level: Needs assistance Equipment used: Rolling walker (2 wheeled) Transfers: Sit to/from Stand Sit to Stand: Supervision         General transfer comment: supervision for safety, cues to stand for a minute to assess his dizziness before  proceeding.  Ambulation/Gait Ambulation/Gait assistance: Supervision Ambulation Distance (Feet): 150 Feet Assistive device: Rolling walker (2 wheeled) Gait Pattern/deviations: Step-through pattern;Trunk flexed Gait velocity: decreased Gait velocity interpretation: Below normal speed for age/gender General Gait Details: Verbal cues for upright posture, pt moving slower than usual due to being cautious.  I advised him to use his RW instead of his cane until he feels like he is back to his normal function and not getting dizzy when he gets up and moves around.          Balance Overall balance assessment: Needs assistance Sitting-balance support: Feet supported;No upper extremity supported Sitting balance-Leahy Scale: Fair Sitting balance - Comments: tends to LOB left in sitting while trying to put his prosthesis on EOB.  Postural control: Left lateral lean Standing balance support: Bilateral upper extremity supported Standing balance-Leahy Scale: Fair             10/30/16 1655  Vestibular Assessment  General Observation Pt wears bifocals, sudden onset room spinning dizziness, when he moves fast it brings it on, poor cervical spine mobility, no blurry vision, no double vision, no eye surgery, no h/o head trauma, no recent antibiotic use, no tinnitus or hearing changes.   Symptom Behavior  Type of Dizziness Spinning  Frequency of Dizziness with fast movement  Duration of Dizziness short  Aggravating Factors Turning body quickly  Relieving Factors Lying supine  Occulomotor Exam  Occulomotor Alignment Normal  Spontaneous Absent  Gaze-induced Absent  Smooth Pursuits Intact  Saccades Dysmetria  Vestibulo-Occular Reflex  VOR  1 Head Only (x 1 viewing) normal vertical and horizontal, not symptom provoking.   Positional Testing  Dix-Hallpike Dix-Hallpike Right;Dix-Hallpike Left  Horizontal Canal Testing Horizontal Canal Right;Horizontal Canal Left  Dix-Hallpike Right  Dix-Hallpike  Right Duration 0  Dix-Hallpike Right Symptoms No nystagmus  Dix-Hallpike Left  Dix-Hallpike Left Duration 0  Dix-Hallpike Left Symptoms No nystagmus  Horizontal Canal Right  Horizontal Canal Right Duration 0  Horizontal Canal Right Symptoms Normal  Horizontal Canal Left  Horizontal Canal Left Duration 0  Horizontal Canal Left Symptoms Normal                        Pertinent Vitals/Pain Pain Assessment: No/denies pain    Home Living Family/patient expects to be discharged to:: Private residence Living Arrangements: Spouse/significant other Available Help at Discharge: Family Type of Home: House Home Access: Stairs to enter;Ramped entrance Entrance Stairs-Rails: None Entrance Stairs-Number of Steps: 1 (door threshold) Home Layout: One level Home Equipment: Environmental consultant - 2 wheels;Cane - single point;Wheelchair - manual      Prior Function Level of Independence: Independent with assistive device(s)         Comments: per pt and wife he uses a SPC at baseline, but often leaves it behind (per pt only when he is in a familar environment).      Hand Dominance   Dominant Hand: Right    Extremity/Trunk Assessment   Upper Extremity Assessment Upper Extremity Assessment: Overall WFL for tasks assessed    Lower Extremity Assessment Lower Extremity Assessment: RLE deficits/detail;LLE deficits/detail RLE Deficits / Details: right leg missing most of his toes LLE Deficits / Details: left leg with BKA that is >68 years old    Cervical / Trunk Assessment Cervical / Trunk Assessment: Other exceptions Cervical / Trunk Exceptions: forward head with limited head and neck ROM, pt also with flexed trunk posture during gait.   Communication   Communication: No difficulties  Cognition Arousal/Alertness: Awake/alert Behavior During Therapy: WFL for tasks assessed/performed Overall Cognitive Status: History of cognitive impairments - at baseline                                  General Comments: Seems a bit slow to process, did not check orientation speciifically, but conversation is WNL.              Assessment/Plan    PT Assessment Patient needs continued PT services  PT Problem List Decreased balance;Decreased mobility;Decreased knowledge of use of DME       PT Treatment Interventions DME instruction;Gait training;Stair training;Functional mobility training;Therapeutic activities;Therapeutic exercise;Balance training;Neuromuscular re-education;Patient/family education    PT Goals (Current goals can be found in the Care Plan section)  Acute Rehab PT Goals Patient Stated Goal: to go home today PT Goal Formulation: With patient Time For Goal Achievement: 11/13/16 Potential to Achieve Goals: Good    Frequency Min 3X/week   Barriers to discharge           AM-PAC PT "6 Clicks" Daily Activity  Outcome Measure Difficulty turning over in bed (including adjusting bedclothes, sheets and blankets)?: None Difficulty moving from lying on back to sitting on the side of the bed? : Total Difficulty sitting down on and standing up from a chair with arms (e.g., wheelchair, bedside commode, etc,.)?: None Help needed moving to and from a bed to chair (including a wheelchair)?: None Help needed walking in hospital room?: None Help  needed climbing 3-5 steps with a railing? : A Little 6 Click Score: 20    End of Session Equipment Utilized During Treatment: Gait belt Activity Tolerance: Patient tolerated treatment well Patient left: in bed;with call bell/phone within reach;with family/visitor present Nurse Communication: Mobility status PT Visit Diagnosis: Unsteadiness on feet (R26.81);Other symptoms and signs involving the nervous system (R29.898);Dizziness and giddiness (R42)    Time: 8841-6606 PT Time Calculation (min) (ACUTE ONLY): 48 min   Charges:   PT Evaluation $PT Eval Moderate Complexity: 1 Mod PT Treatments $Gait Training: 8-22  mins $Therapeutic Activity: 8-22 mins   PT G Codes:   PT G-Codes **NOT FOR INPATIENT CLASS** Functional Assessment Tool Used: AM-PAC 6 Clicks Basic Mobility Functional Limitation: Mobility: Walking and moving around Mobility: Walking and Moving Around Current Status (T0160): At least 20 percent but less than 40 percent impaired, limited or restricted Mobility: Walking and Moving Around Goal Status [phone]): At least 1 percent but less than 20 percent impaired, limited or restricted   Brenton Joines B. [place], [place], DPT [phone]   10/30/2016, 4:57 PM

## 2016-10-30 NOTE — Discharge Summary (Signed)
Physician Discharge Summary  [name] OAC:[id number] DOB: 11-14-1945 DOA: 10/29/2016  PCP: Anda Kraft, MD  Admit date: 10/29/2016 Discharge date: 10/30/2016  Admitted From: Home Disposition: Home  Recommendations for Outpatient Follow-up:  1. Follow up with PCP in 1 week 2. Please obtain BMP in one week 3. Please follow up on the following pending results: None  Home Health: None Equipment/Devices: None  Discharge Condition: Stable CODE STATUS: Full code Diet recommendation: Heart healthy/carb modified   Brief/Interim Summary:  Admission HPI written by Gwynne Edinger, MD   Chief Complaint: difficulty ambulating  HPI: [name] is a 71 y.o. male with medical history significant of remote CVA with no residual deficits, seizure disorder (years since last seizure), pad, htn, CKD 4/5 (with plans for dialysis), chf, DM, who presents for difficulty ambulating.  Was in usual state of health last night when went to bed. No recent illness, no fever or cough or chest pain or sob. Awoke middle of night to use restroom and felt more dizzy than normal, felt was leaning to left, and had trouble making it to bathroom, but succeeded and returned to bed. Symptoms persisted on awakening this morning. Denies weakness in a specific extremity. No difficulty speaking or swallowing. No recent med changes. No room spinning. No vomiting or diarrhea. PO good yesterday, hasn't eaten today. No headache. Hasn't taken today's medications  ED Course: CT head   Hospital course:  Lightheadedness MRI/MRA significant for no stroke, but significant for severe intracranial atherosclerotic disease, severe stenosis of PCA and critical stenosis of distal right internal carotid artery. Aspirin and statin given. LDL of 94 with hemoglobin A1C of 6.4. Echocardiogram obtained with EF of 50-55% and grade 1 diastolic dysfunction. Neurology recommending dual antiplatelet therapy and some leniency on blood  pressure control in the setting of stenosis.  Essential hypertension Permissive hypertension initially but will resume most antihypertensives. Discontinue amlodipine and lower to furosemide 40mg .  Seizure disorder Stable. Continued lamotrigine and Lyrica.  Diabetes mellitus Hypoglycemia overnight. Decreased Levemir to 5 units on discharge.  CKD with AKI Baseline of 2.5 to 3. Improved with IV fluids.  Chronic diastolic heart failure Held antihypertensives secondary to concern for stroke. Echocardiogram obtained with EF of 50-55% and grade 1 diastolic dysfunction.  Discharge Diagnoses:  Active Problems:   DM2 (diabetes mellitus, type 2) (HCC)   Hypertension   CKD (chronic kidney disease) stage 3, GFR 30-59 ml/min   Chronic diastolic heart failure ([place])   Lightheadedness    Discharge Instructions  Discharge Instructions    Call MD for:  extreme fatigue    Complete by:  As directed    Call MD for:  persistant dizziness or light-headedness    Complete by:  As directed    Call MD for:  persistant nausea and vomiting    Complete by:  As directed    Call MD for:  severe uncontrolled pain    Complete by:  As directed    Call MD for:  temperature >100.4    Complete by:  As directed    Diet - low sodium heart healthy    Complete by:  As directed    Increase activity slowly    Complete by:  As directed      Allergies as of 10/30/2016      Reactions   Codeine Anaphylaxis   Penicillins Anaphylaxis   PATIENT HAD A PCN REACTION WITH IMMEDIATE RASH, FACIAL/TONGUE/THROAT SWELLING, SOB, OR LIGHTHEADEDNESS WITH HYPOTENSION:  #  #  #  YES  #  #  #  Has patient had a PCN reaction causing severe rash involving mucus membranes or skin necrosis: No Has patient had a PCN reaction that required hospitalization: Unknown Has patient had a PCN reaction occurring within the last 10 years: No If all of the above answers are "NO", then may proceed with Cephalosporin use.      Medication List     STOP taking these medications   amLODipine 5 MG tablet Commonly known as:  NORVASC     TAKE these medications   ASPERCREME LIDOCAINE EX Apply 1 application topically daily as needed (pain).   aspirin 325 MG tablet Take 325 mg by mouth daily.   atorvastatin 20 MG tablet Commonly known as:  LIPITOR Take 20 mg by mouth every evening.   carvedilol 6.25 MG tablet Commonly known as:  COREG Take 1 tablet (6.25 mg total) by mouth 2 (two) times daily with a meal.   CENTRUM SILVER ADULT 50+ PO Take 1 tablet by mouth daily.   clopidogrel 75 MG tablet Commonly known as:  PLAVIX Take 1 tablet (75 mg total) by mouth daily.   docusate sodium 100 MG capsule Commonly known as:  COLACE Take 1 capsule (100 mg total) by mouth 2 (two) times daily.   furosemide 40 MG tablet Commonly known as:  LASIX Take 1 tablet (40 mg total) by mouth daily. What changed:  how much to take   insulin detemir 100 UNIT/ML injection Commonly known as:  LEVEMIR Inject 0.05 mLs (5 Units total) into the skin at bedtime. What changed:  how much to take   isosorbide-hydrALAZINE 20-37.5 MG tablet Commonly known as:  BIDIL Take 1 tablet by mouth 2 (two) times daily.   lamoTRIgine 100 MG tablet Commonly known as:  LAMICTAL Take 1 tablet (100 mg total) by mouth 2 (two) times daily.   LYRICA 75 MG capsule Generic drug:  pregabalin Take 75 mg by mouth 2 (two) times daily.   memantine 10 MG tablet Commonly known as:  NAMENDA Take 1 tablet (10 mg total) by mouth 2 (two) times daily.   ONE TOUCH ULTRA TEST test strip Generic drug:  glucose blood   polyethylene glycol packet Commonly known as:  MIRALAX / GLYCOLAX Take 17 g by mouth daily as needed for mild constipation.   silver sulfADIAZINE 1 % cream Commonly known as:  SILVADENE Apply 1 application topically daily as needed (sores).   tamsulosin 0.4 MG Caps capsule Commonly known as:  FLOMAX Take 1 capsule (0.4 mg total) by mouth daily after  supper.   timolol 0.5 % ophthalmic solution Commonly known as:  BETIMOL Place 1 drop into both eyes 2 (two) times daily.   Travoprost (BAK Free) 0.004 % Soln ophthalmic solution Commonly known as:  [name] Place 1 drop into both eyes at bedtime.   venlafaxine XR 75 MG 24 hr capsule Commonly known as:  EFFEXOR-XR Take 75 mg by mouth daily.   vitamin C 500 MG tablet Commonly known as:  ASCORBIC ACID Take 500 mg by mouth daily.      Follow-up Information    Anda Kraft, MD. Schedule an appointment as soon as possible for a visit in 1 week(s).   Specialty:  Endocrinology Contact information: [address] [place] [address] [phone]        Kathrynn Ducking, MD. Schedule an appointment as soon as possible for a visit in 2 week(s).   Specialty:  Neurology Contact information: [phone]  [place] [address] [phone]          Allergies  Allergen Reactions  . Codeine Anaphylaxis  . Penicillins Anaphylaxis     PATIENT HAD A PCN REACTION WITH IMMEDIATE RASH, FACIAL/TONGUE/THROAT SWELLING, SOB, OR LIGHTHEADEDNESS WITH HYPOTENSION:  #  #  #  YES  #  #  #  Has patient had a PCN reaction causing severe rash involving mucus membranes or skin necrosis: No Has patient had a PCN reaction that required hospitalization: Unknown Has patient had a PCN reaction occurring within the last 10 years: No If all of the above answers are "NO", then may proceed with Cephalosporin use.     Consultations:  Neurology   Procedures/Studies: Ct Head Wo Contrast  Result Date: 10/29/2016 CLINICAL DATA:  Dyspnea while standing, feels like he is leaning to the LEFT, onset of symptoms this morning, history hypertension, type II diabetes mellitus, peripheral arterial disease, stroke, seizures EXAM: CT HEAD WITHOUT CONTRAST TECHNIQUE: Contiguous axial images were obtained from the base of the skull through the vertex without intravenous contrast.  Sagittal and coronal MPR images reconstructed from axial data set. COMPARISON:  07/02/2015 FINDINGS: Brain: Generalized atrophy. Normal ventricular morphology. No midline shift or mass effect. Old LEFT occipital infarct. Small vessel chronic ischemic changes of deep cerebral white matter. Old lacunar infarct at anterior limb RIGHT internal capsule and anterior RIGHT basal ganglia. No intracranial hemorrhage, mass lesion, or evidence of acute infarction. No extra-axial fluid collections. Vascular: Atherosclerotic calcification of internal carotid arteries bilaterally at skullbase as well as to a lesser degree in the vertebral arteries Skull: Intact, unremarkable Sinuses/Orbits: Clear Other: N/A IMPRESSION: Atrophy with small vessel chronic ischemic changes of deep cerebral white matter. Old infarcts LEFT occipital and RIGHT anterior limb internal capsule/anterior RIGHT basal ganglia. No acute intracranial abnormalities. Electronically Signed   By: Lavonia Dana M.D.   On: 10/29/2016 10:39   Mr Jodene Nam Head Wo Contrast  Addendum Date: 10/29/2016   ADDENDUM REPORT: 10/29/2016 19:43 ADDENDUM: Correction: The patient did not receive intravenous contrast due to renal insufficiency. MRA neck was done without contrast Electronically Signed   By: Franchot Gallo M.D.   On: 10/29/2016 19:43   Result Date: 10/29/2016 CLINICAL DATA:  Dizziness EXAM: MR HEAD WITHOUT CONTRAST MR CIRCLE OF [name] WITHOUT CONTRAST MRA OF THE NECK WITHOUT AND WITH CONTRAST TECHNIQUE: Multiplanar, multiecho pulse sequences of the brain, circle of [name] and surrounding structures were obtained without intravenous contrast. Angiographic images of the neck were obtained using MRA technique without and with intravenous contrast. CONTRAST:  20 mL MultiHance IV COMPARISON:  CT head 10/29/2016 FINDINGS: MR HEAD FINDINGS Brain: Moderate atrophy. Extensive chronic ischemic change including diffuse chronic microvascular ischemia throughout the white matter.  Chronic infarct right basal ganglia. Chronic infarcts in the left thalamus. Chronic infarct left occipital lobe and left cerebellum. Chronic ischemic change in the pons. Negative for acute infarct. Negative for hemorrhage or mass. Negative for hydrocephalus. Vascular: Normal arterial flow void Skull and upper cervical spine: Negative Sinuses/Orbits: Mild mucosal edema paranasal sinuses. Bilateral cataract removal. Other: None MR CIRCLE OF [name] FINDINGS Both vertebral arteries patent to the basilar. PICA patent bilaterally. Basilar widely patent. Severe stenosis in the posterior cerebral artery bilaterally. Superior cerebellar artery patent bilaterally Severe stenosis at the distal right internal carotid artery. Hypoplastic right A1 segment. Right middle cerebral artery supplied primarily by fetal right posterior cerebral artery. Moderate disease in right M2 segments. Mild atherosclerotic disease left cavernous carotid. Mild  disease left M1 segment. No branch occlusion MRA NECK FINDINGS Antegrade flow in and carotid and vertebral artery bilaterally. Carotid bifurcation is normal bilaterally. Moderate stenosis of the left internal carotid artery at the skullbase. Right internal carotid artery patent through the skullbase. Both vertebral arteries are patent to the basilar. Right vertebral dominant. No significant vertebral stenosis IMPRESSION: No acute infarct. Moderate atrophy and extensive chronic ischemic changes Severe intracranial atherosclerotic disease. Severe stenosis in the posterior cerebral artery. Critical stenosis distal right internal carotid artery. Moderate disease right M2 segments. Mild atherosclerotic disease left internal carotid artery and left middle cerebral artery branches Carotid bifurcation widely patent bilaterally. Moderate stenosis left internal carotid artery at the skullbase. Both vertebral arteries widely patent. Electronically Signed: By: Franchot Gallo M.D. On: 10/29/2016 14:21    Mr Jodene Nam Neck Wo Contrast  Addendum Date: 10/29/2016   ADDENDUM REPORT: 10/29/2016 19:43 ADDENDUM: Correction: The patient did not receive intravenous contrast due to renal insufficiency. MRA neck was done without contrast Electronically Signed   By: Franchot Gallo M.D.   On: 10/29/2016 19:43   Result Date: 10/29/2016 CLINICAL DATA:  Dizziness EXAM: MR HEAD WITHOUT CONTRAST MR CIRCLE OF [name] WITHOUT CONTRAST MRA OF THE NECK WITHOUT AND WITH CONTRAST TECHNIQUE: Multiplanar, multiecho pulse sequences of the brain, circle of [name] and surrounding structures were obtained without intravenous contrast. Angiographic images of the neck were obtained using MRA technique without and with intravenous contrast. CONTRAST:  20 mL MultiHance IV COMPARISON:  CT head 10/29/2016 FINDINGS: MR HEAD FINDINGS Brain: Moderate atrophy. Extensive chronic ischemic change including diffuse chronic microvascular ischemia throughout the white matter. Chronic infarct right basal ganglia. Chronic infarcts in the left thalamus. Chronic infarct left occipital lobe and left cerebellum. Chronic ischemic change in the pons. Negative for acute infarct. Negative for hemorrhage or mass. Negative for hydrocephalus. Vascular: Normal arterial flow void Skull and upper cervical spine: Negative Sinuses/Orbits: Mild mucosal edema paranasal sinuses. Bilateral cataract removal. Other: None MR CIRCLE OF [name] FINDINGS Both vertebral arteries patent to the basilar. PICA patent bilaterally. Basilar widely patent. Severe stenosis in the posterior cerebral artery bilaterally. Superior cerebellar artery patent bilaterally Severe stenosis at the distal right internal carotid artery. Hypoplastic right A1 segment. Right middle cerebral artery supplied primarily by fetal right posterior cerebral artery. Moderate disease in right M2 segments. Mild atherosclerotic disease left cavernous carotid. Mild disease left M1 segment. No branch occlusion MRA NECK FINDINGS  Antegrade flow in and carotid and vertebral artery bilaterally. Carotid bifurcation is normal bilaterally. Moderate stenosis of the left internal carotid artery at the skullbase. Right internal carotid artery patent through the skullbase. Both vertebral arteries are patent to the basilar. Right vertebral dominant. No significant vertebral stenosis IMPRESSION: No acute infarct. Moderate atrophy and extensive chronic ischemic changes Severe intracranial atherosclerotic disease. Severe stenosis in the posterior cerebral artery. Critical stenosis distal right internal carotid artery. Moderate disease right M2 segments. Mild atherosclerotic disease left internal carotid artery and left middle cerebral artery branches Carotid bifurcation widely patent bilaterally. Moderate stenosis left internal carotid artery at the skullbase. Both vertebral arteries widely patent. Electronically Signed: By: Franchot Gallo M.D. On: 10/29/2016 14:21   Mr Brain Wo Contrast  Addendum Date: 10/29/2016   ADDENDUM REPORT: 10/29/2016 19:43 ADDENDUM: Correction: The patient did not receive intravenous contrast due to renal insufficiency. MRA neck was done without contrast Electronically Signed   By: Franchot Gallo M.D.   On: 10/29/2016 19:43   Result Date: 10/29/2016 CLINICAL DATA:  Dizziness EXAM:  MR HEAD WITHOUT CONTRAST MR CIRCLE OF [name] WITHOUT CONTRAST MRA OF THE NECK WITHOUT AND WITH CONTRAST TECHNIQUE: Multiplanar, multiecho pulse sequences of the brain, circle of [name] and surrounding structures were obtained without intravenous contrast. Angiographic images of the neck were obtained using MRA technique without and with intravenous contrast. CONTRAST:  20 mL MultiHance IV COMPARISON:  CT head 10/29/2016 FINDINGS: MR HEAD FINDINGS Brain: Moderate atrophy. Extensive chronic ischemic change including diffuse chronic microvascular ischemia throughout the white matter. Chronic infarct right basal ganglia. Chronic infarcts in the left  thalamus. Chronic infarct left occipital lobe and left cerebellum. Chronic ischemic change in the pons. Negative for acute infarct. Negative for hemorrhage or mass. Negative for hydrocephalus. Vascular: Normal arterial flow void Skull and upper cervical spine: Negative Sinuses/Orbits: Mild mucosal edema paranasal sinuses. Bilateral cataract removal. Other: None MR CIRCLE OF [name] FINDINGS Both vertebral arteries patent to the basilar. PICA patent bilaterally. Basilar widely patent. Severe stenosis in the posterior cerebral artery bilaterally. Superior cerebellar artery patent bilaterally Severe stenosis at the distal right internal carotid artery. Hypoplastic right A1 segment. Right middle cerebral artery supplied primarily by fetal right posterior cerebral artery. Moderate disease in right M2 segments. Mild atherosclerotic disease left cavernous carotid. Mild disease left M1 segment. No branch occlusion MRA NECK FINDINGS Antegrade flow in and carotid and vertebral artery bilaterally. Carotid bifurcation is normal bilaterally. Moderate stenosis of the left internal carotid artery at the skullbase. Right internal carotid artery patent through the skullbase. Both vertebral arteries are patent to the basilar. Right vertebral dominant. No significant vertebral stenosis IMPRESSION: No acute infarct. Moderate atrophy and extensive chronic ischemic changes Severe intracranial atherosclerotic disease. Severe stenosis in the posterior cerebral artery. Critical stenosis distal right internal carotid artery. Moderate disease right M2 segments. Mild atherosclerotic disease left internal carotid artery and left middle cerebral artery branches Carotid bifurcation widely patent bilaterally. Moderate stenosis left internal carotid artery at the skullbase. Both vertebral arteries widely patent. Electronically Signed: By: Franchot Gallo M.D. On: 10/29/2016 14:21    Echocardiogram (10/30/2016)  Study Conclusions  - Left  ventricle: The cavity size was normal. Wall thickness was   normal. Systolic function was normal. The estimated ejection   fraction was in the range of 50% to 55%. Wall motion was normal;   there were no regional wall motion abnormalities. Doppler   parameters are consistent with abnormal left ventricular   relaxation (grade 1 diastolic dysfunction). - Left atrium: The atrium was mildly dilated.  Impressions:  - Normal LV systolic function; mild diastolic dysfunction; mild   LAE.   Subjective: No issues overnight  Discharge Exam: Vitals:   10/30/16 0911 10/30/16 1334  BP: (!) 166/68 (!) 152/68  Pulse: 67 66  Resp: 19 18  Temp: 98.5 F (36.9 C) 98.1 F (36.7 C)  SpO2: 100% 98%   Vitals:   10/30/16 0300 10/30/16 0608 10/30/16 0911 10/30/16 1334  BP: (!) 162/72 (!) 162/85 (!) 166/68 (!) 152/68  Pulse: (!) 59 63 67 66  Resp: 18 20 19 18   Temp: 98.2 F (36.8 C) 97.9 F (36.6 C) 98.5 F (36.9 C) 98.1 F (36.7 C)  TempSrc: Oral Oral Oral Oral  SpO2: 95% 100% 100% 98%  Weight:      Height:        General: [name] is alert, awake, not in acute distress Cardiovascular: RRR, S1/S2 +, no rubs, no gallops Respiratory: CTA bilaterally, no wheezing, no rhonchi Abdominal: Soft, NT, ND, bowel sounds + Extremities: no edema,  no cyanosis. Left BKA. Right toe amputations Neuro: CN intact. Strength 5/5 in extremities. No dysmetria.    The results of significant diagnostics from this hospitalization (including imaging, microbiology, ancillary and laboratory) are listed below for reference.     Microbiology: No results found for this or any previous visit (from the past 240 hour(s)).   Labs: BNP (last 3 results) No results for input(s): BNP in the last 8760 hours. Basic Metabolic Panel:  Recent Labs Lab 10/29/16 0930 10/30/16 0404  NA 141 144  K 4.4 4.1  CL 107 111  CO2 24 26  GLUCOSE 118* 61*  BUN 75* 60*  CREATININE 3.54* 2.92*  CALCIUM 9.3 9.2   Liver Function  Tests: No results for input(s): AST, ALT, ALKPHOS, BILITOT, PROT, ALBUMIN in the last 168 hours. No results for input(s): LIPASE, AMYLASE in the last 168 hours. No results for input(s): AMMONIA in the last 168 hours. CBC:  Recent Labs Lab 10/29/16 0930  WBC 6.6  HGB 12.2*  HCT 36.8*  MCV 87.6  PLT 150   Cardiac Enzymes: No results for input(s): CKTOTAL, CKMB, CKMBINDEX, TROPONINI in the last 168 hours. BNP: Invalid input(s): POCBNP CBG:  Recent Labs Lab 10/29/16 1712 10/29/16 2131 10/30/16 0632 10/30/16 0700 10/30/16 1058  GLUCAP 134* 167* 50* 99 123*   D-Dimer No results for input(s): DDIMER in the last 72 hours. Hgb A1c No results for input(s): HGBA1C in the last 72 hours. Lipid Profile  Recent Labs  10/29/16 0930  CHOL 181  HDL 58  LDLCALC 94  TRIG 145  CHOLHDL 3.1   Thyroid function studies No results for input(s): TSH, T4TOTAL, T3FREE, THYROIDAB in the last 72 hours.  Invalid input(s): FREET3 Anemia work up No results for input(s): VITAMINB12, FOLATE, FERRITIN, TIBC, IRON, RETICCTPCT in the last 72 hours. Urinalysis    Component Value Date/Time   COLORURINE STRAW (A) 10/29/2016 1133   APPEARANCEUR CLEAR 10/29/2016 1133   LABSPEC 1.011 10/29/2016 1133   PHURINE 7.0 10/29/2016 1133   GLUCOSEU NEGATIVE 10/29/2016 1133   HGBUR NEGATIVE 10/29/2016 1133   BILIRUBINUR NEGATIVE 10/29/2016 1133   KETONESUR NEGATIVE 10/29/2016 1133   PROTEINUR 100 (A) 10/29/2016 1133   UROBILINOGEN 0.2 12/07/2012 1140   NITRITE NEGATIVE 10/29/2016 1133   LEUKOCYTESUR NEGATIVE 10/29/2016 1133    Time coordinating discharge: Over 30 minutes  SIGNED:   Cordelia Poche, MD [facility] 10/30/2016, 1:52 PM Pager [phone]  If 7PM-7AM, please contact night-coverage [email or web address] Password [email or web address]

## 2016-10-30 NOTE — Care Management Obs Status (Signed)
[place] NOTIFICATION   Patient Details  Name: [name] MRN: [id number] Date of Birth: 27-Mar-1945   Medicare Observation Status Notification Given:  Yes    Pollie Friar, RN 10/30/2016, 2:36 PM

## 2016-10-30 NOTE — Progress Notes (Signed)
STROKE TEAM PROGRESS NOTE   SUBJECTIVE (INTERVAL HISTORY) No family is at the bedside.  Overall he feels his condition is completely resolved. MRI negative for stroke. He does have right ICA siphon high grade stenosis. On DAPT   OBJECTIVE Temp:  [97.9 F (36.6 C)-98.5 F (36.9 C)] 98.2 F (36.8 C) (08/13 1730) Pulse Rate:  [59-67] 60 (08/13 1730) Cardiac Rhythm: Normal sinus rhythm (08/13 0801) Resp:  [18-20] 18 (08/13 1730) BP: (152-166)/(68-85) 165/72 (08/13 1730) SpO2:  [95 %-100 %] 100 % (08/13 1730)   Recent Labs Lab 10/29/16 2131 10/30/16 0632 10/30/16 0700 10/30/16 1058 10/30/16 1636  GLUCAP 167* 50* 99 123* 79    Recent Labs Lab 10/29/16 0930 10/30/16 0404  NA 141 144  K 4.4 4.1  CL 107 111  CO2 24 26  GLUCOSE 118* 61*  BUN 75* 60*  CREATININE 3.54* 2.92*  CALCIUM 9.3 9.2   No results for input(s): AST, ALT, ALKPHOS, BILITOT, PROT, ALBUMIN in the last 168 hours.  Recent Labs Lab 10/29/16 0930  WBC 6.6  HGB 12.2*  HCT 36.8*  MCV 87.6  PLT 150   No results for input(s): CKTOTAL, CKMB, CKMBINDEX, TROPONINI in the last 168 hours. No results for input(s): LABPROT, INR in the last 72 hours.  Recent Labs  10/29/16 1133  COLORURINE STRAW*  LABSPEC 1.011  PHURINE 7.0  GLUCOSEU NEGATIVE  HGBUR NEGATIVE  BILIRUBINUR NEGATIVE  KETONESUR NEGATIVE  PROTEINUR 100*  NITRITE NEGATIVE  LEUKOCYTESUR NEGATIVE       Component Value Date/Time   CHOL 181 10/29/2016 0930   TRIG 145 10/29/2016 0930   HDL 58 10/29/2016 0930   CHOLHDL 3.1 10/29/2016 0930   VLDL 29 10/29/2016 0930   LDLCALC 94 10/29/2016 0930   Lab Results  Component Value Date   HGBA1C 6.4 (H) 07/02/2015   No results found for: LABOPIA, COCAINSCRNUR, LABBENZ, AMPHETMU, THCU, LABBARB  No results for input(s): ETH in the last 168 hours.  I have personally reviewed the radiological images below and agree with the radiology interpretations.  Ct Head Wo Contrast 10/29/2016 IMPRESSION:  Atrophy with small vessel chronic ischemic changes of deep cerebral white matter. Old infarcts LEFT occipital and RIGHT anterior limb internal capsule/anterior RIGHT basal ganglia. No acute intracranial abnormalities.   Mri and Mra Head and neck Wo Contrast 10/29/2016 IMPRESSION: No acute infarct. Moderate atrophy and extensive chronic ischemic changes Severe intracranial atherosclerotic disease. Severe stenosis in the posterior cerebral artery. Critical stenosis distal right internal carotid artery. Moderate disease right M2 segments. Mild atherosclerotic disease left internal carotid artery and left middle cerebral artery branches Carotid bifurcation widely patent bilaterally. Moderate stenosis left internal carotid artery at the skullbase. Both vertebral arteries widely patent.    2D Echocardiogram   Left ventricle: The cavity size was normal. Wall thickness was   normal. Systolic function was normal. The estimated ejection   fraction was in the range of 50% to 55%. Wall motion was normal;   there were no regional wall motion abnormalities. Doppler   parameters are consistent with abnormal left ventricular   relaxation (grade 1 diastolic dysfunction). - Left atrium: The atrium was mildly dilated. Impressions: - Normal LV systolic function; mild diastolic dysfunction; mild   LAE.   PHYSICAL EXAM  Temp:  [97.9 F (36.6 C)-98.5 F (36.9 C)] 98.2 F (36.8 C) (08/13 1730) Pulse Rate:  [59-67] 60 (08/13 1730) Resp:  [18-20] 18 (08/13 1730) BP: (152-166)/(68-85) 165/72 (08/13 1730) SpO2:  [95 %-100 %] 100 % (  08/13 1730)  General - Well nourished, well developed, in no apparent distress.  Ophthalmologic - Sharp disc margins OU.  Cardiovascular - Regular rate and rhythm with no murmur.  Mental Status -  Level of arousal and orientation to time, place, and person were intact. Language including expression, naming, repetition, comprehension was assessed and found intact.  Cranial  Nerves II - XII - II - Visual field intact OU. III, IV, VI - Extraocular movements intact. V - Facial sensation intact bilaterally. VII - Facial movement intact bilaterally. VIII - Hearing & vestibular intact bilaterally X - Palate elevates symmetrically. XI - Chin turning & shoulder shrug intact bilaterally. XII - Tongue protrusion intact.  Motor Strength - The patient's strength was normal in all extremities except LLE BKA and RLE toe amputation and pronator drift was absent.  Bulk was normal and fasciculations were absent.   Motor Tone - Muscle tone was assessed at the neck and appendages and was normal.  Reflexes - The patient's reflexes were symmetrical in all extremities and he had no pathological reflexes.  Sensory - Light touch, temperature/pinprick were assessed and were symmetrical.    Coordination - The patient had normal movements in the hands with no ataxia or dysmetria.  Tremor was absent.  Gait and Station - deferred  ASSESSMENT/PLAN Mr. RIAN BUSCHE is a 71 y.o. male with history of CHF on ASA, CKD, DM, HTN, PAD, seizure on lamictal  admitted for leaning towards left. Symptoms now resolved.    TIA - likely due to right ICA siphon high grade stenosis  MRI  No acute stroke  MRA head and neck - b/l PCA stenosis, right ICA siphon high grade stenosis  2D Echo  EF 50-55%  LDL 94  HgbA1c pending  lovenox for VTE prophylaxis  Diet - low sodium heart healthy   aspirin 81 mg daily prior to admission, now on aspirin 325 mg daily and clopidogrel 75 mg daily. Continue DAPT for 3 months and then plavix alone  Patient counseled to be compliant with his antithrombotic medications  Ongoing aggressive stroke risk factor management  Therapy recommendations:  none  Disposition:  home  Diabetes  HgbA1c pending goal < 7.0  controlled  Currently on no meds  CBG monitoring  SSI  DM education  Hypertension  Home meds:   stable  Patient counseled to be  compliant with his blood pressure medications  BP goal 130-150 due to ICA stenosis  Hyperlipidemia  Home meds:  none   Currently on lipitor 40  LDL 94, goal < 70  Add lipitor 40mg    Continue statin at discharge  Other Stroke Risk Factors  Advanced age  Hx stroke/TIA - similar symptoms with dizziness and MRI negative.  Coronary artery disease  Obstructive sleep apnea, on CPAP at home  Other Active Problems  Seizure followed with Dr. Jannifer Franklin.   Neurology will sign off. Please call with questions. Pt will follow up with Dr. Jannifer Franklin, at [facility] in about 4-6 weeks. Thanks for the consult.   Rosalin Hawking, MD PhD Stroke Neurology 10/30/2016 11:25 PM    To contact Stroke Continuity provider, please refer to [email or web address] After hours, contact General Neurology

## 2016-10-30 NOTE — Progress Notes (Signed)
Discharge orders received.  Discharge instructions and follow-up appointments reviewed with the patient and his wife.  VSS upon discharge.  IV removed and education complete.  All belongings sent with the patient.  Transported out via wheelchair.   Cori Razor, RN

## 2016-10-30 NOTE — Care Management Note (Signed)
Case Management Note  Patient Details  Name: [name] MRN: [id number] Date of Birth: 05-06-1945  Subjective/Objective:    Pt in to r/o CVA. He is from home with his spouse.                 Action/Plan: Awaiting MRI and PT/OT recommendations. CM following for d/c needs, physician orders.  Expected Discharge Date:                  Expected Discharge Plan:     In-House Referral:     Discharge planning Services     Post Acute Care Choice:    Choice offered to:     DME Arranged:    DME Agency:     HH Arranged:    HH Agency:     Status of Service:  In process, will continue to follow  If discussed at Long Length of Stay Meetings, dates discussed:    Additional Comments:  Pollie Friar, RN 10/30/2016, 10:01 AM

## 2016-10-30 NOTE — Care Management CC44 (Signed)
Condition Code 44 Documentation Completed  Patient Details  Name: [name] MRN: [id number] Date of Birth: Mar 18, 1946   Condition Code 44 given:  Yes Patient signature on Condition Code 44 notice:  Yes Documentation of 2 MD's agreement:  Yes Code 44 added to claim:  Yes    Pollie Friar, RN 10/30/2016, 2:36 PM
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
## 2016-10-30 NOTE — Progress Notes (Signed)
  Echocardiogram 2D Echocardiogram has been performed.  [name] 10/30/2016, 11:04 AM

## 2016-10-30 NOTE — Care Management Note (Signed)
Case Management Note  Patient Details  Name: [name] MRN: [id number] Date of Birth: 06-23-1945  Subjective/Objective:                    Action/Plan: Pt discharging home with self care. Pt has insurance, PCP and transportation home. No further needs per CM.   Expected Discharge Date:  10/30/16               Expected Discharge Plan:  Home/Self Care  In-House Referral:     Discharge planning Services     Post Acute Care Choice:    Choice offered to:     DME Arranged:    DME Agency:     HH Arranged:    HH Agency:     Status of Service:  Completed, signed off  If discussed at [employer] of Stay Meetings, dates discussed:    Additional Comments:  Pollie Friar, RN 10/30/2016, 4:49 PM

## 2016-10-31 ENCOUNTER — Other Ambulatory Visit (HOSPITAL_COMMUNITY): Payer: Self-pay | Admitting: *Deleted

## 2016-10-31 LAB — HEMOGLOBIN A1C
Hgb A1c MFr Bld: 5.7 % — ABNORMAL HIGH (ref 4.8–5.6)
MEAN PLASMA GLUCOSE: 117 mg/dL

## 2016-10-31 NOTE — Progress Notes (Signed)
   10/30/16 0939  SLP G-Codes **NOT FOR INPATIENT CLASS**  Functional Assessment Tool Used clinical judgement  Functional Limitations Memory  Memory Current Status (C1443) CJ  Memory Goal Status (Q0165) CJ  Memory Discharge Status (E0063) CJ  SLP Evaluations  $ SLP Speech Visit 1 Procedure  SLP Evaluations  $ SLP EVAL LANGUAGE/SOUND PRODUCTION 1 Procedure  Luanna Salk, [facility] SLP [phone]

## 2016-11-01 ENCOUNTER — Encounter (HOSPITAL_COMMUNITY)
Admission: RE | Admit: 2016-11-01 | Discharge: 2016-11-01 | Disposition: A | Discharge status: Home / Self Care | Payer: Medicare Other | Source: Ambulatory Visit | Attending: Nephrology | Admitting: Nephrology

## 2016-11-01 ENCOUNTER — Encounter: Payer: Self-pay | Admitting: Surgery

## 2016-11-01 DIAGNOSIS — D631 Anemia in chronic kidney disease: Secondary | ICD-10-CM | POA: Diagnosis not present

## 2016-11-01 DIAGNOSIS — N183 Chronic kidney disease, stage 3 unspecified: Secondary | ICD-10-CM

## 2016-11-01 LAB — POCT HEMOGLOBIN-HEMACUE: HEMOGLOBIN: 10.7 g/dL — AB (ref 13.0–17.0)

## 2016-11-01 LAB — IRON AND TIBC
IRON: 58 ug/dL (ref 45–182)
SATURATION RATIOS: 26 % (ref 17.9–39.5)
TIBC: 225 ug/dL — AB (ref 250–450)
UIBC: 167 ug/dL

## 2016-11-01 LAB — FERRITIN: Ferritin: 137 ng/mL (ref 24–336)

## 2016-11-01 MED ORDER — DARBEPOETIN ALFA 60 MCG/0.3ML IJ SOSY
60.0000 ug | PREFILLED_SYRINGE | INTRAMUSCULAR | Status: DC
Start: 2016-11-01 — End: 2016-11-02
  Administered 2016-11-01: 60 ug via SUBCUTANEOUS

## 2016-11-01 MED ORDER — DARBEPOETIN ALFA 60 MCG/0.3ML IJ SOSY
PREFILLED_SYRINGE | INTRAMUSCULAR | Status: AC
  Filled 2016-11-01: qty 0.3

## 2016-11-08 ENCOUNTER — Telehealth: Payer: Self-pay | Admitting: *Deleted

## 2016-11-08 NOTE — Telephone Encounter (Signed)
Phone staff called wife back. Offered 8/24 at 12pm. They declined. Requesting week after Dr Jannifer Franklin comes back from vacation. Scheduled appt 11/24/16 at 12pm, check in 11:30am.

## 2016-11-08 NOTE — Telephone Encounter (Signed)
Pt wife called wanting to know about the week after Dr Jannifer Franklin comes off vacation for pt to come in, please call her back

## 2016-11-08 NOTE — Telephone Encounter (Signed)
Called and LVM for pt on home and mobile number. Calling in regards to appt scheduled with CM,NP on 11/13/16. Not appropriate for patient to see NP since carolyn last saw patient 05/24/15. He is coming for hospital f/u for a new sx that Hoyle Sauer cannot see him for. Has to see MD.   If patient calls:  Can we switch his appt to 11/10/16 at 12pm, check in 1130am? Dr Jannifer Franklin on vacation week of 11/13/16. We can fit him in this Friday.

## 2016-11-13 ENCOUNTER — Ambulatory Visit: Payer: Medicare Other | Admitting: Nurse Practitioner

## 2016-11-13 ENCOUNTER — Ambulatory Visit: Payer: Medicare Other | Admitting: Adult Health

## 2016-11-13 ENCOUNTER — Ambulatory Visit (INDEPENDENT_AMBULATORY_CARE_PROVIDER_SITE_OTHER): Payer: Medicare Other | Admitting: Surgery

## 2016-11-13 VITALS — BP 138/67 | HR 66 | Ht 70.0 in | Wt 167.1 lb

## 2016-11-13 DIAGNOSIS — N184 Chronic kidney disease, stage 4 (severe): Secondary | ICD-10-CM | POA: Diagnosis not present
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
## 2016-11-14 DIAGNOSIS — N2581 Secondary hyperparathyroidism of renal origin: Secondary | ICD-10-CM | POA: Diagnosis not present

## 2016-11-14 DIAGNOSIS — E1122 Type 2 diabetes mellitus with diabetic chronic kidney disease: Secondary | ICD-10-CM | POA: Diagnosis not present

## 2016-11-14 DIAGNOSIS — N184 Chronic kidney disease, stage 4 (severe): Secondary | ICD-10-CM | POA: Diagnosis not present

## 2016-11-14 DIAGNOSIS — D649 Anemia, unspecified: Secondary | ICD-10-CM | POA: Diagnosis not present

## 2016-11-14 DIAGNOSIS — Z09 Encounter for follow-up examination after completed treatment for conditions other than malignant neoplasm: Secondary | ICD-10-CM | POA: Diagnosis not present

## 2016-11-14 DIAGNOSIS — I129 Hypertensive chronic kidney disease with stage 1 through stage 4 chronic kidney disease, or unspecified chronic kidney disease: Secondary | ICD-10-CM | POA: Diagnosis not present

## 2016-11-16 DIAGNOSIS — I6529 Occlusion and stenosis of unspecified carotid artery: Secondary | ICD-10-CM | POA: Diagnosis not present

## 2016-11-16 DIAGNOSIS — Z23 Encounter for immunization: Secondary | ICD-10-CM | POA: Diagnosis not present

## 2016-11-16 DIAGNOSIS — E1121 Type 2 diabetes mellitus with diabetic nephropathy: Secondary | ICD-10-CM | POA: Diagnosis not present

## 2016-11-16 DIAGNOSIS — I10 Essential (primary) hypertension: Secondary | ICD-10-CM | POA: Diagnosis not present

## 2016-11-16 DIAGNOSIS — N184 Chronic kidney disease, stage 4 (severe): Secondary | ICD-10-CM | POA: Diagnosis not present

## 2016-11-23 ENCOUNTER — Other Ambulatory Visit: Payer: Self-pay

## 2016-11-23 DIAGNOSIS — I6529 Occlusion and stenosis of unspecified carotid artery: Secondary | ICD-10-CM

## 2016-11-24 ENCOUNTER — Ambulatory Visit (INDEPENDENT_AMBULATORY_CARE_PROVIDER_SITE_OTHER): Payer: Medicare Other | Admitting: Neurology

## 2016-11-24 ENCOUNTER — Encounter: Payer: Self-pay | Admitting: Neurology

## 2016-11-24 VITALS — BP 150/73 | HR 67 | Ht 70.0 in | Wt 161.0 lb

## 2016-11-24 DIAGNOSIS — Z5181 Encounter for therapeutic drug level monitoring: Secondary | ICD-10-CM
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
## 2016-11-27 LAB — LAMOTRIGINE LEVEL: LAMOTRIGINE LVL: 4.8 ug/mL (ref 2.0–20.0)

## 2016-11-29 ENCOUNTER — Encounter (HOSPITAL_COMMUNITY)
Admission: RE | Admit: 2016-11-29 | Discharge: 2016-11-29 | Disposition: A | Discharge status: Home / Self Care | Payer: Medicare Other | Source: Ambulatory Visit | Attending: Nephrology | Admitting: Nephrology

## 2016-11-29 DIAGNOSIS — N183 Chronic kidney disease, stage 3 unspecified: Secondary | ICD-10-CM

## 2016-11-29 DIAGNOSIS — D631 Anemia in chronic kidney disease: Secondary | ICD-10-CM | POA: Insufficient documentation

## 2016-11-29 LAB — IRON AND TIBC
IRON: 71 ug/dL (ref 45–182)
SATURATION RATIOS: 31 % (ref 17.9–39.5)
TIBC: 232 ug/dL — AB (ref 250–450)
UIBC: 161 ug/dL

## 2016-11-29 LAB — POCT HEMOGLOBIN-HEMACUE: HEMOGLOBIN: 11.7 g/dL — AB (ref 13.0–17.0)

## 2016-11-29 LAB — FERRITIN: Ferritin: 152 ng/mL (ref 24–336)

## 2016-11-29 MED ORDER — DARBEPOETIN ALFA 60 MCG/0.3ML IJ SOSY: 60.0000 ug | PREFILLED_SYRINGE | INTRAMUSCULAR | Status: DC

## 2016-11-29 MED ORDER — DARBEPOETIN ALFA 100 MCG/0.5ML IJ SOSY
PREFILLED_SYRINGE | INTRAMUSCULAR | Status: AC
  Administered 2016-12-12: 100 ug via SUBCUTANEOUS
  Filled 2016-11-29: qty 0.5

## 2016-12-05 DIAGNOSIS — E1122 Type 2 diabetes mellitus with diabetic chronic kidney disease: Secondary | ICD-10-CM | POA: Diagnosis not present

## 2016-12-11 ENCOUNTER — Encounter: Payer: Self-pay | Admitting: Surgery

## 2016-12-11 ENCOUNTER — Ambulatory Visit (HOSPITAL_COMMUNITY)
Admission: RE | Admit: 2016-12-11 | Discharge: 2016-12-11 | Disposition: A | Discharge status: Home / Self Care | Payer: Medicare Other | Source: Ambulatory Visit | Attending: Surgery | Admitting: Surgery

## 2016-12-11 ENCOUNTER — Ambulatory Visit (INDEPENDENT_AMBULATORY_CARE_PROVIDER_SITE_OTHER): Payer: Medicare Other | Admitting: Surgery

## 2016-12-11 VITALS — BP 156/76 | HR 66 | Temp 97.3°F | Resp 18 | Ht 70.0 in | Wt 159.0 lb

## 2016-12-11 DIAGNOSIS — I6529 Occlusion and stenosis of unspecified carotid artery: Secondary | ICD-10-CM | POA: Diagnosis not present

## 2016-12-11 DIAGNOSIS — I6523 Occlusion and stenosis of bilateral carotid arteries: Secondary | ICD-10-CM | POA: Insufficient documentation

## 2016-12-11 LAB — VAS US CAROTID
LCCADDIAS: -14 cm/s
LCCAPSYS: 97 cm/s
LEFT ECA DIAS: -6 cm/s
Left CCA dist sys: -65 cm/s
Left CCA prox dias: 13 cm/s
Left ICA dist dias: -25 cm/s
Left ICA dist sys: -75 cm/s
Left ICA prox dias: -21 cm/s
Left ICA prox sys: -62 cm/s
RCCADSYS: -32 cm/s
RCCAPDIAS: 5 cm/s
RIGHT CCA MID DIAS: -8 cm/s
RIGHT ECA DIAS: -4 cm/s
Right CCA prox sys: 42 cm/s
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
## 2016-12-12 DIAGNOSIS — H5340 Unspecified visual field defects: Secondary | ICD-10-CM | POA: Diagnosis not present

## 2016-12-12 DIAGNOSIS — H401133 Primary open-angle glaucoma, bilateral, severe stage: Secondary | ICD-10-CM | POA: Diagnosis not present

## 2016-12-27 ENCOUNTER — Encounter (HOSPITAL_COMMUNITY)
Admission: RE | Admit: 2016-12-27 | Discharge: 2016-12-27 | Disposition: A | Discharge status: Home / Self Care | Payer: Medicare Other | Source: Ambulatory Visit | Attending: Nephrology | Admitting: Nephrology

## 2016-12-27 DIAGNOSIS — N183 Chronic kidney disease, stage 3 unspecified: Secondary | ICD-10-CM

## 2016-12-27 DIAGNOSIS — D631 Anemia in chronic kidney disease: Secondary | ICD-10-CM | POA: Insufficient documentation

## 2016-12-27 LAB — IRON AND TIBC
IRON: 60 ug/dL (ref 45–182)
SATURATION RATIOS: 29 % (ref 17.9–39.5)
TIBC: 210 ug/dL — AB (ref 250–450)
UIBC: 150 ug/dL

## 2016-12-27 LAB — FERRITIN: Ferritin: 135 ng/mL (ref 24–336)

## 2016-12-27 LAB — POCT HEMOGLOBIN-HEMACUE: HEMOGLOBIN: 10.7 g/dL — AB (ref 13.0–17.0)

## 2016-12-27 MED ORDER — DARBEPOETIN ALFA 100 MCG/0.5ML IJ SOSY
100.0000 ug | PREFILLED_SYRINGE | INTRAMUSCULAR | Status: DC
  Administered 2016-12-27: 11:00:00 100 ug via SUBCUTANEOUS

## 2016-12-27 MED ORDER — DARBEPOETIN ALFA 100 MCG/0.5ML IJ SOSY
PREFILLED_SYRINGE | INTRAMUSCULAR | Status: AC
  Filled 2016-12-27: qty 0.5

## 2017-01-24 ENCOUNTER — Encounter (HOSPITAL_COMMUNITY)
Admission: RE | Admit: 2017-01-24 | Discharge: 2017-01-24 | Disposition: A | Discharge status: Home / Self Care | Payer: Medicare Other | Source: Ambulatory Visit | Attending: Nephrology | Admitting: Nephrology

## 2017-01-24 VITALS — BP 126/67 | HR 75 | Temp 98.1°F | Resp 20

## 2017-01-24 DIAGNOSIS — N183 Chronic kidney disease, stage 3 unspecified: Secondary | ICD-10-CM

## 2017-01-24 DIAGNOSIS — D631 Anemia in chronic kidney disease: Secondary | ICD-10-CM | POA: Diagnosis present

## 2017-01-24 LAB — IRON AND TIBC
Iron: 76 ug/dL (ref 45–182)
Saturation Ratios: 35 % (ref 17.9–39.5)
TIBC: 220 ug/dL — AB (ref 250–450)
UIBC: 144 ug/dL

## 2017-01-24 LAB — FERRITIN: Ferritin: 114 ng/mL (ref 24–336)

## 2017-01-24 LAB — POCT HEMOGLOBIN-HEMACUE: HEMOGLOBIN: 10.8 g/dL — AB (ref 13.0–17.0)

## 2017-01-24 MED ORDER — DARBEPOETIN ALFA 100 MCG/0.5ML IJ SOSY
100.0000 ug | PREFILLED_SYRINGE | INTRAMUSCULAR | Status: DC
  Administered 2017-01-24: 11:00:00 100 ug via SUBCUTANEOUS

## 2017-01-24 MED ORDER — DARBEPOETIN ALFA 100 MCG/0.5ML IJ SOSY
PREFILLED_SYRINGE | INTRAMUSCULAR | Status: AC
  Filled 2017-01-24: qty 0.5

## 2017-02-16 ENCOUNTER — Other Ambulatory Visit: Payer: Self-pay | Admitting: Cardiovascular Disease

## 2017-02-16 DIAGNOSIS — I2510 Atherosclerotic heart disease of native coronary artery without angina pectoris: Secondary | ICD-10-CM

## 2017-02-16 DIAGNOSIS — I2583 Coronary atherosclerosis due to lipid rich plaque: Principal | ICD-10-CM

## 2017-02-21 ENCOUNTER — Encounter (HOSPITAL_COMMUNITY)
Admission: RE | Admit: 2017-02-21 | Discharge: 2017-02-21 | Disposition: A | Discharge status: Home / Self Care | Payer: Medicare Other | Source: Ambulatory Visit | Attending: Nephrology | Admitting: Nephrology

## 2017-02-21 VITALS — BP 130/51 | HR 62 | Resp 20

## 2017-02-21 DIAGNOSIS — N183 Chronic kidney disease, stage 3 unspecified: Secondary | ICD-10-CM

## 2017-02-21 DIAGNOSIS — D631 Anemia in chronic kidney disease: Secondary | ICD-10-CM | POA: Insufficient documentation

## 2017-02-21 LAB — IRON AND TIBC
Iron: 74 ug/dL (ref 45–182)
SATURATION RATIOS: 34 % (ref 17.9–39.5)
TIBC: 218 ug/dL — ABNORMAL LOW (ref 250–450)
UIBC: 144 ug/dL

## 2017-02-21 LAB — FERRITIN: Ferritin: 122 ng/mL (ref 24–336)

## 2017-02-21 MED ORDER — DARBEPOETIN ALFA 100 MCG/0.5ML IJ SOSY
PREFILLED_SYRINGE | INTRAMUSCULAR | Status: AC
  Administered 2017-02-21: 12:00:00 100 ug via SUBCUTANEOUS
  Filled 2017-02-21: qty 0.5

## 2017-02-21 MED ORDER — DARBEPOETIN ALFA 100 MCG/0.5ML IJ SOSY
100.0000 ug | PREFILLED_SYRINGE | INTRAMUSCULAR | Status: DC
  Administered 2017-02-21: 100 ug via SUBCUTANEOUS

## 2017-02-22 LAB — POCT HEMOGLOBIN-HEMACUE: Hemoglobin: 11.4 g/dL — ABNORMAL LOW (ref 13.0–17.0)

## 2017-03-05 ENCOUNTER — Encounter (INDEPENDENT_AMBULATORY_CARE_PROVIDER_SITE_OTHER): Payer: Self-pay | Admitting: Family

## 2017-03-05 ENCOUNTER — Ambulatory Visit (INDEPENDENT_AMBULATORY_CARE_PROVIDER_SITE_OTHER): Payer: Medicare Other | Admitting: Family

## 2017-03-05 DIAGNOSIS — L97421 Non-pressure chronic ulcer of left heel and midfoot limited to breakdown of skin: Secondary | ICD-10-CM

## 2017-03-05 DIAGNOSIS — M14671 Charcot's joint, right ankle and foot: Secondary | ICD-10-CM | POA: Diagnosis not present
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
## 2017-03-21 ENCOUNTER — Encounter (HOSPITAL_COMMUNITY)
Admission: RE | Admit: 2017-03-21 | Discharge: 2017-03-21 | Disposition: A | Discharge status: Home / Self Care | Payer: Medicare Other | Source: Ambulatory Visit | Attending: Nephrology | Admitting: Nephrology

## 2017-03-21 VITALS — BP 127/61 | HR 71 | Temp 98.2°F | Resp 18

## 2017-03-21 DIAGNOSIS — N183 Chronic kidney disease, stage 3 unspecified: Secondary | ICD-10-CM

## 2017-03-21 DIAGNOSIS — D631 Anemia in chronic kidney disease: Secondary | ICD-10-CM | POA: Insufficient documentation

## 2017-03-21 LAB — IRON AND TIBC
Iron: 92 ug/dL (ref 45–182)
SATURATION RATIOS: 42 % — AB (ref 17.9–39.5)
TIBC: 217 ug/dL — AB (ref 250–450)
UIBC: 125 ug/dL

## 2017-03-21 LAB — POCT HEMOGLOBIN-HEMACUE: HEMOGLOBIN: 10.9 g/dL — AB (ref 13.0–17.0)

## 2017-03-21 LAB — FERRITIN: Ferritin: 103 ng/mL (ref 24–336)

## 2017-03-21 MED ORDER — DARBEPOETIN ALFA 100 MCG/0.5ML IJ SOSY
PREFILLED_SYRINGE | INTRAMUSCULAR | Status: AC
  Administered 2017-03-21: 100 ug via SUBCUTANEOUS
  Filled 2017-03-21: qty 0.5

## 2017-03-21 MED ORDER — DARBEPOETIN ALFA 100 MCG/0.5ML IJ SOSY
100.0000 ug | PREFILLED_SYRINGE | INTRAMUSCULAR | Status: DC
  Administered 2017-03-21: 100 ug via SUBCUTANEOUS

## 2017-03-22 ENCOUNTER — Ambulatory Visit (INDEPENDENT_AMBULATORY_CARE_PROVIDER_SITE_OTHER): Payer: Medicare Other | Admitting: Family

## 2017-03-23 ENCOUNTER — Encounter (INDEPENDENT_AMBULATORY_CARE_PROVIDER_SITE_OTHER): Payer: Self-pay | Admitting: Family

## 2017-03-23 ENCOUNTER — Ambulatory Visit (INDEPENDENT_AMBULATORY_CARE_PROVIDER_SITE_OTHER): Payer: Medicare Other | Admitting: Family

## 2017-03-23 DIAGNOSIS — L97511 Non-pressure chronic ulcer of other part of right foot limited to breakdown of skin: Secondary | ICD-10-CM
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
## 2017-04-05 ENCOUNTER — Encounter (INDEPENDENT_AMBULATORY_CARE_PROVIDER_SITE_OTHER): Payer: Self-pay | Admitting: Family

## 2017-04-05 ENCOUNTER — Ambulatory Visit (INDEPENDENT_AMBULATORY_CARE_PROVIDER_SITE_OTHER): Payer: Medicare Other | Admitting: Family

## 2017-04-05 DIAGNOSIS — M14671 Charcot's joint, right ankle and foot: Secondary | ICD-10-CM | POA: Diagnosis not present

## 2017-04-05 DIAGNOSIS — L97511 Non-pressure chronic ulcer of other part of right foot limited to breakdown of skin: Secondary | ICD-10-CM | POA: Diagnosis not present
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
## 2017-04-18 ENCOUNTER — Encounter (HOSPITAL_COMMUNITY)
Admission: RE | Admit: 2017-04-18 | Discharge: 2017-04-18 | Disposition: A | Discharge status: Home / Self Care | Payer: Medicare Other | Source: Ambulatory Visit | Attending: Nephrology | Admitting: Nephrology

## 2017-04-18 DIAGNOSIS — N183 Chronic kidney disease, stage 3 unspecified: Secondary | ICD-10-CM

## 2017-04-18 MED ORDER — DARBEPOETIN ALFA 100 MCG/0.5ML IJ SOSY: 100.0000 ug | PREFILLED_SYRINGE | INTRAMUSCULAR | Status: DC

## 2017-04-19 LAB — POCT HEMOGLOBIN-HEMACUE: Hemoglobin: 12.7 g/dL — ABNORMAL LOW (ref 13.0–17.0)

## 2017-04-23 ENCOUNTER — Ambulatory Visit (INDEPENDENT_AMBULATORY_CARE_PROVIDER_SITE_OTHER): Payer: Medicare Other

## 2017-04-23 ENCOUNTER — Encounter (INDEPENDENT_AMBULATORY_CARE_PROVIDER_SITE_OTHER): Payer: Self-pay | Admitting: Family

## 2017-04-23 ENCOUNTER — Ambulatory Visit (INDEPENDENT_AMBULATORY_CARE_PROVIDER_SITE_OTHER): Payer: Medicare Other | Admitting: Family

## 2017-04-23 VITALS — Ht 70.0 in | Wt 159.0 lb

## 2017-04-23 DIAGNOSIS — M14671 Charcot's joint, right ankle and foot: Secondary | ICD-10-CM

## 2017-04-23 DIAGNOSIS — L97913 Non-pressure chronic ulcer of unspecified part of right lower leg with necrosis of muscle: Secondary | ICD-10-CM

## 2017-04-23 MED ORDER — DOXYCYCLINE HYCLATE 100 MG PO TABS: 100.0000 mg | ORAL_TABLET | Freq: Two times a day (BID) | ORAL | 0 refills | Status: DC
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
## 2017-04-24 ENCOUNTER — Telehealth (INDEPENDENT_AMBULATORY_CARE_PROVIDER_SITE_OTHER): Payer: Self-pay | Admitting: Orthopedic Surgery

## 2017-04-24 NOTE — Telephone Encounter (Signed)
Can you please sch for next available appt next week is full.

## 2017-04-24 NOTE — Telephone Encounter (Signed)
Patient's wife ([name]) called advised the MRI will be Saturday February 9th and patient need an appointment as soon as possible for the MRI review. [name] asked if Patient can be worked into Dr Jess Barters schedule. The number to contact [name] is [phone]

## 2017-04-24 NOTE — Telephone Encounter (Signed)
Called patient's wife [name] back scheduled patient 05/07/17 at 8:45am with Dr. [name].

## 2017-04-26 DIAGNOSIS — R972 Elevated prostate specific antigen [PSA]: Secondary | ICD-10-CM | POA: Diagnosis not present

## 2017-04-28 ENCOUNTER — Ambulatory Visit
Admission: RE | Admit: 2017-04-28 | Discharge: 2017-04-28 | Disposition: A | Discharge status: Home / Self Care | Payer: Medicare Other | Source: Ambulatory Visit | Attending: Family | Admitting: Family

## 2017-04-28 DIAGNOSIS — E11621 Type 2 diabetes mellitus with foot ulcer: Secondary | ICD-10-CM | POA: Diagnosis not present

## 2017-04-28 DIAGNOSIS — M14671 Charcot's joint, right ankle and foot: Secondary | ICD-10-CM

## 2017-04-30 ENCOUNTER — Encounter (INDEPENDENT_AMBULATORY_CARE_PROVIDER_SITE_OTHER): Payer: Self-pay | Admitting: Orthopedic Surgery

## 2017-04-30 ENCOUNTER — Ambulatory Visit (INDEPENDENT_AMBULATORY_CARE_PROVIDER_SITE_OTHER): Payer: Medicare Other | Admitting: Orthopedic Surgery

## 2017-04-30 ENCOUNTER — Other Ambulatory Visit (INDEPENDENT_AMBULATORY_CARE_PROVIDER_SITE_OTHER): Payer: Self-pay | Admitting: Family

## 2017-04-30 DIAGNOSIS — M14671 Charcot's joint, right ankle and foot: Secondary | ICD-10-CM

## 2017-04-30 DIAGNOSIS — L97913 Non-pressure chronic ulcer of unspecified part of right lower leg with necrosis of muscle: Secondary | ICD-10-CM | POA: Diagnosis not present
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
## 2017-05-02 ENCOUNTER — Other Ambulatory Visit: Payer: Self-pay

## 2017-05-02 ENCOUNTER — Encounter (HOSPITAL_COMMUNITY): Payer: Self-pay | Admitting: *Deleted

## 2017-05-02 ENCOUNTER — Encounter (HOSPITAL_COMMUNITY)
Admission: RE | Admit: 2017-05-02 | Discharge: 2017-05-02 | Disposition: A | Discharge status: Home / Self Care | Payer: Medicare Other | Source: Ambulatory Visit | Attending: Nephrology | Admitting: Nephrology

## 2017-05-02 VITALS — BP 140/66 | HR 69 | Temp 98.6°F | Resp 16

## 2017-05-02 DIAGNOSIS — N183 Chronic kidney disease, stage 3 unspecified: Secondary | ICD-10-CM

## 2017-05-02 DIAGNOSIS — D631 Anemia in chronic kidney disease: Secondary | ICD-10-CM | POA: Insufficient documentation

## 2017-05-02 LAB — IRON AND TIBC
Iron: 105 ug/dL (ref 45–182)
Saturation Ratios: 60 % — ABNORMAL HIGH (ref 17.9–39.5)
TIBC: 176 ug/dL — ABNORMAL LOW (ref 250–450)
UIBC: 71 ug/dL

## 2017-05-02 LAB — POCT HEMOGLOBIN-HEMACUE: HEMOGLOBIN: 11 g/dL — AB (ref 13.0–17.0)

## 2017-05-02 LAB — FERRITIN: Ferritin: 216 ng/mL (ref 24–336)

## 2017-05-02 MED ORDER — DARBEPOETIN ALFA 100 MCG/0.5ML IJ SOSY
100.0000 ug | PREFILLED_SYRINGE | INTRAMUSCULAR | Status: DC
  Administered 2017-05-02: 100 ug via SUBCUTANEOUS

## 2017-05-02 MED ORDER — DARBEPOETIN ALFA 100 MCG/0.5ML IJ SOSY
PREFILLED_SYRINGE | INTRAMUSCULAR | Status: AC
  Filled 2017-05-02: qty 0.5

## 2017-05-02 NOTE — Progress Notes (Signed)
Spoke with pt's wife, Jamee Pacholski for pre-op call. She states pt does not have a cardiac history. Pt has had a stroke and is on Aspirin and Plavix. She was told he did not need to stop either one by Dr. [name]. She states pt's blood sugar has been running in the 300's for the past several weeks due to the infection, being on steroids, etc. She can't remember his last A1C, but states [facility] should have it. Instructed Mrs. Otero to give pt 1/2 of his regular dose of Levemir Insulin Thursday PM. Instructed her to check pt's blood sugar when he gets up Friday AM and every 2 hours until he leaves for the hospital. If blood sugar is 70 or below, treat with 1/2 cup of clear juice (apple or cranberry) and recheck blood sugar 15 minutes after drinking juice. If blood sugar continues to be 70 or below, call the Short Stay department and ask to speak to a nurse. She voiced understanding. I've called and requested pt's A1C from [facility].

## 2017-05-03 MED ORDER — CLINDAMYCIN PHOSPHATE 900 MG/50ML IV SOLN
900.0000 mg | INTRAVENOUS | Status: AC
  Administered 2017-05-04: 900 mg via INTRAVENOUS
  Filled 2017-05-03: qty 50

## 2017-05-04 ENCOUNTER — Ambulatory Visit (HOSPITAL_COMMUNITY)
Admission: RE | Admit: 2017-05-04 | Discharge: 2017-05-04 | Disposition: A | Discharge status: Home / Self Care | Payer: Medicare Other | Source: Ambulatory Visit | Attending: Orthopedic Surgery | Admitting: Orthopedic Surgery

## 2017-05-04 ENCOUNTER — Other Ambulatory Visit: Payer: Self-pay

## 2017-05-04 ENCOUNTER — Ambulatory Visit (HOSPITAL_COMMUNITY): Payer: Medicare Other | Admitting: Anesthesiology

## 2017-05-04 ENCOUNTER — Encounter (HOSPITAL_COMMUNITY): Payer: Self-pay | Admitting: *Deleted

## 2017-05-04 ENCOUNTER — Encounter (HOSPITAL_COMMUNITY)
Admission: RE | Disposition: A | Discharge status: Home / Self Care | Payer: Self-pay | Source: Ambulatory Visit | Attending: Orthopedic Surgery

## 2017-05-04 DIAGNOSIS — Z885 Allergy status to narcotic agent status: Secondary | ICD-10-CM | POA: Insufficient documentation

## 2017-05-04 DIAGNOSIS — Z7902 Long term (current) use of antithrombotics/antiplatelets: Secondary | ICD-10-CM | POA: Diagnosis not present

## 2017-05-04 DIAGNOSIS — Z8673 Personal history of transient ischemic attack (TIA), and cerebral infarction without residual deficits: Secondary | ICD-10-CM | POA: Diagnosis not present

## 2017-05-04 DIAGNOSIS — L97514 Non-pressure chronic ulcer of other part of right foot with necrosis of bone: Secondary | ICD-10-CM

## 2017-05-04 DIAGNOSIS — I5042 Chronic combined systolic (congestive) and diastolic (congestive) heart failure: Secondary | ICD-10-CM | POA: Diagnosis not present

## 2017-05-04 DIAGNOSIS — I130 Hypertensive heart and chronic kidney disease with heart failure and stage 1 through stage 4 chronic kidney disease, or unspecified chronic kidney disease: Secondary | ICD-10-CM | POA: Diagnosis not present

## 2017-05-04 DIAGNOSIS — N400 Enlarged prostate without lower urinary tract symptoms: Secondary | ICD-10-CM | POA: Diagnosis not present

## 2017-05-04 DIAGNOSIS — Z794 Long term (current) use of insulin: Secondary | ICD-10-CM | POA: Diagnosis not present

## 2017-05-04 DIAGNOSIS — E11621 Type 2 diabetes mellitus with foot ulcer: Secondary | ICD-10-CM | POA: Insufficient documentation

## 2017-05-04 DIAGNOSIS — Z8249 Family history of ischemic heart disease and other diseases of the circulatory system: Secondary | ICD-10-CM | POA: Insufficient documentation

## 2017-05-04 DIAGNOSIS — Z880 Allergy status to penicillin: Secondary | ICD-10-CM | POA: Insufficient documentation

## 2017-05-04 DIAGNOSIS — L97513 Non-pressure chronic ulcer of other part of right foot with necrosis of muscle: Secondary | ICD-10-CM | POA: Diagnosis not present

## 2017-05-04 DIAGNOSIS — E1122 Type 2 diabetes mellitus with diabetic chronic kidney disease: Secondary | ICD-10-CM | POA: Insufficient documentation

## 2017-05-04 DIAGNOSIS — M869 Osteomyelitis, unspecified: Secondary | ICD-10-CM | POA: Diagnosis not present

## 2017-05-04 DIAGNOSIS — N183 Chronic kidney disease, stage 3 (moderate): Secondary | ICD-10-CM | POA: Insufficient documentation

## 2017-05-04 DIAGNOSIS — Z7982 Long term (current) use of aspirin: Secondary | ICD-10-CM | POA: Diagnosis not present

## 2017-05-04 DIAGNOSIS — Z89512 Acquired absence of left leg below knee: Secondary | ICD-10-CM | POA: Insufficient documentation

## 2017-05-04 DIAGNOSIS — E1151 Type 2 diabetes mellitus with diabetic peripheral angiopathy without gangrene: Secondary | ICD-10-CM | POA: Diagnosis not present

## 2017-05-04 DIAGNOSIS — R569 Unspecified convulsions: Secondary | ICD-10-CM | POA: Diagnosis not present

## 2017-05-04 DIAGNOSIS — Z79899 Other long term (current) drug therapy: Secondary | ICD-10-CM | POA: Insufficient documentation

## 2017-05-04 DIAGNOSIS — L97511 Non-pressure chronic ulcer of other part of right foot limited to breakdown of skin: Secondary | ICD-10-CM | POA: Diagnosis not present

## 2017-05-04 DIAGNOSIS — Z89421 Acquired absence of other right toe(s): Secondary | ICD-10-CM | POA: Insufficient documentation

## 2017-05-04 DIAGNOSIS — F329 Major depressive disorder, single episode, unspecified: Secondary | ICD-10-CM | POA: Insufficient documentation

## 2017-05-04 DIAGNOSIS — M1990 Unspecified osteoarthritis, unspecified site: Secondary | ICD-10-CM | POA: Insufficient documentation

## 2017-05-04 DIAGNOSIS — E1169 Type 2 diabetes mellitus with other specified complication: Secondary | ICD-10-CM | POA: Diagnosis not present

## 2017-05-04 DIAGNOSIS — E11610 Type 2 diabetes mellitus with diabetic neuropathic arthropathy: Secondary | ICD-10-CM | POA: Insufficient documentation

## 2017-05-04 HISTORY — PX: BONE EXOSTOSIS EXCISION: SHX1249

## 2017-05-04 LAB — HEMOGLOBIN A1C
Hgb A1c MFr Bld: 8 % — ABNORMAL HIGH (ref 4.8–5.6)
Mean Plasma Glucose: 182.9 mg/dL

## 2017-05-04 LAB — CBC
HEMATOCRIT: 35.4 % — AB (ref 39.0–52.0)
Hemoglobin: 11.8 g/dL — ABNORMAL LOW (ref 13.0–17.0)
MCH: 29.8 pg (ref 26.0–34.0)
MCHC: 33.3 g/dL (ref 30.0–36.0)
MCV: 89.4 fL (ref 78.0–100.0)
Platelets: 234 10*3/uL (ref 150–400)
RBC: 3.96 MIL/uL — AB (ref 4.22–5.81)
RDW: 12.7 % (ref 11.5–15.5)
WBC: 6.7 10*3/uL (ref 4.0–10.5)

## 2017-05-04 LAB — BASIC METABOLIC PANEL
Anion gap: 15 (ref 5–15)
BUN: 80 mg/dL — ABNORMAL HIGH (ref 6–20)
CHLORIDE: 109 mmol/L (ref 101–111)
CO2: 20 mmol/L — ABNORMAL LOW (ref 22–32)
Calcium: 9.9 mg/dL (ref 8.9–10.3)
Creatinine, Ser: 3.14 mg/dL — ABNORMAL HIGH (ref 0.61–1.24)
GFR calc non Af Amer: 19 mL/min — ABNORMAL LOW (ref >60)
GFR, EST AFRICAN AMERICAN: 22 mL/min — AB (ref >60)
Glucose, Bld: 261 mg/dL — ABNORMAL HIGH (ref 65–99)
POTASSIUM: 4.8 mmol/L (ref 3.5–5.1)
Sodium: 144 mmol/L (ref 135–145)

## 2017-05-04 LAB — GLUCOSE, CAPILLARY: Glucose-Capillary: 251 mg/dL — ABNORMAL HIGH (ref 65–99)

## 2017-05-04 SURGERY — EXCISION, EXOSTOSIS
Anesthesia: General | Laterality: Right

## 2017-05-04 MED ORDER — PROPOFOL 10 MG/ML IV BOLUS
INTRAVENOUS | Status: DC | PRN
  Administered 2017-05-04: 130 mg via INTRAVENOUS

## 2017-05-04 MED ORDER — MIDAZOLAM HCL 2 MG/2ML IJ SOLN
INTRAMUSCULAR | Status: AC
  Filled 2017-05-04: qty 2

## 2017-05-04 MED ORDER — PHENYLEPHRINE HCL 10 MG/ML IJ SOLN
INTRAMUSCULAR | Status: DC | PRN
  Administered 2017-05-04 (×2): 120 ug via INTRAVENOUS
  Administered 2017-05-04: 180 ug via INTRAVENOUS

## 2017-05-04 MED ORDER — CHLORHEXIDINE GLUCONATE 4 % EX LIQD: 60.0000 mL | Freq: Once | CUTANEOUS | Status: DC

## 2017-05-04 MED ORDER — FENTANYL CITRATE (PF) 250 MCG/5ML IJ SOLN
INTRAMUSCULAR | Status: AC
  Filled 2017-05-04: qty 5

## 2017-05-04 MED ORDER — EPHEDRINE SULFATE 50 MG/ML IJ SOLN
INTRAMUSCULAR | Status: DC | PRN
  Administered 2017-05-04 (×3): 10 mg via INTRAVENOUS

## 2017-05-04 MED ORDER — LIDOCAINE 2% (20 MG/ML) 5 ML SYRINGE
INTRAMUSCULAR | Status: DC | PRN
  Administered 2017-05-04: 100 mg via INTRAVENOUS

## 2017-05-04 MED ORDER — ONDANSETRON HCL 4 MG/2ML IJ SOLN
INTRAMUSCULAR | Status: AC
  Filled 2017-05-04: qty 2

## 2017-05-04 MED ORDER — ONDANSETRON HCL 4 MG/2ML IJ SOLN
INTRAMUSCULAR | Status: DC | PRN
  Administered 2017-05-04: 4 mg via INTRAVENOUS

## 2017-05-04 MED ORDER — MEPERIDINE HCL 50 MG/ML IJ SOLN: 6.2500 mg | INTRAMUSCULAR | Status: DC | PRN

## 2017-05-04 MED ORDER — FENTANYL CITRATE (PF) 100 MCG/2ML IJ SOLN
INTRAMUSCULAR | Status: DC
Start: 2017-05-04 — End: 2017-05-04
  Filled 2017-05-04: qty 2

## 2017-05-04 MED ORDER — SODIUM CHLORIDE 0.9 % IV SOLN
INTRAVENOUS | Status: DC
  Administered 2017-05-04: 07:00:00 via INTRAVENOUS

## 2017-05-04 MED ORDER — METOCLOPRAMIDE HCL 5 MG/ML IJ SOLN: 10.0000 mg | Freq: Once | INTRAMUSCULAR | Status: DC | PRN

## 2017-05-04 MED ORDER — 0.9 % SODIUM CHLORIDE (POUR BTL) OPTIME
TOPICAL | Status: DC | PRN
  Administered 2017-05-04: 1000 mL

## 2017-05-04 MED ORDER — PHENYLEPHRINE 40 MCG/ML (10ML) SYRINGE FOR IV PUSH (FOR BLOOD PRESSURE SUPPORT)
PREFILLED_SYRINGE | INTRAVENOUS | Status: AC
  Filled 2017-05-04: qty 10

## 2017-05-04 MED ORDER — LIDOCAINE 2% (20 MG/ML) 5 ML SYRINGE
INTRAMUSCULAR | Status: AC
  Filled 2017-05-04: qty 5

## 2017-05-04 MED ORDER — EPHEDRINE 5 MG/ML INJ
INTRAVENOUS | Status: AC
  Filled 2017-05-04: qty 10

## 2017-05-04 MED ORDER — PROPOFOL 10 MG/ML IV BOLUS
INTRAVENOUS | Status: AC
  Filled 2017-05-04: qty 20

## 2017-05-04 MED ORDER — FENTANYL CITRATE (PF) 100 MCG/2ML IJ SOLN: 25.0000 ug | INTRAMUSCULAR | Status: DC | PRN

## 2017-05-04 SURGICAL SUPPLY — 51 items
BLADE AVERAGE 25MMX9MM (BLADE)
BLADE AVERAGE 25X9 (BLADE) IMPLANT
BLADE MINI RND TIP GREEN BEAV (BLADE) IMPLANT
BNDG CMPR 9X4 STRL LF SNTH (GAUZE/BANDAGES/DRESSINGS)
BNDG COHESIVE 1X5 TAN STRL LF (GAUZE/BANDAGES/DRESSINGS) IMPLANT
BNDG COHESIVE 6X5 TAN STRL LF (GAUZE/BANDAGES/DRESSINGS) ×3 IMPLANT
BNDG ESMARK 4X9 LF (GAUZE/BANDAGES/DRESSINGS) IMPLANT
BNDG GAUZE ELAST 4 BULKY (GAUZE/BANDAGES/DRESSINGS) ×6 IMPLANT
BNDG GAUZE STRTCH 6 (GAUZE/BANDAGES/DRESSINGS) IMPLANT
CORDS BIPOLAR (ELECTRODE) ×3 IMPLANT
COTTON STERILE ROLL (GAUZE/BANDAGES/DRESSINGS) IMPLANT
COVER SURGICAL LIGHT HANDLE (MISCELLANEOUS) ×6 IMPLANT
CUFF TOURNIQUET SINGLE 18IN (TOURNIQUET CUFF) IMPLANT
CUFF TOURNIQUET SINGLE 24IN (TOURNIQUET CUFF) IMPLANT
DRAPE OEC MINIVIEW 54X84 (DRAPES) IMPLANT
DRAPE U-SHAPE 47X51 STRL (DRAPES) ×3 IMPLANT
DRSG ADAPTIC 3X8 NADH LF (GAUZE/BANDAGES/DRESSINGS) ×3 IMPLANT
DURAPREP 26ML APPLICATOR (WOUND CARE) ×3 IMPLANT
ELECT REM PT RETURN 9FT ADLT (ELECTROSURGICAL) ×3
ELECTRODE REM PT RTRN 9FT ADLT (ELECTROSURGICAL) ×1 IMPLANT
GAUZE SPONGE 4X4 12PLY STRL (GAUZE/BANDAGES/DRESSINGS) ×3 IMPLANT
GLOVE BIOGEL PI IND STRL 7.0 (GLOVE) ×3 IMPLANT
GLOVE BIOGEL PI IND STRL 9 (GLOVE) ×1 IMPLANT
GLOVE BIOGEL PI INDICATOR 7.0 (GLOVE) ×6
GLOVE BIOGEL PI INDICATOR 9 (GLOVE) ×2
GLOVE SURG ORTHO 9.0 STRL STRW (GLOVE) ×3 IMPLANT
GLOVE SURG SS PI 7.0 STRL IVOR (GLOVE) ×6 IMPLANT
GOWN STRL REUS W/ TWL LRG LVL3 (GOWN DISPOSABLE) ×3 IMPLANT
GOWN STRL REUS W/ TWL XL LVL3 (GOWN DISPOSABLE) ×2 IMPLANT
GOWN STRL REUS W/TWL LRG LVL3 (GOWN DISPOSABLE) ×9
GOWN STRL REUS W/TWL XL LVL3 (GOWN DISPOSABLE) ×6
KIT BASIN OR (CUSTOM PROCEDURE TRAY) ×3 IMPLANT
KIT ROOM TURNOVER OR (KITS) ×3 IMPLANT
MANIFOLD NEPTUNE II (INSTRUMENTS) IMPLANT
NEEDLE HYPO 25GX1X1/2 BEV (NEEDLE) IMPLANT
NS IRRIG 1000ML POUR BTL (IV SOLUTION) ×3 IMPLANT
PACK ORTHO EXTREMITY (CUSTOM PROCEDURE TRAY) ×3 IMPLANT
PAD ARMBOARD 7.5X6 YLW CONV (MISCELLANEOUS) ×6 IMPLANT
PAD CAST 4YDX4 CTTN HI CHSV (CAST SUPPLIES) IMPLANT
PADDING CAST COTTON 4X4 STRL (CAST SUPPLIES)
SPECIMEN JAR SMALL (MISCELLANEOUS) IMPLANT
SUCTION FRAZIER HANDLE 10FR (MISCELLANEOUS) ×2
SUCTION TUBE FRAZIER 10FR DISP (MISCELLANEOUS) ×1 IMPLANT
SUT ETHILON 3 0 FSLX (SUTURE) ×6 IMPLANT
SUT VIC AB 2-0 FS1 27 (SUTURE) ×3 IMPLANT
SYR CONTROL 10ML LL (SYRINGE) IMPLANT
TOWEL OR 17X24 6PK STRL BLUE (TOWEL DISPOSABLE) ×3 IMPLANT
TOWEL OR 17X26 10 PK STRL BLUE (TOWEL DISPOSABLE) ×3 IMPLANT
TUBE CONNECTING 12'X1/4 (SUCTIONS)
TUBE CONNECTING 12X1/4 (SUCTIONS) IMPLANT
WATER STERILE IRR 1000ML POUR (IV SOLUTION) ×3 IMPLANT

## 2017-05-04 NOTE — Anesthesia Preprocedure Evaluation (Signed)
Anesthesia Evaluation  Patient identified by MRN, date of birth, ID band Patient awake    Reviewed: Allergy & Precautions, NPO status , Patient's Chart, lab work & pertinent test results  Airway Mallampati: II  TM Distance: >3 FB Neck ROM: Full    Dental no notable dental hx. (+) Missing   Pulmonary neg pulmonary ROS,    Pulmonary exam normal breath sounds clear to auscultation       Cardiovascular hypertension, Pt. on medications + Peripheral Vascular Disease  Normal cardiovascular exam Rhythm:Regular Rate:Normal     Neuro/Psych Seizures -, Well Controlled,  CVA, No Residual Symptoms negative psych ROS   GI/Hepatic negative GI ROS, Neg liver ROS,   Endo/Other  diabetes, Type 2, Insulin Dependent  Renal/GU negative Renal ROS  negative genitourinary   Musculoskeletal negative musculoskeletal ROS (+)   Abdominal   Peds negative pediatric ROS (+)  Hematology negative hematology ROS (+)   Anesthesia Other Findings   Reproductive/Obstetrics negative OB ROS                             Anesthesia Physical Anesthesia Plan  ASA: III  Anesthesia Plan: General   Post-op Pain Management:    Induction: Intravenous  PONV Risk Score and Plan: 2 and Ondansetron  Airway Management Planned: LMA  Additional Equipment:   Intra-op Plan:   Post-operative Plan: Extubation in OR  Informed Consent: I have reviewed the patients History and Physical, chart, labs and discussed the procedure including the risks, benefits and alternatives for the proposed anesthesia with the patient or authorized representative who has indicated his/her understanding and acceptance.   Dental advisory given  Plan Discussed with: CRNA  Anesthesia Plan Comments:         Anesthesia Quick Evaluation

## 2017-05-04 NOTE — Anesthesia Procedure Notes (Signed)
Procedure Name: LMA Insertion Date/Time: 05/04/2017 8:23 AM Performed by: Babs Bertin, CRNA Pre-anesthesia Checklist: Patient identified, Emergency Drugs available, Suction available and Patient being monitored Patient Re-evaluated:Patient Re-evaluated prior to induction Oxygen Delivery Method: Circle System Utilized Preoxygenation: Pre-oxygenation with 100% oxygen Induction Type: IV induction Ventilation: Mask ventilation without difficulty LMA: LMA inserted LMA Size: 4.0 Number of attempts: 1 Airway Equipment and Method: Bite block Placement Confirmation: positive ETCO2 Tube secured with: Tape Dental Injury: Teeth and Oropharynx as per pre-operative assessment

## 2017-05-04 NOTE — Op Note (Signed)
05/04/2017  8:39 AM  PATIENT:  [name]    PRE-OPERATIVE DIAGNOSIS:  Charcot Ulcer Right Foot  POST-OPERATIVE DIAGNOSIS:  Same  PROCEDURE:  EXCISION ROCKER BOTTOM RIGHT FOOT Local tissue rearrangement for wound closure 3 x 7 cm.  SURGEON:  Newt Minion, MD  PHYSICIAN ASSISTANT:None ANESTHESIA:   General  PREOPERATIVE INDICATIONS:  [name] is a  72 y.o. male with a diagnosis of Charcot Ulcer Right Foot who failed conservative measures and elected for surgical management.    The risks benefits and alternatives were discussed with the patient preoperatively including but not limited to the risks of infection, bleeding, nerve injury, cardiopulmonary complications, the need for revision surgery, among others, and the patient was willing to proceed.  OPERATIVE IMPLANTS: None  OPERATIVE FINDINGS: No evidence of bone infection deep to the surgical resections.  OPERATIVE PROCEDURE: Patient was brought to the operating room and underwent a general anesthetic.  After adequate levels of anesthesia were obtained patient's right lower extremity was prepped using DuraPrep draped into a sterile field a timeout was called.  A football shaped incision was made longitudinally around the ulcerative tissue this left a wound that was 3 x 7 cm.  A osteotome was used to resect the rocker-bottom deformity.  The wound was irrigated with normal saline electrocautery was used for hemostasis.  Local tissue rearrangement was used to close the wound that was 3 x 7 cm.  2-0 nylon was used.  A sterile compressive dressing was applied patient was extubated taken the PACU in stable condition.   DISCHARGE PLANNING:  Antibiotic duration: Perioperative  Weightbearing: Nonweightbearing on the right  Pain medication: Patient requests only Tylenol  Dressing care/ Wound VAC: Follow-up in the office 1 week to change the dressing  Ambulatory devices: Wheelchair  Discharge to: Home  Follow-up: In the office  1 week post operative.

## 2017-05-04 NOTE — Transfer of Care (Signed)
Immediate Anesthesia Transfer of Care Note  Patient: [name]  Procedure(s) Performed: EXCISION ROCKER BOTTOM RIGHT FOOT (Right )  Patient Location: PACU  Anesthesia Type:General  Level of Consciousness: responds to stimulation  Airway & Oxygen Therapy: Patient Spontanous Breathing and Patient connected to face mask oxygen  Post-op Assessment: Report given to RN and Post -op Vital signs reviewed and stable  Post vital signs: Reviewed and stable  Last Vitals:  Vitals:   05/04/17 0843 05/04/17 0900  BP: 120/66 121/62  Pulse: 69 70  Resp: 11 11  Temp: (!) 36.3 C   SpO2: 100% 100%    Last Pain:  Vitals:   05/04/17 0634  TempSrc: Oral      Patients Stated Pain Goal: 5 (87/68/11 5726)  Complications: No apparent anesthesia complications
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
## 2017-05-04 NOTE — Progress Notes (Signed)
Orthopedic Tech Progress Note Patient Details:  [name] 11-28-1945 [id number]  Ortho Devices Type of Ortho Device: Postop shoe/boot Ortho Device/Splint Interventions: Application   Post Interventions Patient Tolerated: Well Instructions Provided: Care of device   [place] Pink 05/04/2017, 9:40 AM

## 2017-05-04 NOTE — Anesthesia Postprocedure Evaluation (Signed)
Anesthesia Post Note  Patient: [name]  Procedure(s) Performed: EXCISION ROCKER BOTTOM RIGHT FOOT (Right )     Patient location during evaluation: PACU Anesthesia Type: General Level of consciousness: awake and alert Pain management: pain level controlled Vital Signs Assessment: post-procedure vital signs reviewed and stable Respiratory status: spontaneous breathing, nonlabored ventilation, respiratory function stable and patient connected to nasal cannula oxygen Cardiovascular status: blood pressure returned to baseline and stable Postop Assessment: no apparent nausea or vomiting Anesthetic complications: no    Last Vitals:  Vitals:   05/04/17 0941 05/04/17 0943  BP:  (!) 160/69  Pulse: 64 64  Resp: 11 11  Temp:    SpO2: 100% 100%    Last Pain:  Vitals:   05/04/17 0634  TempSrc: Oral                 Montez Hageman

## 2017-05-05 ENCOUNTER — Encounter (HOSPITAL_COMMUNITY): Payer: Self-pay | Admitting: Orthopedic Surgery

## 2017-05-06 ENCOUNTER — Observation Stay (HOSPITAL_COMMUNITY)
Admission: EM | Admit: 2017-05-06 | Discharge: 2017-05-08 | Disposition: A | Discharge status: Home / Self Care | Payer: Medicare Other | Attending: Internal Medicine | Admitting: Internal Medicine

## 2017-05-06 ENCOUNTER — Encounter (HOSPITAL_COMMUNITY): Payer: Self-pay

## 2017-05-06 ENCOUNTER — Other Ambulatory Visit: Payer: Self-pay

## 2017-05-06 ENCOUNTER — Emergency Department (HOSPITAL_COMMUNITY): Payer: Medicare Other

## 2017-05-06 DIAGNOSIS — R079 Chest pain, unspecified: Secondary | ICD-10-CM | POA: Diagnosis present

## 2017-05-06 DIAGNOSIS — Z794 Long term (current) use of insulin: Secondary | ICD-10-CM | POA: Diagnosis not present

## 2017-05-06 DIAGNOSIS — R739 Hyperglycemia, unspecified: Secondary | ICD-10-CM | POA: Diagnosis present

## 2017-05-06 DIAGNOSIS — R072 Precordial pain: Secondary | ICD-10-CM | POA: Diagnosis not present

## 2017-05-06 DIAGNOSIS — I160 Hypertensive urgency: Principal | ICD-10-CM | POA: Insufficient documentation

## 2017-05-06 DIAGNOSIS — K567 Ileus, unspecified: Secondary | ICD-10-CM

## 2017-05-06 DIAGNOSIS — N183 Chronic kidney disease, stage 3 (moderate): Secondary | ICD-10-CM | POA: Diagnosis not present

## 2017-05-06 DIAGNOSIS — Z8673 Personal history of transient ischemic attack (TIA), and cerebral infarction without residual deficits: Secondary | ICD-10-CM | POA: Insufficient documentation

## 2017-05-06 DIAGNOSIS — R42 Dizziness and giddiness: Secondary | ICD-10-CM | POA: Diagnosis not present

## 2017-05-06 DIAGNOSIS — R071 Chest pain on breathing: Secondary | ICD-10-CM | POA: Diagnosis not present

## 2017-05-06 DIAGNOSIS — I130 Hypertensive heart and chronic kidney disease with heart failure and stage 1 through stage 4 chronic kidney disease, or unspecified chronic kidney disease: Secondary | ICD-10-CM | POA: Insufficient documentation

## 2017-05-06 DIAGNOSIS — Z7982 Long term (current) use of aspirin: Secondary | ICD-10-CM | POA: Diagnosis not present

## 2017-05-06 DIAGNOSIS — R531 Weakness: Secondary | ICD-10-CM | POA: Diagnosis not present

## 2017-05-06 DIAGNOSIS — N184 Chronic kidney disease, stage 4 (severe): Secondary | ICD-10-CM | POA: Diagnosis not present

## 2017-05-06 DIAGNOSIS — E1122 Type 2 diabetes mellitus with diabetic chronic kidney disease: Secondary | ICD-10-CM | POA: Diagnosis not present

## 2017-05-06 DIAGNOSIS — Z79899 Other long term (current) drug therapy: Secondary | ICD-10-CM | POA: Diagnosis not present

## 2017-05-06 DIAGNOSIS — I129 Hypertensive chronic kidney disease with stage 1 through stage 4 chronic kidney disease, or unspecified chronic kidney disease: Secondary | ICD-10-CM | POA: Diagnosis not present

## 2017-05-06 DIAGNOSIS — Z7902 Long term (current) use of antithrombotics/antiplatelets: Secondary | ICD-10-CM | POA: Diagnosis not present

## 2017-05-06 DIAGNOSIS — I5042 Chronic combined systolic (congestive) and diastolic (congestive) heart failure: Secondary | ICD-10-CM | POA: Diagnosis not present

## 2017-05-06 DIAGNOSIS — R404 Transient alteration of awareness: Secondary | ICD-10-CM | POA: Diagnosis not present

## 2017-05-06 DIAGNOSIS — I10 Essential (primary) hypertension: Secondary | ICD-10-CM | POA: Diagnosis present

## 2017-05-06 LAB — URINALYSIS, ROUTINE W REFLEX MICROSCOPIC
BACTERIA UA: NONE SEEN
Bilirubin Urine: NEGATIVE
KETONES UR: NEGATIVE mg/dL
Leukocytes, UA: NEGATIVE
NITRITE: NEGATIVE
Protein, ur: 100 mg/dL — AB
Specific Gravity, Urine: 1.01 (ref 1.005–1.030)
Squamous Epithelial / LPF: NONE SEEN
pH: 5 (ref 5.0–8.0)

## 2017-05-06 LAB — CBC WITH DIFFERENTIAL/PLATELET
BASOS ABS: 0 10*3/uL (ref 0.0–0.1)
BASOS PCT: 0 %
EOS ABS: 0 10*3/uL (ref 0.0–0.7)
EOS PCT: 0 %
HCT: 35.9 % — ABNORMAL LOW (ref 39.0–52.0)
Hemoglobin: 11.8 g/dL — ABNORMAL LOW (ref 13.0–17.0)
Lymphocytes Relative: 10 %
Lymphs Abs: 0.9 10*3/uL (ref 0.7–4.0)
MCH: 29.4 pg (ref 26.0–34.0)
MCHC: 32.9 g/dL (ref 30.0–36.0)
MCV: 89.5 fL (ref 78.0–100.0)
MONO ABS: 0.5 10*3/uL (ref 0.1–1.0)
Monocytes Relative: 6 %
Neutro Abs: 7.4 10*3/uL (ref 1.7–7.7)
Neutrophils Relative %: 84 %
PLATELETS: 230 10*3/uL (ref 150–400)
RBC: 4.01 MIL/uL — AB (ref 4.22–5.81)
RDW: 12.5 % (ref 11.5–15.5)
WBC: 8.9 10*3/uL (ref 4.0–10.5)

## 2017-05-06 LAB — COMPREHENSIVE METABOLIC PANEL
ALT: 15 U/L — AB (ref 17–63)
AST: 22 U/L (ref 15–41)
Albumin: 3.3 g/dL — ABNORMAL LOW (ref 3.5–5.0)
Alkaline Phosphatase: 96 U/L (ref 38–126)
Anion gap: 12 (ref 5–15)
BUN: 77 mg/dL — AB (ref 6–20)
CHLORIDE: 107 mmol/L (ref 101–111)
CO2: 18 mmol/L — AB (ref 22–32)
CREATININE: 3.04 mg/dL — AB (ref 0.61–1.24)
Calcium: 9.3 mg/dL (ref 8.9–10.3)
GFR calc Af Amer: 22 mL/min — ABNORMAL LOW (ref >60)
GFR, EST NON AFRICAN AMERICAN: 19 mL/min — AB (ref >60)
Glucose, Bld: 438 mg/dL — ABNORMAL HIGH (ref 65–99)
POTASSIUM: 4.8 mmol/L (ref 3.5–5.1)
SODIUM: 137 mmol/L (ref 135–145)
Total Bilirubin: 0.9 mg/dL (ref 0.3–1.2)
Total Protein: 6.7 g/dL (ref 6.5–8.1)

## 2017-05-06 LAB — I-STAT VENOUS BLOOD GAS, ED
ACID-BASE DEFICIT: 1 mmol/L (ref 0.0–2.0)
Bicarbonate: 26.2 mmol/L (ref 20.0–28.0)
O2 SAT: 53 %
PH VEN: 7.291 (ref 7.250–7.430)
PO2 VEN: 32 mmHg (ref 32.0–45.0)
TCO2: 28 mmol/L (ref 22–32)
pCO2, Ven: 54.3 mmHg (ref 44.0–60.0)

## 2017-05-06 LAB — I-STAT TROPONIN, ED: TROPONIN I, POC: 0 ng/mL (ref 0.00–0.08)

## 2017-05-06 LAB — CBG MONITORING, ED: Glucose-Capillary: 439 mg/dL — ABNORMAL HIGH (ref 65–99)

## 2017-05-06 LAB — GLUCOSE, CAPILLARY: Glucose-Capillary: 266 mg/dL — ABNORMAL HIGH (ref 65–99)

## 2017-05-06 LAB — BRAIN NATRIURETIC PEPTIDE: B NATRIURETIC PEPTIDE 5: 12.7 pg/mL (ref 0.0–100.0)

## 2017-05-06 MED ORDER — HEPARIN SODIUM (PORCINE) 5000 UNIT/ML IJ SOLN
5000.0000 [IU] | Freq: Three times a day (TID) | INTRAMUSCULAR | Status: DC
  Administered 2017-05-06 – 2017-05-08 (×5): 5000 [IU] via SUBCUTANEOUS
  Filled 2017-05-06 (×5): qty 1

## 2017-05-06 MED ORDER — INSULIN DETEMIR 100 UNIT/ML ~~LOC~~ SOLN
6.0000 [IU] | Freq: Every day | SUBCUTANEOUS | Status: DC
  Administered 2017-05-07 (×2): 6 [IU] via SUBCUTANEOUS
  Filled 2017-05-06 (×3): qty 0.06

## 2017-05-06 MED ORDER — MEMANTINE HCL 5 MG PO TABS
10.0000 mg | ORAL_TABLET | Freq: Two times a day (BID) | ORAL | Status: DC
  Administered 2017-05-07 – 2017-05-08 (×4): 10 mg via ORAL
  Filled 2017-05-06 (×2): qty 2
  Filled 2017-05-06: qty 1
  Filled 2017-05-06: qty 2
  Filled 2017-05-06: qty 1

## 2017-05-06 MED ORDER — CARVEDILOL 6.25 MG PO TABS
6.2500 mg | ORAL_TABLET | Freq: Two times a day (BID) | ORAL | Status: DC
  Administered 2017-05-07 – 2017-05-08 (×3): 6.25 mg via ORAL
  Filled 2017-05-06 (×3): qty 1

## 2017-05-06 MED ORDER — LAMOTRIGINE 100 MG PO TABS
100.0000 mg | ORAL_TABLET | Freq: Two times a day (BID) | ORAL | Status: DC
  Administered 2017-05-07 – 2017-05-08 (×4): 100 mg via ORAL
  Filled 2017-05-06 (×6): qty 1

## 2017-05-06 MED ORDER — TIMOLOL MALEATE 0.5 % OP SOLN
1.0000 [drp] | Freq: Two times a day (BID) | OPHTHALMIC | Status: DC
  Administered 2017-05-07 – 2017-05-08 (×3): 1 [drp] via OPHTHALMIC
  Filled 2017-05-06 (×3): qty 5

## 2017-05-06 MED ORDER — ADULT MULTIVITAMIN W/MINERALS CH
1.0000 | ORAL_TABLET | Freq: Every day | ORAL | Status: DC
  Administered 2017-05-07 – 2017-05-08 (×2): 1 via ORAL
  Filled 2017-05-06 (×2): qty 1

## 2017-05-06 MED ORDER — CLOPIDOGREL BISULFATE 75 MG PO TABS
75.0000 mg | ORAL_TABLET | Freq: Every day | ORAL | Status: DC
  Administered 2017-05-07 – 2017-05-08 (×2): 75 mg via ORAL
  Filled 2017-05-06 (×2): qty 1

## 2017-05-06 MED ORDER — TAMSULOSIN HCL 0.4 MG PO CAPS
0.4000 mg | ORAL_CAPSULE | Freq: Two times a day (BID) | ORAL | Status: DC
  Administered 2017-05-06 – 2017-05-08 (×4): 0.4 mg via ORAL
  Filled 2017-05-06 (×4): qty 1

## 2017-05-06 MED ORDER — DOXYCYCLINE HYCLATE 100 MG PO TABS
100.0000 mg | ORAL_TABLET | Freq: Two times a day (BID) | ORAL | Status: DC
  Administered 2017-05-06 – 2017-05-08 (×4): 100 mg via ORAL
  Filled 2017-05-06 (×4): qty 1

## 2017-05-06 MED ORDER — ASPIRIN 325 MG PO TABS
325.0000 mg | ORAL_TABLET | Freq: Every day | ORAL | Status: DC
  Administered 2017-05-07 – 2017-05-08 (×2): 325 mg via ORAL
  Filled 2017-05-06 (×2): qty 1

## 2017-05-06 MED ORDER — POLYETHYLENE GLYCOL 3350 17 G PO PACK: 17.0000 g | PACK | Freq: Every day | ORAL | Status: DC | PRN

## 2017-05-06 MED ORDER — LIDOCAINE 5 % EX PTCH: 1.0000 | MEDICATED_PATCH | Freq: Every day | CUTANEOUS | Status: DC | PRN

## 2017-05-06 MED ORDER — ACETAMINOPHEN 500 MG PO TABS: 500.0000 mg | ORAL_TABLET | Freq: Two times a day (BID) | ORAL | Status: DC | PRN

## 2017-05-06 MED ORDER — SODIUM CHLORIDE 0.9 % IV BOLUS (SEPSIS)
500.0000 mL | Freq: Once | INTRAVENOUS | Status: AC
  Administered 2017-05-06: 500 mL via INTRAVENOUS

## 2017-05-06 MED ORDER — FUROSEMIDE 40 MG PO TABS
40.0000 mg | ORAL_TABLET | Freq: Every day | ORAL | Status: DC
  Administered 2017-05-07 – 2017-05-08 (×2): 40 mg via ORAL
  Filled 2017-05-06 (×2): qty 1

## 2017-05-06 MED ORDER — LATANOPROST 0.005 % OP SOLN
1.0000 [drp] | Freq: Every day | OPHTHALMIC | Status: DC
  Filled 2017-05-06: qty 2.5

## 2017-05-06 MED ORDER — ISOSORB DINITRATE-HYDRALAZINE 20-37.5 MG PO TABS
1.0000 | ORAL_TABLET | Freq: Two times a day (BID) | ORAL | Status: DC
  Administered 2017-05-07 – 2017-05-08 (×4): 1 via ORAL
  Filled 2017-05-06 (×5): qty 1

## 2017-05-06 MED ORDER — SILVER SULFADIAZINE 1 % EX CREA: 1.0000 "application " | TOPICAL_CREAM | Freq: Every day | CUTANEOUS | Status: DC | PRN

## 2017-05-06 MED ORDER — ATORVASTATIN CALCIUM 20 MG PO TABS
20.0000 mg | ORAL_TABLET | Freq: Every evening | ORAL | Status: DC
  Administered 2017-05-07 (×2): 20 mg via ORAL
  Filled 2017-05-06 (×2): qty 1

## 2017-05-06 MED ORDER — VITAMIN C 500 MG PO TABS
500.0000 mg | ORAL_TABLET | Freq: Every day | ORAL | Status: DC
  Administered 2017-05-07 – 2017-05-08 (×2): 500 mg via ORAL
  Filled 2017-05-06 (×2): qty 1

## 2017-05-06 MED ORDER — LIDOCAINE 4 % EX PTCH: MEDICATED_PATCH | Freq: Every day | CUTANEOUS | Status: DC | PRN

## 2017-05-06 MED ORDER — VENLAFAXINE HCL ER 75 MG PO CP24
75.0000 mg | ORAL_CAPSULE | Freq: Every day | ORAL | Status: DC
  Administered 2017-05-07 – 2017-05-08 (×2): 75 mg via ORAL
  Filled 2017-05-06 (×3): qty 1

## 2017-05-06 MED ORDER — ONDANSETRON HCL 4 MG/2ML IJ SOLN
4.0000 mg | Freq: Four times a day (QID) | INTRAMUSCULAR | Status: DC | PRN
Start: 2017-05-06 — End: 2017-05-08

## 2017-05-06 MED ORDER — ACETAMINOPHEN 325 MG PO TABS: 650.0000 mg | ORAL_TABLET | ORAL | Status: DC | PRN

## 2017-05-06 MED ORDER — GABAPENTIN 400 MG PO CAPS
400.0000 mg | ORAL_CAPSULE | Freq: Every day | ORAL | Status: DC
  Administered 2017-05-06 – 2017-05-07 (×2): 400 mg via ORAL
  Filled 2017-05-06 (×2): qty 1

## 2017-05-06 MED ORDER — DOCUSATE SODIUM 100 MG PO CAPS
100.0000 mg | ORAL_CAPSULE | Freq: Two times a day (BID) | ORAL | Status: DC
  Administered 2017-05-06 – 2017-05-08 (×4): 100 mg via ORAL
  Filled 2017-05-06 (×4): qty 1

## 2017-05-06 NOTE — ED Notes (Signed)
Nurse will draw labs. 

## 2017-05-06 NOTE — ED Notes (Signed)
Patient transported to X-ray 

## 2017-05-06 NOTE — ED Triage Notes (Signed)
Pt brought in by [facility] from home for new onset of weakness since earlier today. EMS states there are no neuro deficits. CBG 568. Pt had sx on Friday to remove an ulcer. Denies CP, SOB, NVD. Pt restricted on the right arm, states they attempted to place fistula in the instance that they would need to put pt on dialysis.
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
## 2017-05-07 ENCOUNTER — Observation Stay (HOSPITAL_COMMUNITY): Payer: Medicare Other

## 2017-05-07 ENCOUNTER — Encounter (HOSPITAL_COMMUNITY): Payer: Self-pay | Admitting: *Deleted

## 2017-05-07 ENCOUNTER — Ambulatory Visit (INDEPENDENT_AMBULATORY_CARE_PROVIDER_SITE_OTHER): Payer: Medicare Other | Admitting: Orthopedic Surgery

## 2017-05-07 DIAGNOSIS — R1032 Left lower quadrant pain: Secondary | ICD-10-CM

## 2017-05-07 DIAGNOSIS — I10 Essential (primary) hypertension: Secondary | ICD-10-CM | POA: Diagnosis not present

## 2017-05-07 DIAGNOSIS — R079 Chest pain, unspecified: Secondary | ICD-10-CM | POA: Diagnosis not present

## 2017-05-07 DIAGNOSIS — I5042 Chronic combined systolic (congestive) and diastolic (congestive) heart failure: Secondary | ICD-10-CM

## 2017-05-07 DIAGNOSIS — Z794 Long term (current) use of insulin: Secondary | ICD-10-CM | POA: Diagnosis not present

## 2017-05-07 DIAGNOSIS — N184 Chronic kidney disease, stage 4 (severe): Secondary | ICD-10-CM

## 2017-05-07 DIAGNOSIS — E118 Type 2 diabetes mellitus with unspecified complications: Secondary | ICD-10-CM | POA: Diagnosis not present

## 2017-05-07 DIAGNOSIS — E1143 Type 2 diabetes mellitus with diabetic autonomic (poly)neuropathy: Secondary | ICD-10-CM | POA: Diagnosis not present

## 2017-05-07 DIAGNOSIS — K567 Ileus, unspecified: Secondary | ICD-10-CM | POA: Diagnosis not present

## 2017-05-07 DIAGNOSIS — E1122 Type 2 diabetes mellitus with diabetic chronic kidney disease: Secondary | ICD-10-CM | POA: Diagnosis not present

## 2017-05-07 DIAGNOSIS — E1165 Type 2 diabetes mellitus with hyperglycemia: Secondary | ICD-10-CM | POA: Diagnosis not present

## 2017-05-07 DIAGNOSIS — R739 Hyperglycemia, unspecified: Secondary | ICD-10-CM | POA: Diagnosis not present

## 2017-05-07 DIAGNOSIS — K3184 Gastroparesis: Secondary | ICD-10-CM | POA: Diagnosis not present

## 2017-05-07 LAB — CBC
HEMATOCRIT: 34.9 % — AB (ref 39.0–52.0)
Hemoglobin: 11.2 g/dL — ABNORMAL LOW (ref 13.0–17.0)
MCH: 28.7 pg (ref 26.0–34.0)
MCHC: 32.1 g/dL (ref 30.0–36.0)
MCV: 89.5 fL (ref 78.0–100.0)
Platelets: 203 10*3/uL (ref 150–400)
RBC: 3.9 MIL/uL — ABNORMAL LOW (ref 4.22–5.81)
RDW: 12.6 % (ref 11.5–15.5)
WBC: 8.5 10*3/uL (ref 4.0–10.5)

## 2017-05-07 LAB — INFLUENZA PANEL BY PCR (TYPE A & B)
Influenza A By PCR: NEGATIVE
Influenza B By PCR: NEGATIVE

## 2017-05-07 LAB — GLUCOSE, CAPILLARY
GLUCOSE-CAPILLARY: 225 mg/dL — AB (ref 65–99)
GLUCOSE-CAPILLARY: 69 mg/dL (ref 65–99)
Glucose-Capillary: 115 mg/dL — ABNORMAL HIGH (ref 65–99)
Glucose-Capillary: 187 mg/dL — ABNORMAL HIGH (ref 65–99)

## 2017-05-07 LAB — TROPONIN I
Troponin I: <0.03 ng/mL (ref <0.03)
Troponin I: <0.03 ng/mL (ref <0.03)

## 2017-05-07 LAB — CREATININE, SERUM
Creatinine, Ser: 2.9 mg/dL — ABNORMAL HIGH (ref 0.61–1.24)
GFR calc Af Amer: 24 mL/min — ABNORMAL LOW (ref >60)
GFR calc non Af Amer: 20 mL/min — ABNORMAL LOW (ref >60)

## 2017-05-07 LAB — CBG MONITORING, ED: Glucose-Capillary: 199 mg/dL — ABNORMAL HIGH (ref 65–99)

## 2017-05-07 MED ORDER — INSULIN ASPART 100 UNIT/ML ~~LOC~~ SOLN
0.0000 [IU] | SUBCUTANEOUS | Status: DC
  Administered 2017-05-07 (×2): 4 [IU] via SUBCUTANEOUS
  Administered 2017-05-07: 7 [IU] via SUBCUTANEOUS
  Administered 2017-05-08: 3 [IU] via SUBCUTANEOUS
  Filled 2017-05-07: qty 1

## 2017-05-07 MED ORDER — ACETAMINOPHEN 500 MG PO TABS: 500.0000 mg | ORAL_TABLET | Freq: Two times a day (BID) | ORAL | Status: DC | PRN

## 2017-05-07 NOTE — ED Notes (Signed)
Patient placed in hospital bed

## 2017-05-07 NOTE — ED Notes (Signed)
Pt provided with turkey sandwich and water
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
## 2017-05-07 NOTE — Progress Notes (Signed)
PROGRESS NOTE  [name] ESP:[id number] DOB: 11/23/1945 DOA: 05/06/2017 PCP: Deland Pretty, MD  HPI/Recap of past 47 hours: 72 year old male with past medical history of stage IV chronic kidney disease, chronic systolic/diastolic heart failure whose ejection fraction has ranged from 25% most recently 50%, seizure disorder and status post left foot amputation secondary to poorly controlled diabetes mellitus admitted on the evening of 2/17 with complaints of weakness, chest pain and CBG of 568. Patient had a biopsy of a chronic right foot ulceration 1 week prior. In the emergency room, enzymes 3 negative. Patient's chest pain resolved. He is now complaining of some abdominal pain and feeling weak. Following insulin administration, CBGs have become much more stable.     Assessment/Plan: Principal Problem:   Chest pain: Chest pain resolved. Enzymes 3 negative.  Seen by cardiology and felt acute MI ruled out. Given his advanced renal disease, further invasive nuclear testing not recommended and if symptoms persist, recommendation is to do this as an outpatient. Active Problems:   Hypertension   CKD (chronic kidney disease) stage 4: Looks to be at baseline. Still making urine. Eventually will need dialysis in the future     poorly controlled diabetes mellitus with secondary renal issues and acute Hyperglycemia: Unclear etiology why. Family reports the patient sometimes is not the best with taking his in. We'll continue to monitor. Abdominal pain: Check portable abdominal x-ray, suspect gastroparesis.    Code Status: Full code    Family Communication: Wife and daughter at the bedside    Disposition Plan: If CBGs remained stable and no findings on abdominal x-ray, anticipate discharge home tomorrow   Consultants:  Cardiology   Procedures:  None   Antimicrobials:  None   DVT prophylaxis:  Lovenox   Objective: Vitals:   05/07/17 1045 05/07/17 1112 05/07/17 1141 05/07/17 1652   BP: (!) 184/78  (!) 149/89 130/80  Pulse: 65 73 79 71  Resp:  12 18 17   Temp:   98.7 F (37.1 C) 98.9 F (37.2 C)  TempSrc:   Oral Oral  SpO2: 100% 100% 99%   Weight:      Height:   5\' 10"  (1.778 m)     Intake/Output Summary (Last 24 hours) at 05/07/2017 1821 Last data filed at 05/07/2017 1817 Gross per 24 hour  Intake 620 ml  Output 550 ml  Net 70 ml   Filed Weights   05/06/17 1857  Weight: 65.8 kg (145 lb)   Body mass index is 20.81 kg/m.  Exam:   General:  Alert and oriented 3, no acute distress, fatigued    Cardiovascular: Regular rate and rhythm, S1-S2    Respiratory: Clear to auscultation bilaterally   Abdomen: soft, minimal tenderness in the mid to left lower quadrant, hypoactive bowel sounds, nondistended   Musculoskeletal: Status post left foot amputation, right foot wrapped, chronic ulceration underneath   Psychiatry: Appropriate no evidence of psychoses    Data Reviewed: CBC: Recent Labs  Lab 05/02/17 1116 05/04/17 0713 05/06/17 2040 05/06/17 2345  WBC  --  6.7 8.9 8.5  NEUTROABS  --   --  7.4  --   HGB 11.0* 11.8* 11.8* 11.2*  HCT  --  35.4* 35.9* 34.9*  MCV  --  89.4 89.5 89.5  PLT  --  234 230 633   Basic Metabolic Panel: Recent Labs  Lab 05/04/17 0713 05/06/17 1931 05/06/17 2345  NA 144 137  --   K 4.8 4.8  --   CL 109  107  --   CO2 20* 18*  --   GLUCOSE 261* 438*  --   BUN 80* 77*  --   CREATININE 3.14* 3.04* 2.90*  CALCIUM 9.9 9.3  --    GFR: Estimated Creatinine Clearance: 22.1 mL/min (A) (by C-G formula based on SCr of 2.9 mg/dL (H)). Liver Function Tests: Recent Labs  Lab 05/06/17 1931  AST 22  ALT 15*  ALKPHOS 96  BILITOT 0.9  PROT 6.7  ALBUMIN 3.3*   No results for input(s): LIPASE, AMYLASE in the last 168 hours. No results for input(s): AMMONIA in the last 168 hours. Coagulation Profile: No results for input(s): INR, PROTIME in the last 168 hours. Cardiac Enzymes: Recent Labs  Lab 05/06/17 2345  05/07/17 0246 05/07/17 0514  TROPONINI <0.03 <0.03 <0.03   BNP (last 3 results) Recent Labs    05/09/16 0000  PROBNP 123   HbA1C: No results for input(s): HGBA1C in the last 72 hours. CBG: Recent Labs  Lab 05/04/17 0850 05/06/17 1816 05/07/17 0751 05/07/17 1220 05/07/17 1717  GLUCAP 266* 439* 199* 225* 69   Lipid Profile: No results for input(s): CHOL, HDL, LDLCALC, TRIG, CHOLHDL, LDLDIRECT in the last 72 hours. Thyroid Function Tests: No results for input(s): TSH, T4TOTAL, FREET4, T3FREE, THYROIDAB in the last 72 hours. Anemia Panel: No results for input(s): VITAMINB12, FOLATE, FERRITIN, TIBC, IRON, RETICCTPCT in the last 72 hours. Urine analysis:    Component Value Date/Time   COLORURINE YELLOW 05/06/2017 1931   APPEARANCEUR CLEAR 05/06/2017 1931   LABSPEC 1.010 05/06/2017 1931   PHURINE 5.0 05/06/2017 1931   GLUCOSEU >=500 (A) 05/06/2017 1931   HGBUR SMALL (A) 05/06/2017 1931   BILIRUBINUR NEGATIVE 05/06/2017 1931   KETONESUR NEGATIVE 05/06/2017 1931   PROTEINUR 100 (A) 05/06/2017 1931   UROBILINOGEN 0.2 12/07/2012 1140   NITRITE NEGATIVE 05/06/2017 1931   LEUKOCYTESUR NEGATIVE 05/06/2017 1931   Sepsis Labs: @LABRCNTIP (procalcitonin:4,lacticidven:4)  )No results found for this or any previous visit (from the past 240 hour(s)).    Studies: Dg Chest 2 View  Result Date: 05/06/2017 CLINICAL DATA:  New onset weakness. EXAM: CHEST  2 VIEW COMPARISON:  07/02/2015 FINDINGS: The cardiomediastinal silhouette is within normal limits. The lungs are well inflated and clear. No pleural effusion or pneumothorax is identified. Old right rib fractures are noted. IMPRESSION: No active cardiopulmonary disease. Electronically Signed   By: Logan Bores M.D.   On: 05/06/2017 20:14    Scheduled Meds: . aspirin  325 mg Oral Daily  . atorvastatin  20 mg Oral QPM  . carvedilol  6.25 mg Oral BID WC  . clopidogrel  75 mg Oral Daily  . docusate sodium  100 mg Oral BID  .  doxycycline  100 mg Oral BID  . furosemide  40 mg Oral Daily  . gabapentin  400 mg Oral QHS  . heparin  5,000 Units Subcutaneous Q8H  . insulin aspart  0-20 Units Subcutaneous Q4H  . insulin detemir  6 Units Subcutaneous QHS  . isosorbide-hydrALAZINE  1 tablet Oral BID  . lamoTRIgine  100 mg Oral BID  . latanoprost  1 drop Both Eyes QHS  . memantine  10 mg Oral BID  . multivitamin with minerals  1 tablet Oral Daily  . tamsulosin  0.4 mg Oral BID  . timolol  1 drop Both Eyes BID  . venlafaxine XR  75 mg Oral Daily  . vitamin C  500 mg Oral Daily    Continuous Infusions:   LOS:  0 days     Annita Brod, MD [facility]  To reach me or the doctor on call, go to: [email or web address] Password [facility]  05/07/2017, 6:21 PM

## 2017-05-08 DIAGNOSIS — Z794 Long term (current) use of insulin: Secondary | ICD-10-CM

## 2017-05-08 DIAGNOSIS — K3184 Gastroparesis: Secondary | ICD-10-CM

## 2017-05-08 DIAGNOSIS — R739 Hyperglycemia, unspecified: Secondary | ICD-10-CM | POA: Diagnosis not present

## 2017-05-08 DIAGNOSIS — R079 Chest pain, unspecified: Secondary | ICD-10-CM

## 2017-05-08 DIAGNOSIS — E1122 Type 2 diabetes mellitus with diabetic chronic kidney disease: Secondary | ICD-10-CM | POA: Diagnosis not present

## 2017-05-08 DIAGNOSIS — E1143 Type 2 diabetes mellitus with diabetic autonomic (poly)neuropathy: Secondary | ICD-10-CM | POA: Diagnosis not present

## 2017-05-08 DIAGNOSIS — I10 Essential (primary) hypertension: Secondary | ICD-10-CM | POA: Diagnosis not present

## 2017-05-08 DIAGNOSIS — E1165 Type 2 diabetes mellitus with hyperglycemia: Secondary | ICD-10-CM

## 2017-05-08 LAB — GLUCOSE, CAPILLARY
GLUCOSE-CAPILLARY: 98 mg/dL (ref 65–99)
Glucose-Capillary: 92 mg/dL (ref 65–99)
Glucose-Capillary: 140 mg/dL — ABNORMAL HIGH (ref 65–99)

## 2017-05-08 MED ORDER — LACTULOSE 10 GM/15ML PO SOLN
10.0000 g | ORAL | Status: AC
  Administered 2017-05-08: 10 g via ORAL
  Filled 2017-05-08: qty 15

## 2017-05-08 MED ORDER — METOCLOPRAMIDE HCL 5 MG/ML IJ SOLN
5.0000 mg | Freq: Four times a day (QID) | INTRAMUSCULAR | Status: DC
  Administered 2017-05-08: 5 mg via INTRAVENOUS
  Filled 2017-05-08: qty 2

## 2017-05-08 MED ORDER — METOCLOPRAMIDE HCL 5 MG/5ML PO SOLN: 5.0000 mg | Freq: Three times a day (TID) | ORAL | 0 refills | Status: DC

## 2017-05-08 NOTE — Discharge Summary (Signed)
Discharge Summary  [name] EUM:[id number] DOB: 1946-01-13  PCP: Deland Pretty, MD  Admit date: 05/06/2017 Discharge date: 05/08/2017  Time spent: 25 minutes   Recommendations for Outpatient Follow-up:  1. Patient will follow-up with cardiology next one month 2. Medication: Reglan solution 5 mg 3 times a day with meals  3. patient will follow-up with his PCP in the next one month   Discharge Diagnoses:  Active Hospital Problems   Diagnosis Date Noted  . Chest pain 05/06/2017  . Hyperglycemia 03/14/2013  . CKD (chronic kidney disease) stage 3, GFR 30-59 ml/min (HCC) 12/02/2012  . Hypertension 11/17/2012    Resolved Hospital Problems  No resolved problems to display.    Discharge Condition:   Improved, being discharged home  Diet recommendation: heart healthy, carb modified   Vitals:   05/08/17 0930 05/08/17 1313  BP:  (!) 99/58  Pulse: 69 72  Resp: 16 12  Temp:  98.1 F (36.7 C)  SpO2: 100% 100%    History of present illness:  72 year old male with past medical history of stage IV chronic kidney disease, chronic systolic/diastolic heart failure whose ejection fraction has ranged from 25% most recently 50%, seizure disorder and status post left foot amputation secondary to poorly controlled diabetes mellitus admitted on the evening of 2/17 with complaints of weakness, chest pain and CBG of 568. Patient had a biopsy of a chronic right foot ulceration 1 week prior. In the emergency room, enzymes 3 negative. Patient's chest pain resolved. He is now complaining of some abdominal pain and feeling weak. Following insulin administration, CBGs have become much more stable.   Hospital Course:  Principal Problem:   Chest pain: Chest pain resolved. Enzymes 3 negative.  Seen by cardiology and felt acute MI ruled out. Given his advanced renal disease, further invasive nuclear testing not recommended and if symptoms persist, recommendation is to do this as an outpatient.   Follow-up appointment has been made  Active Problems:   Hypertension: Stable continue home medications   diabetes mellitus uncontrolled with long-term use of insulin with neuro complications: Suspect constipation and abdominal pain secondary to gastroparesis And stage IV chronic kidney disease. Abdominal x-ray done noting a significant amount of constipation. Patient given 1 dose watching low's with good results. Should her on Reglan which she has tolerated well. Plan to discharge on by mouth Reglan.  Stable, at baseline. Eventually will need dialysis in the future    Procedures None   Consultations:  Cardiology   Discharge Exam: BP (!) 99/58 (BP Location: Left Arm)   Pulse 72   Temp 98.1 F (36.7 C) (Oral)   Resp 12   Ht 5\' 10"  (1.778 m)   Wt 58.3 kg (128 lb 8.5 oz)   SpO2 100%   BMI 18.44 kg/m   GeneralAlert and oriented 3, no acute distress Cardiovascular: Regular rate and rhythm, S1-S2   Respiratory: clear to auscultation bilaterally    Discharge Instructions You were cared for by a hospitalist during your hospital stay. If you have any questions about your discharge medications or the care you received while you were in the hospital after you are discharged, you can call the unit and asked to speak with the hospitalist on call if the hospitalist that took care of you is not available. Once you are discharged, your primary care physician will handle any further medical issues. Please note that NO REFILLS for any discharge medications will be authorized once you are discharged, as it is imperative that  you return to your primary care physician (or establish a relationship with a primary care physician if you do not have one) for your aftercare needs so that they can reassess your need for medications and monitor your lab values.  Discharge Instructions    Diet - low sodium heart healthy   Complete by:  As directed    Increase activity slowly   Complete by:  As directed        Allergies as of 05/08/2017      Reactions   Codeine Anaphylaxis   Penicillins Anaphylaxis, Other (See Comments)   PATIENT HAD A PCN REACTION WITH IMMEDIATE RASH, FACIAL/TONGUE/THROAT SWELLING, SOB, OR LIGHTHEADEDNESS WITH HYPOTENSION:  #  #  #  YES  #  #  #  Has patient had a PCN reaction causing severe rash involving mucus membranes or skin necrosis: No Has patient had a PCN reaction that required hospitalization: Unknown Has patient had a PCN reaction occurring within the last 10 years: No If all of the above answers are "NO", then may proceed with Cephalosporin use.      Medication List    TAKE these medications   acetaminophen 500 MG tablet Commonly known as:  TYLENOL Take 500 mg by mouth 2 (two) times daily as needed for moderate pain or headache.   ASPERCREME LIDOCAINE EX Apply 1 application topically daily as needed (for pain).   aspirin 325 MG tablet Take 325 mg by mouth daily.   atorvastatin 20 MG tablet Commonly known as:  LIPITOR Take 20 mg by mouth every evening.   carvedilol 6.25 MG tablet Commonly known as:  COREG Take 1 tablet (6.25 mg total) by mouth 2 (two) times daily with a meal.   CENTRUM SILVER ADULT 50+ PO Take 1 tablet by mouth daily.   clopidogrel 75 MG tablet Commonly known as:  PLAVIX Take 1 tablet (75 mg total) by mouth daily.   docusate sodium 100 MG capsule Commonly known as:  COLACE Take 1 capsule (100 mg total) by mouth 2 (two) times daily.   doxycycline 100 MG tablet Commonly known as:  VIBRA-TABS Take 1 tablet (100 mg total) by mouth 2 (two) times daily.   furosemide 40 MG tablet Commonly known as:  LASIX Take 1 tablet (40 mg total) by mouth daily.   gabapentin 100 MG capsule Commonly known as:  NEURONTIN Take 400 mg by mouth at bedtime.   insulin detemir 100 UNIT/ML injection Commonly known as:  LEVEMIR Inject 0.05 mLs (5 Units total) into the skin at bedtime. What changed:  how much to take   isosorbide-hydrALAZINE  20-37.5 MG tablet Commonly known as:  BIDIL Take 1 tablet by mouth 2 (two) times daily.   lamoTRIgine 100 MG tablet Commonly known as:  LAMICTAL Take 1 tablet (100 mg total) by mouth 2 (two) times daily.   memantine 10 MG tablet Commonly known as:  NAMENDA Take 1 tablet (10 mg total) by mouth 2 (two) times daily.   metoCLOPramide 5 MG/5ML solution Commonly known as:  REGLAN Take 5 mLs (5 mg total) by mouth 3 (three) times daily before meals.   polyethylene glycol packet Commonly known as:  MIRALAX / GLYCOLAX Take 17 g by mouth daily as needed for mild constipation.   silver sulfADIAZINE 1 % cream Commonly known as:  SILVADENE Apply 1 application topically daily as needed (sores).   tamsulosin 0.4 MG Caps capsule Commonly known as:  FLOMAX Take 1 capsule (0.4 mg total) by mouth daily after  supper. What changed:  when to take this   timolol 0.5 % ophthalmic solution Commonly known as:  BETIMOL Place 1 drop into both eyes 2 (two) times daily.   Travoprost (BAK Free) 0.004 % Soln ophthalmic solution Commonly known as:  [name] Place 1 drop into both eyes at bedtime.   venlafaxine XR 75 MG 24 hr capsule Commonly known as:  EFFEXOR-XR Take 75 mg by mouth daily.   vitamin C 500 MG tablet Commonly known as:  ASCORBIC ACID Take 500 mg by mouth daily.      Allergies  Allergen Reactions  . Codeine Anaphylaxis  . Penicillins Anaphylaxis and Other (See Comments)    PATIENT HAD A PCN REACTION WITH IMMEDIATE RASH, FACIAL/TONGUE/THROAT SWELLING, SOB, OR LIGHTHEADEDNESS WITH HYPOTENSION:  #  #  #  YES  #  #  #  Has patient had a PCN reaction causing severe rash involving mucus membranes or skin necrosis: No Has patient had a PCN reaction that required hospitalization: Unknown Has patient had a PCN reaction occurring within the last 10 years: No If all of the above answers are "NO", then may proceed with Cephalosporin use.    Follow-up Information    Deland Pretty, MD Follow  up in 1 month(s).   Specialty:  Internal Medicine Contact information: [address] [place] [address] [phone]        Josue Hector, MD .   Specialty:  Cardiology Contact information: [phone] [address] [place] [address] [phone]            The results of significant diagnostics from this hospitalization (including imaging, microbiology, ancillary and laboratory) are listed below for reference.    Significant Diagnostic Studies: Dg Chest 2 View  Result Date: 05/06/2017 CLINICAL DATA:  New onset weakness. EXAM: CHEST  2 VIEW COMPARISON:  07/02/2015 FINDINGS: The cardiomediastinal silhouette is within normal limits. The lungs are well inflated and clear. No pleural effusion or pneumothorax is identified. Old right rib fractures are noted. IMPRESSION: No active cardiopulmonary disease. Electronically Signed   By: Logan Bores M.D.   On: 05/06/2017 20:14   Mr Foot Right W/o Contrast  Result Date: 04/28/2017 CLINICAL DATA:  Diabetic foot ulcer in the medial plantar foot near the arch. Evaluate for osteomyelitis. EXAM: MRI OF THE RIGHT FOREFOOT WITHOUT CONTRAST TECHNIQUE: Multiplanar, multisequence MR imaging of the right forefoot was performed. No intravenous contrast was administered. COMPARISON:  Right foot x-rays dated March 14, 2013. FINDINGS: Bones/Joint/Cartilage Prior third through fifth transmetatarsal amputations. Prior amputation of the second toe. No suspicious marrow signal abnormality to suggest osteomyelitis. Degenerative marrow edema and cystic change in the first metatarsal head, first proximal phalanx, and calcaneocuboid and naviculocuneiform joints. Prior ankylosis of the first and second tarsometatarsal joints. Moderate hallux valgus deformity. No fracture or dislocation. No joint effusion. Cartilage loss and subchondral marrow edema in the posterior tibiotalar joint. Small amount of marrow edema in the lateral talar  process. Pes planus. Ligaments First digit collateral ligaments are intact. Muscles and Tendons No tenosynovitis. Diffuse fatty atrophy of the intrinsic muscles of the forefoot. Soft tissue Soft tissue ulceration along the medial plantar foot near the navicular. Mild diffuse soft tissue edema about the forefoot. No fluid collection. No soft tissue mass. IMPRESSION: 1. Soft tissue ulceration along the medial plantar foot near the navicular bone, without evidence of osteomyelitis. No drainable fluid collection. 2. Multiple prior amputations. Neuropathic arthropathy of the midfoot. Electronically Signed   By: Gwyndolyn Saxon  Marzella Schlein M.D.   On: 04/28/2017 08:21   Dg Abd Portable 1v  Result Date: 05/07/2017 CLINICAL DATA:  Ileus.  Periumbilical pain. EXAM: PORTABLE ABDOMEN - 1 VIEW COMPARISON:  None. FINDINGS: A large amount of fecal retention is seen within the colon. No small bowel dilatation is noted. There is lower lumbar facet arthropathy from L4 through S1. 3 cannulated screws traverse the left femoral neck. No free air, organomegaly or radiopaque calculi. IMPRESSION: Increased colonic stool burden consistent with constipation. Lower lumbar facet arthropathy. Three cannulated screws traverse a remote subcapital left femoral neck fracture. Electronically Signed   By: Ashley Royalty M.D.   On: 05/07/2017 21:40   Xr Foot 2 Views Right  Result Date: 04/23/2017 Radiographs of right foot show chronic charcot collapse with silver nitrate probing to the navicular. Ray amputations of the 3, 4 and 5th toes.    Microbiology: No results found for this or any previous visit (from the past 240 hour(s)).   Labs: Basic Metabolic Panel: Recent Labs  Lab 05/04/17 0713 05/06/17 1931 05/06/17 2345  NA 144 137  --   K 4.8 4.8  --   CL 109 107  --   CO2 20* 18*  --   GLUCOSE 261* 438*  --   BUN 80* 77*  --   CREATININE 3.14* 3.04* 2.90*  CALCIUM 9.9 9.3  --    Liver Function Tests: Recent Labs  Lab 05/06/17 1931    AST 22  ALT 15*  ALKPHOS 96  BILITOT 0.9  PROT 6.7  ALBUMIN 3.3*   No results for input(s): LIPASE, AMYLASE in the last 168 hours. No results for input(s): AMMONIA in the last 168 hours. CBC: Recent Labs  Lab 05/02/17 1116 05/04/17 0713 05/06/17 2040 05/06/17 2345  WBC  --  6.7 8.9 8.5  NEUTROABS  --   --  7.4  --   HGB 11.0* 11.8* 11.8* 11.2*  HCT  --  35.4* 35.9* 34.9*  MCV  --  89.4 89.5 89.5  PLT  --  234 230 203   Cardiac Enzymes: Recent Labs  Lab 05/06/17 2345 05/07/17 0246 05/07/17 0514  TROPONINI <0.03 <0.03 <0.03   BNP: BNP (last 3 results) Recent Labs    05/06/17 2042  BNP 12.7    ProBNP (last 3 results) Recent Labs    05/09/16 0000  PROBNP 123    CBG: Recent Labs  Lab 05/07/17 2016 05/07/17 2332 05/08/17 0429 05/08/17 0810 05/08/17 1109  GLUCAP 187* 115* 92 98 140*       Signed:  Annita Brod, MD [facility] 05/08/2017, 1:51 PM

## 2017-05-08 NOTE — Care Management Note (Signed)
Case Management Note  Patient Details  Name: [name] MRN: [id number] Date of Birth: [birth year]  Subjective/Objective: Pt presented for Chest Pain- Resolved. PTA from home with wife. Pt has hx Left BKA- prosthesis at bedside via [employer]. Per wife the prosthesis has been falling off since pt has lost weight. Last Friday pt had orthopedic surgery on root foot ulcer- per wife non weightbearing.                   Action/Plan: CM did make recommendations to use the wheelchair in the home to make sure pt is safe moving around in the home. Pt has ramp. CM did suggest 3n1 close to bed at night and to call [employer] to see if prosthesis-can be re-fit. CM did call [employer] in regards to patient's prosthesis. Patient to see MD in regards to why appointment is needed and then patient to call [employer] for appointment for prosthesis adjustment. No further needs from CM at this time.   Expected Discharge Date:  05/08/17               Expected Discharge Plan:  Home/Self Care  In-House Referral:  NA  Discharge planning Services  NA  Post Acute Care Choice:  NA Choice offered to:  NA  DME Arranged:  N/A DME Agency:  NA  HH Arranged:  NA HH Agency:  NA  Status of Service:  Completed, signed off  If discussed at [place] of Stay Meetings, dates discussed:    Additional Comments:  Bethena Roys, RN 05/08/2017, 3:31 PM

## 2017-05-08 NOTE — Progress Notes (Signed)
Progress Note  Patient Name: [name] Date of Encounter: 05/08/2017  Primary Cardiologist: [name], MD   Subjective   No chest pain   Inpatient Medications    Scheduled Meds: . aspirin  325 mg Oral Daily  . atorvastatin  20 mg Oral QPM  . carvedilol  6.25 mg Oral BID WC  . clopidogrel  75 mg Oral Daily  . docusate sodium  100 mg Oral BID  . doxycycline  100 mg Oral BID  . furosemide  40 mg Oral Daily  . gabapentin  400 mg Oral QHS  . heparin  5,000 Units Subcutaneous Q8H  . insulin aspart  0-20 Units Subcutaneous Q4H  . insulin detemir  6 Units Subcutaneous QHS  . isosorbide-hydrALAZINE  1 tablet Oral BID  . lactulose  10 g Oral NOW  . lamoTRIgine  100 mg Oral BID  . latanoprost  1 drop Both Eyes QHS  . memantine  10 mg Oral BID  . metoCLOPramide (REGLAN) injection  5 mg Intravenous Q6H  . multivitamin with minerals  1 tablet Oral Daily  . tamsulosin  0.4 mg Oral BID  . timolol  1 drop Both Eyes BID  . venlafaxine XR  75 mg Oral Daily  . vitamin C  500 mg Oral Daily   Continuous Infusions:  PRN Meds: acetaminophen, acetaminophen, lidocaine, ondansetron (ZOFRAN) IV, polyethylene glycol, silver sulfADIAZINE   Vital Signs    Vitals:   05/07/17 1141 05/07/17 1652 05/07/17 2019 05/08/17 0431  BP: (!) 149/89 130/80 116/61 109/67  Pulse: 79 71 73 73  Resp: 18 17 18 17   Temp: 98.7 F (37.1 C) 98.9 F (37.2 C) 98.3 F (36.8 C) 98.9 F (37.2 C)  TempSrc: Oral Oral Oral Oral  SpO2: 99%  99% 100%  Weight:    128 lb 8.5 oz (58.3 kg)  Height: 5\' 10"  (1.778 m)       Intake/Output Summary (Last 24 hours) at 05/08/2017 0831 Last data filed at 05/08/2017 0432 Gross per 24 hour  Intake 120 ml  Output 625 ml  Net -505 ml   Filed Weights   05/06/17 1857 05/08/17 0431  Weight: 145 lb (65.8 kg) 128 lb 8.5 oz (58.3 kg)    Telemetry    NSR 05/08/2017  - Personally Reviewed  ECG    SR normal ECG  - Personally Reviewed  Physical Exam  Frail thin black  male  GEN: No acute distress.   Neck: No JVD Cardiac: RRR, no murmurs, rubs, or gallops.  Respiratory: Clear to auscultation bilaterally. GI: Soft, nontender, non-distended  MS: No edema; No deformity. Neuro:  Nonfocal  Psych: Normal affect  Left BKA Recent surgery Right foot for diabetic foot ulcer   Labs    Chemistry Recent Labs  Lab 05/04/17 0713 05/06/17 1931 05/06/17 2345  NA 144 137  --   K 4.8 4.8  --   CL 109 107  --   CO2 20* 18*  --   GLUCOSE 261* 438*  --   BUN 80* 77*  --   CREATININE 3.14* 3.04* 2.90*  CALCIUM 9.9 9.3  --   PROT  --  6.7  --   ALBUMIN  --  3.3*  --   AST  --  22  --   ALT  --  15*  --   ALKPHOS  --  96  --   BILITOT  --  0.9  --   GFRNONAA 19* 19* 20*  GFRAA 22*  22* 24*  ANIONGAP 15 12  --      Hematology Recent Labs  Lab 05/04/17 0713 05/06/17 2040 05/06/17 2345  WBC 6.7 8.9 8.5  RBC 3.96* 4.01* 3.90*  HGB 11.8* 11.8* 11.2*  HCT 35.4* 35.9* 34.9*  MCV 89.4 89.5 89.5  MCH 29.8 29.4 28.7  MCHC 33.3 32.9 32.1  RDW 12.7 12.5 12.6  PLT 234 230 203    Cardiac Enzymes Recent Labs  Lab 05/06/17 2345 05/07/17 0246 05/07/17 0514  TROPONINI <0.03 <0.03 <0.03    Recent Labs  Lab 05/06/17 2047  TROPIPOC 0.00     BNP Recent Labs  Lab 05/06/17 2042  BNP 12.7     DDimer No results for input(s): DDIMER in the last 168 hours.   Radiology    Dg Chest 2 View  Result Date: 05/06/2017 CLINICAL DATA:  New onset weakness. EXAM: CHEST  2 VIEW COMPARISON:  07/02/2015 FINDINGS: The cardiomediastinal silhouette is within normal limits. The lungs are well inflated and clear. No pleural effusion or pneumothorax is identified. Old right rib fractures are noted. IMPRESSION: No active cardiopulmonary disease. Electronically Signed   By: Logan Bores M.D.   On: 05/06/2017 20:14   Dg Abd Portable 1v  Result Date: 05/07/2017 CLINICAL DATA:  Ileus.  Periumbilical pain. EXAM: PORTABLE ABDOMEN - 1 VIEW COMPARISON:  None. FINDINGS: A large  amount of fecal retention is seen within the colon. No small bowel dilatation is noted. There is lower lumbar facet arthropathy from L4 through S1. 3 cannulated screws traverse the left femoral neck. No free air, organomegaly or radiopaque calculi. IMPRESSION: Increased colonic stool burden consistent with constipation. Lower lumbar facet arthropathy. Three cannulated screws traverse a remote subcapital left femoral neck fracture. Electronically Signed   By: Ashley Royalty M.D.   On: 05/07/2017 21:40    Cardiac Studies   Echo 10/30/16 EF 50-55%   Patient Profile     72 y.o. male  CKD 4, DM, Left BKA , HTN with atypical chest pain post surgery on right foot.   Assessment & Plan    1. Chest pain:  R/O normal ECG given CRF and Cr near 3 no plans for further ischemic testing suspect muscular pain from issues getting around on 2 bad legs 2. DM:  Discussed low carb diet.  Target hemoglobin A1c is 6.5 or less.  Continue current medications. 3. PT/OT has prosthetic LLE leg and now unable to weight bear on right foot for 3 weeks needs likely inpatient rehab stay  4. HLD:  Continue statin  5. CVA:  History of continue ASA and plavix   For questions or updates, please contact [place] Please consult [email or web address] for contact info under Cardiology/STEMI.      Signed, [name], MD  05/08/2017, 8:31 AM

## 2017-05-08 NOTE — Care Management Obs Status (Signed)
[place] NOTIFICATION   Patient Details  Name: [name] MRN: [id number] Date of Birth: 08-19-1945   Medicare Observation Status Notification Given:  Yes    Bethena Roys, RN 05/08/2017, 2:59 PM

## 2017-05-08 NOTE — Plan of Care (Signed)
  Progressing Clinical Measurements: Respiratory complications will improve 05/08/2017 0536 - Progressing by Colonel Bald, RN Note No s/s of respiratory complications. Coping: Level of anxiety will decrease 05/08/2017 0536 - Progressing by Colonel Bald, RN Note No s/s of anxiety noted.

## 2017-05-10 NOTE — Progress Notes (Signed)
Patient ID: [name], male   DOB: 1946-01-04, 72 y.o.   MRN: [id number]   72 y.o. history of DM, CKD stage 3 with Cr 2.9 fistula placed RUE May 2018, Seizures, left BKA from osteomyelitis Memory dementia worse not improved with Namenda April 2017 syncope thought vasovagal   Echo reviewed 10/30/16 EF 50-55% mild LAE no significant valve Dx Carotids reviewed 12/11/16 plaque no stenosis   Seen in hospital 05/07/17 for chest pain.  Had just had surgery RLE foot for diabetic ulcer and had a hard time getting Around with two bad legs. BS in 400's He r/o no acute ECG changes Plan for outpatient myovue after surgical recovery Last myovue 2014 non ischemic   Since d/c no pains Leg healing seeing [name] as outpatient    ROS: Denies fever, malais, weight loss, blurry vision, decreased visual acuity, cough, sputum, SOB, hemoptysis, pleuritic pain, palpitaitons, heartburn, abdominal pain, melena, lower extremity edema, claudication, or rash.  All other systems reviewed and negative  General: BP (!) 110/58   Pulse 77   Ht 5\' 10"  (1.778 m)   SpO2 97%   BMI 17.52 kg/m  Affect appropriate Chronically ill black male  HEENT: normal Neck supple with no adenopathy JVP normal no bruits no thyromegaly Lungs clear with no wheezing and good diaphragmatic motion Heart:  S1/S2 no murmur, no rub, gallop or click PMI normal Abdomen: benighn, BS positve, no tenderness, no AAA no bruit.  No HSM or HJR No edema Neuro non-focal Skin warm and dry Left BKA  Charcot right foot with recent surgery  RUE fistula with thrill     Current Outpatient Medications  Medication Sig Dispense Refill  . acetaminophen (TYLENOL) 500 MG tablet Take 500 mg by mouth 2 (two) times daily as needed for moderate pain or headache.    . ASPERCREME LIDOCAINE EX Apply 1 application topically daily as needed (for pain).     [name] aspirin 325 MG tablet Take 325 mg by mouth daily.    [name] atorvastatin (LIPITOR) 20 MG tablet Take 20 mg by  mouth every evening.     . carvedilol (COREG) 6.25 MG tablet Take 1 tablet (6.25 mg total) by mouth 2 (two) times daily with a meal. 60 tablet 0  . clopidogrel (PLAVIX) 75 MG tablet Take 1 tablet (75 mg total) by mouth daily. 30 tablet 0  . docusate sodium (COLACE) 100 MG capsule Take 1 capsule (100 mg total) by mouth 2 (two) times daily. 10 capsule 0  . doxycycline (VIBRA-TABS) 100 MG tablet Take 1 tablet (100 mg total) by mouth 2 (two) times daily. 60 tablet 0  . furosemide (LASIX) 40 MG tablet Take 1 tablet (40 mg total) by mouth daily.    [name] gabapentin (NEURONTIN) 100 MG capsule Take 400 mg by mouth at bedtime.     . insulin detemir (LEVEMIR) 100 UNIT/ML injection Inject 0.05 mLs (5 Units total) into the skin at bedtime. (Patient taking differently: Inject 6 Units into the skin at bedtime. )    . insulin lispro (HUMALOG) 100 UNIT/ML injection Inject 2 Units into the skin 3 (three) times daily before meals.    . isosorbide-hydrALAZINE (BIDIL) 20-37.5 MG tablet Take 1 tablet by mouth 2 (two) times daily. 60 tablet 6  . lamoTRIgine (LAMICTAL) 100 MG tablet Take 1 tablet (100 mg total) by mouth 2 (two) times daily. 180 tablet 3  . memantine (NAMENDA) 10 MG tablet Take 1 tablet (10 mg total) by mouth 2 (two) times daily.  180 tablet 3  . metoCLOPramide (REGLAN) 5 MG/5ML solution Take 5 mLs (5 mg total) by mouth 3 (three) times daily before meals. 120 mL 0  . Multiple Vitamins-Minerals (CENTRUM SILVER ADULT 50+ PO) Take 1 tablet by mouth daily.     . polyethylene glycol (MIRALAX / GLYCOLAX) packet Take 17 g by mouth daily as needed for mild constipation.    . silver sulfADIAZINE (SILVADENE) 1 % cream Apply 1 application topically daily as needed (sores).     . tamsulosin (FLOMAX) 0.4 MG CAPS capsule Take 1 capsule (0.4 mg total) by mouth daily after supper. (Patient taking differently: Take 0.4 mg by mouth 2 (two) times daily. ) 30 capsule 5  . timolol (BETIMOL) 0.5 % ophthalmic solution Place 1 drop  into both eyes 2 (two) times daily.     . Travoprost, BAK Free, ([name]) 0.004 % SOLN ophthalmic solution Place 1 drop into both eyes at bedtime.    [name] venlafaxine XR (EFFEXOR-XR) 75 MG 24 hr capsule Take 75 mg by mouth daily.    . vitamin C (ASCORBIC ACID) 500 MG tablet Take 500 mg by mouth daily.     No current facility-administered medications for this visit.     Allergies  Codeine and Penicillins  Electrocardiogram:  12/06/13 SR rate 89 inferior T wave changes 07/14/14  SR rate 65  LVH otherwise normal  Assessment and Plan Chest Pain likely muscular [name] poorly controlled DM f/u lexiscan myovue  CHF:  Dry on exam recent echo 30-55% continue bidil PVD:  F/u vascular stump looks good  Needs f/u ortho Dr [name]  for recent right foot surgery   Chol:  On statin LDL 94 continue  CRF:  Cr close to 2.9  recent fistula RUE f/u renal and VVS    Neuro:  No change in dementia/memory with namenda f/u with neuro  DM:  Biggest issue in regard to long term health and wound healing still poorly controlled F/u primary    F/u with me in a year   [name]

## 2017-05-11 ENCOUNTER — Emergency Department (HOSPITAL_COMMUNITY): Payer: Medicare Other

## 2017-05-11 ENCOUNTER — Encounter: Payer: Self-pay | Admitting: *Deleted

## 2017-05-11 ENCOUNTER — Other Ambulatory Visit: Payer: Self-pay

## 2017-05-11 ENCOUNTER — Observation Stay (HOSPITAL_COMMUNITY)
Admission: EM | Admit: 2017-05-11 | Discharge: 2017-05-14 | Disposition: A | Discharge status: Home / Self Care | Payer: Medicare Other | Attending: Internal Medicine | Admitting: Internal Medicine

## 2017-05-11 DIAGNOSIS — Z8673 Personal history of transient ischemic attack (TIA), and cerebral infarction without residual deficits: Secondary | ICD-10-CM | POA: Diagnosis not present

## 2017-05-11 DIAGNOSIS — E1122 Type 2 diabetes mellitus with diabetic chronic kidney disease: Secondary | ICD-10-CM | POA: Insufficient documentation

## 2017-05-11 DIAGNOSIS — E11610 Type 2 diabetes mellitus with diabetic neuropathic arthropathy: Secondary | ICD-10-CM | POA: Insufficient documentation

## 2017-05-11 DIAGNOSIS — I130 Hypertensive heart and chronic kidney disease with heart failure and stage 1 through stage 4 chronic kidney disease, or unspecified chronic kidney disease: Secondary | ICD-10-CM | POA: Insufficient documentation

## 2017-05-11 DIAGNOSIS — N179 Acute kidney failure, unspecified: Secondary | ICD-10-CM | POA: Insufficient documentation

## 2017-05-11 DIAGNOSIS — Z885 Allergy status to narcotic agent status: Secondary | ICD-10-CM | POA: Insufficient documentation

## 2017-05-11 DIAGNOSIS — N184 Chronic kidney disease, stage 4 (severe): Secondary | ICD-10-CM | POA: Diagnosis present

## 2017-05-11 DIAGNOSIS — R404 Transient alteration of awareness: Secondary | ICD-10-CM | POA: Diagnosis not present

## 2017-05-11 DIAGNOSIS — Z880 Allergy status to penicillin: Secondary | ICD-10-CM | POA: Insufficient documentation

## 2017-05-11 DIAGNOSIS — Z794 Long term (current) use of insulin: Secondary | ICD-10-CM | POA: Diagnosis not present

## 2017-05-11 DIAGNOSIS — L97909 Non-pressure chronic ulcer of unspecified part of unspecified lower leg with unspecified severity: Secondary | ICD-10-CM | POA: Diagnosis present

## 2017-05-11 DIAGNOSIS — Z89512 Acquired absence of left leg below knee: Secondary | ICD-10-CM | POA: Diagnosis not present

## 2017-05-11 DIAGNOSIS — N400 Enlarged prostate without lower urinary tract symptoms: Secondary | ICD-10-CM | POA: Insufficient documentation

## 2017-05-11 DIAGNOSIS — I5042 Chronic combined systolic (congestive) and diastolic (congestive) heart failure: Secondary | ICD-10-CM | POA: Insufficient documentation

## 2017-05-11 DIAGNOSIS — F329 Major depressive disorder, single episode, unspecified: Secondary | ICD-10-CM | POA: Diagnosis not present

## 2017-05-11 DIAGNOSIS — D631 Anemia in chronic kidney disease: Secondary | ICD-10-CM | POA: Insufficient documentation

## 2017-05-11 DIAGNOSIS — E1151 Type 2 diabetes mellitus with diabetic peripheral angiopathy without gangrene: Secondary | ICD-10-CM | POA: Diagnosis not present

## 2017-05-11 DIAGNOSIS — W938XXA Exposure to other excessive cold of man-made origin, initial encounter: Secondary | ICD-10-CM | POA: Insufficient documentation

## 2017-05-11 DIAGNOSIS — G40909 Epilepsy, unspecified, not intractable, without status epilepticus: Secondary | ICD-10-CM | POA: Diagnosis not present

## 2017-05-11 DIAGNOSIS — Z79899 Other long term (current) drug therapy: Secondary | ICD-10-CM | POA: Insufficient documentation

## 2017-05-11 DIAGNOSIS — I700 Atherosclerosis of aorta: Secondary | ICD-10-CM | POA: Diagnosis not present

## 2017-05-11 DIAGNOSIS — F0390 Unspecified dementia without behavioral disturbance: Secondary | ICD-10-CM | POA: Diagnosis not present

## 2017-05-11 DIAGNOSIS — Z7982 Long term (current) use of aspirin: Secondary | ICD-10-CM | POA: Diagnosis not present

## 2017-05-11 DIAGNOSIS — T68XXXA Hypothermia, initial encounter: Secondary | ICD-10-CM | POA: Diagnosis not present

## 2017-05-11 DIAGNOSIS — R400 Somnolence: Secondary | ICD-10-CM | POA: Diagnosis not present

## 2017-05-11 DIAGNOSIS — Z89421 Acquired absence of other right toe(s): Secondary | ICD-10-CM | POA: Insufficient documentation

## 2017-05-11 DIAGNOSIS — R739 Hyperglycemia, unspecified: Secondary | ICD-10-CM | POA: Diagnosis not present

## 2017-05-11 DIAGNOSIS — R7309 Other abnormal glucose: Secondary | ICD-10-CM | POA: Diagnosis not present

## 2017-05-11 DIAGNOSIS — E1165 Type 2 diabetes mellitus with hyperglycemia: Secondary | ICD-10-CM | POA: Diagnosis present

## 2017-05-11 DIAGNOSIS — G9340 Encephalopathy, unspecified: Secondary | ICD-10-CM | POA: Diagnosis not present

## 2017-05-11 DIAGNOSIS — I10 Essential (primary) hypertension: Secondary | ICD-10-CM | POA: Diagnosis present

## 2017-05-11 DIAGNOSIS — Z7902 Long term (current) use of antithrombotics/antiplatelets: Secondary | ICD-10-CM | POA: Diagnosis not present

## 2017-05-11 DIAGNOSIS — R51 Headache: Secondary | ICD-10-CM | POA: Diagnosis not present

## 2017-05-11 DIAGNOSIS — R42 Dizziness and giddiness: Secondary | ICD-10-CM | POA: Diagnosis not present

## 2017-05-11 LAB — CBG MONITORING, ED: Glucose-Capillary: 403 mg/dL — ABNORMAL HIGH (ref 65–99)

## 2017-05-11 MED ORDER — SODIUM CHLORIDE 0.9 % IV BOLUS (SEPSIS)
1000.0000 mL | Freq: Once | INTRAVENOUS | Status: AC
  Administered 2017-05-11: 1000 mL via INTRAVENOUS
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
## 2017-05-11 NOTE — ED Triage Notes (Signed)
Patient arrived via EMS, wife reports "he is talking softly and his blood sugar is high" she gave patient 3units of humalog. Patient denies pain. PT had a toe amputation last Friday(02/15)

## 2017-05-12 ENCOUNTER — Observation Stay (HOSPITAL_COMMUNITY): Payer: Medicare Other

## 2017-05-12 DIAGNOSIS — G9340 Encephalopathy, unspecified: Secondary | ICD-10-CM | POA: Diagnosis not present

## 2017-05-12 DIAGNOSIS — T68XXXA Hypothermia, initial encounter: Secondary | ICD-10-CM | POA: Diagnosis not present

## 2017-05-12 DIAGNOSIS — E1165 Type 2 diabetes mellitus with hyperglycemia: Secondary | ICD-10-CM | POA: Diagnosis present

## 2017-05-12 DIAGNOSIS — R4020 Unspecified coma: Secondary | ICD-10-CM | POA: Diagnosis not present

## 2017-05-12 LAB — COMPREHENSIVE METABOLIC PANEL
ALK PHOS: 94 U/L (ref 38–126)
ALT: 15 U/L — ABNORMAL LOW (ref 17–63)
ANION GAP: 12 (ref 5–15)
AST: 18 U/L (ref 15–41)
Albumin: 2.8 g/dL — ABNORMAL LOW (ref 3.5–5.0)
BILIRUBIN TOTAL: 0.8 mg/dL (ref 0.3–1.2)
BUN: 108 mg/dL — ABNORMAL HIGH (ref 6–20)
CALCIUM: 8.9 mg/dL (ref 8.9–10.3)
CO2: 18 mmol/L — ABNORMAL LOW (ref 22–32)
CREATININE: 3.54 mg/dL — AB (ref 0.61–1.24)
Chloride: 109 mmol/L (ref 101–111)
GFR calc Af Amer: 19 mL/min — ABNORMAL LOW (ref >60)
GFR, EST NON AFRICAN AMERICAN: 16 mL/min — AB (ref >60)
Glucose, Bld: 423 mg/dL — ABNORMAL HIGH (ref 65–99)
Potassium: 5.1 mmol/L (ref 3.5–5.1)
Sodium: 139 mmol/L (ref 135–145)
TOTAL PROTEIN: 5.8 g/dL — AB (ref 6.5–8.1)

## 2017-05-12 LAB — CBC WITH DIFFERENTIAL/PLATELET
Basophils Absolute: 0 10*3/uL (ref 0.0–0.1)
Basophils Relative: 0 %
EOS ABS: 0 10*3/uL (ref 0.0–0.7)
Eosinophils Relative: 0 %
HEMATOCRIT: 31.5 % — AB (ref 39.0–52.0)
HEMOGLOBIN: 10.4 g/dL — AB (ref 13.0–17.0)
LYMPHS ABS: 0.7 10*3/uL (ref 0.7–4.0)
LYMPHS PCT: 9 %
MCH: 29.5 pg (ref 26.0–34.0)
MCHC: 33 g/dL (ref 30.0–36.0)
MCV: 89.2 fL (ref 78.0–100.0)
MONOS PCT: 4 %
Monocytes Absolute: 0.3 10*3/uL (ref 0.1–1.0)
NEUTROS ABS: 7.6 10*3/uL (ref 1.7–7.7)
NEUTROS PCT: 87 %
Platelets: 228 10*3/uL (ref 150–400)
RBC: 3.53 MIL/uL — ABNORMAL LOW (ref 4.22–5.81)
RDW: 12.8 % (ref 11.5–15.5)
WBC: 8.7 10*3/uL (ref 4.0–10.5)

## 2017-05-12 LAB — CBC
HCT: 31.3 % — ABNORMAL LOW (ref 39.0–52.0)
Hemoglobin: 9.8 g/dL — ABNORMAL LOW (ref 13.0–17.0)
MCH: 28.3 pg (ref 26.0–34.0)
MCHC: 31.3 g/dL (ref 30.0–36.0)
MCV: 90.5 fL (ref 78.0–100.0)
PLATELETS: 211 10*3/uL (ref 150–400)
RBC: 3.46 MIL/uL — ABNORMAL LOW (ref 4.22–5.81)
RDW: 13.1 % (ref 11.5–15.5)
WBC: 8 10*3/uL (ref 4.0–10.5)

## 2017-05-12 LAB — CORTISOL: Cortisol, Plasma: 8.9 ug/dL

## 2017-05-12 LAB — RAPID URINE DRUG SCREEN, HOSP PERFORMED
Amphetamines: NOT DETECTED
Barbiturates: NOT DETECTED
Benzodiazepines: NOT DETECTED
COCAINE: NOT DETECTED
OPIATES: NOT DETECTED
TETRAHYDROCANNABINOL: NOT DETECTED

## 2017-05-12 LAB — URINALYSIS, ROUTINE W REFLEX MICROSCOPIC
Bacteria, UA: NONE SEEN
Bilirubin Urine: NEGATIVE
Glucose, UA: >=500 mg/dL — AB
Hgb urine dipstick: NEGATIVE
KETONES UR: NEGATIVE mg/dL
Leukocytes, UA: NEGATIVE
Nitrite: NEGATIVE
PH: 5 (ref 5.0–8.0)
Protein, ur: 30 mg/dL — AB
SPECIFIC GRAVITY, URINE: 1.009 (ref 1.005–1.030)
Squamous Epithelial / LPF: NONE SEEN
WBC UA: NONE SEEN WBC/hpf (ref 0–5)

## 2017-05-12 LAB — BASIC METABOLIC PANEL
Anion gap: 13 (ref 5–15)
Anion gap: 10 (ref 5–15)
BUN: 104 mg/dL — AB (ref 6–20)
BUN: 88 mg/dL — AB (ref 6–20)
CALCIUM: 9 mg/dL (ref 8.9–10.3)
CO2: 19 mmol/L — ABNORMAL LOW (ref 22–32)
CO2: 22 mmol/L (ref 22–32)
CREATININE: 3.46 mg/dL — AB (ref 0.61–1.24)
CREATININE: 3.04 mg/dL — AB (ref 0.61–1.24)
Calcium: 9.4 mg/dL (ref 8.9–10.3)
Chloride: 111 mmol/L (ref 101–111)
Chloride: 112 mmol/L — ABNORMAL HIGH (ref 101–111)
GFR calc Af Amer: 19 mL/min — ABNORMAL LOW (ref >60)
GFR calc Af Amer: 22 mL/min — ABNORMAL LOW (ref >60)
GFR, EST NON AFRICAN AMERICAN: 17 mL/min — AB (ref >60)
GFR, EST NON AFRICAN AMERICAN: 19 mL/min — AB (ref >60)
Glucose, Bld: 352 mg/dL — ABNORMAL HIGH (ref 65–99)
Glucose, Bld: 126 mg/dL — ABNORMAL HIGH (ref 65–99)
Potassium: 4.5 mmol/L (ref 3.5–5.1)
Potassium: 4.7 mmol/L (ref 3.5–5.1)
SODIUM: 143 mmol/L (ref 135–145)
SODIUM: 144 mmol/L (ref 135–145)

## 2017-05-12 LAB — I-STAT TROPONIN, ED: Troponin i, poc: 0 ng/mL (ref 0.00–0.08)

## 2017-05-12 LAB — I-STAT VENOUS BLOOD GAS, ED
ACID-BASE DEFICIT: 3 mmol/L — AB (ref 0.0–2.0)
Bicarbonate: 23.8 mmol/L (ref 20.0–28.0)
O2 SAT: 52 %
Patient temperature: 93.6
TCO2: 25 mmol/L (ref 22–32)
pCO2, Ven: 44.5 mmHg (ref 44.0–60.0)
pH, Ven: 7.322 (ref 7.250–7.430)
pO2, Ven: 26 mmHg — CL (ref 32.0–45.0)

## 2017-05-12 LAB — GLUCOSE, CAPILLARY: Glucose-Capillary: 155 mg/dL — ABNORMAL HIGH (ref 65–99)

## 2017-05-12 LAB — CBG MONITORING, ED
GLUCOSE-CAPILLARY: 364 mg/dL — AB (ref 65–99)
GLUCOSE-CAPILLARY: 328 mg/dL — AB (ref 65–99)
GLUCOSE-CAPILLARY: 89 mg/dL (ref 65–99)

## 2017-05-12 LAB — TSH: TSH: 0.732 u[IU]/mL (ref 0.350–4.500)

## 2017-05-12 LAB — I-STAT CG4 LACTIC ACID, ED: Lactic Acid, Venous: 1.59 mmol/L (ref 0.5–1.9)

## 2017-05-12 LAB — AMMONIA
Ammonia: 15 umol/L (ref 9–35)
Ammonia: 20 umol/L (ref 9–35)

## 2017-05-12 LAB — LIPASE, BLOOD: LIPASE: 23 U/L (ref 11–51)

## 2017-05-12 MED ORDER — INSULIN DETEMIR 100 UNIT/ML ~~LOC~~ SOLN
8.0000 [IU] | Freq: Every day | SUBCUTANEOUS | Status: DC
  Filled 2017-05-12: qty 0.08

## 2017-05-12 MED ORDER — METOCLOPRAMIDE HCL 5 MG PO TABS
5.0000 mg | ORAL_TABLET | Freq: Three times a day (TID) | ORAL | Status: DC
  Administered 2017-05-12 – 2017-05-14 (×6): 5 mg via ORAL
  Filled 2017-05-12 (×9): qty 1

## 2017-05-12 MED ORDER — DOXYCYCLINE HYCLATE 100 MG PO TABS
100.0000 mg | ORAL_TABLET | Freq: Two times a day (BID) | ORAL | Status: DC
  Administered 2017-05-12 – 2017-05-14 (×5): 100 mg via ORAL
  Filled 2017-05-12 (×5): qty 1

## 2017-05-12 MED ORDER — CARVEDILOL 6.25 MG PO TABS
6.2500 mg | ORAL_TABLET | Freq: Two times a day (BID) | ORAL | Status: DC
  Administered 2017-05-12 – 2017-05-14 (×5): 6.25 mg via ORAL
  Filled 2017-05-12 (×5): qty 1

## 2017-05-12 MED ORDER — DOCUSATE SODIUM 100 MG PO CAPS
100.0000 mg | ORAL_CAPSULE | Freq: Two times a day (BID) | ORAL | Status: DC
  Administered 2017-05-12 – 2017-05-14 (×5): 100 mg via ORAL
  Filled 2017-05-12 (×5): qty 1

## 2017-05-12 MED ORDER — INSULIN ASPART 100 UNIT/ML ~~LOC~~ SOLN
0.0000 [IU] | Freq: Three times a day (TID) | SUBCUTANEOUS | Status: DC
  Administered 2017-05-12: 11 [IU] via SUBCUTANEOUS
  Administered 2017-05-13: 5 [IU] via SUBCUTANEOUS
  Administered 2017-05-13 – 2017-05-14 (×2): 3 [IU] via SUBCUTANEOUS
  Filled 2017-05-12: qty 1

## 2017-05-12 MED ORDER — MEMANTINE HCL 10 MG PO TABS
10.0000 mg | ORAL_TABLET | Freq: Two times a day (BID) | ORAL | Status: DC
  Administered 2017-05-12 – 2017-05-14 (×5): 10 mg via ORAL
  Filled 2017-05-12 (×7): qty 1

## 2017-05-12 MED ORDER — ACETAMINOPHEN 650 MG RE SUPP: 650.0000 mg | Freq: Four times a day (QID) | RECTAL | Status: DC | PRN

## 2017-05-12 MED ORDER — ASPIRIN 325 MG PO TABS
325.0000 mg | ORAL_TABLET | Freq: Every day | ORAL | Status: DC
  Administered 2017-05-12 – 2017-05-14 (×3): 325 mg via ORAL
  Filled 2017-05-12 (×3): qty 1

## 2017-05-12 MED ORDER — ATORVASTATIN CALCIUM 20 MG PO TABS
20.0000 mg | ORAL_TABLET | Freq: Every evening | ORAL | Status: DC
  Administered 2017-05-12 – 2017-05-13 (×2): 20 mg via ORAL
  Filled 2017-05-12 (×2): qty 1

## 2017-05-12 MED ORDER — ONDANSETRON HCL 4 MG/2ML IJ SOLN: 4.0000 mg | Freq: Four times a day (QID) | INTRAMUSCULAR | Status: DC | PRN

## 2017-05-12 MED ORDER — TIMOLOL MALEATE 0.5 % OP SOLN
1.0000 [drp] | Freq: Two times a day (BID) | OPHTHALMIC | Status: DC
Start: 2017-05-12 — End: 2017-05-14
  Administered 2017-05-12 – 2017-05-14 (×5): 1 [drp] via OPHTHALMIC
  Filled 2017-05-12 (×2): qty 5

## 2017-05-12 MED ORDER — CLOPIDOGREL BISULFATE 75 MG PO TABS
75.0000 mg | ORAL_TABLET | Freq: Every day | ORAL | Status: DC
  Administered 2017-05-12 – 2017-05-14 (×3): 75 mg via ORAL
  Filled 2017-05-12 (×3): qty 1

## 2017-05-12 MED ORDER — SODIUM CHLORIDE 0.9 % IV BOLUS (SEPSIS): 1000.0000 mL | Freq: Once | INTRAVENOUS | Status: DC

## 2017-05-12 MED ORDER — TAMSULOSIN HCL 0.4 MG PO CAPS
0.4000 mg | ORAL_CAPSULE | Freq: Two times a day (BID) | ORAL | Status: DC
  Administered 2017-05-12 – 2017-05-14 (×5): 0.4 mg via ORAL
  Filled 2017-05-12 (×5): qty 1

## 2017-05-12 MED ORDER — LATANOPROST 0.005 % OP SOLN
1.0000 [drp] | Freq: Every day | OPHTHALMIC | Status: DC
  Administered 2017-05-12 – 2017-05-13 (×2): 1 [drp] via OPHTHALMIC
  Filled 2017-05-12: qty 2.5

## 2017-05-12 MED ORDER — ISOSORB DINITRATE-HYDRALAZINE 20-37.5 MG PO TABS
1.0000 | ORAL_TABLET | Freq: Two times a day (BID) | ORAL | Status: DC
  Administered 2017-05-12 – 2017-05-14 (×4): 1 via ORAL
  Filled 2017-05-12 (×4): qty 1

## 2017-05-12 MED ORDER — VENLAFAXINE HCL ER 75 MG PO CP24
75.0000 mg | ORAL_CAPSULE | Freq: Every day | ORAL | Status: DC
  Administered 2017-05-12 – 2017-05-14 (×3): 75 mg via ORAL
  Filled 2017-05-12 (×4): qty 1

## 2017-05-12 MED ORDER — HEPARIN SODIUM (PORCINE) 5000 UNIT/ML IJ SOLN
5000.0000 [IU] | Freq: Three times a day (TID) | INTRAMUSCULAR | Status: DC
  Administered 2017-05-12 – 2017-05-14 (×5): 5000 [IU] via SUBCUTANEOUS
  Filled 2017-05-12 (×5): qty 1

## 2017-05-12 MED ORDER — ISOSORB DINITRATE-HYDRALAZINE 20-37.5 MG PO TABS
1.0000 | ORAL_TABLET | Freq: Two times a day (BID) | ORAL | Status: DC
  Filled 2017-05-12 (×2): qty 1

## 2017-05-12 MED ORDER — LAMOTRIGINE 100 MG PO TABS
100.0000 mg | ORAL_TABLET | Freq: Two times a day (BID) | ORAL | Status: DC
  Administered 2017-05-12 – 2017-05-14 (×5): 100 mg via ORAL
  Filled 2017-05-12 (×7): qty 1

## 2017-05-12 MED ORDER — VITAMIN C 500 MG PO TABS
500.0000 mg | ORAL_TABLET | Freq: Every day | ORAL | Status: DC
  Administered 2017-05-12 – 2017-05-14 (×3): 500 mg via ORAL
  Filled 2017-05-12 (×3): qty 1

## 2017-05-12 MED ORDER — ACETAMINOPHEN 325 MG PO TABS: 650.0000 mg | ORAL_TABLET | Freq: Four times a day (QID) | ORAL | Status: DC | PRN

## 2017-05-12 MED ORDER — HEPARIN SODIUM (PORCINE) 5000 UNIT/ML IJ SOLN
5000.0000 [IU] | Freq: Three times a day (TID) | INTRAMUSCULAR | Status: DC
  Administered 2017-05-12: 5000 [IU] via SUBCUTANEOUS
  Filled 2017-05-12: qty 1

## 2017-05-12 MED ORDER — INSULIN DETEMIR 100 UNIT/ML ~~LOC~~ SOLN
6.0000 [IU] | Freq: Every day | SUBCUTANEOUS | Status: DC
  Administered 2017-05-12 – 2017-05-13 (×2): 6 [IU] via SUBCUTANEOUS
  Filled 2017-05-12 (×3): qty 0.06

## 2017-05-12 MED ORDER — ONDANSETRON HCL 4 MG PO TABS
4.0000 mg | ORAL_TABLET | Freq: Four times a day (QID) | ORAL | Status: DC | PRN
Start: 2017-05-12 — End: 2017-05-14

## 2017-05-12 MED ORDER — INSULIN DETEMIR 100 UNIT/ML ~~LOC~~ SOLN
6.0000 [IU] | Freq: Every day | SUBCUTANEOUS | Status: DC
  Administered 2017-05-12: 6 [IU] via SUBCUTANEOUS
  Filled 2017-05-12: qty 0.06

## 2017-05-12 NOTE — ED Notes (Signed)
Patient was given a cup of coffee. 

## 2017-05-12 NOTE — Progress Notes (Signed)
PROGRESS NOTE    DESMAN POLAK  EGB:[id number]  DOB: [birth year]  DOA: 05/11/2017 PCP: Deland Pretty, MD Outpatient Specialists:   Hospital course: [name] is a 72 y.o. male with history of chronic combined CHF, chronic kidney disease stage IV, seizure disorder, previous history of stroke with no residual deficits, diabetes mellitus type 2, chronic anemia, recently admitted for chest pain and also had recent surgery for right foot ulcer debridement was brought to the ER the patient by phone that patient was confused, altered and more sleepy than usual. Found to be hypothermic, hyperglycemic but anion gap normal at 12. Bun elevated.   Right foot not superinfected and except for hypothermia, no evidence of sepsis. Temp now normalized   Assessment & Plan:   # Altered mentation, unclear etiology, improving with supportive tx. Post-ictal vs sepsis vs stroke vs metabolic (uremia)? > remote L PCA territory infarcts on MRI; ammonia and TSH normal; hx of seizures.  - EEG pending - prn IVF boluses - monitor, q6h neuro checks - resumed home lamictal - resume home memantine - if not improve in 24 hours, will obtain neuro consult  - strict ins/outs (getting bladder scan now)  # Chronic RLE infection s/p debridement - wound care - resume PO doxycycline  # CKD stage 4 - possible uremia contributing to altered mentation. Baseline Cr likely 2.5-3.0s (progressively worsening renal function in past year) - gentle prn fluid boluses as above - continue to monitor daily BMP - if Cr not improve in 24 hours, will get renal consult   # Hx of strokes  - resume home asa, plavix - rsume lipitor  # DMT2 on insulin - titrating long acting levemir to 8 units (retimed for tonight), likely needs higher dose - sliding scale  # Glaucoma - resume latanoprost  # HTN - resume bidil   DVT prophylaxis: heparin subq Code Status: full Family Communication: wife Disposition Plan: inpatient floor  until mentation improves   Consultants:  none  Procedures:  none  Antimicrobials:   doxycycline  Subjective: - sleepy, denies complaints  Objective: Vitals:   05/12/17 1055 05/12/17 1122 05/12/17 1200 05/12/17 1215  BP:   (!) 162/87   Pulse: 67 71  68  Resp: 17  18 13   Temp:      TempSrc:      SpO2: 100% 100%  100%  Weight:      Height:        Intake/Output Summary (Last 24 hours) at 05/12/2017 1348 Last data filed at 05/12/2017 0336 Gross per 24 hour  Intake 1500 ml  Output -  Net 1500 ml   Filed Weights   05/11/17 2216  Weight: 58.1 kg (128 lb)    Exam:  Patient Vitals for the past 24 hrs:  BP Temp Temp src Pulse Resp SpO2 Height Weight  05/12/17 1215 - - - 68 13 100 % - -  05/12/17 1200 (!) 162/87 - - - 18 - - -  05/12/17 1122 - - - 71 - 100 % - -  05/12/17 1055 - - - 67 17 100 % - -  05/12/17 0930 (!) 119/59 - - 66 12 100 % - -  05/12/17 0900 (!) 102/54 - - 67 12 100 % - -  05/12/17 0830 (!) 148/67 - - 69 17 100 % - -  05/12/17 0730 124/71 - - 74 14 100 % - -  05/12/17 0700 105/69 - - 82 15 100 % - -  05/12/17  0600 129/62 - - 78 13 100 % - -  05/12/17 0530 128/60 - - 76 15 100 % - -  05/12/17 0500 131/63 - - 77 12 100 % - -  05/12/17 0430 121/62 - - 80 14 100 % - -  05/12/17 0400 123/63 - - 84 19 96 % - -  05/12/17 0359 - 98.9 F (37.2 C) Oral - - - - -  05/12/17 0330 119/62 - - 86 13 99 % - -  05/12/17 0300 (!) 114/56 - - 84 15 99 % - -  05/12/17 0200 (!) 125/58 - - 79 14 98 % - -  05/12/17 0100 (!) 106/50 - - 70 15 100 % - -  05/12/17 0003 - (!) 93.6 F (34.2 C) Rectal - - - - -  05/12/17 0000 (!) 129/49 - - 63 13 100 % - -  05/11/17 2341 - (!) 93.6 F (34.2 C) Rectal - - - - -  05/11/17 2330 (!) 152/56 - - 61 15 100 % - -  05/11/17 2300 138/61 - - 63 11 100 % - -  05/11/17 2230 135/61 - - 63 18 100 % - -  05/11/17 2216 - - - - - - 5\' 10"  (1.778 m) 58.1 kg (128 lb)  05/11/17 2215 121/77 97.9 F (36.6 C) Oral 62 13 100 % - -  05/11/17  2214 - - - - - 100 % - -  05/11/17 2207 121/77 - - 62 15 100 % - -    GEN  Sleepy, opens eyes to voice and follows commands HEENT NCAT EOM intact PERRL JVP at 6 cm H2O above RA; no carotid bruits CV  RRR normal s1 and s2; no m/r/g RESP CTA b/l ABD soft NT ND +normoactive BS  EXT L BKA and RLE wrapped; no fluctuance or tenderness PULSES radials intact; extremities warm NEURO no gross focal deficits; slow to respond but follows 1 step commands    Data Reviewed: Basic Metabolic Panel: Recent Labs  Lab 05/06/17 1931 05/06/17 2345 05/12/17 0006 05/12/17 0641  NA 137  --  139 143  K 4.8  --  5.1 4.5  CL 107  --  109 111  CO2 18*  --  18* 19*  GLUCOSE 438*  --  423* 352*  BUN 77*  --  108* 104*  CREATININE 3.04* 2.90* 3.54* 3.46*  CALCIUM 9.3  --  8.9 9.0   Liver Function Tests: Recent Labs  Lab 05/06/17 1931 05/12/17 0006  AST 22 18  ALT 15* 15*  ALKPHOS 96 94  BILITOT 0.9 0.8  PROT 6.7 5.8*  ALBUMIN 3.3* 2.8*   Recent Labs  Lab 05/12/17 0006  LIPASE 23   Recent Labs  Lab 05/12/17 0006 05/12/17 0408  AMMONIA 15 20   CBC: Recent Labs  Lab 05/06/17 2040 05/06/17 2345 05/12/17 0006 05/12/17 0641  WBC 8.9 8.5 8.7 8.0  NEUTROABS 7.4  --  7.6  --   HGB 11.8* 11.2* 10.4* 9.8*  HCT 35.9* 34.9* 31.5* 31.3*  MCV 89.5 89.5 89.2 90.5  PLT 230 203 228 211   Cardiac Enzymes: Recent Labs  Lab 05/06/17 2345 05/07/17 0246 05/07/17 0514  TROPONINI <0.03 <0.03 <0.03   BNP (last 3 results) No results for input(s): PROBNP in the last 8760 hours. CBG: Recent Labs  Lab 05/08/17 0810 05/08/17 1109 05/11/17 2152 05/12/17 0441 05/12/17 0736  GLUCAP 98 140* 403* 364* 328*    No results found for this or  any previous visit (from the past 240 hour(s)).       Studies: Dg Chest 2 View  Result Date: 05/11/2017 CLINICAL DATA:  Altered mental status and hyperglycemia EXAM: CHEST  2 VIEW COMPARISON:  05/06/2017 FINDINGS: The heart size and mediastinal  contours are within normal limits. Minimal aortic atherosclerosis without aneurysm. Both lungs are clear. Chronic right fourth and fifth rib fractures with callus. IMPRESSION: No active cardiopulmonary disease. Electronically Signed   By: Ashley Royalty M.D.   On: 05/11/2017 23:01   Ct Head Wo Contrast  Result Date: 05/11/2017 CLINICAL DATA:  72 year old male with altered mental status and headache. EXAM: CT HEAD WITHOUT CONTRAST TECHNIQUE: Contiguous axial images were obtained from the base of the skull through the vertex without intravenous contrast. COMPARISON:  Brain MRI dated 10/29/2016 FINDINGS: Evaluation of this exam is limited due to motion artifact. Brain: There is moderate age-related atrophy and chronic microvascular ischemic changes there is an area of old infarct and encephalomalacia in the left occipital lobe. There is no acute intracranial hemorrhage. No mass effect or midline shift. No extra-axial fluid collection. Vascular: Atherosclerotic calcification of the cavernous and supraclinoid ICA. Skull: Normal. Negative for fracture or focal lesion. Sinuses/Orbits: No acute finding. Other: None IMPRESSION: 1. No acute intracranial hemorrhage. 2. Moderate age-related atrophy and chronic microvascular ischemic changes. Old left occipital infarct and encephalomalacia. Electronically Signed   By: Anner Crete M.D.   On: 05/11/2017 23:28   Mr Brain Wo Contrast  Result Date: 05/12/2017 CLINICAL DATA:  Altered level of consciousness, unexplained. Patient was hypothermic. Symptoms have since returned to baseline. EXAM: MRI HEAD WITHOUT CONTRAST TECHNIQUE: Multiplanar, multiecho pulse sequences of the brain and surrounding structures were obtained without intravenous contrast. COMPARISON:  CT head without contrast 05/11/2017. MRI brain 10/29/2016 FINDINGS: Brain: Remote left PCA territory infarct is again seen. No acute infarct, hemorrhage, or mass lesion is present. A remote infarct of the right  lentiform nucleus is stable. Extensive white matter disease is stable. Ventricles are proportionate to the degree of atrophy with areas of ex vacuo dilation secondary to volume loss. No significant extra-axial fluid collections are present. White matter changes extend into the brainstem. A remote left cerebellar white matter infarct is stable. Vascular: Flow is present in the major intracranial arteries. Skull and upper cervical spine: Skull base is within normal limits. The craniocervical junction is normal. The upper cervical spine is within normal limits. Marrow signal is normal. Sinuses/Orbits: The paranasal sinuses and right mastoid air cells are clear. There is some fluid in left mastoid air cells. No obstructing nasopharyngeal lesion is present. The patient is status post bilateral lens replacements. The globes and orbits are within normal limits. IMPRESSION: 1. No acute intracranial abnormality or significant interval change. 2. Remote infarcts involving the left PCA territory the right lentiform nucleus, and left middle cerebellar peduncle. 3. Diffuse atrophy and white matter disease is moderate to severely advanced for age, stable. This likely reflects the sequela of chronic microvascular ischemia. 4. Left mastoid effusion. No obstructing nasopharyngeal lesion is present. Electronically Signed   By: San Morelle M.D.   On: 05/12/2017 13:17        Scheduled Meds: . aspirin  325 mg Oral Daily  . atorvastatin  20 mg Oral QPM  . carvedilol  6.25 mg Oral BID WC  . clopidogrel  75 mg Oral Daily  . docusate sodium  100 mg Oral BID  . doxycycline  100 mg Oral BID  . heparin  5,000  Units Subcutaneous Q8H  . insulin aspart  0-15 Units Subcutaneous TID WC  . insulin detemir  8 Units Subcutaneous QHS  . isosorbide-hydrALAZINE  1 tablet Oral BID  . lamoTRIgine  100 mg Oral BID  . latanoprost  1 drop Both Eyes QHS  . memantine  10 mg Oral BID  . metoCLOPramide  5 mg Oral TID AC  .  tamsulosin  0.4 mg Oral BID  . timolol  1 drop Both Eyes BID  . venlafaxine XR  75 mg Oral Daily  . vitamin C  500 mg Oral Daily   Continuous Infusions: . sodium chloride      Principal Problem:   Acute encephalopathy Active Problems:   Hypertension   Hypothermia   CKD (chronic kidney disease) stage 3, GFR 30-59 ml/min (HCC)   Non-healing ulcer of lower extremity (HCC)   Seizure disorder ([place])   Uncontrolled type 2 diabetes mellitus with hyperglycemia ([place])    Time spent: 30 minutes     Colbert Ewing, MD, FACP, [facility]. [facility] Pager [phone]  If 7PM-7AM, please contact night-coverage [email or web address] Password [email or web address] 05/12/2017, 1:48 PM    LOS: 0 days

## 2017-05-12 NOTE — ED Notes (Signed)
Pt agrees to go to MRI after he eats breakfast MRI made aware states they will come for patient next.

## 2017-05-12 NOTE — ED Notes (Signed)
Pt a hard stick will call phlebotomy to stick pt.
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
## 2017-05-12 NOTE — ED Notes (Signed)
Patient transported to MR. 

## 2017-05-12 NOTE — ED Notes (Signed)
Bear hugger taken off temp was 98.9 oral

## 2017-05-12 NOTE — ED Notes (Signed)
Pt given 0800 and 1000 meds at the same time per patient request.

## 2017-05-12 NOTE — Progress Notes (Signed)
Pt admitted to the unit. Pt telemetry confirmed with NT. Pt vital signs documented and pt temperature within normal limits. Pt bed weight completed. Pt unable to stand due to BKA and recent foot surgery in the last week. Set pt up for dinner. Pt wife and daughter at bedside. Pt CBG checked in ED, and was within normal limits.

## 2017-05-12 NOTE — ED Notes (Signed)
Heart Healthy/ Carb Modified Diet ordered for Dinner.

## 2017-05-12 NOTE — ED Notes (Signed)
Dr Stark Jock informed of venous blood gas results

## 2017-05-12 NOTE — ED Notes (Signed)
Bear hugger placed on pt due to temp being 93.6 rectal

## 2017-05-13 ENCOUNTER — Encounter (HOSPITAL_COMMUNITY): Payer: Self-pay | Admitting: Nurse Practitioner

## 2017-05-13 ENCOUNTER — Other Ambulatory Visit: Payer: Self-pay

## 2017-05-13 DIAGNOSIS — G40909 Epilepsy, unspecified, not intractable, without status epilepticus: Secondary | ICD-10-CM

## 2017-05-13 DIAGNOSIS — N183 Chronic kidney disease, stage 3 (moderate): Secondary | ICD-10-CM

## 2017-05-13 DIAGNOSIS — G9340 Encephalopathy, unspecified: Secondary | ICD-10-CM | POA: Diagnosis not present

## 2017-05-13 DIAGNOSIS — E1165 Type 2 diabetes mellitus with hyperglycemia: Secondary | ICD-10-CM | POA: Diagnosis not present

## 2017-05-13 DIAGNOSIS — R739 Hyperglycemia, unspecified: Secondary | ICD-10-CM | POA: Diagnosis not present

## 2017-05-13 DIAGNOSIS — L97913 Non-pressure chronic ulcer of unspecified part of right lower leg with necrosis of muscle: Secondary | ICD-10-CM | POA: Diagnosis not present

## 2017-05-13 DIAGNOSIS — I10 Essential (primary) hypertension: Secondary | ICD-10-CM

## 2017-05-13 DIAGNOSIS — R400 Somnolence: Secondary | ICD-10-CM | POA: Diagnosis not present

## 2017-05-13 DIAGNOSIS — T68XXXA Hypothermia, initial encounter: Secondary | ICD-10-CM | POA: Diagnosis not present

## 2017-05-13 LAB — BASIC METABOLIC PANEL
Anion gap: 10 (ref 5–15)
BUN: 84 mg/dL — AB (ref 6–20)
CHLORIDE: 109 mmol/L (ref 101–111)
CO2: 23 mmol/L (ref 22–32)
CREATININE: 3 mg/dL — AB (ref 0.61–1.24)
Calcium: 9.1 mg/dL (ref 8.9–10.3)
GFR calc Af Amer: 23 mL/min — ABNORMAL LOW (ref >60)
GFR calc non Af Amer: 20 mL/min — ABNORMAL LOW (ref >60)
GLUCOSE: 136 mg/dL — AB (ref 65–99)
POTASSIUM: 4.4 mmol/L (ref 3.5–5.1)
SODIUM: 142 mmol/L (ref 135–145)

## 2017-05-13 LAB — GLUCOSE, CAPILLARY
GLUCOSE-CAPILLARY: 104 mg/dL — AB (ref 65–99)
GLUCOSE-CAPILLARY: 163 mg/dL — AB (ref 65–99)
GLUCOSE-CAPILLARY: 131 mg/dL — AB (ref 65–99)
Glucose-Capillary: 225 mg/dL — ABNORMAL HIGH (ref 65–99)

## 2017-05-13 LAB — CBC WITH DIFFERENTIAL/PLATELET
Basophils Absolute: 0 10*3/uL (ref 0.0–0.1)
Basophils Relative: 0 %
EOS ABS: 0.2 10*3/uL (ref 0.0–0.7)
EOS PCT: 2 %
HCT: 32.4 % — ABNORMAL LOW (ref 39.0–52.0)
Hemoglobin: 10.4 g/dL — ABNORMAL LOW (ref 13.0–17.0)
LYMPHS ABS: 1 10*3/uL (ref 0.7–4.0)
LYMPHS PCT: 13 %
MCH: 29.1 pg (ref 26.0–34.0)
MCHC: 32.1 g/dL (ref 30.0–36.0)
MCV: 90.5 fL (ref 78.0–100.0)
MONO ABS: 0.6 10*3/uL (ref 0.1–1.0)
Monocytes Relative: 7 %
Neutro Abs: 6.2 10*3/uL (ref 1.7–7.7)
Neutrophils Relative %: 78 %
Platelets: 239 10*3/uL (ref 150–400)
RBC: 3.58 MIL/uL — ABNORMAL LOW (ref 4.22–5.81)
RDW: 13.4 % (ref 11.5–15.5)
WBC: 8.1 10*3/uL (ref 4.0–10.5)

## 2017-05-13 LAB — URINE CULTURE: CULTURE: NO GROWTH

## 2017-05-13 NOTE — Care Management Obs Status (Signed)
[place] NOTIFICATION   Patient Details  Name: [name] MRN: [id number] Date of Birth: Sep 26, 1945   Medicare Observation Status Notification Given:  Yes    Carles Collet, RN 05/13/2017, 9:14 AM

## 2017-05-13 NOTE — Progress Notes (Signed)
PROGRESS NOTE    [name]  BWG:[id number] DOB: 07/14/1945 DOA: 05/11/2017 PCP: Deland Pretty, MD   Brief Narrative:  HPI On 05/12/2017 by Dr. [name] [name] is a 72 y.o. male with history of chronic combined CHF, chronic kidney disease stage IV, seizure disorder, previous history of stroke with no residual deficits, diabetes mellitus type 2, chronic anemia, recently admitted for chest pain and also had recent surgery for right foot ulcer debridement was brought to the ER the patient by phone that patient was confused.  Last night around 9:00 patient's wife went to the room to sleep and called on to the patient.  Patient was sitting in the day and was appeared confused.  Patient did not lose consciousness but was oriented to his name but was not answering appropriately.  EMS was called and patient was brought to the ER.  Assessment & Plan   Acute encephalopathy, possibly metabolic -Unclear etiology patient currently -Patient was noted to be hypothermic on admission which may have also attributed to patient's confusion. -Thought to be secondary to possible uremia versus seizure versus sepsis -Back to baseline, alert and oriented x3 -continue to monitor output -CT head showed no acute abnormality -MRI head: No acute abnormality.  Remote infarct involving left PCA territory the right lentiform nucleus, and left middle cerebral peduncle -EEG pending  Hypothermia -Unclear etiology, chest x-ray and UA reviewed, unremarkable for infection -Resolved  Chronic RLE infection s/p debridement -Continue wound care and doxycycline -Patient status post surgery with Dr. [name] one week ago  Acute kidney injury on chronic kidney disease, stage IV -Creatinine does appear to be improving -Creatinine on admission 3.54, currently 3 -Baseline creatinine approximately 3 -Monitor intake and output  History of CVA -Continue Plavix, aspirin, statin  History of seizure -Continue  Lamictal -as above, EEG pending   Diabetes mellitus, type II -Continue Levemir, insulin sliding scale and CBG monitoring  Glaucoma -Continue latanoprost  Essential hypertension  -continue bidil, coreg  Dementia -currently AAOx3, continue Namenda  Depression -Continue Effexor  Anemia secondary to chronic kidney disease -Hemoglobin appears stable, continue monitor CBC  Chronic diastolic heart failure -Echocardiogram 10/30/2016 showed an EF of 17-79%, grade 1 diastolic dysfunction -Currently appears to be euvolemic and compensated -Patient did receive IV fluids in the emergency department prior to admission -Creatinine has improved, will likely restart Lasix in the next 24 hours -Monitor intake and output, daily weights  DVT Prophylaxis  heparin  Code Status: Full  Family Communication: None at bedside  Disposition Plan: observation, likely discharge to home in 24 hours  Consultants None  Procedures  None  Antibiotics   Anti-infectives (From admission, onward)   Start     Dose/Rate Route Frequency Ordered Stop   05/12/17 1000  doxycycline (VIBRA-TABS) tablet 100 mg     100 mg Oral 2 times daily 05/12/17 0408        Subjective:   [name] seen and examined today.  Denies current chest pain, shortness of breath, abdominal pain, nausea or vomiting, diarrhea or constipation.  Wonders when he will go home.     Objective:   Vitals:   05/12/17 2015 05/13/17 0051 05/13/17 0457 05/13/17 1222  BP: (!) 169/78 139/75 136/80 117/62  Pulse: 63 68 72 69  Resp: 18 18 16 18   Temp: 98.4 F (36.9 C) 98.4 F (36.9 C) 98.5 F (36.9 C) 98.4 F (36.9 C)  TempSrc: Oral Oral Oral Oral  SpO2: 100% 96% 98% 100%  Weight:  56.4 kg (124 lb 5.4 oz)   Height:        Intake/Output Summary (Last 24 hours) at 05/13/2017 1329 Last data filed at 05/13/2017 0810 Gross per 24 hour  Intake 480 ml  Output 1050 ml  Net -570 ml   Filed Weights   05/11/17 2216 05/12/17 1844 05/13/17  0457  Weight: 58.1 kg (128 lb) 56.2 kg (123 lb 14.4 oz) 56.4 kg (124 lb 5.4 oz)    Exam  General: Well developed, well nourished, NAD, appears stated age  HEENT: NCAT, PERRLA, EOMI, Anicteic Sclera, mucous membranes moist.   Neck: Supple  Cardiovascular: S1 S2 auscultated, no rubs, murmurs or gallops. Regular rate and rhythm.  Respiratory: Clear to auscultation bilaterally with equal chest rise  Abdomen: Soft, nontender, nondistended, + bowel sounds  Extremities: L BKA, right foot with dressing in place  Neuro: AAOx3, nonfocal  Psych: appropriate mood and affect   Data Reviewed: I have personally reviewed following labs and imaging studies  CBC: Recent Labs  Lab 05/06/17 2040 05/06/17 2345 05/12/17 0006 05/12/17 0641 05/13/17 0348  WBC 8.9 8.5 8.7 8.0 8.1  NEUTROABS 7.4  --  7.6  --  6.2  HGB 11.8* 11.2* 10.4* 9.8* 10.4*  HCT 35.9* 34.9* 31.5* 31.3* 32.4*  MCV 89.5 89.5 89.2 90.5 90.5  PLT 230 203 228 211 073   Basic Metabolic Panel: Recent Labs  Lab 05/06/17 1931 05/06/17 2345 05/12/17 0006 05/12/17 0641 05/12/17 1941 05/13/17 0348  NA 137  --  139 143 144 142  K 4.8  --  5.1 4.5 4.7 4.4  CL 107  --  109 111 112* 109  CO2 18*  --  18* 19* 22 23  GLUCOSE 438*  --  423* 352* 126* 136*  BUN 77*  --  108* 104* 88* 84*  CREATININE 3.04* 2.90* 3.54* 3.46* 3.04* 3.00*  CALCIUM 9.3  --  8.9 9.0 9.4 9.1   GFR: Estimated Creatinine Clearance: 18.3 mL/min (A) (by C-G formula based on SCr of 3 mg/dL (H)). Liver Function Tests: Recent Labs  Lab 05/06/17 1931 05/12/17 0006  AST 22 18  ALT 15* 15*  ALKPHOS 96 94  BILITOT 0.9 0.8  PROT 6.7 5.8*  ALBUMIN 3.3* 2.8*   Recent Labs  Lab 05/12/17 0006  LIPASE 23   Recent Labs  Lab 05/12/17 0006 05/12/17 0408  AMMONIA 15 20   Coagulation Profile: No results for input(s): INR, PROTIME in the last 168 hours. Cardiac Enzymes: Recent Labs  Lab 05/06/17 2345 05/07/17 0246 05/07/17 0514  TROPONINI  <0.03 <0.03 <0.03   BNP (last 3 results) No results for input(s): PROBNP in the last 8760 hours. HbA1C: No results for input(s): HGBA1C in the last 72 hours. CBG: Recent Labs  Lab 05/12/17 0736 05/12/17 1822 05/12/17 2112 05/13/17 0750 05/13/17 1153  GLUCAP 328* 89 155* 104* 225*   Lipid Profile: No results for input(s): CHOL, HDL, LDLCALC, TRIG, CHOLHDL, LDLDIRECT in the last 72 hours. Thyroid Function Tests: Recent Labs    05/12/17 0233  TSH 0.732   Anemia Panel: No results for input(s): VITAMINB12, FOLATE, FERRITIN, TIBC, IRON, RETICCTPCT in the last 72 hours. Urine analysis:    Component Value Date/Time   COLORURINE YELLOW 05/12/2017 0354   APPEARANCEUR CLEAR 05/12/2017 0354   LABSPEC 1.009 05/12/2017 0354   PHURINE 5.0 05/12/2017 0354   GLUCOSEU >=500 (A) 05/12/2017 0354   HGBUR NEGATIVE 05/12/2017 0354   BILIRUBINUR NEGATIVE 05/12/2017 0354   KETONESUR NEGATIVE 05/12/2017 0354  PROTEINUR 30 (A) 05/12/2017 0354   UROBILINOGEN 0.2 12/07/2012 1140   NITRITE NEGATIVE 05/12/2017 0354   LEUKOCYTESUR NEGATIVE 05/12/2017 0354   Sepsis Labs: @LABRCNTIP (procalcitonin:4,lacticidven:4)  ) Recent Results (from the past 240 hour(s))  Urine culture     Status: None   Collection Time: 05/12/17  4:00 AM  Result Value Ref Range Status   Specimen Description URINE, CLEAN CATCH  Final   Special Requests NONE  Final   Culture   Final    NO GROWTH Performed at [facility], [place] [address], [place], [place] [address]    Report Status 05/13/2017 FINAL  Final      Radiology Studies: Dg Chest 2 View  Result Date: 05/11/2017 CLINICAL DATA:  Altered mental status and hyperglycemia EXAM: CHEST  2 VIEW COMPARISON:  05/06/2017 FINDINGS: The heart size and mediastinal contours are within normal limits. Minimal aortic atherosclerosis without aneurysm. Both lungs are clear. Chronic right fourth and fifth rib fractures with callus. IMPRESSION: No active cardiopulmonary  disease. Electronically Signed   By: Ashley Royalty M.D.   On: 05/11/2017 23:01   Ct Head Wo Contrast  Result Date: 05/11/2017 CLINICAL DATA:  72 year old male with altered mental status and headache. EXAM: CT HEAD WITHOUT CONTRAST TECHNIQUE: Contiguous axial images were obtained from the base of the skull through the vertex without intravenous contrast. COMPARISON:  Brain MRI dated 10/29/2016 FINDINGS: Evaluation of this exam is limited due to motion artifact. Brain: There is moderate age-related atrophy and chronic microvascular ischemic changes there is an area of old infarct and encephalomalacia in the left occipital lobe. There is no acute intracranial hemorrhage. No mass effect or midline shift. No extra-axial fluid collection. Vascular: Atherosclerotic calcification of the cavernous and supraclinoid ICA. Skull: Normal. Negative for fracture or focal lesion. Sinuses/Orbits: No acute finding. Other: None IMPRESSION: 1. No acute intracranial hemorrhage. 2. Moderate age-related atrophy and chronic microvascular ischemic changes. Old left occipital infarct and encephalomalacia. Electronically Signed   By: Anner Crete M.D.   On: 05/11/2017 23:28   Mr Brain Wo Contrast  Result Date: 05/12/2017 CLINICAL DATA:  Altered level of consciousness, unexplained. Patient was hypothermic. Symptoms have since returned to baseline. EXAM: MRI HEAD WITHOUT CONTRAST TECHNIQUE: Multiplanar, multiecho pulse sequences of the brain and surrounding structures were obtained without intravenous contrast. COMPARISON:  CT head without contrast 05/11/2017. MRI brain 10/29/2016 FINDINGS: Brain: Remote left PCA territory infarct is again seen. No acute infarct, hemorrhage, or mass lesion is present. A remote infarct of the right lentiform nucleus is stable. Extensive white matter disease is stable. Ventricles are proportionate to the degree of atrophy with areas of ex vacuo dilation secondary to volume loss. No significant  extra-axial fluid collections are present. White matter changes extend into the brainstem. A remote left cerebellar white matter infarct is stable. Vascular: Flow is present in the major intracranial arteries. Skull and upper cervical spine: Skull base is within normal limits. The craniocervical junction is normal. The upper cervical spine is within normal limits. Marrow signal is normal. Sinuses/Orbits: The paranasal sinuses and right mastoid air cells are clear. There is some fluid in left mastoid air cells. No obstructing nasopharyngeal lesion is present. The patient is status post bilateral lens replacements. The globes and orbits are within normal limits. IMPRESSION: 1. No acute intracranial abnormality or significant interval change. 2. Remote infarcts involving the left PCA territory the right lentiform nucleus, and left middle cerebellar peduncle. 3. Diffuse atrophy and white matter disease is moderate to  severely advanced for age, stable. This likely reflects the sequela of chronic microvascular ischemia. 4. Left mastoid effusion. No obstructing nasopharyngeal lesion is present. Electronically Signed   By: San Morelle M.D.   On: 05/12/2017 13:17     Scheduled Meds: . aspirin  325 mg Oral Daily  . atorvastatin  20 mg Oral QPM  . carvedilol  6.25 mg Oral BID WC  . clopidogrel  75 mg Oral Daily  . docusate sodium  100 mg Oral BID  . doxycycline  100 mg Oral BID  . heparin injection (subcutaneous)  5,000 Units Subcutaneous Q8H  . insulin aspart  0-15 Units Subcutaneous TID WC  . insulin detemir  6 Units Subcutaneous QHS  . isosorbide-hydrALAZINE  1 tablet Oral BID  . lamoTRIgine  100 mg Oral BID  . latanoprost  1 drop Both Eyes QHS  . memantine  10 mg Oral BID  . metoCLOPramide  5 mg Oral TID AC  . tamsulosin  0.4 mg Oral BID  . timolol  1 drop Both Eyes BID  . venlafaxine XR  75 mg Oral Daily  . vitamin C  500 mg Oral Daily   Continuous Infusions: . sodium chloride        LOS: 0 days   Time Spent in minutes   30 minutes  Janaiyah Blackard D.O. on 05/13/2017 at 1:29 PM  Between 7am to 7pm - Pager - [phone]  After 7pm go to [email or web address] - password [email or web address]  And look for the night coverage person covering for me after hours  [facility] Office  [phone]

## 2017-05-14 ENCOUNTER — Ambulatory Visit: Payer: Medicare Other | Admitting: Cardiovascular Disease

## 2017-05-14 ENCOUNTER — Ambulatory Visit (INDEPENDENT_AMBULATORY_CARE_PROVIDER_SITE_OTHER): Payer: Medicare Other | Admitting: Orthopedic Surgery

## 2017-05-14 ENCOUNTER — Observation Stay (HOSPITAL_COMMUNITY): Payer: Medicare Other

## 2017-05-14 DIAGNOSIS — I10 Essential (primary) hypertension: Secondary | ICD-10-CM | POA: Diagnosis not present

## 2017-05-14 DIAGNOSIS — T68XXXA Hypothermia, initial encounter: Secondary | ICD-10-CM | POA: Diagnosis not present

## 2017-05-14 DIAGNOSIS — G9340 Encephalopathy, unspecified: Secondary | ICD-10-CM | POA: Diagnosis not present

## 2017-05-14 DIAGNOSIS — R400 Somnolence: Secondary | ICD-10-CM

## 2017-05-14 DIAGNOSIS — R739 Hyperglycemia, unspecified: Secondary | ICD-10-CM | POA: Diagnosis not present

## 2017-05-14 DIAGNOSIS — N183 Chronic kidney disease, stage 3 (moderate): Secondary | ICD-10-CM | POA: Diagnosis not present

## 2017-05-14 LAB — CBC WITH DIFFERENTIAL/PLATELET
Basophils Absolute: 0 10*3/uL (ref 0.0–0.1)
Basophils Relative: 0 %
EOS ABS: 0.2 10*3/uL (ref 0.0–0.7)
Eosinophils Relative: 2 %
HCT: 35.1 % — ABNORMAL LOW (ref 39.0–52.0)
Hemoglobin: 11.3 g/dL — ABNORMAL LOW (ref 13.0–17.0)
LYMPHS ABS: 1 10*3/uL (ref 0.7–4.0)
Lymphocytes Relative: 13 %
MCH: 28.8 pg (ref 26.0–34.0)
MCHC: 32.2 g/dL (ref 30.0–36.0)
MCV: 89.5 fL (ref 78.0–100.0)
MONOS PCT: 10 %
Monocytes Absolute: 0.8 10*3/uL (ref 0.1–1.0)
Neutro Abs: 6 10*3/uL (ref 1.7–7.7)
Neutrophils Relative %: 75 %
PLATELETS: 236 10*3/uL (ref 150–400)
RBC: 3.92 MIL/uL — AB (ref 4.22–5.81)
RDW: 13.4 % (ref 11.5–15.5)
WBC: 8 10*3/uL (ref 4.0–10.5)

## 2017-05-14 LAB — BASIC METABOLIC PANEL
Anion gap: 10 (ref 5–15)
BUN: 73 mg/dL — ABNORMAL HIGH (ref 6–20)
CHLORIDE: 108 mmol/L (ref 101–111)
CO2: 21 mmol/L — ABNORMAL LOW (ref 22–32)
CREATININE: 2.75 mg/dL — AB (ref 0.61–1.24)
Calcium: 9.4 mg/dL (ref 8.9–10.3)
GFR calc Af Amer: 25 mL/min — ABNORMAL LOW (ref >60)
GFR calc non Af Amer: 22 mL/min — ABNORMAL LOW (ref >60)
Glucose, Bld: 98 mg/dL (ref 65–99)
Potassium: 4.7 mmol/L (ref 3.5–5.1)
SODIUM: 139 mmol/L (ref 135–145)

## 2017-05-14 LAB — GLUCOSE, CAPILLARY
GLUCOSE-CAPILLARY: 95 mg/dL (ref 65–99)
GLUCOSE-CAPILLARY: 196 mg/dL — AB (ref 65–99)

## 2017-05-14 NOTE — Progress Notes (Signed)
CM talked to patient with spouse at the bedside; she plans to take patient home in private vehicle and they do not want ambulance transportation at this time; Aneta Mins [phone]

## 2017-05-14 NOTE — Discharge Instructions (Signed)

## 2017-05-14 NOTE — Progress Notes (Signed)
Pt got discharged to home, discharge instructions provided and patient showed understanding to it, IV taken out,Telemonitor DC,pt left unit in wheelchair with all of the belongings accompanied with a family member (wife)  [name],RN 

## 2017-05-14 NOTE — Progress Notes (Signed)
EEG complete - results pending 

## 2017-05-14 NOTE — Procedures (Signed)
ELECTROENCEPHALOGRAM REPORT  Date of Study: 05/14/2017  Patient's Name: [name] MRN: [id number] Date of Birth: 1945-10-15  Referring Provider: [name], MD  Clinical History: 72 y.o. male with history of chronic combined CHF, chronic kidney disease stage IV, seizure disorder, previous history of stroke presents for confusion.  Medications: Lamictal Namenda Venlafaxine XR Zofran Reglan Bidil Levemir ASA Plavix Coreg Lipitor  Technical Summary: A multichannel digital EEG recording measured by the international 10-20 system with electrodes applied with paste and impedances below 5000 ohms performed as portable with EKG monitoring in an awake and drowsy patient.  Hyperventilation was not performed.  Photic stimulation was performed.  The digital EEG was referentially recorded, reformatted, and digitally filtered in a variety of bipolar and referential montages for optimal display.   Description: The patient is awake and drowsy during the recording.  During maximal wakefulness, there is a symmetric, medium voltage 8 Hz posterior dominant rhythm that attenuates with eye opening. This is admixed with a moderate amount of diffuse 4-5 Hz theta and mild 2-3 Hz delta slowing of the waking background.  During drowsiness, there is increased theta activity.  Stage 2 sleep is not seen.  Photic stimulation did not elicit any abnormalities.  There were no epileptiform discharges or electrographic seizures seen.    EKG lead was unremarkable.  Impression: This awake and drowsy EEG is abnormal due to diffuse slowing of the waking background.  Clinical Correlation of the above findings indicates diffuse cerebral dysfunction that is non-specific in etiology and can be seen with hypoxic/ischemic injury, toxic/metabolic encephalopathies, neurodegenerative disorders, or medication effect.  The absence of epileptiform discharges does not rule out a clinical diagnosis of epilepsy.  Clinical  correlation is advised.   Metta Clines, DO

## 2017-05-14 NOTE — Care Management Note (Signed)
Case Management Note  Patient Details  Name: KENDALE REMBOLD MRN: [id number] Date of Birth: 1945/06/03  Subjective/Objective:     Acute encephalopathy              Action/Plan: Patient lives at home with spouse;PCP: Deland Pretty, MD; has private insurance with [facility] with prescription drug coverage; pharmacy of choice is [facility]; DME - cane,walker and wheelchair at home; [facility] choice offered, pt chose [facility]; referral placed as requested.   Expected Discharge Date:    05/14/2017              Expected Discharge Plan:  [place]  Discharge planning Services  CM Consult  Choice offered to:  Patient  HH Arranged:  RN, PT, OT, Nurse's Aide [place] Agency:  [facility] (formerly [facility])  Status of Service:  In process, will continue to follow  Sherrilyn Rist [phone] 05/14/2017, 11:16 AM

## 2017-05-14 NOTE — Consult Note (Signed)
[place] Nurse wound consult note Reason for Consult: Consult requested for right foot.  Pt is followed by Dr [name] as an outpatient and had surgery on 2/15 for toe amputation.  He was due to go to a Dr apt today for assessment, which he will miss since he is in the hospital. Wound type: Full thickness healing post-op incision to right outer foot; sutures are dry and intact and well-approximated without open wound, odor, drainage, or fluctuance. Dressing procedure/placement/frequency: Dry dressing, kerlex, and ace wrap applied.  Pt can resume follow-up with Dr [name] after discharge.  He is wearing a blue post-op shoe. Please re-consult if further assistance is needed.  Thank-you,  Julien Girt MSN, [place], [place], [place], [place]

## 2017-05-14 NOTE — Discharge Summary (Signed)
Physician Discharge Summary  [name] ALP:[id number] DOB: 1946-03-17 DOA: 05/11/2017  PCP: Deland Pretty, MD  Admit date: 05/11/2017 Discharge date: 05/14/2017  Time spent: 45 minutes  Recommendations for Outpatient Follow-up:  Patient will be discharged to home with home health services: physical and occupational therapy, nursing and aid. Patient will need to follow up with primary care provider within one week of discharge, discuss diabetes management.  Patient should continue medications as prescribed.  Patient should follow a carb modified diet.   Discharge Diagnoses:  Acute encephalopathy, possibly metabolic Hypothermia Chronic RLE infection s/p debridement Acute kidney injury on chronic kidney disease, stage IV History of CVA History of seizure Diabetes mellitus, type II Glaucoma Essential hypertension  Dementia Depression Anemia secondary to chronic kidney disease Chronic diastolic heart failure  Discharge Condition: Stable  Diet recommendation: carb modified  Filed Weights   05/12/17 1844 05/13/17 0457 05/14/17 0459  Weight: 56.2 kg (123 lb 14.4 oz) 56.4 kg (124 lb 5.4 oz) 55.4 kg (122 lb 2.2 oz)    History of present illness:  On 05/12/2017 by Dr. Elsie Stain a 72 y.o.malewithhistory of chronic combined CHF, chronic kidney disease stage IV, seizure disorder, previous history of stroke with no residual deficits, diabetes mellitus type 2, chronic anemia, recently admitted for chest pain and also had recent surgery for right foot ulcer debridement was brought to the ER the patient by phone that patient was confused. Last night around 9:00 patient's wife went to the room to sleep and called on to the patient. Patient was sitting in the day and was appeared confused. Patient did not lose consciousness but was oriented to his name but was not answering appropriately. EMS was called and patient was brought to the ER.  Hospital Course:  Acute  encephalopathy, possibly metabolic -Unclear etiology  -Patient was noted to be hypothermic on admission which may have also attributed to patient's confusion. -Thought to be secondary to possible uremia versus seizure versus sepsis -Back to baseline, alert and oriented x3 -continue to monitor output -CT head showed no acute abnormality -MRI head: No acute abnormality.  Remote infarct involving left PCA territory the right lentiform nucleus, and left middle cerebral peduncle -EEG: No elective form discharges or electrographic seizures seen.  Awake and drowsy EEG abnormal due to diffuse slowing of the waking background. -Had long discussion with patient's wife via phone, given that patient has had history of CVA, dementia, and seizure disorder, periods of altered mental status can recur.  Hypothermia -Unclear etiology, chest x-ray and UA reviewed, unremarkable for infection -Resolved  Chronic RLE infection s/p debridement -Continue wound care and doxycycline -Patient status post surgery with Dr. [name] one week ago  Acute kidney injury on chronic kidney disease, stage IV -Creatinine does appear to be improving -Creatinine on admission 3.54, currently 2.75 -Baseline creatinine approximately 3 -Monitor intake and output  History of CVA -Continue Plavix, aspirin, statin  History of seizure -Continue Lamictal -as above, EEG as above  Diabetes mellitus, type II -Continue Levemir, insulin sliding scale and CBG monitoring  Glaucoma -Continue latanoprost  Essential hypertension  -continue bidil, coreg  Dementia -currently AAOx3, continue Namenda  Depression -Continue Effexor  Anemia secondary to chronic kidney disease -Hemoglobin appears stable, currently 11.3   Chronic diastolic heart failure -Echocardiogram 10/30/2016 showed an EF of 35-32%, grade 1 diastolic dysfunction -Currently appears to be euvolemic and compensated -Patient did receive IV fluids in the  emergency department prior to admission -Creatinine has improved, patient may  restart Lasix upon discharge -Monitor intake and output, daily weights  Procedures: EEG  Consultations: None  Discharge Exam: Vitals:   05/14/17 0459 05/14/17 0800  BP: 120/63 139/74  Pulse: 78   Resp: 16   Temp: 97.8 F (36.6 C)   SpO2: 100%    Patient seen and examined today.  Denies any current chest pain, shortness of breath, abdominal pain, nausea vomiting, diarrhea constipation, dizziness or headache.  States he is ready to go home.   General: Well developed, thin, elderly, NAD, appears stated age  72: NCAT, mucous membranes moist.  Cardiovascular: S1 S2 auscultated, RRR, no murmur  Respiratory: Clear to auscultation bilaterally with equal chest rise  Abdomen: Soft, nontender, nondistended, + bowel sounds  Extremities: Left BKA, right foot with dressing in place  Neuro: AAOx3, nonfocal  Psych: appropriate mood and affect  Discharge Instructions Discharge Instructions    Discharge instructions   Complete by:  As directed    Patient will be discharged to home.  Patient will need to follow up with primary care provider within one week of discharge, discuss diabetes management.  Patient should continue medications as prescribed.  Patient should follow a carb modified diet.     Allergies as of 05/14/2017      Reactions   Codeine Anaphylaxis   Penicillins Anaphylaxis, Other (See Comments)   PATIENT HAD A PCN REACTION WITH IMMEDIATE RASH, FACIAL/TONGUE/THROAT SWELLING, SOB, OR LIGHTHEADEDNESS WITH HYPOTENSION:  #  #  #  YES  #  #  #  Has patient had a PCN reaction causing severe rash involving mucus membranes or skin necrosis: No Has patient had a PCN reaction that required hospitalization: Unknown Has patient had a PCN reaction occurring within the last 10 years: No If all of the above answers are "NO", then may proceed with Cephalosporin use.      Medication List    TAKE these  medications   acetaminophen 500 MG tablet Commonly known as:  TYLENOL Take 500 mg by mouth 2 (two) times daily as needed for moderate pain or headache.   ASPERCREME LIDOCAINE EX Apply 1 application topically daily as needed (for pain).   aspirin 325 MG tablet Take 325 mg by mouth daily.   atorvastatin 20 MG tablet Commonly known as:  LIPITOR Take 20 mg by mouth every evening.   carvedilol 6.25 MG tablet Commonly known as:  COREG Take 1 tablet (6.25 mg total) by mouth 2 (two) times daily with a meal.   CENTRUM SILVER ADULT 50+ PO Take 1 tablet by mouth daily.   clopidogrel 75 MG tablet Commonly known as:  PLAVIX Take 1 tablet (75 mg total) by mouth daily.   docusate sodium 100 MG capsule Commonly known as:  COLACE Take 1 capsule (100 mg total) by mouth 2 (two) times daily.   doxycycline 100 MG tablet Commonly known as:  VIBRA-TABS Take 1 tablet (100 mg total) by mouth 2 (two) times daily.   furosemide 40 MG tablet Commonly known as:  LASIX Take 1 tablet (40 mg total) by mouth daily.   gabapentin 100 MG capsule Commonly known as:  NEURONTIN Take 400 mg by mouth at bedtime.   insulin detemir 100 UNIT/ML injection Commonly known as:  LEVEMIR Inject 0.05 mLs (5 Units total) into the skin at bedtime. What changed:  how much to take   insulin lispro 100 UNIT/ML injection Commonly known as:  HUMALOG Inject 2 Units into the skin 3 (three) times daily before meals.  isosorbide-hydrALAZINE 20-37.5 MG tablet Commonly known as:  BIDIL Take 1 tablet by mouth 2 (two) times daily.   lamoTRIgine 100 MG tablet Commonly known as:  LAMICTAL Take 1 tablet (100 mg total) by mouth 2 (two) times daily.   memantine 10 MG tablet Commonly known as:  NAMENDA Take 1 tablet (10 mg total) by mouth 2 (two) times daily.   metoCLOPramide 5 MG/5ML solution Commonly known as:  REGLAN Take 5 mLs (5 mg total) by mouth 3 (three) times daily before meals.   polyethylene glycol  packet Commonly known as:  MIRALAX / GLYCOLAX Take 17 g by mouth daily as needed for mild constipation.   silver sulfADIAZINE 1 % cream Commonly known as:  SILVADENE Apply 1 application topically daily as needed (sores).   tamsulosin 0.4 MG Caps capsule Commonly known as:  FLOMAX Take 1 capsule (0.4 mg total) by mouth daily after supper. What changed:  when to take this   timolol 0.5 % ophthalmic solution Commonly known as:  BETIMOL Place 1 drop into both eyes 2 (two) times daily.   Travoprost (BAK Free) 0.004 % Soln ophthalmic solution Commonly known as:  [name] Place 1 drop into both eyes at bedtime.   venlafaxine XR 75 MG 24 hr capsule Commonly known as:  EFFEXOR-XR Take 75 mg by mouth daily.   vitamin C 500 MG tablet Commonly known as:  ASCORBIC ACID Take 500 mg by mouth daily.      Allergies  Allergen Reactions  . Codeine Anaphylaxis  . Penicillins Anaphylaxis and Other (See Comments)    PATIENT HAD A PCN REACTION WITH IMMEDIATE RASH, FACIAL/TONGUE/THROAT SWELLING, SOB, OR LIGHTHEADEDNESS WITH HYPOTENSION:  #  #  #  YES  #  #  #  Has patient had a PCN reaction causing severe rash involving mucus membranes or skin necrosis: No Has patient had a PCN reaction that required hospitalization: Unknown Has patient had a PCN reaction occurring within the last 10 years: No If all of the above answers are "NO", then may proceed with Cephalosporin use.    Follow-up Information    Deland Pretty, MD. Schedule an appointment as soon as possible for a visit in 1 week(s).   Specialty:  Internal Medicine Why:  Hospital folow up Contact information: [address] [place] [address] [phone]        Josue Hector, MD Follow up.   Specialty:  Cardiology Contact information: [address] [address] [phone]        [facility] Follow up.   Specialty:  [place] Why:  They will do your home  health care at your home Contact information: [place] [address] [phone]            The results of significant diagnostics from this hospitalization (including imaging, microbiology, ancillary and laboratory) are listed below for reference.    Significant Diagnostic Studies: Dg Chest 2 View  Result Date: 05/11/2017 CLINICAL DATA:  Altered mental status and hyperglycemia EXAM: CHEST  2 VIEW COMPARISON:  05/06/2017 FINDINGS: The heart size and mediastinal contours are within normal limits. Minimal aortic atherosclerosis without aneurysm. Both lungs are clear. Chronic right fourth and fifth rib fractures with callus. IMPRESSION: No active cardiopulmonary disease. Electronically Signed   By: Ashley Royalty M.D.   On: 05/11/2017 23:01   Dg Chest 2 View  Result Date: 05/06/2017 CLINICAL DATA:  New onset weakness. EXAM: CHEST  2 VIEW COMPARISON:  07/02/2015 FINDINGS: The cardiomediastinal silhouette is within normal limits. The lungs are well inflated and clear. No pleural effusion or pneumothorax is identified. Old right rib fractures are noted. IMPRESSION: No active cardiopulmonary disease. Electronically Signed   By: Logan Bores M.D.   On: 05/06/2017 20:14   Ct Head Wo Contrast  Result Date: 05/11/2017 CLINICAL DATA:  72 year old male with altered mental status and headache. EXAM: CT HEAD WITHOUT CONTRAST TECHNIQUE: Contiguous axial images were obtained from the base of the skull through the vertex without intravenous contrast. COMPARISON:  Brain MRI dated 10/29/2016 FINDINGS: Evaluation of this exam is limited due to motion artifact. Brain: There is moderate age-related atrophy and chronic microvascular ischemic changes there is an area of old infarct and encephalomalacia in the left occipital lobe. There is no acute intracranial hemorrhage. No mass effect or midline shift. No extra-axial fluid collection. Vascular: Atherosclerotic calcification of the cavernous  and supraclinoid ICA. Skull: Normal. Negative for fracture or focal lesion. Sinuses/Orbits: No acute finding. Other: None IMPRESSION: 1. No acute intracranial hemorrhage. 2. Moderate age-related atrophy and chronic microvascular ischemic changes. Old left occipital infarct and encephalomalacia. Electronically Signed   By: Anner Crete M.D.   On: 05/11/2017 23:28   Mr Brain Wo Contrast  Result Date: 05/12/2017 CLINICAL DATA:  Altered level of consciousness, unexplained. Patient was hypothermic. Symptoms have since returned to baseline. EXAM: MRI HEAD WITHOUT CONTRAST TECHNIQUE: Multiplanar, multiecho pulse sequences of the brain and surrounding structures were obtained without intravenous contrast. COMPARISON:  CT head without contrast 05/11/2017. MRI brain 10/29/2016 FINDINGS: Brain: Remote left PCA territory infarct is again seen. No acute infarct, hemorrhage, or mass lesion is present. A remote infarct of the right lentiform nucleus is stable. Extensive white matter disease is stable. Ventricles are proportionate to the degree of atrophy with areas of ex vacuo dilation secondary to volume loss. No significant extra-axial fluid collections are present. White matter changes extend into the brainstem. A remote left cerebellar white matter infarct is stable. Vascular: Flow is present in the major intracranial arteries. Skull and upper cervical spine: Skull base is within normal limits. The craniocervical junction is normal. The upper cervical spine is within normal limits. Marrow signal is normal. Sinuses/Orbits: The paranasal sinuses and right mastoid air cells are clear. There is some fluid in left mastoid air cells. No obstructing nasopharyngeal lesion is present. The patient is status post bilateral lens replacements. The globes and orbits are within normal limits. IMPRESSION: 1. No acute intracranial abnormality or significant interval change. 2. Remote infarcts involving the left PCA territory the right  lentiform nucleus, and left middle cerebellar peduncle. 3. Diffuse atrophy and white matter disease is moderate to severely advanced for age, stable. This likely reflects the sequela of chronic microvascular ischemia. 4. Left mastoid effusion. No obstructing nasopharyngeal lesion is present. Electronically Signed   By: San Morelle M.D.   On: 05/12/2017 13:17   Mr Foot Right W/o Contrast  Result Date: 04/28/2017 CLINICAL DATA:  Diabetic foot ulcer in the medial plantar foot near the arch. Evaluate for osteomyelitis. EXAM: MRI OF THE RIGHT FOREFOOT WITHOUT CONTRAST TECHNIQUE: Multiplanar, multisequence MR imaging of the right forefoot was performed. No intravenous contrast was administered. COMPARISON:  Right foot x-rays dated March 14, 2013. FINDINGS: Bones/Joint/Cartilage Prior third through fifth transmetatarsal amputations. Prior amputation of the second toe. No suspicious marrow signal abnormality to suggest osteomyelitis. Degenerative marrow edema and cystic change in the first metatarsal head, first proximal phalanx, and  calcaneocuboid and naviculocuneiform joints. Prior ankylosis of the first and second tarsometatarsal joints. Moderate hallux valgus deformity. No fracture or dislocation. No joint effusion. Cartilage loss and subchondral marrow edema in the posterior tibiotalar joint. Small amount of marrow edema in the lateral talar process. Pes planus. Ligaments First digit collateral ligaments are intact. Muscles and Tendons No tenosynovitis. Diffuse fatty atrophy of the intrinsic muscles of the forefoot. Soft tissue Soft tissue ulceration along the medial plantar foot near the navicular. Mild diffuse soft tissue edema about the forefoot. No fluid collection. No soft tissue mass. IMPRESSION: 1. Soft tissue ulceration along the medial plantar foot near the navicular bone, without evidence of osteomyelitis. No drainable fluid collection. 2. Multiple prior amputations. Neuropathic arthropathy of  the midfoot. Electronically Signed   By: Titus Dubin M.D.   On: 04/28/2017 08:21   Dg Abd Portable 1v  Result Date: 05/07/2017 CLINICAL DATA:  Ileus.  Periumbilical pain. EXAM: PORTABLE ABDOMEN - 1 VIEW COMPARISON:  None. FINDINGS: A large amount of fecal retention is seen within the colon. No small bowel dilatation is noted. There is lower lumbar facet arthropathy from L4 through S1. 3 cannulated screws traverse the left femoral neck. No free air, organomegaly or radiopaque calculi. IMPRESSION: Increased colonic stool burden consistent with constipation. Lower lumbar facet arthropathy. Three cannulated screws traverse a remote subcapital left femoral neck fracture. Electronically Signed   By: Ashley Royalty M.D.   On: 05/07/2017 21:40   Xr Foot 2 Views Right  Result Date: 04/23/2017 Radiographs of right foot show chronic charcot collapse with silver nitrate probing to the navicular. Ray amputations of the 3, 4 and 5th toes.    Microbiology: Recent Results (from the past 240 hour(s))  Culture, blood (routine x 2)     Status: None (Preliminary result)   Collection Time: 05/12/17  2:40 AM  Result Value Ref Range Status   Specimen Description BLOOD LEFT HAND  Final   Special Requests IN PEDIATRIC BOTTLE Blood Culture adequate volume  Final   Culture   Final    NO GROWTH 1 DAY Performed at [facility], [address] [place], [place] [address]    Report Status PENDING  Incomplete  Culture, blood (routine x 2)     Status: None (Preliminary result)   Collection Time: 05/12/17  2:51 AM  Result Value Ref Range Status   Specimen Description BLOOD BLOOD LEFT WRIST  Final   Special Requests AEROBIC BOTTLE ONLY Blood Culture adequate volume  Final   Culture   Final    NO GROWTH 1 DAY Performed at [facility], [place] [address] [place], [place] [address]    Report Status PENDING  Incomplete  Urine culture     Status: None   Collection Time: 05/12/17  4:00 AM  Result Value Ref Range  Status   Specimen Description URINE, CLEAN CATCH  Final   Special Requests NONE  Final   Culture   Final    NO GROWTH Performed at [facility], [address], [place],  [address]    Report Status 05/13/2017 FINAL  Final     Labs: Basic Metabolic Panel: Recent Labs  Lab 05/12/17 0006 05/12/17 0641 05/12/17 1941 05/13/17 0348 05/14/17 0644  NA 139 143 144 142 139  K 5.1 4.5 4.7 4.4 4.7  CL 109 111 112* 109 108  CO2 18* 19* 22 23 21*  GLUCOSE 423* 352* 126* 136* 98  BUN 108* 104* 88* 84*  73*  CREATININE 3.54* 3.46* 3.04* 3.00* 2.75*  CALCIUM 8.9 9.0 9.4 9.1 9.4   Liver Function Tests: Recent Labs  Lab 05/12/17 0006  AST 18  ALT 15*  ALKPHOS 94  BILITOT 0.8  PROT 5.8*  ALBUMIN 2.8*   Recent Labs  Lab 05/12/17 0006  LIPASE 23   Recent Labs  Lab 05/12/17 0006 05/12/17 0408  AMMONIA 15 20   CBC: Recent Labs  Lab 05/12/17 0006 05/12/17 0641 05/13/17 0348 05/14/17 0644  WBC 8.7 8.0 8.1 8.0  NEUTROABS 7.6  --  6.2 6.0  HGB 10.4* 9.8* 10.4* 11.3*  HCT 31.5* 31.3* 32.4* 35.1*  MCV 89.2 90.5 90.5 89.5  PLT 228 211 239 236   Cardiac Enzymes: No results for input(s): CKTOTAL, CKMB, CKMBINDEX, TROPONINI in the last 168 hours. BNP: BNP (last 3 results) Recent Labs    05/06/17 2042  BNP 12.7    ProBNP (last 3 results) No results for input(s): PROBNP in the last 8760 hours.  CBG: Recent Labs  Lab 05/13/17 0750 05/13/17 1153 05/13/17 1637 05/13/17 2111 05/14/17 0732  GLUCAP 104* 225* 163* 131* 95       Signed:  Tongela Encinas  [facility] 05/14/2017, 11:27 AM

## 2017-05-15 ENCOUNTER — Ambulatory Visit (INDEPENDENT_AMBULATORY_CARE_PROVIDER_SITE_OTHER): Payer: Medicare Other | Admitting: Orthopedic Surgery

## 2017-05-15 ENCOUNTER — Encounter (INDEPENDENT_AMBULATORY_CARE_PROVIDER_SITE_OTHER): Payer: Self-pay | Admitting: Orthopedic Surgery

## 2017-05-15 DIAGNOSIS — N184 Chronic kidney disease, stage 4 (severe): Secondary | ICD-10-CM | POA: Diagnosis not present

## 2017-05-15 DIAGNOSIS — E1122 Type 2 diabetes mellitus with diabetic chronic kidney disease: Secondary | ICD-10-CM | POA: Diagnosis not present

## 2017-05-15 DIAGNOSIS — Z87898 Personal history of other specified conditions: Secondary | ICD-10-CM | POA: Diagnosis not present

## 2017-05-15 DIAGNOSIS — M14671 Charcot's joint, right ankle and foot: Secondary | ICD-10-CM

## 2017-05-15 DIAGNOSIS — Z8673 Personal history of transient ischemic attack (TIA), and cerebral infarction without residual deficits: Secondary | ICD-10-CM | POA: Diagnosis not present
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
## 2017-05-17 LAB — CULTURE, BLOOD (ROUTINE X 2)
Culture: NO GROWTH
Culture: NO GROWTH
SPECIAL REQUESTS: ADEQUATE
Special Requests: ADEQUATE

## 2017-05-18 ENCOUNTER — Encounter: Payer: Self-pay | Admitting: Cardiovascular Disease

## 2017-05-18 ENCOUNTER — Ambulatory Visit: Payer: Medicare Other | Admitting: Cardiovascular Disease

## 2017-05-18 VITALS — BP 110/58 | HR 77 | Ht 70.0 in

## 2017-05-18 DIAGNOSIS — N184 Chronic kidney disease, stage 4 (severe): Secondary | ICD-10-CM | POA: Diagnosis not present

## 2017-05-18 DIAGNOSIS — I2510 Atherosclerotic heart disease of native coronary artery without angina pectoris: Secondary | ICD-10-CM | POA: Diagnosis not present

## 2017-05-18 DIAGNOSIS — I5042 Chronic combined systolic (congestive) and diastolic (congestive) heart failure: Secondary | ICD-10-CM | POA: Diagnosis not present

## 2017-05-18 DIAGNOSIS — I5032 Chronic diastolic (congestive) heart failure: Secondary | ICD-10-CM | POA: Diagnosis not present

## 2017-05-18 DIAGNOSIS — N400 Enlarged prostate without lower urinary tract symptoms: Secondary | ICD-10-CM | POA: Diagnosis not present

## 2017-05-18 DIAGNOSIS — D631 Anemia in chronic kidney disease: Secondary | ICD-10-CM | POA: Diagnosis not present

## 2017-05-18 DIAGNOSIS — Z8673 Personal history of transient ischemic attack (TIA), and cerebral infarction without residual deficits: Secondary | ICD-10-CM | POA: Diagnosis not present

## 2017-05-18 DIAGNOSIS — E1151 Type 2 diabetes mellitus with diabetic peripheral angiopathy without gangrene: Secondary | ICD-10-CM | POA: Diagnosis not present

## 2017-05-18 DIAGNOSIS — T8141XA Infection following a procedure, superficial incisional surgical site, initial encounter: Secondary | ICD-10-CM | POA: Diagnosis not present

## 2017-05-18 DIAGNOSIS — E11610 Type 2 diabetes mellitus with diabetic neuropathic arthropathy: Secondary | ICD-10-CM | POA: Diagnosis not present

## 2017-05-18 DIAGNOSIS — I2583 Coronary atherosclerosis due to lipid rich plaque: Secondary | ICD-10-CM

## 2017-05-18 DIAGNOSIS — R079 Chest pain, unspecified: Secondary | ICD-10-CM | POA: Diagnosis not present

## 2017-05-18 DIAGNOSIS — E1122 Type 2 diabetes mellitus with diabetic chronic kidney disease: Secondary | ICD-10-CM | POA: Diagnosis not present

## 2017-05-18 DIAGNOSIS — I130 Hypertensive heart and chronic kidney disease with heart failure and stage 1 through stage 4 chronic kidney disease, or unspecified chronic kidney disease: Secondary | ICD-10-CM | POA: Diagnosis not present

## 2017-05-18 NOTE — Patient Instructions (Addendum)
Medication Instructions:  Your physician recommends that you continue on your current medications as directed. Please refer to the Current Medication list given to you today.  Labwork: NONE  Testing/Procedures: Your physician has requested that you have a lexiscan myoview. For further information please visit [email or web address]. Please follow instruction sheet, as given.  Follow-Up: Your physician wants you to follow-up as needed with Dr. Nishan.    If you need a refill on your cardiac medications before your next appointment, please call your pharmacy.    

## 2017-05-21 DIAGNOSIS — N400 Enlarged prostate without lower urinary tract symptoms: Secondary | ICD-10-CM | POA: Diagnosis not present

## 2017-05-21 DIAGNOSIS — N184 Chronic kidney disease, stage 4 (severe): Secondary | ICD-10-CM | POA: Diagnosis not present

## 2017-05-21 DIAGNOSIS — E1122 Type 2 diabetes mellitus with diabetic chronic kidney disease: Secondary | ICD-10-CM | POA: Diagnosis not present

## 2017-05-21 DIAGNOSIS — Z8673 Personal history of transient ischemic attack (TIA), and cerebral infarction without residual deficits: Secondary | ICD-10-CM | POA: Diagnosis not present

## 2017-05-21 DIAGNOSIS — R1310 Dysphagia, unspecified: Secondary | ICD-10-CM | POA: Diagnosis not present

## 2017-05-21 DIAGNOSIS — E11610 Type 2 diabetes mellitus with diabetic neuropathic arthropathy: Secondary | ICD-10-CM | POA: Diagnosis not present

## 2017-05-21 DIAGNOSIS — I10 Essential (primary) hypertension: Secondary | ICD-10-CM | POA: Diagnosis not present

## 2017-05-21 DIAGNOSIS — E1151 Type 2 diabetes mellitus with diabetic peripheral angiopathy without gangrene: Secondary | ICD-10-CM | POA: Diagnosis not present

## 2017-05-21 DIAGNOSIS — I130 Hypertensive heart and chronic kidney disease with heart failure and stage 1 through stage 4 chronic kidney disease, or unspecified chronic kidney disease: Secondary | ICD-10-CM | POA: Diagnosis not present

## 2017-05-21 DIAGNOSIS — I5032 Chronic diastolic (congestive) heart failure: Secondary | ICD-10-CM | POA: Diagnosis not present

## 2017-05-21 DIAGNOSIS — R490 Dysphonia: Secondary | ICD-10-CM | POA: Diagnosis not present

## 2017-05-21 DIAGNOSIS — D631 Anemia in chronic kidney disease: Secondary | ICD-10-CM | POA: Diagnosis not present

## 2017-05-21 DIAGNOSIS — T8141XA Infection following a procedure, superficial incisional surgical site, initial encounter: Secondary | ICD-10-CM | POA: Diagnosis not present

## 2017-05-22 ENCOUNTER — Other Ambulatory Visit (HOSPITAL_COMMUNITY): Payer: Self-pay | Admitting: Internal Medicine

## 2017-05-22 DIAGNOSIS — R1310 Dysphagia, unspecified: Secondary | ICD-10-CM

## 2017-05-23 DIAGNOSIS — N400 Enlarged prostate without lower urinary tract symptoms: Secondary | ICD-10-CM | POA: Diagnosis not present

## 2017-05-23 DIAGNOSIS — T8141XA Infection following a procedure, superficial incisional surgical site, initial encounter: Secondary | ICD-10-CM | POA: Diagnosis not present

## 2017-05-23 DIAGNOSIS — N2581 Secondary hyperparathyroidism of renal origin: Secondary | ICD-10-CM | POA: Diagnosis not present

## 2017-05-23 DIAGNOSIS — Z8673 Personal history of transient ischemic attack (TIA), and cerebral infarction without residual deficits: Secondary | ICD-10-CM | POA: Diagnosis not present

## 2017-05-23 DIAGNOSIS — E1122 Type 2 diabetes mellitus with diabetic chronic kidney disease: Secondary | ICD-10-CM | POA: Diagnosis not present

## 2017-05-23 DIAGNOSIS — I130 Hypertensive heart and chronic kidney disease with heart failure and stage 1 through stage 4 chronic kidney disease, or unspecified chronic kidney disease: Secondary | ICD-10-CM | POA: Diagnosis not present

## 2017-05-23 DIAGNOSIS — D649 Anemia, unspecified: Secondary | ICD-10-CM | POA: Diagnosis not present

## 2017-05-23 DIAGNOSIS — N184 Chronic kidney disease, stage 4 (severe): Secondary | ICD-10-CM | POA: Diagnosis not present

## 2017-05-23 DIAGNOSIS — D631 Anemia in chronic kidney disease: Secondary | ICD-10-CM | POA: Diagnosis not present

## 2017-05-23 DIAGNOSIS — E1151 Type 2 diabetes mellitus with diabetic peripheral angiopathy without gangrene: Secondary | ICD-10-CM | POA: Diagnosis not present

## 2017-05-23 DIAGNOSIS — E11610 Type 2 diabetes mellitus with diabetic neuropathic arthropathy: Secondary | ICD-10-CM | POA: Diagnosis not present

## 2017-05-23 DIAGNOSIS — I5032 Chronic diastolic (congestive) heart failure: Secondary | ICD-10-CM | POA: Diagnosis not present

## 2017-05-23 DIAGNOSIS — I129 Hypertensive chronic kidney disease with stage 1 through stage 4 chronic kidney disease, or unspecified chronic kidney disease: Secondary | ICD-10-CM | POA: Diagnosis not present

## 2017-05-24 ENCOUNTER — Ambulatory Visit: Payer: Medicare Other | Admitting: Neurology

## 2017-05-24 ENCOUNTER — Telehealth (INDEPENDENT_AMBULATORY_CARE_PROVIDER_SITE_OTHER): Payer: Self-pay | Admitting: Orthopedic Surgery

## 2017-05-24 ENCOUNTER — Encounter: Payer: Self-pay | Admitting: Neurology

## 2017-05-24 VITALS — BP 98/55 | HR 66

## 2017-05-24 DIAGNOSIS — R1314 Dysphagia, pharyngoesophageal phase: Secondary | ICD-10-CM | POA: Diagnosis not present

## 2017-05-24 DIAGNOSIS — R490 Dysphonia: Secondary | ICD-10-CM | POA: Diagnosis not present

## 2017-05-24 DIAGNOSIS — R413 Other amnesia: Secondary | ICD-10-CM

## 2017-05-24 DIAGNOSIS — G40909 Epilepsy, unspecified, not intractable, without status epilepticus: Secondary | ICD-10-CM

## 2017-05-24 NOTE — Telephone Encounter (Signed)
Cindy from [facility] called asking for verbal approval on 2 week 5 for nursing. CB # [phone]

## 2017-05-24 NOTE — Telephone Encounter (Signed)
Called and gave verbal ok for pt to have orders as below to [facility]. To call with questions.
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
## 2017-05-25 ENCOUNTER — Inpatient Hospital Stay (HOSPITAL_COMMUNITY): Payer: Medicare Other

## 2017-05-25 ENCOUNTER — Inpatient Hospital Stay (HOSPITAL_COMMUNITY)
Admission: EM | Admit: 2017-05-25 | Discharge: 2017-05-28 | DRG: 682 | Disposition: A | Discharge status: Home Health | Payer: Medicare Other | Attending: Internal Medicine | Admitting: Internal Medicine

## 2017-05-25 ENCOUNTER — Emergency Department (HOSPITAL_COMMUNITY): Payer: Medicare Other

## 2017-05-25 ENCOUNTER — Encounter (HOSPITAL_COMMUNITY): Payer: Self-pay | Admitting: Emergency Medicine

## 2017-05-25 DIAGNOSIS — G40909 Epilepsy, unspecified, not intractable, without status epilepticus: Secondary | ICD-10-CM | POA: Diagnosis present

## 2017-05-25 DIAGNOSIS — N19 Unspecified kidney failure: Secondary | ICD-10-CM | POA: Diagnosis not present

## 2017-05-25 DIAGNOSIS — R531 Weakness: Secondary | ICD-10-CM

## 2017-05-25 DIAGNOSIS — Z681 Body mass index (BMI) 19 or less, adult: Secondary | ICD-10-CM | POA: Diagnosis not present

## 2017-05-25 DIAGNOSIS — E860 Dehydration: Secondary | ICD-10-CM

## 2017-05-25 DIAGNOSIS — Z794 Long term (current) use of insulin: Secondary | ICD-10-CM

## 2017-05-25 DIAGNOSIS — I132 Hypertensive heart and chronic kidney disease with heart failure and with stage 5 chronic kidney disease, or end stage renal disease: Secondary | ICD-10-CM | POA: Diagnosis present

## 2017-05-25 DIAGNOSIS — E870 Hyperosmolality and hypernatremia: Secondary | ICD-10-CM

## 2017-05-25 DIAGNOSIS — L97519 Non-pressure chronic ulcer of other part of right foot with unspecified severity: Secondary | ICD-10-CM | POA: Diagnosis present

## 2017-05-25 DIAGNOSIS — E11649 Type 2 diabetes mellitus with hypoglycemia without coma: Secondary | ICD-10-CM | POA: Diagnosis not present

## 2017-05-25 DIAGNOSIS — M1990 Unspecified osteoarthritis, unspecified site: Secondary | ICD-10-CM | POA: Diagnosis not present

## 2017-05-25 DIAGNOSIS — I10 Essential (primary) hypertension: Secondary | ICD-10-CM | POA: Diagnosis present

## 2017-05-25 DIAGNOSIS — F4321 Adjustment disorder with depressed mood: Secondary | ICD-10-CM

## 2017-05-25 DIAGNOSIS — Z7902 Long term (current) use of antithrombotics/antiplatelets: Secondary | ICD-10-CM

## 2017-05-25 DIAGNOSIS — E861 Hypovolemia: Secondary | ICD-10-CM | POA: Diagnosis not present

## 2017-05-25 DIAGNOSIS — E11622 Type 2 diabetes mellitus with other skin ulcer: Secondary | ICD-10-CM | POA: Diagnosis present

## 2017-05-25 DIAGNOSIS — R1310 Dysphagia, unspecified: Secondary | ICD-10-CM | POA: Diagnosis not present

## 2017-05-25 DIAGNOSIS — Z885 Allergy status to narcotic agent status: Secondary | ICD-10-CM

## 2017-05-25 DIAGNOSIS — N184 Chronic kidney disease, stage 4 (severe): Secondary | ICD-10-CM | POA: Diagnosis present

## 2017-05-25 DIAGNOSIS — E1140 Type 2 diabetes mellitus with diabetic neuropathy, unspecified: Secondary | ICD-10-CM | POA: Diagnosis not present

## 2017-05-25 DIAGNOSIS — I130 Hypertensive heart and chronic kidney disease with heart failure and stage 1 through stage 4 chronic kidney disease, or unspecified chronic kidney disease: Secondary | ICD-10-CM | POA: Diagnosis not present

## 2017-05-25 DIAGNOSIS — E1122 Type 2 diabetes mellitus with diabetic chronic kidney disease: Secondary | ICD-10-CM | POA: Diagnosis not present

## 2017-05-25 DIAGNOSIS — N179 Acute kidney failure, unspecified: Secondary | ICD-10-CM | POA: Diagnosis not present

## 2017-05-25 DIAGNOSIS — E1151 Type 2 diabetes mellitus with diabetic peripheral angiopathy without gangrene: Secondary | ICD-10-CM | POA: Diagnosis not present

## 2017-05-25 DIAGNOSIS — F0390 Unspecified dementia without behavioral disturbance: Secondary | ICD-10-CM | POA: Diagnosis present

## 2017-05-25 DIAGNOSIS — D631 Anemia in chronic kidney disease: Secondary | ICD-10-CM | POA: Diagnosis not present

## 2017-05-25 DIAGNOSIS — Z79899 Other long term (current) drug therapy: Secondary | ICD-10-CM

## 2017-05-25 DIAGNOSIS — E1165 Type 2 diabetes mellitus with hyperglycemia: Secondary | ICD-10-CM | POA: Diagnosis present

## 2017-05-25 DIAGNOSIS — T8781 Dehiscence of amputation stump: Secondary | ICD-10-CM | POA: Diagnosis not present

## 2017-05-25 DIAGNOSIS — R0602 Shortness of breath: Secondary | ICD-10-CM | POA: Diagnosis not present

## 2017-05-25 DIAGNOSIS — N183 Chronic kidney disease, stage 3 (moderate): Secondary | ICD-10-CM | POA: Diagnosis not present

## 2017-05-25 DIAGNOSIS — I5032 Chronic diastolic (congestive) heart failure: Secondary | ICD-10-CM | POA: Diagnosis not present

## 2017-05-25 DIAGNOSIS — Z993 Dependence on wheelchair: Secondary | ICD-10-CM

## 2017-05-25 DIAGNOSIS — R404 Transient alteration of awareness: Secondary | ICD-10-CM | POA: Diagnosis not present

## 2017-05-25 DIAGNOSIS — N400 Enlarged prostate without lower urinary tract symptoms: Secondary | ICD-10-CM | POA: Diagnosis present

## 2017-05-25 DIAGNOSIS — E869 Volume depletion, unspecified: Secondary | ICD-10-CM

## 2017-05-25 DIAGNOSIS — E875 Hyperkalemia: Secondary | ICD-10-CM | POA: Diagnosis present

## 2017-05-25 DIAGNOSIS — Z8673 Personal history of transient ischemic attack (TIA), and cerebral infarction without residual deficits: Secondary | ICD-10-CM

## 2017-05-25 DIAGNOSIS — Z89421 Acquired absence of other right toe(s): Secondary | ICD-10-CM

## 2017-05-25 DIAGNOSIS — Z810 Family history of intellectual disabilities: Secondary | ICD-10-CM | POA: Diagnosis not present

## 2017-05-25 DIAGNOSIS — D649 Anemia, unspecified: Secondary | ICD-10-CM | POA: Diagnosis not present

## 2017-05-25 DIAGNOSIS — E43 Unspecified severe protein-calorie malnutrition: Secondary | ICD-10-CM | POA: Diagnosis not present

## 2017-05-25 DIAGNOSIS — Z89512 Acquired absence of left leg below knee: Secondary | ICD-10-CM

## 2017-05-25 DIAGNOSIS — Z880 Allergy status to penicillin: Secondary | ICD-10-CM

## 2017-05-25 LAB — COMPREHENSIVE METABOLIC PANEL
ALK PHOS: 121 U/L (ref 38–126)
ALT: 17 U/L (ref 17–63)
ANION GAP: 13 (ref 5–15)
AST: 27 U/L (ref 15–41)
Albumin: 3.1 g/dL — ABNORMAL LOW (ref 3.5–5.0)
BILIRUBIN TOTAL: 0.6 mg/dL (ref 0.3–1.2)
BUN: 126 mg/dL — ABNORMAL HIGH (ref 6–20)
CALCIUM: 9.6 mg/dL (ref 8.9–10.3)
CO2: 20 mmol/L — ABNORMAL LOW (ref 22–32)
Chloride: 113 mmol/L — ABNORMAL HIGH (ref 101–111)
Creatinine, Ser: 3.54 mg/dL — ABNORMAL HIGH (ref 0.61–1.24)
GFR calc non Af Amer: 16 mL/min — ABNORMAL LOW (ref >60)
GFR, EST AFRICAN AMERICAN: 19 mL/min — AB (ref >60)
GLUCOSE: 140 mg/dL — AB (ref 65–99)
Potassium: 5 mmol/L (ref 3.5–5.1)
Sodium: 146 mmol/L — ABNORMAL HIGH (ref 135–145)
TOTAL PROTEIN: 6.7 g/dL (ref 6.5–8.1)

## 2017-05-25 LAB — URINALYSIS, ROUTINE W REFLEX MICROSCOPIC
Bacteria, UA: NONE SEEN
Bilirubin Urine: NEGATIVE
Glucose, UA: NEGATIVE mg/dL
HGB URINE DIPSTICK: NEGATIVE
Ketones, ur: NEGATIVE mg/dL
LEUKOCYTES UA: NEGATIVE
NITRITE: NEGATIVE
Protein, ur: 30 mg/dL — AB
SPECIFIC GRAVITY, URINE: 1.013 (ref 1.005–1.030)
SQUAMOUS EPITHELIAL / LPF: NONE SEEN
pH: 5 (ref 5.0–8.0)

## 2017-05-25 LAB — CBC
HEMATOCRIT: 35.7 % — AB (ref 39.0–52.0)
Hemoglobin: 10.9 g/dL — ABNORMAL LOW (ref 13.0–17.0)
MCH: 28.5 pg (ref 26.0–34.0)
MCHC: 30.5 g/dL (ref 30.0–36.0)
MCV: 93.5 fL (ref 78.0–100.0)
Platelets: 198 10*3/uL (ref 150–400)
RBC: 3.82 MIL/uL — ABNORMAL LOW (ref 4.22–5.81)
RDW: 14.4 % (ref 11.5–15.5)
WBC: 9.6 10*3/uL (ref 4.0–10.5)

## 2017-05-25 LAB — I-STAT CHEM 8, ED
BUN: 109 mg/dL — ABNORMAL HIGH (ref 6–20)
CREATININE: 3.7 mg/dL — AB (ref 0.61–1.24)
Calcium, Ion: 1.23 mmol/L (ref 1.15–1.40)
Chloride: 117 mmol/L — ABNORMAL HIGH (ref 101–111)
Glucose, Bld: 143 mg/dL — ABNORMAL HIGH (ref 65–99)
HEMATOCRIT: 25 % — AB (ref 39.0–52.0)
Hemoglobin: 8.5 g/dL — ABNORMAL LOW (ref 13.0–17.0)
POTASSIUM: 5.4 mmol/L — AB (ref 3.5–5.1)
SODIUM: 145 mmol/L (ref 135–145)
TCO2: 21 mmol/L — ABNORMAL LOW (ref 22–32)

## 2017-05-25 LAB — GLUCOSE, CAPILLARY
GLUCOSE-CAPILLARY: 152 mg/dL — AB (ref 65–99)
GLUCOSE-CAPILLARY: 176 mg/dL — AB (ref 65–99)

## 2017-05-25 LAB — CBG MONITORING, ED: GLUCOSE-CAPILLARY: 109 mg/dL — AB (ref 65–99)

## 2017-05-25 LAB — I-STAT TROPONIN, ED: TROPONIN I, POC: 0 ng/mL (ref 0.00–0.08)

## 2017-05-25 MED ORDER — SODIUM CHLORIDE 0.45 % IV SOLN
INTRAVENOUS | Status: DC
  Administered 2017-05-25 – 2017-05-28 (×4): via INTRAVENOUS
  Filled 2017-05-25 (×9): qty 1000

## 2017-05-25 MED ORDER — ISOSORB DINITRATE-HYDRALAZINE 20-37.5 MG PO TABS
1.0000 | ORAL_TABLET | Freq: Two times a day (BID) | ORAL | Status: DC
  Administered 2017-05-25 – 2017-05-28 (×6): 1 via ORAL
  Filled 2017-05-25 (×6): qty 1

## 2017-05-25 MED ORDER — LAMOTRIGINE 100 MG PO TABS
100.0000 mg | ORAL_TABLET | Freq: Two times a day (BID) | ORAL | Status: DC
  Administered 2017-05-25 – 2017-05-28 (×6): 100 mg via ORAL
  Filled 2017-05-25 (×6): qty 1

## 2017-05-25 MED ORDER — INSULIN DETEMIR 100 UNIT/ML ~~LOC~~ SOLN
9.0000 [IU] | Freq: Every day | SUBCUTANEOUS | Status: DC
  Administered 2017-05-25: 9 [IU] via SUBCUTANEOUS
  Filled 2017-05-25: qty 0.09

## 2017-05-25 MED ORDER — VITAMIN C 500 MG PO TABS
500.0000 mg | ORAL_TABLET | Freq: Every day | ORAL | Status: DC
  Administered 2017-05-25 – 2017-05-28 (×4): 500 mg via ORAL
  Filled 2017-05-25 (×4): qty 1

## 2017-05-25 MED ORDER — VENLAFAXINE HCL ER 75 MG PO CP24
75.0000 mg | ORAL_CAPSULE | Freq: Every day | ORAL | Status: DC
  Administered 2017-05-25 – 2017-05-28 (×4): 75 mg via ORAL
  Filled 2017-05-25 (×4): qty 1

## 2017-05-25 MED ORDER — ONDANSETRON HCL 4 MG PO TABS: 4.0000 mg | ORAL_TABLET | Freq: Four times a day (QID) | ORAL | Status: DC | PRN

## 2017-05-25 MED ORDER — ACETAMINOPHEN 650 MG RE SUPP: 650.0000 mg | Freq: Four times a day (QID) | RECTAL | Status: DC | PRN

## 2017-05-25 MED ORDER — ACETAMINOPHEN 325 MG PO TABS: 650.0000 mg | ORAL_TABLET | Freq: Four times a day (QID) | ORAL | Status: DC | PRN

## 2017-05-25 MED ORDER — AZITHROMYCIN 250 MG PO TABS
250.0000 mg | ORAL_TABLET | Freq: Every day | ORAL | Status: DC
  Administered 2017-05-25 – 2017-05-28 (×4): 250 mg via ORAL
  Filled 2017-05-25 (×4): qty 1

## 2017-05-25 MED ORDER — INSULIN ASPART 100 UNIT/ML ~~LOC~~ SOLN
0.0000 [IU] | Freq: Three times a day (TID) | SUBCUTANEOUS | Status: DC
  Administered 2017-05-26 (×2): 2 [IU] via SUBCUTANEOUS

## 2017-05-25 MED ORDER — ATORVASTATIN CALCIUM 20 MG PO TABS
20.0000 mg | ORAL_TABLET | Freq: Every evening | ORAL | Status: DC
  Administered 2017-05-25 – 2017-05-27 (×3): 20 mg via ORAL
  Filled 2017-05-25 (×3): qty 1

## 2017-05-25 MED ORDER — SODIUM CHLORIDE 0.9 % IV SOLN
INTRAVENOUS | Status: DC
  Administered 2017-05-25: 13:00:00 via INTRAVENOUS

## 2017-05-25 MED ORDER — MEMANTINE HCL 10 MG PO TABS
10.0000 mg | ORAL_TABLET | Freq: Two times a day (BID) | ORAL | Status: DC
  Administered 2017-05-25 – 2017-05-28 (×6): 10 mg via ORAL
  Filled 2017-05-25 (×6): qty 1

## 2017-05-25 MED ORDER — ADULT MULTIVITAMIN W/MINERALS CH
1.0000 | ORAL_TABLET | Freq: Every day | ORAL | Status: DC
  Administered 2017-05-25 – 2017-05-28 (×4): 1 via ORAL
  Filled 2017-05-25 (×4): qty 1

## 2017-05-25 MED ORDER — TRAMADOL HCL 50 MG PO TABS
50.0000 mg | ORAL_TABLET | Freq: Four times a day (QID) | ORAL | Status: DC | PRN
  Administered 2017-05-27: 50 mg via ORAL
  Filled 2017-05-25: qty 1

## 2017-05-25 MED ORDER — SODIUM CHLORIDE 0.9 % IV SOLN
INTRAVENOUS | Status: DC
  Administered 2017-05-25: 16:00:00 via INTRAVENOUS

## 2017-05-25 MED ORDER — POLYETHYLENE GLYCOL 3350 17 G PO PACK: 17.0000 g | PACK | Freq: Every day | ORAL | Status: DC | PRN

## 2017-05-25 MED ORDER — TAMSULOSIN HCL 0.4 MG PO CAPS
0.4000 mg | ORAL_CAPSULE | Freq: Every day | ORAL | Status: DC
  Administered 2017-05-25 – 2017-05-28 (×4): 0.4 mg via ORAL
  Filled 2017-05-25 (×4): qty 1

## 2017-05-25 MED ORDER — ENOXAPARIN SODIUM 30 MG/0.3ML ~~LOC~~ SOLN
30.0000 mg | SUBCUTANEOUS | Status: DC
  Administered 2017-05-25 – 2017-05-27 (×3): 30 mg via SUBCUTANEOUS
  Filled 2017-05-25 (×3): qty 0.3

## 2017-05-25 MED ORDER — CLOPIDOGREL BISULFATE 75 MG PO TABS
75.0000 mg | ORAL_TABLET | Freq: Every day | ORAL | Status: DC
  Administered 2017-05-25 – 2017-05-28 (×4): 75 mg via ORAL
  Filled 2017-05-25 (×4): qty 1

## 2017-05-25 MED ORDER — DOCUSATE SODIUM 100 MG PO CAPS
100.0000 mg | ORAL_CAPSULE | Freq: Two times a day (BID) | ORAL | Status: DC
  Administered 2017-05-25 – 2017-05-28 (×6): 100 mg via ORAL
  Filled 2017-05-25 (×6): qty 1

## 2017-05-25 MED ORDER — LATANOPROST 0.005 % OP SOLN
1.0000 [drp] | Freq: Every day | OPHTHALMIC | Status: DC
  Administered 2017-05-25 – 2017-05-27 (×3): 1 [drp] via OPHTHALMIC
  Filled 2017-05-25: qty 2.5

## 2017-05-25 MED ORDER — ONDANSETRON HCL 4 MG/2ML IJ SOLN: 4.0000 mg | Freq: Four times a day (QID) | INTRAMUSCULAR | Status: DC | PRN

## 2017-05-25 MED ORDER — TIMOLOL HEMIHYDRATE 0.5 % OP SOLN: 1.0000 [drp] | Freq: Two times a day (BID) | OPHTHALMIC | Status: DC

## 2017-05-25 MED ORDER — CARVEDILOL 12.5 MG PO TABS
6.2500 mg | ORAL_TABLET | Freq: Two times a day (BID) | ORAL | Status: DC
  Administered 2017-05-25 – 2017-05-28 (×6): 6.25 mg via ORAL
  Filled 2017-05-25 (×6): qty 1

## 2017-05-25 MED ORDER — INSULIN ASPART 100 UNIT/ML ~~LOC~~ SOLN
0.0000 [IU] | Freq: Every day | SUBCUTANEOUS | Status: DC
  Administered 2017-05-26: 5 [IU] via SUBCUTANEOUS

## 2017-05-25 MED ORDER — GABAPENTIN 400 MG PO CAPS
400.0000 mg | ORAL_CAPSULE | Freq: Every day | ORAL | Status: DC
  Administered 2017-05-25 – 2017-05-27 (×3): 400 mg via ORAL
  Filled 2017-05-25 (×3): qty 1

## 2017-05-25 MED ORDER — TIMOLOL MALEATE 0.5 % OP SOLN
1.0000 [drp] | Freq: Two times a day (BID) | OPHTHALMIC | Status: DC
  Administered 2017-05-25 – 2017-05-28 (×6): 1 [drp] via OPHTHALMIC
  Filled 2017-05-25 (×2): qty 5

## 2017-05-25 MED ORDER — CENTRUM SILVER ADULT 50+ PO TABS: ORAL_TABLET | Freq: Every day | ORAL | Status: DC

## 2017-05-25 MED ORDER — ENOXAPARIN SODIUM 40 MG/0.4ML ~~LOC~~ SOLN: 40.0000 mg | SUBCUTANEOUS | Status: DC

## 2017-05-25 MED ORDER — SODIUM CHLORIDE 0.9 % IV BOLUS (SEPSIS)
500.0000 mL | Freq: Once | INTRAVENOUS | Status: AC
Start: 2017-05-25 — End: 2017-05-25
  Administered 2017-05-25: 500 mL via INTRAVENOUS
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
## 2017-05-25 NOTE — ED Triage Notes (Signed)
Pt arrived via EMS from home with complaints of weakness that started this morning. Pt reports he will be getting a central line next week to possibly start HD. Pt had a ulcer removed from right foot last week.

## 2017-05-26 ENCOUNTER — Inpatient Hospital Stay (HOSPITAL_COMMUNITY): Payer: Medicare Other

## 2017-05-26 DIAGNOSIS — Z810 Family history of intellectual disabilities: Secondary | ICD-10-CM

## 2017-05-26 DIAGNOSIS — N179 Acute kidney failure, unspecified: Principal | ICD-10-CM

## 2017-05-26 DIAGNOSIS — F4321 Adjustment disorder with depressed mood: Secondary | ICD-10-CM

## 2017-05-26 DIAGNOSIS — E860 Dehydration: Secondary | ICD-10-CM

## 2017-05-26 DIAGNOSIS — E1165 Type 2 diabetes mellitus with hyperglycemia: Secondary | ICD-10-CM

## 2017-05-26 DIAGNOSIS — R1310 Dysphagia, unspecified: Secondary | ICD-10-CM

## 2017-05-26 DIAGNOSIS — R531 Weakness: Secondary | ICD-10-CM

## 2017-05-26 DIAGNOSIS — N184 Chronic kidney disease, stage 4 (severe): Secondary | ICD-10-CM

## 2017-05-26 LAB — RENAL FUNCTION PANEL
ALBUMIN: 2.5 g/dL — AB (ref 3.5–5.0)
Anion gap: 11 (ref 5–15)
BUN: 102 mg/dL — AB (ref 6–20)
CALCIUM: 8.8 mg/dL — AB (ref 8.9–10.3)
CO2: 21 mmol/L — AB (ref 22–32)
Chloride: 112 mmol/L — ABNORMAL HIGH (ref 101–111)
Creatinine, Ser: 3.19 mg/dL — ABNORMAL HIGH (ref 0.61–1.24)
GFR calc Af Amer: 21 mL/min — ABNORMAL LOW (ref >60)
GFR calc non Af Amer: 18 mL/min — ABNORMAL LOW (ref >60)
GLUCOSE: 108 mg/dL — AB (ref 65–99)
PHOSPHORUS: 4.3 mg/dL (ref 2.5–4.6)
POTASSIUM: 4.3 mmol/L (ref 3.5–5.1)
SODIUM: 144 mmol/L (ref 135–145)

## 2017-05-26 LAB — GLUCOSE, CAPILLARY
GLUCOSE-CAPILLARY: 169 mg/dL — AB (ref 65–99)
GLUCOSE-CAPILLARY: 166 mg/dL — AB (ref 65–99)
Glucose-Capillary: 67 mg/dL (ref 65–99)
Glucose-Capillary: 362 mg/dL — ABNORMAL HIGH (ref 65–99)

## 2017-05-26 LAB — CBC
HCT: 30.9 % — ABNORMAL LOW (ref 39.0–52.0)
Hemoglobin: 9.6 g/dL — ABNORMAL LOW (ref 13.0–17.0)
MCH: 28.7 pg (ref 26.0–34.0)
MCHC: 31.1 g/dL (ref 30.0–36.0)
MCV: 92.2 fL (ref 78.0–100.0)
Platelets: 205 10*3/uL (ref 150–400)
RBC: 3.35 MIL/uL — ABNORMAL LOW (ref 4.22–5.81)
RDW: 14.1 % (ref 11.5–15.5)
WBC: 8.7 10*3/uL (ref 4.0–10.5)

## 2017-05-26 LAB — IRON AND TIBC
Iron: 52 ug/dL (ref 45–182)
SATURATION RATIOS: 51 % — AB (ref 17.9–39.5)
TIBC: 102 ug/dL — ABNORMAL LOW (ref 250–450)
UIBC: 50 ug/dL

## 2017-05-26 MED ORDER — NEPRO/CARBSTEADY PO LIQD
237.0000 mL | Freq: Two times a day (BID) | ORAL | Status: DC
  Administered 2017-05-27 – 2017-05-28 (×4): 237 mL via ORAL
  Filled 2017-05-26 (×6): qty 237

## 2017-05-26 MED ORDER — DARBEPOETIN ALFA 100 MCG/0.5ML IJ SOSY
100.0000 ug | PREFILLED_SYRINGE | INTRAMUSCULAR | Status: DC
  Administered 2017-05-26: 100 ug via SUBCUTANEOUS
  Filled 2017-05-26: qty 0.5

## 2017-05-26 MED ORDER — PRO-STAT SUGAR FREE PO LIQD
30.0000 mL | Freq: Two times a day (BID) | ORAL | Status: DC
  Administered 2017-05-27 (×2): 30 mL via ORAL
  Filled 2017-05-26 (×2): qty 30

## 2017-05-26 NOTE — Progress Notes (Signed)
Barium swallow done, back from Radiology department via bed. Alert,oriented x4.
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
## 2017-05-26 NOTE — Progress Notes (Signed)
Initial Nutrition Assessment  DOCUMENTATION CODES:   Severe malnutrition in context of chronic illness  INTERVENTION:  Nepro Shake po BID each supplement provides 425 kcal and 19 grams protein  Will order 30 mL Prostat BID, each supplement provides 100 kcal and 15 grams of protein.  Recommend diet liberalization to consistent carb or atleast Consistent Carb/Heart healthy to promote oral intake  48 Hr Calorie Count  F/U next week.  NUTRITION DIAGNOSIS:  Severe Malnutrition related to poor appetite, restrictive diets, taste changes, dysphagia  chronic illness(HF, CKDIV-V, DM2) as evidenced by severe muscle/fat loss and a loss of 18% of his bw in only 6 months  GOAL:  Patient will meet greater than or equal to 90% of their needs  MONITOR:  PO intake, Supplement acceptance, Diet advancement, Labs, Weight trends, Skin, I & O's  REASON FOR ASSESSMENT:  Consult Assessment of nutrition requirement/status, Diet education, Calorie Count  ASSESSMENT:  72 y/o male PMHx DM2, CHF, CKD4/5, Seizures, CVA, recent admission 2-22-2/25 for debridement of R foot ulcer. Presents due to continued poor PO intake, weakness, trouble chewing, fatigue with chewing and found to have acute uremia. RD asked to assess patient.  RD had prolonged conversation with pt wife and other family member.   Pt apparently has had poor intake for months. The most causative factors are lack of any appetite, constipation and taste changes. However, the wife also sounds to be having an EXTREMELY difficult time managing all the dietary restrictions the patient has been asked to follow. He had been on a renal, DM diet with a 1.2 fluid restriction.   Weight wise, the patient has experienced severe weight loss. He was maintaining his weight in the 150's, 160's since 2014. However, since September, when he was 161 lbs, he has lost ~29 lbs. (18% bw)  We discussed how his chronic disease states also are likely a major factor in his  malnutrition. Explained how renal failure manifests in poor appetite, taste changes and fatigue. Family asked appropriate questions relating to need to restrict K, Phos, Na and fluid.   Surprisingly, the patient or wife had never even heard of Nepro. Explained that this was potentially not mentioned by MD due this supplement being high in protein and fear of increased protein hastening renal failure or leading to uremia.   We discussed can be done at this time to improve his nutritional state. They were agreeable to trying supplements, prostat and Nepro. RD also reviewed the Calorie count procedures. They were also interested in obtaining a calorie count  RD asked the patient if he felt he would eat better if he was on a less restrictive diet. Wife asked him what he would eat that he is unable to order now. He said fried fish and a "whopper". RD would recommended trying patient on a less restrictive diet, as he is quickly declining from malnutrition.   We briefly discussed potential efficacy of appetite stimulants. Wife was not interested din "another medication". She also volunteered the information that they would not want a feeding tube.   Physical Exam: Pt is emaciated. Severe fat/muscle wasting of near entire body  Labs Bgs: 70-170, Albumin:2.5, K:4.3, Phos: 2.5, A1C on 2/15: 8.0 Meds: PO abx, Colace, mvi, Namenda, Vit C, IVF  NUTRITION - FOCUSED PHYSICAL EXAM:   Most Recent Value  Orbital Region  Moderate depletion  Upper Arm Region  Severe depletion  Thoracic and Lumbar Region  Severe depletion  Buccal Region  Severe depletion  [place] Region  Moderate depletion  Clavicle Bone Region  Severe depletion  Clavicle and Acromion Bone Region  Severe depletion  Scapular Bone Region  Unable to assess  Dorsal Hand  Severe depletion  Patellar Region  Severe depletion  Anterior Thigh Region  Severe depletion  Posterior Calf Region  Severe depletion  Edema (RD Assessment)  None     Diet  Order:  Diet renal/carb modified with fluid restriction Diet-HS Snack? Nothing; Fluid restriction: Other (see comments); Room service appropriate? Yes; Fluid consistency: Thin  EDUCATION NEEDS:  No education needs have been identified at this time  Skin: L BKA, Incision to R foot  Last BM:  3/7  Height:  Ht Readings from Last 1 Encounters:  05/25/17 5\' 10"  (1.778 m)   Weight:  Wt Readings from Last 1 Encounters:  05/26/17 122 lb 12.7 oz (55.7 kg)   Wt Readings from Last 10 Encounters:  05/26/17 122 lb 12.7 oz (55.7 kg)  05/14/17 122 lb 2.2 oz (55.4 kg)  05/08/17 128 lb 8.5 oz (58.3 kg)  05/04/17 159 lb (72.1 kg)  04/23/17 159 lb (72.1 kg)  12/11/16 159 lb (72.1 kg)  11/24/16 161 lb (73 kg)  11/13/16 167 lb 1.6 oz (75.8 kg)  10/29/16 169 lb (76.7 kg)  09/18/16 172 lb (78 kg)   Ideal Body Weight:  70.55 kg(Adjusted for BKA)  BMI:  ADJUSTED Body mass index is 18.8 kg/m.  Estimated Nutritional Needs:  Kcal:  1950-2200 (35-40 kcal/kg bw) Protein:  85-100g Pro (1.5-1.8 g/kg bw) Fluid:  Per MD goals  Burtis Junes RD, LDN, CNSC Clinical Nutrition Pager: [phone] 05/26/2017 7:14 PM

## 2017-05-26 NOTE — Progress Notes (Signed)
[facility] Rounding Note  Subjective/Interval History:  Says he feels stronger Making urine IVF at 125  Objective Vital signs in last 24 hours: Vitals:   05/25/17 1603 05/25/17 2214 05/26/17 0500 05/26/17 0517  BP: (!) 147/76 (!) 142/74  (!) 120/58  Pulse: 86 76  72  Resp: '19 16  17  ' Temp: 98 F (36.7 C) 98.9 F (37.2 C)  98.5 F (36.9 C)  TempSrc: Oral Oral    SpO2: 100% 98%  100%  Weight:   55.7 kg (122 lb 12.7 oz)   Height:       Weight change:   Intake/Output Summary (Last 24 hours) at 05/26/2017 0733 Last data filed at 05/26/2017 0500 Gross per 24 hour  Intake 1200 ml  Output 600 ml  Net 600 ml   Physical Exam:  Blood pressure (!) 120/58, pulse 72, temperature 98.5 F (36.9 C), resp. rate 17, height '5\' 10"'  (1.778 m), weight 55.7 kg (122 lb 12.7 oz), SpO2 100 %.  He still looks desiccated on exam but clearly lesslethargic Wife in the room - endorses that he looks better Poor tissue turgor, skin turgor, muscle turgor Neck veins still not visible in the supine position Lungs clear S1S2 No S3 Abd scaphoid, no focal tenderness No edema LE's Awakens easily, oriented X3 NO asterixis   Recent Labs  Lab 05/25/17 1127 05/25/17 1226 05/26/17 0428  NA 146* 145 144  K 5.0 5.4* 4.3  CL 113* 117* 112*  CO2 20*  --  21*  GLUCOSE 140* 143* 108*  BUN 126* 109* 102*  CREATININE 3.54* 3.70* 3.19*  CALCIUM 9.6  --  8.8*  PHOS  --   --  4.3    Recent Labs  Lab 05/25/17 1127 05/26/17 0428  AST 27  --   ALT 17  --   ALKPHOS 121  --   BILITOT 0.6  --   PROT 6.7  --   ALBUMIN 3.1* 2.5*    Recent Labs  Lab 05/25/17 1226 05/25/17 1819 05/26/17 0428  WBC  --  9.6 8.7  HGB 8.5* 10.9* 9.6*  HCT 25.0* 35.7* 30.9*  MCV  --  93.5 92.2  PLT  --  198 205    Recent Labs  Lab 05/25/17 1322 05/25/17 1626 05/25/17 2210  GLUCAP 109* 152* 176*   Iron/TIBC/Ferritin/ %Sat    Component Value Date/Time   IRON 52 05/26/2017 0428   TIBC 102 (L) 05/26/2017 0428   FERRITIN  216 05/02/2017 1130   IRONPCTSAT 51 (H) 05/26/2017 0428   Studies/Results: Dg Chest Port 1 View  Result Date: 05/25/2017 CLINICAL DATA:  Weakness and shortness of breath. History of CHF, diabetes, hypertension, pneumonia, stroke, seizures, kidney disease and sepsis EXAM: PORTABLE CHEST 1 VIEW COMPARISON:  Chest x-rays dated 05/11/2017 and 07/02/2015 FINDINGS: Heart size and mediastinal contours are normal. Subtle nodular density at the right lung base, possibly nipple shadow. Lungs otherwise clear. No pleural effusion or pneumothorax. Osseous structures about the chest are unremarkable. IMPRESSION: 1. Subtle small nodular density at the right lung base. This could represent a nipple shadow or pulmonary nodule. Recommend either repeat chest x-ray with nipple markers in place or chest CT for definitive characterization. 2. No acute findings.  No evidence of pneumonia or pulmonary edema. Electronically Signed   By: Franki Cabot M.D.   On: 05/25/2017 11:55   Medications: . sodium chloride 0.45 % 1,000 mL infusion 125 mL/hr at 05/26/17 0618   . atorvastatin  20 mg Oral QPM  .  azithromycin  250 mg Oral Daily  . carvedilol  6.25 mg Oral BID WC  . clopidogrel  75 mg Oral Daily  . docusate sodium  100 mg Oral BID  . enoxaparin (LOVENOX) injection  30 mg Subcutaneous Q24H  . gabapentin  400 mg Oral QHS  . insulin aspart  0-5 Units Subcutaneous QHS  . insulin aspart  0-9 Units Subcutaneous TID WC  . insulin detemir  9 Units Subcutaneous QHS  . isosorbide-hydrALAZINE  1 tablet Oral BID  . lamoTRIgine  100 mg Oral BID  . latanoprost  1 drop Both Eyes QHS  . memantine  10 mg Oral BID  . multivitamin with minerals  1 tablet Oral Daily  . tamsulosin  0.4 mg Oral Daily  . timolol  1 drop Both Eyes BID  . venlafaxine XR  75 mg Oral Daily  . vitamin C  500 mg Oral Daily   Background: 72 y.o. year-old well known to me w/CKD 3/4 at baseline, DM, HTN, PAD, chronic systolic and diastolic HF, sx disorder,  h/o stroke. Has had an issue with eating for at least the past year (NOT related to renal failure until recently) dislikes food without salt and as a result does not eat. Wife puts food out but doesn't push him to eat. Weight decline documented in our office over the past year INDEPENDENT of his GFR which until recently has been around 25-30 in the office.  I last saw 04/03/17 with creatinine of 2.45 eGFR 30 BUN 52 and a 30 lb weight loss over course of 2018.  Was hosp 2/22-2/25 w/acute encephalopathy, RLE infection that required debridement and AKI on CKD with peak creatinine 3.5, down to 2.75 at discharge. Poor po since that admission. Seen again at [facility]  05/23/17 with c/o poor appetite as opposed to usual c/o of not liking the food), BP was in the 90's, weight was down to 142. Lasix was stopped, labs done BUN and creatinine 124/3.83   Brought by EMS from home 3/8 with weakness, creatinine 3.54, BUN 126. Asked to see.  Assessment/Recommendations  1. Recurrent AKI on CKD4 - markedly azotemic and may be uremic now (but his major weight loss over course of 2018 was NOT related to GFR which in the office was largely right around 25 - 30 until this last office visit). In addition has had at least 2 AKI's this past month, progressively azotemic. He looks absolutely desiccated on physical exam.  1. Recommending IVF hydration as initial Rx and if insufficient improvement in renal function then may have to proceed to commiting him to HD. (has h/o failed AVF, deemed candidate for AVG only).  2. Changed IVF to 1/2 NS (mildly hypernatremic) and ^'d rate last PM.  3. Labs improving.  4. Continue strict I and O. Daily weights. 2. Hyperkalemia - mild. Prior history. Low renin low aldo 2/2 DM. No K sparing drugs.  3. Anemia - On aranesp since 05/2016. Last dosed 05/08/17. TSat 51. Dose today (100 mcg). 4. DM - per primary team 5. H/o stroke - carotid siphon ds. Neuro has prev recommended maintaining systolic BP  588-325 6. R foot ulcer - debrided in February 7. Dementia - memantine 8. Sz disorder well controlled 9. BPH - w/elevated PSA. Had been scheduled for prostate Bx for late January no CA per pt  Jamal Maes, MD [facility] Pager 05/26/2017, 7:39 AM

## 2017-05-26 NOTE — Progress Notes (Addendum)
PROGRESS NOTE  [name]  ZOX:[id number] DOB: 1945-05-28 DOA: 05/25/2017 PCP: Deland Pretty, MD  Brief Narrative:   The patient is a 72 year old male with history of combined systolic and diastolic heart failure, chronic kidney disease stage V, seizure disorder, history of stroke with no residual deficits, diabetes mellitus type 2, chronic anemia who was recently admitted for confusion and had debridement of a right foot ulcer.  Over the last few days he has had decreased appetite.  Since November 2018 he has had a 25-30 pound weight loss which he and his wife attribute partly to his inability to swallow food normally.  He becomes tired after chewing.  He presented to the emergency department with weakness and was found to be uremic.   Assessment & Plan:  Acute on chronic kidney disease stage IV with uremic symptoms.  BUN and creatinine trending down -  Continue renal diet -  Continue IV fluids -  Appreciate nephrology assistance -  Due to see Dr. Trula Slade early next week for evaluation for possible placement of a dialysis catheter and possible AV graft as he has failed aVF. -Holding Lasix  Hyperkalemia, resolved with Kayexalate -Continue renal diet  Weight loss - Nutrition consultation  Dysphagia -  speech therapy assistance appreciated -Modified barium swallow completed, report pending  Uncontrolled type 2 diabetes mellitus with hypoglycemia and diabetic neuropathy -Hemoglobin A1c 8 on 05/04/2017 -  D/c levemir -Continue low-dose sliding scale insulin -Continue renally dose gabapentin  History of CVA -Continue Plavix, aspirin, statin  Weakness: Likely related to his uremia I suspect his poor p.o. intake is also related to the uremia. -PT evaluation  Right foot ulcer: That is post recent surgical debridement continue dressing changes. -Continue azithromycin -Follow up with Dr. due to  Chronic diastolic heart failure, appears hypovolemic -Continue IV fluids as  above  Hypertension: Blood pressure improved, continue BiDil, carvedilol  Dementia, stable, continue Namenda  Glaucoma, stable, continue latanoprost  Depression/depressed mood, denies suicidal ideation.  Patient and wife are requesting to speak with a psychiatrist or counselor regarding his symptoms -Psychiatry consultation, assistance appreciated -Continue Effexor  Seizure disorder, stable/seizure-free, continue Lamictal   DVT prophylaxis: Lovenox Code Status: Full code Family Communication: Patient's wife who is present at bedside Disposition Plan: Awaiting improvement in kidney function.  Awaiting results of modified barium swallow.  PT to evaluate patient.  He has a wound in the right foot and has an amputation of the left leg and is therefore wheelchair bound at baseline.   Consultants:   Nephrology  Procedures:  None  Antimicrobials:  Anti-infectives (From admission, onward)   Start     Dose/Rate Route Frequency Ordered Stop   05/25/17 1630  azithromycin (ZITHROMAX) tablet 250 mg     250 mg Oral Daily 05/25/17 1503         Subjective: Patient states that he overall feels better.  He feels that his strength has improved.  He had a good appetite this morning and ate all of his breakfast.  He denies fevers, chills.  He does not have much of an appetite but denies nausea.  His thinking has become somewhat clear today.  He feels depressed.  He denies suicidal ideation.  Denies shortness of breath  Objective: Vitals:   05/25/17 2214 05/26/17 0500 05/26/17 0517 05/26/17 1351  BP: (!) 142/74  (!) 120/58 128/85  Pulse: 76  72 89  Resp: 16  17 18   Temp: 98.9 F (37.2 C)  98.5 F (36.9 C) 98.5  F (36.9 C)  TempSrc: Oral   Oral  SpO2: 98%  100% 100%  Weight:  55.7 kg (122 lb 12.7 oz)    Height:        Intake/Output Summary (Last 24 hours) at 05/26/2017 1716 Last data filed at 05/26/2017 1524 Gross per 24 hour  Intake 1950 ml  Output 1100 ml  Net 850 ml   Filed  Weights   05/25/17 1122 05/26/17 0500  Weight: 55.3 kg (122 lb) 55.7 kg (122 lb 12.7 oz)    Examination:  General exam:  Adult male, temporal wasting,.  No acute distress.  HEENT:  NCAT, MMM Respiratory system: Clear to auscultation bilaterally Cardiovascular system: Regular rate and rhythm, normal S1/S2. No murmurs, rubs, gallops or clicks.  Warm extremities Gastrointestinal system: Normal active bowel sounds, soft, nondistended, nontender. MSK:  decreased tone and bulk, right lower extremity with Ace bandage intact.  I did not remove this today for examination.  Left BKA.  No edema on stump.  Denies shortness of breath or wheeze. Neuro:  Grossly intact    Data Reviewed: I have personally reviewed following labs and imaging studies  CBC: Recent Labs  Lab 05/25/17 1226 05/25/17 1819 05/26/17 0428  WBC  --  9.6 8.7  HGB 8.5* 10.9* 9.6*  HCT 25.0* 35.7* 30.9*  MCV  --  93.5 92.2  PLT  --  198 938   Basic Metabolic Panel: Recent Labs  Lab 05/25/17 1127 05/25/17 1226 05/26/17 0428  NA 146* 145 144  K 5.0 5.4* 4.3  CL 113* 117* 112*  CO2 20*  --  21*  GLUCOSE 140* 143* 108*  BUN 126* 109* 102*  CREATININE 3.54* 3.70* 3.19*  CALCIUM 9.6  --  8.8*  PHOS  --   --  4.3   GFR: Estimated Creatinine Clearance: 17 mL/min (A) (by C-G formula based on SCr of 3.19 mg/dL (H)). Liver Function Tests: Recent Labs  Lab 05/25/17 1127 05/26/17 0428  AST 27  --   ALT 17  --   ALKPHOS 121  --   BILITOT 0.6  --   PROT 6.7  --   ALBUMIN 3.1* 2.5*   No results for input(s): LIPASE, AMYLASE in the last 168 hours. No results for input(s): AMMONIA in the last 168 hours. Coagulation Profile: No results for input(s): INR, PROTIME in the last 168 hours. Cardiac Enzymes: No results for input(s): CKTOTAL, CKMB, CKMBINDEX, TROPONINI in the last 168 hours. BNP (last 3 results) No results for input(s): PROBNP in the last 8760 hours. HbA1C: No results for input(s): HGBA1C in the last 72  hours. CBG: Recent Labs  Lab 05/25/17 1626 05/25/17 2210 05/26/17 0743 05/26/17 1127 05/26/17 1624  GLUCAP 152* 176* 67 169* 166*   Lipid Profile: No results for input(s): CHOL, HDL, LDLCALC, TRIG, CHOLHDL, LDLDIRECT in the last 72 hours. Thyroid Function Tests: No results for input(s): TSH, T4TOTAL, FREET4, T3FREE, THYROIDAB in the last 72 hours. Anemia Panel: Recent Labs    05/26/17 0428  TIBC 102*  IRON 52   Urine analysis:    Component Value Date/Time   COLORURINE YELLOW 05/25/2017 1127   APPEARANCEUR CLEAR 05/25/2017 1127   LABSPEC 1.013 05/25/2017 1127   PHURINE 5.0 05/25/2017 1127   GLUCOSEU NEGATIVE 05/25/2017 1127   HGBUR NEGATIVE 05/25/2017 1127   BILIRUBINUR NEGATIVE 05/25/2017 1127   KETONESUR NEGATIVE 05/25/2017 1127   PROTEINUR 30 (A) 05/25/2017 1127   UROBILINOGEN 0.2 12/07/2012 1140   NITRITE NEGATIVE 05/25/2017 1127  LEUKOCYTESUR NEGATIVE 05/25/2017 1127   Sepsis Labs: @LABRCNTIP (procalcitonin:4,lacticidven:4)  )No results found for this or any previous visit (from the past 240 hour(s)).    Radiology Studies: Dg Chest Port 1 View  Result Date: 05/25/2017 CLINICAL DATA:  Weakness and shortness of breath. History of CHF, diabetes, hypertension, pneumonia, stroke, seizures, kidney disease and sepsis EXAM: PORTABLE CHEST 1 VIEW COMPARISON:  Chest x-rays dated 05/11/2017 and 07/02/2015 FINDINGS: Heart size and mediastinal contours are normal. Subtle nodular density at the right lung base, possibly nipple shadow. Lungs otherwise clear. No pleural effusion or pneumothorax. Osseous structures about the chest are unremarkable. IMPRESSION: 1. Subtle small nodular density at the right lung base. This could represent a nipple shadow or pulmonary nodule. Recommend either repeat chest x-ray with nipple markers in place or chest CT for definitive characterization. 2. No acute findings.  No evidence of pneumonia or pulmonary edema. Electronically Signed   By: Franki Cabot M.D.   On: 05/25/2017 11:55     Scheduled Meds: . atorvastatin  20 mg Oral QPM  . azithromycin  250 mg Oral Daily  . carvedilol  6.25 mg Oral BID WC  . clopidogrel  75 mg Oral Daily  . darbepoetin (ARANESP) injection - NON-DIALYSIS  100 mcg Subcutaneous Q Sat-1800  . docusate sodium  100 mg Oral BID  . enoxaparin (LOVENOX) injection  30 mg Subcutaneous Q24H  . gabapentin  400 mg Oral QHS  . insulin aspart  0-5 Units Subcutaneous QHS  . insulin aspart  0-9 Units Subcutaneous TID WC  . isosorbide-hydrALAZINE  1 tablet Oral BID  . lamoTRIgine  100 mg Oral BID  . latanoprost  1 drop Both Eyes QHS  . memantine  10 mg Oral BID  . multivitamin with minerals  1 tablet Oral Daily  . tamsulosin  0.4 mg Oral Daily  . timolol  1 drop Both Eyes BID  . venlafaxine XR  75 mg Oral Daily  . vitamin C  500 mg Oral Daily   Continuous Infusions: . sodium chloride 0.45 % 1,000 mL infusion 125 mL/hr at 05/26/17 0618     LOS: 1 day    Time spent: 30 min    Janece Canterbury, MD [facility] Pager [phone]  If 7PM-7AM, please contact night-coverage [email or web address] Password [facility] 05/26/2017, 5:16 PM
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
## 2017-05-27 DIAGNOSIS — E43 Unspecified severe protein-calorie malnutrition: Secondary | ICD-10-CM

## 2017-05-27 LAB — GLUCOSE, CAPILLARY
GLUCOSE-CAPILLARY: 83 mg/dL (ref 65–99)
GLUCOSE-CAPILLARY: 193 mg/dL — AB (ref 65–99)
Glucose-Capillary: 190 mg/dL — ABNORMAL HIGH (ref 65–99)
Glucose-Capillary: 236 mg/dL — ABNORMAL HIGH (ref 65–99)

## 2017-05-27 LAB — RENAL FUNCTION PANEL
ANION GAP: 8 (ref 5–15)
Albumin: 2.2 g/dL — ABNORMAL LOW (ref 3.5–5.0)
BUN: 77 mg/dL — ABNORMAL HIGH (ref 6–20)
CO2: 20 mmol/L — AB (ref 22–32)
Calcium: 8.2 mg/dL — ABNORMAL LOW (ref 8.9–10.3)
Chloride: 110 mmol/L (ref 101–111)
Creatinine, Ser: 2.65 mg/dL — ABNORMAL HIGH (ref 0.61–1.24)
GFR calc non Af Amer: 23 mL/min — ABNORMAL LOW (ref >60)
GFR, EST AFRICAN AMERICAN: 26 mL/min — AB (ref >60)
GLUCOSE: 63 mg/dL — AB (ref 65–99)
POTASSIUM: 4 mmol/L (ref 3.5–5.1)
Phosphorus: 3.5 mg/dL (ref 2.5–4.6)
Sodium: 138 mmol/L (ref 135–145)

## 2017-05-27 MED ORDER — BISACODYL 10 MG RE SUPP
10.0000 mg | Freq: Once | RECTAL | Status: AC
  Administered 2017-05-27: 10 mg via RECTAL
  Filled 2017-05-27: qty 1

## 2017-05-27 MED ORDER — INSULIN ASPART 100 UNIT/ML ~~LOC~~ SOLN
0.0000 [IU] | Freq: Three times a day (TID) | SUBCUTANEOUS | Status: DC
  Administered 2017-05-27 (×2): 1 [IU] via SUBCUTANEOUS
  Administered 2017-05-28: 2 [IU] via SUBCUTANEOUS

## 2017-05-27 NOTE — Progress Notes (Signed)
Took off dressing right foot.  Dr. Sheran Fava was able to see it as well. The inner ankle area has a sutured incision that is 5 cm long. Pt states that he had surgery on that area to take out an ulcer. Scant amount of old serous drainage noted on the old dressing which did not have an odor.  Placed petroleum gauze over incision site so that it would be non-adherent, 4x4s, kerlix and then wrapped an ace wrap over foot and lower leg. Ordered a Prevalon boot for pt so there will be no pressure on that foot, pt can also take it home and use it.

## 2017-05-27 NOTE — Progress Notes (Signed)
Pt ate 100% of his breakfast this am.

## 2017-05-27 NOTE — Evaluation (Signed)
Physical Therapy Evaluation Patient Details Name: [name] MRN: [id number] DOB: 08-02-1945 Today's Date: 05/27/2017   History of Present Illness  [name] a 72 y.o.malewith medical history significant oftonic combined congestive heart failure, chronic kidney disease stage V, seizure disorder, previous history of stroke with no residual deficits, diabetes type 2, chronic anemia, he was recently admitted for confusion and had surgery for right foot ulcer debridement. Admitted for profound weakness and uremia.  Clinical Impression  Pt functioning near baseline. Pt requires L prosthesis to safely attempt standing. Spouse reports she can bring it in tomorrow. Pt minA for bed mobility and ant/posterior transfer. Pt able to use bilat UEs to safely transfer in/out of chair. Acute PT to con't to follow.    Follow Up Recommendations Home health PT;Supervision/Assistance - 24 hour(continue with [facility])    Equipment Recommendations  None recommended by PT    Recommendations for Other Services       Precautions / Restrictions Precautions Precautions: Other (comment) Precaution Comments: L BKA, R foot with recent surgery Restrictions Weight Bearing Restrictions: Yes RLE Weight Bearing: Non weight bearing      Mobility  Bed Mobility Overal bed mobility: Needs Assistance Bed Mobility: Supine to Sit     Supine to sit: Supervision     General bed mobility comments: HOB elevated, v/c's to bring self to long sit to prep for ant/post transfer  Transfers Overall transfer level: Needs assistance Equipment used: None Transfers: Comptroller transfers: Min assist   General transfer comment: max directional v/c's, minA to manage R LE to maintain R LE NWB due to open incision on R foot. pt able to elevated self with bilat UEs and scoot self back  Ambulation/Gait                Stairs            Wheelchair  Mobility    Modified Rankin (Stroke Patients Only)       Balance Overall balance assessment: Needs assistance Sitting-balance support: Feet supported;Bilateral upper extremity supported Sitting balance-Leahy Scale: Fair         Standing balance comment: unable to stand at this time due to R LE NWB and no L LE prosthesis                             Pertinent Vitals/Pain Pain Assessment: No/denies pain    Home Living Family/patient expects to be discharged to:: Private residence Living Arrangements: Spouse/significant other Available Help at Discharge: Available 24 hours/day;Family Type of Home: House Home Access: Ramped entrance     Home Layout: One level Home Equipment: Environmental consultant - 2 wheels;Cane - single point;Wheelchair - manual;Bedside commode      Prior Function Level of Independence: Needs assistance   Gait / Transfers Assistance Needed: used RW with prsothesis until 1 month ago when he had R foot surgery has been using W/C  ADL's / Homemaking Assistance Needed: pt uses BSC and sponge bathes. Pt can bathe and toliet self with set up        Hand Dominance   Dominant Hand: Right    Extremity/Trunk Assessment   Upper Extremity Assessment Upper Extremity Assessment: Generalized weakness    Lower Extremity Assessment Lower Extremity Assessment: RLE deficits/detail;LLE deficits/detail RLE Deficits / Details: R digit amputation, grossly 3+/5 based on exercises, unable to WB due to surgery RLE Sensation: history of peripheral  neuropathy LLE Deficits / Details: L BKA, able to complete quad sets and hip flexion LLE Sensation: history of peripheral neuropathy    Cervical / Trunk Assessment Cervical / Trunk Assessment: Normal  Communication   Communication: No difficulties  Cognition Arousal/Alertness: Awake/alert Behavior During Therapy: Flat affect Overall Cognitive Status: Impaired/Different from baseline Area of Impairment:  Memory;Safety/judgement;Awareness;Problem solving                     Memory: Decreased short-term memory   Safety/Judgement: Decreased awareness of safety;Decreased awareness of deficits Awareness: Emergent(reports he has to have a BM) Problem Solving: Slow processing;Decreased initiation;Difficulty sequencing;Requires verbal cues;Requires tactile cues General Comments: requires max directional v/c's for all tasks      General Comments      Exercises General Exercises - Lower Extremity Quad Sets: AROM;Both;10 reps;Seated Gluteal Sets: AROM;Both;10 reps;Seated Straight Leg Raises: AROM;Both;10 reps;Seated   Assessment/Plan    PT Assessment Patient needs continued PT services  PT Problem List Decreased strength;Decreased range of motion;Decreased activity tolerance;Decreased balance;Decreased mobility;Decreased coordination;Decreased cognition;Decreased knowledge of use of DME;Decreased safety awareness       PT Treatment Interventions DME instruction;Gait training;Stair training;Functional mobility training;Therapeutic activities;Therapeutic exercise;Balance training;Cognitive remediation    PT Goals (Current goals can be found in the Care Plan section)  Acute Rehab PT Goals Patient Stated Goal: didn't state PT Goal Formulation: With patient/family Time For Goal Achievement: 06/10/17 Potential to Achieve Goals: Good    Frequency Min 3X/week   Barriers to discharge        Co-evaluation               AM-PAC PT "6 Clicks" Daily Activity  Outcome Measure Difficulty turning over in bed (including adjusting bedclothes, sheets and blankets)?: A Little Difficulty moving from lying on back to sitting on the side of the bed? : A Little Difficulty sitting down on and standing up from a chair with arms (e.g., wheelchair, bedside commode, etc,.)?: Unable Help needed moving to and from a bed to chair (including a wheelchair)?: Total Help needed walking in hospital  room?: Total Help needed climbing 3-5 steps with a railing? : Total 6 Click Score: 10    End of Session   Activity Tolerance: Patient tolerated treatment well Patient left: in chair;with call bell/phone within reach;with family/visitor present Nurse Communication: Mobility status(how to complete ant/post transfer) PT Visit Diagnosis: Unsteadiness on feet (R26.81)    Time: 1125-1150 PT Time Calculation (min) (ACUTE ONLY): 25 min   Charges:   PT Evaluation $PT Eval Moderate Complexity: 1 Mod PT Treatments $Therapeutic Exercise: 8-22 mins   PT G Codes:        Kittie Plater, PT, DPT Pager #: [phone] Office #: [phone]   Felix Meras M Amora Sheehy 05/27/2017, 12:44 PM

## 2017-05-27 NOTE — Progress Notes (Signed)
[facility] Rounding Note  Background: 72 y.o. year-old well known to me w/CKD 3/4 at baseline, DM, HTN, PAD, chronic systolic and diastolic HF, sx disorder, h/o stroke. Issue with eating for at least the past year (NOT related to renal failure until recently) dislikes food without salt so does not eat. Weight decline documented in our office over the past year INDEPENDENT of his GFR which until recently has been around 25-30 in the office.  I last saw 04/03/17 with creatinine of 2.45 eGFR 30 BUN 52 and a 30 lb weight loss over course of 2018.  Interim hosp 2/22-2/25 w/acute encephalopathy, RLE infection that required debridement and AKI on CKD with peak creatinine 3.5, down to 2.75 at discharge. Poor po since that admission. Seen again at [facility]  05/23/17 with c/o poor appetite as opposed to usual c/o of not liking the food, BP was in the 90's, weight was down to 142. Lasix was stopped, labs done BUN and creatinine 124/3.83   Brought by EMS from home 3/8 with ^ weakness, creatinine 3.54, BUN 126. Asked to see.  Assessment/Recommendations  1. Recurrent AKI on CKD4 - markedly azotemic (and possibly WAS uremic on admission) but his major weight loss over course of 2018 was NOT related to GFR which in the office was largely right around 25 - 30 until this last office visit. In addition at least 2 AKI's this past month accounting for worsening renal fx, azotemia and poor po. He looked absolutely desiccated on admission physical exam.  1. Marked improvement in exam and labs with IVF hydration. Nearing previous baseline GFR, making plenty of urine. I do NOT recommend proceeding with HD catheter at this time. I think need to see if po improves with full correction of volume depletion related azotemia (number are better, ate 100% of is bfast today!!). 2. Additionally, until recent sig azotemia, his poor food intake has NOT been related to no appetite as much as refusal to eat. Need to clarify his swallowing issues as  well. 3. Continue IV 1/2 NS for now, lower rate to 75, see if can keep up on his own. 4. Check labs AM 5. No lasix at discharge 2. Hyperkalemia - Resolved. Prior history. Low renin low aldo 2/2 DM. Avoid all  K sparing drugs.  3. Anemia - On aranesp since 05/2016. Last dosed 05/08/17. TSat 51. Dosed 3/9 (100 mcg). 4. DM - per primary team 5. H/o stroke - carotid siphon ds. Neuro has prev recommended maintaining systolic BP 161-096 6. R foot ulcer - debrided in February. Dressing changed. Prevalon boot applied. 7. Dementia - memantine 8. Sz disorder well controlled 9. BPH - w/elevated PSA. Had been scheduled for prostate Bx for late January no CA per pt   Has appt to see me in the office 3/21 at 2:30. Once discharged I will have the office call with a date for repeat labs. Cancelling [facility]VS appt for now. Wife aware.  Jamal Maes, MD [facility] [phone] Pager 05/27/2017, 11:54 AM  Subjective/Interval History:   Ate all his breakfast today! DOES have appetite Trying to have BM Up in the chair  Objective Vital signs in last 24 hours: Vitals:   05/26/17 0517 05/26/17 1351 05/26/17 2130 05/27/17 0500  BP: (!) 120/58 128/85 (!) 111/51 (!) 152/57  Pulse: 72 89 75 64  Resp: '17 18 17 17  ' Temp: 98.5 F (36.9 C) 98.5 F (36.9 C) 98.5 F (36.9 C) 97.9 F (36.6 C)  TempSrc:  Oral Oral Oral  SpO2: 100% 100% 100% 100%  Weight:    57 kg (125 lb 10.6 oz)  Height:       Weight change: 1.661 kg (3 lb 10.6 oz)  Intake/Output Summary (Last 24 hours) at 05/27/2017 1154 Last data filed at 05/27/2017 0917 Gross per 24 hour  Intake 3515 ml  Output 1302 ml  Net 2213 ml   Physical Exam:  Blood pressure (!) 152/57, pulse 64, temperature 97.9 F (36.6 C), temperature source Oral, resp. rate 17, height '5\' 10"'  (1.778 m), weight 57 kg (125 lb 10.6 oz), SpO2 100 %.   Wife in the room - endorses that he looks better Improvement in skin turgor No JVD Lungs remain entirely  clear S1S2 No S3 Abd scaphoid, no focal tenderness No edema LE's Oriented X3 NO asterixis On bedpan in chair   Recent Labs  Lab 05/25/17 1127 05/25/17 1226 05/26/17 0428 05/27/17 0510  NA 146* 145 144 138  K 5.0 5.4* 4.3 4.0  CL 113* 117* 112* 110  CO2 20*  --  21* 20*  GLUCOSE 140* 143* 108* 63*  BUN 126* 109* 102* 77*  CREATININE 3.54* 3.70* 3.19* 2.65*  CALCIUM 9.6  --  8.8* 8.2*  PHOS  --   --  4.3 3.5    Recent Labs  Lab 05/25/17 1127 05/26/17 0428 05/27/17 0510  AST 27  --   --   ALT 17  --   --   ALKPHOS 121  --   --   BILITOT 0.6  --   --   PROT 6.7  --   --   ALBUMIN 3.1* 2.5* 2.2*    Recent Labs  Lab 05/25/17 1226 05/25/17 1819 05/26/17 0428  WBC  --  9.6 8.7  HGB 8.5* 10.9* 9.6*  HCT 25.0* 35.7* 30.9*  MCV  --  93.5 92.2  PLT  --  198 205    Recent Labs  Lab 05/26/17 0743 05/26/17 1127 05/26/17 1624 05/26/17 2126 05/27/17 0739  GLUCAP 67 169* 166* 362* 83      Component Value Date/Time   IRON 52 05/26/2017 0428   TIBC 102 (L) 05/26/2017 0428   FERRITIN 216 05/02/2017 1130   IRONPCTSAT 51 (H) 05/26/2017 0428   Medications: . sodium chloride 0.45 % 1,000 mL infusion 125 mL/hr at 05/26/17 2233   . atorvastatin  20 mg Oral QPM  . azithromycin  250 mg Oral Daily  . carvedilol  6.25 mg Oral BID WC  . clopidogrel  75 mg Oral Daily  . darbepoetin (ARANESP) injection - NON-DIALYSIS  100 mcg Subcutaneous Q Sat-1800  . docusate sodium  100 mg Oral BID  . enoxaparin (LOVENOX) injection  30 mg Subcutaneous Q24H  . feeding supplement (NEPRO CARB STEADY)  237 mL Oral BID BM  . feeding supplement (PRO-STAT SUGAR FREE 64)  30 mL Oral BID  . gabapentin  400 mg Oral QHS  . insulin aspart  0-5 Units Subcutaneous TID WC  . isosorbide-hydrALAZINE  1 tablet Oral BID  . lamoTRIgine  100 mg Oral BID  . latanoprost  1 drop Both Eyes QHS  . memantine  10 mg Oral BID  . multivitamin with minerals  1 tablet Oral Daily  . tamsulosin  0.4 mg Oral  Daily  . timolol  1 drop Both Eyes BID  . venlafaxine XR  75 mg Oral Daily  . vitamin C  500 mg Oral Daily

## 2017-05-27 NOTE — Progress Notes (Signed)
PROGRESS NOTE  [name]  TDH:[id number] DOB: Sep 17, 1945 DOA: 05/25/2017 PCP: Deland Pretty, MD  Brief Narrative:   The patient is a 72 year old male with history of combined systolic and diastolic heart failure, chronic kidney disease stage V, seizure disorder, history of stroke with no residual deficits, diabetes mellitus type 2, chronic anemia who was recently admitted for confusion and had debridement of a right foot ulcer.  Over the last few days he has had decreased appetite.  Since November 2018 he has had a 25-30 pound weight loss which he and his wife attribute partly to his inability to swallow food normally.  He becomes tired after chewing.  He presented to the emergency department with weakness and dehydration with AKI.  Renal function has continued to improve.    Assessment & Plan:  Acute on chronic kidney disease stage IV with uremic symptoms.  BUN and creatinine trending down -  Continue renal diet -  Continue IV fluids, rate reduced -  Appreciate nephrology assistance -  Holding Lasix  Hyperkalemia, resolved with Kayexalate  Severe protein calorie malnutrition - Nutrition assistance appreciated -Regular diet -Supplements ordered  Dysphagia -  Speech therapy assistance appreciated -  Modified barium swallow completed: Appears to have no problems with thin liquids but may have some problems with solids  Uncontrolled type 2 diabetes mellitus with hypoglycemia and diabetic neuropathy -Hemoglobin A1c 8 on 05/04/2017 -decrease low-dose sliding scale insulin -Continue renally dose gabapentin  History of CVA -Continue Plavix, aspirin, statin  Weakness: Likely related to his uremia I suspect his poor p.o. intake is also related to the uremia. -PT recommending HH  Right foot ulcer: That is post recent surgical debridement continue dressing changes.   -Continue azithromycin -Follow up with Dr. [name]  Chronic diastolic heart failure, appears more euvolemic  today -Continue IV fluids as above  Hypertension: Blood pressure improved, continue BiDil, carvedilol  Dementia, stable, continue Namenda  Glaucoma, stable, continue latanoprost  Depression/depressed mood, denies suicidal ideation.   -Psychiatry consult appreciated -Continue Effexor  Seizure disorder, stable/seizure-free, continue Lamictal   DVT prophylaxis: Lovenox Code Status: Full code Family Communication: Patient's wife by phone Disposition Plan:  Repeat BMP in AM, monitoring oral intake.  Anticipate [facility] tomorrow  Consultants:   Nephrology  Procedures:  None  Antimicrobials:  Anti-infectives (From admission, onward)   Start     Dose/Rate Route Frequency Ordered Stop   05/25/17 1630  azithromycin (ZITHROMAX) tablet 250 mg     250 mg Oral Daily 05/25/17 1503         Subjective:  Has a feeling of dj vu.  He asked if we have met before.  He denies pain, shortness of breath, abdominal discomfort or nausea.  He states he was hungry this morning and he ate his entire breakfast.  He states he cannot remember what he had for lunch yesterday and thinks that he did not get a dinner tray.  The nurse reports he is currently on a calorie count at the recommendation of nutrition.  Objective: Vitals:   05/26/17 0517 05/26/17 1351 05/26/17 2130 05/27/17 0500  BP: (!) 120/58 128/85 (!) 111/51 (!) 152/57  Pulse: 72 89 75 64  Resp: '17 18 17 17  ' Temp: 98.5 F (36.9 C) 98.5 F (36.9 C) 98.5 F (36.9 C) 97.9 F (36.6 C)  TempSrc:  Oral Oral Oral  SpO2: 100% 100% 100% 100%  Weight:    57 kg (125 lb 10.6 oz)  Height:  Intake/Output Summary (Last 24 hours) at 05/27/2017 1443 Last data filed at 05/27/2017 1400 Gross per 24 hour  Intake 3872 ml  Output 1602 ml  Net 2270 ml   Filed Weights   05/25/17 1122 05/26/17 0500 05/27/17 0500  Weight: 55.3 kg (122 lb) 55.7 kg (122 lb 12.7 oz) 57 kg (125 lb 10.6 oz)    Examination:  General exam:  Adult  male, temporal wasting/cachexia.  No acute distress.  HEENT:  NCAT, MMM Respiratory system: Clear to auscultation bilaterally Cardiovascular system: Regular rate and rhythm, normal S1/S2. No murmurs, rubs, gallops or clicks.  Warm extremities Gastrointestinal system: Normal active bowel sounds, soft, nondistended, nontender. MSK:  Normal tone and bulk, no lower extremity edema, left BKA.  Right foot examined today, see pictures below Neuro:  Diminished sensation on right foot.        Data Reviewed: I have personally reviewed following labs and imaging studies  CBC: Recent Labs  Lab 05/25/17 1226 05/25/17 1819 05/26/17 0428  WBC  --  9.6 8.7  HGB 8.5* 10.9* 9.6*  HCT 25.0* 35.7* 30.9*  MCV  --  93.5 92.2  PLT  --  198 237   Basic Metabolic Panel: Recent Labs  Lab 05/25/17 1127 05/25/17 1226 05/26/17 0428 05/27/17 0510  NA 146* 145 144 138  K 5.0 5.4* 4.3 4.0  CL 113* 117* 112* 110  CO2 20*  --  21* 20*  GLUCOSE 140* 143* 108* 63*  BUN 126* 109* 102* 77*  CREATININE 3.54* 3.70* 3.19* 2.65*  CALCIUM 9.6  --  8.8* 8.2*  PHOS  --   --  4.3 3.5   GFR: Estimated Creatinine Clearance: 20.9 mL/min (A) (by C-G formula based on SCr of 2.65 mg/dL (H)). Liver Function Tests: Recent Labs  Lab 05/25/17 1127 05/26/17 0428 05/27/17 0510  AST 27  --   --   ALT 17  --   --   ALKPHOS 121  --   --   BILITOT 0.6  --   --   PROT 6.7  --   --   ALBUMIN 3.1* 2.5* 2.2*   No results for input(s): LIPASE, AMYLASE in the last 168 hours. No results for input(s): AMMONIA in the last 168 hours. Coagulation Profile: No results for input(s): INR, PROTIME in the last 168 hours. Cardiac Enzymes: No results for input(s): CKTOTAL, CKMB, CKMBINDEX, TROPONINI in the last 168 hours. BNP (last 3 results) No results for input(s): PROBNP in the last 8760 hours. HbA1C: No results for input(s): HGBA1C in the last 72 hours. CBG: Recent Labs  Lab 05/26/17 1127 05/26/17 1624 05/26/17 2126  05/27/17 0739 05/27/17 1243  GLUCAP 169* 166* 362* 83 193*   Lipid Profile: No results for input(s): CHOL, HDL, LDLCALC, TRIG, CHOLHDL, LDLDIRECT in the last 72 hours. Thyroid Function Tests: No results for input(s): TSH, T4TOTAL, FREET4, T3FREE, THYROIDAB in the last 72 hours. Anemia Panel: Recent Labs    05/26/17 0428  TIBC 102*  IRON 52   Urine analysis:    Component Value Date/Time   COLORURINE YELLOW 05/25/2017 1127   APPEARANCEUR CLEAR 05/25/2017 1127   LABSPEC 1.013 05/25/2017 1127   PHURINE 5.0 05/25/2017 1127   GLUCOSEU NEGATIVE 05/25/2017 1127   HGBUR NEGATIVE 05/25/2017 1127   BILIRUBINUR NEGATIVE 05/25/2017 1127   KETONESUR NEGATIVE 05/25/2017 1127   PROTEINUR 30 (A) 05/25/2017 1127   UROBILINOGEN 0.2 12/07/2012 1140   NITRITE NEGATIVE 05/25/2017 1127   LEUKOCYTESUR NEGATIVE 05/25/2017 1127   Sepsis Labs: @  LABRCNTIP(procalcitonin:4,lacticidven:4)  )No results found for this or any previous visit (from the past 240 hour(s)).    Radiology Studies: No results found.   Scheduled Meds: . atorvastatin  20 mg Oral QPM  . azithromycin  250 mg Oral Daily  . carvedilol  6.25 mg Oral BID WC  . clopidogrel  75 mg Oral Daily  . darbepoetin (ARANESP) injection - NON-DIALYSIS  100 mcg Subcutaneous Q Sat-1800  . docusate sodium  100 mg Oral BID  . enoxaparin (LOVENOX) injection  30 mg Subcutaneous Q24H  . feeding supplement (NEPRO CARB STEADY)  237 mL Oral BID BM  . feeding supplement (PRO-STAT SUGAR FREE 64)  30 mL Oral BID  . gabapentin  400 mg Oral QHS  . insulin aspart  0-5 Units Subcutaneous TID WC  . isosorbide-hydrALAZINE  1 tablet Oral BID  . lamoTRIgine  100 mg Oral BID  . latanoprost  1 drop Both Eyes QHS  . memantine  10 mg Oral BID  . multivitamin with minerals  1 tablet Oral Daily  . tamsulosin  0.4 mg Oral Daily  . timolol  1 drop Both Eyes BID  . venlafaxine XR  75 mg Oral Daily  . vitamin C  500 mg Oral Daily   Continuous Infusions: .  sodium chloride 0.45 % 1,000 mL infusion 75 mL/hr at 05/27/17 1227     LOS: 2 days    Time spent: 30 min    Janece Canterbury, MD [facility] Pager [phone]  If 7PM-7AM, please contact night-coverage [email or web address] Password [facility] 05/27/2017, 2:43 PM

## 2017-05-28 ENCOUNTER — Encounter (HOSPITAL_COMMUNITY): Payer: Self-pay

## 2017-05-28 ENCOUNTER — Inpatient Hospital Stay (HOSPITAL_COMMUNITY): Admission: RE | Admit: 2017-05-28 | Payer: Medicare Other | Source: Ambulatory Visit

## 2017-05-28 ENCOUNTER — Ambulatory Visit: Payer: Medicare Other | Admitting: Surgery

## 2017-05-28 ENCOUNTER — Ambulatory Visit (HOSPITAL_COMMUNITY): Payer: Medicare Other

## 2017-05-28 DIAGNOSIS — N183 Chronic kidney disease, stage 3 (moderate): Secondary | ICD-10-CM

## 2017-05-28 LAB — RENAL FUNCTION PANEL
ANION GAP: 9 (ref 5–15)
Albumin: 2.1 g/dL — ABNORMAL LOW (ref 3.5–5.0)
BUN: 79 mg/dL — ABNORMAL HIGH (ref 6–20)
CALCIUM: 8.2 mg/dL — AB (ref 8.9–10.3)
CHLORIDE: 109 mmol/L (ref 101–111)
CO2: 19 mmol/L — AB (ref 22–32)
CREATININE: 2.77 mg/dL — AB (ref 0.61–1.24)
GFR, EST AFRICAN AMERICAN: 25 mL/min — AB (ref >60)
GFR, EST NON AFRICAN AMERICAN: 22 mL/min — AB (ref >60)
Glucose, Bld: 265 mg/dL — ABNORMAL HIGH (ref 65–99)
Phosphorus: 3.5 mg/dL (ref 2.5–4.6)
Potassium: 4.2 mmol/L (ref 3.5–5.1)
Sodium: 137 mmol/L (ref 135–145)

## 2017-05-28 LAB — GLUCOSE, CAPILLARY
GLUCOSE-CAPILLARY: 218 mg/dL — AB (ref 65–99)
Glucose-Capillary: 281 mg/dL — ABNORMAL HIGH (ref 65–99)
Glucose-Capillary: 328 mg/dL — ABNORMAL HIGH (ref 65–99)

## 2017-05-28 LAB — HEMOGLOBIN A1C
Hgb A1c MFr Bld: 7.3 % — ABNORMAL HIGH (ref 4.8–5.6)
Mean Plasma Glucose: 162.81 mg/dL

## 2017-05-28 MED ORDER — NEPRO/CARBSTEADY PO LIQD: 237.0000 mL | Freq: Two times a day (BID) | ORAL | 0 refills | Status: DC

## 2017-05-28 MED ORDER — INSULIN ASPART 100 UNIT/ML ~~LOC~~ SOLN
0.0000 [IU] | Freq: Three times a day (TID) | SUBCUTANEOUS | Status: DC
  Administered 2017-05-28: 7 [IU] via SUBCUTANEOUS

## 2017-05-28 MED ORDER — PRO-STAT SUGAR FREE PO LIQD: 30.0000 mL | Freq: Two times a day (BID) | ORAL | 0 refills | Status: DC

## 2017-05-28 NOTE — Care Management Note (Signed)
Case Management Note  Patient Details  Name: [name] MRN: [id number] Date of Birth: 1945-08-28  Subjective/Objective:                    Action/Plan: Patient states wife will provide transport home.   Expected Discharge Date:                  Expected Discharge Plan:  [place]  In-House Referral:     Discharge planning Services  CM Consult  Post Acute Care Choice:  Home Health Choice offered to:  Patient  DME Arranged:  N/A DME Agency:  NA  HH Arranged:  PT [place] Agency:  [facility] (formerly [employer])  Status of Service:  Completed, signed off  If discussed at [employer], dates discussed:    Additional Comments:  Devin Favre, RN 05/28/2017, 11:11 AM

## 2017-05-28 NOTE — Discharge Summary (Addendum)
Physician Discharge Summary  [name] ZWC:[id number] DOB: May 09, 1945 DOA: 05/25/2017  PCP: Deland Pretty, MD  Admit date: 05/25/2017 Discharge date: 05/28/2017  Admitted From: home  Disposition:  home  Recommendations for Outpatient Follow-up:  1. Follow up with Vanetta Mulders, Nephrology, on 3/21 at 2:30pm regarding CKD 2. Dr. [name] in 1-2 weeks regarding wound dehiscence 3. [facility] regarding difficulties swallowing 4. Endocrinology at already scheduled appointment in 2 weeks for DM   Home Health:  PT Equipment/Devices:  none  Discharge Condition:  Stable, improved CODE STATUS:  Full code  Diet recommendation:  regular   Brief/Interim Summary:  The patient is a 72 year old male with history of combined systolic and diastolic heart failure, chronic kidney disease stage V, seizure disorder, history of stroke with no residual deficits, diabetes mellitus type 2, chronic anemia who was recently admitted for confusion and had debridement of a right foot ulcer. Over the last few days he has had decreased appetite. Since November 2018 he has had a 25-30 pound weight loss which he and his wife attribute partly to his inability to swallow food normally. He becomes tired after chewing. He presented to the emergency department with weakness, dehydration and AKI.  He was started on IV fluids and his creatinine trended down from 3.7 to 2.77.  He was seen by nephrology who agreed that the patient's creatinine had improved enough that he could safely be discharged to follow-up with them as an outpatient in a few weeks.  The patient underwent a modified barium swallow was seen by speech therapy who recommended a regular diet with thin liquids.  He was a mild aspiration risk.  Additionally, he met with the nutritionist because of his weight loss.  He was found to have severe protein calorie malnutrition.  I have started him on a more liberal diet with Glucerna shakes and his oral intake has improved.   Think his dementia is affecting his ability to recollect what he has eaten or when he has eaten may make it more difficult for him to consume an adequate number of calories and enough fluid during the day.  Discharge Diagnoses:  Principal Problem:   Adjustment disorder with depressed mood Active Problems:   Right foot ulcer (HCC)   Hypertension   CKD (chronic kidney disease) stage 3, GFR 30-59 ml/min (HCC)   AKI (acute kidney injury) ([place])   Uncontrolled type 2 diabetes mellitus with hyperglycemia (HCC)   Uremia, acute   Weakness   Dysphagia   Protein-calorie malnutrition, severe  Acute on chronic kidney disease stage IV with uremic symptoms.  BUN and creatinine trended down.  Creatinine 2.77, BUN 79 on date of discharge -  Appreciate nephrology assistance, f/u in 10 days -  discontinued Lasix  Hyperkalemia, resolved with Kayexalate  Severe protein calorie malnutrition - Nutrition assistance appreciated - Regular diet - Supplements ordered  Dysphagia -  Speech therapy assistance appreciated -  Modified barium swallow completed -  Regular diet with thin liquids  Type 2 diabetes mellitus with diabetic neuropathy.  He was initially hypoglycemic.  After he liberalized his diet he became hyperglycemic. -Hemoglobin A1c 7.3 -Continue renally dose gabapentin - Asked that he cut back to 5 units levemir initially when he returns home and increase to 9 if AM blood sugar remains high -  Continued SSI  History of CVA -Continued Plavix, aspirin, statin  Weakness: Likely related to his uremia I suspect his poor p.o. intake is also related to the uremia. -PT recommended HH  Right foot ulcer with some wound dehiscence.  Dr. [name] reviewed pictures.   Patient has been on antibiotics for almost a month - Continue dressing changes and NWB RLE  - d/c abx  - Follow up with Dr. [name] as outpatient  Chronic diastolic heart failure, appears more euvolemic.  Holding diuretics due to risk  of dehydration  Hypertension: Blood pressure improved, continue BiDil, carvedilol  Dementia, stable, continue Namenda  Glaucoma, stable, continue latanoprost  Depression/depressed mood, denies suicidal ideation.   -Psychiatry consult appreciated -Continue Effexor  Seizure disorder, stable/seizure-free, continue Lamictal   Discharge Instructions  Discharge Instructions    (HEART FAILURE PATIENTS) Call MD:  Anytime you have any of the following symptoms: 1) 3 pound weight gain in 24 hours or 5 pounds in 1 week 2) shortness of breath, with or without a dry hacking cough 3) swelling in the hands, feet or stomach 4) if you have to sleep on extra pillows at night in order to breathe.   Complete by:  As directed    Call MD for:  difficulty breathing, headache or visual disturbances   Complete by:  As directed    Call MD for:  extreme fatigue   Complete by:  As directed    Call MD for:  hives   Complete by:  As directed    Call MD for:  persistant dizziness or light-headedness   Complete by:  As directed    Call MD for:  persistant nausea and vomiting   Complete by:  As directed    Call MD for:  severe uncontrolled pain   Complete by:  As directed    Call MD for:  temperature >100.4   Complete by:  As directed    Diet general   Complete by:  As directed    Increase activity slowly   Complete by:  As directed      Allergies as of 05/28/2017      Reactions   Codeine Anaphylaxis   Penicillins Anaphylaxis, Other (See Comments)   PATIENT HAD A PCN REACTION WITH IMMEDIATE RASH, FACIAL/TONGUE/THROAT SWELLING, SOB, OR LIGHTHEADEDNESS WITH HYPOTENSION:  #  #  #  YES  #  #  #  Has patient had a PCN reaction causing severe rash involving mucus membranes or skin necrosis: No Has patient had a PCN reaction that required hospitalization: Unknown Has patient had a PCN reaction occurring within the last 10 years: No If all of the above answers are "NO", then may proceed with  Cephalosporin use.      Medication List    STOP taking these medications   azithromycin 250 MG tablet Commonly known as:  ZITHROMAX   doxycycline 100 MG tablet Commonly known as:  VIBRA-TABS     TAKE these medications   acetaminophen 500 MG tablet Commonly known as:  TYLENOL Take 500 mg by mouth 2 (two) times daily as needed for moderate pain or headache.   ASPERCREME LIDOCAINE EX Apply 1 application topically daily as needed (for pain).   atorvastatin 20 MG tablet Commonly known as:  LIPITOR Take 20 mg by mouth every evening.   carvedilol 6.25 MG tablet Commonly known as:  COREG Take 1 tablet (6.25 mg total) by mouth 2 (two) times daily with a meal.   CENTRUM SILVER ADULT 50+ PO Take 1 tablet by mouth daily.   clopidogrel 75 MG tablet Commonly known as:  PLAVIX Take 1 tablet (75 mg total) by mouth daily.   docusate sodium 100 MG  capsule Commonly known as:  COLACE Take 1 capsule (100 mg total) by mouth 2 (two) times daily.   feeding supplement (NEPRO CARB STEADY) Liqd Take 237 mLs by mouth 2 (two) times daily between meals. Start taking on:  05/29/2017   feeding supplement (PRO-STAT SUGAR FREE 64) Liqd Take 30 mLs by mouth 2 (two) times daily.   gabapentin 100 MG capsule Commonly known as:  NEURONTIN Take 400 mg by mouth at bedtime.   insulin detemir 100 UNIT/ML injection Commonly known as:  LEVEMIR Inject 0.05 mLs (5 Units total) into the skin at bedtime. What changed:  how much to take   insulin lispro 100 UNIT/ML injection Commonly known as:  HUMALOG Inject 4-7 Units into the skin 3 (three) times daily before meals. Units given depends on BS   isosorbide-hydrALAZINE 20-37.5 MG tablet Commonly known as:  BIDIL Take 1 tablet by mouth 2 (two) times daily.   lamoTRIgine 100 MG tablet Commonly known as:  LAMICTAL Take 1 tablet (100 mg total) by mouth 2 (two) times daily.   memantine 10 MG tablet Commonly known as:  NAMENDA Take 1 tablet (10 mg total)  by mouth 2 (two) times daily.   polyethylene glycol packet Commonly known as:  MIRALAX / GLYCOLAX Take 17 g by mouth daily as needed for mild constipation.   silver sulfADIAZINE 1 % cream Commonly known as:  SILVADENE Apply 1 application topically daily as needed (sores).   tamsulosin 0.4 MG Caps capsule Commonly known as:  FLOMAX Take 1 capsule (0.4 mg total) by mouth daily after supper. What changed:  when to take this   timolol 0.5 % ophthalmic solution Commonly known as:  BETIMOL Place 1 drop into both eyes 2 (two) times daily.   Travoprost (BAK Free) 0.004 % Soln ophthalmic solution Commonly known as:  [name] Place 1 drop into both eyes at bedtime.   venlafaxine XR 75 MG 24 hr capsule Commonly known as:  EFFEXOR-XR Take 75 mg by mouth daily.   vitamin C 500 MG tablet Commonly known as:  ASCORBIC ACID Take 500 mg by mouth daily.      Follow-up Information    Newt Minion, MD Follow up in 1 week(s).   Specialty:  Orthopedic Surgery Contact information: [place] [address] [phone]        Corliss Parish, MD Follow up on 06/07/2017.   Specialty:  Nephrology Why:  2:30pm Contact information: [place] [address] [phone]          Allergies  Allergen Reactions  . Codeine Anaphylaxis  . Penicillins Anaphylaxis and Other (See Comments)    PATIENT HAD A PCN REACTION WITH IMMEDIATE RASH, FACIAL/TONGUE/THROAT SWELLING, SOB, OR LIGHTHEADEDNESS WITH HYPOTENSION:  #  #  #  YES  #  #  #  Has patient had a PCN reaction causing severe rash involving mucus membranes or skin necrosis: No Has patient had a PCN reaction that required hospitalization: Unknown Has patient had a PCN reaction occurring within the last 10 years: No If all of the above answers are "NO", then may proceed with Cephalosporin use.     Consultations: Nephrology   Procedures/Studies: Dg Chest 2 View  Result Date: 05/11/2017 CLINICAL  DATA:  Altered mental status and hyperglycemia EXAM: CHEST  2 VIEW COMPARISON:  05/06/2017 FINDINGS: The heart size and mediastinal contours are within normal limits. Minimal aortic atherosclerosis without aneurysm. Both lungs are clear. Chronic right fourth and fifth rib fractures with  callus. IMPRESSION: No active cardiopulmonary disease. Electronically Signed   By: Ashley Royalty M.D.   On: 05/11/2017 23:01   Dg Chest 2 View  Result Date: 05/06/2017 CLINICAL DATA:  New onset weakness. EXAM: CHEST  2 VIEW COMPARISON:  07/02/2015 FINDINGS: The cardiomediastinal silhouette is within normal limits. The lungs are well inflated and clear. No pleural effusion or pneumothorax is identified. Old right rib fractures are noted. IMPRESSION: No active cardiopulmonary disease. Electronically Signed   By: Logan Bores M.D.   On: 05/06/2017 20:14   Ct Head Wo Contrast  Result Date: 05/11/2017 CLINICAL DATA:  72 year old male with altered mental status and headache. EXAM: CT HEAD WITHOUT CONTRAST TECHNIQUE: Contiguous axial images were obtained from the base of the skull through the vertex without intravenous contrast. COMPARISON:  Brain MRI dated 10/29/2016 FINDINGS: Evaluation of this exam is limited due to motion artifact. Brain: There is moderate age-related atrophy and chronic microvascular ischemic changes there is an area of old infarct and encephalomalacia in the left occipital lobe. There is no acute intracranial hemorrhage. No mass effect or midline shift. No extra-axial fluid collection. Vascular: Atherosclerotic calcification of the cavernous and supraclinoid ICA. Skull: Normal. Negative for fracture or focal lesion. Sinuses/Orbits: No acute finding. Other: None IMPRESSION: 1. No acute intracranial hemorrhage. 2. Moderate age-related atrophy and chronic microvascular ischemic changes. Old left occipital infarct and encephalomalacia. Electronically Signed   By: Anner Crete M.D.   On: 05/11/2017 23:28   Mr  Brain Wo Contrast  Result Date: 05/12/2017 CLINICAL DATA:  Altered level of consciousness, unexplained. Patient was hypothermic. Symptoms have since returned to baseline. EXAM: MRI HEAD WITHOUT CONTRAST TECHNIQUE: Multiplanar, multiecho pulse sequences of the brain and surrounding structures were obtained without intravenous contrast. COMPARISON:  CT head without contrast 05/11/2017. MRI brain 10/29/2016 FINDINGS: Brain: Remote left PCA territory infarct is again seen. No acute infarct, hemorrhage, or mass lesion is present. A remote infarct of the right lentiform nucleus is stable. Extensive white matter disease is stable. Ventricles are proportionate to the degree of atrophy with areas of ex vacuo dilation secondary to volume loss. No significant extra-axial fluid collections are present. White matter changes extend into the brainstem. A remote left cerebellar white matter infarct is stable. Vascular: Flow is present in the major intracranial arteries. Skull and upper cervical spine: Skull base is within normal limits. The craniocervical junction is normal. The upper cervical spine is within normal limits. Marrow signal is normal. Sinuses/Orbits: The paranasal sinuses and right mastoid air cells are clear. There is some fluid in left mastoid air cells. No obstructing nasopharyngeal lesion is present. The patient is status post bilateral lens replacements. The globes and orbits are within normal limits. IMPRESSION: 1. No acute intracranial abnormality or significant interval change. 2. Remote infarcts involving the left PCA territory the right lentiform nucleus, and left middle cerebellar peduncle. 3. Diffuse atrophy and white matter disease is moderate to severely advanced for age, stable. This likely reflects the sequela of chronic microvascular ischemia. 4. Left mastoid effusion. No obstructing nasopharyngeal lesion is present. Electronically Signed   By: San Morelle M.D.   On: 05/12/2017 13:17   Dg  Chest Port 1 View  Result Date: 05/25/2017 CLINICAL DATA:  Weakness and shortness of breath. History of CHF, diabetes, hypertension, pneumonia, stroke, seizures, kidney disease and sepsis EXAM: PORTABLE CHEST 1 VIEW COMPARISON:  Chest x-rays dated 05/11/2017 and 07/02/2015 FINDINGS: Heart size and mediastinal contours are normal. Subtle nodular density at the right lung  base, possibly nipple shadow. Lungs otherwise clear. No pleural effusion or pneumothorax. Osseous structures about the chest are unremarkable. IMPRESSION: 1. Subtle small nodular density at the right lung base. This could represent a nipple shadow or pulmonary nodule. Recommend either repeat chest x-ray with nipple markers in place or chest CT for definitive characterization. 2. No acute findings.  No evidence of pneumonia or pulmonary edema. Electronically Signed   By: Franki Cabot M.D.   On: 05/25/2017 11:55   Dg Abd Portable 1v  Result Date: 05/07/2017 CLINICAL DATA:  Ileus.  Periumbilical pain. EXAM: PORTABLE ABDOMEN - 1 VIEW COMPARISON:  None. FINDINGS: A large amount of fecal retention is seen within the colon. No small bowel dilatation is noted. There is lower lumbar facet arthropathy from L4 through S1. 3 cannulated screws traverse the left femoral neck. No free air, organomegaly or radiopaque calculi. IMPRESSION: Increased colonic stool burden consistent with constipation. Lower lumbar facet arthropathy. Three cannulated screws traverse a remote subcapital left femoral neck fracture. Electronically Signed   By: Ashley Royalty M.D.   On: 05/07/2017 21:40    Subjective: Denies chest pain, SOB, nausea.  Again has a hard time remembering what he ate, but records show that he ate better yesterday than before  Discharge Exam: Vitals:   05/27/17 2143 05/28/17 0412  BP: (!) 120/58 (!) 141/58  Pulse: 72 64  Resp: 17 16  Temp: 98.5 F (36.9 C) 98.5 F (36.9 C)  SpO2: 100% 100%   Vitals:   05/27/17 0500 05/27/17 1537 05/27/17 2143  05/28/17 0412  BP: (!) 152/57 (!) 107/54 (!) 120/58 (!) 141/58  Pulse: 64 70 72 64  Resp: '17 16 17 16  ' Temp: 97.9 F (36.6 C) 98.2 F (36.8 C) 98.5 F (36.9 C) 98.5 F (36.9 C)  TempSrc: Oral Oral Oral Oral  SpO2: 100% 100% 100% 100%  Weight: 57 kg (125 lb 10.6 oz)   60.1 kg (132 lb 7.9 oz)  Height:        General: cachectic adult, alert, awake, not in acute distress Cardiovascular: RRR, S1/S2 +, no rubs, no gallops Respiratory: CTA bilaterally, no wheezing, no rhonchi Abdominal: Soft, NT, ND, bowel sounds + Extremities:  Right foot wrapped with kerlix and in a prevalon boot.  I did not remove today for reexamination.  Left BKA.     The results of significant diagnostics from this hospitalization (including imaging, microbiology, ancillary and laboratory) are listed below for reference.     Microbiology: No results found for this or any previous visit (from the past 240 hour(s)).   Labs: BNP (last 3 results) Recent Labs    05/06/17 2042  BNP 00.7   Basic Metabolic Panel: Recent Labs  Lab 05/25/17 1127 05/25/17 1226 05/26/17 0428 05/27/17 0510 05/28/17 0423  NA 146* 145 144 138 137  K 5.0 5.4* 4.3 4.0 4.2  CL 113* 117* 112* 110 109  CO2 20*  --  21* 20* 19*  GLUCOSE 140* 143* 108* 63* 265*  BUN 126* 109* 102* 77* 79*  CREATININE 3.54* 3.70* 3.19* 2.65* 2.77*  CALCIUM 9.6  --  8.8* 8.2* 8.2*  PHOS  --   --  4.3 3.5 3.5   Liver Function Tests: Recent Labs  Lab 05/25/17 1127 05/26/17 0428 05/27/17 0510 05/28/17 0423  AST 27  --   --   --   ALT 17  --   --   --   ALKPHOS 121  --   --   --  BILITOT 0.6  --   --   --   PROT 6.7  --   --   --   ALBUMIN 3.1* 2.5* 2.2* 2.1*   No results for input(s): LIPASE, AMYLASE in the last 168 hours. No results for input(s): AMMONIA in the last 168 hours. CBC: Recent Labs  Lab 05/25/17 1226 05/25/17 1819 05/26/17 0428  WBC  --  9.6 8.7  HGB 8.5* 10.9* 9.6*  HCT 25.0* 35.7* 30.9*  MCV  --  93.5 92.2  PLT  --   198 205   Cardiac Enzymes: No results for input(s): CKTOTAL, CKMB, CKMBINDEX, TROPONINI in the last 168 hours. BNP: Invalid input(s): POCBNP CBG: Recent Labs  Lab 05/27/17 1717 05/27/17 2154 05/28/17 0348 05/28/17 0752 05/28/17 1220  GLUCAP 190* 236* 281* 218* 328*   D-Dimer No results for input(s): DDIMER in the last 72 hours. Hgb A1c Recent Labs    05/28/17 0423  HGBA1C 7.3*   Lipid Profile No results for input(s): CHOL, HDL, LDLCALC, TRIG, CHOLHDL, LDLDIRECT in the last 72 hours. Thyroid function studies No results for input(s): TSH, T4TOTAL, T3FREE, THYROIDAB in the last 72 hours.  Invalid input(s): FREET3 Anemia work up Recent Labs    05/26/17 0428  TIBC 102*  IRON 52   Urinalysis    Component Value Date/Time   COLORURINE YELLOW 05/25/2017 [place] 05/25/2017 1127   LABSPEC 1.013 05/25/2017 1127   PHURINE 5.0 05/25/2017 1127   GLUCOSEU NEGATIVE 05/25/2017 1127   HGBUR NEGATIVE 05/25/2017 1127   [place] 05/25/2017 1127   KETONESUR NEGATIVE 05/25/2017 1127   PROTEINUR 30 (A) 05/25/2017 1127   UROBILINOGEN 0.2 12/07/2012 1140   NITRITE NEGATIVE 05/25/2017 1127   LEUKOCYTESUR NEGATIVE 05/25/2017 1127   Sepsis Labs Invalid input(s): PROCALCITONIN,  WBC,  LACTICIDVEN   Time coordinating discharge: Over 30 minutes  SIGNED:   Janece Canterbury, MD  [facility] 05/28/2017, 1:40 PM Pager   If 7PM-7AM, please contact night-coverage [email or web address] Password [email or web address]

## 2017-05-28 NOTE — Progress Notes (Signed)
  Speech Language Pathology Treatment: Dysphagia  Patient Details Name: [name] MRN: [id number] DOB: 03/31/1945 Today's Date: 05/28/2017 Time: 0076-2263 SLP Time Calculation (min) (ACUTE ONLY): 15 min  Assessment / Plan / Recommendation Clinical Impression  Pt was seen at bedside for follow up after BSE/MBS completed this weekend. Pt was finishing breakfast upon arrival of SLP. No overt s/s aspiration observed or reported, however, pt continues to report intermittent globus sensation. SLP provided written information regarding dietary and behavioral management strategies for management of esophageal dysmotility, and reviewed these with pt.  No further ST intervention re: swallowing is recommended at this time. Please reconsult if needs arise.     HPI HPI: Pt is a 72 y.o. male with medical history significant for tonic combined congestive heart failure, chronic kidney disease stage V, seizure disorder, previous history of stroke with no residual deficits, diabetes type 2, chronic anemia, PNA, and memory disorder. He was recently admitted for confusion and had surgery for right foot ulcer debridement.  He presented to the ED with generalized weakness and uremia. CXR 3/8 showed a subtle small nodular density at the R lung base. No evidence of PNA. He has been reporting trouble swallowing, weight loss, and hypophonia, for which he saw ENT 05/24/17. They found food particiles on his base of tongue and in his mouth as well as bilateral muscle tension with a glottic chink with phonation, but no glottal insufficiency. They recommend MBS, for which he was scheduled on an outpatient basis, although this had not yet been completed. Prior BSE in 2014 revealed intermittent throat clearing, suspicious for esophageal component, with recommendations to continue with regular textures and thin liquids.      SLP Plan  Discharge SLP dysphagia treatment due to all goals met       Recommendations  Diet  recommendations: Regular;Thin liquid Medication Administration: Whole meds with liquid Supervision: Patient able to self feed Compensations: Slow rate;Small sips/bites;Follow solids with liquid;Minimize environmental distractions Postural Changes and/or Swallow Maneuvers: Out of bed for meals;Seated upright 90 degrees;Upright 30-60 min after meal                Oral Care Recommendations: Oral care BID Follow up Recommendations: None SLP Visit Diagnosis: Dysphagia, unspecified (R13.10) Plan: Discharge SLP treatment due to (comment);All goals met       GO              Torrell Krutz B. Quentin Ore [facility], CCC-SLP Speech Language Pathologist [phone]  Shonna Chock 05/28/2017, 9:56 AM
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
## 2017-05-28 NOTE — Progress Notes (Signed)
Patient ID: [name], male   DOB: [birth year], 72 y.o.   MRN: [id number] Patient with severe protein caloric malnutrition.  Patient's wounds show some slight dehiscence secondary to his nutritional status.  Continue with the foam boot continue with nonweightbearing.  I will follow-up as an outpatient.

## 2017-05-28 NOTE — Progress Notes (Signed)
Nutrition Follow-up  DOCUMENTATION CODES:   Severe malnutrition in context of chronic illness  INTERVENTION:   -Continue liberalized diet of regular -Continue Nepro Shake po BID, each supplement provides 425 kcal and 19 grams protein -Continue 30 ml Prostat BID, each supplement provides 100 kcals and 15 grams protein -D/c calorie count, as pt is meeting 100% of estimated energy needs via meals and supplements  NUTRITION DIAGNOSIS:   Severe Malnutrition related to poor appetite, chronic illness(HF, CKDIV-V, DM2) as evidenced by severe muscle depletion, severe fat depletion, percent weight loss.  Ongoing  GOAL:   Patient will meet greater than or equal to 90% of their needs  Progressing  MONITOR:   PO intake, Supplement acceptance, Diet advancement, Labs, Weight trends, Skin, I & O's  REASON FOR ASSESSMENT:   Consult Assessment of nutrition requirement/status, Diet education, Calorie Count  ASSESSMENT:   The patient is a 72 year old male with history of combined systolic and diastolic heart failure, chronic kidney disease stage V, seizure disorder, history of stroke with no residual deficits, diabetes mellitus type 2, chronic anemia who was recently admitted for confusion and had debridement of a right foot ulcer.  Over the last few days he has had decreased appetite.  Since November 2018 he has had a 25-30 pound weight loss which he and his wife attribute partly to his inability to swallow food normally.   3/10- s/p BSE- recommend regular diet with thin liquids, given recommendations for esophageal dysmotility  Case discussed with RN prior to visit. She reports that pt with very good intake, consumed 100% of breakfast. He is also taking supplements well (Nepro and Prostat). Pt will likely go home today with home health services.   Pt somnolent at time of visit; did not arouse during attempts to waken. No family at bedside. Did not provide educational material, as diet has  been liberalized to regular, which is appropriate given history of poor oral intake, multiple wounds, and malnutrition.   24 hour calorie count results as as follows:  Breakfast: 479 kcals, 18 grams protein Lunch: 308 kcals, 17 grams protein Dinner: 320 kcals, 12 grams protein Supplements: 2 Prostat supplements (200 kcals, 30 grams protein), 2 Nepro supplements (850 kcals, 38 grams protein)  Total intake: 2157 kcal (100% of minimum estimated needs)  115 grams protein (100% of minimum estimated needs)  Labs reviewed: CBGS: 190-281 (inpatien orders for glycemic control are 0-9 units insulin aspart TID).   Diet Order:  Diet regular Room service appropriate? Yes; Fluid consistency: Thin  EDUCATION NEEDS:   No education needs have been identified at this time  Skin:  Skin Assessment: Skin Integrity Issues: Skin Integrity Issues:: Other (Comment), Incisions Incisions: lt thigh Other: non healing ulcer rt lower extremity, rt charcot foot  Last BM:  05/27/17  Height:   Ht Readings from Last 1 Encounters:  05/25/17 5\' 10"  (1.778 m)    Weight:   Wt Readings from Last 1 Encounters:  05/28/17 132 lb 7.9 oz (60.1 kg)    Ideal Body Weight:  70.55 kg(Adjusted for BKA)  BMI:  Body mass index is 19.01 kg/m.  Estimated Nutritional Needs:   Kcal:  1950-2200 (35-40 kcal/kg bw)  Protein:  85-100g Pro (1.5-1.8 g/kg bw)  Fluid:  Per MD goals    [name], RD, LDN, CDE Pager: [phone] After hours Pager: [phone]

## 2017-05-28 NOTE — Discharge Instructions (Signed)

## 2017-05-28 NOTE — Progress Notes (Signed)
Inpatient Diabetes Program Recommendations  AACE/ADA: New Consensus Statement on Inpatient Glycemic Control (2015)  Target Ranges:  Prepandial:   less than 140 mg/dL      Peak postprandial:   less than 180 mg/dL (1-2 hours)      Critically ill patients:  140 - 180 mg/dL   Lab Results  Component Value Date   GLUCAP 218 (H) 05/28/2017   HGBA1C 7.3 (H) 05/28/2017   Review of Glycemic Control  Diabetes history: DM 2 Outpatient Diabetes medications: Levemir 9 units QHS, Humalog 4-7 units tid Current orders for Inpatient glycemic control: Novolog Sensitive Correction 0-9 units tid  Inpatient Diabetes Program Recommendations:    A1c 7.3% on 3/11 down from 8% on 2/15. Elevated renal function.  Consider decreasing Novolog Correction back down to custom scale starting at 150 mg/dl  -Custom Novolog correction scale 0-5 units       151-200  1 unit      201-250  2 units      251-300  3 units      301-350  4 units      351-400  5 units  Consider Levemir 4 units. Patient is sensitive to Novolog doses. 1 unit seems to drop patient 55 mg/dl at times. Could also consider adding meal coverage later, but patient has to consistently eat at least 50% of meals.  Thanks,  Tama Headings RN, MSN, BC-ADM, [facility] Inpatient Diabetes Coordinator Team Pager [phone] (8a-5p)

## 2017-05-28 NOTE — Progress Notes (Signed)
[facility] Rounding Note  Background: 72 y.o. year-old well known to me w/CKD 3/4 at baseline, DM, HTN, PAD, chronic systolic and diastolic HF, sx disorder, h/o stroke. Issue with eating for at least the past year (NOT related to renal failure until recently) dislikes food without salt so does not eat. Weight decline documented in our office over the past year INDEPENDENT of his GFR which until recently has been around 25-30 in the office.  I last saw 04/03/17 with creatinine of 2.45 eGFR 30 BUN 52 and [name] 30 lb weight loss over course of 2018.  Interim hosp 2/22-2/25 w/acute encephalopathy, RLE infection that required debridement and AKI on CKD with peak creatinine 3.5, down to 2.75 at discharge. Poor po since that admission. Seen again at [facility]  05/23/17 with c/o poor appetite as opposed to usual c/o of not liking the food, BP was in the 90's, weight was down to 142. Lasix was stopped, labs done BUN and creatinine 124/3.83   Brought by EMS from home 3/8 with ^ weakness, creatinine 3.54, BUN 126. Asked to see.  Assessment/Recommendations  1. Recurrent AKI on CKD4 - markedly azotemic (and possibly WAS uremic on admission) but his major weight loss over course of 2018 was NOT related to GFR which in the office was largely right around 25 - 30 until this last office visit. In addition at least 2 AKI's this past month accounting for worsening renal fx, azotemia and poor po. He looked absolutely desiccated on admission physical exam.  1. Marked improvement in exam and labs with IVF hydration. Nearing previous baseline GFR, making plenty of urine. I do NOT recommend proceeding with HD catheter at this time. I think need to see if po improves with full correction of volume depletion related azotemia (number are better, ate 100% of is bfast today!!). 2. Additionally, until recent sig azotemia, his poor food intake has NOT been related to no appetite as much as refusal to eat. Need to clarify his swallowing issues as  well. 3. Continue IV 1/2 NS for now, lower rate to 75, see if can keep up on his own. 4. Check labs AM 5. No lasix at discharge 2. Hyperkalemia - Resolved. Prior history. Low renin low aldo 2/2 DM. Avoid all  K sparing drugs.  3. Anemia - On aranesp since 05/2016. Last dosed 05/08/17. TSat 51. Dosed 3/9 (100 mcg). 4. DM - per primary team 5. H/o stroke - carotid siphon ds. Neuro has prev recommended maintaining systolic BP 333-545 6. R foot ulcer - debrided in February. Dressing changed. Prevalon boot applied. 7. Dementia - memantine 8. Sz disorder well controlled 9. BPH - w/elevated PSA. Had been scheduled for prostate Bx for late January no CA per pt  Renal is Ok with discharge   Has appt to see me in the office 3/21 at 2:30. Once discharged I will have the office call with [name] date for repeat labs. Cancelling [facility] appt for now. Wife aware.  Devin [name] 05/27/2017, 11:54 AM  Subjective/Interval History:  Feels better- appetite seems to be on rise- plan is to go home with home health today- good UOP   Objective Vital signs in last 24 hours: Vitals:   05/27/17 0500 05/27/17 1537 05/27/17 2143 05/28/17 0412  BP: (!) 152/57 (!) 107/54 (!) 120/58 (!) 141/58  Pulse: 64 70 72 64  Resp: '17 16 17 16  ' Temp: 97.9 F (36.6 C) 98.2 F (36.8 C) 98.5 F (36.9 C) 98.5 F (36.9 C)  TempSrc:  Oral Oral Oral Oral  SpO2: 100% 100% 100% 100%  Weight: 57 kg (125 lb 10.6 oz)   60.1 kg (132 lb 7.9 oz)  Height:       Weight change: 3.1 kg (6 lb 13.3 oz)  Intake/Output Summary (Last 24 hours) at 05/28/2017 1237 Last data filed at 05/28/2017 1043 Gross per 24 hour  Intake 390 ml  Output 1100 ml  Net -710 ml   Physical Exam:  Blood pressure (!) 141/58, pulse 64, temperature 98.5 F (36.9 C), temperature source Oral, resp. rate 16, height '5\' 10"'  (1.778 m), weight 60.1 kg (132 lb 7.9 oz), SpO2 100 %.   Wife in the room - endorses that he looks better Improvement in skin turgor No  JVD Lungs remain entirely clear S1S2 No S3 Abd scaphoid, no focal tenderness No edema LE's Oriented X3 NO asterixis On bedpan in chair   Recent Labs  Lab 05/25/17 1127 05/25/17 1226 05/26/17 0428 05/27/17 0510 05/28/17 0423  NA 146* 145 144 138 137  K 5.0 5.4* 4.3 4.0 4.2  CL 113* 117* 112* 110 109  CO2 20*  --  21* 20* 19*  GLUCOSE 140* 143* 108* 63* 265*  BUN 126* 109* 102* 77* 79*  CREATININE 3.54* 3.70* 3.19* 2.65* 2.77*  CALCIUM 9.6  --  8.8* 8.2* 8.2*  PHOS  --   --  4.3 3.5 3.5    Recent Labs  Lab 05/25/17 1127 05/26/17 0428 05/27/17 0510 05/28/17 0423  AST 27  --   --   --   ALT 17  --   --   --   ALKPHOS 121  --   --   --   BILITOT 0.6  --   --   --   PROT 6.7  --   --   --   ALBUMIN 3.1* 2.5* 2.2* 2.1*    Recent Labs  Lab 05/25/17 1226 05/25/17 1819 05/26/17 0428  WBC  --  9.6 8.7  HGB 8.5* 10.9* 9.6*  HCT 25.0* 35.7* 30.9*  MCV  --  93.5 92.2  PLT  --  198 205    Recent Labs  Lab 05/27/17 1717 05/27/17 2154 05/28/17 0348 05/28/17 0752 05/28/17 1220  GLUCAP 190* 236* 281* 218* 328*      Component Value Date/Time   IRON 52 05/26/2017 0428   TIBC 102 (L) 05/26/2017 0428   FERRITIN 216 05/02/2017 1130   IRONPCTSAT 51 (H) 05/26/2017 0428   Medications: . sodium chloride 0.45 % 1,000 mL infusion 75 mL/hr at 05/28/17 0320   . atorvastatin  20 mg Oral QPM  . azithromycin  250 mg Oral Daily  . carvedilol  6.25 mg Oral BID WC  . clopidogrel  75 mg Oral Daily  . darbepoetin (ARANESP) injection - NON-DIALYSIS  100 mcg Subcutaneous Q Sat-1800  . docusate sodium  100 mg Oral BID  . enoxaparin (LOVENOX) injection  30 mg Subcutaneous Q24H  . feeding supplement (NEPRO CARB STEADY)  237 mL Oral BID BM  . feeding supplement (PRO-STAT SUGAR FREE 64)  30 mL Oral BID  . gabapentin  400 mg Oral QHS  . insulin aspart  0-9 Units Subcutaneous TID WC  . isosorbide-hydrALAZINE  1 tablet Oral BID  . lamoTRIgine  100 mg Oral BID  . latanoprost  1  drop Both Eyes QHS  . memantine  10 mg Oral BID  . multivitamin with minerals  1 tablet Oral Daily  . tamsulosin  0.4 mg Oral Daily  .  timolol  1 drop Both Eyes BID  . venlafaxine XR  75 mg Oral Daily  . vitamin C  500 mg Oral Daily

## 2017-05-28 NOTE — Progress Notes (Signed)
Pt woke up confused at this time, trying to get up in bed now. Pt's blood sugar was 128 at this time. Reoriented pt. Redirected pt to get back in bed. Pt was cooperative. Will monitor pt.

## 2017-05-29 ENCOUNTER — Ambulatory Visit (INDEPENDENT_AMBULATORY_CARE_PROVIDER_SITE_OTHER): Payer: Medicare Other | Admitting: Orthopedic Surgery

## 2017-05-29 DIAGNOSIS — Z8673 Personal history of transient ischemic attack (TIA), and cerebral infarction without residual deficits: Secondary | ICD-10-CM | POA: Diagnosis not present

## 2017-05-29 DIAGNOSIS — D631 Anemia in chronic kidney disease: Secondary | ICD-10-CM | POA: Diagnosis not present

## 2017-05-29 DIAGNOSIS — N400 Enlarged prostate without lower urinary tract symptoms: Secondary | ICD-10-CM | POA: Diagnosis not present

## 2017-05-29 DIAGNOSIS — E11610 Type 2 diabetes mellitus with diabetic neuropathic arthropathy: Secondary | ICD-10-CM | POA: Diagnosis not present

## 2017-05-29 DIAGNOSIS — I5032 Chronic diastolic (congestive) heart failure: Secondary | ICD-10-CM | POA: Diagnosis not present

## 2017-05-29 DIAGNOSIS — E1122 Type 2 diabetes mellitus with diabetic chronic kidney disease: Secondary | ICD-10-CM | POA: Diagnosis not present

## 2017-05-29 DIAGNOSIS — E1151 Type 2 diabetes mellitus with diabetic peripheral angiopathy without gangrene: Secondary | ICD-10-CM | POA: Diagnosis not present

## 2017-05-29 DIAGNOSIS — N184 Chronic kidney disease, stage 4 (severe): Secondary | ICD-10-CM | POA: Diagnosis not present

## 2017-05-29 DIAGNOSIS — I130 Hypertensive heart and chronic kidney disease with heart failure and stage 1 through stage 4 chronic kidney disease, or unspecified chronic kidney disease: Secondary | ICD-10-CM | POA: Diagnosis not present

## 2017-05-29 DIAGNOSIS — T8141XA Infection following a procedure, superficial incisional surgical site, initial encounter: Secondary | ICD-10-CM | POA: Diagnosis not present

## 2017-05-30 ENCOUNTER — Encounter (HOSPITAL_COMMUNITY)
Admission: RE | Admit: 2017-05-30 | Discharge: 2017-05-30 | Disposition: A | Discharge status: Home / Self Care | Payer: Medicare Other | Source: Ambulatory Visit | Attending: Nephrology | Admitting: Nephrology

## 2017-05-30 VITALS — BP 128/45 | HR 72 | Temp 98.5°F | Resp 20

## 2017-05-30 DIAGNOSIS — D631 Anemia in chronic kidney disease: Secondary | ICD-10-CM | POA: Diagnosis not present

## 2017-05-30 DIAGNOSIS — N183 Chronic kidney disease, stage 3 unspecified: Secondary | ICD-10-CM

## 2017-05-30 LAB — POCT HEMOGLOBIN-HEMACUE: HEMOGLOBIN: 8.4 g/dL — AB (ref 13.0–17.0)

## 2017-05-30 LAB — FERRITIN: FERRITIN: 240 ng/mL (ref 24–336)

## 2017-05-30 MED ORDER — DARBEPOETIN ALFA 25 MCG/0.42ML IJ SOSY
PREFILLED_SYRINGE | INTRAMUSCULAR | Status: AC
  Administered 2017-05-30: 12:00:00 50 ug via SUBCUTANEOUS
  Filled 2017-05-30: qty 0.84

## 2017-05-30 MED ORDER — DARBEPOETIN ALFA 100 MCG/0.5ML IJ SOSY
PREFILLED_SYRINGE | INTRAMUSCULAR | Status: AC
  Administered 2017-05-30: 100 ug via SUBCUTANEOUS
  Filled 2017-05-30: qty 0.5

## 2017-05-30 MED ORDER — DARBEPOETIN ALFA 100 MCG/0.5ML IJ SOSY
100.0000 ug | PREFILLED_SYRINGE | INTRAMUSCULAR | Status: DC
  Administered 2017-05-30: 100 ug via SUBCUTANEOUS

## 2017-05-30 MED ORDER — DARBEPOETIN ALFA 25 MCG/0.42ML IJ SOSY
50.0000 ug | PREFILLED_SYRINGE | Freq: Once | INTRAMUSCULAR | Status: AC
  Administered 2017-05-30: 50 ug via SUBCUTANEOUS

## 2017-05-30 NOTE — Progress Notes (Signed)
Hemocue 8.4, checked twice. Pt DC from hospital on Monday due to dehydration per pt's wife. Hemoglobin from CBC 4 days ago was 9.6. Stacey with [facility] called and made aware of all this and that pt denies worsening shortness of breath, chest pain or fast pulse. Per Dr. Lorrene Reid, increase Aranesp to 150 mcgs which was given today. Pt and pt's wife verbalized understanding to return to ED with new/worsening symptoms.

## 2017-05-31 DIAGNOSIS — D631 Anemia in chronic kidney disease: Secondary | ICD-10-CM | POA: Diagnosis not present

## 2017-05-31 DIAGNOSIS — N184 Chronic kidney disease, stage 4 (severe): Secondary | ICD-10-CM | POA: Diagnosis not present

## 2017-05-31 DIAGNOSIS — Z8673 Personal history of transient ischemic attack (TIA), and cerebral infarction without residual deficits: Secondary | ICD-10-CM | POA: Diagnosis not present

## 2017-05-31 DIAGNOSIS — E1122 Type 2 diabetes mellitus with diabetic chronic kidney disease: Secondary | ICD-10-CM | POA: Diagnosis not present

## 2017-05-31 DIAGNOSIS — E11610 Type 2 diabetes mellitus with diabetic neuropathic arthropathy: Secondary | ICD-10-CM | POA: Diagnosis not present

## 2017-05-31 DIAGNOSIS — T8141XA Infection following a procedure, superficial incisional surgical site, initial encounter: Secondary | ICD-10-CM | POA: Diagnosis not present

## 2017-05-31 DIAGNOSIS — I5032 Chronic diastolic (congestive) heart failure: Secondary | ICD-10-CM | POA: Diagnosis not present

## 2017-05-31 DIAGNOSIS — N400 Enlarged prostate without lower urinary tract symptoms: Secondary | ICD-10-CM | POA: Diagnosis not present

## 2017-05-31 DIAGNOSIS — I130 Hypertensive heart and chronic kidney disease with heart failure and stage 1 through stage 4 chronic kidney disease, or unspecified chronic kidney disease: Secondary | ICD-10-CM | POA: Diagnosis not present

## 2017-05-31 DIAGNOSIS — E1151 Type 2 diabetes mellitus with diabetic peripheral angiopathy without gangrene: Secondary | ICD-10-CM | POA: Diagnosis not present

## 2017-06-01 ENCOUNTER — Other Ambulatory Visit: Payer: Self-pay | Admitting: Gastroenterology

## 2017-06-01 DIAGNOSIS — I130 Hypertensive heart and chronic kidney disease with heart failure and stage 1 through stage 4 chronic kidney disease, or unspecified chronic kidney disease: Secondary | ICD-10-CM | POA: Diagnosis not present

## 2017-06-01 DIAGNOSIS — N400 Enlarged prostate without lower urinary tract symptoms: Secondary | ICD-10-CM | POA: Diagnosis not present

## 2017-06-01 DIAGNOSIS — Z8673 Personal history of transient ischemic attack (TIA), and cerebral infarction without residual deficits: Secondary | ICD-10-CM | POA: Diagnosis not present

## 2017-06-01 DIAGNOSIS — I5032 Chronic diastolic (congestive) heart failure: Secondary | ICD-10-CM | POA: Diagnosis not present

## 2017-06-01 DIAGNOSIS — R1031 Right lower quadrant pain: Secondary | ICD-10-CM

## 2017-06-01 DIAGNOSIS — E1151 Type 2 diabetes mellitus with diabetic peripheral angiopathy without gangrene: Secondary | ICD-10-CM | POA: Diagnosis not present

## 2017-06-01 DIAGNOSIS — Z86010 Personal history of colonic polyps: Secondary | ICD-10-CM | POA: Diagnosis not present

## 2017-06-01 DIAGNOSIS — T8141XA Infection following a procedure, superficial incisional surgical site, initial encounter: Secondary | ICD-10-CM | POA: Diagnosis not present

## 2017-06-01 DIAGNOSIS — E1122 Type 2 diabetes mellitus with diabetic chronic kidney disease: Secondary | ICD-10-CM | POA: Diagnosis not present

## 2017-06-01 DIAGNOSIS — D631 Anemia in chronic kidney disease: Secondary | ICD-10-CM | POA: Diagnosis not present

## 2017-06-01 DIAGNOSIS — D500 Iron deficiency anemia secondary to blood loss (chronic): Secondary | ICD-10-CM | POA: Diagnosis not present

## 2017-06-01 DIAGNOSIS — E11610 Type 2 diabetes mellitus with diabetic neuropathic arthropathy: Secondary | ICD-10-CM | POA: Diagnosis not present

## 2017-06-01 DIAGNOSIS — N184 Chronic kidney disease, stage 4 (severe): Secondary | ICD-10-CM | POA: Diagnosis not present

## 2017-06-04 ENCOUNTER — Encounter (INDEPENDENT_AMBULATORY_CARE_PROVIDER_SITE_OTHER): Payer: Self-pay | Admitting: Orthopedic Surgery

## 2017-06-04 ENCOUNTER — Ambulatory Visit (INDEPENDENT_AMBULATORY_CARE_PROVIDER_SITE_OTHER): Payer: Medicare Other | Admitting: Orthopedic Surgery

## 2017-06-04 VITALS — Ht 70.0 in | Wt 132.5 lb

## 2017-06-04 DIAGNOSIS — I87321 Chronic venous hypertension (idiopathic) with inflammation of right lower extremity: Secondary | ICD-10-CM

## 2017-06-04 DIAGNOSIS — M14671 Charcot's joint, right ankle and foot: Secondary | ICD-10-CM
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
## 2017-06-06 DIAGNOSIS — E11610 Type 2 diabetes mellitus with diabetic neuropathic arthropathy: Secondary | ICD-10-CM | POA: Diagnosis not present

## 2017-06-06 DIAGNOSIS — E1151 Type 2 diabetes mellitus with diabetic peripheral angiopathy without gangrene: Secondary | ICD-10-CM | POA: Diagnosis not present

## 2017-06-06 DIAGNOSIS — D631 Anemia in chronic kidney disease: Secondary | ICD-10-CM | POA: Diagnosis not present

## 2017-06-06 DIAGNOSIS — N184 Chronic kidney disease, stage 4 (severe): Secondary | ICD-10-CM | POA: Diagnosis not present

## 2017-06-06 DIAGNOSIS — N400 Enlarged prostate without lower urinary tract symptoms: Secondary | ICD-10-CM | POA: Diagnosis not present

## 2017-06-06 DIAGNOSIS — E440 Moderate protein-calorie malnutrition: Secondary | ICD-10-CM | POA: Diagnosis not present

## 2017-06-06 DIAGNOSIS — E1122 Type 2 diabetes mellitus with diabetic chronic kidney disease: Secondary | ICD-10-CM | POA: Diagnosis not present

## 2017-06-06 DIAGNOSIS — Z8673 Personal history of transient ischemic attack (TIA), and cerebral infarction without residual deficits: Secondary | ICD-10-CM | POA: Diagnosis not present

## 2017-06-06 DIAGNOSIS — I130 Hypertensive heart and chronic kidney disease with heart failure and stage 1 through stage 4 chronic kidney disease, or unspecified chronic kidney disease: Secondary | ICD-10-CM | POA: Diagnosis not present

## 2017-06-06 DIAGNOSIS — I5032 Chronic diastolic (congestive) heart failure: Secondary | ICD-10-CM | POA: Diagnosis not present

## 2017-06-06 DIAGNOSIS — R600 Localized edema: Secondary | ICD-10-CM | POA: Diagnosis not present

## 2017-06-06 DIAGNOSIS — T8141XA Infection following a procedure, superficial incisional surgical site, initial encounter: Secondary | ICD-10-CM | POA: Diagnosis not present

## 2017-06-07 DIAGNOSIS — I130 Hypertensive heart and chronic kidney disease with heart failure and stage 1 through stage 4 chronic kidney disease, or unspecified chronic kidney disease: Secondary | ICD-10-CM | POA: Diagnosis not present

## 2017-06-07 DIAGNOSIS — E11610 Type 2 diabetes mellitus with diabetic neuropathic arthropathy: Secondary | ICD-10-CM | POA: Diagnosis not present

## 2017-06-07 DIAGNOSIS — N400 Enlarged prostate without lower urinary tract symptoms: Secondary | ICD-10-CM | POA: Diagnosis not present

## 2017-06-07 DIAGNOSIS — Z8673 Personal history of transient ischemic attack (TIA), and cerebral infarction without residual deficits: Secondary | ICD-10-CM | POA: Diagnosis not present

## 2017-06-07 DIAGNOSIS — N2581 Secondary hyperparathyroidism of renal origin: Secondary | ICD-10-CM | POA: Diagnosis not present

## 2017-06-07 DIAGNOSIS — E1122 Type 2 diabetes mellitus with diabetic chronic kidney disease: Secondary | ICD-10-CM | POA: Diagnosis not present

## 2017-06-07 DIAGNOSIS — I129 Hypertensive chronic kidney disease with stage 1 through stage 4 chronic kidney disease, or unspecified chronic kidney disease: Secondary | ICD-10-CM | POA: Diagnosis not present

## 2017-06-07 DIAGNOSIS — D631 Anemia in chronic kidney disease: Secondary | ICD-10-CM | POA: Diagnosis not present

## 2017-06-07 DIAGNOSIS — E1151 Type 2 diabetes mellitus with diabetic peripheral angiopathy without gangrene: Secondary | ICD-10-CM | POA: Diagnosis not present

## 2017-06-07 DIAGNOSIS — T8141XA Infection following a procedure, superficial incisional surgical site, initial encounter: Secondary | ICD-10-CM | POA: Diagnosis not present

## 2017-06-07 DIAGNOSIS — I5032 Chronic diastolic (congestive) heart failure: Secondary | ICD-10-CM | POA: Diagnosis not present

## 2017-06-07 DIAGNOSIS — N184 Chronic kidney disease, stage 4 (severe): Secondary | ICD-10-CM | POA: Diagnosis not present

## 2017-06-08 DIAGNOSIS — I5032 Chronic diastolic (congestive) heart failure: Secondary | ICD-10-CM | POA: Diagnosis not present

## 2017-06-08 DIAGNOSIS — E1122 Type 2 diabetes mellitus with diabetic chronic kidney disease: Secondary | ICD-10-CM | POA: Diagnosis not present

## 2017-06-08 DIAGNOSIS — E11610 Type 2 diabetes mellitus with diabetic neuropathic arthropathy: Secondary | ICD-10-CM | POA: Diagnosis not present

## 2017-06-08 DIAGNOSIS — D631 Anemia in chronic kidney disease: Secondary | ICD-10-CM | POA: Diagnosis not present

## 2017-06-08 DIAGNOSIS — I130 Hypertensive heart and chronic kidney disease with heart failure and stage 1 through stage 4 chronic kidney disease, or unspecified chronic kidney disease: Secondary | ICD-10-CM | POA: Diagnosis not present

## 2017-06-08 DIAGNOSIS — N400 Enlarged prostate without lower urinary tract symptoms: Secondary | ICD-10-CM | POA: Diagnosis not present

## 2017-06-08 DIAGNOSIS — Z8673 Personal history of transient ischemic attack (TIA), and cerebral infarction without residual deficits: Secondary | ICD-10-CM | POA: Diagnosis not present

## 2017-06-08 DIAGNOSIS — T8141XA Infection following a procedure, superficial incisional surgical site, initial encounter: Secondary | ICD-10-CM | POA: Diagnosis not present

## 2017-06-08 DIAGNOSIS — E1151 Type 2 diabetes mellitus with diabetic peripheral angiopathy without gangrene: Secondary | ICD-10-CM | POA: Diagnosis not present

## 2017-06-08 DIAGNOSIS — N184 Chronic kidney disease, stage 4 (severe): Secondary | ICD-10-CM | POA: Diagnosis not present

## 2017-06-11 DIAGNOSIS — Z8673 Personal history of transient ischemic attack (TIA), and cerebral infarction without residual deficits: Secondary | ICD-10-CM | POA: Diagnosis not present

## 2017-06-11 DIAGNOSIS — N400 Enlarged prostate without lower urinary tract symptoms: Secondary | ICD-10-CM | POA: Diagnosis not present

## 2017-06-11 DIAGNOSIS — I130 Hypertensive heart and chronic kidney disease with heart failure and stage 1 through stage 4 chronic kidney disease, or unspecified chronic kidney disease: Secondary | ICD-10-CM | POA: Diagnosis not present

## 2017-06-11 DIAGNOSIS — D631 Anemia in chronic kidney disease: Secondary | ICD-10-CM | POA: Diagnosis not present

## 2017-06-11 DIAGNOSIS — E1122 Type 2 diabetes mellitus with diabetic chronic kidney disease: Secondary | ICD-10-CM | POA: Diagnosis not present

## 2017-06-11 DIAGNOSIS — N184 Chronic kidney disease, stage 4 (severe): Secondary | ICD-10-CM | POA: Diagnosis not present

## 2017-06-11 DIAGNOSIS — T8141XA Infection following a procedure, superficial incisional surgical site, initial encounter: Secondary | ICD-10-CM | POA: Diagnosis not present

## 2017-06-11 DIAGNOSIS — E1151 Type 2 diabetes mellitus with diabetic peripheral angiopathy without gangrene: Secondary | ICD-10-CM | POA: Diagnosis not present

## 2017-06-11 DIAGNOSIS — I5032 Chronic diastolic (congestive) heart failure: Secondary | ICD-10-CM | POA: Diagnosis not present

## 2017-06-11 DIAGNOSIS — E11610 Type 2 diabetes mellitus with diabetic neuropathic arthropathy: Secondary | ICD-10-CM | POA: Diagnosis not present

## 2017-06-12 ENCOUNTER — Telehealth (HOSPITAL_COMMUNITY): Payer: Self-pay | Admitting: *Deleted

## 2017-06-12 DIAGNOSIS — Z87898 Personal history of other specified conditions: Secondary | ICD-10-CM | POA: Diagnosis not present

## 2017-06-12 DIAGNOSIS — N184 Chronic kidney disease, stage 4 (severe): Secondary | ICD-10-CM | POA: Diagnosis not present

## 2017-06-12 DIAGNOSIS — Z8673 Personal history of transient ischemic attack (TIA), and cerebral infarction without residual deficits: Secondary | ICD-10-CM | POA: Diagnosis not present

## 2017-06-12 DIAGNOSIS — E1122 Type 2 diabetes mellitus with diabetic chronic kidney disease: Secondary | ICD-10-CM | POA: Diagnosis not present

## 2017-06-12 NOTE — Telephone Encounter (Signed)
Patient's wife per DPR given detailed instructions per Myocardial Perfusion Study Information Sheet for the test on 06/15/17 at 0715. Patient notified to arrive 15 minutes early and that it is imperative to arrive on time for appointment to keep from having the test rescheduled.  If you need to cancel or reschedule your appointment, please call the office within 24 hours of your appointment. . Patient verbalized understanding.[name], [place]

## 2017-06-13 ENCOUNTER — Telehealth (INDEPENDENT_AMBULATORY_CARE_PROVIDER_SITE_OTHER): Payer: Self-pay | Admitting: Orthopedic Surgery

## 2017-06-13 ENCOUNTER — Ambulatory Visit
Admission: RE | Admit: 2017-06-13 | Discharge: 2017-06-13 | Disposition: A | Discharge status: Home / Self Care | Payer: Medicare Other | Source: Ambulatory Visit | Attending: Gastroenterology | Admitting: Gastroenterology

## 2017-06-13 DIAGNOSIS — K5900 Constipation, unspecified: Secondary | ICD-10-CM | POA: Diagnosis not present

## 2017-06-13 DIAGNOSIS — D500 Iron deficiency anemia secondary to blood loss (chronic): Secondary | ICD-10-CM

## 2017-06-13 DIAGNOSIS — R1031 Right lower quadrant pain: Secondary | ICD-10-CM

## 2017-06-13 NOTE — Telephone Encounter (Signed)
Spoke with Anda Kraft: Dr.Duda stated in his last ov note that patient could resume PT and ambulation.  However, the patient needs to check the foot daily, and if there is any skin breakdown he is to stop PT and ambulation again.  She wanted to know if, since he can walk, they can increase his PT to 2 x week.  Told her we would reassess at his appt on Monday 06/18/17.  She asked if we could call her back with an update after his visit. Will hold this message (as reminder) until then.

## 2017-06-13 NOTE — Telephone Encounter (Signed)
[name]  [facility]  [phone]   Anda Kraft would like to know if the weight bearing status was lifted. Anda Kraft stated on the VM pt stated he can walk on foot per Dr.Duda. Please call to discuss pt care

## 2017-06-14 DIAGNOSIS — N400 Enlarged prostate without lower urinary tract symptoms: Secondary | ICD-10-CM | POA: Diagnosis not present

## 2017-06-14 DIAGNOSIS — N184 Chronic kidney disease, stage 4 (severe): Secondary | ICD-10-CM | POA: Diagnosis not present

## 2017-06-14 DIAGNOSIS — E1151 Type 2 diabetes mellitus with diabetic peripheral angiopathy without gangrene: Secondary | ICD-10-CM | POA: Diagnosis not present

## 2017-06-14 DIAGNOSIS — D631 Anemia in chronic kidney disease: Secondary | ICD-10-CM | POA: Diagnosis not present

## 2017-06-14 DIAGNOSIS — E1122 Type 2 diabetes mellitus with diabetic chronic kidney disease: Secondary | ICD-10-CM | POA: Diagnosis not present

## 2017-06-14 DIAGNOSIS — I130 Hypertensive heart and chronic kidney disease with heart failure and stage 1 through stage 4 chronic kidney disease, or unspecified chronic kidney disease: Secondary | ICD-10-CM | POA: Diagnosis not present

## 2017-06-14 DIAGNOSIS — I5032 Chronic diastolic (congestive) heart failure: Secondary | ICD-10-CM | POA: Diagnosis not present

## 2017-06-14 DIAGNOSIS — Z8673 Personal history of transient ischemic attack (TIA), and cerebral infarction without residual deficits: Secondary | ICD-10-CM | POA: Diagnosis not present

## 2017-06-14 DIAGNOSIS — E11610 Type 2 diabetes mellitus with diabetic neuropathic arthropathy: Secondary | ICD-10-CM | POA: Diagnosis not present

## 2017-06-14 DIAGNOSIS — T8141XA Infection following a procedure, superficial incisional surgical site, initial encounter: Secondary | ICD-10-CM | POA: Diagnosis not present

## 2017-06-15 ENCOUNTER — Ambulatory Visit (HOSPITAL_COMMUNITY): Payer: Medicare Other | Attending: Cardiovascular Disease

## 2017-06-15 DIAGNOSIS — R079 Chest pain, unspecified: Secondary | ICD-10-CM | POA: Diagnosis not present

## 2017-06-15 DIAGNOSIS — I5042 Chronic combined systolic (congestive) and diastolic (congestive) heart failure: Secondary | ICD-10-CM | POA: Diagnosis not present

## 2017-06-15 DIAGNOSIS — I2583 Coronary atherosclerosis due to lipid rich plaque: Secondary | ICD-10-CM

## 2017-06-15 DIAGNOSIS — E109 Type 1 diabetes mellitus without complications: Secondary | ICD-10-CM | POA: Insufficient documentation

## 2017-06-15 DIAGNOSIS — I10 Essential (primary) hypertension: Secondary | ICD-10-CM | POA: Insufficient documentation

## 2017-06-15 DIAGNOSIS — I2510 Atherosclerotic heart disease of native coronary artery without angina pectoris: Secondary | ICD-10-CM | POA: Insufficient documentation

## 2017-06-15 DIAGNOSIS — Z794 Long term (current) use of insulin: Secondary | ICD-10-CM | POA: Insufficient documentation

## 2017-06-15 DIAGNOSIS — R9439 Abnormal result of other cardiovascular function study: Secondary | ICD-10-CM | POA: Insufficient documentation

## 2017-06-15 LAB — MYOCARDIAL PERFUSION IMAGING
CHL CUP RESTING HR STRESS: 65 {beats}/min
CSEPPHR: 79 {beats}/min
LVDIAVOL: 148 mL (ref 62–150)
LVSYSVOL: 75 mL
RATE: 0.29
SDS: 1
SRS: 2
SSS: 3
TID: 0.99

## 2017-06-15 MED ORDER — TECHNETIUM TC 99M TETROFOSMIN IV KIT
31.7000 | PACK | Freq: Once | INTRAVENOUS | Status: AC | PRN
  Administered 2017-06-15: 31.7 via INTRAVENOUS
  Filled 2017-06-15: qty 32

## 2017-06-15 MED ORDER — ADENOSINE (DIAGNOSTIC) 3 MG/ML IV SOLN
0.5600 mg/kg | Freq: Once | INTRAVENOUS | Status: AC
  Administered 2017-06-15: 33.6 mg via INTRAVENOUS

## 2017-06-15 MED ORDER — TECHNETIUM TC 99M TETROFOSMIN IV KIT
10.2000 | PACK | Freq: Once | INTRAVENOUS | Status: AC | PRN
  Administered 2017-06-15: 10.2 via INTRAVENOUS
  Filled 2017-06-15: qty 11

## 2017-06-18 ENCOUNTER — Telehealth: Payer: Self-pay

## 2017-06-18 DIAGNOSIS — E1122 Type 2 diabetes mellitus with diabetic chronic kidney disease: Secondary | ICD-10-CM | POA: Diagnosis not present

## 2017-06-18 DIAGNOSIS — Z8673 Personal history of transient ischemic attack (TIA), and cerebral infarction without residual deficits: Secondary | ICD-10-CM | POA: Diagnosis not present

## 2017-06-18 DIAGNOSIS — I130 Hypertensive heart and chronic kidney disease with heart failure and stage 1 through stage 4 chronic kidney disease, or unspecified chronic kidney disease: Secondary | ICD-10-CM | POA: Diagnosis not present

## 2017-06-18 DIAGNOSIS — T8141XA Infection following a procedure, superficial incisional surgical site, initial encounter: Secondary | ICD-10-CM | POA: Diagnosis not present

## 2017-06-18 DIAGNOSIS — D631 Anemia in chronic kidney disease: Secondary | ICD-10-CM | POA: Diagnosis not present

## 2017-06-18 DIAGNOSIS — R931 Abnormal findings on diagnostic imaging of heart and coronary circulation: Secondary | ICD-10-CM

## 2017-06-18 DIAGNOSIS — N184 Chronic kidney disease, stage 4 (severe): Secondary | ICD-10-CM | POA: Diagnosis not present

## 2017-06-18 DIAGNOSIS — E1151 Type 2 diabetes mellitus with diabetic peripheral angiopathy without gangrene: Secondary | ICD-10-CM | POA: Diagnosis not present

## 2017-06-18 DIAGNOSIS — E11610 Type 2 diabetes mellitus with diabetic neuropathic arthropathy: Secondary | ICD-10-CM | POA: Diagnosis not present

## 2017-06-18 DIAGNOSIS — N400 Enlarged prostate without lower urinary tract symptoms: Secondary | ICD-10-CM | POA: Diagnosis not present

## 2017-06-18 DIAGNOSIS — I5032 Chronic diastolic (congestive) heart failure: Secondary | ICD-10-CM | POA: Diagnosis not present

## 2017-06-18 NOTE — Telephone Encounter (Signed)
Appt is 06/19/17 at 10:15

## 2017-06-18 NOTE — Telephone Encounter (Signed)
-----   Message from Josue Hector, MD sent at 06/18/2017  8:16 AM EDT ----- Low risk myovue no ishcemia Low normal EF f/u echo in a year to [employer]

## 2017-06-18 NOTE — Telephone Encounter (Signed)
Sent results through [place]. Will place order for echo.

## 2017-06-19 ENCOUNTER — Ambulatory Visit (INDEPENDENT_AMBULATORY_CARE_PROVIDER_SITE_OTHER): Payer: Medicare Other | Admitting: Orthopedic Surgery

## 2017-06-19 ENCOUNTER — Encounter (INDEPENDENT_AMBULATORY_CARE_PROVIDER_SITE_OTHER): Payer: Self-pay | Admitting: Orthopedic Surgery

## 2017-06-19 VITALS — Ht 70.0 in | Wt 132.0 lb

## 2017-06-19 DIAGNOSIS — N400 Enlarged prostate without lower urinary tract symptoms: Secondary | ICD-10-CM | POA: Diagnosis not present

## 2017-06-19 DIAGNOSIS — I130 Hypertensive heart and chronic kidney disease with heart failure and stage 1 through stage 4 chronic kidney disease, or unspecified chronic kidney disease: Secondary | ICD-10-CM | POA: Diagnosis not present

## 2017-06-19 DIAGNOSIS — E11610 Type 2 diabetes mellitus with diabetic neuropathic arthropathy: Secondary | ICD-10-CM | POA: Diagnosis not present

## 2017-06-19 DIAGNOSIS — Z8673 Personal history of transient ischemic attack (TIA), and cerebral infarction without residual deficits: Secondary | ICD-10-CM | POA: Diagnosis not present

## 2017-06-19 DIAGNOSIS — D631 Anemia in chronic kidney disease: Secondary | ICD-10-CM | POA: Diagnosis not present

## 2017-06-19 DIAGNOSIS — N184 Chronic kidney disease, stage 4 (severe): Secondary | ICD-10-CM | POA: Diagnosis not present

## 2017-06-19 DIAGNOSIS — T8141XA Infection following a procedure, superficial incisional surgical site, initial encounter: Secondary | ICD-10-CM | POA: Diagnosis not present

## 2017-06-19 DIAGNOSIS — I5032 Chronic diastolic (congestive) heart failure: Secondary | ICD-10-CM | POA: Diagnosis not present

## 2017-06-19 DIAGNOSIS — M14671 Charcot's joint, right ankle and foot: Secondary | ICD-10-CM

## 2017-06-19 DIAGNOSIS — E1122 Type 2 diabetes mellitus with diabetic chronic kidney disease: Secondary | ICD-10-CM | POA: Diagnosis not present

## 2017-06-19 DIAGNOSIS — E1151 Type 2 diabetes mellitus with diabetic peripheral angiopathy without gangrene: Secondary | ICD-10-CM | POA: Diagnosis not present
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
## 2017-06-19 NOTE — Telephone Encounter (Signed)
Left full message on [name]'s vm:  Per Dr. [name], patient will cleanse the foot with Dial soap, apply gauze dressings self (or wife will do this). He is WBAT - ok for PT increase to 2 x week.

## 2017-06-19 NOTE — Telephone Encounter (Signed)
[name] PT with [facility] thought patient had appt yesterday but just wanted to go ahead and remind you to let her know the results from todays visit as far as status of wound and if patient is walking. CB # [phone]

## 2017-06-20 DIAGNOSIS — E11610 Type 2 diabetes mellitus with diabetic neuropathic arthropathy: Secondary | ICD-10-CM | POA: Diagnosis not present

## 2017-06-20 DIAGNOSIS — I5032 Chronic diastolic (congestive) heart failure: Secondary | ICD-10-CM | POA: Diagnosis not present

## 2017-06-20 DIAGNOSIS — I130 Hypertensive heart and chronic kidney disease with heart failure and stage 1 through stage 4 chronic kidney disease, or unspecified chronic kidney disease: Secondary | ICD-10-CM | POA: Diagnosis not present

## 2017-06-20 DIAGNOSIS — D631 Anemia in chronic kidney disease: Secondary | ICD-10-CM | POA: Diagnosis not present

## 2017-06-20 DIAGNOSIS — E1151 Type 2 diabetes mellitus with diabetic peripheral angiopathy without gangrene: Secondary | ICD-10-CM | POA: Diagnosis not present

## 2017-06-20 DIAGNOSIS — N400 Enlarged prostate without lower urinary tract symptoms: Secondary | ICD-10-CM | POA: Diagnosis not present

## 2017-06-20 DIAGNOSIS — T8141XA Infection following a procedure, superficial incisional surgical site, initial encounter: Secondary | ICD-10-CM | POA: Diagnosis not present

## 2017-06-20 DIAGNOSIS — E1122 Type 2 diabetes mellitus with diabetic chronic kidney disease: Secondary | ICD-10-CM | POA: Diagnosis not present

## 2017-06-20 DIAGNOSIS — Z8673 Personal history of transient ischemic attack (TIA), and cerebral infarction without residual deficits: Secondary | ICD-10-CM | POA: Diagnosis not present

## 2017-06-20 DIAGNOSIS — N184 Chronic kidney disease, stage 4 (severe): Secondary | ICD-10-CM | POA: Diagnosis not present

## 2017-06-21 DIAGNOSIS — I5032 Chronic diastolic (congestive) heart failure: Secondary | ICD-10-CM | POA: Diagnosis not present

## 2017-06-21 DIAGNOSIS — N184 Chronic kidney disease, stage 4 (severe): Secondary | ICD-10-CM | POA: Diagnosis not present

## 2017-06-21 DIAGNOSIS — T8141XA Infection following a procedure, superficial incisional surgical site, initial encounter: Secondary | ICD-10-CM | POA: Diagnosis not present

## 2017-06-21 DIAGNOSIS — I130 Hypertensive heart and chronic kidney disease with heart failure and stage 1 through stage 4 chronic kidney disease, or unspecified chronic kidney disease: Secondary | ICD-10-CM | POA: Diagnosis not present

## 2017-06-21 DIAGNOSIS — E1151 Type 2 diabetes mellitus with diabetic peripheral angiopathy without gangrene: Secondary | ICD-10-CM | POA: Diagnosis not present

## 2017-06-21 DIAGNOSIS — N400 Enlarged prostate without lower urinary tract symptoms: Secondary | ICD-10-CM | POA: Diagnosis not present

## 2017-06-21 DIAGNOSIS — E7800 Pure hypercholesterolemia, unspecified: Secondary | ICD-10-CM | POA: Diagnosis not present

## 2017-06-21 DIAGNOSIS — D631 Anemia in chronic kidney disease: Secondary | ICD-10-CM | POA: Diagnosis not present

## 2017-06-21 DIAGNOSIS — E11610 Type 2 diabetes mellitus with diabetic neuropathic arthropathy: Secondary | ICD-10-CM | POA: Diagnosis not present

## 2017-06-21 DIAGNOSIS — E1122 Type 2 diabetes mellitus with diabetic chronic kidney disease: Secondary | ICD-10-CM | POA: Diagnosis not present

## 2017-06-21 DIAGNOSIS — Z8673 Personal history of transient ischemic attack (TIA), and cerebral infarction without residual deficits: Secondary | ICD-10-CM | POA: Diagnosis not present

## 2017-06-25 DIAGNOSIS — N400 Enlarged prostate without lower urinary tract symptoms: Secondary | ICD-10-CM | POA: Diagnosis not present

## 2017-06-25 DIAGNOSIS — Z8673 Personal history of transient ischemic attack (TIA), and cerebral infarction without residual deficits: Secondary | ICD-10-CM | POA: Diagnosis not present

## 2017-06-25 DIAGNOSIS — D631 Anemia in chronic kidney disease: Secondary | ICD-10-CM | POA: Diagnosis not present

## 2017-06-25 DIAGNOSIS — T8141XA Infection following a procedure, superficial incisional surgical site, initial encounter: Secondary | ICD-10-CM | POA: Diagnosis not present

## 2017-06-25 DIAGNOSIS — I130 Hypertensive heart and chronic kidney disease with heart failure and stage 1 through stage 4 chronic kidney disease, or unspecified chronic kidney disease: Secondary | ICD-10-CM | POA: Diagnosis not present

## 2017-06-25 DIAGNOSIS — N184 Chronic kidney disease, stage 4 (severe): Secondary | ICD-10-CM | POA: Diagnosis not present

## 2017-06-25 DIAGNOSIS — E1122 Type 2 diabetes mellitus with diabetic chronic kidney disease: Secondary | ICD-10-CM | POA: Diagnosis not present

## 2017-06-25 DIAGNOSIS — E1151 Type 2 diabetes mellitus with diabetic peripheral angiopathy without gangrene: Secondary | ICD-10-CM | POA: Diagnosis not present

## 2017-06-25 DIAGNOSIS — I5032 Chronic diastolic (congestive) heart failure: Secondary | ICD-10-CM | POA: Diagnosis not present

## 2017-06-25 DIAGNOSIS — E11610 Type 2 diabetes mellitus with diabetic neuropathic arthropathy: Secondary | ICD-10-CM | POA: Diagnosis not present

## 2017-06-26 DIAGNOSIS — E11610 Type 2 diabetes mellitus with diabetic neuropathic arthropathy: Secondary | ICD-10-CM | POA: Diagnosis not present

## 2017-06-26 DIAGNOSIS — N184 Chronic kidney disease, stage 4 (severe): Secondary | ICD-10-CM | POA: Diagnosis not present

## 2017-06-26 DIAGNOSIS — N400 Enlarged prostate without lower urinary tract symptoms: Secondary | ICD-10-CM | POA: Diagnosis not present

## 2017-06-26 DIAGNOSIS — T8141XA Infection following a procedure, superficial incisional surgical site, initial encounter: Secondary | ICD-10-CM | POA: Diagnosis not present

## 2017-06-26 DIAGNOSIS — D631 Anemia in chronic kidney disease: Secondary | ICD-10-CM | POA: Diagnosis not present

## 2017-06-26 DIAGNOSIS — Z8673 Personal history of transient ischemic attack (TIA), and cerebral infarction without residual deficits: Secondary | ICD-10-CM | POA: Diagnosis not present

## 2017-06-26 DIAGNOSIS — E1122 Type 2 diabetes mellitus with diabetic chronic kidney disease: Secondary | ICD-10-CM | POA: Diagnosis not present

## 2017-06-26 DIAGNOSIS — E1151 Type 2 diabetes mellitus with diabetic peripheral angiopathy without gangrene: Secondary | ICD-10-CM | POA: Diagnosis not present

## 2017-06-26 DIAGNOSIS — I5032 Chronic diastolic (congestive) heart failure: Secondary | ICD-10-CM | POA: Diagnosis not present

## 2017-06-26 DIAGNOSIS — I130 Hypertensive heart and chronic kidney disease with heart failure and stage 1 through stage 4 chronic kidney disease, or unspecified chronic kidney disease: Secondary | ICD-10-CM | POA: Diagnosis not present

## 2017-06-27 ENCOUNTER — Ambulatory Visit (HOSPITAL_COMMUNITY)
Admission: RE | Admit: 2017-06-27 | Discharge: 2017-06-27 | Disposition: A | Discharge status: Home / Self Care | Payer: Medicare Other | Source: Ambulatory Visit | Attending: Nephrology | Admitting: Nephrology

## 2017-06-27 ENCOUNTER — Encounter (HOSPITAL_COMMUNITY): Payer: Medicare Other

## 2017-06-27 VITALS — BP 102/54 | HR 73 | Temp 97.8°F | Resp 20

## 2017-06-27 DIAGNOSIS — E1122 Type 2 diabetes mellitus with diabetic chronic kidney disease: Secondary | ICD-10-CM | POA: Diagnosis not present

## 2017-06-27 DIAGNOSIS — N184 Chronic kidney disease, stage 4 (severe): Secondary | ICD-10-CM | POA: Diagnosis not present

## 2017-06-27 DIAGNOSIS — Z79899 Other long term (current) drug therapy: Secondary | ICD-10-CM | POA: Diagnosis not present

## 2017-06-27 DIAGNOSIS — Z5181 Encounter for therapeutic drug level monitoring: Secondary | ICD-10-CM | POA: Diagnosis not present

## 2017-06-27 DIAGNOSIS — D631 Anemia in chronic kidney disease: Secondary | ICD-10-CM | POA: Insufficient documentation

## 2017-06-27 DIAGNOSIS — I129 Hypertensive chronic kidney disease with stage 1 through stage 4 chronic kidney disease, or unspecified chronic kidney disease: Secondary | ICD-10-CM | POA: Diagnosis not present

## 2017-06-27 DIAGNOSIS — N183 Chronic kidney disease, stage 3 unspecified: Secondary | ICD-10-CM

## 2017-06-27 DIAGNOSIS — N2581 Secondary hyperparathyroidism of renal origin: Secondary | ICD-10-CM | POA: Diagnosis not present

## 2017-06-27 LAB — IRON AND TIBC
IRON: 65 ug/dL (ref 45–182)
SATURATION RATIOS: 24 % (ref 17.9–39.5)
TIBC: 270 ug/dL (ref 250–450)
UIBC: 205 ug/dL

## 2017-06-27 LAB — POCT HEMOGLOBIN-HEMACUE: HEMOGLOBIN: 10.1 g/dL — AB (ref 13.0–17.0)

## 2017-06-27 MED ORDER — DARBEPOETIN ALFA 150 MCG/0.3ML IJ SOSY
150.0000 ug | PREFILLED_SYRINGE | INTRAMUSCULAR | Status: DC
  Administered 2017-06-27: 150 ug via SUBCUTANEOUS

## 2017-06-27 MED ORDER — DARBEPOETIN ALFA 150 MCG/0.3ML IJ SOSY
PREFILLED_SYRINGE | INTRAMUSCULAR | Status: AC
  Filled 2017-06-27: qty 0.3

## 2017-06-28 DIAGNOSIS — Z8673 Personal history of transient ischemic attack (TIA), and cerebral infarction without residual deficits: Secondary | ICD-10-CM | POA: Diagnosis not present

## 2017-06-28 DIAGNOSIS — N400 Enlarged prostate without lower urinary tract symptoms: Secondary | ICD-10-CM | POA: Diagnosis not present

## 2017-06-28 DIAGNOSIS — I130 Hypertensive heart and chronic kidney disease with heart failure and stage 1 through stage 4 chronic kidney disease, or unspecified chronic kidney disease: Secondary | ICD-10-CM | POA: Diagnosis not present

## 2017-06-28 DIAGNOSIS — E11610 Type 2 diabetes mellitus with diabetic neuropathic arthropathy: Secondary | ICD-10-CM | POA: Diagnosis not present

## 2017-06-28 DIAGNOSIS — E1122 Type 2 diabetes mellitus with diabetic chronic kidney disease: Secondary | ICD-10-CM | POA: Diagnosis not present

## 2017-06-28 DIAGNOSIS — D631 Anemia in chronic kidney disease: Secondary | ICD-10-CM | POA: Diagnosis not present

## 2017-06-28 DIAGNOSIS — I10 Essential (primary) hypertension: Secondary | ICD-10-CM | POA: Diagnosis not present

## 2017-06-28 DIAGNOSIS — E1151 Type 2 diabetes mellitus with diabetic peripheral angiopathy without gangrene: Secondary | ICD-10-CM | POA: Diagnosis not present

## 2017-06-28 DIAGNOSIS — E7800 Pure hypercholesterolemia, unspecified: Secondary | ICD-10-CM | POA: Diagnosis not present

## 2017-06-28 DIAGNOSIS — T8141XA Infection following a procedure, superficial incisional surgical site, initial encounter: Secondary | ICD-10-CM | POA: Diagnosis not present

## 2017-06-28 DIAGNOSIS — I2510 Atherosclerotic heart disease of native coronary artery without angina pectoris: Secondary | ICD-10-CM | POA: Diagnosis not present

## 2017-06-28 DIAGNOSIS — N184 Chronic kidney disease, stage 4 (severe): Secondary | ICD-10-CM | POA: Diagnosis not present

## 2017-06-28 DIAGNOSIS — I5032 Chronic diastolic (congestive) heart failure: Secondary | ICD-10-CM | POA: Diagnosis not present

## 2017-07-02 DIAGNOSIS — E1122 Type 2 diabetes mellitus with diabetic chronic kidney disease: Secondary | ICD-10-CM | POA: Diagnosis not present

## 2017-07-02 DIAGNOSIS — E11610 Type 2 diabetes mellitus with diabetic neuropathic arthropathy: Secondary | ICD-10-CM | POA: Diagnosis not present

## 2017-07-02 DIAGNOSIS — I5032 Chronic diastolic (congestive) heart failure: Secondary | ICD-10-CM | POA: Diagnosis not present

## 2017-07-02 DIAGNOSIS — E1151 Type 2 diabetes mellitus with diabetic peripheral angiopathy without gangrene: Secondary | ICD-10-CM | POA: Diagnosis not present

## 2017-07-02 DIAGNOSIS — N400 Enlarged prostate without lower urinary tract symptoms: Secondary | ICD-10-CM | POA: Diagnosis not present

## 2017-07-02 DIAGNOSIS — Z8673 Personal history of transient ischemic attack (TIA), and cerebral infarction without residual deficits: Secondary | ICD-10-CM | POA: Diagnosis not present

## 2017-07-02 DIAGNOSIS — R1031 Right lower quadrant pain: Secondary | ICD-10-CM | POA: Diagnosis not present

## 2017-07-02 DIAGNOSIS — D500 Iron deficiency anemia secondary to blood loss (chronic): Secondary | ICD-10-CM | POA: Diagnosis not present

## 2017-07-02 DIAGNOSIS — T8141XA Infection following a procedure, superficial incisional surgical site, initial encounter: Secondary | ICD-10-CM | POA: Diagnosis not present

## 2017-07-02 DIAGNOSIS — I130 Hypertensive heart and chronic kidney disease with heart failure and stage 1 through stage 4 chronic kidney disease, or unspecified chronic kidney disease: Secondary | ICD-10-CM | POA: Diagnosis not present

## 2017-07-02 DIAGNOSIS — D631 Anemia in chronic kidney disease: Secondary | ICD-10-CM | POA: Diagnosis not present

## 2017-07-02 DIAGNOSIS — N184 Chronic kidney disease, stage 4 (severe): Secondary | ICD-10-CM | POA: Diagnosis not present

## 2017-07-03 DIAGNOSIS — E1122 Type 2 diabetes mellitus with diabetic chronic kidney disease: Secondary | ICD-10-CM | POA: Diagnosis not present

## 2017-07-03 DIAGNOSIS — I130 Hypertensive heart and chronic kidney disease with heart failure and stage 1 through stage 4 chronic kidney disease, or unspecified chronic kidney disease: Secondary | ICD-10-CM | POA: Diagnosis not present

## 2017-07-03 DIAGNOSIS — N400 Enlarged prostate without lower urinary tract symptoms: Secondary | ICD-10-CM | POA: Diagnosis not present

## 2017-07-03 DIAGNOSIS — N184 Chronic kidney disease, stage 4 (severe): Secondary | ICD-10-CM | POA: Diagnosis not present

## 2017-07-03 DIAGNOSIS — Z8673 Personal history of transient ischemic attack (TIA), and cerebral infarction without residual deficits: Secondary | ICD-10-CM | POA: Diagnosis not present

## 2017-07-03 DIAGNOSIS — I5032 Chronic diastolic (congestive) heart failure: Secondary | ICD-10-CM | POA: Diagnosis not present

## 2017-07-03 DIAGNOSIS — T8141XA Infection following a procedure, superficial incisional surgical site, initial encounter: Secondary | ICD-10-CM | POA: Diagnosis not present

## 2017-07-03 DIAGNOSIS — E1151 Type 2 diabetes mellitus with diabetic peripheral angiopathy without gangrene: Secondary | ICD-10-CM | POA: Diagnosis not present

## 2017-07-03 DIAGNOSIS — E11610 Type 2 diabetes mellitus with diabetic neuropathic arthropathy: Secondary | ICD-10-CM | POA: Diagnosis not present

## 2017-07-03 DIAGNOSIS — D631 Anemia in chronic kidney disease: Secondary | ICD-10-CM | POA: Diagnosis not present

## 2017-07-04 DIAGNOSIS — T8141XA Infection following a procedure, superficial incisional surgical site, initial encounter: Secondary | ICD-10-CM | POA: Diagnosis not present

## 2017-07-04 DIAGNOSIS — Z8673 Personal history of transient ischemic attack (TIA), and cerebral infarction without residual deficits: Secondary | ICD-10-CM | POA: Diagnosis not present

## 2017-07-04 DIAGNOSIS — I130 Hypertensive heart and chronic kidney disease with heart failure and stage 1 through stage 4 chronic kidney disease, or unspecified chronic kidney disease: Secondary | ICD-10-CM | POA: Diagnosis not present

## 2017-07-04 DIAGNOSIS — N400 Enlarged prostate without lower urinary tract symptoms: Secondary | ICD-10-CM | POA: Diagnosis not present

## 2017-07-04 DIAGNOSIS — D631 Anemia in chronic kidney disease: Secondary | ICD-10-CM | POA: Diagnosis not present

## 2017-07-04 DIAGNOSIS — E11610 Type 2 diabetes mellitus with diabetic neuropathic arthropathy: Secondary | ICD-10-CM | POA: Diagnosis not present

## 2017-07-04 DIAGNOSIS — E1151 Type 2 diabetes mellitus with diabetic peripheral angiopathy without gangrene: Secondary | ICD-10-CM | POA: Diagnosis not present

## 2017-07-04 DIAGNOSIS — E1122 Type 2 diabetes mellitus with diabetic chronic kidney disease: Secondary | ICD-10-CM | POA: Diagnosis not present

## 2017-07-04 DIAGNOSIS — I5032 Chronic diastolic (congestive) heart failure: Secondary | ICD-10-CM | POA: Diagnosis not present

## 2017-07-04 DIAGNOSIS — N184 Chronic kidney disease, stage 4 (severe): Secondary | ICD-10-CM | POA: Diagnosis not present

## 2017-07-09 DIAGNOSIS — E11610 Type 2 diabetes mellitus with diabetic neuropathic arthropathy: Secondary | ICD-10-CM | POA: Diagnosis not present

## 2017-07-09 DIAGNOSIS — I130 Hypertensive heart and chronic kidney disease with heart failure and stage 1 through stage 4 chronic kidney disease, or unspecified chronic kidney disease: Secondary | ICD-10-CM | POA: Diagnosis not present

## 2017-07-09 DIAGNOSIS — E1151 Type 2 diabetes mellitus with diabetic peripheral angiopathy without gangrene: Secondary | ICD-10-CM | POA: Diagnosis not present

## 2017-07-09 DIAGNOSIS — I5032 Chronic diastolic (congestive) heart failure: Secondary | ICD-10-CM | POA: Diagnosis not present

## 2017-07-10 ENCOUNTER — Ambulatory Visit (INDEPENDENT_AMBULATORY_CARE_PROVIDER_SITE_OTHER): Payer: Medicare Other | Admitting: Orthopedic Surgery

## 2017-07-10 ENCOUNTER — Encounter (INDEPENDENT_AMBULATORY_CARE_PROVIDER_SITE_OTHER): Payer: Self-pay | Admitting: Orthopedic Surgery

## 2017-07-10 DIAGNOSIS — D631 Anemia in chronic kidney disease: Secondary | ICD-10-CM | POA: Diagnosis not present

## 2017-07-10 DIAGNOSIS — Z8673 Personal history of transient ischemic attack (TIA), and cerebral infarction without residual deficits: Secondary | ICD-10-CM | POA: Diagnosis not present

## 2017-07-10 DIAGNOSIS — T8141XA Infection following a procedure, superficial incisional surgical site, initial encounter: Secondary | ICD-10-CM | POA: Diagnosis not present

## 2017-07-10 DIAGNOSIS — M14671 Charcot's joint, right ankle and foot: Secondary | ICD-10-CM

## 2017-07-10 DIAGNOSIS — N184 Chronic kidney disease, stage 4 (severe): Secondary | ICD-10-CM | POA: Diagnosis not present

## 2017-07-10 DIAGNOSIS — I5032 Chronic diastolic (congestive) heart failure: Secondary | ICD-10-CM | POA: Diagnosis not present

## 2017-07-10 DIAGNOSIS — E1122 Type 2 diabetes mellitus with diabetic chronic kidney disease: Secondary | ICD-10-CM | POA: Diagnosis not present

## 2017-07-10 DIAGNOSIS — E11610 Type 2 diabetes mellitus with diabetic neuropathic arthropathy: Secondary | ICD-10-CM | POA: Diagnosis not present

## 2017-07-10 DIAGNOSIS — I130 Hypertensive heart and chronic kidney disease with heart failure and stage 1 through stage 4 chronic kidney disease, or unspecified chronic kidney disease: Secondary | ICD-10-CM | POA: Diagnosis not present

## 2017-07-10 DIAGNOSIS — L97511 Non-pressure chronic ulcer of other part of right foot limited to breakdown of skin: Secondary | ICD-10-CM

## 2017-07-10 DIAGNOSIS — N400 Enlarged prostate without lower urinary tract symptoms: Secondary | ICD-10-CM | POA: Diagnosis not present

## 2017-07-10 DIAGNOSIS — E1151 Type 2 diabetes mellitus with diabetic peripheral angiopathy without gangrene: Secondary | ICD-10-CM | POA: Diagnosis not present

## 2017-07-10 MED ORDER — NITROGLYCERIN 0.2 MG/HR TD PT24: 0.2000 mg | MEDICATED_PATCH | Freq: Every day | TRANSDERMAL | 12 refills | Status: DC

## 2017-07-10 MED ORDER — PENTOXIFYLLINE ER 400 MG PO TBCR: 400.0000 mg | EXTENDED_RELEASE_TABLET | Freq: Three times a day (TID) | ORAL | 3 refills | Status: DC
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
## 2017-07-11 ENCOUNTER — Other Ambulatory Visit: Payer: Self-pay | Admitting: Neurology

## 2017-07-11 DIAGNOSIS — D631 Anemia in chronic kidney disease: Secondary | ICD-10-CM | POA: Diagnosis not present

## 2017-07-11 DIAGNOSIS — N400 Enlarged prostate without lower urinary tract symptoms: Secondary | ICD-10-CM | POA: Diagnosis not present

## 2017-07-11 DIAGNOSIS — E1151 Type 2 diabetes mellitus with diabetic peripheral angiopathy without gangrene: Secondary | ICD-10-CM | POA: Diagnosis not present

## 2017-07-11 DIAGNOSIS — T8141XA Infection following a procedure, superficial incisional surgical site, initial encounter: Secondary | ICD-10-CM | POA: Diagnosis not present

## 2017-07-11 DIAGNOSIS — E11610 Type 2 diabetes mellitus with diabetic neuropathic arthropathy: Secondary | ICD-10-CM | POA: Diagnosis not present

## 2017-07-11 DIAGNOSIS — Z8673 Personal history of transient ischemic attack (TIA), and cerebral infarction without residual deficits: Secondary | ICD-10-CM | POA: Diagnosis not present

## 2017-07-11 DIAGNOSIS — E1122 Type 2 diabetes mellitus with diabetic chronic kidney disease: Secondary | ICD-10-CM | POA: Diagnosis not present

## 2017-07-11 DIAGNOSIS — I130 Hypertensive heart and chronic kidney disease with heart failure and stage 1 through stage 4 chronic kidney disease, or unspecified chronic kidney disease: Secondary | ICD-10-CM | POA: Diagnosis not present

## 2017-07-11 DIAGNOSIS — I5032 Chronic diastolic (congestive) heart failure: Secondary | ICD-10-CM | POA: Diagnosis not present

## 2017-07-11 DIAGNOSIS — N184 Chronic kidney disease, stage 4 (severe): Secondary | ICD-10-CM | POA: Diagnosis not present

## 2017-07-13 ENCOUNTER — Telehealth (INDEPENDENT_AMBULATORY_CARE_PROVIDER_SITE_OTHER): Payer: Self-pay | Admitting: Orthopedic Surgery

## 2017-07-13 DIAGNOSIS — N400 Enlarged prostate without lower urinary tract symptoms: Secondary | ICD-10-CM | POA: Diagnosis not present

## 2017-07-13 DIAGNOSIS — I5032 Chronic diastolic (congestive) heart failure: Secondary | ICD-10-CM | POA: Diagnosis not present

## 2017-07-13 DIAGNOSIS — E1151 Type 2 diabetes mellitus with diabetic peripheral angiopathy without gangrene: Secondary | ICD-10-CM | POA: Diagnosis not present

## 2017-07-13 DIAGNOSIS — E11610 Type 2 diabetes mellitus with diabetic neuropathic arthropathy: Secondary | ICD-10-CM | POA: Diagnosis not present

## 2017-07-13 DIAGNOSIS — D631 Anemia in chronic kidney disease: Secondary | ICD-10-CM | POA: Diagnosis not present

## 2017-07-13 DIAGNOSIS — N184 Chronic kidney disease, stage 4 (severe): Secondary | ICD-10-CM | POA: Diagnosis not present

## 2017-07-13 DIAGNOSIS — T8141XA Infection following a procedure, superficial incisional surgical site, initial encounter: Secondary | ICD-10-CM | POA: Diagnosis not present

## 2017-07-13 DIAGNOSIS — Z8673 Personal history of transient ischemic attack (TIA), and cerebral infarction without residual deficits: Secondary | ICD-10-CM | POA: Diagnosis not present

## 2017-07-13 DIAGNOSIS — I130 Hypertensive heart and chronic kidney disease with heart failure and stage 1 through stage 4 chronic kidney disease, or unspecified chronic kidney disease: Secondary | ICD-10-CM | POA: Diagnosis not present

## 2017-07-13 DIAGNOSIS — E1122 Type 2 diabetes mellitus with diabetic chronic kidney disease: Secondary | ICD-10-CM | POA: Diagnosis not present

## 2017-07-13 NOTE — Telephone Encounter (Signed)
PT needs extended orders 2x a week for 4 weeks.  Please call her to advise [phone] -Anda Kraft PT

## 2017-07-13 NOTE — Telephone Encounter (Signed)
Called to give verbal ok for extended orders with PT. Anda Kraft will cal with questions.

## 2017-07-17 DIAGNOSIS — K4090 Unilateral inguinal hernia, without obstruction or gangrene, not specified as recurrent: Secondary | ICD-10-CM | POA: Diagnosis not present

## 2017-07-17 DIAGNOSIS — I132 Hypertensive heart and chronic kidney disease with heart failure and with stage 5 chronic kidney disease, or end stage renal disease: Secondary | ICD-10-CM | POA: Diagnosis not present

## 2017-07-17 DIAGNOSIS — I5042 Chronic combined systolic (congestive) and diastolic (congestive) heart failure: Secondary | ICD-10-CM | POA: Diagnosis not present

## 2017-07-17 DIAGNOSIS — E11610 Type 2 diabetes mellitus with diabetic neuropathic arthropathy: Secondary | ICD-10-CM | POA: Diagnosis not present

## 2017-07-17 DIAGNOSIS — N400 Enlarged prostate without lower urinary tract symptoms: Secondary | ICD-10-CM | POA: Diagnosis not present

## 2017-07-17 DIAGNOSIS — T8141XD Infection following a procedure, superficial incisional surgical site, subsequent encounter: Secondary | ICD-10-CM | POA: Diagnosis not present

## 2017-07-17 DIAGNOSIS — E1122 Type 2 diabetes mellitus with diabetic chronic kidney disease: Secondary | ICD-10-CM | POA: Diagnosis not present

## 2017-07-17 DIAGNOSIS — N185 Chronic kidney disease, stage 5: Secondary | ICD-10-CM | POA: Diagnosis not present

## 2017-07-17 DIAGNOSIS — D631 Anemia in chronic kidney disease: Secondary | ICD-10-CM | POA: Diagnosis not present

## 2017-07-17 DIAGNOSIS — E1151 Type 2 diabetes mellitus with diabetic peripheral angiopathy without gangrene: Secondary | ICD-10-CM | POA: Diagnosis not present

## 2017-07-17 DIAGNOSIS — H409 Unspecified glaucoma: Secondary | ICD-10-CM | POA: Diagnosis not present

## 2017-07-19 DIAGNOSIS — D631 Anemia in chronic kidney disease: Secondary | ICD-10-CM | POA: Diagnosis not present

## 2017-07-19 DIAGNOSIS — E1151 Type 2 diabetes mellitus with diabetic peripheral angiopathy without gangrene: Secondary | ICD-10-CM | POA: Diagnosis not present

## 2017-07-19 DIAGNOSIS — T8141XD Infection following a procedure, superficial incisional surgical site, subsequent encounter: Secondary | ICD-10-CM | POA: Diagnosis not present

## 2017-07-19 DIAGNOSIS — H409 Unspecified glaucoma: Secondary | ICD-10-CM | POA: Diagnosis not present

## 2017-07-19 DIAGNOSIS — N400 Enlarged prostate without lower urinary tract symptoms: Secondary | ICD-10-CM | POA: Diagnosis not present

## 2017-07-19 DIAGNOSIS — N185 Chronic kidney disease, stage 5: Secondary | ICD-10-CM | POA: Diagnosis not present

## 2017-07-19 DIAGNOSIS — E11610 Type 2 diabetes mellitus with diabetic neuropathic arthropathy: Secondary | ICD-10-CM | POA: Diagnosis not present

## 2017-07-19 DIAGNOSIS — I5042 Chronic combined systolic (congestive) and diastolic (congestive) heart failure: Secondary | ICD-10-CM | POA: Diagnosis not present

## 2017-07-19 DIAGNOSIS — E1122 Type 2 diabetes mellitus with diabetic chronic kidney disease: Secondary | ICD-10-CM | POA: Diagnosis not present

## 2017-07-19 DIAGNOSIS — I132 Hypertensive heart and chronic kidney disease with heart failure and with stage 5 chronic kidney disease, or end stage renal disease: Secondary | ICD-10-CM | POA: Diagnosis not present

## 2017-07-24 ENCOUNTER — Encounter (INDEPENDENT_AMBULATORY_CARE_PROVIDER_SITE_OTHER): Payer: Self-pay | Admitting: Orthopedic Surgery

## 2017-07-24 ENCOUNTER — Ambulatory Visit (INDEPENDENT_AMBULATORY_CARE_PROVIDER_SITE_OTHER): Payer: Medicare Other | Admitting: Orthopedic Surgery

## 2017-07-24 DIAGNOSIS — I87321 Chronic venous hypertension (idiopathic) with inflammation of right lower extremity: Secondary | ICD-10-CM

## 2017-07-24 DIAGNOSIS — N185 Chronic kidney disease, stage 5: Secondary | ICD-10-CM | POA: Diagnosis not present

## 2017-07-24 DIAGNOSIS — T8141XD Infection following a procedure, superficial incisional surgical site, subsequent encounter: Secondary | ICD-10-CM | POA: Diagnosis not present

## 2017-07-24 DIAGNOSIS — I132 Hypertensive heart and chronic kidney disease with heart failure and with stage 5 chronic kidney disease, or end stage renal disease: Secondary | ICD-10-CM | POA: Diagnosis not present

## 2017-07-24 DIAGNOSIS — L97511 Non-pressure chronic ulcer of other part of right foot limited to breakdown of skin: Secondary | ICD-10-CM | POA: Diagnosis not present

## 2017-07-24 DIAGNOSIS — Z8673 Personal history of transient ischemic attack (TIA), and cerebral infarction without residual deficits: Secondary | ICD-10-CM | POA: Diagnosis not present

## 2017-07-24 DIAGNOSIS — Z87898 Personal history of other specified conditions: Secondary | ICD-10-CM | POA: Diagnosis not present

## 2017-07-24 DIAGNOSIS — I5042 Chronic combined systolic (congestive) and diastolic (congestive) heart failure: Secondary | ICD-10-CM | POA: Diagnosis not present

## 2017-07-24 DIAGNOSIS — E11610 Type 2 diabetes mellitus with diabetic neuropathic arthropathy: Secondary | ICD-10-CM | POA: Diagnosis not present

## 2017-07-24 DIAGNOSIS — N184 Chronic kidney disease, stage 4 (severe): Secondary | ICD-10-CM | POA: Diagnosis not present

## 2017-07-24 DIAGNOSIS — D631 Anemia in chronic kidney disease: Secondary | ICD-10-CM | POA: Diagnosis not present

## 2017-07-24 DIAGNOSIS — N400 Enlarged prostate without lower urinary tract symptoms: Secondary | ICD-10-CM | POA: Diagnosis not present

## 2017-07-24 DIAGNOSIS — E1151 Type 2 diabetes mellitus with diabetic peripheral angiopathy without gangrene: Secondary | ICD-10-CM | POA: Diagnosis not present

## 2017-07-24 DIAGNOSIS — H409 Unspecified glaucoma: Secondary | ICD-10-CM | POA: Diagnosis not present

## 2017-07-24 DIAGNOSIS — M14671 Charcot's joint, right ankle and foot: Secondary | ICD-10-CM

## 2017-07-24 DIAGNOSIS — E1122 Type 2 diabetes mellitus with diabetic chronic kidney disease: Secondary | ICD-10-CM | POA: Diagnosis not present
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
## 2017-07-25 ENCOUNTER — Ambulatory Visit (HOSPITAL_COMMUNITY)
Admission: RE | Admit: 2017-07-25 | Discharge: 2017-07-25 | Disposition: A | Discharge status: Home / Self Care | Payer: Medicare Other | Source: Ambulatory Visit | Attending: Nephrology | Admitting: Nephrology

## 2017-07-25 VITALS — BP 149/74 | HR 64 | Temp 98.1°F | Resp 20

## 2017-07-25 DIAGNOSIS — N184 Chronic kidney disease, stage 4 (severe): Secondary | ICD-10-CM | POA: Insufficient documentation

## 2017-07-25 DIAGNOSIS — N183 Chronic kidney disease, stage 3 unspecified: Secondary | ICD-10-CM

## 2017-07-25 DIAGNOSIS — D631 Anemia in chronic kidney disease: Secondary | ICD-10-CM | POA: Insufficient documentation

## 2017-07-25 LAB — IRON AND TIBC
Iron: 67 ug/dL (ref 45–182)
Saturation Ratios: 24 % (ref 17.9–39.5)
TIBC: 277 ug/dL (ref 250–450)
UIBC: 210 ug/dL

## 2017-07-25 LAB — POCT HEMOGLOBIN-HEMACUE: HEMOGLOBIN: 10.9 g/dL — AB (ref 13.0–17.0)

## 2017-07-25 LAB — FERRITIN: Ferritin: 42 ng/mL (ref 24–336)

## 2017-07-25 MED ORDER — DARBEPOETIN ALFA 150 MCG/0.3ML IJ SOSY
PREFILLED_SYRINGE | INTRAMUSCULAR | Status: AC
  Filled 2017-07-25: qty 0.3

## 2017-07-25 MED ORDER — DARBEPOETIN ALFA 150 MCG/0.3ML IJ SOSY
150.0000 ug | PREFILLED_SYRINGE | INTRAMUSCULAR | Status: DC
  Administered 2017-07-25: 10:00:00 150 ug via SUBCUTANEOUS

## 2017-07-26 DIAGNOSIS — D631 Anemia in chronic kidney disease: Secondary | ICD-10-CM | POA: Diagnosis not present

## 2017-07-26 DIAGNOSIS — H409 Unspecified glaucoma: Secondary | ICD-10-CM | POA: Diagnosis not present

## 2017-07-26 DIAGNOSIS — E11610 Type 2 diabetes mellitus with diabetic neuropathic arthropathy: Secondary | ICD-10-CM | POA: Diagnosis not present

## 2017-07-26 DIAGNOSIS — I5042 Chronic combined systolic (congestive) and diastolic (congestive) heart failure: Secondary | ICD-10-CM | POA: Diagnosis not present

## 2017-07-26 DIAGNOSIS — E1122 Type 2 diabetes mellitus with diabetic chronic kidney disease: Secondary | ICD-10-CM | POA: Diagnosis not present

## 2017-07-26 DIAGNOSIS — N185 Chronic kidney disease, stage 5: Secondary | ICD-10-CM | POA: Diagnosis not present

## 2017-07-26 DIAGNOSIS — E1151 Type 2 diabetes mellitus with diabetic peripheral angiopathy without gangrene: Secondary | ICD-10-CM | POA: Diagnosis not present

## 2017-07-26 DIAGNOSIS — T8141XD Infection following a procedure, superficial incisional surgical site, subsequent encounter: Secondary | ICD-10-CM | POA: Diagnosis not present

## 2017-07-26 DIAGNOSIS — I132 Hypertensive heart and chronic kidney disease with heart failure and with stage 5 chronic kidney disease, or end stage renal disease: Secondary | ICD-10-CM | POA: Diagnosis not present

## 2017-07-26 DIAGNOSIS — N400 Enlarged prostate without lower urinary tract symptoms: Secondary | ICD-10-CM | POA: Diagnosis not present

## 2017-07-30 ENCOUNTER — Other Ambulatory Visit: Payer: Self-pay | Admitting: Neurology

## 2017-07-30 DIAGNOSIS — I132 Hypertensive heart and chronic kidney disease with heart failure and with stage 5 chronic kidney disease, or end stage renal disease: Secondary | ICD-10-CM | POA: Diagnosis not present

## 2017-07-30 DIAGNOSIS — E1122 Type 2 diabetes mellitus with diabetic chronic kidney disease: Secondary | ICD-10-CM | POA: Diagnosis not present

## 2017-07-30 DIAGNOSIS — H409 Unspecified glaucoma: Secondary | ICD-10-CM | POA: Diagnosis not present

## 2017-07-30 DIAGNOSIS — T8141XD Infection following a procedure, superficial incisional surgical site, subsequent encounter: Secondary | ICD-10-CM | POA: Diagnosis not present

## 2017-07-30 DIAGNOSIS — N185 Chronic kidney disease, stage 5: Secondary | ICD-10-CM | POA: Diagnosis not present

## 2017-07-30 DIAGNOSIS — N400 Enlarged prostate without lower urinary tract symptoms: Secondary | ICD-10-CM | POA: Diagnosis not present

## 2017-07-30 DIAGNOSIS — E11610 Type 2 diabetes mellitus with diabetic neuropathic arthropathy: Secondary | ICD-10-CM | POA: Diagnosis not present

## 2017-07-30 DIAGNOSIS — I5042 Chronic combined systolic (congestive) and diastolic (congestive) heart failure: Secondary | ICD-10-CM | POA: Diagnosis not present

## 2017-07-30 DIAGNOSIS — D631 Anemia in chronic kidney disease: Secondary | ICD-10-CM | POA: Diagnosis not present

## 2017-07-30 DIAGNOSIS — E1151 Type 2 diabetes mellitus with diabetic peripheral angiopathy without gangrene: Secondary | ICD-10-CM | POA: Diagnosis not present

## 2017-07-31 ENCOUNTER — Telehealth: Payer: Self-pay

## 2017-07-31 DIAGNOSIS — K4090 Unilateral inguinal hernia, without obstruction or gangrene, not specified as recurrent: Secondary | ICD-10-CM | POA: Diagnosis not present

## 2017-07-31 NOTE — Telephone Encounter (Signed)
   [facility] Pre-operative Risk Assessment    Request for surgical clearance:  1. What type of surgery is being performed? Hernia Repair   2. When is this surgery scheduled? TBD   3. What type of clearance is required (medical clearance vs. Pharmacy clearance to hold med vs. Both)? Both  4. Are there any medications that need to be held prior to surgery and how long? Plavix 3 days prior    5. Practice name and name of physician performing surgery? [facility], Dr.James Wyatt  6. What is your office phone number [phone]    7.   What is your office fax number [phone] Attn: Carlene Coria   8.   Anesthesia type (None, local, MAC, general) ? General    Mendel Ryder 07/31/2017, 2:01 PM  _________________________________________________________________   (provider comments below)

## 2017-08-01 DIAGNOSIS — I132 Hypertensive heart and chronic kidney disease with heart failure and with stage 5 chronic kidney disease, or end stage renal disease: Secondary | ICD-10-CM | POA: Diagnosis not present

## 2017-08-01 DIAGNOSIS — E1151 Type 2 diabetes mellitus with diabetic peripheral angiopathy without gangrene: Secondary | ICD-10-CM | POA: Diagnosis not present

## 2017-08-01 DIAGNOSIS — N400 Enlarged prostate without lower urinary tract symptoms: Secondary | ICD-10-CM | POA: Diagnosis not present

## 2017-08-01 DIAGNOSIS — E11610 Type 2 diabetes mellitus with diabetic neuropathic arthropathy: Secondary | ICD-10-CM | POA: Diagnosis not present

## 2017-08-01 DIAGNOSIS — I5042 Chronic combined systolic (congestive) and diastolic (congestive) heart failure: Secondary | ICD-10-CM | POA: Diagnosis not present

## 2017-08-01 DIAGNOSIS — N185 Chronic kidney disease, stage 5: Secondary | ICD-10-CM | POA: Diagnosis not present

## 2017-08-01 DIAGNOSIS — E1122 Type 2 diabetes mellitus with diabetic chronic kidney disease: Secondary | ICD-10-CM | POA: Diagnosis not present

## 2017-08-01 DIAGNOSIS — T8141XD Infection following a procedure, superficial incisional surgical site, subsequent encounter: Secondary | ICD-10-CM | POA: Diagnosis not present

## 2017-08-01 DIAGNOSIS — H409 Unspecified glaucoma: Secondary | ICD-10-CM | POA: Diagnosis not present

## 2017-08-01 DIAGNOSIS — D631 Anemia in chronic kidney disease: Secondary | ICD-10-CM | POA: Diagnosis not present

## 2017-08-02 NOTE — Telephone Encounter (Signed)
Ok to hold plavix 3 days before surgery and ok for hernia repair

## 2017-08-02 NOTE — Telephone Encounter (Signed)
   Primary Cardiologist: Devin Rouge, MD  Chart reviewed as part of pre-operative protocol coverage. Patient was contacted 08/02/2017 in reference to pre-operative risk assessment for pending surgery as outlined below.  [name] was last seen on 05/18/17 by [name].  Since that day, [name] has done well.  He had a low risk nuclear medicine study 05/2017 and reports no new symptoms since that time. He can't complete 4.0 METS because he ambulates with a walker. He reports no change in his mobility or ability to do house chores and ADLs.  Per [name], [name] to hold plavix for three days prior to surgery and resume after surgery at surgeon's discretion.  Therefore, based on ACC/AHA guidelines, the patient would be at acceptable risk for the planned procedure without further cardiovascular testing.   I will route this recommendation to the requesting party via Epic fax function and remove from pre-op pool.  Please call with questions.  Devin Lin Lilley Hubble, PA 08/02/2017, 1:55 PM

## 2017-08-07 ENCOUNTER — Ambulatory Visit (INDEPENDENT_AMBULATORY_CARE_PROVIDER_SITE_OTHER): Payer: Medicare Other | Admitting: Orthopedic Surgery

## 2017-08-07 ENCOUNTER — Encounter (INDEPENDENT_AMBULATORY_CARE_PROVIDER_SITE_OTHER): Payer: Self-pay | Admitting: Orthopedic Surgery

## 2017-08-07 ENCOUNTER — Ambulatory Visit: Payer: Self-pay | Admitting: General Surgery

## 2017-08-07 VITALS — Ht 70.0 in | Wt 132.0 lb

## 2017-08-07 DIAGNOSIS — I132 Hypertensive heart and chronic kidney disease with heart failure and with stage 5 chronic kidney disease, or end stage renal disease: Secondary | ICD-10-CM | POA: Diagnosis not present

## 2017-08-07 DIAGNOSIS — N400 Enlarged prostate without lower urinary tract symptoms: Secondary | ICD-10-CM | POA: Diagnosis not present

## 2017-08-07 DIAGNOSIS — H409 Unspecified glaucoma: Secondary | ICD-10-CM | POA: Diagnosis not present

## 2017-08-07 DIAGNOSIS — I5042 Chronic combined systolic (congestive) and diastolic (congestive) heart failure: Secondary | ICD-10-CM | POA: Diagnosis not present

## 2017-08-07 DIAGNOSIS — E11610 Type 2 diabetes mellitus with diabetic neuropathic arthropathy: Secondary | ICD-10-CM | POA: Diagnosis not present

## 2017-08-07 DIAGNOSIS — E1151 Type 2 diabetes mellitus with diabetic peripheral angiopathy without gangrene: Secondary | ICD-10-CM | POA: Diagnosis not present

## 2017-08-07 DIAGNOSIS — N185 Chronic kidney disease, stage 5: Secondary | ICD-10-CM | POA: Diagnosis not present

## 2017-08-07 DIAGNOSIS — D631 Anemia in chronic kidney disease: Secondary | ICD-10-CM | POA: Diagnosis not present

## 2017-08-07 DIAGNOSIS — E1122 Type 2 diabetes mellitus with diabetic chronic kidney disease: Secondary | ICD-10-CM | POA: Diagnosis not present

## 2017-08-07 DIAGNOSIS — T8141XD Infection following a procedure, superficial incisional surgical site, subsequent encounter: Secondary | ICD-10-CM | POA: Diagnosis not present

## 2017-08-07 DIAGNOSIS — L97511 Non-pressure chronic ulcer of other part of right foot limited to breakdown of skin: Secondary | ICD-10-CM
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
## 2017-08-09 NOTE — Pre-Procedure Instructions (Signed)
[name]  08/09/2017      [facility] [address], [place] [address] Phone: [phone] Fax: [phone]  [place], [facility] [address] [place] [address] [place] [address] Phone: [phone] Fax: [phone]    Your procedure is scheduled on May 30  Report to [facility] at 0830 A.M.  Call this number if you have problems the morning of surgery:  [phone]   Remember:  NOTHING TO EAT OR DRINK AFTER MIDNIGHT    Take these medicines the morning of surgery with A SIP OF WATER  acetaminophen (TYLENOL)  carvedilol (COREG) lamoTRIgine (LAMICTAL) memantine (NAMENDA) venlafaxine XR (EFFEXOR-XR)  Eye drops   7 days prior to surgery STOP taking any Aspirin(unless otherwise instructed by your surgeon), Aleve, Naproxen, Ibuprofen, Motrin, Advil, Goody's, BC's, all herbal medications, fish oil, and all vitamins, pentoxifylline (TRENTAL)  FOLLOW PHYSICIANS INSTRUCTIONS ABOUT PLAVIX   WHAT DO I DO ABOUT MY DIABETES MEDICATION?   [name] Do not take oral diabetes medicines (pills) the morning of surgery.  . THE NIGHT BEFORE SURGERY, take _____2______ units of ___insulin detemir (LEVEMIR) ________insulin.      . If your CBG is greater than 220 mg/dL, you may take  of your sliding scale (correction) dose of insulin. insulin lispro (HUMALOG)    How to Manage Your Diabetes Before and After Surgery  Why is it important to control my blood sugar before and after surgery? . Improving blood sugar levels before and after surgery helps healing and can limit problems. . A way of improving blood sugar control is eating a healthy diet by: o  Eating less sugar and carbohydrates o  Increasing activity/exercise o  Talking with your doctor about reaching your blood sugar goals . High blood sugars (greater than 180 mg/dL) can raise your  risk of infections and slow your recovery, so you will need to focus on controlling your diabetes during the weeks before surgery. . Make sure that the doctor who takes care of your diabetes knows about your planned surgery including the date and location.  How do I manage my blood sugar before surgery? . Check your blood sugar at least 4 times a day, starting 2 days before surgery, to make sure that the level is not too high or low. o Check your blood sugar the morning of your surgery when you wake up and every 2 hours until you get to the Short Stay unit. . If your blood sugar is less than 70 mg/dL, you will need to treat for low blood sugar: o Do not take insulin. o Treat a low blood sugar (less than 70 mg/dL) with  cup of clear juice (cranberry or apple), 4 glucose tablets, OR glucose gel. o Recheck blood sugar in 15 minutes after treatment (to make sure it is greater than 70 mg/dL). If your blood sugar is not greater than 70 mg/dL on recheck, call [phone] for further instructions. . Report your blood sugar to the short stay nurse when you get to Short Stay.  . If you are admitted to the hospital after surgery: o Your blood sugar will be checked by the staff and you will probably be given insulin after surgery (instead of oral diabetes medicines) to make sure you have good blood sugar levels. o The goal for blood sugar control after surgery is 80-180 mg/dL.   Do  not wear jewelry, make-up or nail polish.  Do not wear lotions, powders, or perfumes, or deodorant.  Do not shave 48 hours prior to surgery.  Men may shave face and neck.  Do not bring valuables to the hospital.  [facility] is not responsible for any belongings or valuables.  Contacts, dentures or bridgework may not be worn into surgery.  Leave your suitcase in the car.  After surgery it may be brought to your room.  For patients admitted to the hospital, discharge time will be determined by your treatment team.  Patients  discharged the day of surgery will not be allowed to drive home.    Special instructions:   [place]- Preparing For Surgery  Before surgery, you can play an important role. Because skin is not sterile, your skin needs to be as free of germs as possible. You can reduce the number of germs on your skin by washing with CHG (chlorahexidine gluconate) Soap before surgery.  CHG is an antiseptic cleaner which kills germs and bonds with the skin to continue killing germs even after washing.    Oral Hygiene is also important to reduce your risk of infection.  Remember - BRUSH YOUR TEETH THE MORNING OF SURGERY WITH YOUR REGULAR TOOTHPASTE  Please do not use if you have an allergy to CHG or antibacterial soaps. If your skin becomes reddened/irritated stop using the CHG.  Do not shave (including legs and underarms) for at least 48 hours prior to first CHG shower. It is OK to shave your face.  Please follow these instructions carefully.   1. Shower the NIGHT BEFORE SURGERY and the MORNING OF SURGERY with CHG.   2. If you chose to wash your hair, wash your hair first as usual with your normal shampoo.  3. After you shampoo, rinse your hair and body thoroughly to remove the shampoo.  4. Use CHG as you would any other liquid soap. You can apply CHG directly to the skin and wash gently with a scrungie or a clean washcloth.   5. Apply the CHG Soap to your body ONLY FROM THE NECK DOWN.  Do not use on open wounds or open sores. Avoid contact with your eyes, ears, mouth and genitals (private parts). Wash Face and genitals (private parts)  with your normal soap.  6. Wash thoroughly, paying special attention to the area where your surgery will be performed.  7. Thoroughly rinse your body with warm water from the neck down.  8. DO NOT shower/wash with your normal soap after using and rinsing off the CHG Soap.  9. Pat yourself dry with a CLEAN TOWEL.  10. Wear CLEAN PAJAMAS to bed the night before  surgery, wear comfortable clothes the morning of surgery  11. Place CLEAN SHEETS on your bed the night of your first shower and DO NOT SLEEP WITH PETS.    Day of Surgery:  Do not apply any deodorants/lotions.  Please wear clean clothes to the hospital/surgery center.   Remember to brush your teeth WITH YOUR REGULAR TOOTHPASTE.    Please read over the following fact sheets that you were given.

## 2017-08-10 ENCOUNTER — Other Ambulatory Visit: Payer: Self-pay

## 2017-08-10 ENCOUNTER — Ambulatory Visit (HOSPITAL_COMMUNITY)
Admission: RE | Admit: 2017-08-10 | Discharge: 2017-08-10 | Disposition: A | Discharge status: Home / Self Care | Payer: Medicare Other | Source: Ambulatory Visit | Attending: General Surgery | Admitting: General Surgery

## 2017-08-10 ENCOUNTER — Encounter (HOSPITAL_COMMUNITY): Payer: Self-pay

## 2017-08-10 ENCOUNTER — Encounter (HOSPITAL_COMMUNITY)
Admission: RE | Admit: 2017-08-10 | Discharge: 2017-08-10 | Disposition: A | Discharge status: Home / Self Care | Payer: Medicare Other | Source: Ambulatory Visit | Attending: General Surgery | Admitting: General Surgery

## 2017-08-10 DIAGNOSIS — Z7902 Long term (current) use of antithrombotics/antiplatelets: Secondary | ICD-10-CM | POA: Diagnosis not present

## 2017-08-10 DIAGNOSIS — Z01818 Encounter for other preprocedural examination: Secondary | ICD-10-CM | POA: Insufficient documentation

## 2017-08-10 DIAGNOSIS — Z794 Long term (current) use of insulin: Secondary | ICD-10-CM | POA: Diagnosis not present

## 2017-08-10 DIAGNOSIS — E1151 Type 2 diabetes mellitus with diabetic peripheral angiopathy without gangrene: Secondary | ICD-10-CM | POA: Diagnosis not present

## 2017-08-10 DIAGNOSIS — I132 Hypertensive heart and chronic kidney disease with heart failure and with stage 5 chronic kidney disease, or end stage renal disease: Secondary | ICD-10-CM | POA: Diagnosis not present

## 2017-08-10 DIAGNOSIS — N400 Enlarged prostate without lower urinary tract symptoms: Secondary | ICD-10-CM | POA: Diagnosis not present

## 2017-08-10 DIAGNOSIS — N185 Chronic kidney disease, stage 5: Secondary | ICD-10-CM | POA: Diagnosis not present

## 2017-08-10 DIAGNOSIS — I5042 Chronic combined systolic (congestive) and diastolic (congestive) heart failure: Secondary | ICD-10-CM | POA: Diagnosis not present

## 2017-08-10 DIAGNOSIS — E11610 Type 2 diabetes mellitus with diabetic neuropathic arthropathy: Secondary | ICD-10-CM | POA: Diagnosis not present

## 2017-08-10 DIAGNOSIS — T8141XD Infection following a procedure, superficial incisional surgical site, subsequent encounter: Secondary | ICD-10-CM | POA: Diagnosis not present

## 2017-08-10 DIAGNOSIS — Z79899 Other long term (current) drug therapy: Secondary | ICD-10-CM | POA: Insufficient documentation

## 2017-08-10 DIAGNOSIS — R918 Other nonspecific abnormal finding of lung field: Secondary | ICD-10-CM | POA: Diagnosis not present

## 2017-08-10 DIAGNOSIS — D631 Anemia in chronic kidney disease: Secondary | ICD-10-CM | POA: Diagnosis not present

## 2017-08-10 DIAGNOSIS — K4090 Unilateral inguinal hernia, without obstruction or gangrene, not specified as recurrent: Secondary | ICD-10-CM | POA: Insufficient documentation

## 2017-08-10 DIAGNOSIS — H409 Unspecified glaucoma: Secondary | ICD-10-CM | POA: Diagnosis not present

## 2017-08-10 DIAGNOSIS — E1122 Type 2 diabetes mellitus with diabetic chronic kidney disease: Secondary | ICD-10-CM | POA: Diagnosis not present

## 2017-08-10 LAB — BASIC METABOLIC PANEL
Anion gap: 8 (ref 5–15)
BUN: 52 mg/dL — AB (ref 6–20)
CALCIUM: 9.3 mg/dL (ref 8.9–10.3)
CO2: 23 mmol/L (ref 22–32)
Chloride: 110 mmol/L (ref 101–111)
Creatinine, Ser: 2.66 mg/dL — ABNORMAL HIGH (ref 0.61–1.24)
GFR calc Af Amer: 26 mL/min — ABNORMAL LOW (ref >60)
GFR, EST NON AFRICAN AMERICAN: 23 mL/min — AB (ref >60)
GLUCOSE: 183 mg/dL — AB (ref 65–99)
POTASSIUM: 5 mmol/L (ref 3.5–5.1)
SODIUM: 141 mmol/L (ref 135–145)

## 2017-08-10 LAB — CBC WITH DIFFERENTIAL/PLATELET
Abs Immature Granulocytes: 0 10*3/uL (ref 0.0–0.1)
BASOS ABS: 0 10*3/uL (ref 0.0–0.1)
BASOS PCT: 1 %
EOS ABS: 0.2 10*3/uL (ref 0.0–0.7)
EOS PCT: 3 %
HCT: 40.2 % (ref 39.0–52.0)
Hemoglobin: 12.4 g/dL — ABNORMAL LOW (ref 13.0–17.0)
Immature Granulocytes: 0 %
LYMPHS PCT: 10 %
Lymphs Abs: 0.6 10*3/uL — ABNORMAL LOW (ref 0.7–4.0)
MCH: 28.6 pg (ref 26.0–34.0)
MCHC: 30.8 g/dL (ref 30.0–36.0)
MCV: 92.8 fL (ref 78.0–100.0)
MONO ABS: 0.4 10*3/uL (ref 0.1–1.0)
Monocytes Relative: 7 %
Neutro Abs: 4.5 10*3/uL (ref 1.7–7.7)
Neutrophils Relative %: 79 %
Platelets: 244 10*3/uL (ref 150–400)
RBC: 4.33 MIL/uL (ref 4.22–5.81)
RDW: 13.4 % (ref 11.5–15.5)
WBC: 5.7 10*3/uL (ref 4.0–10.5)

## 2017-08-10 LAB — HEMOGLOBIN A1C
HEMOGLOBIN A1C: 5.6 % (ref 4.8–5.6)
MEAN PLASMA GLUCOSE: 114.02 mg/dL

## 2017-08-10 LAB — GLUCOSE, CAPILLARY: GLUCOSE-CAPILLARY: 151 mg/dL — AB (ref 65–99)

## 2017-08-10 NOTE — Progress Notes (Signed)
PCP - Deland Pretty Cardiologist - [facility] Nephrologist- Dr. Lorrene Reid  Chest x-ray - 08/10/2017 with nipple markers EKG - 05/25/17 Stress Test - 2019 ECHO - 2019  Fasting Blood Sugar - 80-250 per patient, today was 151, A1c drawn today.  Checks Blood Sugar 4 times a day  Blood Thinner Instructions: Plavix- stop 3 days prior to surgery   Patient denies shortness of breath, fever, cough and chest pain at PAT appointment   Patient verbalized understanding of instructions that were given to them at the PAT appointment. Patient was also instructed that they will need to review over the PAT instructions again at home before surgery.
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
## 2017-08-15 DIAGNOSIS — H401133 Primary open-angle glaucoma, bilateral, severe stage: Secondary | ICD-10-CM | POA: Diagnosis not present

## 2017-08-16 ENCOUNTER — Ambulatory Visit (HOSPITAL_COMMUNITY): Payer: Medicare Other | Admitting: Emergency Medicine

## 2017-08-16 ENCOUNTER — Other Ambulatory Visit: Payer: Self-pay

## 2017-08-16 ENCOUNTER — Ambulatory Visit (HOSPITAL_COMMUNITY): Payer: Medicare Other | Admitting: Certified Registered Nurse Anesthetist

## 2017-08-16 ENCOUNTER — Ambulatory Visit (HOSPITAL_COMMUNITY)
Admission: RE | Admit: 2017-08-16 | Discharge: 2017-08-17 | Disposition: A | Discharge status: Home / Self Care | Payer: Medicare Other | Source: Ambulatory Visit | Attending: General Surgery | Admitting: General Surgery

## 2017-08-16 ENCOUNTER — Encounter (HOSPITAL_COMMUNITY): Payer: Self-pay | Admitting: Certified Registered Nurse Anesthetist

## 2017-08-16 ENCOUNTER — Encounter (HOSPITAL_COMMUNITY)
Admission: RE | Disposition: A | Discharge status: Home / Self Care | Payer: Self-pay | Source: Ambulatory Visit | Attending: General Surgery

## 2017-08-16 DIAGNOSIS — Z8249 Family history of ischemic heart disease and other diseases of the circulatory system: Secondary | ICD-10-CM | POA: Insufficient documentation

## 2017-08-16 DIAGNOSIS — Z794 Long term (current) use of insulin: Secondary | ICD-10-CM | POA: Diagnosis not present

## 2017-08-16 DIAGNOSIS — E1122 Type 2 diabetes mellitus with diabetic chronic kidney disease: Secondary | ICD-10-CM | POA: Insufficient documentation

## 2017-08-16 DIAGNOSIS — E7800 Pure hypercholesterolemia, unspecified: Secondary | ICD-10-CM | POA: Diagnosis not present

## 2017-08-16 DIAGNOSIS — Z79899 Other long term (current) drug therapy: Secondary | ICD-10-CM | POA: Insufficient documentation

## 2017-08-16 DIAGNOSIS — M1990 Unspecified osteoarthritis, unspecified site: Secondary | ICD-10-CM | POA: Insufficient documentation

## 2017-08-16 DIAGNOSIS — E1151 Type 2 diabetes mellitus with diabetic peripheral angiopathy without gangrene: Secondary | ICD-10-CM | POA: Diagnosis not present

## 2017-08-16 DIAGNOSIS — G40909 Epilepsy, unspecified, not intractable, without status epilepticus: Secondary | ICD-10-CM | POA: Insufficient documentation

## 2017-08-16 DIAGNOSIS — F329 Major depressive disorder, single episode, unspecified: Secondary | ICD-10-CM | POA: Diagnosis not present

## 2017-08-16 DIAGNOSIS — N400 Enlarged prostate without lower urinary tract symptoms: Secondary | ICD-10-CM | POA: Diagnosis not present

## 2017-08-16 DIAGNOSIS — Z8673 Personal history of transient ischemic attack (TIA), and cerebral infarction without residual deficits: Secondary | ICD-10-CM | POA: Diagnosis not present

## 2017-08-16 DIAGNOSIS — Z880 Allergy status to penicillin: Secondary | ICD-10-CM | POA: Insufficient documentation

## 2017-08-16 DIAGNOSIS — N183 Chronic kidney disease, stage 3 unspecified: Secondary | ICD-10-CM

## 2017-08-16 DIAGNOSIS — K4090 Unilateral inguinal hernia, without obstruction or gangrene, not specified as recurrent: Secondary | ICD-10-CM

## 2017-08-16 DIAGNOSIS — I130 Hypertensive heart and chronic kidney disease with heart failure and stage 1 through stage 4 chronic kidney disease, or unspecified chronic kidney disease: Secondary | ICD-10-CM | POA: Diagnosis not present

## 2017-08-16 DIAGNOSIS — I509 Heart failure, unspecified: Secondary | ICD-10-CM | POA: Diagnosis not present

## 2017-08-16 DIAGNOSIS — Z885 Allergy status to narcotic agent status: Secondary | ICD-10-CM | POA: Diagnosis not present

## 2017-08-16 DIAGNOSIS — Z7902 Long term (current) use of antithrombotics/antiplatelets: Secondary | ICD-10-CM | POA: Insufficient documentation

## 2017-08-16 DIAGNOSIS — N189 Chronic kidney disease, unspecified: Secondary | ICD-10-CM | POA: Diagnosis not present

## 2017-08-16 HISTORY — PX: INSERTION OF MESH: SHX5868

## 2017-08-16 HISTORY — PX: INGUINAL HERNIA REPAIR: SUR1180

## 2017-08-16 HISTORY — PX: INGUINAL HERNIA REPAIR: SHX194

## 2017-08-16 LAB — POCT I-STAT 4, (NA,K, GLUC, HGB,HCT)
GLUCOSE: 115 mg/dL — AB (ref 65–99)
HEMATOCRIT: 36 % — AB (ref 39.0–52.0)
HEMOGLOBIN: 12.2 g/dL — AB (ref 13.0–17.0)
POTASSIUM: 4.3 mmol/L (ref 3.5–5.1)
SODIUM: 141 mmol/L (ref 135–145)

## 2017-08-16 LAB — GLUCOSE, CAPILLARY
GLUCOSE-CAPILLARY: 108 mg/dL — AB (ref 65–99)
GLUCOSE-CAPILLARY: 139 mg/dL — AB (ref 65–99)
GLUCOSE-CAPILLARY: 227 mg/dL — AB (ref 65–99)
Glucose-Capillary: 134 mg/dL — ABNORMAL HIGH (ref 65–99)

## 2017-08-16 SURGERY — REPAIR, HERNIA, INGUINAL, ADULT
Anesthesia: General | Site: Inguinal | Laterality: Left

## 2017-08-16 MED ORDER — OXYCODONE HCL 5 MG/5ML PO SOLN: 5.0000 mg | Freq: Once | ORAL | Status: DC | PRN

## 2017-08-16 MED ORDER — VENLAFAXINE HCL ER 75 MG PO CP24
75.0000 mg | ORAL_CAPSULE | Freq: Every day | ORAL | Status: DC
  Administered 2017-08-16 – 2017-08-17 (×2): 75 mg via ORAL
  Filled 2017-08-16 (×2): qty 1

## 2017-08-16 MED ORDER — LIDOCAINE 2% (20 MG/ML) 5 ML SYRINGE
INTRAMUSCULAR | Status: AC
  Filled 2017-08-16: qty 5

## 2017-08-16 MED ORDER — TIMOLOL HEMIHYDRATE 0.5 % OP SOLN
1.0000 [drp] | Freq: Two times a day (BID) | OPHTHALMIC | Status: DC
  Filled 2017-08-16: qty 10

## 2017-08-16 MED ORDER — OXYCODONE HCL 5 MG PO TABS
5.0000 mg | ORAL_TABLET | ORAL | Status: DC | PRN
  Administered 2017-08-17: 5 mg via ORAL
  Filled 2017-08-16: qty 1

## 2017-08-16 MED ORDER — MEMANTINE HCL 10 MG PO TABS
10.0000 mg | ORAL_TABLET | Freq: Two times a day (BID) | ORAL | Status: DC
  Administered 2017-08-16 – 2017-08-17 (×2): 10 mg via ORAL
  Filled 2017-08-16 (×2): qty 1

## 2017-08-16 MED ORDER — ATORVASTATIN CALCIUM 20 MG PO TABS
20.0000 mg | ORAL_TABLET | Freq: Every evening | ORAL | Status: DC
  Administered 2017-08-16: 20 mg via ORAL
  Filled 2017-08-16: qty 1

## 2017-08-16 MED ORDER — DOCUSATE SODIUM 100 MG PO CAPS
100.0000 mg | ORAL_CAPSULE | Freq: Two times a day (BID) | ORAL | Status: DC
  Administered 2017-08-16 – 2017-08-17 (×2): 100 mg via ORAL
  Filled 2017-08-16 (×2): qty 1

## 2017-08-16 MED ORDER — CARVEDILOL 6.25 MG PO TABS
6.2500 mg | ORAL_TABLET | Freq: Two times a day (BID) | ORAL | Status: DC
  Administered 2017-08-16 – 2017-08-17 (×2): 6.25 mg via ORAL
  Filled 2017-08-16 (×2): qty 1

## 2017-08-16 MED ORDER — GABAPENTIN 300 MG PO CAPS
300.0000 mg | ORAL_CAPSULE | ORAL | Status: AC
  Administered 2017-08-16: 300 mg via ORAL
  Filled 2017-08-16: qty 1

## 2017-08-16 MED ORDER — TRAMADOL HCL 50 MG PO TABS: 50.0000 mg | ORAL_TABLET | Freq: Four times a day (QID) | ORAL | Status: DC | PRN

## 2017-08-16 MED ORDER — DEXAMETHASONE SODIUM PHOSPHATE 10 MG/ML IJ SOLN
INTRAMUSCULAR | Status: DC | PRN
  Administered 2017-08-16: 4 mg via INTRAVENOUS

## 2017-08-16 MED ORDER — OXYCODONE HCL 5 MG PO TABS: 5.0000 mg | ORAL_TABLET | Freq: Once | ORAL | Status: DC | PRN

## 2017-08-16 MED ORDER — POLYETHYLENE GLYCOL 3350 17 G PO PACK: 17.0000 g | PACK | Freq: Every day | ORAL | Status: DC | PRN

## 2017-08-16 MED ORDER — FUROSEMIDE 40 MG PO TABS
40.0000 mg | ORAL_TABLET | Freq: Every day | ORAL | Status: DC
  Administered 2017-08-16 – 2017-08-17 (×2): 40 mg via ORAL
  Filled 2017-08-16 (×2): qty 1

## 2017-08-16 MED ORDER — NITROGLYCERIN 0.2 MG/HR TD PT24
0.2000 mg | MEDICATED_PATCH | Freq: Every day | TRANSDERMAL | Status: DC
  Administered 2017-08-16: 0.2 mg via TRANSDERMAL
  Filled 2017-08-16 (×2): qty 1

## 2017-08-16 MED ORDER — VITAMIN C 500 MG PO TABS
500.0000 mg | ORAL_TABLET | Freq: Every day | ORAL | Status: DC
  Administered 2017-08-16 – 2017-08-17 (×2): 500 mg via ORAL
  Filled 2017-08-16 (×2): qty 1

## 2017-08-16 MED ORDER — HYDROMORPHONE HCL 2 MG/ML IJ SOLN
0.3000 mg | INTRAMUSCULAR | Status: DC | PRN
  Administered 2017-08-16: 0.5 mg via INTRAVENOUS

## 2017-08-16 MED ORDER — CHLORHEXIDINE GLUCONATE CLOTH 2 % EX PADS: 6.0000 | MEDICATED_PAD | Freq: Once | CUTANEOUS | Status: DC

## 2017-08-16 MED ORDER — TIMOLOL MALEATE 0.5 % OP SOLN
1.0000 [drp] | Freq: Two times a day (BID) | OPHTHALMIC | Status: DC
  Administered 2017-08-16: 1 [drp] via OPHTHALMIC
  Filled 2017-08-16: qty 5

## 2017-08-16 MED ORDER — ONDANSETRON HCL 4 MG/2ML IJ SOLN
INTRAMUSCULAR | Status: AC
  Filled 2017-08-16: qty 2

## 2017-08-16 MED ORDER — ONDANSETRON HCL 4 MG/2ML IJ SOLN
INTRAMUSCULAR | Status: DC | PRN
  Administered 2017-08-16: 4 mg via INTRAVENOUS

## 2017-08-16 MED ORDER — HYDROMORPHONE HCL 2 MG/ML IJ SOLN: 0.5000 mg | INTRAMUSCULAR | Status: DC | PRN

## 2017-08-16 MED ORDER — EPHEDRINE SULFATE-NACL 50-0.9 MG/10ML-% IV SOSY
PREFILLED_SYRINGE | INTRAVENOUS | Status: DC | PRN
  Administered 2017-08-16 (×2): 10 mg via INTRAVENOUS

## 2017-08-16 MED ORDER — INSULIN ASPART 100 UNIT/ML ~~LOC~~ SOLN
6.0000 [IU] | Freq: Three times a day (TID) | SUBCUTANEOUS | Status: DC
  Administered 2017-08-16: 6 [IU] via SUBCUTANEOUS

## 2017-08-16 MED ORDER — SUGAMMADEX SODIUM 200 MG/2ML IV SOLN
INTRAVENOUS | Status: DC | PRN
  Administered 2017-08-16: 120 mg via INTRAVENOUS

## 2017-08-16 MED ORDER — TAMSULOSIN HCL 0.4 MG PO CAPS
0.4000 mg | ORAL_CAPSULE | Freq: Every day | ORAL | Status: DC
  Administered 2017-08-16: 0.4 mg via ORAL
  Filled 2017-08-16: qty 1

## 2017-08-16 MED ORDER — 0.9 % SODIUM CHLORIDE (POUR BTL) OPTIME
TOPICAL | Status: DC | PRN
  Administered 2017-08-16: 1000 mL

## 2017-08-16 MED ORDER — HYDROMORPHONE HCL 2 MG/ML IJ SOLN
INTRAMUSCULAR | Status: AC
  Filled 2017-08-16: qty 1

## 2017-08-16 MED ORDER — PENTOXIFYLLINE ER 400 MG PO TBCR
400.0000 mg | EXTENDED_RELEASE_TABLET | Freq: Three times a day (TID) | ORAL | Status: DC
  Administered 2017-08-16 – 2017-08-17 (×2): 400 mg via ORAL
  Filled 2017-08-16 (×4): qty 1

## 2017-08-16 MED ORDER — FENTANYL CITRATE (PF) 250 MCG/5ML IJ SOLN
INTRAMUSCULAR | Status: AC
  Filled 2017-08-16: qty 5

## 2017-08-16 MED ORDER — BUPIVACAINE-EPINEPHRINE (PF) 0.5% -1:200000 IJ SOLN
INTRAMUSCULAR | Status: AC
  Filled 2017-08-16: qty 30

## 2017-08-16 MED ORDER — PROMETHAZINE HCL 25 MG/ML IJ SOLN: 6.2500 mg | INTRAMUSCULAR | Status: DC | PRN

## 2017-08-16 MED ORDER — SODIUM CHLORIDE 0.9 % IV SOLN
INTRAVENOUS | Status: DC
  Administered 2017-08-16: 09:00:00 via INTRAVENOUS

## 2017-08-16 MED ORDER — ENOXAPARIN SODIUM 40 MG/0.4ML ~~LOC~~ SOLN: 40.0000 mg | SUBCUTANEOUS | Status: DC

## 2017-08-16 MED ORDER — SODIUM CHLORIDE 0.9 % IV SOLN
INTRAVENOUS | Status: AC
  Filled 2017-08-16: qty 500000

## 2017-08-16 MED ORDER — ONDANSETRON 4 MG PO TBDP: 4.0000 mg | ORAL_TABLET | Freq: Four times a day (QID) | ORAL | Status: DC | PRN

## 2017-08-16 MED ORDER — CIPROFLOXACIN IN D5W 400 MG/200ML IV SOLN
400.0000 mg | INTRAVENOUS | Status: AC
  Administered 2017-08-16: 400 mg via INTRAVENOUS
  Filled 2017-08-16: qty 200

## 2017-08-16 MED ORDER — PROPOFOL 10 MG/ML IV BOLUS
INTRAVENOUS | Status: AC
  Filled 2017-08-16: qty 20

## 2017-08-16 MED ORDER — GABAPENTIN 400 MG PO CAPS
400.0000 mg | ORAL_CAPSULE | Freq: Every day | ORAL | Status: DC
  Administered 2017-08-16: 400 mg via ORAL
  Filled 2017-08-16: qty 1

## 2017-08-16 MED ORDER — ACETAMINOPHEN 500 MG PO TABS: 500.0000 mg | ORAL_TABLET | Freq: Two times a day (BID) | ORAL | Status: DC | PRN

## 2017-08-16 MED ORDER — ROCURONIUM BROMIDE 10 MG/ML (PF) SYRINGE
PREFILLED_SYRINGE | INTRAVENOUS | Status: DC | PRN
  Administered 2017-08-16: 50 mg via INTRAVENOUS

## 2017-08-16 MED ORDER — ISOSORB DINITRATE-HYDRALAZINE 20-37.5 MG PO TABS
1.0000 | ORAL_TABLET | Freq: Two times a day (BID) | ORAL | Status: DC
  Administered 2017-08-16 – 2017-08-17 (×2): 1 via ORAL
  Filled 2017-08-16 (×2): qty 1

## 2017-08-16 MED ORDER — INSULIN DETEMIR 100 UNIT/ML ~~LOC~~ SOLN
5.0000 [IU] | Freq: Every day | SUBCUTANEOUS | Status: DC
  Administered 2017-08-16: 5 [IU] via SUBCUTANEOUS
  Filled 2017-08-16: qty 0.05

## 2017-08-16 MED ORDER — PROPOFOL 10 MG/ML IV BOLUS
INTRAVENOUS | Status: DC | PRN
  Administered 2017-08-16: 150 mg via INTRAVENOUS

## 2017-08-16 MED ORDER — PHENYLEPHRINE HCL 10 MG/ML IJ SOLN
INTRAVENOUS | Status: DC | PRN
  Administered 2017-08-16: 45 ug/min via INTRAVENOUS

## 2017-08-16 MED ORDER — DEXAMETHASONE SODIUM PHOSPHATE 10 MG/ML IJ SOLN
INTRAMUSCULAR | Status: AC
  Filled 2017-08-16: qty 1

## 2017-08-16 MED ORDER — SODIUM CHLORIDE 0.9 % IV SOLN
INTRAVENOUS | Status: DC | PRN
  Administered 2017-08-16: 500 mL

## 2017-08-16 MED ORDER — MEPERIDINE HCL 50 MG/ML IJ SOLN: 6.2500 mg | INTRAMUSCULAR | Status: DC | PRN

## 2017-08-16 MED ORDER — SODIUM CHLORIDE 0.9 % IV SOLN
INTRAVENOUS | Status: DC
  Administered 2017-08-16: 19:00:00 via INTRAVENOUS

## 2017-08-16 MED ORDER — ACETAMINOPHEN 500 MG PO TABS
1000.0000 mg | ORAL_TABLET | ORAL | Status: AC
  Administered 2017-08-16: 1000 mg via ORAL
  Filled 2017-08-16: qty 2

## 2017-08-16 MED ORDER — FENTANYL CITRATE (PF) 250 MCG/5ML IJ SOLN
INTRAMUSCULAR | Status: DC | PRN
  Administered 2017-08-16: 100 ug via INTRAVENOUS

## 2017-08-16 MED ORDER — ADULT MULTIVITAMIN W/MINERALS CH
ORAL_TABLET | Freq: Every day | ORAL | Status: DC
  Administered 2017-08-16 – 2017-08-17 (×2): 1 via ORAL
  Filled 2017-08-16 (×2): qty 1

## 2017-08-16 MED ORDER — GABAPENTIN 300 MG PO CAPS
300.0000 mg | ORAL_CAPSULE | Freq: Every day | ORAL | Status: DC
  Administered 2017-08-17: 300 mg via ORAL
  Filled 2017-08-16: qty 1

## 2017-08-16 MED ORDER — ONDANSETRON HCL 4 MG/2ML IJ SOLN: 4.0000 mg | Freq: Four times a day (QID) | INTRAMUSCULAR | Status: DC | PRN

## 2017-08-16 MED ORDER — LAMOTRIGINE 100 MG PO TABS
100.0000 mg | ORAL_TABLET | Freq: Two times a day (BID) | ORAL | Status: DC
  Administered 2017-08-16 – 2017-08-17 (×2): 100 mg via ORAL
  Filled 2017-08-16 (×2): qty 1

## 2017-08-16 MED ORDER — BUPIVACAINE-EPINEPHRINE 0.5% -1:200000 IJ SOLN
INTRAMUSCULAR | Status: DC | PRN
  Administered 2017-08-16: 15 mL

## 2017-08-16 MED ORDER — LIDOCAINE 2% (20 MG/ML) 5 ML SYRINGE
INTRAMUSCULAR | Status: DC | PRN
  Administered 2017-08-16: 80 mg via INTRAVENOUS

## 2017-08-16 SURGICAL SUPPLY — 43 items
ADH SKN CLS APL DERMABOND .7 (GAUZE/BANDAGES/DRESSINGS) ×1
BAG DECANTER FOR FLEXI CONT (MISCELLANEOUS) ×3 IMPLANT
BLADE CLIPPER SURG (BLADE) ×3 IMPLANT
CANISTER SUCT 3000ML PPV (MISCELLANEOUS) ×3 IMPLANT
CHLORAPREP W/TINT 26ML (MISCELLANEOUS) ×3 IMPLANT
CLOSURE WOUND 1/2 X4 (GAUZE/BANDAGES/DRESSINGS) ×1
COVER SURGICAL LIGHT HANDLE (MISCELLANEOUS) ×3 IMPLANT
DERMABOND ADVANCED (GAUZE/BANDAGES/DRESSINGS) ×2
DERMABOND ADVANCED .7 DNX12 (GAUZE/BANDAGES/DRESSINGS) ×1 IMPLANT
DRAIN PENROSE 1/2X12 LTX STRL (WOUND CARE) ×3 IMPLANT
DRAPE LAPAROTOMY TRNSV 102X78 (DRAPE) ×3 IMPLANT
DRSG TEGADERM 4X4.75 (GAUZE/BANDAGES/DRESSINGS) ×3 IMPLANT
ELECT REM PT RETURN 9FT ADLT (ELECTROSURGICAL) ×3
ELECTRODE REM PT RTRN 9FT ADLT (ELECTROSURGICAL) ×1 IMPLANT
GLOVE BIO SURGEON STRL SZ7 (GLOVE) ×3 IMPLANT
GLOVE BIOGEL PI IND STRL 6.5 (GLOVE) ×1 IMPLANT
GLOVE BIOGEL PI IND STRL 7.0 (GLOVE) ×1 IMPLANT
GLOVE BIOGEL PI IND STRL 8 (GLOVE) ×1 IMPLANT
GLOVE BIOGEL PI INDICATOR 6.5 (GLOVE) ×2
GLOVE BIOGEL PI INDICATOR 7.0 (GLOVE) ×2
GLOVE BIOGEL PI INDICATOR 8 (GLOVE) ×2
GLOVE ECLIPSE 7.5 STRL STRAW (GLOVE) ×3 IMPLANT
GLOVE SURG SS PI 6.5 STRL IVOR (GLOVE) ×6 IMPLANT
GOWN STRL REUS W/ TWL LRG LVL3 (GOWN DISPOSABLE) ×3 IMPLANT
GOWN STRL REUS W/TWL LRG LVL3 (GOWN DISPOSABLE) ×9
KIT BASIN OR (CUSTOM PROCEDURE TRAY) ×3 IMPLANT
KIT TURNOVER KIT B (KITS) ×3 IMPLANT
MESH HERNIA 3X6 (Mesh General) ×3 IMPLANT
NEEDLE HYPO 25GX1X1/2 BEV (NEEDLE) ×3 IMPLANT
NS IRRIG 1000ML POUR BTL (IV SOLUTION) ×3 IMPLANT
PACK GENERAL/GYN (CUSTOM PROCEDURE TRAY) ×3 IMPLANT
PAD ARMBOARD 7.5X6 YLW CONV (MISCELLANEOUS) ×3 IMPLANT
SPECIMEN JAR SMALL (MISCELLANEOUS) IMPLANT
SPONGE INTESTINAL PEANUT (DISPOSABLE) ×3 IMPLANT
STRIP CLOSURE SKIN 1/2X4 (GAUZE/BANDAGES/DRESSINGS) ×2 IMPLANT
SUT ETHIBOND 0 MO6 C/R (SUTURE) ×3 IMPLANT
SUT MON AB 4-0 PC3 18 (SUTURE) ×3 IMPLANT
SUT PROLENE 0 CT 2 (SUTURE) ×6 IMPLANT
SUT VIC AB 3-0 SH 27 (SUTURE) ×6
SUT VIC AB 3-0 SH 27X BRD (SUTURE) ×2 IMPLANT
SUT VICRYL AB 3 0 TIES (SUTURE) ×3 IMPLANT
SYR CONTROL 10ML LL (SYRINGE) ×3 IMPLANT
TOWEL GREEN STERILE FF (TOWEL DISPOSABLE) ×3 IMPLANT

## 2017-08-16 NOTE — Anesthesia Procedure Notes (Signed)
Procedure Name: Intubation Date/Time: 08/16/2017 10:24 AM Performed by: Julieta Bellini, CRNA Pre-anesthesia Checklist: Patient identified, Emergency Drugs available, Suction available and Patient being monitored Patient Re-evaluated:Patient Re-evaluated prior to induction Oxygen Delivery Method: Circle system utilized Preoxygenation: Pre-oxygenation with 100% oxygen Induction Type: IV induction Ventilation: Mask ventilation without difficulty Laryngoscope Size: Mac and 4 Grade View: Grade I Tube type: Oral Tube size: 7.5 mm Number of attempts: 1 Airway Equipment and Method: Stylet Placement Confirmation: ETT inserted through vocal cords under direct vision,  positive ETCO2 and breath sounds checked- equal and bilateral Secured at: 23 cm Tube secured with: Tape Dental Injury: Teeth and Oropharynx as per pre-operative assessment

## 2017-08-16 NOTE — Transfer of Care (Signed)
Immediate Anesthesia Transfer of Care Note  Patient: [name]  Procedure(s) Performed: LEFT HERNIA REPAIR INGUINAL ADULT WITH MESH (Left Inguinal) INSERTION OF MESH (Left Inguinal)  Patient Location: PACU  Anesthesia Type:General  Level of Consciousness: drowsy and responds to stimulation  Airway & Oxygen Therapy: Patient Spontanous Breathing and Patient connected to nasal cannula oxygen  Post-op Assessment: Report given to RN and Post -op Vital signs reviewed and stable  Post vital signs: Reviewed and stable  Last Vitals:  Vitals Value Taken Time  BP 150/58 08/16/2017 11:45 AM  Temp    Pulse 52 08/16/2017 11:51 AM  Resp 5 08/16/2017 11:51 AM  SpO2 99 % 08/16/2017 11:51 AM  Vitals shown include unvalidated device data.  Last Pain:  Vitals:   08/16/17 1145  TempSrc:   PainSc: (P) Asleep      Patients Stated Pain Goal: 0 (66/81/59 [id number])  Complications: No apparent anesthesia complications

## 2017-08-16 NOTE — Op Note (Signed)
OPERATIVE REPORT  DATE OF OPERATION: 08/16/2017  PATIENT:  [name]  72 y.o. male  PRE-OPERATIVE DIAGNOSIS:  left inguinal hernia  POST-OPERATIVE DIAGNOSIS:  left inguinal hernia, direct  INDICATION(S) FOR OPERATION:  Symptomatic left inguinal hernia  FINDINGS:  DIrect sac medial to the inferior epigastric vessel  PROCEDURE:  Procedure(s): LEFT HERNIA REPAIR INGUINAL ADULT WITH MESH INSERTION OF MESH  SURGEON:  Surgeon(s): Judeth Horn, MD  ASSISTANT: None  ANESTHESIA:   general  COMPLICATIONS:  None  EBL: 20 ml  BLOOD ADMINISTERED: none  DRAINS: none   SPECIMEN:  No Specimen  COUNTS CORRECT:  YES  PROCEDURE DETAILS: The patient was taken to the operating room and placed on table in supine position.  After adequate general endotracheal anesthetic was administered, he was prepped and draped in usual sterile manner  A proper timeout was performed identifying the patient and the procedure to be performed.  We marked the area of the incision that was between the anterior superior iliac spine and the pubic tubercle.  A transverse curvilinear incision was made using a #10 blade taken down into the subcutaneous tissue down to the external oblique fascia using electrocautery.  Once we were at the external oblique fascia we opened that up through the superficial ring, identified the spermatic cord, mobilized spermatic cord at the pubic tubercle and then dissected out the hernia sac.  The hernia sac was medial and superior and as we dissected down to what appeared to be more of a broad-based direct sac than an indirect sac.  It was coming out medial to the inferior epigastric vessel.  We did ligate the sac on itself using 2 stick ties of 0 Ethibond suture.  Once this was done we then repaired the floor using an oval piece of polypropylene mesh measuring 4 x 2 cm in size retracted to the reflected portion of the inguinal ligament inferolaterally and the conjoined tendon anterior  medially using a running 0 Prolene suture.  The mesh was then soaked in antibiotic solution which we irrigated with at the end.  We subsequently closed the external oblique fascia on top of the spermatic cord using running 3-0 Vicryl suture.  Scarpa's fascia was reapproximated using interrupted 3-0 Vicryl and the skin was closed using a running subcuticular stitch of 4-0 Monocryl.  0.25% Marcaine with epinephrine was injected into the subcutaneous tissue.  All needle counts, sponge counts, and instrument counts were correct.  PATIENT DISPOSITION:  PACU - hemodynamically stable.   Judeth Horn 5/30/201911:37 AM

## 2017-08-16 NOTE — Anesthesia Postprocedure Evaluation (Signed)
Anesthesia Post Note  Patient: AYAZ SONDGEROTH  Procedure(s) Performed: LEFT HERNIA REPAIR INGUINAL ADULT WITH MESH (Left Inguinal) INSERTION OF MESH (Left Inguinal)     Patient location during evaluation: PACU Anesthesia Type: General Level of consciousness: awake and alert Pain management: pain level controlled Vital Signs Assessment: post-procedure vital signs reviewed and stable Respiratory status: spontaneous breathing, nonlabored ventilation and respiratory function stable Cardiovascular status: blood pressure returned to baseline and stable Postop Assessment: no apparent nausea or vomiting Anesthetic complications: no    Last Vitals:  Vitals:   08/16/17 1230 08/16/17 1245  BP: (!) 107/54 (!) 102/51  Pulse: (!) 52 (!) 52  Resp: (!) 6 (!) 8  Temp:    SpO2: 98% 99%    Last Pain:  Vitals:   08/16/17 1230  TempSrc:   PainSc: [place]

## 2017-08-16 NOTE — H&P (Signed)
[name] Documented: 07/31/2017 9:10 AM Location: [facility] Patient #: [id number] DOB: 21-Oct-1945 Married / Language: English / Race: Black or African American Male   History of Present Illness [name]. Hulen Skains MD; 07/31/2017 9:34 AM) The patient is a 72 year old male who presents with an inguinal hernia. The hernia(s) is/are located on the left side. Symptoms include inguinal bulge and inguinal pain (More disconfort than pain). The pain is located in the left inguinal area. Onset was 3 week(s) ago. The patient describes this as unchanged. Not symmptomatic. But patient wants to have surgery. Has significant cardiac history. Needs clearance.  Patient has gotten cardiac clearance and we are ready to go on with surgery   Past Surgical History [name], CMA; 07/31/2017 9:10 AM) Cataract Surgery  Bilateral. Colon Removal - Partial  Dialysis Shunt / Fistula  Foot Surgery  Left. Hip Surgery  Left. TURP   Diagnostic Studies History [name], CMA; 07/31/2017 9:10 AM) Colonoscopy  5-10 years ago  Allergies [name], CMA; 07/31/2017 9:11 AM) Penicillin G Pot in Dextrose *PENICILLINS*  Codeine Phosphate *ANALGESICS - OPIOID*   Medication History ([name], [place]; 07/31/2017 9:15 AM) Furosemide (10MG /ML Solution, Oral) Active. Levemir (100UNIT/ML Solution, Subcutaneous) Active. Vitamin C (500MG  Capsule, Oral) Active. BiDil (20-37.5MG  Tablet, Oral) Active. Carvedilol (6.25MG  Tablet, Oral) Active. LaMICtal (100MG  Tablet, Oral) Active. Atorvastatin Calcium (20MG  Tablet, Oral) Active. Clopidogrel Bisulfate (75MG  Tablet, Oral) Active. Tamsulosin HCl (0.4MG  Capsule, Oral) Active. HumaLOG (100UNIT/ML Solution, Subcutaneous) Active. Colace (100MG  Capsule, Oral) Active. Memantine HCl ER (7MG  Capsule ER 24HR, Oral) Active. MiraLax (Oral) Active. Timolol Maleate (0.5% Solution, Ophthalmic) Active. [name] Z (0.004%  Solution, Ophthalmic) Active. Multivital Platinum Silver (Oral) Active. Gabapentin (100MG  Capsule, Oral) Active. OneTouch Delica Lancets 74Q Active. Medications Reconciled  Social History [name], CMA; 07/31/2017 9:10 AM) Alcohol use  Remotely quit alcohol use. Caffeine use  Coffee, Tea. No drug use  Tobacco use  Never smoker.  Family History [name], CMA; 07/31/2017 9:10 AM) Arthritis  Mother. Diabetes Mellitus  Mother, Sister. Heart Disease  Mother. Hypertension  Mother, Sister.  Other Problems [name], CMA; 07/31/2017 9:10 AM) Arthritis  Cerebrovascular Accident  Chronic Renal Failure Syndrome  Congestive Heart Failure  Depression  Diabetes Mellitus  Enlarged Prostate  High blood pressure  Hypercholesterolemia  Seizure Disorder     Review of Systems [name] CMA; 07/31/2017 9:10 AM) General Not Present- Appetite Loss, Chills, Fatigue, Fever, Night Sweats, Weight Gain and Weight Loss. Skin Present- Non-Healing Wounds. Not Present- Change in Wart/Mole, Dryness, Hives, Jaundice, New Lesions, Rash and Ulcer. HEENT Present- Wears glasses/contact lenses. Not Present- Earache, Hearing Loss, Hoarseness, Nose Bleed, Oral Ulcers, Ringing in the Ears, Seasonal Allergies, Sinus Pain, Sore Throat, Visual Disturbances and Yellow Eyes. Cardiovascular Not Present- Chest Pain, Difficulty Breathing Lying Down, Leg Cramps, Palpitations, Rapid Heart Rate, Shortness of Breath and Swelling of Extremities. Gastrointestinal Not Present- Abdominal Pain, Bloating, Bloody Stool, Change in Bowel Habits, Chronic diarrhea, Constipation, Difficulty Swallowing, Excessive gas, Gets full quickly at meals, Hemorrhoids, Indigestion, Nausea, Rectal Pain and Vomiting. Male Genitourinary Not Present- Blood in Urine, Change in Urinary Stream, Frequency, Impotence, Nocturia, Painful Urination, Urgency and Urine Leakage. Musculoskeletal Present-  Swelling of Extremities. Not Present- Back Pain, Joint Pain, Joint Stiffness, Muscle Pain and Muscle Weakness. Neurological Present- Decreased Memory and Trouble walking. Not Present- Fainting, Headaches, Numbness, Seizures, Tingling, Tremor and Weakness. Psychiatric Present- Depression. Not Present- Anxiety, Bipolar, Change in Sleep Pattern,  Fearful and Frequent crying. Endocrine Not Present- Cold Intolerance, Excessive Hunger, Hair Changes, Heat Intolerance, Hot flashes and New Diabetes. Hematology Present- Blood Thinners. Not Present- Easy Bruising, Excessive bleeding, Gland problems, HIV and Persistent Infections.  Vitals [employer] R. Brooks CMA; 07/31/2017 9:10 AM) 07/31/2017 9:10 AM Weight: 144 lb Height: 70in Body Surface Area: 1.82 m Body Mass Index: 20.66 kg/m  BP: 132/76 (Sitting, Left Arm, Standard) BP today 189/67--high  Physical Exam ([name] MD; 07/31/2017 9:37 AM) General Mental Status-Alert. General Appearance-Well groomed. Note: Looks older than stated age Orientation-Oriented X4. Build & Nutrition-Cachectic, Lean and Poorly nourished.  Chest and Lung Exam Chest and lung exam reveals -normal excursion with symmetric chest walls and on auscultation, normal breath sounds, no adventitious sounds and normal vocal resonance.  Cardiovascular Cardiovascular examination reveals -on palpation PMI is normal in location and amplitude, no palpable S3 or S4. Normal cardiac borders. 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  Abdomen Inspection Hernias - Inguinal hernia - Left - Reducible(minimally tender and reducible. No RIH). Examination unchanged.   Assessment & Plan [name] MD; 07/31/2017 9:38 AM) LEFT INGUINAL HERNIA (K40.90) Story: Three week history of left inguinal bulge Impression: Reducible LIH. Warned about sings and symptoms of  incarceration and to go to the ED if this occurs prior to surgery. Will plan or operative management after cardiac clearance Current Plans:  Open [facility] repair with mesh.  To probably admit overnight for observation.  [name]. Dahlia Bailiff, MD, [place] [phone] [facility]

## 2017-08-16 NOTE — Anesthesia Preprocedure Evaluation (Signed)
Anesthesia Evaluation  Patient identified by MRN, date of birth, ID band Patient awake    Reviewed: Allergy & Precautions, NPO status , Patient's Chart, lab work & pertinent test results  Airway Mallampati: II  TM Distance: >3 FB Neck ROM: Full    Dental no notable dental hx. (+) Missing   Pulmonary neg pulmonary ROS,    Pulmonary exam normal breath sounds clear to auscultation       Cardiovascular hypertension, Pt. on medications + Peripheral Vascular Disease  Normal cardiovascular exam Rhythm:Regular Rate:Normal     Neuro/Psych Seizures -, Well Controlled,  CVA, No Residual Symptoms negative psych ROS   GI/Hepatic negative GI ROS, Neg liver ROS,   Endo/Other  diabetes, Type 2, Insulin Dependent  Renal/GU negative Renal ROS  negative genitourinary   Musculoskeletal negative musculoskeletal ROS (+)   Abdominal   Peds negative pediatric ROS (+)  Hematology negative hematology ROS (+)   Anesthesia Other Findings   Reproductive/Obstetrics negative OB ROS                             Anesthesia Physical  Anesthesia Plan  ASA: III  Anesthesia Plan: General   Post-op Pain Management:    Induction: Intravenous  PONV Risk Score and Plan: 2 and Ondansetron and Midazolam  Airway Management Planned: Oral ETT  Additional Equipment:   Intra-op Plan:   Post-operative Plan: Extubation in OR  Informed Consent: I have reviewed the patients History and Physical, chart, labs and discussed the procedure including the risks, benefits and alternatives for the proposed anesthesia with the patient or authorized representative who has indicated his/her understanding and acceptance.   Dental advisory given  Plan Discussed with: CRNA  Anesthesia Plan Comments:         Anesthesia Quick Evaluation

## 2017-08-17 ENCOUNTER — Encounter (HOSPITAL_COMMUNITY): Payer: Self-pay | Admitting: General Surgery

## 2017-08-17 DIAGNOSIS — N189 Chronic kidney disease, unspecified: Secondary | ICD-10-CM | POA: Diagnosis not present

## 2017-08-17 DIAGNOSIS — Z8249 Family history of ischemic heart disease and other diseases of the circulatory system: Secondary | ICD-10-CM | POA: Diagnosis not present

## 2017-08-17 DIAGNOSIS — N400 Enlarged prostate without lower urinary tract symptoms: Secondary | ICD-10-CM | POA: Diagnosis not present

## 2017-08-17 DIAGNOSIS — Z8673 Personal history of transient ischemic attack (TIA), and cerebral infarction without residual deficits: Secondary | ICD-10-CM | POA: Diagnosis not present

## 2017-08-17 DIAGNOSIS — E1151 Type 2 diabetes mellitus with diabetic peripheral angiopathy without gangrene: Secondary | ICD-10-CM | POA: Diagnosis not present

## 2017-08-17 DIAGNOSIS — K4090 Unilateral inguinal hernia, without obstruction or gangrene, not specified as recurrent: Secondary | ICD-10-CM | POA: Diagnosis not present

## 2017-08-17 DIAGNOSIS — Z885 Allergy status to narcotic agent status: Secondary | ICD-10-CM | POA: Diagnosis not present

## 2017-08-17 DIAGNOSIS — Z7902 Long term (current) use of antithrombotics/antiplatelets: Secondary | ICD-10-CM | POA: Diagnosis not present

## 2017-08-17 DIAGNOSIS — Z79899 Other long term (current) drug therapy: Secondary | ICD-10-CM | POA: Diagnosis not present

## 2017-08-17 DIAGNOSIS — M1990 Unspecified osteoarthritis, unspecified site: Secondary | ICD-10-CM | POA: Diagnosis not present

## 2017-08-17 DIAGNOSIS — I509 Heart failure, unspecified: Secondary | ICD-10-CM | POA: Diagnosis not present

## 2017-08-17 DIAGNOSIS — Z880 Allergy status to penicillin: Secondary | ICD-10-CM | POA: Diagnosis not present

## 2017-08-17 DIAGNOSIS — Z794 Long term (current) use of insulin: Secondary | ICD-10-CM | POA: Diagnosis not present

## 2017-08-17 DIAGNOSIS — E1122 Type 2 diabetes mellitus with diabetic chronic kidney disease: Secondary | ICD-10-CM | POA: Diagnosis not present

## 2017-08-17 DIAGNOSIS — I130 Hypertensive heart and chronic kidney disease with heart failure and stage 1 through stage 4 chronic kidney disease, or unspecified chronic kidney disease: Secondary | ICD-10-CM | POA: Diagnosis not present

## 2017-08-17 DIAGNOSIS — E7800 Pure hypercholesterolemia, unspecified: Secondary | ICD-10-CM | POA: Diagnosis not present

## 2017-08-17 LAB — CBC
HCT: 31.7 % — ABNORMAL LOW (ref 39.0–52.0)
Hemoglobin: 9.9 g/dL — ABNORMAL LOW (ref 13.0–17.0)
MCH: 28.3 pg (ref 26.0–34.0)
MCHC: 31.2 g/dL (ref 30.0–36.0)
MCV: 90.6 fL (ref 78.0–100.0)
PLATELETS: 175 10*3/uL (ref 150–400)
RBC: 3.5 MIL/uL — ABNORMAL LOW (ref 4.22–5.81)
RDW: 13 % (ref 11.5–15.5)
WBC: 6.9 10*3/uL (ref 4.0–10.5)

## 2017-08-17 LAB — GLUCOSE, CAPILLARY
GLUCOSE-CAPILLARY: 155 mg/dL — AB (ref 65–99)
Glucose-Capillary: 92 mg/dL (ref 65–99)

## 2017-08-17 MED ORDER — OXYCODONE HCL 5 MG PO TABS: 5.0000 mg | ORAL_TABLET | ORAL | 0 refills | Status: DC | PRN

## 2017-08-17 MED ORDER — CLOPIDOGREL BISULFATE 75 MG PO TABS: 75.0000 mg | ORAL_TABLET | Freq: Every day | ORAL | 0 refills | Status: AC

## 2017-08-17 MED ORDER — TRAMADOL HCL 50 MG PO TABS: 50.0000 mg | ORAL_TABLET | Freq: Four times a day (QID) | ORAL | 0 refills | Status: DC | PRN

## 2017-08-17 NOTE — Discharge Instructions (Signed)
Open Hernia Repair, Adult, Care After These instructions give you information about caring for yourself after your procedure. Your doctor may also give you more specific instructions. If you have problems or questions, contact your doctor. Follow these instructions at home: Surgical cut (incision) care   Follow instructions from your doctor about how to take care of your surgical cut area. Make sure you: ? Wash your hands with soap and water before you change your bandage (dressing). If you cannot use soap and water, use hand sanitizer. ? Change your bandage as told by your doctor--Your dressing can stay in place until you are seen in clinic  ? Leave stitches (sutures), skin glue, or skin tape (adhesive) strips in place. They may need to stay in place for 2 weeks or longer. If tape strips get loose and curl up, you may trim the loose edges. Do not remove tape strips completely unless your doctor says it is okay.  Check your surgical cut every day for signs of infection. Check for: ? More redness, swelling, or pain. ? More fluid or blood. ? Warmth. ? Pus or a bad smell. Activity  Do not drive or use heavy machinery while taking prescription pain medicine. Do not drive until your doctor says it is okay.  Until your doctor says it is okay: ? Do not lift anything that is heavier than 10 lb (4.5 kg). ? Do not play contact sports.  Return to your normal activities as told by your doctor. Ask your doctor what activities are safe. General instructions  To prevent or treat having a hard time pooping (constipation) while you are taking prescription pain medicine, your doctor may recommend that you: ? Drink enough fluid to keep your pee (urine) clear or pale yellow. ? Take over-the-counter or prescription medicines. ? Eat foods that are high in fiber, such as fresh fruits and vegetables, whole grains, and beans. ? Limit foods that are high in fat and processed sugars, such as fried and sweet  foods.  Take over-the-counter and prescription medicines only as told by your doctor.  Do not take baths, swim, or use a hot tub until your doctor says it is okay.  Keep all follow-up visits as told by your doctor. This is important. Contact a doctor if:  You develop a rash.  You have more redness, swelling, or pain around your surgical cut.  You have more fluid or blood coming from your surgical cut.  Your surgical cut feels warm to the touch.  You have pus or a bad smell coming from your surgical cut.  You have a fever or chills.  You have blood in your poop (stool).  You have not pooped in 2-3 days.  Medicine does not help your pain. Get help right away if:  You have chest pain or you are short of breath.  You feel light-headed.  You feel weak and dizzy (feel faint).  You have very bad pain.  You throw up (vomit) and your pain is worse. This information is not intended to replace advice given to you by your health care provider. Make sure you discuss any questions you have with your health care provider.  Kathryne Eriksson. Dahlia Bailiff, MD, [place] [phone] [facility] Surgery

## 2017-08-17 NOTE — Progress Notes (Signed)
Discharge paperwork reviewed with patient. No questions verbalized. Patient is ready to discharge.  

## 2017-08-17 NOTE — Discharge Summary (Signed)
Physician Discharge Summary  Patient ID: [name] MRN: [id number] DOB/AGE: 70-Feb-1947 70 y.o.  Admit date: 08/16/2017 Discharge date: 08/17/2017  Admission Diagnoses:  Discharge Diagnoses:  Active Problems:   Left inguinal hernia   Discharged Condition: good  Hospital Course: Admitted postoperatively after open [facility] repair with mesh.  Primarily admitted because of cardiac history and anticoagulation.  Did well overnight.  No significant left inguinal swelling.  No voiding difficulties.  Wound is clean and dry.  Consults: None  Significant Diagnostic Studies: labs: perioperative CBC and Bmet  Treatments: surgery: LIH repair with mesh  Discharge Exam: Blood pressure (!) 153/68, pulse (!) 59, temperature 98.2 F (36.8 C), temperature source Oral, resp. rate 16, height 5\' 10"  (1.778 m), weight 59.9 kg (132 lb), SpO2 100 %. General appearance: alert, cooperative, appears stated age and no distress GI: soft, non-tender; bowel sounds normal; no masses,  no organomegaly and Incision is clean and dry in left groin  Disposition: Discharge disposition: 01-Home or Self Care       Discharge Instructions    Call MD for:  difficulty breathing, headache or visual disturbances   Complete by:  As directed    Call MD for:  extreme fatigue   Complete by:  As directed    Call MD for:  hives   Complete by:  As directed    Call MD for:  persistant dizziness or light-headedness   Complete by:  As directed    Call MD for:  persistant nausea and vomiting   Complete by:  As directed    Call MD for:  redness, tenderness, or signs of infection (pain, swelling, redness, odor or green/yellow discharge around incision site)   Complete by:  As directed    Call MD for:  severe uncontrolled pain   Complete by:  As directed    Call MD for:  temperature >100.4   Complete by:  As directed    Diet Carb Modified   Complete by:  As directed    Increase activity slowly   Complete by:  As directed     Leave dressing on - Keep it clean, dry, and intact until clinic visit   Complete by:  As directed    Lifting restrictions   Complete by:  As directed    No lifting > 20 pounds for the next 6 weeks.     Allergies as of 08/17/2017      Reactions   Codeine Anaphylaxis   Penicillins Anaphylaxis, Other (See Comments)   PATIENT HAD A PCN REACTION WITH IMMEDIATE RASH, FACIAL/TONGUE/THROAT SWELLING, SOB, OR LIGHTHEADEDNESS WITH HYPOTENSION:  #  #  #  YES  #  #  #  Has patient had a PCN reaction causing severe rash involving mucus membranes or skin necrosis: No Has patient had a PCN reaction that required hospitalization: Unknown Has patient had a PCN reaction occurring within the last 10 years: No If all of the above answers are "NO", then may proceed with Cephalosporin use.      Medication List    TAKE these medications   acetaminophen 500 MG tablet Commonly known as:  TYLENOL Take 500 mg by mouth 2 (two) times daily as needed for moderate pain or headache.   ASPERCREME LIDOCAINE EX Apply 1 application topically daily as needed (for pain).   atorvastatin 20 MG tablet Commonly known as:  LIPITOR Take 20 mg by mouth every evening.   carvedilol 6.25 MG tablet Commonly known as:  COREG Take 1  tablet (6.25 mg total) by mouth 2 (two) times daily with a meal.   CENTRUM SILVER ADULT 50+ PO Take 1 tablet by mouth daily.   clopidogrel 75 MG tablet Commonly known as:  PLAVIX Take 1 tablet (75 mg total) by mouth daily. May restart in  Two days. What changed:  additional instructions   docusate sodium 100 MG capsule Commonly known as:  COLACE Take 1 capsule (100 mg total) by mouth 2 (two) times daily.   feeding supplement (NEPRO CARB STEADY) Liqd Take 237 mLs by mouth 2 (two) times daily between meals.   feeding supplement (PRO-STAT SUGAR FREE 64) Liqd Take 30 mLs by mouth 2 (two) times daily. What changed:  when to take this   furosemide 40 MG tablet Commonly known as:   LASIX Take 40 mg by mouth daily.   gabapentin 100 MG capsule Commonly known as:  NEURONTIN Take 400 mg by mouth at bedtime.   insulin detemir 100 UNIT/ML injection Commonly known as:  LEVEMIR Inject 0.05 mLs (5 Units total) into the skin at bedtime.   insulin lispro 100 UNIT/ML injection Commonly known as:  HUMALOG Inject 0-6 Units into the skin 3 (three) times daily before meals. Sliding scale   isosorbide-hydrALAZINE 20-37.5 MG tablet Commonly known as:  BIDIL Take 1 tablet by mouth 2 (two) times daily.   lamoTRIgine 100 MG tablet Commonly known as:  LAMICTAL TAKE 1 TABLET BY MOUTH TWO  TIMES DAILY   memantine 10 MG tablet Commonly known as:  NAMENDA TAKE 1 TABLET BY MOUTH TWO  TIMES DAILY   nitroGLYCERIN 0.2 mg/hr patch Commonly known as:  NITRODUR - Dosed in mg/24 hr Place 1 patch (0.2 mg total) onto the skin daily. What changed:  when to take this   oxyCODONE 5 MG immediate release tablet Commonly known as:  Oxy IR/ROXICODONE Take 1-2 tablets (5-10 mg total) by mouth every 4 (four) hours as needed for moderate pain.   pentoxifylline 400 MG CR tablet Commonly known as:  TRENTAL Take 1 tablet (400 mg total) by mouth 3 (three) times daily with meals.   polyethylene glycol packet Commonly known as:  MIRALAX / GLYCOLAX Take 17 g by mouth daily as needed for mild constipation.   silver sulfADIAZINE 1 % cream Commonly known as:  SILVADENE Apply 1 application topically daily as needed (sores).   tamsulosin 0.4 MG Caps capsule Commonly known as:  FLOMAX Take 1 capsule (0.4 mg total) by mouth daily after supper.   timolol 0.5 % ophthalmic solution Commonly known as:  BETIMOL Place 1 drop into both eyes 2 (two) times daily.   traMADol 50 MG tablet Commonly known as:  ULTRAM Take 1 tablet (50 mg total) by mouth every 6 (six) hours as needed (mild pain).   Travoprost (BAK Free) 0.004 % Soln ophthalmic solution Commonly known as:  [name] Place 1 drop into both  eyes at bedtime.   venlafaxine XR 75 MG 24 hr capsule Commonly known as:  EFFEXOR-XR Take 75 mg by mouth daily.   vitamin C 500 MG tablet Commonly known as:  ASCORBIC ACID Take 500 mg by mouth daily.      Follow-up Information    Judeth Horn, MD Follow up in 2 week(s).   Specialty:  General Surgery Contact information: [place] [address] [phone]           Signed: Judeth Horn 08/17/2017, 8:01 AM

## 2017-08-21 ENCOUNTER — Other Ambulatory Visit (HOSPITAL_COMMUNITY): Payer: Self-pay | Admitting: *Deleted

## 2017-08-21 DIAGNOSIS — Z794 Long term (current) use of insulin: Secondary | ICD-10-CM | POA: Diagnosis not present

## 2017-08-21 DIAGNOSIS — E1122 Type 2 diabetes mellitus with diabetic chronic kidney disease: Secondary | ICD-10-CM | POA: Diagnosis not present

## 2017-08-22 ENCOUNTER — Ambulatory Visit (HOSPITAL_COMMUNITY)
Admission: RE | Admit: 2017-08-22 | Discharge: 2017-08-22 | Disposition: A | Discharge status: Home / Self Care | Payer: Medicare Other | Source: Ambulatory Visit | Attending: Nephrology | Admitting: Nephrology

## 2017-08-22 VITALS — BP 121/64 | HR 70 | Temp 98.5°F | Resp 20

## 2017-08-22 DIAGNOSIS — Z79899 Other long term (current) drug therapy: Secondary | ICD-10-CM | POA: Insufficient documentation

## 2017-08-22 DIAGNOSIS — N184 Chronic kidney disease, stage 4 (severe): Secondary | ICD-10-CM | POA: Diagnosis not present

## 2017-08-22 DIAGNOSIS — N183 Chronic kidney disease, stage 3 unspecified: Secondary | ICD-10-CM

## 2017-08-22 DIAGNOSIS — Z5181 Encounter for therapeutic drug level monitoring: Secondary | ICD-10-CM | POA: Insufficient documentation

## 2017-08-22 DIAGNOSIS — D631 Anemia in chronic kidney disease: Secondary | ICD-10-CM | POA: Diagnosis not present

## 2017-08-22 LAB — IRON AND TIBC
IRON: 81 ug/dL (ref 45–182)
SATURATION RATIOS: 34 % (ref 17.9–39.5)
TIBC: 239 ug/dL — AB (ref 250–450)
UIBC: 158 ug/dL

## 2017-08-22 LAB — POCT HEMOGLOBIN-HEMACUE: Hemoglobin: 10.6 g/dL — ABNORMAL LOW (ref 13.0–17.0)

## 2017-08-22 LAB — FERRITIN: FERRITIN: 65 ng/mL (ref 24–336)

## 2017-08-22 MED ORDER — DARBEPOETIN ALFA 150 MCG/0.3ML IJ SOSY: 150.0000 ug | PREFILLED_SYRINGE | INTRAMUSCULAR | Status: DC

## 2017-08-22 MED ORDER — DARBEPOETIN ALFA 150 MCG/0.3ML IJ SOSY
PREFILLED_SYRINGE | INTRAMUSCULAR | Status: AC
  Administered 2017-08-22: 150 ug
  Filled 2017-08-22: qty 0.3

## 2017-08-24 DIAGNOSIS — I129 Hypertensive chronic kidney disease with stage 1 through stage 4 chronic kidney disease, or unspecified chronic kidney disease: Secondary | ICD-10-CM | POA: Diagnosis not present

## 2017-08-24 DIAGNOSIS — E1122 Type 2 diabetes mellitus with diabetic chronic kidney disease: Secondary | ICD-10-CM | POA: Diagnosis not present

## 2017-08-24 DIAGNOSIS — N2581 Secondary hyperparathyroidism of renal origin: Secondary | ICD-10-CM | POA: Diagnosis not present

## 2017-08-24 DIAGNOSIS — N184 Chronic kidney disease, stage 4 (severe): Secondary | ICD-10-CM | POA: Diagnosis not present

## 2017-08-24 DIAGNOSIS — D631 Anemia in chronic kidney disease: Secondary | ICD-10-CM | POA: Diagnosis not present

## 2017-08-27 ENCOUNTER — Encounter (HOSPITAL_COMMUNITY): Payer: Self-pay | Admitting: *Deleted

## 2017-08-27 ENCOUNTER — Emergency Department (HOSPITAL_COMMUNITY)
Admission: EM | Admit: 2017-08-27 | Discharge: 2017-08-28 | Disposition: A | Discharge status: Home / Self Care | Payer: Medicare Other | Attending: Emergency Medicine | Admitting: Emergency Medicine

## 2017-08-27 ENCOUNTER — Other Ambulatory Visit: Payer: Self-pay

## 2017-08-27 DIAGNOSIS — N183 Chronic kidney disease, stage 3 (moderate): Secondary | ICD-10-CM | POA: Diagnosis not present

## 2017-08-27 DIAGNOSIS — E119 Type 2 diabetes mellitus without complications: Secondary | ICD-10-CM | POA: Insufficient documentation

## 2017-08-27 DIAGNOSIS — Z7902 Long term (current) use of antithrombotics/antiplatelets: Secondary | ICD-10-CM | POA: Diagnosis not present

## 2017-08-27 DIAGNOSIS — Z794 Long term (current) use of insulin: Secondary | ICD-10-CM | POA: Insufficient documentation

## 2017-08-27 DIAGNOSIS — I5042 Chronic combined systolic (congestive) and diastolic (congestive) heart failure: Secondary | ICD-10-CM | POA: Diagnosis not present

## 2017-08-27 DIAGNOSIS — Z79899 Other long term (current) drug therapy: Secondary | ICD-10-CM | POA: Diagnosis not present

## 2017-08-27 DIAGNOSIS — R404 Transient alteration of awareness: Secondary | ICD-10-CM | POA: Insufficient documentation

## 2017-08-27 DIAGNOSIS — I130 Hypertensive heart and chronic kidney disease with heart failure and stage 1 through stage 4 chronic kidney disease, or unspecified chronic kidney disease: Secondary | ICD-10-CM | POA: Diagnosis not present

## 2017-08-27 DIAGNOSIS — R4182 Altered mental status, unspecified: Secondary | ICD-10-CM | POA: Diagnosis present

## 2017-08-27 LAB — COMPREHENSIVE METABOLIC PANEL
ALBUMIN: 3.6 g/dL (ref 3.5–5.0)
ALK PHOS: 64 U/L (ref 38–126)
ALT: 17 U/L (ref 17–63)
ANION GAP: 11 (ref 5–15)
AST: 29 U/L (ref 15–41)
BUN: 59 mg/dL — ABNORMAL HIGH (ref 6–20)
CALCIUM: 9.5 mg/dL (ref 8.9–10.3)
CO2: 24 mmol/L (ref 22–32)
Chloride: 105 mmol/L (ref 101–111)
Creatinine, Ser: 3.13 mg/dL — ABNORMAL HIGH (ref 0.61–1.24)
GFR calc Af Amer: 22 mL/min — ABNORMAL LOW (ref >60)
GFR calc non Af Amer: 19 mL/min — ABNORMAL LOW (ref >60)
GLUCOSE: 146 mg/dL — AB (ref 65–99)
POTASSIUM: 4.4 mmol/L (ref 3.5–5.1)
SODIUM: 140 mmol/L (ref 135–145)
Total Bilirubin: 0.9 mg/dL (ref 0.3–1.2)
Total Protein: 7 g/dL (ref 6.5–8.1)

## 2017-08-27 LAB — URINALYSIS, ROUTINE W REFLEX MICROSCOPIC
BACTERIA UA: NONE SEEN
Bilirubin Urine: NEGATIVE
Glucose, UA: NEGATIVE mg/dL
Hgb urine dipstick: NEGATIVE
Ketones, ur: NEGATIVE mg/dL
LEUKOCYTES UA: NEGATIVE
Nitrite: NEGATIVE
PROTEIN: 100 mg/dL — AB
Specific Gravity, Urine: 1.009 (ref 1.005–1.030)
pH: 5 (ref 5.0–8.0)

## 2017-08-27 LAB — CBG MONITORING, ED
Glucose-Capillary: 156 mg/dL — ABNORMAL HIGH (ref 65–99)
Glucose-Capillary: 146 mg/dL — ABNORMAL HIGH (ref 65–99)

## 2017-08-27 LAB — CBC
HEMATOCRIT: 35.3 % — AB (ref 39.0–52.0)
HEMOGLOBIN: 11.1 g/dL — AB (ref 13.0–17.0)
MCH: 28.5 pg (ref 26.0–34.0)
MCHC: 31.4 g/dL (ref 30.0–36.0)
MCV: 90.5 fL (ref 78.0–100.0)
Platelets: 278 10*3/uL (ref 150–400)
RBC: 3.9 MIL/uL — ABNORMAL LOW (ref 4.22–5.81)
RDW: 12.9 % (ref 11.5–15.5)
WBC: 7.6 10*3/uL (ref 4.0–10.5)

## 2017-08-27 NOTE — ED Triage Notes (Signed)
Pt reports being seen today for his right foot, states that "it feels hard" denies any pain or injury. Hx of surgery to that foot and takes meds for neuropathy. Per family, pt accidentally took 1600mg  of pentoxifylline ER yesterday. No acute distress noted at triage.
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
## 2017-08-30 DIAGNOSIS — N184 Chronic kidney disease, stage 4 (severe): Secondary | ICD-10-CM | POA: Diagnosis not present

## 2017-08-30 DIAGNOSIS — E1122 Type 2 diabetes mellitus with diabetic chronic kidney disease: Secondary | ICD-10-CM | POA: Diagnosis not present

## 2017-08-30 DIAGNOSIS — I10 Essential (primary) hypertension: Secondary | ICD-10-CM | POA: Diagnosis not present

## 2017-09-03 ENCOUNTER — Ambulatory Visit (INDEPENDENT_AMBULATORY_CARE_PROVIDER_SITE_OTHER): Payer: Medicare Other | Admitting: Orthopedic Surgery

## 2017-09-03 ENCOUNTER — Encounter (INDEPENDENT_AMBULATORY_CARE_PROVIDER_SITE_OTHER): Payer: Self-pay | Admitting: Orthopedic Surgery

## 2017-09-03 VITALS — Ht 70.0 in | Wt 132.0 lb

## 2017-09-03 DIAGNOSIS — L97511 Non-pressure chronic ulcer of other part of right foot limited to breakdown of skin: Secondary | ICD-10-CM
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
## 2017-09-06 DIAGNOSIS — R410 Disorientation, unspecified: Secondary | ICD-10-CM | POA: Diagnosis not present

## 2017-09-06 DIAGNOSIS — N184 Chronic kidney disease, stage 4 (severe): Secondary | ICD-10-CM | POA: Diagnosis not present

## 2017-09-19 ENCOUNTER — Encounter (HOSPITAL_COMMUNITY)
Admission: RE | Admit: 2017-09-19 | Discharge: 2017-09-19 | Disposition: A | Discharge status: Home / Self Care | Payer: Medicare Other | Source: Ambulatory Visit | Attending: Nephrology | Admitting: Nephrology

## 2017-09-19 VITALS — BP 122/65 | HR 72 | Temp 98.4°F | Ht 70.0 in | Wt 145.0 lb

## 2017-09-19 DIAGNOSIS — Z794 Long term (current) use of insulin: Secondary | ICD-10-CM | POA: Diagnosis not present

## 2017-09-19 DIAGNOSIS — N184 Chronic kidney disease, stage 4 (severe): Secondary | ICD-10-CM | POA: Insufficient documentation

## 2017-09-19 DIAGNOSIS — N183 Chronic kidney disease, stage 3 unspecified: Secondary | ICD-10-CM

## 2017-09-19 DIAGNOSIS — D631 Anemia in chronic kidney disease: Secondary | ICD-10-CM | POA: Diagnosis not present

## 2017-09-19 DIAGNOSIS — E1122 Type 2 diabetes mellitus with diabetic chronic kidney disease: Secondary | ICD-10-CM | POA: Diagnosis not present

## 2017-09-19 LAB — POCT HEMOGLOBIN-HEMACUE: Hemoglobin: 11.4 g/dL — ABNORMAL LOW (ref 13.0–17.0)

## 2017-09-19 LAB — IRON AND TIBC
Iron: 66 ug/dL (ref 45–182)
SATURATION RATIOS: 27 % (ref 17.9–39.5)
TIBC: 245 ug/dL — ABNORMAL LOW (ref 250–450)
UIBC: 179 ug/dL

## 2017-09-19 LAB — FERRITIN: Ferritin: 46 ng/mL (ref 24–336)

## 2017-09-19 MED ORDER — DARBEPOETIN ALFA 150 MCG/0.3ML IJ SOSY
150.0000 ug | PREFILLED_SYRINGE | INTRAMUSCULAR | Status: DC
  Administered 2017-09-19: 150 ug via SUBCUTANEOUS

## 2017-09-19 MED ORDER — DARBEPOETIN ALFA 150 MCG/0.3ML IJ SOSY
PREFILLED_SYRINGE | INTRAMUSCULAR | Status: AC
  Administered 2017-09-19: 150 ug via SUBCUTANEOUS
  Filled 2017-09-19: qty 0.3

## 2017-09-24 ENCOUNTER — Encounter (INDEPENDENT_AMBULATORY_CARE_PROVIDER_SITE_OTHER): Payer: Self-pay | Admitting: Orthopedic Surgery

## 2017-09-24 ENCOUNTER — Ambulatory Visit (INDEPENDENT_AMBULATORY_CARE_PROVIDER_SITE_OTHER): Payer: Medicare Other | Admitting: Orthopedic Surgery

## 2017-09-24 VITALS — Ht 70.0 in | Wt 132.0 lb

## 2017-09-24 DIAGNOSIS — L97511 Non-pressure chronic ulcer of other part of right foot limited to breakdown of skin: Secondary | ICD-10-CM
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
## 2017-10-03 ENCOUNTER — Other Ambulatory Visit: Payer: Self-pay | Admitting: Cardiovascular Disease

## 2017-10-03 DIAGNOSIS — I2510 Atherosclerotic heart disease of native coronary artery without angina pectoris: Secondary | ICD-10-CM

## 2017-10-03 DIAGNOSIS — I2583 Coronary atherosclerosis due to lipid rich plaque: Principal | ICD-10-CM

## 2017-10-04 DIAGNOSIS — E1122 Type 2 diabetes mellitus with diabetic chronic kidney disease: Secondary | ICD-10-CM | POA: Diagnosis not present

## 2017-10-04 DIAGNOSIS — I129 Hypertensive chronic kidney disease with stage 1 through stage 4 chronic kidney disease, or unspecified chronic kidney disease: Secondary | ICD-10-CM | POA: Diagnosis not present

## 2017-10-04 DIAGNOSIS — N2581 Secondary hyperparathyroidism of renal origin: Secondary | ICD-10-CM | POA: Diagnosis not present

## 2017-10-04 DIAGNOSIS — N184 Chronic kidney disease, stage 4 (severe): Secondary | ICD-10-CM | POA: Diagnosis not present

## 2017-10-04 DIAGNOSIS — D631 Anemia in chronic kidney disease: Secondary | ICD-10-CM | POA: Diagnosis not present

## 2017-10-05 DIAGNOSIS — E118 Type 2 diabetes mellitus with unspecified complications: Secondary | ICD-10-CM | POA: Diagnosis not present

## 2017-10-11 DIAGNOSIS — N184 Chronic kidney disease, stage 4 (severe): Secondary | ICD-10-CM | POA: Diagnosis not present

## 2017-10-11 DIAGNOSIS — Z8673 Personal history of transient ischemic attack (TIA), and cerebral infarction without residual deficits: Secondary | ICD-10-CM | POA: Diagnosis not present

## 2017-10-11 DIAGNOSIS — Z87898 Personal history of other specified conditions: Secondary | ICD-10-CM | POA: Diagnosis not present

## 2017-10-11 DIAGNOSIS — Z6820 Body mass index (BMI) 20.0-20.9, adult: Secondary | ICD-10-CM | POA: Diagnosis not present

## 2017-10-11 DIAGNOSIS — E1122 Type 2 diabetes mellitus with diabetic chronic kidney disease: Secondary | ICD-10-CM | POA: Diagnosis not present

## 2017-10-17 ENCOUNTER — Encounter (HOSPITAL_COMMUNITY): Payer: Self-pay

## 2017-10-18 ENCOUNTER — Ambulatory Visit (HOSPITAL_COMMUNITY)
Admission: RE | Admit: 2017-10-18 | Discharge: 2017-10-18 | Disposition: A | Discharge status: Home / Self Care | Payer: Medicare Other | Source: Ambulatory Visit | Attending: Nephrology | Admitting: Nephrology

## 2017-10-18 VITALS — BP 123/73 | HR 85 | Temp 98.5°F

## 2017-10-18 DIAGNOSIS — N183 Chronic kidney disease, stage 3 unspecified: Secondary | ICD-10-CM

## 2017-10-18 DIAGNOSIS — N184 Chronic kidney disease, stage 4 (severe): Secondary | ICD-10-CM | POA: Diagnosis not present

## 2017-10-18 DIAGNOSIS — D631 Anemia in chronic kidney disease: Secondary | ICD-10-CM | POA: Diagnosis not present

## 2017-10-18 LAB — FERRITIN: Ferritin: 71 ng/mL (ref 24–336)

## 2017-10-18 LAB — IRON AND TIBC
Iron: 87 ug/dL (ref 45–182)
SATURATION RATIOS: 34 % (ref 17.9–39.5)
TIBC: 258 ug/dL (ref 250–450)
UIBC: 171 ug/dL

## 2017-10-18 LAB — POCT HEMOGLOBIN-HEMACUE: HEMOGLOBIN: 11.9 g/dL — AB (ref 13.0–17.0)

## 2017-10-18 MED ORDER — DARBEPOETIN ALFA 150 MCG/0.3ML IJ SOSY
150.0000 ug | PREFILLED_SYRINGE | INTRAMUSCULAR | Status: DC
  Administered 2017-10-18: 150 ug via SUBCUTANEOUS

## 2017-10-18 MED ORDER — DARBEPOETIN ALFA 150 MCG/0.3ML IJ SOSY
PREFILLED_SYRINGE | INTRAMUSCULAR | Status: AC
  Administered 2017-10-18: 150 ug via SUBCUTANEOUS
  Filled 2017-10-18: qty 0.3

## 2017-10-19 DIAGNOSIS — E1122 Type 2 diabetes mellitus with diabetic chronic kidney disease: Secondary | ICD-10-CM | POA: Diagnosis not present

## 2017-10-19 DIAGNOSIS — Z794 Long term (current) use of insulin: Secondary | ICD-10-CM | POA: Diagnosis not present

## 2017-10-22 ENCOUNTER — Encounter (INDEPENDENT_AMBULATORY_CARE_PROVIDER_SITE_OTHER): Payer: Self-pay | Admitting: Orthopedic Surgery

## 2017-10-22 ENCOUNTER — Ambulatory Visit (INDEPENDENT_AMBULATORY_CARE_PROVIDER_SITE_OTHER): Payer: Medicare Other | Admitting: Orthopedic Surgery

## 2017-10-22 DIAGNOSIS — L97511 Non-pressure chronic ulcer of other part of right foot limited to breakdown of skin: Secondary | ICD-10-CM | POA: Diagnosis not present

## 2017-10-31 ENCOUNTER — Encounter (INDEPENDENT_AMBULATORY_CARE_PROVIDER_SITE_OTHER): Payer: Self-pay | Admitting: Orthopedic Surgery
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
## 2017-11-01 DIAGNOSIS — N184 Chronic kidney disease, stage 4 (severe): Secondary | ICD-10-CM | POA: Diagnosis not present

## 2017-11-01 DIAGNOSIS — N2581 Secondary hyperparathyroidism of renal origin: Secondary | ICD-10-CM | POA: Diagnosis not present

## 2017-11-01 DIAGNOSIS — E1122 Type 2 diabetes mellitus with diabetic chronic kidney disease: Secondary | ICD-10-CM | POA: Diagnosis not present

## 2017-11-01 DIAGNOSIS — D631 Anemia in chronic kidney disease: Secondary | ICD-10-CM | POA: Diagnosis not present

## 2017-11-01 DIAGNOSIS — I129 Hypertensive chronic kidney disease with stage 1 through stage 4 chronic kidney disease, or unspecified chronic kidney disease: Secondary | ICD-10-CM | POA: Diagnosis not present

## 2017-11-14 ENCOUNTER — Encounter (HOSPITAL_COMMUNITY): Payer: Self-pay

## 2017-11-15 ENCOUNTER — Encounter (HOSPITAL_COMMUNITY): Payer: Self-pay

## 2017-11-15 ENCOUNTER — Encounter (HOSPITAL_COMMUNITY)
Admission: RE | Admit: 2017-11-15 | Discharge: 2017-11-15 | Disposition: A | Discharge status: Home / Self Care | Payer: Medicare Other | Source: Ambulatory Visit | Attending: Nephrology | Admitting: Nephrology

## 2017-11-15 VITALS — BP 117/65 | HR 75 | Temp 98.3°F

## 2017-11-15 DIAGNOSIS — N184 Chronic kidney disease, stage 4 (severe): Secondary | ICD-10-CM | POA: Diagnosis not present

## 2017-11-15 DIAGNOSIS — N183 Chronic kidney disease, stage 3 unspecified: Secondary | ICD-10-CM

## 2017-11-15 DIAGNOSIS — D631 Anemia in chronic kidney disease: Secondary | ICD-10-CM | POA: Insufficient documentation

## 2017-11-15 LAB — FERRITIN: Ferritin: 72 ng/mL (ref 24–336)

## 2017-11-15 LAB — IRON AND TIBC
Iron: 88 ug/dL (ref 45–182)
Saturation Ratios: 41 % — ABNORMAL HIGH (ref 17.9–39.5)
TIBC: 217 ug/dL — ABNORMAL LOW (ref 250–450)
UIBC: 129 ug/dL

## 2017-11-15 LAB — POCT HEMOGLOBIN-HEMACUE: Hemoglobin: 12.1 g/dL — ABNORMAL LOW (ref 13.0–17.0)

## 2017-11-15 MED ORDER — DARBEPOETIN ALFA 150 MCG/0.3ML IJ SOSY: 150.0000 ug | PREFILLED_SYRINGE | INTRAMUSCULAR | Status: DC

## 2017-11-21 DIAGNOSIS — E1122 Type 2 diabetes mellitus with diabetic chronic kidney disease: Secondary | ICD-10-CM | POA: Diagnosis not present

## 2017-11-21 DIAGNOSIS — Z794 Long term (current) use of insulin: Secondary | ICD-10-CM | POA: Diagnosis not present

## 2017-11-27 ENCOUNTER — Other Ambulatory Visit (HOSPITAL_COMMUNITY): Payer: Self-pay | Admitting: *Deleted

## 2017-11-27 DIAGNOSIS — R5383 Other fatigue: Secondary | ICD-10-CM | POA: Diagnosis not present

## 2017-11-27 DIAGNOSIS — R3915 Urgency of urination: Secondary | ICD-10-CM | POA: Diagnosis not present

## 2017-11-28 ENCOUNTER — Encounter (HOSPITAL_COMMUNITY)
Admission: RE | Admit: 2017-11-28 | Discharge: 2017-11-28 | Disposition: A | Discharge status: Home / Self Care | Payer: Medicare Other | Source: Ambulatory Visit | Attending: Nephrology | Admitting: Nephrology

## 2017-11-28 VITALS — BP 126/71 | HR 73 | Temp 98.2°F | Resp 20

## 2017-11-28 DIAGNOSIS — N183 Chronic kidney disease, stage 3 unspecified: Secondary | ICD-10-CM

## 2017-11-28 DIAGNOSIS — N184 Chronic kidney disease, stage 4 (severe): Secondary | ICD-10-CM | POA: Diagnosis not present

## 2017-11-28 DIAGNOSIS — D631 Anemia in chronic kidney disease: Secondary | ICD-10-CM | POA: Diagnosis not present

## 2017-11-28 LAB — POCT HEMOGLOBIN-HEMACUE: Hemoglobin: 10.6 g/dL — ABNORMAL LOW (ref 13.0–17.0)

## 2017-11-28 MED ORDER — DARBEPOETIN ALFA 150 MCG/0.3ML IJ SOSY
PREFILLED_SYRINGE | INTRAMUSCULAR | Status: AC
  Filled 2017-11-28: qty 0.3

## 2017-11-28 MED ORDER — DARBEPOETIN ALFA 150 MCG/0.3ML IJ SOSY
150.0000 ug | PREFILLED_SYRINGE | INTRAMUSCULAR | Status: DC
  Administered 2017-11-28: 150 ug via SUBCUTANEOUS

## 2017-11-29 ENCOUNTER — Encounter

## 2017-11-29 ENCOUNTER — Ambulatory Visit: Payer: Medicare Other | Admitting: Neurology

## 2017-12-07 ENCOUNTER — Telehealth (INDEPENDENT_AMBULATORY_CARE_PROVIDER_SITE_OTHER): Payer: Self-pay | Admitting: Orthopedic Surgery

## 2017-12-07 DIAGNOSIS — Z87898 Personal history of other specified conditions: Secondary | ICD-10-CM | POA: Diagnosis not present

## 2017-12-07 DIAGNOSIS — N184 Chronic kidney disease, stage 4 (severe): Secondary | ICD-10-CM | POA: Diagnosis not present

## 2017-12-07 DIAGNOSIS — Z23 Encounter for immunization: Secondary | ICD-10-CM | POA: Diagnosis not present

## 2017-12-07 DIAGNOSIS — Z6820 Body mass index (BMI) 20.0-20.9, adult: Secondary | ICD-10-CM | POA: Diagnosis not present

## 2017-12-07 DIAGNOSIS — E1122 Type 2 diabetes mellitus with diabetic chronic kidney disease: Secondary | ICD-10-CM | POA: Diagnosis not present

## 2017-12-07 DIAGNOSIS — Z8673 Personal history of transient ischemic attack (TIA), and cerebral infarction without residual deficits: Secondary | ICD-10-CM | POA: Diagnosis not present

## 2017-12-07 MED ORDER — PENTOXIFYLLINE ER 400 MG PO TBCR: 400.0000 mg | EXTENDED_RELEASE_TABLET | Freq: Three times a day (TID) | ORAL | 3 refills | Status: DC

## 2017-12-07 NOTE — Telephone Encounter (Signed)
Please advise 

## 2017-12-07 NOTE — Telephone Encounter (Signed)
IC and advised [name], she says they need a refill.  Refill sent in to patient's pharmacy

## 2017-12-07 NOTE — Telephone Encounter (Signed)
Patients wife [name] is calling to wonder if patients medication pentoxifylline should be refilled or if he should stop taking this medication now. Please advise # [phone]

## 2017-12-07 NOTE — Telephone Encounter (Signed)
Would recommend he continue to take the pentoxifylline (trental) until he sees Dr. [name] again in follow up.

## 2017-12-12 ENCOUNTER — Encounter (HOSPITAL_COMMUNITY): Payer: Self-pay

## 2017-12-13 ENCOUNTER — Ambulatory Visit: Payer: Medicare Other | Admitting: Neurology

## 2017-12-13 ENCOUNTER — Other Ambulatory Visit: Payer: Self-pay

## 2017-12-13 ENCOUNTER — Encounter: Payer: Self-pay | Admitting: Neurology

## 2017-12-13 ENCOUNTER — Telehealth: Payer: Self-pay | Admitting: *Deleted

## 2017-12-13 VITALS — BP 141/76 | HR 78 | Resp 20 | Ht 70.0 in | Wt 139.0 lb

## 2017-12-13 DIAGNOSIS — G40909 Epilepsy, unspecified, not intractable, without status epilepticus: Secondary | ICD-10-CM | POA: Diagnosis not present

## 2017-12-13 DIAGNOSIS — R413 Other amnesia: Secondary | ICD-10-CM | POA: Diagnosis not present

## 2017-12-13 NOTE — Telephone Encounter (Signed)
Called and spoke with wife, Shauna Hugh. Offered appt today at 230pm with Dr. Jannifer [place] since he was on wait list. Dr. Jannifer [place] had a cx then. She accepted. I scheduled pt.
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
## 2017-12-17 ENCOUNTER — Encounter (HOSPITAL_COMMUNITY): Payer: Self-pay | Admitting: Emergency Medicine

## 2017-12-17 ENCOUNTER — Other Ambulatory Visit: Payer: Self-pay

## 2017-12-17 ENCOUNTER — Emergency Department (HOSPITAL_COMMUNITY): Payer: Medicare Other

## 2017-12-17 ENCOUNTER — Observation Stay (HOSPITAL_COMMUNITY)
Admission: EM | Admit: 2017-12-17 | Discharge: 2017-12-19 | Disposition: A | Discharge status: Home / Self Care | Payer: Medicare Other | Attending: Internal Medicine | Admitting: Internal Medicine

## 2017-12-17 ENCOUNTER — Emergency Department (HOSPITAL_COMMUNITY)
Admission: EM | Admit: 2017-12-17 | Discharge: 2017-12-17 | Disposition: A | Discharge status: Home / Self Care | Payer: Medicare Other | Source: Home / Self Care | Attending: Emergency Medicine | Admitting: Emergency Medicine

## 2017-12-17 ENCOUNTER — Encounter (HOSPITAL_COMMUNITY): Payer: Self-pay

## 2017-12-17 DIAGNOSIS — R404 Transient alteration of awareness: Secondary | ICD-10-CM | POA: Diagnosis not present

## 2017-12-17 DIAGNOSIS — Z89421 Acquired absence of other right toe(s): Secondary | ICD-10-CM | POA: Insufficient documentation

## 2017-12-17 DIAGNOSIS — N184 Chronic kidney disease, stage 4 (severe): Secondary | ICD-10-CM | POA: Diagnosis not present

## 2017-12-17 DIAGNOSIS — I130 Hypertensive heart and chronic kidney disease with heart failure and stage 1 through stage 4 chronic kidney disease, or unspecified chronic kidney disease: Secondary | ICD-10-CM | POA: Insufficient documentation

## 2017-12-17 DIAGNOSIS — H52203 Unspecified astigmatism, bilateral: Secondary | ICD-10-CM | POA: Diagnosis not present

## 2017-12-17 DIAGNOSIS — K529 Noninfective gastroenteritis and colitis, unspecified: Secondary | ICD-10-CM | POA: Diagnosis present

## 2017-12-17 DIAGNOSIS — Z794 Long term (current) use of insulin: Secondary | ICD-10-CM | POA: Insufficient documentation

## 2017-12-17 DIAGNOSIS — F0390 Unspecified dementia without behavioral disturbance: Secondary | ICD-10-CM | POA: Insufficient documentation

## 2017-12-17 DIAGNOSIS — G40909 Epilepsy, unspecified, not intractable, without status epilepticus: Secondary | ICD-10-CM | POA: Insufficient documentation

## 2017-12-17 DIAGNOSIS — Z833 Family history of diabetes mellitus: Secondary | ICD-10-CM | POA: Diagnosis not present

## 2017-12-17 DIAGNOSIS — E878 Other disorders of electrolyte and fluid balance, not elsewhere classified: Secondary | ICD-10-CM | POA: Diagnosis present

## 2017-12-17 DIAGNOSIS — Z820 Family history of epilepsy and other diseases of the nervous system: Secondary | ICD-10-CM | POA: Diagnosis not present

## 2017-12-17 DIAGNOSIS — R197 Diarrhea, unspecified: Secondary | ICD-10-CM

## 2017-12-17 DIAGNOSIS — R509 Fever, unspecified: Secondary | ICD-10-CM

## 2017-12-17 DIAGNOSIS — N183 Chronic kidney disease, stage 3 (moderate): Secondary | ICD-10-CM | POA: Insufficient documentation

## 2017-12-17 DIAGNOSIS — Z8619 Personal history of other infectious and parasitic diseases: Secondary | ICD-10-CM | POA: Diagnosis not present

## 2017-12-17 DIAGNOSIS — E872 Acidosis, unspecified: Secondary | ICD-10-CM | POA: Diagnosis present

## 2017-12-17 DIAGNOSIS — R55 Syncope and collapse: Secondary | ICD-10-CM

## 2017-12-17 DIAGNOSIS — E119 Type 2 diabetes mellitus without complications: Secondary | ICD-10-CM

## 2017-12-17 DIAGNOSIS — G9340 Encephalopathy, unspecified: Secondary | ICD-10-CM | POA: Insufficient documentation

## 2017-12-17 DIAGNOSIS — E1151 Type 2 diabetes mellitus with diabetic peripheral angiopathy without gangrene: Secondary | ICD-10-CM | POA: Insufficient documentation

## 2017-12-17 DIAGNOSIS — E1122 Type 2 diabetes mellitus with diabetic chronic kidney disease: Secondary | ICD-10-CM | POA: Diagnosis not present

## 2017-12-17 DIAGNOSIS — N400 Enlarged prostate without lower urinary tract symptoms: Secondary | ICD-10-CM | POA: Diagnosis not present

## 2017-12-17 DIAGNOSIS — A0839 Other viral enteritis: Principal | ICD-10-CM | POA: Insufficient documentation

## 2017-12-17 DIAGNOSIS — D631 Anemia in chronic kidney disease: Secondary | ICD-10-CM | POA: Diagnosis not present

## 2017-12-17 DIAGNOSIS — I10 Essential (primary) hypertension: Secondary | ICD-10-CM | POA: Diagnosis present

## 2017-12-17 DIAGNOSIS — Z89512 Acquired absence of left leg below knee: Secondary | ICD-10-CM | POA: Diagnosis not present

## 2017-12-17 DIAGNOSIS — I700 Atherosclerosis of aorta: Secondary | ICD-10-CM | POA: Insufficient documentation

## 2017-12-17 DIAGNOSIS — I5042 Chronic combined systolic (congestive) and diastolic (congestive) heart failure: Secondary | ICD-10-CM | POA: Insufficient documentation

## 2017-12-17 DIAGNOSIS — Z880 Allergy status to penicillin: Secondary | ICD-10-CM | POA: Diagnosis not present

## 2017-12-17 DIAGNOSIS — I959 Hypotension, unspecified: Secondary | ICD-10-CM | POA: Diagnosis not present

## 2017-12-17 DIAGNOSIS — E860 Dehydration: Secondary | ICD-10-CM | POA: Insufficient documentation

## 2017-12-17 DIAGNOSIS — Z86010 Personal history of colonic polyps: Secondary | ICD-10-CM | POA: Diagnosis not present

## 2017-12-17 DIAGNOSIS — I5022 Chronic systolic (congestive) heart failure: Secondary | ICD-10-CM | POA: Diagnosis present

## 2017-12-17 DIAGNOSIS — Z885 Allergy status to narcotic agent status: Secondary | ICD-10-CM | POA: Insufficient documentation

## 2017-12-17 DIAGNOSIS — R0902 Hypoxemia: Secondary | ICD-10-CM | POA: Diagnosis not present

## 2017-12-17 DIAGNOSIS — Z79899 Other long term (current) drug therapy: Secondary | ICD-10-CM | POA: Insufficient documentation

## 2017-12-17 DIAGNOSIS — E876 Hypokalemia: Secondary | ICD-10-CM | POA: Diagnosis not present

## 2017-12-17 DIAGNOSIS — R112 Nausea with vomiting, unspecified: Secondary | ICD-10-CM | POA: Diagnosis present

## 2017-12-17 DIAGNOSIS — Z8673 Personal history of transient ischemic attack (TIA), and cerebral infarction without residual deficits: Secondary | ICD-10-CM | POA: Diagnosis not present

## 2017-12-17 DIAGNOSIS — K567 Ileus, unspecified: Secondary | ICD-10-CM | POA: Insufficient documentation

## 2017-12-17 DIAGNOSIS — K4090 Unilateral inguinal hernia, without obstruction or gangrene, not specified as recurrent: Secondary | ICD-10-CM | POA: Insufficient documentation

## 2017-12-17 DIAGNOSIS — K8020 Calculus of gallbladder without cholecystitis without obstruction: Secondary | ICD-10-CM | POA: Insufficient documentation

## 2017-12-17 DIAGNOSIS — R4020 Unspecified coma: Secondary | ICD-10-CM | POA: Diagnosis not present

## 2017-12-17 DIAGNOSIS — H401133 Primary open-angle glaucoma, bilateral, severe stage: Secondary | ICD-10-CM | POA: Diagnosis not present

## 2017-12-17 DIAGNOSIS — I129 Hypertensive chronic kidney disease with stage 1 through stage 4 chronic kidney disease, or unspecified chronic kidney disease: Secondary | ICD-10-CM | POA: Diagnosis not present

## 2017-12-17 DIAGNOSIS — M1990 Unspecified osteoarthritis, unspecified site: Secondary | ICD-10-CM | POA: Diagnosis not present

## 2017-12-17 DIAGNOSIS — E1165 Type 2 diabetes mellitus with hyperglycemia: Secondary | ICD-10-CM | POA: Diagnosis present

## 2017-12-17 DIAGNOSIS — R1111 Vomiting without nausea: Secondary | ICD-10-CM | POA: Diagnosis not present

## 2017-12-17 DIAGNOSIS — F329 Major depressive disorder, single episode, unspecified: Secondary | ICD-10-CM | POA: Insufficient documentation

## 2017-12-17 DIAGNOSIS — Z743 Need for continuous supervision: Secondary | ICD-10-CM | POA: Diagnosis not present

## 2017-12-17 DIAGNOSIS — N2581 Secondary hyperparathyroidism of renal origin: Secondary | ICD-10-CM | POA: Diagnosis not present

## 2017-12-17 DIAGNOSIS — Z8249 Family history of ischemic heart disease and other diseases of the circulatory system: Secondary | ICD-10-CM | POA: Diagnosis not present

## 2017-12-17 HISTORY — DX: Noninfective gastroenteritis and colitis, unspecified: K52.9

## 2017-12-17 HISTORY — DX: Nausea with vomiting, unspecified: R11.2

## 2017-12-17 LAB — COMPREHENSIVE METABOLIC PANEL
ALBUMIN: 3.8 g/dL (ref 3.5–5.0)
ALK PHOS: 52 U/L (ref 38–126)
ALT: 13 U/L (ref 0–44)
AST: 48 U/L — AB (ref 15–41)
Anion gap: 12 (ref 5–15)
BUN: 55 mg/dL — ABNORMAL HIGH (ref 8–23)
CHLORIDE: 107 mmol/L (ref 98–111)
CO2: 19 mmol/L — AB (ref 22–32)
CREATININE: 3.02 mg/dL — AB (ref 0.61–1.24)
Calcium: 9.4 mg/dL (ref 8.9–10.3)
GFR calc non Af Amer: 19 mL/min — ABNORMAL LOW (ref >60)
GFR, EST AFRICAN AMERICAN: 23 mL/min — AB (ref >60)
GLUCOSE: 122 mg/dL — AB (ref 70–99)
Potassium: 4.4 mmol/L (ref 3.5–5.1)
Sodium: 138 mmol/L (ref 135–145)
Total Bilirubin: 1.3 mg/dL — ABNORMAL HIGH (ref 0.3–1.2)
Total Protein: 6.7 g/dL (ref 6.5–8.1)

## 2017-12-17 LAB — CBC
HCT: 39.7 % (ref 39.0–52.0)
HEMATOCRIT: 34.9 % — AB (ref 39.0–52.0)
HEMOGLOBIN: 10.6 g/dL — AB (ref 13.0–17.0)
Hemoglobin: 12.3 g/dL — ABNORMAL LOW (ref 13.0–17.0)
MCH: 30.1 pg (ref 26.0–34.0)
MCH: 29.9 pg (ref 26.0–34.0)
MCHC: 31 g/dL (ref 30.0–36.0)
MCHC: 30.4 g/dL (ref 30.0–36.0)
MCV: 97.1 fL (ref 78.0–100.0)
MCV: 98.6 fL (ref 78.0–100.0)
PLATELETS: 224 10*3/uL (ref 150–400)
Platelets: 210 10*3/uL (ref 150–400)
RBC: 4.09 MIL/uL — AB (ref 4.22–5.81)
RBC: 3.54 MIL/uL — ABNORMAL LOW (ref 4.22–5.81)
RDW: 15.3 % (ref 11.5–15.5)
RDW: 15.3 % (ref 11.5–15.5)
WBC: 9.4 10*3/uL (ref 4.0–10.5)
WBC: 8.4 10*3/uL (ref 4.0–10.5)

## 2017-12-17 LAB — BASIC METABOLIC PANEL
ANION GAP: 9 (ref 5–15)
BUN: 50 mg/dL — AB (ref 8–23)
CO2: 15 mmol/L — AB (ref 22–32)
Calcium: 8.1 mg/dL — ABNORMAL LOW (ref 8.9–10.3)
Chloride: 115 mmol/L — ABNORMAL HIGH (ref 98–111)
Creatinine, Ser: 2.77 mg/dL — ABNORMAL HIGH (ref 0.61–1.24)
GFR, EST AFRICAN AMERICAN: 25 mL/min — AB (ref >60)
GFR, EST NON AFRICAN AMERICAN: 22 mL/min — AB (ref >60)
GLUCOSE: 102 mg/dL — AB (ref 70–99)
Potassium: 3.3 mmol/L — ABNORMAL LOW (ref 3.5–5.1)
Sodium: 139 mmol/L (ref 135–145)

## 2017-12-17 LAB — URINALYSIS, ROUTINE W REFLEX MICROSCOPIC
Bilirubin Urine: NEGATIVE
Bilirubin Urine: NEGATIVE
Glucose, UA: NEGATIVE mg/dL
Glucose, UA: NEGATIVE mg/dL
HGB URINE DIPSTICK: NEGATIVE
Hgb urine dipstick: NEGATIVE
KETONES UR: NEGATIVE mg/dL
Ketones, ur: NEGATIVE mg/dL
Leukocytes, UA: NEGATIVE
Leukocytes, UA: NEGATIVE
Nitrite: NEGATIVE
Nitrite: NEGATIVE
PH: 5 (ref 5.0–8.0)
PROTEIN: 100 mg/dL — AB
SPECIFIC GRAVITY, URINE: 1.014 (ref 1.005–1.030)
Specific Gravity, Urine: 1.015 (ref 1.005–1.030)
pH: 5 (ref 5.0–8.0)

## 2017-12-17 LAB — LIPASE, BLOOD: LIPASE: 43 U/L (ref 11–51)

## 2017-12-17 LAB — I-STAT CG4 LACTIC ACID, ED: Lactic Acid, Venous: 0.75 mmol/L (ref 0.5–1.9)

## 2017-12-17 LAB — CBG MONITORING, ED: GLUCOSE-CAPILLARY: 99 mg/dL (ref 70–99)

## 2017-12-17 MED ORDER — SODIUM CHLORIDE 0.9 % IV BOLUS
1000.0000 mL | Freq: Once | INTRAVENOUS | Status: AC
  Administered 2017-12-17: 1000 mL via INTRAVENOUS

## 2017-12-17 MED ORDER — SODIUM CHLORIDE 0.9 % IV BOLUS
500.0000 mL | Freq: Once | INTRAVENOUS | Status: AC
  Administered 2017-12-17: 500 mL via INTRAVENOUS

## 2017-12-17 MED ORDER — ONDANSETRON HCL 4 MG PO TABS: 4.0000 mg | ORAL_TABLET | Freq: Three times a day (TID) | ORAL | 0 refills | Status: DC | PRN

## 2017-12-17 NOTE — ED Notes (Signed)
Pt given PO fluids. Pt tolerating 254ml
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
## 2017-12-17 NOTE — ED Notes (Signed)
Pt to xray via stretcher

## 2017-12-17 NOTE — Discharge Instructions (Signed)
Drink plenty of fluids (clear liquids) then start a bland diet later this morning such as toast, crackers, jello, [employer] chicken noodle soup. Use the zofran for nausea or vomiting. Take imodium OTC for diarrhea. Avoid milk products until the diarrhea is gone.

## 2017-12-17 NOTE — ED Triage Notes (Signed)
Pt transported from home by EMS from home, per family pt was d/c today from ED for diarrhea and weakness. While at home just prior to another BM pt became semi unresponsive and diapheretic.  Pt A & O on arrival. VSS WNL.Large loose malodorous BM enroute with EMS.

## 2017-12-18 ENCOUNTER — Emergency Department (HOSPITAL_COMMUNITY): Payer: Medicare Other

## 2017-12-18 ENCOUNTER — Encounter (HOSPITAL_COMMUNITY): Payer: Self-pay | Admitting: General Practice

## 2017-12-18 DIAGNOSIS — K8020 Calculus of gallbladder without cholecystitis without obstruction: Secondary | ICD-10-CM | POA: Diagnosis not present

## 2017-12-18 DIAGNOSIS — K529 Noninfective gastroenteritis and colitis, unspecified: Secondary | ICD-10-CM

## 2017-12-18 DIAGNOSIS — R1111 Vomiting without nausea: Secondary | ICD-10-CM | POA: Diagnosis not present

## 2017-12-18 DIAGNOSIS — I10 Essential (primary) hypertension: Secondary | ICD-10-CM | POA: Diagnosis not present

## 2017-12-18 DIAGNOSIS — E1122 Type 2 diabetes mellitus with diabetic chronic kidney disease: Secondary | ICD-10-CM | POA: Diagnosis not present

## 2017-12-18 DIAGNOSIS — Z794 Long term (current) use of insulin: Secondary | ICD-10-CM

## 2017-12-18 DIAGNOSIS — R112 Nausea with vomiting, unspecified: Secondary | ICD-10-CM | POA: Diagnosis present

## 2017-12-18 DIAGNOSIS — E860 Dehydration: Secondary | ICD-10-CM | POA: Diagnosis not present

## 2017-12-18 DIAGNOSIS — N184 Chronic kidney disease, stage 4 (severe): Secondary | ICD-10-CM | POA: Diagnosis not present

## 2017-12-18 DIAGNOSIS — R197 Diarrhea, unspecified: Secondary | ICD-10-CM

## 2017-12-18 LAB — BASIC METABOLIC PANEL
ANION GAP: 5 (ref 5–15)
ANION GAP: 8 (ref 5–15)
BUN: 47 mg/dL — ABNORMAL HIGH (ref 8–23)
BUN: 42 mg/dL — ABNORMAL HIGH (ref 8–23)
CALCIUM: 7.5 mg/dL — AB (ref 8.9–10.3)
CALCIUM: 7.5 mg/dL — AB (ref 8.9–10.3)
CO2: 14 mmol/L — ABNORMAL LOW (ref 22–32)
CO2: 13 mmol/L — AB (ref 22–32)
CREATININE: 2.53 mg/dL — AB (ref 0.61–1.24)
Chloride: 116 mmol/L — ABNORMAL HIGH (ref 98–111)
Chloride: 114 mmol/L — ABNORMAL HIGH (ref 98–111)
Creatinine, Ser: 2.66 mg/dL — ABNORMAL HIGH (ref 0.61–1.24)
GFR calc Af Amer: 26 mL/min — ABNORMAL LOW (ref >60)
GFR, EST AFRICAN AMERICAN: 28 mL/min — AB (ref >60)
GFR, EST NON AFRICAN AMERICAN: 23 mL/min — AB (ref >60)
GFR, EST NON AFRICAN AMERICAN: 24 mL/min — AB (ref >60)
GLUCOSE: 108 mg/dL — AB (ref 70–99)
Glucose, Bld: 173 mg/dL — ABNORMAL HIGH (ref 70–99)
Potassium: 3.5 mmol/L (ref 3.5–5.1)
Potassium: 4.3 mmol/L (ref 3.5–5.1)
SODIUM: 135 mmol/L (ref 135–145)
Sodium: 135 mmol/L (ref 135–145)

## 2017-12-18 LAB — HIV ANTIBODY (ROUTINE TESTING W REFLEX): HIV SCREEN 4TH GENERATION: NONREACTIVE

## 2017-12-18 LAB — GASTROINTESTINAL PANEL BY PCR, STOOL (REPLACES STOOL CULTURE)
ASTROVIRUS: NOT DETECTED
Adenovirus F40/41: NOT DETECTED
CAMPYLOBACTER SPECIES: NOT DETECTED
CYCLOSPORA CAYETANENSIS: NOT DETECTED
Cryptosporidium: NOT DETECTED
ENTEROTOXIGENIC E COLI (ETEC): NOT DETECTED
Entamoeba histolytica: NOT DETECTED
Enteroaggregative E coli (EAEC): NOT DETECTED
Enteropathogenic E coli (EPEC): NOT DETECTED
Giardia lamblia: NOT DETECTED
Norovirus GI/GII: NOT DETECTED
PLESIMONAS SHIGELLOIDES: NOT DETECTED
ROTAVIRUS A: NOT DETECTED
SAPOVIRUS (I, II, IV, AND V): NOT DETECTED
Salmonella species: NOT DETECTED
Shiga like toxin producing E coli (STEC): NOT DETECTED
Shigella/Enteroinvasive E coli (EIEC): NOT DETECTED
VIBRIO SPECIES: NOT DETECTED
Vibrio cholerae: NOT DETECTED
Yersinia enterocolitica: NOT DETECTED

## 2017-12-18 LAB — GLUCOSE, CAPILLARY
GLUCOSE-CAPILLARY: 94 mg/dL (ref 70–99)
GLUCOSE-CAPILLARY: 92 mg/dL (ref 70–99)
Glucose-Capillary: 97 mg/dL (ref 70–99)
Glucose-Capillary: 181 mg/dL — ABNORMAL HIGH (ref 70–99)
Glucose-Capillary: 173 mg/dL — ABNORMAL HIGH (ref 70–99)

## 2017-12-18 LAB — I-STAT CG4 LACTIC ACID, ED: LACTIC ACID, VENOUS: 0.76 mmol/L (ref 0.5–1.9)

## 2017-12-18 MED ORDER — SODIUM BICARBONATE 650 MG PO TABS
650.0000 mg | ORAL_TABLET | Freq: Three times a day (TID) | ORAL | Status: DC
  Administered 2017-12-18 – 2017-12-19 (×4): 650 mg via ORAL
  Filled 2017-12-18 (×4): qty 1

## 2017-12-18 MED ORDER — ATORVASTATIN CALCIUM 20 MG PO TABS
20.0000 mg | ORAL_TABLET | Freq: Every evening | ORAL | Status: DC
  Administered 2017-12-18: 20 mg via ORAL
  Filled 2017-12-18: qty 1

## 2017-12-18 MED ORDER — LAMOTRIGINE 100 MG PO TABS
100.0000 mg | ORAL_TABLET | Freq: Two times a day (BID) | ORAL | Status: DC
  Administered 2017-12-18 – 2017-12-19 (×3): 100 mg via ORAL
  Filled 2017-12-18 (×3): qty 1

## 2017-12-18 MED ORDER — ONDANSETRON HCL 4 MG PO TABS: 4.0000 mg | ORAL_TABLET | Freq: Four times a day (QID) | ORAL | Status: DC | PRN

## 2017-12-18 MED ORDER — SODIUM CHLORIDE 0.9 % IV BOLUS
1000.0000 mL | Freq: Once | INTRAVENOUS | Status: AC
  Administered 2017-12-18: 1000 mL via INTRAVENOUS

## 2017-12-18 MED ORDER — INSULIN ASPART 100 UNIT/ML ~~LOC~~ SOLN: 0.0000 [IU] | Freq: Every day | SUBCUTANEOUS | Status: DC

## 2017-12-18 MED ORDER — TAMSULOSIN HCL 0.4 MG PO CAPS
0.4000 mg | ORAL_CAPSULE | Freq: Every day | ORAL | Status: DC
  Administered 2017-12-18: 0.4 mg via ORAL
  Filled 2017-12-18: qty 1

## 2017-12-18 MED ORDER — GABAPENTIN 300 MG PO CAPS
300.0000 mg | ORAL_CAPSULE | Freq: Every day | ORAL | Status: DC
  Administered 2017-12-18: 300 mg via ORAL
  Filled 2017-12-18: qty 1

## 2017-12-18 MED ORDER — ENOXAPARIN SODIUM 40 MG/0.4ML ~~LOC~~ SOLN
40.0000 mg | SUBCUTANEOUS | Status: DC
  Administered 2017-12-18: 40 mg via SUBCUTANEOUS
  Filled 2017-12-18: qty 0.4

## 2017-12-18 MED ORDER — CLOPIDOGREL BISULFATE 75 MG PO TABS
75.0000 mg | ORAL_TABLET | Freq: Every day | ORAL | Status: DC
  Administered 2017-12-18 – 2017-12-19 (×2): 75 mg via ORAL
  Filled 2017-12-18 (×2): qty 1

## 2017-12-18 MED ORDER — ENOXAPARIN SODIUM 30 MG/0.3ML ~~LOC~~ SOLN
30.0000 mg | SUBCUTANEOUS | Status: DC
  Administered 2017-12-19: 30 mg via SUBCUTANEOUS
  Filled 2017-12-18: qty 0.3

## 2017-12-18 MED ORDER — MEMANTINE HCL 10 MG PO TABS
10.0000 mg | ORAL_TABLET | Freq: Two times a day (BID) | ORAL | Status: DC
  Administered 2017-12-18 – 2017-12-19 (×3): 10 mg via ORAL
  Filled 2017-12-18 (×3): qty 1

## 2017-12-18 MED ORDER — VENLAFAXINE HCL ER 75 MG PO CP24
75.0000 mg | ORAL_CAPSULE | Freq: Every day | ORAL | Status: DC
  Administered 2017-12-18 – 2017-12-19 (×2): 75 mg via ORAL
  Filled 2017-12-18 (×2): qty 1

## 2017-12-18 MED ORDER — ACETAMINOPHEN 500 MG PO TABS: 500.0000 mg | ORAL_TABLET | Freq: Two times a day (BID) | ORAL | Status: DC | PRN

## 2017-12-18 MED ORDER — MEGESTROL ACETATE 40 MG PO TABS
40.0000 mg | ORAL_TABLET | Freq: Two times a day (BID) | ORAL | Status: DC
  Administered 2017-12-18 – 2017-12-19 (×3): 40 mg via ORAL
  Filled 2017-12-18 (×3): qty 1

## 2017-12-18 MED ORDER — ONDANSETRON HCL 4 MG/2ML IJ SOLN: 4.0000 mg | Freq: Four times a day (QID) | INTRAMUSCULAR | Status: DC | PRN

## 2017-12-18 MED ORDER — LATANOPROST 0.005 % OP SOLN
1.0000 [drp] | Freq: Every day | OPHTHALMIC | Status: DC
  Administered 2017-12-18: 1 [drp] via OPHTHALMIC
  Filled 2017-12-18: qty 2.5

## 2017-12-18 MED ORDER — TIMOLOL MALEATE 0.5 % OP SOLN
1.0000 [drp] | Freq: Two times a day (BID) | OPHTHALMIC | Status: DC
  Administered 2017-12-18 – 2017-12-19 (×3): 1 [drp] via OPHTHALMIC
  Filled 2017-12-18: qty 5

## 2017-12-18 MED ORDER — CARVEDILOL 12.5 MG PO TABS
6.2500 mg | ORAL_TABLET | Freq: Two times a day (BID) | ORAL | Status: DC
  Administered 2017-12-18 – 2017-12-19 (×3): 6.25 mg via ORAL
  Filled 2017-12-18 (×3): qty 1

## 2017-12-18 MED ORDER — INSULIN ASPART 100 UNIT/ML ~~LOC~~ SOLN
0.0000 [IU] | Freq: Three times a day (TID) | SUBCUTANEOUS | Status: DC
  Administered 2017-12-18: 3 [IU] via SUBCUTANEOUS
  Administered 2017-12-19: 2 [IU] via SUBCUTANEOUS

## 2017-12-18 MED ORDER — SODIUM CHLORIDE 0.9 % IV SOLN
INTRAVENOUS | Status: DC
  Administered 2017-12-18: 06:00:00 via INTRAVENOUS

## 2017-12-18 MED ORDER — INSULIN ASPART 100 UNIT/ML ~~LOC~~ SOLN: 0.0000 [IU] | SUBCUTANEOUS | Status: DC

## 2017-12-18 MED ORDER — POTASSIUM CHLORIDE CRYS ER 20 MEQ PO TBCR
40.0000 meq | EXTENDED_RELEASE_TABLET | Freq: Once | ORAL | Status: AC
  Administered 2017-12-18: 40 meq via ORAL
  Filled 2017-12-18: qty 2

## 2017-12-18 MED ORDER — PENTOXIFYLLINE ER 400 MG PO TBCR
400.0000 mg | EXTENDED_RELEASE_TABLET | Freq: Three times a day (TID) | ORAL | Status: DC
  Administered 2017-12-18 – 2017-12-19 (×4): 400 mg via ORAL
  Filled 2017-12-18 (×6): qty 1

## 2017-12-18 MED ORDER — VITAMIN C 500 MG PO TABS
500.0000 mg | ORAL_TABLET | Freq: Every day | ORAL | Status: DC
  Administered 2017-12-18 – 2017-12-19 (×2): 500 mg via ORAL
  Filled 2017-12-18 (×2): qty 1

## 2017-12-18 MED ORDER — SODIUM BICARBONATE 8.4 % IV SOLN
INTRAVENOUS | Status: DC
  Administered 2017-12-18: 20:00:00 via INTRAVENOUS
  Filled 2017-12-18: qty 100
  Filled 2017-12-18: qty 50

## 2017-12-18 NOTE — Progress Notes (Signed)
PROGRESS NOTE                                                                                                                                                                                                             Patient Demographics:    [name], is a 72 y.o. male, DOB - 1945-05-06, QQP:[id number]  Admit date - 12/17/2017   Admitting Physician Etta Quill, DO  Outpatient Primary MD for the patient is Deland Pretty, MD  LOS - 0  Outpatient Specialists: None  Chief Complaint  Patient presents with  . Near Syncope       Brief Narrative 72 year old male with type 2 diabetes mellitus, CKD stage IV, hypertension, left BKA and dementia (?  Mild) presented with 3 days history of abdominal discomfort with vomiting and watery diarrhea.  Also reportedly had some confusion at home.  Blood work in the ED showed low potassium with baseline CKD and low bicarb of 14.  CT of the abdomen was done suggestive of enteritis.  Placed on observation for IV hydration and monitoring.      Subjective:   Denies any abdominal discomfort, nausea or vomiting this morning.  Had a loose bowel movement earlier this morning.  Tolerating clears.   Assessment  & Plan :    Principal Problem: Acute viral enteritis. As seen on CT with mild ileus.  Abdominal exam is benign this morning.  Has not had diarrhea for past few hours and nausea and vomiting resolved.  Tolerating clears.  Continue IV hydration.  GI pathogen panel was negative.  No need for antibiotics at present.  Advance diet to full liquid.  Active Problems:   DM2 (diabetes mellitus, type 2) (HCC) Monitor on sliding scale coverage.  Essential hypertension Blood pressure stable.  Continue Coreg.  BiDil on hold.    CKD (chronic kidney disease) stage 4, GFR 15-29 ml/min (HCC) Renal function at baseline.  Chronic metabolic acidosis Has chronically low bicarb.  Patient on oral  bicarb at home which is continued at 650 mg 3 times daily.  CO2 is 14 this morning which is lower from his baseline likely due to GI loss.  Recheck labs this afternoon.   Hypokalemia Replenished  History of seizure disorder Continue Lamictal.       Code Status : Full code  Family Communication  :  None at bedside  Disposition Plan  : Home tomorrow if tolerating advance diet and hydrated adequately  Barriers For Discharge : Active symptoms  Consults  : None  Procedures  : CT abdomen  DVT Prophylaxis  :  Lovenox -   Lab Results  Component Value Date   PLT 210 12/17/2017    Antibiotics  :    Anti-infectives (From admission, onward)   None        Objective:   Vitals:   12/18/17 0200 12/18/17 0230 12/18/17 0315 12/18/17 0405  BP: (!) 145/67 (!) 147/65 (!) 143/62 128/63  Pulse: 77 79 76 79  Resp: 15 14 14 16   Temp:    98.8 F (37.1 C)  TempSrc:    Oral  SpO2: 100% 97% 99% 100%  Weight:    61.8 kg  Height:        Wt Readings from Last 3 Encounters:  12/18/17 61.8 kg  12/17/17 63 kg  12/13/17 63 kg     Intake/Output Summary (Last 24 hours) at 12/18/2017 1118 Last data filed at 12/18/2017 0600 Gross per 24 hour  Intake 75 ml  Output 250 ml  Net -175 ml     Physical Exam  Gen: not in distress HEENT: Pallor present moist mucosa, supple neck Chest: clear b/l, no added sounds CVS: N S1&S2, no murmurs, rubs or gallop GI: soft, NT, ND, BS+ Musculoskeletal: warm, no edema     Data Review:    CBC Recent Labs  Lab 12/17/17 0449 12/17/17 2304  WBC 9.4 8.4  HGB 12.3* 10.6*  HCT 39.7 34.9*  PLT 224 210  MCV 97.1 98.6  MCH 30.1 29.9  MCHC 31.0 30.4  RDW 15.3 15.3    Chemistries  Recent Labs  Lab 12/17/17 0449 12/17/17 2304 12/18/17 0432  NA 138 139 135  K 4.4 3.3* 3.5  CL 107 115* 116*  CO2 19* 15* 14*  GLUCOSE 122* 102* 108*  BUN 55* 50* 47*  CREATININE 3.02* 2.77* 2.66*  CALCIUM 9.4 8.1* 7.5*  AST 48*  --   --   ALT 13  --    --   ALKPHOS 52  --   --   BILITOT 1.3*  --   --    ------------------------------------------------------------------------------------------------------------------ No results for input(s): CHOL, HDL, LDLCALC, TRIG, CHOLHDL, LDLDIRECT in the last 72 hours.  Lab Results  Component Value Date   HGBA1C 5.6 08/10/2017   ------------------------------------------------------------------------------------------------------------------ No results for input(s): TSH, T4TOTAL, T3FREE, THYROIDAB in the last 72 hours.  Invalid input(s): FREET3 ------------------------------------------------------------------------------------------------------------------ No results for input(s): VITAMINB12, FOLATE, FERRITIN, TIBC, IRON, RETICCTPCT in the last 72 hours.  Coagulation profile No results for input(s): INR, PROTIME in the last 168 hours.  No results for input(s): DDIMER in the last 72 hours.  Cardiac Enzymes No results for input(s): CKMB, TROPONINI, MYOGLOBIN in the last 168 hours.  Invalid input(s): CK ------------------------------------------------------------------------------------------------------------------    Component Value Date/Time   BNP 12.7 05/06/2017 2042    Inpatient Medications  Scheduled Meds: . atorvastatin  20 mg Oral QPM  . carvedilol  6.25 mg Oral BID WC  . clopidogrel  75 mg Oral Daily  . enoxaparin (LOVENOX) injection  40 mg Subcutaneous Q24H  . gabapentin  300 mg Oral QHS  . insulin aspart  0-15 Units Subcutaneous TID WC  . insulin aspart  0-5 Units Subcutaneous QHS  . lamoTRIgine  100 mg Oral BID  . latanoprost  1 drop Both Eyes QHS  . megestrol  40  mg Oral BID  . memantine  10 mg Oral BID  . pentoxifylline  400 mg Oral TID WC  . sodium bicarbonate  650 mg Oral TID  . tamsulosin  0.4 mg Oral QPC supper  . timolol  1 drop Both Eyes BID  . venlafaxine XR  75 mg Oral Daily  . vitamin C  500 mg Oral Daily   Continuous Infusions: . sodium chloride 75  mL/hr at 12/18/17 0535   PRN Meds:.acetaminophen, ondansetron **OR** ondansetron (ZOFRAN) IV  Micro Results Recent Results (from the past 240 hour(s))  Blood Culture (routine x 2)     Status: None (Preliminary result)   Collection Time: 12/18/17 12:45 AM  Result Value Ref Range Status   Specimen Description BLOOD RIGHT ANTECUBITAL  Final   Special Requests   Final    BOTTLES DRAWN AEROBIC AND ANAEROBIC Blood Culture results may not be optimal due to an excessive volume of blood received in culture bottles   Culture   Final    NO GROWTH < 12 HOURS Performed at [facility] [address], [place], [place] [address]    Report Status PENDING  Incomplete  Blood Culture (routine x 2)     Status: None (Preliminary result)   Collection Time: 12/18/17 12:55 AM  Result Value Ref Range Status   Specimen Description BLOOD LEFT WRIST  Final   Special Requests   Final    BOTTLES DRAWN AEROBIC ONLY Blood Culture results may not be optimal due to an excessive volume of blood received in culture bottles   Culture   Final    NO GROWTH < 12 HOURS Performed at [facility], [place], [place], [place] [address]    Report Status PENDING  Incomplete  Gastrointestinal Panel by PCR , Stool     Status: None   Collection Time: 12/18/17  4:43 AM  Result Value Ref Range Status   Campylobacter species NOT DETECTED NOT DETECTED Final   Plesimonas shigelloides NOT DETECTED NOT DETECTED Final   Salmonella species NOT DETECTED NOT DETECTED Final   Yersinia enterocolitica NOT DETECTED NOT DETECTED Final   Vibrio species NOT DETECTED NOT DETECTED Final   Vibrio cholerae NOT DETECTED NOT DETECTED Final   Enteroaggregative E coli (EAEC) NOT DETECTED NOT DETECTED Final   Enteropathogenic E coli (EPEC) NOT DETECTED NOT DETECTED Final   Enterotoxigenic E coli (ETEC) NOT DETECTED NOT DETECTED Final   Shiga like toxin producing E coli (STEC) NOT DETECTED NOT DETECTED Final   Shigella/Enteroinvasive  E coli (EIEC) NOT DETECTED NOT DETECTED Final   Cryptosporidium NOT DETECTED NOT DETECTED Final   Cyclospora cayetanensis NOT DETECTED NOT DETECTED Final   Entamoeba histolytica NOT DETECTED NOT DETECTED Final   Giardia lamblia NOT DETECTED NOT DETECTED Final   Adenovirus F40/41 NOT DETECTED NOT DETECTED Final   Astrovirus NOT DETECTED NOT DETECTED Final   Norovirus GI/GII NOT DETECTED NOT DETECTED Final   Rotavirus A NOT DETECTED NOT DETECTED Final   Sapovirus (I, II, IV, and V) NOT DETECTED NOT DETECTED Final    Comment: Performed at [facility], [address], [place], [place] [address]    Radiology Reports Ct Abdomen Pelvis Wo Contrast  Result Date: 12/18/2017 CLINICAL DATA:  Nausea, vomiting and diarrhea. Weakness. History of inguinal hernia repair, partial colectomy, diabetes. EXAM: CT ABDOMEN AND PELVIS WITHOUT CONTRAST TECHNIQUE: Multidetector CT imaging of the abdomen and pelvis was performed following the standard protocol without IV contrast. COMPARISON:  CT abdomen and pelvis June 13, 2017 FINDINGS: LOWER CHEST: Dependent atelectasis. The visualized heart size is normal. Mild coronary artery calcification. No pericardial effusion. HEPATOBILIARY: Negative liver. Punctate dependent cholelithiasis at the gallbladder neck. PANCREAS: Normal. SPLEEN: Normal. ADRENALS/URINARY TRACT: Kidneys are orthotopic, demonstrating normal size and morphology. No nephrolithiasis, hydronephrosis; limited assessment for renal masses by nonenhanced CT. The unopacified ureters are normal in course and caliber. Urinary bladder is partially distended and unremarkable. Normal adrenal glands. STOMACH/BOWEL: Small and large bowel air-fluid levels, top-normal small bowel at 2.8 cm. Enteric contrast has not yet reached the distal small bowel. Status post RIGHT hemicolectomy. Debris and contrast distended stomach. VASCULAR/LYMPHATIC: Aortoiliac vessels are normal in course and caliber. Mild calcific  atherosclerosis. No lymphadenopathy by CT size criteria. REPRODUCTIVE: Prostatomegaly invading base of bladder. OTHER: No intraperitoneal free fluid or free air. MUSCULOSKELETAL: Non-acute. Streak artifact from LEFT hip femoral neck pinning. Severe degenerative change of the RIGHT hip with old acetabular fracture. Old RIGHT superior and inferior pubic rami fractures. Advanced lower lumbar facet arthropathy. IMPRESSION: 1. Small and large bowel air-fluid levels compatible with enteritis. Mild ileus. 2. Cholelithiasis, no CT findings of acute cholecystitis. Aortic Atherosclerosis (ICD10-I70.0). Electronically Signed   By: Elon Alas M.D.   On: 12/18/2017 01:47   Dg Chest 2 View  Result Date: 12/17/2017 CLINICAL DATA:  Fever EXAM: CHEST - 2 VIEW COMPARISON:  08/10/2017 FINDINGS: Lungs are clear.  No pleural effusion or pneumothorax. The heart is normal in size. Visualized osseous structures are within normal limits. IMPRESSION: Normal chest radiographs. Electronically Signed   By: Julian Hy M.D.   On: 12/17/2017 22:57    Time Spent in minutes  25   Jeanise Durfey M.D on 12/18/2017 at 11:18 AM  Between 7am to 7pm - Pager - [phone]  After 7pm go to [email or web address] - password [facility]  [facility] -  Office  [phone]

## 2017-12-18 NOTE — ED Provider Notes (Signed)
Patient signed out pending CT scan.  Per primary physician evaluation, patient with episode of altered mental status and possible syncope.  He has had 3-day history of vomiting and diarrhea.  Was seen and evaluated last night for the same.  Was able to tolerate fluids after receiving IV fluids.  Had persistent symptoms at home.  Had an episode of altered awareness.  Here his vital signs have been stable.  Clinically appear dry.  Patient was given fluids.  He does not appear septic.  He had some abdominal bloating.  CT scan was obtained to rule out obstruction.  CT scan does not show any evidence of this obstruction.  Shows evidence of enteritis.  On recheck, he remains hemodynamically stable.  Feels somewhat better after fluids.  Patient and family would feel more comfortable with observation admission for IV hydration given recurrence of symptoms and second ED visit.  Feel this is reasonable.   Merryl Hacker, MD 12/18/17 [phone]
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
## 2017-12-18 NOTE — ED Notes (Signed)
Confirmation by Dr. Dina Rich to cancel code sepsis, elink notified

## 2017-12-18 NOTE — ED Notes (Signed)
Pt returned from CT, VSS, family at bedside.

## 2017-12-18 NOTE — Plan of Care (Signed)
  Problem: Clinical Measurements: Goal: Ability to maintain clinical measurements within normal limits will improve Outcome: Progressing Goal: Will remain free from infection Outcome: Progressing Goal: Diagnostic test results will improve Outcome: Progressing   Problem: Activity: Goal: Risk for activity intolerance will decrease Outcome: Progressing   Problem: Nutrition: Goal: Adequate nutrition will be maintained Outcome: Progressing   Problem: Skin Integrity: Goal: Risk for impaired skin integrity will decrease Outcome: Progressing

## 2017-12-19 DIAGNOSIS — R112 Nausea with vomiting, unspecified: Secondary | ICD-10-CM

## 2017-12-19 DIAGNOSIS — I5022 Chronic systolic (congestive) heart failure: Secondary | ICD-10-CM | POA: Diagnosis present

## 2017-12-19 DIAGNOSIS — N184 Chronic kidney disease, stage 4 (severe): Secondary | ICD-10-CM | POA: Diagnosis not present

## 2017-12-19 DIAGNOSIS — E872 Acidosis, unspecified: Secondary | ICD-10-CM | POA: Diagnosis present

## 2017-12-19 DIAGNOSIS — E878 Other disorders of electrolyte and fluid balance, not elsewhere classified: Secondary | ICD-10-CM

## 2017-12-19 DIAGNOSIS — E860 Dehydration: Secondary | ICD-10-CM | POA: Diagnosis present

## 2017-12-19 DIAGNOSIS — R197 Diarrhea, unspecified: Secondary | ICD-10-CM

## 2017-12-19 DIAGNOSIS — K529 Noninfective gastroenteritis and colitis, unspecified: Secondary | ICD-10-CM | POA: Diagnosis not present

## 2017-12-19 LAB — BASIC METABOLIC PANEL
Anion gap: 8 (ref 5–15)
BUN: 31 mg/dL — AB (ref 8–23)
CALCIUM: 7.8 mg/dL — AB (ref 8.9–10.3)
CO2: 17 mmol/L — AB (ref 22–32)
CREATININE: 2.01 mg/dL — AB (ref 0.61–1.24)
Chloride: 112 mmol/L — ABNORMAL HIGH (ref 98–111)
GFR calc non Af Amer: 32 mL/min — ABNORMAL LOW (ref >60)
GFR, EST AFRICAN AMERICAN: 37 mL/min — AB (ref >60)
Glucose, Bld: 160 mg/dL — ABNORMAL HIGH (ref 70–99)
Potassium: 4 mmol/L (ref 3.5–5.1)
SODIUM: 137 mmol/L (ref 135–145)

## 2017-12-19 LAB — GLUCOSE, CAPILLARY: Glucose-Capillary: 149 mg/dL — ABNORMAL HIGH (ref 70–99)

## 2017-12-19 MED ORDER — HYDRALAZINE HCL 20 MG/ML IJ SOLN
5.0000 mg | INTRAMUSCULAR | Status: DC | PRN
  Administered 2017-12-19: 5 mg via INTRAVENOUS
  Filled 2017-12-19: qty 1

## 2017-12-19 NOTE — Progress Notes (Signed)
Discharge home. Home discharge instruction given, no quuestion verbalized.

## 2017-12-19 NOTE — Discharge Summary (Addendum)
Physician Discharge Summary  [name]. RWE:[id number] DOB: Mar 05, 1946 DOA: 12/17/2017  PCP: Deland Pretty, MD  Admit date: 12/17/2017 Discharge date: 12/19/2017  Admitted From: Home Disposition: Home  Recommendations for Outpatient Follow-up:  1. Follow up with PCP in 1 week.  Home Health: None Equipment/Devices: Has walker at home  Discharge Condition: Fair CODE STATUS: Full code Diet recommendation: Heart Healthy / Carb Modified   Discharge Diagnoses:  Principal Problem:   Enteritis presumed infectious   Active Problems:   DM2 (diabetes mellitus, type 2) ([place])   Essential hypertension   CKD (chronic kidney disease) stage 4, GFR 15-29 ml/min (HCC)   Uncontrolled type 2 diabetes mellitus with hyperglycemia (HCC)   Nausea vomiting and diarrhea   Chronic systolic CHF (congestive heart failure) (HCC)   Low bicarbonate level   Metabolic acidosis   Dehydration  Brief narrative/HPI 72 year old male with type 2 diabetes mellitus, CKD stage IV, hypertension, left BKA and dementia (?  Mild) presented with 3 days history of abdominal discomfort with vomiting and watery diarrhea.  Also reportedly had some confusion at home.  Blood work in the ED showed low potassium with baseline CKD and low bicarb of 14.  CT of the abdomen was done suggestive of enteritis.  Placed on observation for IV hydration and monitoring.    Hospital course   Principal Problem: Acute viral gastroenteritis. As seen on CT with mild ileus.  GI pathogen panel was sent on admission and negative. Abdominal exam is benign this morning.  Symptoms resolved since the morning of admission.  No further nausea, vomiting or diarrhea.  Received IV hydration and tolerating advance diet.   Antibiotics not required.   Active Problems:   DM2 (diabetes mellitus, type 2) uncontrolled with hyperglycemia ([place]) Resume home dose insulin.  Essential hypertension Continue Coreg, resume BiDil and Lasix.    CKD  (chronic kidney disease) stage 4, GFR 15-29 ml/min (HCC) Renal function mildly worsened on presentation from baseline likely due to dehydration.  Improved and better from baseline this morning.  Chronic metabolic acidosis Has chronically low bicarb.  Patient on oral bicarb at home which is continued at 650 mg 3 times daily.    CO2 was down to 13 and received D5 with bicarb drip after which it improved to 17 this morning.  Continue home bicarb supplement and follow-up with nephrologist.  Chronic combined systolic and diastolic  CHF Last EF of 61-95% with grade 1 diastolic dysfunction.  Patient is hypovolemic and received IV fluids.  Continue statin, beta-blocker, BiDil and Lasix.  Hypokalemia Replenished  History of seizure disorder Continue Lamictal.     Family Communication  :  Wife and daughter at bedside  Disposition Plan  : Home   Consults  : None  Procedures  : CT abdomen  Discharge Instructions   Allergies as of 12/19/2017      Reactions   Codeine Anaphylaxis   Penicillins Anaphylaxis, Swelling, Rash, Other (See Comments)   PATIENT HAD A PCN REACTION WITH IMMEDIATE RASH, FACIAL/TONGUE/THROAT SWELLING, SOB, OR LIGHTHEADEDNESS WITH HYPOTENSION:  #  #  #  YES  #  #  #  Has patient had a PCN reaction causing severe rash involving mucus membranes or skin necrosis: No Has patient had a PCN reaction that required hospitalization: Unknown Has patient had a PCN reaction occurring within the last 10 years: No If all of the above answers are "NO", then may proceed with Cephalosporin use.      Medication List  TAKE these medications   acetaminophen 500 MG tablet Commonly known as:  TYLENOL Take 500 mg by mouth 2 (two) times daily as needed for moderate pain or headache.   ASPERCREME LIDOCAINE EX Apply 1 application topically daily as needed (for pain).   atorvastatin 20 MG tablet Commonly known as:  LIPITOR Take 20 mg by mouth every evening.   BIDIL 20-37.5 MG  tablet Generic drug:  isosorbide-hydrALAZINE TAKE 1 TABLET BY MOUTH TWICE DAILY   carvedilol 6.25 MG tablet Commonly known as:  COREG Take 1 tablet (6.25 mg total) by mouth 2 (two) times daily with a meal.   CENTRUM SILVER ADULT 50+ PO Take 1 tablet by mouth daily.   clopidogrel 75 MG tablet Commonly known as:  PLAVIX Take 1 tablet (75 mg total) by mouth daily. May restart in  Two days. What changed:  additional instructions   docusate sodium 100 MG capsule Commonly known as:  COLACE Take 1 capsule (100 mg total) by mouth 2 (two) times daily.   FREESTYLE LIBRE 14 DAY SENSOR Misc 1 patch every 14 (fourteen) days.   furosemide 40 MG tablet Commonly known as:  LASIX Take 40 mg by mouth daily.   gabapentin 100 MG capsule Commonly known as:  NEURONTIN Take 300 mg by mouth at bedtime.   insulin detemir 100 UNIT/ML injection Commonly known as:  LEVEMIR Inject 0.05 mLs (5 Units total) into the skin at bedtime. What changed:  how much to take   insulin lispro 100 UNIT/ML injection Commonly known as:  HUMALOG Inject 0-6 Units into the skin 3 (three) times daily before meals. Sliding scale   lamoTRIgine 100 MG tablet Commonly known as:  LAMICTAL TAKE 1 TABLET BY MOUTH TWO  TIMES DAILY   megestrol 40 MG tablet Commonly known as:  MEGACE Take 40 mg by mouth 2 (two) times daily.   memantine 10 MG tablet Commonly known as:  NAMENDA TAKE 1 TABLET BY MOUTH TWO  TIMES DAILY   ondansetron 4 MG tablet Commonly known as:  ZOFRAN Take 1 tablet (4 mg total) by mouth every 8 (eight) hours as needed for nausea or vomiting.   pentoxifylline 400 MG CR tablet Commonly known as:  TRENTAL Take 1 tablet (400 mg total) by mouth 3 (three) times daily with meals.   polyethylene glycol packet Commonly known as:  MIRALAX / GLYCOLAX Take 17 g by mouth daily as needed for mild constipation.   silver sulfADIAZINE 1 % cream Commonly known as:  SILVADENE Apply 1 application topically daily  as needed (sores).   SODIUM BICARBONATE PO Take 1 tablet by mouth 3 (three) times daily.   tamsulosin 0.4 MG Caps capsule Commonly known as:  FLOMAX Take 1 capsule (0.4 mg total) by mouth daily after supper.   timolol 0.5 % ophthalmic solution Commonly known as:  BETIMOL Place 1 drop into both eyes 2 (two) times daily.   Travoprost (BAK Free) 0.004 % Soln ophthalmic solution Commonly known as:  [name] Place 1 drop into both eyes at bedtime.   venlafaxine XR 75 MG 24 hr capsule Commonly known as:  EFFEXOR-XR Take 75 mg by mouth daily.   vitamin C 500 MG tablet Commonly known as:  ASCORBIC ACID Take 500 mg by mouth daily.      Follow-up Information    Deland Pretty, MD. Schedule an appointment as soon as possible for a visit in 1 week(s).   Specialty:  Internal Medicine Contact information: [address] [place]        Josue Hector, MD .   Specialty:  Cardiology Contact information: [phone] [place] [address] [phone]          Allergies  Allergen Reactions  . Codeine Anaphylaxis  . Penicillins Anaphylaxis, Swelling, Rash and Other (See Comments)    PATIENT HAD A PCN REACTION WITH IMMEDIATE RASH, FACIAL/TONGUE/THROAT SWELLING, SOB, OR LIGHTHEADEDNESS WITH HYPOTENSION:  #  #  #  YES  #  #  #  Has patient had a PCN reaction causing severe rash involving mucus membranes or skin necrosis: No Has patient had a PCN reaction that required hospitalization: Unknown Has patient had a PCN reaction occurring within the last 10 years: No If all of the above answers are "NO", then may proceed with Cephalosporin use.        Procedures/Studies: Ct Abdomen Pelvis Wo Contrast  Result Date: 12/18/2017 CLINICAL DATA:  Nausea, vomiting and diarrhea. Weakness. History of inguinal hernia repair, partial colectomy, diabetes. EXAM: CT ABDOMEN AND PELVIS WITHOUT CONTRAST TECHNIQUE: Multidetector CT  imaging of the abdomen and pelvis was performed following the standard protocol without IV contrast. COMPARISON:  CT abdomen and pelvis June 13, 2017 FINDINGS: LOWER CHEST: Dependent atelectasis. The visualized heart size is normal. Mild coronary artery calcification. No pericardial effusion. HEPATOBILIARY: Negative liver. Punctate dependent cholelithiasis at the gallbladder neck. PANCREAS: Normal. SPLEEN: Normal. ADRENALS/URINARY TRACT: Kidneys are orthotopic, demonstrating normal size and morphology. No nephrolithiasis, hydronephrosis; limited assessment for renal masses by nonenhanced CT. The unopacified ureters are normal in course and caliber. Urinary bladder is partially distended and unremarkable. Normal adrenal glands. STOMACH/BOWEL: Small and large bowel air-fluid levels, top-normal small bowel at 2.8 cm. Enteric contrast has not yet reached the distal small bowel. Status post RIGHT hemicolectomy. Debris and contrast distended stomach. VASCULAR/LYMPHATIC: Aortoiliac vessels are normal in course and caliber. Mild calcific atherosclerosis. No lymphadenopathy by CT size criteria. REPRODUCTIVE: Prostatomegaly invading base of bladder. OTHER: No intraperitoneal free fluid or free air. MUSCULOSKELETAL: Non-acute. Streak artifact from LEFT hip femoral neck pinning. Severe degenerative change of the RIGHT hip with old acetabular fracture. Old RIGHT superior and inferior pubic rami fractures. Advanced lower lumbar facet arthropathy. IMPRESSION: 1. Small and large bowel air-fluid levels compatible with enteritis. Mild ileus. 2. Cholelithiasis, no CT findings of acute cholecystitis. Aortic Atherosclerosis (ICD10-I70.0). Electronically Signed   By: Elon Alas M.D.   On: 12/18/2017 01:47   Dg Chest 2 View  Result Date: 12/17/2017 CLINICAL DATA:  Fever EXAM: CHEST - 2 VIEW COMPARISON:  08/10/2017 FINDINGS: Lungs are clear.  No pleural effusion or pneumothorax. The heart is normal in size. Visualized osseous  structures are within normal limits. IMPRESSION: Normal chest radiographs. Electronically Signed   By: Julian Hy M.D.   On: 12/17/2017 22:57    (Echo, Carotid, EGD, Colonoscopy, ERCP)    Subjective:   Discharge Exam: Vitals:   12/18/17 2033 12/19/17 0619  BP: (!) 154/68 (!) 183/69  Pulse: 65 64  Resp: 16 16  Temp: 99.3 F (37.4 C) 98.6 F (37 C)  SpO2: 100% 100%   Vitals:   12/18/17 0315 12/18/17 0405 12/18/17 2033 12/19/17 0619  BP: (!) 143/62 128/63 (!) 154/68 (!) 183/69  Pulse: 76 79 65 64  Resp: 14 16 16 16   Temp:  98.8 F (37.1 C) 99.3 F (37.4 C) 98.6 F (37 C)  TempSrc:  Oral Oral Oral  SpO2: 99% 100% 100% 100%  Weight:  61.8  kg    Height:        General: Pt is alert, awake, not in acute distress Cardiovascular: RRR, S1/S2 +, no rubs, no gallops Respiratory: CTA bilaterally, no wheezing, no rhonchi Abdominal: Soft, NT, ND, bowel sounds + Extremities: no edema, no cyanosis    The results of significant diagnostics from this hospitalization (including imaging, microbiology, ancillary and laboratory) are listed below for reference.     Microbiology: Recent Results (from the past 240 hour(s))  Urine culture     Status: Abnormal (Preliminary result)   Collection Time: 12/17/17 10:16 PM  Result Value Ref Range Status   Specimen Description URINE, CLEAN CATCH  Final   Special Requests NONE  Final   Culture (A)  Final    >=100,000 COLONIES/mL PROTEUS MIRABILIS SUSCEPTIBILITIES TO FOLLOW Performed at [facility], [address] [place], [place] [address]    Report Status PENDING  Incomplete  Blood Culture (routine x 2)     Status: None (Preliminary result)   Collection Time: 12/18/17 12:45 AM  Result Value Ref Range Status   Specimen Description BLOOD RIGHT ANTECUBITAL  Final   Special Requests   Final    BOTTLES DRAWN AEROBIC AND ANAEROBIC Blood Culture results may not be optimal due to an excessive volume of blood received in culture  bottles   Culture   Final    NO GROWTH 1 DAY Performed at [facility], [place] [address] [place], [place] [address]    Report Status PENDING  Incomplete  Blood Culture (routine x 2)     Status: None (Preliminary result)   Collection Time: 12/18/17 12:55 AM  Result Value Ref Range Status   Specimen Description BLOOD LEFT WRIST  Final   Special Requests   Final    BOTTLES DRAWN AEROBIC ONLY Blood Culture results may not be optimal due to an excessive volume of blood received in culture bottles   Culture   Final    NO GROWTH 1 DAY Performed at [facility], [place] [address], [place], [place] [address]    Report Status PENDING  Incomplete  Gastrointestinal Panel by PCR , Stool     Status: None   Collection Time: 12/18/17  4:43 AM  Result Value Ref Range Status   Campylobacter species NOT DETECTED NOT DETECTED Final   Plesimonas shigelloides NOT DETECTED NOT DETECTED Final   Salmonella species NOT DETECTED NOT DETECTED Final   Yersinia enterocolitica NOT DETECTED NOT DETECTED Final   Vibrio species NOT DETECTED NOT DETECTED Final   Vibrio cholerae NOT DETECTED NOT DETECTED Final   Enteroaggregative E coli (EAEC) NOT DETECTED NOT DETECTED Final   Enteropathogenic E coli (EPEC) NOT DETECTED NOT DETECTED Final   Enterotoxigenic E coli (ETEC) NOT DETECTED NOT DETECTED Final   Shiga like toxin producing E coli (STEC) NOT DETECTED NOT DETECTED Final   Shigella/Enteroinvasive E coli (EIEC) NOT DETECTED NOT DETECTED Final   Cryptosporidium NOT DETECTED NOT DETECTED Final   Cyclospora cayetanensis NOT DETECTED NOT DETECTED Final   Entamoeba histolytica NOT DETECTED NOT DETECTED Final   Giardia lamblia NOT DETECTED NOT DETECTED Final   Adenovirus F40/41 NOT DETECTED NOT DETECTED Final   Astrovirus NOT DETECTED NOT DETECTED Final   Norovirus GI/GII NOT DETECTED NOT DETECTED Final   Rotavirus A NOT DETECTED NOT DETECTED Final   Sapovirus (I, II, IV, and V) NOT DETECTED NOT DETECTED  Final    Comment: Performed at [facility], [address] [place], [place] [address]     Labs: BNP (last 3 results) Recent Labs    05/06/17 2042  BNP 07.6   Basic Metabolic Panel: Recent Labs  Lab 12/17/17 0449 12/17/17 2304 12/18/17 0432 12/18/17 1107 12/19/17 0401  NA 138 139 135 135 137  K 4.4 3.3* 3.5 4.3 4.0  CL 107 115* 116* 114* 112*  CO2 19* 15* 14* 13* 17*  GLUCOSE 122* 102* 108* 173* 160*  BUN 55* 50* 47* 42* 31*  CREATININE 3.02* 2.77* 2.66* 2.53* 2.01*  CALCIUM 9.4 8.1* 7.5* 7.5* 7.8*   Liver Function Tests: Recent Labs  Lab 12/17/17 0449  AST 48*  ALT 13  ALKPHOS 52  BILITOT 1.3*  PROT 6.7  ALBUMIN 3.8   Recent Labs  Lab 12/17/17 0449  LIPASE 43   No results for input(s): AMMONIA in the last 168 hours. CBC: Recent Labs  Lab 12/17/17 0449 12/17/17 2304  WBC 9.4 8.4  HGB 12.3* 10.6*  HCT 39.7 34.9*  MCV 97.1 98.6  PLT 224 210   Cardiac Enzymes: No results for input(s): CKTOTAL, CKMB, CKMBINDEX, TROPONINI in the last 168 hours. BNP: Invalid input(s): POCBNP CBG: Recent Labs  Lab 12/18/17 0833 12/18/17 1208 12/18/17 1631 12/18/17 2029 12/19/17 0816  GLUCAP 94 181* 92 173* 149*   D-Dimer No results for input(s): DDIMER in the last 72 hours. Hgb A1c No results for input(s): HGBA1C in the last 72 hours. Lipid Profile No results for input(s): CHOL, HDL, LDLCALC, TRIG, CHOLHDL, LDLDIRECT in the last 72 hours. Thyroid function studies No results for input(s): TSH, T4TOTAL, T3FREE, THYROIDAB in the last 72 hours.  Invalid input(s): FREET3 Anemia work up No results for input(s): VITAMINB12, FOLATE, FERRITIN, TIBC, IRON, RETICCTPCT in the last 72 hours. Urinalysis    Component Value Date/Time   COLORURINE YELLOW 12/17/2017 2314   APPEARANCEUR HAZY (A) 12/17/2017 2314   LABSPEC 1.014 12/17/2017 2314   PHURINE 5.0 12/17/2017 2314   GLUCOSEU NEGATIVE 12/17/2017 2314   HGBUR NEGATIVE 12/17/2017 2314   BILIRUBINUR  NEGATIVE 12/17/2017 2314   KETONESUR NEGATIVE 12/17/2017 2314   PROTEINUR 100 (A) 12/17/2017 2314   UROBILINOGEN 0.2 12/07/2012 1140   NITRITE NEGATIVE 12/17/2017 2314   LEUKOCYTESUR NEGATIVE 12/17/2017 2314   Sepsis Labs Invalid input(s): PROCALCITONIN,  WBC,  LACTICIDVEN Microbiology Recent Results (from the past 240 hour(s))  Urine culture     Status: Abnormal (Preliminary result)   Collection Time: 12/17/17 10:16 PM  Result Value Ref Range Status   Specimen Description URINE, CLEAN CATCH  Final   Special Requests NONE  Final   Culture (A)  Final    >=100,000 COLONIES/mL PROTEUS MIRABILIS SUSCEPTIBILITIES TO FOLLOW Performed at [facility], [address] [place], [place] [address]    Report Status PENDING  Incomplete  Blood Culture (routine x 2)     Status: None (Preliminary result)   Collection Time: 12/18/17 12:45 AM  Result Value Ref Range Status   Specimen Description BLOOD RIGHT ANTECUBITAL  Final   Special Requests   Final    BOTTLES DRAWN AEROBIC AND ANAEROBIC Blood Culture results may not be optimal due to an excessive volume of blood received in culture bottles   Culture   Final    NO GROWTH 1 DAY Performed at [facility], [place] [address] [place], [place] [address]    Report Status PENDING  Incomplete  Blood Culture (routine x 2)     Status: None (Preliminary result)   Collection  Time: 12/18/17 12:55 AM  Result Value Ref Range Status   Specimen Description BLOOD LEFT WRIST  Final   Special Requests   Final    BOTTLES DRAWN AEROBIC ONLY Blood Culture results may not be optimal due to an excessive volume of blood received in culture bottles   Culture   Final    NO GROWTH 1 DAY Performed at [place] [address] [place], [place] [address]    Report Status PENDING  Incomplete  Gastrointestinal Panel by PCR , Stool     Status: None   Collection Time: 12/18/17  4:43 AM  Result Value Ref Range Status   Campylobacter species NOT  DETECTED NOT DETECTED Final   Plesimonas shigelloides NOT DETECTED NOT DETECTED Final   Salmonella species NOT DETECTED NOT DETECTED Final   Yersinia enterocolitica NOT DETECTED NOT DETECTED Final   Vibrio species NOT DETECTED NOT DETECTED Final   Vibrio cholerae NOT DETECTED NOT DETECTED Final   Enteroaggregative E coli (EAEC) NOT DETECTED NOT DETECTED Final   Enteropathogenic E coli (EPEC) NOT DETECTED NOT DETECTED Final   Enterotoxigenic E coli (ETEC) NOT DETECTED NOT DETECTED Final   Shiga like toxin producing E coli (STEC) NOT DETECTED NOT DETECTED Final   Shigella/Enteroinvasive E coli (EIEC) NOT DETECTED NOT DETECTED Final   Cryptosporidium NOT DETECTED NOT DETECTED Final   Cyclospora cayetanensis NOT DETECTED NOT DETECTED Final   Entamoeba histolytica NOT DETECTED NOT DETECTED Final   Giardia lamblia NOT DETECTED NOT DETECTED Final   Adenovirus F40/41 NOT DETECTED NOT DETECTED Final   Astrovirus NOT DETECTED NOT DETECTED Final   Norovirus GI/GII NOT DETECTED NOT DETECTED Final   Rotavirus A NOT DETECTED NOT DETECTED Final   Sapovirus (I, II, IV, and V) NOT DETECTED NOT DETECTED Final    Comment: Performed at [facility], [address] [place], [place] [address]     Time coordinating discharge: <30 minutes  SIGNED:   Louellen Molder, MD  [facility] 12/19/2017, 11:01 AM Pager   If 7PM-7AM, please contact night-coverage [email or web address] Password [email or web address]

## 2017-12-19 NOTE — Progress Notes (Signed)
I was asked to see the patient by his family with whom I am very familiar, because of concerns that the patient had developed a fistula related to his previous [facility] repair with mesh done in May.  The patient has no abdominal pain or swelling in the [facility] repair area.  The skin is normal.  He has no pain or tenderness at the hernia repair site.  Typically fistulas related to hernia repairs are manifested by infections and drainage.  There is nothing of this sort in this case, and the CT scan of the abdomen does not suggest a fistula but he does have an ileus..  I did not examine his anorectal area, but if he has perianal pain, it is possible that  He has a fistula related to his recent diarrhea.  No reason you cannot advance his diet.    Kathryne Eriksson. Dahlia Bailiff, MD, [place] [phone] [facility]

## 2017-12-20 LAB — URINE CULTURE

## 2017-12-21 DIAGNOSIS — R3915 Urgency of urination: Secondary | ICD-10-CM | POA: Diagnosis not present

## 2017-12-23 LAB — CULTURE, BLOOD (ROUTINE X 2)
Culture: NO GROWTH
Culture: NO GROWTH

## 2017-12-25 DIAGNOSIS — R350 Frequency of micturition: Secondary | ICD-10-CM | POA: Diagnosis not present

## 2017-12-25 DIAGNOSIS — R3915 Urgency of urination: Secondary | ICD-10-CM | POA: Diagnosis not present

## 2017-12-26 ENCOUNTER — Encounter (HOSPITAL_COMMUNITY)
Admission: RE | Admit: 2017-12-26 | Discharge: 2017-12-26 | Disposition: A | Discharge status: Home / Self Care | Payer: Medicare Other | Source: Ambulatory Visit | Attending: Nephrology | Admitting: Nephrology

## 2017-12-26 VITALS — BP 137/75 | HR 89 | Temp 98.4°F | Resp 20

## 2017-12-26 DIAGNOSIS — D631 Anemia in chronic kidney disease: Secondary | ICD-10-CM | POA: Insufficient documentation

## 2017-12-26 DIAGNOSIS — Z5181 Encounter for therapeutic drug level monitoring: Secondary | ICD-10-CM | POA: Insufficient documentation

## 2017-12-26 DIAGNOSIS — N184 Chronic kidney disease, stage 4 (severe): Secondary | ICD-10-CM | POA: Insufficient documentation

## 2017-12-26 DIAGNOSIS — Z79899 Other long term (current) drug therapy: Secondary | ICD-10-CM | POA: Insufficient documentation

## 2017-12-26 LAB — IRON AND TIBC
IRON: 88 ug/dL (ref 45–182)
Saturation Ratios: 40 % — ABNORMAL HIGH (ref 17.9–39.5)
TIBC: 223 ug/dL — ABNORMAL LOW (ref 250–450)
UIBC: 135 ug/dL

## 2017-12-26 LAB — FERRITIN: Ferritin: 96 ng/mL (ref 24–336)

## 2017-12-26 MED ORDER — DARBEPOETIN ALFA 150 MCG/0.3ML IJ SOSY
PREFILLED_SYRINGE | INTRAMUSCULAR | Status: AC
  Administered 2017-12-26: 150 ug via SUBCUTANEOUS
  Filled 2017-12-26: qty 0.3

## 2017-12-26 MED ORDER — DARBEPOETIN ALFA 150 MCG/0.3ML IJ SOSY
150.0000 ug | PREFILLED_SYRINGE | INTRAMUSCULAR | Status: DC
  Administered 2017-12-26: 150 ug via SUBCUTANEOUS

## 2017-12-28 DIAGNOSIS — E1122 Type 2 diabetes mellitus with diabetic chronic kidney disease: Secondary | ICD-10-CM | POA: Diagnosis not present

## 2017-12-28 DIAGNOSIS — E118 Type 2 diabetes mellitus with unspecified complications: Secondary | ICD-10-CM | POA: Diagnosis not present

## 2017-12-28 DIAGNOSIS — Z09 Encounter for follow-up examination after completed treatment for conditions other than malignant neoplasm: Secondary | ICD-10-CM | POA: Diagnosis not present

## 2017-12-28 DIAGNOSIS — R3915 Urgency of urination: Secondary | ICD-10-CM | POA: Diagnosis not present

## 2017-12-28 DIAGNOSIS — I10 Essential (primary) hypertension: Secondary | ICD-10-CM | POA: Diagnosis not present

## 2017-12-28 LAB — POCT HEMOGLOBIN-HEMACUE: HEMOGLOBIN: 11 g/dL — AB (ref 13.0–17.0)

## 2017-12-31 DIAGNOSIS — E1122 Type 2 diabetes mellitus with diabetic chronic kidney disease: Secondary | ICD-10-CM | POA: Diagnosis not present

## 2017-12-31 DIAGNOSIS — Z794 Long term (current) use of insulin: Secondary | ICD-10-CM | POA: Diagnosis not present

## 2018-01-01 DIAGNOSIS — I10 Essential (primary) hypertension: Secondary | ICD-10-CM | POA: Diagnosis not present

## 2018-01-01 DIAGNOSIS — E785 Hyperlipidemia, unspecified: Secondary | ICD-10-CM | POA: Diagnosis not present

## 2018-01-01 DIAGNOSIS — R911 Solitary pulmonary nodule: Secondary | ICD-10-CM | POA: Diagnosis not present

## 2018-01-01 DIAGNOSIS — E1121 Type 2 diabetes mellitus with diabetic nephropathy: Secondary | ICD-10-CM | POA: Diagnosis not present

## 2018-01-01 DIAGNOSIS — Z0000 Encounter for general adult medical examination without abnormal findings: Secondary | ICD-10-CM | POA: Diagnosis not present

## 2018-01-07 ENCOUNTER — Emergency Department (HOSPITAL_COMMUNITY): Payer: Medicare Other

## 2018-01-07 ENCOUNTER — Encounter (HOSPITAL_COMMUNITY): Payer: Self-pay

## 2018-01-07 ENCOUNTER — Emergency Department (HOSPITAL_COMMUNITY)
Admission: EM | Admit: 2018-01-07 | Discharge: 2018-01-08 | Disposition: A | Discharge status: Home / Self Care | Payer: Medicare Other | Attending: Emergency Medicine | Admitting: Emergency Medicine

## 2018-01-07 ENCOUNTER — Other Ambulatory Visit: Payer: Self-pay

## 2018-01-07 DIAGNOSIS — I5042 Chronic combined systolic (congestive) and diastolic (congestive) heart failure: Secondary | ICD-10-CM | POA: Insufficient documentation

## 2018-01-07 DIAGNOSIS — E1122 Type 2 diabetes mellitus with diabetic chronic kidney disease: Secondary | ICD-10-CM | POA: Insufficient documentation

## 2018-01-07 DIAGNOSIS — I130 Hypertensive heart and chronic kidney disease with heart failure and stage 1 through stage 4 chronic kidney disease, or unspecified chronic kidney disease: Secondary | ICD-10-CM | POA: Insufficient documentation

## 2018-01-07 DIAGNOSIS — W19XXXA Unspecified fall, initial encounter: Secondary | ICD-10-CM

## 2018-01-07 DIAGNOSIS — Y939 Activity, unspecified: Secondary | ICD-10-CM | POA: Insufficient documentation

## 2018-01-07 DIAGNOSIS — Y929 Unspecified place or not applicable: Secondary | ICD-10-CM | POA: Insufficient documentation

## 2018-01-07 DIAGNOSIS — Z794 Long term (current) use of insulin: Secondary | ICD-10-CM | POA: Diagnosis not present

## 2018-01-07 DIAGNOSIS — Y999 Unspecified external cause status: Secondary | ICD-10-CM | POA: Insufficient documentation

## 2018-01-07 DIAGNOSIS — S0990XA Unspecified injury of head, initial encounter: Secondary | ICD-10-CM

## 2018-01-07 DIAGNOSIS — W0110XA Fall on same level from slipping, tripping and stumbling with subsequent striking against unspecified object, initial encounter: Secondary | ICD-10-CM | POA: Diagnosis not present

## 2018-01-07 DIAGNOSIS — N184 Chronic kidney disease, stage 4 (severe): Secondary | ICD-10-CM | POA: Diagnosis not present

## 2018-01-07 DIAGNOSIS — Z79899 Other long term (current) drug therapy: Secondary | ICD-10-CM | POA: Insufficient documentation

## 2018-01-07 DIAGNOSIS — Z7901 Long term (current) use of anticoagulants: Secondary | ICD-10-CM | POA: Diagnosis not present

## 2018-01-07 LAB — CBC
HCT: 38.1 % — ABNORMAL LOW (ref 39.0–52.0)
HEMOGLOBIN: 11.4 g/dL — AB (ref 13.0–17.0)
MCH: 30.4 pg (ref 26.0–34.0)
MCHC: 29.9 g/dL — ABNORMAL LOW (ref 30.0–36.0)
MCV: 101.6 fL — AB (ref 80.0–100.0)
Platelets: 292 10*3/uL (ref 150–400)
RBC: 3.75 MIL/uL — AB (ref 4.22–5.81)
RDW: 15.3 % (ref 11.5–15.5)
WBC: 6.2 10*3/uL (ref 4.0–10.5)
nRBC: 0 % (ref 0.0–0.2)

## 2018-01-07 LAB — BASIC METABOLIC PANEL
Anion gap: 7 (ref 5–15)
BUN: 55 mg/dL — AB (ref 8–23)
CHLORIDE: 108 mmol/L (ref 98–111)
CO2: 23 mmol/L (ref 22–32)
Calcium: 9.1 mg/dL (ref 8.9–10.3)
Creatinine, Ser: 2.94 mg/dL — ABNORMAL HIGH (ref 0.61–1.24)
GFR calc Af Amer: 23 mL/min — ABNORMAL LOW (ref >60)
GFR calc non Af Amer: 20 mL/min — ABNORMAL LOW (ref >60)
Glucose, Bld: 162 mg/dL — ABNORMAL HIGH (ref 70–99)
POTASSIUM: 4.7 mmol/L (ref 3.5–5.1)
SODIUM: 138 mmol/L (ref 135–145)

## 2018-01-07 NOTE — ED Triage Notes (Signed)
Pt here for a fall today where he tripped and hit the back of his head.  Pt is on Plavix for strokes in the past.  VAN negative, no neuro deficits.

## 2018-01-08 NOTE — ED Notes (Signed)
See providers notes
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
## 2018-01-08 NOTE — ED Notes (Signed)
Patient verbalizes understanding of discharge instructions. Opportunity for questioning and answers were provided. Armband removed by staff, pt discharged from ED home via POV.  
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
## 2018-01-10 ENCOUNTER — Encounter: Payer: Self-pay | Admitting: Cardiology

## 2018-01-10 ENCOUNTER — Ambulatory Visit: Payer: Medicare Other | Admitting: Cardiology

## 2018-01-10 VITALS — BP 120/78 | HR 74 | Ht 70.0 in | Wt 135.8 lb

## 2018-01-10 DIAGNOSIS — E782 Mixed hyperlipidemia: Secondary | ICD-10-CM | POA: Diagnosis not present

## 2018-01-10 DIAGNOSIS — I10 Essential (primary) hypertension: Secondary | ICD-10-CM | POA: Diagnosis not present

## 2018-01-10 DIAGNOSIS — R079 Chest pain, unspecified: Secondary | ICD-10-CM

## 2018-01-10 DIAGNOSIS — I5032 Chronic diastolic (congestive) heart failure: Secondary | ICD-10-CM

## 2018-01-10 NOTE — Patient Instructions (Signed)
Medication Instructions:  Your physician recommends that you continue on your current medications as directed. Please refer to the Current Medication list given to you today.  If you need a refill on your cardiac medications before your next appointment, please call your pharmacy.   Lab work: None ordered  If you have labs (blood work) drawn today and your tests are completely normal, you will receive your results only by: [name] MyChart Message (if you have MyChart) OR . A paper copy in the mail If you have any lab test that is abnormal or we need to change your treatment, we will call you to review the results.  Testing/Procedures: None ordered  Follow-Up: At [facility], you and your health needs are our priority.  As part of our continuing mission to provide you with exceptional heart care, we have created designated Provider Care Teams.  These Care Teams include your primary Cardiologist (physician) and Advanced Practice Providers (APPs -  Physician Assistants and Nurse Practitioners) who all work together to provide you with the care you need, when you need it. You will need a follow up appointment in 6 months.  Please call our office 2 months in advance to schedule this appointment.  You may see [name], MD or one of the following Advanced Practice Providers on your designated Care Team:   Truitt Merle, NP Cecilie Kicks, NP . Kathyrn Drown, NP  Any Other Special Instructions Will Be Listed Below (If Applicable).

## 2018-01-11 ENCOUNTER — Encounter: Payer: Self-pay | Admitting: Cardiology

## 2018-01-15 DIAGNOSIS — N2581 Secondary hyperparathyroidism of renal origin: Secondary | ICD-10-CM | POA: Diagnosis not present

## 2018-01-15 DIAGNOSIS — D631 Anemia in chronic kidney disease: Secondary | ICD-10-CM | POA: Diagnosis not present

## 2018-01-15 DIAGNOSIS — I129 Hypertensive chronic kidney disease with stage 1 through stage 4 chronic kidney disease, or unspecified chronic kidney disease: Secondary | ICD-10-CM | POA: Diagnosis not present

## 2018-01-15 DIAGNOSIS — N184 Chronic kidney disease, stage 4 (severe): Secondary | ICD-10-CM | POA: Diagnosis not present

## 2018-01-15 DIAGNOSIS — E1122 Type 2 diabetes mellitus with diabetic chronic kidney disease: Secondary | ICD-10-CM | POA: Diagnosis not present

## 2018-01-20 ENCOUNTER — Emergency Department (HOSPITAL_COMMUNITY)
Admission: EM | Admit: 2018-01-20 | Discharge: 2018-01-20 | Disposition: A | Discharge status: Home / Self Care | Payer: Medicare Other | Attending: Emergency Medicine | Admitting: Emergency Medicine

## 2018-01-20 ENCOUNTER — Emergency Department (HOSPITAL_COMMUNITY): Payer: Medicare Other

## 2018-01-20 ENCOUNTER — Encounter (HOSPITAL_COMMUNITY): Payer: Self-pay | Admitting: Emergency Medicine

## 2018-01-20 DIAGNOSIS — R55 Syncope and collapse: Secondary | ICD-10-CM | POA: Diagnosis not present

## 2018-01-20 DIAGNOSIS — R569 Unspecified convulsions: Secondary | ICD-10-CM | POA: Diagnosis not present

## 2018-01-20 DIAGNOSIS — Z794 Long term (current) use of insulin: Secondary | ICD-10-CM | POA: Insufficient documentation

## 2018-01-20 DIAGNOSIS — Z79899 Other long term (current) drug therapy: Secondary | ICD-10-CM | POA: Diagnosis not present

## 2018-01-20 DIAGNOSIS — I5042 Chronic combined systolic (congestive) and diastolic (congestive) heart failure: Secondary | ICD-10-CM | POA: Insufficient documentation

## 2018-01-20 DIAGNOSIS — I130 Hypertensive heart and chronic kidney disease with heart failure and stage 1 through stage 4 chronic kidney disease, or unspecified chronic kidney disease: Secondary | ICD-10-CM | POA: Insufficient documentation

## 2018-01-20 DIAGNOSIS — Z7902 Long term (current) use of antithrombotics/antiplatelets: Secondary | ICD-10-CM | POA: Insufficient documentation

## 2018-01-20 DIAGNOSIS — E1122 Type 2 diabetes mellitus with diabetic chronic kidney disease: Secondary | ICD-10-CM | POA: Diagnosis not present

## 2018-01-20 DIAGNOSIS — N184 Chronic kidney disease, stage 4 (severe): Secondary | ICD-10-CM | POA: Diagnosis not present

## 2018-01-20 DIAGNOSIS — Z743 Need for continuous supervision: Secondary | ICD-10-CM | POA: Diagnosis not present

## 2018-01-20 DIAGNOSIS — I951 Orthostatic hypotension: Secondary | ICD-10-CM | POA: Diagnosis not present

## 2018-01-20 DIAGNOSIS — R404 Transient alteration of awareness: Secondary | ICD-10-CM | POA: Diagnosis not present

## 2018-01-20 LAB — COMPREHENSIVE METABOLIC PANEL
ALBUMIN: 3.3 g/dL — AB (ref 3.5–5.0)
ALK PHOS: 51 U/L (ref 38–126)
ALT: 18 U/L (ref 0–44)
ANION GAP: 11 (ref 5–15)
AST: 21 U/L (ref 15–41)
BUN: 50 mg/dL — ABNORMAL HIGH (ref 8–23)
CALCIUM: 9.2 mg/dL (ref 8.9–10.3)
CHLORIDE: 109 mmol/L (ref 98–111)
CO2: 21 mmol/L — AB (ref 22–32)
Creatinine, Ser: 2.75 mg/dL — ABNORMAL HIGH (ref 0.61–1.24)
GFR calc Af Amer: 25 mL/min — ABNORMAL LOW (ref >60)
GFR calc non Af Amer: 22 mL/min — ABNORMAL LOW (ref >60)
GLUCOSE: 82 mg/dL (ref 70–99)
Potassium: 3.9 mmol/L (ref 3.5–5.1)
SODIUM: 141 mmol/L (ref 135–145)
Total Bilirubin: 0.6 mg/dL (ref 0.3–1.2)
Total Protein: 6.3 g/dL — ABNORMAL LOW (ref 6.5–8.1)

## 2018-01-20 LAB — I-STAT TROPONIN, ED: Troponin i, poc: 0 ng/mL (ref 0.00–0.08)

## 2018-01-20 LAB — CBC WITH DIFFERENTIAL/PLATELET
ABS IMMATURE GRANULOCYTES: 0.02 10*3/uL (ref 0.00–0.07)
BASOS ABS: 0.1 10*3/uL (ref 0.0–0.1)
Basophils Relative: 1 %
EOS PCT: 2 %
Eosinophils Absolute: 0.1 10*3/uL (ref 0.0–0.5)
HCT: 37.7 % — ABNORMAL LOW (ref 39.0–52.0)
HEMOGLOBIN: 11.3 g/dL — AB (ref 13.0–17.0)
Immature Granulocytes: 0 %
LYMPHS PCT: 10 %
Lymphs Abs: 0.7 10*3/uL (ref 0.7–4.0)
MCH: 29.3 pg (ref 26.0–34.0)
MCHC: 30 g/dL (ref 30.0–36.0)
MCV: 97.7 fL (ref 80.0–100.0)
Monocytes Absolute: 0.4 10*3/uL (ref 0.1–1.0)
Monocytes Relative: 6 %
NEUTROS ABS: 5.3 10*3/uL (ref 1.7–7.7)
NRBC: 0 % (ref 0.0–0.2)
Neutrophils Relative %: 81 %
Platelets: 215 10*3/uL (ref 150–400)
RBC: 3.86 MIL/uL — AB (ref 4.22–5.81)
RDW: 13.3 % (ref 11.5–15.5)
WBC: 6.6 10*3/uL (ref 4.0–10.5)

## 2018-01-20 LAB — CBG MONITORING, ED: Glucose-Capillary: 76 mg/dL (ref 70–99)

## 2018-01-20 NOTE — ED Notes (Addendum)
Ambulated pt in hallway with walker and prosthesis, pt did not have any difficulty, no gait issues, "felt normal" and walked about 60 ft.

## 2018-01-20 NOTE — ED Notes (Signed)
Pt stable, ambulatory, states understanding of discharge instructions 

## 2018-01-20 NOTE — Discharge Instructions (Signed)
Make sure you get up slowly and that you are staying hydrated.  If you start feeling dizzy or light headed make sure you sit down or lay down.  If you develop any chest pain, shortness of breath or racing heart please return immediately

## 2018-01-20 NOTE — ED Triage Notes (Signed)
Pt was at [place], had syncopal episode while sitting in chair (approx 5 min unresponsive). BP 80 systolic,  and CBG 286. EMS reports that once pt aroused, BP increased to 381 systolic. EMS reports no pain, no neuro deficits. Pt alert and oriented to place, date, but unaware of the events.
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
## 2018-01-23 ENCOUNTER — Ambulatory Visit (HOSPITAL_COMMUNITY)
Admission: RE | Admit: 2018-01-23 | Discharge: 2018-01-23 | Disposition: A | Discharge status: Home / Self Care | Payer: Medicare Other | Source: Ambulatory Visit | Attending: Nephrology | Admitting: Nephrology

## 2018-01-23 VITALS — BP 128/60 | HR 78 | Resp 16

## 2018-01-23 DIAGNOSIS — N184 Chronic kidney disease, stage 4 (severe): Secondary | ICD-10-CM | POA: Diagnosis not present

## 2018-01-23 LAB — RENAL FUNCTION PANEL
ALBUMIN: 3.1 g/dL — AB (ref 3.5–5.0)
Anion gap: 9 (ref 5–15)
BUN: 61 mg/dL — AB (ref 8–23)
CALCIUM: 8.9 mg/dL (ref 8.9–10.3)
CO2: 24 mmol/L (ref 22–32)
Chloride: 108 mmol/L (ref 98–111)
Creatinine, Ser: 2.85 mg/dL — ABNORMAL HIGH (ref 0.61–1.24)
GFR calc Af Amer: 24 mL/min — ABNORMAL LOW (ref >60)
GFR calc non Af Amer: 21 mL/min — ABNORMAL LOW (ref >60)
Glucose, Bld: 252 mg/dL — ABNORMAL HIGH (ref 70–99)
PHOSPHORUS: 4 mg/dL (ref 2.5–4.6)
Potassium: 4.2 mmol/L (ref 3.5–5.1)
Sodium: 141 mmol/L (ref 135–145)

## 2018-01-23 LAB — IRON AND TIBC
Iron: 85 ug/dL (ref 45–182)
Saturation Ratios: 37 % (ref 17.9–39.5)
TIBC: 230 ug/dL — ABNORMAL LOW (ref 250–450)
UIBC: 145 ug/dL

## 2018-01-23 LAB — FERRITIN: Ferritin: 62 ng/mL (ref 24–336)

## 2018-01-23 LAB — POCT HEMOGLOBIN-HEMACUE: Hemoglobin: 11.2 g/dL — ABNORMAL LOW (ref 13.0–17.0)

## 2018-01-23 MED ORDER — DARBEPOETIN ALFA 150 MCG/0.3ML IJ SOSY
150.0000 ug | PREFILLED_SYRINGE | INTRAMUSCULAR | Status: DC
  Administered 2018-01-23: 150 ug via SUBCUTANEOUS

## 2018-01-23 MED ORDER — DARBEPOETIN ALFA 150 MCG/0.3ML IJ SOSY
PREFILLED_SYRINGE | INTRAMUSCULAR | Status: AC
  Filled 2018-01-23: qty 0.3

## 2018-01-24 DIAGNOSIS — I951 Orthostatic hypotension: Secondary | ICD-10-CM | POA: Diagnosis not present

## 2018-01-28 ENCOUNTER — Encounter (INDEPENDENT_AMBULATORY_CARE_PROVIDER_SITE_OTHER): Payer: Self-pay | Admitting: Orthopedic Surgery

## 2018-01-28 ENCOUNTER — Ambulatory Visit (INDEPENDENT_AMBULATORY_CARE_PROVIDER_SITE_OTHER): Payer: Medicare Other | Admitting: Orthopedic Surgery

## 2018-01-28 VITALS — Ht 70.0 in | Wt 135.0 lb

## 2018-01-28 DIAGNOSIS — I87321 Chronic venous hypertension (idiopathic) with inflammation of right lower extremity: Secondary | ICD-10-CM | POA: Diagnosis not present

## 2018-01-28 DIAGNOSIS — L97511 Non-pressure chronic ulcer of other part of right foot limited to breakdown of skin: Secondary | ICD-10-CM | POA: Diagnosis not present
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
## 2018-02-01 ENCOUNTER — Emergency Department (HOSPITAL_COMMUNITY): Payer: Medicare Other

## 2018-02-01 ENCOUNTER — Encounter (HOSPITAL_COMMUNITY): Payer: Self-pay | Admitting: Emergency Medicine

## 2018-02-01 ENCOUNTER — Emergency Department (HOSPITAL_COMMUNITY)
Admission: EM | Admit: 2018-02-01 | Discharge: 2018-02-01 | Disposition: A | Discharge status: Home / Self Care | Payer: Medicare Other | Attending: Emergency Medicine | Admitting: Emergency Medicine

## 2018-02-01 ENCOUNTER — Other Ambulatory Visit: Payer: Self-pay

## 2018-02-01 DIAGNOSIS — I129 Hypertensive chronic kidney disease with stage 1 through stage 4 chronic kidney disease, or unspecified chronic kidney disease: Secondary | ICD-10-CM | POA: Diagnosis not present

## 2018-02-01 DIAGNOSIS — E1122 Type 2 diabetes mellitus with diabetic chronic kidney disease: Secondary | ICD-10-CM | POA: Diagnosis not present

## 2018-02-01 DIAGNOSIS — I130 Hypertensive heart and chronic kidney disease with heart failure and stage 1 through stage 4 chronic kidney disease, or unspecified chronic kidney disease: Secondary | ICD-10-CM | POA: Insufficient documentation

## 2018-02-01 DIAGNOSIS — S0081XA Abrasion of other part of head, initial encounter: Secondary | ICD-10-CM

## 2018-02-01 DIAGNOSIS — Z794 Long term (current) use of insulin: Secondary | ICD-10-CM | POA: Insufficient documentation

## 2018-02-01 DIAGNOSIS — Y999 Unspecified external cause status: Secondary | ICD-10-CM | POA: Diagnosis not present

## 2018-02-01 DIAGNOSIS — N184 Chronic kidney disease, stage 4 (severe): Secondary | ICD-10-CM | POA: Diagnosis not present

## 2018-02-01 DIAGNOSIS — S0990XA Unspecified injury of head, initial encounter: Secondary | ICD-10-CM | POA: Diagnosis not present

## 2018-02-01 DIAGNOSIS — W19XXXA Unspecified fall, initial encounter: Secondary | ICD-10-CM

## 2018-02-01 DIAGNOSIS — W01190A Fall on same level from slipping, tripping and stumbling with subsequent striking against furniture, initial encounter: Secondary | ICD-10-CM | POA: Diagnosis not present

## 2018-02-01 DIAGNOSIS — S01419A Laceration without foreign body of unspecified cheek and temporomandibular area, initial encounter: Secondary | ICD-10-CM | POA: Diagnosis not present

## 2018-02-01 DIAGNOSIS — S199XXA Unspecified injury of neck, initial encounter: Secondary | ICD-10-CM | POA: Diagnosis not present

## 2018-02-01 DIAGNOSIS — Y929 Unspecified place or not applicable: Secondary | ICD-10-CM | POA: Insufficient documentation

## 2018-02-01 DIAGNOSIS — I5042 Chronic combined systolic (congestive) and diastolic (congestive) heart failure: Secondary | ICD-10-CM | POA: Diagnosis not present

## 2018-02-01 DIAGNOSIS — E119 Type 2 diabetes mellitus without complications: Secondary | ICD-10-CM | POA: Diagnosis not present

## 2018-02-01 DIAGNOSIS — Y939 Activity, unspecified: Secondary | ICD-10-CM | POA: Diagnosis not present

## 2018-02-01 DIAGNOSIS — R220 Localized swelling, mass and lump, head: Secondary | ICD-10-CM | POA: Diagnosis not present

## 2018-02-01 LAB — BASIC METABOLIC PANEL
ANION GAP: 10 (ref 5–15)
BUN: 59 mg/dL — ABNORMAL HIGH (ref 8–23)
CO2: 23 mmol/L (ref 22–32)
Calcium: 9.4 mg/dL (ref 8.9–10.3)
Chloride: 109 mmol/L (ref 98–111)
Creatinine, Ser: 3.3 mg/dL — ABNORMAL HIGH (ref 0.61–1.24)
GFR calc non Af Amer: 18 mL/min — ABNORMAL LOW (ref >60)
GFR, EST AFRICAN AMERICAN: 20 mL/min — AB (ref >60)
GLUCOSE: 143 mg/dL — AB (ref 70–99)
Potassium: 3.9 mmol/L (ref 3.5–5.1)
Sodium: 142 mmol/L (ref 135–145)

## 2018-02-01 LAB — CBC
HEMATOCRIT: 41 % (ref 39.0–52.0)
HEMOGLOBIN: 12.8 g/dL — AB (ref 13.0–17.0)
MCH: 30.4 pg (ref 26.0–34.0)
MCHC: 31.2 g/dL (ref 30.0–36.0)
MCV: 97.4 fL (ref 80.0–100.0)
NRBC: 0 % (ref 0.0–0.2)
Platelets: 230 10*3/uL (ref 150–400)
RBC: 4.21 MIL/uL — ABNORMAL LOW (ref 4.22–5.81)
RDW: 13.2 % (ref 11.5–15.5)
WBC: 7.3 10*3/uL (ref 4.0–10.5)

## 2018-02-01 MED ORDER — CARVEDILOL 12.5 MG PO TABS: 6.2500 mg | ORAL_TABLET | Freq: Once | ORAL | Status: DC

## 2018-02-01 MED ORDER — ISOSORB DINITRATE-HYDRALAZINE 20-37.5 MG PO TABS
1.0000 | ORAL_TABLET | Freq: Two times a day (BID) | ORAL | Status: DC
  Filled 2018-02-01: qty 1

## 2018-02-01 MED ORDER — CARVEDILOL 12.5 MG PO TABS: 6.2500 mg | ORAL_TABLET | Freq: Two times a day (BID) | ORAL | Status: DC

## 2018-02-01 NOTE — ED Notes (Signed)
Ambulated pt on the unit pt ambulated well no complaints noted at this time

## 2018-02-01 NOTE — ED Notes (Signed)
Family at bedside. 

## 2018-02-01 NOTE — ED Triage Notes (Signed)
Per Pt he was at home and tripped over the side of his bed and hit his head on the rail.  Pt is on plavix.  Pt has small lac to nose an left cheek NAD noted at this time AOx4

## 2018-02-01 NOTE — ED Notes (Signed)
Patient given discharge instructions and verbalized understanding.  Patient stable to discharge at this time.  Patient is alert and oriented to baseline.  No distressed noted at this time.  All belongings taken with the patient at discharge.   

## 2018-02-01 NOTE — ED Provider Notes (Signed)
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 will see.  Labs, EKG, imaging ordered.  Patient appears well.  1:04 PM imaging was negative for any emergent problems  or fractures.  Lab work shows worsening and baseline creatinine.  Family updated and are encouraged to have this rechecked in about a week.  Family reports upcoming appointment with PCP.  Patient was ambulated and he did well per family.  I asked him if they were comfortable taking him home and they said yes.  Family will administer the rest of his home medications upon return home.  Patient was counseled on head injury precautions and symptoms that should indicate their return to the ED.  These include severe worsening headache, vision changes, confusion, loss of consciousness, trouble walking, nausea & vomiting, or weakness/tingling in extremities.     Final Clinical Impressions(s) / ED Diagnoses   Final diagnoses:  Fall, initial encounter  Minor head injury, initial encounter    Facial abrasion, initial encounter  Stage 4 chronic kidney disease ([place])   Patient with mechanical fall at home.  Minor head injury and facial abrasions, none which require wound repair.  CT imaging of the head, facial bones, and neck were negative for emergent injuries.  Lab work is otherwise reassuring.  Patient without syncope or concerning cardiac symptoms such as chest pain or shortness of breath.  EKG is nonischemic.  Patient has eaten and ambulated in the emergency department.  We feel that he is appropriate for discharge home at this time.  Family at bedside and is in agreement.  ED Discharge Orders    None       Carlisle Cater, Hershal Coria 02/01/18 1307    Quintella Reichert, MD 02/02/18 1105

## 2018-02-01 NOTE — Discharge Instructions (Signed)
Please read and follow all provided instructions.  Your diagnoses today include:  1. Fall, initial encounter   2. Minor head injury, initial encounter   3. Facial abrasion, initial encounter   4. Stage 4 chronic kidney disease ([place])     Tests performed today include:  CT scan of your head, facial bones, and neck that did not show any serious injury.  Blood counts and electrolytes - kidney function is slightly worse today than is had been and will need rechecked by your doctor in a week, please let his doctor know  Vital signs. See below for your results today.   Medications prescribed:   None  Take any prescribed medications only as directed.  Home care instructions:  Follow any educational materials contained in this packet.  BE VERY CAREFUL not to take multiple medicines containing Tylenol (also called acetaminophen). Doing so can lead to an overdose which can damage your liver and cause liver failure and possibly death.   Follow-up instructions: Please follow-up with your primary care provider in the next 7 days for further evaluation of your symptoms.   Return instructions:  SEEK IMMEDIATE MEDICAL ATTENTION IF:  There is confusion or drowsiness (although children frequently become drowsy after injury).   You cannot awaken the injured person.   You have more than one episode of vomiting.   You notice dizziness or unsteadiness which is getting worse, or inability to walk.   You have convulsions or unconsciousness.   You experience severe, persistent headaches not relieved by Tylenol.  You cannot use arms or legs normally.   There are changes in pupil sizes. (This is the black center in the colored part of the eye)   There is clear or bloody discharge from the nose or ears.   You have change in speech, vision, swallowing, or understanding.   Localized weakness, numbness, tingling, or change in bowel or bladder control.  You have any other emergent  concerns.  Additional Information: You have had a head injury which does not appear to require admission at this time.  Your vital signs today were: BP (!) 185/99    Pulse 77    Temp 98.3 F (36.8 C)    Resp 18    Ht 5\' 10"  (1.778 m)    Wt 61.2 kg    SpO2 100%    BMI 19.37 kg/m  If your blood pressure (BP) was elevated above 135/85 this visit, please have this repeated by your doctor within one month. --------------

## 2018-02-06 ENCOUNTER — Telehealth: Payer: Self-pay | Admitting: Neurology

## 2018-02-06 ENCOUNTER — Telehealth (INDEPENDENT_AMBULATORY_CARE_PROVIDER_SITE_OTHER): Payer: Self-pay | Admitting: Orthopedic Surgery

## 2018-02-06 NOTE — Telephone Encounter (Signed)
I called the son.  The patient is being considered for a plant-based diet, have no problem with that and this is likely to be more healthy for him.  I am not sure that he would actually improve his mental status which is what the son is hoping for.  I have no problem with him starting a diet such as this.

## 2018-02-06 NOTE — Telephone Encounter (Signed)
Pt son(on DPR-Haylen Lissa Merlin) is asking for Dr Tobey Grim thoughts on pt being on a monitored plant based diet, please call

## 2018-02-06 NOTE — Telephone Encounter (Signed)
Patients wife called to see if he should still be taking pentoxifylline? Please advise [name] # [phone]

## 2018-02-07 DIAGNOSIS — R350 Frequency of micturition: Secondary | ICD-10-CM | POA: Diagnosis not present

## 2018-02-07 DIAGNOSIS — R3915 Urgency of urination: Secondary | ICD-10-CM | POA: Diagnosis not present

## 2018-02-07 NOTE — Telephone Encounter (Signed)
Do you want this pt to continue with trental?

## 2018-02-07 NOTE — Telephone Encounter (Signed)
I called and sw pt's wife to advise of message below. Voiced understanding and will call with questions.

## 2018-02-07 NOTE — Telephone Encounter (Signed)
The Trental is a good medicine to take for the venous swelling.  Patient can get off the medication and see how he does.

## 2018-02-12 DIAGNOSIS — N2581 Secondary hyperparathyroidism of renal origin: Secondary | ICD-10-CM | POA: Diagnosis not present

## 2018-02-12 DIAGNOSIS — D631 Anemia in chronic kidney disease: Secondary | ICD-10-CM | POA: Diagnosis not present

## 2018-02-12 DIAGNOSIS — E1122 Type 2 diabetes mellitus with diabetic chronic kidney disease: Secondary | ICD-10-CM | POA: Diagnosis not present

## 2018-02-12 DIAGNOSIS — E875 Hyperkalemia: Secondary | ICD-10-CM | POA: Diagnosis not present

## 2018-02-12 DIAGNOSIS — N184 Chronic kidney disease, stage 4 (severe): Secondary | ICD-10-CM | POA: Diagnosis not present

## 2018-02-12 DIAGNOSIS — I129 Hypertensive chronic kidney disease with stage 1 through stage 4 chronic kidney disease, or unspecified chronic kidney disease: Secondary | ICD-10-CM | POA: Diagnosis not present

## 2018-02-13 DIAGNOSIS — I10 Essential (primary) hypertension: Secondary | ICD-10-CM | POA: Diagnosis not present

## 2018-02-13 DIAGNOSIS — E1121 Type 2 diabetes mellitus with diabetic nephropathy: Secondary | ICD-10-CM | POA: Diagnosis not present

## 2018-02-13 DIAGNOSIS — E785 Hyperlipidemia, unspecified: Secondary | ICD-10-CM | POA: Diagnosis not present

## 2018-02-13 DIAGNOSIS — Z8673 Personal history of transient ischemic attack (TIA), and cerebral infarction without residual deficits: Secondary | ICD-10-CM | POA: Diagnosis not present

## 2018-02-19 ENCOUNTER — Encounter (HOSPITAL_COMMUNITY): Payer: Self-pay | Admitting: Emergency Medicine

## 2018-02-19 ENCOUNTER — Emergency Department (HOSPITAL_COMMUNITY): Payer: Medicare Other

## 2018-02-19 ENCOUNTER — Emergency Department (HOSPITAL_COMMUNITY)
Admission: EM | Admit: 2018-02-19 | Discharge: 2018-02-19 | Disposition: A | Discharge status: Home / Self Care | Payer: Medicare Other | Attending: Emergency Medicine | Admitting: Emergency Medicine

## 2018-02-19 ENCOUNTER — Other Ambulatory Visit: Payer: Self-pay

## 2018-02-19 DIAGNOSIS — Y9301 Activity, walking, marching and hiking: Secondary | ICD-10-CM | POA: Diagnosis not present

## 2018-02-19 DIAGNOSIS — W0110XA Fall on same level from slipping, tripping and stumbling with subsequent striking against unspecified object, initial encounter: Secondary | ICD-10-CM | POA: Insufficient documentation

## 2018-02-19 DIAGNOSIS — Z7902 Long term (current) use of antithrombotics/antiplatelets: Secondary | ICD-10-CM | POA: Diagnosis not present

## 2018-02-19 DIAGNOSIS — Z79899 Other long term (current) drug therapy: Secondary | ICD-10-CM | POA: Diagnosis not present

## 2018-02-19 DIAGNOSIS — I5042 Chronic combined systolic (congestive) and diastolic (congestive) heart failure: Secondary | ICD-10-CM | POA: Diagnosis not present

## 2018-02-19 DIAGNOSIS — I130 Hypertensive heart and chronic kidney disease with heart failure and stage 1 through stage 4 chronic kidney disease, or unspecified chronic kidney disease: Secondary | ICD-10-CM | POA: Insufficient documentation

## 2018-02-19 DIAGNOSIS — Y999 Unspecified external cause status: Secondary | ICD-10-CM | POA: Insufficient documentation

## 2018-02-19 DIAGNOSIS — N184 Chronic kidney disease, stage 4 (severe): Secondary | ICD-10-CM | POA: Diagnosis not present

## 2018-02-19 DIAGNOSIS — Y92002 Bathroom of unspecified non-institutional (private) residence single-family (private) house as the place of occurrence of the external cause: Secondary | ICD-10-CM | POA: Diagnosis not present

## 2018-02-19 DIAGNOSIS — S0990XA Unspecified injury of head, initial encounter: Secondary | ICD-10-CM | POA: Diagnosis not present

## 2018-02-19 DIAGNOSIS — R51 Headache: Secondary | ICD-10-CM | POA: Diagnosis not present

## 2018-02-19 DIAGNOSIS — Z794 Long term (current) use of insulin: Secondary | ICD-10-CM | POA: Diagnosis not present

## 2018-02-19 DIAGNOSIS — E1122 Type 2 diabetes mellitus with diabetic chronic kidney disease: Secondary | ICD-10-CM | POA: Diagnosis not present
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
## 2018-02-19 NOTE — Discharge Instructions (Addendum)
Return to the ER for any concerning symptoms otherwise follow-up with your primary care provider.

## 2018-02-19 NOTE — ED Triage Notes (Signed)
Pt reports trip and fall in the bathroom today. NEg , LOC, alert, oriented x4, VSS. Takes plavix

## 2018-02-19 NOTE — ED Notes (Signed)
Patient verbalizes understanding of discharge instructions. Opportunity for questioning and answers were provided. Armband removed by staff, pt discharged from ED. Pt wheeled to lobby and taken home by family.

## 2018-02-20 ENCOUNTER — Telehealth: Payer: Self-pay

## 2018-02-20 ENCOUNTER — Ambulatory Visit (HOSPITAL_COMMUNITY)
Admission: RE | Admit: 2018-02-20 | Discharge: 2018-02-20 | Disposition: A | Discharge status: Home / Self Care | Payer: Medicare Other | Source: Ambulatory Visit | Attending: Nephrology | Admitting: Nephrology

## 2018-02-20 VITALS — BP 145/69 | HR 73 | Temp 98.2°F | Resp 20

## 2018-02-20 DIAGNOSIS — N184 Chronic kidney disease, stage 4 (severe): Secondary | ICD-10-CM | POA: Diagnosis not present

## 2018-02-20 LAB — IRON AND TIBC
Iron: 88 ug/dL (ref 45–182)
Saturation Ratios: 39 % (ref 17.9–39.5)
TIBC: 224 ug/dL — AB (ref 250–450)
UIBC: 136 ug/dL

## 2018-02-20 LAB — POCT HEMOGLOBIN-HEMACUE: HEMOGLOBIN: 12.5 g/dL — AB (ref 13.0–17.0)

## 2018-02-20 LAB — FERRITIN: Ferritin: 43 ng/mL (ref 24–336)

## 2018-02-20 MED ORDER — DARBEPOETIN ALFA 150 MCG/0.3ML IJ SOSY: 150.0000 ug | PREFILLED_SYRINGE | INTRAMUSCULAR | Status: DC

## 2018-02-20 NOTE — Telephone Encounter (Signed)
Phone call placed to patient. Spoke with his wife to schedule visit with Palliative Care. Visit scheduled for Friday 02/22/18

## 2018-02-21 ENCOUNTER — Other Ambulatory Visit: Payer: Self-pay

## 2018-02-21 NOTE — Patient Outreach (Signed)
[name]  02/21/2018   [name] 13-Jan-1946 [id number]  Telephone Screen Referral Date :02/20/2018 Referral Source:[facility] ED Census/UM referral Referral Reason:ED Utilization/Falls Insurance: [place] attempt 1 #  To patient for screening/UM referral and initial assessment. HIPAA verified by the patient. The information was completed by the patient and his wife [name].  Verbal consent was given by the patient and she is on the ROI.   Social: The patient lives in the home with his wife. His wife is very supportive and his caretaker. The patient is Independent /assist with his ADLS and Assist with his IADLS. The patient does not complain of any pain.  He has had 2-3 falls in the home due to instability.  Discussed fall precautions with his wife.  She states that the patient walks with a walker.  They have no loose rugs or cords in the home.  No pets in the home.  She states that the patient his bedroom and has a night light connected to the bed.  A commode chair in the room.  She states She would like  to see about getting the patient some grab bars and a chair to but in the shower. She also wants to see about a CNA coming in the home to help her.  I notified the patient that I will make a referral to social work. Caregiver verbalized understanding. Durable medical equipment in the home consist of a walker, blood pressure cuff and Libre meter.  Conditions: Per chart review and speaking with the caregiver the patient's conditions include CHF, HTN, DM Type II, CVA, Encephalopathy and Chronic kidney Disease stage 4.  The care giver states that his diabetes runs around 101-125 and his blood pressure is under control.  He is not weighing  himself daily due to the patient does not have  a scale. Caregiver monitors  his diet.  I talked with the care giver about signs and symptoms, and action plan for CHF.  She verbalized understanding.  The caregiver states  that she will have her first meeting with palliative care on 02/22/2018 to discuss his CKD.  The Caregiver states that the patient needs to have dialysis but would need to have surgery and he is not a candidate.  Medications:  Caregiver states that the patient is on about 14 meds. She handles his medications.  The caregiver states that she already has assistance with applications for Bidil and insulins.  Appointments: The patient has an appointment to meet with Palliative care on 02/22/2018.  Current Medications:  Current Outpatient Medications  Medication Sig Dispense Refill  . acetaminophen (TYLENOL) 500 MG tablet Take 500 mg by mouth 2 (two) times daily as needed for moderate pain or headache.    . ASPERCREME LIDOCAINE EX Apply 1 application topically daily as needed (for pain).     [name] atorvastatin (LIPITOR) 20 MG tablet Take 20 mg by mouth every evening.     [name] BIDIL 20-37.5 MG tablet TAKE 1 TABLET BY MOUTH TWICE DAILY (Patient taking differently: Take 1 tablet by mouth 3 (three) times daily. ) 180 tablet 3  . carvedilol (COREG) 6.25 MG tablet Take 1 tablet (6.25 mg total) by mouth 2 (two) times daily with a meal. 60 tablet 0  . clopidogrel (PLAVIX) 75 MG tablet Take 1 tablet (75 mg total) by mouth daily. May restart in  Two days. (Patient taking differently: Take 75 mg by mouth daily. ) 30 tablet 0  .  Continuous Blood Gluc Sensor (FREESTYLE LIBRE 14 DAY SENSOR) MISC 1 patch every 14 (fourteen) days.    [name] docusate sodium (COLACE) 100 MG capsule Take 1 capsule (100 mg total) by mouth 2 (two) times daily. 10 capsule 0  . finasteride (PROSCAR) 5 MG tablet Take 5 mg by mouth daily.     . furosemide (LASIX) 40 MG tablet Take 40 mg by mouth daily. Increase to 80 mg if excess fluid    . gabapentin (NEURONTIN) 100 MG capsule Take 300 mg by mouth at bedtime.     . insulin detemir (LEVEMIR) 100 UNIT/ML injection Inject 4 Units into the skin at bedtime.    . insulin lispro (HUMALOG) 100 UNIT/ML injection  Inject 0-6 Units into the skin 3 (three) times daily before meals. Sliding scale    . lamoTRIgine (LAMICTAL) 100 MG tablet TAKE 1 TABLET BY MOUTH TWO  TIMES DAILY (Patient taking differently: Take 100 mg by mouth 2 (two) times daily. ) 180 tablet 3  . memantine (NAMENDA) 10 MG tablet TAKE 1 TABLET BY MOUTH TWO  TIMES DAILY (Patient taking differently: Take 10 mg by mouth 2 (two) times daily. ) 180 tablet 3  . Multiple Vitamins-Minerals (CENTRUM SILVER ADULT 50+ PO) Take 1 tablet by mouth daily.     . polyethylene glycol (MIRALAX / GLYCOLAX) packet Take 17 g by mouth daily as needed for mild constipation.    . silver sulfADIAZINE (SILVADENE) 1 % cream Apply 1 application topically daily as needed (sores).     . sodium bicarbonate 650 MG tablet Take 650 mg by mouth 3 (three) times daily.  5  . tamsulosin (FLOMAX) 0.4 MG CAPS capsule Take 1 capsule (0.4 mg total) by mouth daily after supper. 30 capsule 5  . timolol (BETIMOL) 0.5 % ophthalmic solution Place 1 drop into both eyes 2 (two) times daily.     . Travoprost, BAK Free, ([name]) 0.004 % SOLN ophthalmic solution Place 1 drop into both eyes at bedtime.    [name] venlafaxine XR (EFFEXOR-XR) 75 MG 24 hr capsule Take 75 mg by mouth 2 (two) times daily.     . vitamin C (ASCORBIC ACID) 500 MG tablet Take 500 mg by mouth daily.    . megestrol (MEGACE) 40 MG tablet Take 40 mg by mouth 2 (two) times daily.  3  . pentoxifylline (TRENTAL) 400 MG CR tablet Take 1 tablet (400 mg total) by mouth 3 (three) times daily with meals. (Patient not taking: Reported on 02/21/2018) 90 tablet 3   No current facility-administered medications for this visit.     Functional Status:  In your present state of health, do you have any difficulty performing the following activities: 02/21/2018 12/18/2017  Hearing? N N  Vision? N N  Difficulty concentrating or making decisions? Y Y  Comment - -  Walking or climbing stairs? Y Y  Dressing or bathing? Y Y  Doing errands,  shopping? Y Y  Some recent data might be hidden    Fall/Depression Screening: Fall Risk  06/18/2013 06/18/2013 04/02/2013  Falls in the past year? No No No  Risk for fall due to : Impaired mobility - -   PHQ 2/9 Scores 02/21/2018 06/18/2013 06/18/2013 04/02/2013  PHQ - 2 Score 2 0 0 0  PHQ- 9 Score 7 - - -    Assessment: Patient will benefit from health coach outreach for disease [name] and support.   [facility] CM Care Plan Problem One     Most Recent Value  Care Plan Problem One  knowlledge deficit related to CHF  Role Documenting the Problem One  [facility] for Problem One  Active  [facility] Long Term Goal   In 3o days the caregiver will express a better understanding of diease [name].  [facility]N Long Term Goal Start Date  02/21/18  Interventions for Problem One Long Term Goal  Reviewed signs and symptoms of heart failure, discussed with caregiver chf action plan, encouraged caregiver to weigh daily,       Plan: RN Health Coach will provide ongoing education for patient on heart failure through phone calls and sending printed information to patient for further discussion.  RN Health Coach will send welcome packet with consent to patient as well as printed information on heart failure.  RN Health Coach will send initial barriers letter, assessment, and care plan to primary care physician. RN Health Coach will send referral to social work for [employer].   RN Health Coach will contact patient in the month of December and patient agrees to next outreach.    Lazaro Arms RN, BSN, [place] Direct Dial:  [phone] Fax: [phone]

## 2018-02-22 ENCOUNTER — Other Ambulatory Visit: Payer: Medicare Other | Admitting: Internal Medicine

## 2018-02-25 ENCOUNTER — Other Ambulatory Visit: Payer: Self-pay

## 2018-02-25 NOTE — Patient Outreach (Signed)
[facility]) Care Management  02/25/2018  [name]. Dec 13, 1945 [id number]   Initial outreach regarding social work referral for request for home modifications and in-home aide assistance.  BSW spoke with patient's spouse, Darrek Leasure.  Mrs. Meininger reported that they are in need of grab bars to be installed in bathroom.  Mr. Schroeter currently utilizes a shower chair but she said it is not as stable as they wish it to be so they would like a more "permanent" chair in the shower.   BSW and Ms. Podgorski completed three way call to [facility] for the purpose of submitting a referral.  They can assist with minor modifications such as the grab bars.  Referral information was gathered and Mrs. Schara was told that she will receive documentation via mail that needs to be completed and sent back.  BSW submitted referrals to [facility] for other requested modification.  BSW talked with Mrs. Boughner about when in-home aide services are covered by Medicare.  BSW inquired about whether or not they have applied for Medicaid.  Mrs. Schuneman reported that they applied a while back but were told they would need to "spend down" in order to qualify.  They decided not to pursue this at the time but she says that there have been some income changes since then so she intends to reapply.  BSW agreed to send her a list of local in-home providers that could be contacted about cost.  BSW informed her that these agencies are going to require an assessment.  BSW will follow up with her next week to ensure receipt of resources and that she has been contacted by referral sources.   Ronn Melena, BSW Social Worker [phone]

## 2018-02-26 ENCOUNTER — Ambulatory Visit (INDEPENDENT_AMBULATORY_CARE_PROVIDER_SITE_OTHER): Payer: Medicare Other | Admitting: Physician Assistant

## 2018-02-26 ENCOUNTER — Encounter (INDEPENDENT_AMBULATORY_CARE_PROVIDER_SITE_OTHER): Payer: Self-pay | Admitting: Physician Assistant

## 2018-02-26 VITALS — Ht 70.0 in | Wt 136.0 lb

## 2018-02-26 DIAGNOSIS — L97511 Non-pressure chronic ulcer of other part of right foot limited to breakdown of skin: Secondary | ICD-10-CM

## 2018-02-26 DIAGNOSIS — M14671 Charcot's joint, right ankle and foot: Secondary | ICD-10-CM | POA: Diagnosis not present

## 2018-02-26 DIAGNOSIS — I87321 Chronic venous hypertension (idiopathic) with inflammation of right lower extremity: Secondary | ICD-10-CM
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
## 2018-02-28 ENCOUNTER — Ambulatory Visit (INDEPENDENT_AMBULATORY_CARE_PROVIDER_SITE_OTHER): Payer: Medicare Other | Admitting: Orthopedic Surgery

## 2018-03-01 ENCOUNTER — Ambulatory Visit (INDEPENDENT_AMBULATORY_CARE_PROVIDER_SITE_OTHER): Payer: Medicare Other | Admitting: Physician Assistant

## 2018-03-04 DIAGNOSIS — Z794 Long term (current) use of insulin: Secondary | ICD-10-CM | POA: Diagnosis not present

## 2018-03-04 DIAGNOSIS — E1122 Type 2 diabetes mellitus with diabetic chronic kidney disease: Secondary | ICD-10-CM | POA: Diagnosis not present

## 2018-03-06 ENCOUNTER — Ambulatory Visit (HOSPITAL_COMMUNITY)
Admission: RE | Admit: 2018-03-06 | Discharge: 2018-03-06 | Disposition: A | Discharge status: Home / Self Care | Payer: Medicare Other | Source: Ambulatory Visit | Attending: Nephrology | Admitting: Nephrology

## 2018-03-06 VITALS — BP 162/72 | HR 70 | Temp 98.1°F | Resp 20

## 2018-03-06 DIAGNOSIS — N184 Chronic kidney disease, stage 4 (severe): Secondary | ICD-10-CM | POA: Insufficient documentation

## 2018-03-06 LAB — POCT HEMOGLOBIN-HEMACUE: HEMOGLOBIN: 11 g/dL — AB (ref 13.0–17.0)

## 2018-03-06 MED ORDER — DARBEPOETIN ALFA 100 MCG/0.5ML IJ SOSY
PREFILLED_SYRINGE | INTRAMUSCULAR | Status: AC
  Administered 2018-03-06: 100 ug via SUBCUTANEOUS
  Filled 2018-03-06: qty 0.5

## 2018-03-06 MED ORDER — DARBEPOETIN ALFA 100 MCG/0.5ML IJ SOSY
100.0000 ug | PREFILLED_SYRINGE | INTRAMUSCULAR | Status: DC
  Administered 2018-03-06: 100 ug via SUBCUTANEOUS

## 2018-03-08 ENCOUNTER — Other Ambulatory Visit: Payer: Self-pay

## 2018-03-08 NOTE — Patient Outreach (Signed)
[facility]) Care Management  03/08/2018  [name]. 03/13/1946 [id number]   Follow up call to patient's spouse, [name], regarding referrals submitted for home modification.  [name] has received paperwork from [facility] and [facility].  She is working to get paperwork completed and sent back to these agencies.  The referral to [employer] has been accepted but [name] has not yet been contacted by them.   BSW will follow up with her and referral sources within the next three weeks to get an update.   Ronn Melena, BSW Social Worker [phone]

## 2018-03-16 DIAGNOSIS — R7989 Other specified abnormal findings of blood chemistry: Secondary | ICD-10-CM | POA: Diagnosis not present

## 2018-03-16 DIAGNOSIS — R310 Gross hematuria: Secondary | ICD-10-CM | POA: Diagnosis not present

## 2018-03-16 DIAGNOSIS — I130 Hypertensive heart and chronic kidney disease with heart failure and stage 1 through stage 4 chronic kidney disease, or unspecified chronic kidney disease: Secondary | ICD-10-CM | POA: Diagnosis not present

## 2018-03-16 DIAGNOSIS — Z7902 Long term (current) use of antithrombotics/antiplatelets: Secondary | ICD-10-CM | POA: Diagnosis not present

## 2018-03-16 DIAGNOSIS — N189 Chronic kidney disease, unspecified: Secondary | ICD-10-CM | POA: Diagnosis not present

## 2018-03-16 DIAGNOSIS — I509 Heart failure, unspecified: Secondary | ICD-10-CM | POA: Diagnosis not present

## 2018-03-16 DIAGNOSIS — K5900 Constipation, unspecified: Secondary | ICD-10-CM | POA: Diagnosis not present

## 2018-03-16 DIAGNOSIS — E1122 Type 2 diabetes mellitus with diabetic chronic kidney disease: Secondary | ICD-10-CM | POA: Diagnosis not present

## 2018-03-18 ENCOUNTER — Other Ambulatory Visit: Payer: Self-pay

## 2018-03-18 MED ORDER — LAMOTRIGINE 100 MG PO TABS: 100.0000 mg | ORAL_TABLET | Freq: Two times a day (BID) | ORAL | 3 refills | Status: DC

## 2018-03-20 ENCOUNTER — Emergency Department (HOSPITAL_COMMUNITY)
Admission: EM | Admit: 2018-03-20 | Discharge: 2018-03-20 | Disposition: A | Discharge status: Home / Self Care | Payer: Medicare Other | Attending: Emergency Medicine | Admitting: Emergency Medicine

## 2018-03-20 ENCOUNTER — Emergency Department (HOSPITAL_COMMUNITY): Payer: Medicare Other

## 2018-03-20 ENCOUNTER — Other Ambulatory Visit: Payer: Self-pay

## 2018-03-20 DIAGNOSIS — S0990XA Unspecified injury of head, initial encounter: Secondary | ICD-10-CM | POA: Diagnosis not present

## 2018-03-20 DIAGNOSIS — S0181XA Laceration without foreign body of other part of head, initial encounter: Secondary | ICD-10-CM

## 2018-03-20 DIAGNOSIS — Y929 Unspecified place or not applicable: Secondary | ICD-10-CM | POA: Insufficient documentation

## 2018-03-20 DIAGNOSIS — Z794 Long term (current) use of insulin: Secondary | ICD-10-CM | POA: Insufficient documentation

## 2018-03-20 DIAGNOSIS — Y939 Activity, unspecified: Secondary | ICD-10-CM | POA: Diagnosis not present

## 2018-03-20 DIAGNOSIS — E119 Type 2 diabetes mellitus without complications: Secondary | ICD-10-CM | POA: Diagnosis not present

## 2018-03-20 DIAGNOSIS — Y999 Unspecified external cause status: Secondary | ICD-10-CM | POA: Diagnosis not present

## 2018-03-20 DIAGNOSIS — S01112A Laceration without foreign body of left eyelid and periocular area, initial encounter: Secondary | ICD-10-CM | POA: Diagnosis not present

## 2018-03-20 DIAGNOSIS — W06XXXA Fall from bed, initial encounter: Secondary | ICD-10-CM | POA: Diagnosis not present

## 2018-03-20 DIAGNOSIS — I130 Hypertensive heart and chronic kidney disease with heart failure and stage 1 through stage 4 chronic kidney disease, or unspecified chronic kidney disease: Secondary | ICD-10-CM | POA: Insufficient documentation

## 2018-03-20 DIAGNOSIS — I5042 Chronic combined systolic (congestive) and diastolic (congestive) heart failure: Secondary | ICD-10-CM | POA: Insufficient documentation

## 2018-03-20 DIAGNOSIS — Z79899 Other long term (current) drug therapy: Secondary | ICD-10-CM | POA: Diagnosis not present

## 2018-03-20 DIAGNOSIS — N183 Chronic kidney disease, stage 3 (moderate): Secondary | ICD-10-CM | POA: Diagnosis not present

## 2018-03-20 NOTE — ED Notes (Signed)
Pt had Blood sugar of 184 per his own equip,emt. Dr. Eulis Foster aware.
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
## 2018-03-20 NOTE — ED Notes (Signed)
Pt and family states they understand instructions. Pt home stable where he will continue his regular medicaines.

## 2018-03-20 NOTE — ED Notes (Signed)
Wound at left eyebrow qand left check covered with dressing after cheek cleaned.wound at eyebrow covered as family has concerns about pt "picking at "wound/glue.

## 2018-03-20 NOTE — Discharge Instructions (Signed)
Wound was closed with wound glue, which does not require particular treatment.  Consider talking to his doctor about recurrent falls and see if there is anything else he can offer to help.  Return here, if needed, for problems.

## 2018-03-20 NOTE — ED Notes (Signed)
Pt given food drink,

## 2018-03-20 NOTE — ED Notes (Signed)
Dr. Eulis Foster art bedside.Family states that blood glucose monitor indicates 91.

## 2018-03-20 NOTE — ED Triage Notes (Signed)
Pt here from home fell out out of bed this morning , no loc , lac to the forehead , pt is on plavix

## 2018-03-21 ENCOUNTER — Encounter

## 2018-03-21 ENCOUNTER — Encounter (HOSPITAL_COMMUNITY): Payer: Self-pay

## 2018-03-21 ENCOUNTER — Encounter: Payer: Self-pay | Admitting: Neurology

## 2018-03-21 ENCOUNTER — Ambulatory Visit: Payer: Medicare Other | Admitting: Neurology

## 2018-03-21 VITALS — BP 136/72 | HR 67 | Ht 70.0 in | Wt 138.0 lb

## 2018-03-21 DIAGNOSIS — R310 Gross hematuria: Secondary | ICD-10-CM | POA: Diagnosis not present

## 2018-03-21 DIAGNOSIS — G40909 Epilepsy, unspecified, not intractable, without status epilepticus: Secondary | ICD-10-CM | POA: Diagnosis not present

## 2018-03-21 DIAGNOSIS — R269 Unspecified abnormalities of gait and mobility: Secondary | ICD-10-CM | POA: Insufficient documentation

## 2018-03-21 DIAGNOSIS — R350 Frequency of micturition: Secondary | ICD-10-CM | POA: Diagnosis not present

## 2018-03-21 HISTORY — DX: Unspecified abnormalities of gait and mobility: R26.9
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
## 2018-03-23 ENCOUNTER — Emergency Department (HOSPITAL_COMMUNITY): Payer: Medicare Other

## 2018-03-23 ENCOUNTER — Inpatient Hospital Stay (HOSPITAL_COMMUNITY)
Admission: EM | Admit: 2018-03-23 | Discharge: 2018-03-26 | DRG: 871 | Disposition: A | Discharge status: Home Health | Payer: Medicare Other | Attending: Internal Medicine | Admitting: Internal Medicine

## 2018-03-23 ENCOUNTER — Encounter (HOSPITAL_COMMUNITY): Payer: Self-pay

## 2018-03-23 ENCOUNTER — Ambulatory Visit
Admission: EM | Admit: 2018-03-23 | Discharge: 2018-03-23 | Disposition: A | Discharge status: Other Acute Inpatient Hospital | Payer: Medicare Other | Source: Home / Self Care

## 2018-03-23 DIAGNOSIS — Z89421 Acquired absence of other right toe(s): Secondary | ICD-10-CM

## 2018-03-23 DIAGNOSIS — Z794 Long term (current) use of insulin: Secondary | ICD-10-CM | POA: Diagnosis not present

## 2018-03-23 DIAGNOSIS — Z89512 Acquired absence of left leg below knee: Secondary | ICD-10-CM

## 2018-03-23 DIAGNOSIS — G40909 Epilepsy, unspecified, not intractable, without status epilepticus: Secondary | ICD-10-CM | POA: Diagnosis not present

## 2018-03-23 DIAGNOSIS — D72820 Lymphocytosis (symptomatic): Secondary | ICD-10-CM | POA: Diagnosis not present

## 2018-03-23 DIAGNOSIS — G9340 Encephalopathy, unspecified: Secondary | ICD-10-CM | POA: Diagnosis present

## 2018-03-23 DIAGNOSIS — R509 Fever, unspecified: Secondary | ICD-10-CM

## 2018-03-23 DIAGNOSIS — N184 Chronic kidney disease, stage 4 (severe): Secondary | ICD-10-CM | POA: Diagnosis not present

## 2018-03-23 DIAGNOSIS — R51 Headache: Secondary | ICD-10-CM

## 2018-03-23 DIAGNOSIS — I10 Essential (primary) hypertension: Secondary | ICD-10-CM | POA: Diagnosis present

## 2018-03-23 DIAGNOSIS — E1122 Type 2 diabetes mellitus with diabetic chronic kidney disease: Secondary | ICD-10-CM | POA: Diagnosis present

## 2018-03-23 DIAGNOSIS — Z87892 Personal history of anaphylaxis: Secondary | ICD-10-CM | POA: Diagnosis not present

## 2018-03-23 DIAGNOSIS — Z833 Family history of diabetes mellitus: Secondary | ICD-10-CM

## 2018-03-23 DIAGNOSIS — I5022 Chronic systolic (congestive) heart failure: Secondary | ICD-10-CM | POA: Diagnosis present

## 2018-03-23 DIAGNOSIS — R519 Headache, unspecified: Secondary | ICD-10-CM

## 2018-03-23 DIAGNOSIS — K5900 Constipation, unspecified: Secondary | ICD-10-CM | POA: Diagnosis not present

## 2018-03-23 DIAGNOSIS — Z79899 Other long term (current) drug therapy: Secondary | ICD-10-CM | POA: Diagnosis not present

## 2018-03-23 DIAGNOSIS — A419 Sepsis, unspecified organism: Secondary | ICD-10-CM | POA: Diagnosis not present

## 2018-03-23 DIAGNOSIS — E860 Dehydration: Secondary | ICD-10-CM | POA: Diagnosis present

## 2018-03-23 DIAGNOSIS — E872 Acidosis: Secondary | ICD-10-CM | POA: Diagnosis not present

## 2018-03-23 DIAGNOSIS — G9341 Metabolic encephalopathy: Secondary | ICD-10-CM | POA: Diagnosis present

## 2018-03-23 DIAGNOSIS — R1310 Dysphagia, unspecified: Secondary | ICD-10-CM

## 2018-03-23 DIAGNOSIS — R6510 Systemic inflammatory response syndrome (SIRS) of non-infectious origin without acute organ dysfunction: Secondary | ICD-10-CM | POA: Diagnosis present

## 2018-03-23 DIAGNOSIS — I5042 Chronic combined systolic (congestive) and diastolic (congestive) heart failure: Secondary | ICD-10-CM | POA: Diagnosis present

## 2018-03-23 DIAGNOSIS — Z880 Allergy status to penicillin: Secondary | ICD-10-CM

## 2018-03-23 DIAGNOSIS — F0390 Unspecified dementia without behavioral disturbance: Secondary | ICD-10-CM | POA: Diagnosis present

## 2018-03-23 DIAGNOSIS — Z8781 Personal history of (healed) traumatic fracture: Secondary | ICD-10-CM

## 2018-03-23 DIAGNOSIS — Z885 Allergy status to narcotic agent status: Secondary | ICD-10-CM

## 2018-03-23 DIAGNOSIS — R413 Other amnesia: Secondary | ICD-10-CM | POA: Diagnosis present

## 2018-03-23 DIAGNOSIS — Z8701 Personal history of pneumonia (recurrent): Secondary | ICD-10-CM

## 2018-03-23 DIAGNOSIS — S199XXA Unspecified injury of neck, initial encounter: Secondary | ICD-10-CM | POA: Diagnosis not present

## 2018-03-23 DIAGNOSIS — D631 Anemia in chronic kidney disease: Secondary | ICD-10-CM | POA: Diagnosis not present

## 2018-03-23 DIAGNOSIS — I130 Hypertensive heart and chronic kidney disease with heart failure and stage 1 through stage 4 chronic kidney disease, or unspecified chronic kidney disease: Secondary | ICD-10-CM | POA: Diagnosis present

## 2018-03-23 DIAGNOSIS — I69398 Other sequelae of cerebral infarction: Secondary | ICD-10-CM

## 2018-03-23 DIAGNOSIS — R269 Unspecified abnormalities of gait and mobility: Secondary | ICD-10-CM | POA: Diagnosis present

## 2018-03-23 DIAGNOSIS — E1151 Type 2 diabetes mellitus with diabetic peripheral angiopathy without gangrene: Secondary | ICD-10-CM | POA: Diagnosis not present

## 2018-03-23 DIAGNOSIS — E785 Hyperlipidemia, unspecified: Secondary | ICD-10-CM | POA: Diagnosis not present

## 2018-03-23 DIAGNOSIS — I5032 Chronic diastolic (congestive) heart failure: Secondary | ICD-10-CM | POA: Diagnosis present

## 2018-03-23 DIAGNOSIS — N400 Enlarged prostate without lower urinary tract symptoms: Secondary | ICD-10-CM | POA: Diagnosis present

## 2018-03-23 DIAGNOSIS — Z7902 Long term (current) use of antithrombotics/antiplatelets: Secondary | ICD-10-CM

## 2018-03-23 DIAGNOSIS — R42 Dizziness and giddiness: Secondary | ICD-10-CM | POA: Diagnosis not present

## 2018-03-23 DIAGNOSIS — R4182 Altered mental status, unspecified: Secondary | ICD-10-CM

## 2018-03-23 DIAGNOSIS — Z9049 Acquired absence of other specified parts of digestive tract: Secondary | ICD-10-CM

## 2018-03-23 DIAGNOSIS — M1990 Unspecified osteoarthritis, unspecified site: Secondary | ICD-10-CM | POA: Diagnosis not present

## 2018-03-23 DIAGNOSIS — N390 Urinary tract infection, site not specified: Secondary | ICD-10-CM | POA: Diagnosis present

## 2018-03-23 DIAGNOSIS — E119 Type 2 diabetes mellitus without complications: Secondary | ICD-10-CM

## 2018-03-23 LAB — RESPIRATORY PANEL BY PCR
Adenovirus: NOT DETECTED
Bordetella pertussis: NOT DETECTED
Chlamydophila pneumoniae: NOT DETECTED
Coronavirus 229E: NOT DETECTED
Coronavirus HKU1: NOT DETECTED
Coronavirus NL63: NOT DETECTED
Coronavirus OC43: NOT DETECTED
Influenza A: NOT DETECTED
Influenza B: NOT DETECTED
Metapneumovirus: NOT DETECTED
Mycoplasma pneumoniae: NOT DETECTED
Parainfluenza Virus 1: NOT DETECTED
Parainfluenza Virus 2: NOT DETECTED
Parainfluenza Virus 3: NOT DETECTED
Parainfluenza Virus 4: NOT DETECTED
RESPIRATORY SYNCYTIAL VIRUS-RVPPCR: NOT DETECTED
Rhinovirus / Enterovirus: NOT DETECTED

## 2018-03-23 LAB — COMPREHENSIVE METABOLIC PANEL
ALT: 18 U/L (ref 0–44)
ANION GAP: 10 (ref 5–15)
AST: 22 U/L (ref 15–41)
Albumin: 3.5 g/dL (ref 3.5–5.0)
Alkaline Phosphatase: 57 U/L (ref 38–126)
BUN: 50 mg/dL — ABNORMAL HIGH (ref 8–23)
CO2: 26 mmol/L (ref 22–32)
Calcium: 9.2 mg/dL (ref 8.9–10.3)
Chloride: 102 mmol/L (ref 98–111)
Creatinine, Ser: 3.17 mg/dL — ABNORMAL HIGH (ref 0.61–1.24)
GFR calc Af Amer: 22 mL/min — ABNORMAL LOW (ref >60)
GFR calc non Af Amer: 19 mL/min — ABNORMAL LOW (ref >60)
Glucose, Bld: 186 mg/dL — ABNORMAL HIGH (ref 70–99)
Potassium: 4.2 mmol/L (ref 3.5–5.1)
Sodium: 138 mmol/L (ref 135–145)
Total Bilirubin: 0.3 mg/dL (ref 0.3–1.2)
Total Protein: 7.2 g/dL (ref 6.5–8.1)

## 2018-03-23 LAB — URINALYSIS, ROUTINE W REFLEX MICROSCOPIC
Bilirubin Urine: NEGATIVE
Glucose, UA: 50 mg/dL — AB
Hgb urine dipstick: NEGATIVE
Ketones, ur: NEGATIVE mg/dL
Nitrite: NEGATIVE
Protein, ur: 100 mg/dL — AB
Specific Gravity, Urine: 1.012 (ref 1.005–1.030)
WBC, UA: >50 WBC/hpf — ABNORMAL HIGH (ref 0–5)
pH: 5 (ref 5.0–8.0)

## 2018-03-23 LAB — APTT: aPTT: 27 seconds (ref 24–36)

## 2018-03-23 LAB — DIFFERENTIAL
Abs Immature Granulocytes: 0.09 10*3/uL — ABNORMAL HIGH (ref 0.00–0.07)
BASOS PCT: 0 %
Basophils Absolute: 0 10*3/uL (ref 0.0–0.1)
Eosinophils Absolute: 0 10*3/uL (ref 0.0–0.5)
Eosinophils Relative: 0 %
Immature Granulocytes: 1 %
LYMPHS ABS: 0.2 10*3/uL — AB (ref 0.7–4.0)
Lymphocytes Relative: 1 %
Monocytes Absolute: 1.1 10*3/uL — ABNORMAL HIGH (ref 0.1–1.0)
Monocytes Relative: 7 %
Neutro Abs: 14.7 10*3/uL — ABNORMAL HIGH (ref 1.7–7.7)
Neutrophils Relative %: 91 %

## 2018-03-23 LAB — CBC
HCT: 39.7 % (ref 39.0–52.0)
Hemoglobin: 12.4 g/dL — ABNORMAL LOW (ref 13.0–17.0)
MCH: 29.2 pg (ref 26.0–34.0)
MCHC: 31.2 g/dL (ref 30.0–36.0)
MCV: 93.6 fL (ref 80.0–100.0)
NRBC: 0 % (ref 0.0–0.2)
Platelets: 169 10*3/uL (ref 150–400)
RBC: 4.24 MIL/uL (ref 4.22–5.81)
RDW: 13.3 % (ref 11.5–15.5)
WBC: 16.1 10*3/uL — ABNORMAL HIGH (ref 4.0–10.5)

## 2018-03-23 LAB — TSH: TSH: 0.845 u[IU]/mL (ref 0.350–4.500)

## 2018-03-23 LAB — PROTEIN, CSF: Total  Protein, CSF: 57 mg/dL — ABNORMAL HIGH (ref 15–45)

## 2018-03-23 LAB — I-STAT TROPONIN, ED: TROPONIN I, POC: 0.01 ng/mL (ref 0.00–0.08)

## 2018-03-23 LAB — AMMONIA: Ammonia: 13 umol/L (ref 9–35)

## 2018-03-23 LAB — GLUCOSE, CSF: Glucose, CSF: 126 mg/dL — ABNORMAL HIGH (ref 40–70)

## 2018-03-23 LAB — PROTIME-INR
INR: 0.97
Prothrombin Time: 12.8 seconds (ref 11.4–15.2)

## 2018-03-23 LAB — CBG MONITORING, ED: Glucose-Capillary: 193 mg/dL — ABNORMAL HIGH (ref 70–99)

## 2018-03-23 MED ORDER — SULFAMETHOXAZOLE-TRIMETHOPRIM 400-80 MG/5ML IV SOLN
320.0000 mg | Freq: Two times a day (BID) | INTRAVENOUS | Status: DC
  Filled 2018-03-23: qty 20

## 2018-03-23 MED ORDER — VANCOMYCIN HCL IN DEXTROSE 1-5 GM/200ML-% IV SOLN
1000.0000 mg | Freq: Once | INTRAVENOUS | Status: AC
  Administered 2018-03-23: 1000 mg via INTRAVENOUS
  Filled 2018-03-23: qty 200

## 2018-03-23 MED ORDER — SULFAMETHOXAZOLE-TRIMETHOPRIM 400-80 MG/5ML IV SOLN
300.0000 mg | Freq: Once | INTRAVENOUS | Status: AC
  Administered 2018-03-24: 300 mg via INTRAVENOUS
  Filled 2018-03-23: qty 18.75

## 2018-03-23 MED ORDER — LIDOCAINE HCL (PF) 1 % IJ SOLN
10.0000 mL | Freq: Once | INTRAMUSCULAR | Status: AC
  Administered 2018-03-23: 10 mL via INTRADERMAL
  Filled 2018-03-23: qty 10

## 2018-03-23 MED ORDER — ACETAMINOPHEN 500 MG PO TABS
1000.0000 mg | ORAL_TABLET | Freq: Once | ORAL | Status: AC
  Administered 2018-03-23: 1000 mg via ORAL
  Filled 2018-03-23: qty 2

## 2018-03-23 MED ORDER — SODIUM CHLORIDE 0.9 % IV SOLN
1.0000 g | Freq: Two times a day (BID) | INTRAVENOUS | Status: DC
  Filled 2018-03-23: qty 1

## 2018-03-23 MED ORDER — VANCOMYCIN HCL IN DEXTROSE 1-5 GM/200ML-% IV SOLN: 1000.0000 mg | INTRAVENOUS | Status: DC

## 2018-03-23 MED ORDER — SODIUM CHLORIDE 0.9 % IV SOLN
1.0000 g | Freq: Once | INTRAVENOUS | Status: AC
  Administered 2018-03-23: 1 g via INTRAVENOUS
  Filled 2018-03-23: qty 1

## 2018-03-23 NOTE — ED Notes (Signed)
Kelsey-PA notified of change in pts temperature.

## 2018-03-23 NOTE — Progress Notes (Signed)
Pharmacy Antibiotic Note  [name]. is a 73 y.o. male admitted on 03/23/2018 with meningitis.  Pharmacy has been consulted for Septra, Meropenem, Vancomycin dosing.  Plan: Meropenem 1 gram iv Q 12 hours Bactrim 300 mg iv 12 hours Vancomycin 1 gram iv Q 48 hours  Follow up plan, Scr, cultures       Temp (24hrs), Avg:100.2 F (37.9 C), Min:99.4 F (37.4 C), Max:101 F (38.3 C)  Recent Labs  Lab 03/23/18 1607  WBC 16.1*  CREATININE 3.17*    Estimated Creatinine Clearance: 19.2 mL/min (A) (by C-G formula based on SCr of 3.17 mg/dL (H)).    Allergies  Allergen Reactions  . Codeine Anaphylaxis  . Penicillins Anaphylaxis, Swelling, Rash and Other (See Comments)    PATIENT HAD A PCN REACTION WITH IMMEDIATE RASH, FACIAL/TONGUE/THROAT SWELLING, SOB, OR LIGHTHEADEDNESS WITH HYPOTENSION:  #  #  #  YES  #  #  #  Has patient had a PCN reaction causing severe rash involving mucus membranes or skin necrosis: No Has patient had a PCN reaction that required hospitalization: Unknown Has patient had a PCN reaction occurring within the last 10 years: No If all of the above answers are "NO", then may proceed with Cephalosporin use.      Thank you for allowing pharmacy to be a part of this patient's care. Anette Guarneri, PharmD [phone] 03/23/2018 10:12 PM

## 2018-03-23 NOTE — ED Triage Notes (Signed)
Patient here with spouse who report that patient developed increased weakness and dizziness around 11am today. States that she went to [facility] first and was sent to ED for further evaluation. Patient alert and oriented, no obvious neuro deficits, slight possible mouth droop but no other deficits.
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
## 2018-03-24 ENCOUNTER — Encounter (HOSPITAL_COMMUNITY): Payer: Self-pay

## 2018-03-24 ENCOUNTER — Other Ambulatory Visit: Payer: Self-pay

## 2018-03-24 ENCOUNTER — Observation Stay (HOSPITAL_COMMUNITY): Payer: Medicare Other

## 2018-03-24 DIAGNOSIS — N400 Enlarged prostate without lower urinary tract symptoms: Secondary | ICD-10-CM | POA: Diagnosis present

## 2018-03-24 DIAGNOSIS — Z87892 Personal history of anaphylaxis: Secondary | ICD-10-CM | POA: Diagnosis not present

## 2018-03-24 DIAGNOSIS — I130 Hypertensive heart and chronic kidney disease with heart failure and stage 1 through stage 4 chronic kidney disease, or unspecified chronic kidney disease: Secondary | ICD-10-CM | POA: Diagnosis present

## 2018-03-24 DIAGNOSIS — D631 Anemia in chronic kidney disease: Secondary | ICD-10-CM | POA: Diagnosis present

## 2018-03-24 DIAGNOSIS — A419 Sepsis, unspecified organism: Secondary | ICD-10-CM | POA: Diagnosis present

## 2018-03-24 DIAGNOSIS — G40909 Epilepsy, unspecified, not intractable, without status epilepticus: Secondary | ICD-10-CM | POA: Diagnosis present

## 2018-03-24 DIAGNOSIS — K5900 Constipation, unspecified: Secondary | ICD-10-CM

## 2018-03-24 DIAGNOSIS — Z794 Long term (current) use of insulin: Secondary | ICD-10-CM | POA: Diagnosis not present

## 2018-03-24 DIAGNOSIS — Z8701 Personal history of pneumonia (recurrent): Secondary | ICD-10-CM | POA: Diagnosis not present

## 2018-03-24 DIAGNOSIS — F0390 Unspecified dementia without behavioral disturbance: Secondary | ICD-10-CM | POA: Diagnosis present

## 2018-03-24 DIAGNOSIS — I6602 Occlusion and stenosis of left middle cerebral artery: Secondary | ICD-10-CM | POA: Diagnosis not present

## 2018-03-24 DIAGNOSIS — E860 Dehydration: Secondary | ICD-10-CM | POA: Diagnosis present

## 2018-03-24 DIAGNOSIS — R1310 Dysphagia, unspecified: Secondary | ICD-10-CM | POA: Diagnosis present

## 2018-03-24 DIAGNOSIS — G9341 Metabolic encephalopathy: Secondary | ICD-10-CM | POA: Diagnosis present

## 2018-03-24 DIAGNOSIS — I5032 Chronic diastolic (congestive) heart failure: Secondary | ICD-10-CM | POA: Diagnosis not present

## 2018-03-24 DIAGNOSIS — Z7902 Long term (current) use of antithrombotics/antiplatelets: Secondary | ICD-10-CM | POA: Diagnosis not present

## 2018-03-24 DIAGNOSIS — N390 Urinary tract infection, site not specified: Secondary | ICD-10-CM | POA: Diagnosis not present

## 2018-03-24 DIAGNOSIS — D72820 Lymphocytosis (symptomatic): Secondary | ICD-10-CM | POA: Diagnosis present

## 2018-03-24 DIAGNOSIS — E1151 Type 2 diabetes mellitus with diabetic peripheral angiopathy without gangrene: Secondary | ICD-10-CM | POA: Diagnosis present

## 2018-03-24 DIAGNOSIS — Z79899 Other long term (current) drug therapy: Secondary | ICD-10-CM | POA: Diagnosis not present

## 2018-03-24 DIAGNOSIS — G9340 Encephalopathy, unspecified: Secondary | ICD-10-CM | POA: Diagnosis not present

## 2018-03-24 DIAGNOSIS — E872 Acidosis: Secondary | ICD-10-CM | POA: Diagnosis present

## 2018-03-24 DIAGNOSIS — I69398 Other sequelae of cerebral infarction: Secondary | ICD-10-CM | POA: Diagnosis not present

## 2018-03-24 DIAGNOSIS — E1122 Type 2 diabetes mellitus with diabetic chronic kidney disease: Secondary | ICD-10-CM | POA: Diagnosis not present

## 2018-03-24 DIAGNOSIS — M1990 Unspecified osteoarthritis, unspecified site: Secondary | ICD-10-CM | POA: Diagnosis present

## 2018-03-24 DIAGNOSIS — E785 Hyperlipidemia, unspecified: Secondary | ICD-10-CM | POA: Diagnosis present

## 2018-03-24 DIAGNOSIS — R269 Unspecified abnormalities of gait and mobility: Secondary | ICD-10-CM | POA: Diagnosis present

## 2018-03-24 DIAGNOSIS — I5042 Chronic combined systolic (congestive) and diastolic (congestive) heart failure: Secondary | ICD-10-CM | POA: Diagnosis present

## 2018-03-24 DIAGNOSIS — N184 Chronic kidney disease, stage 4 (severe): Secondary | ICD-10-CM | POA: Diagnosis not present

## 2018-03-24 LAB — CSF CELL COUNT WITH DIFFERENTIAL
RBC Count, CSF: 197 /mm3 — ABNORMAL HIGH
RBC Count, CSF: 8700 /mm3 — ABNORMAL HIGH
TUBE #: 4
Tube #: 1
WBC, CSF: 0 /mm3 (ref 0–5)
WBC, CSF: 3 /mm3 (ref 0–5)

## 2018-03-24 LAB — LACTIC ACID, PLASMA
LACTIC ACID, VENOUS: 1.4 mmol/L (ref 0.5–1.9)
LACTIC ACID, VENOUS: 1.7 mmol/L (ref 0.5–1.9)

## 2018-03-24 LAB — CBC
HCT: 30.3 % — ABNORMAL LOW (ref 39.0–52.0)
Hemoglobin: 9.4 g/dL — ABNORMAL LOW (ref 13.0–17.0)
MCH: 29.6 pg (ref 26.0–34.0)
MCHC: 31 g/dL (ref 30.0–36.0)
MCV: 95.3 fL (ref 80.0–100.0)
Platelets: 145 10*3/uL — ABNORMAL LOW (ref 150–400)
RBC: 3.18 MIL/uL — ABNORMAL LOW (ref 4.22–5.81)
RDW: 13.3 % (ref 11.5–15.5)
WBC: 12.9 10*3/uL — AB (ref 4.0–10.5)
nRBC: 0 % (ref 0.0–0.2)

## 2018-03-24 LAB — CBG MONITORING, ED: Glucose-Capillary: 189 mg/dL — ABNORMAL HIGH (ref 70–99)

## 2018-03-24 LAB — COMPREHENSIVE METABOLIC PANEL
ALT: 14 U/L (ref 0–44)
AST: 16 U/L (ref 15–41)
Albumin: 2.4 g/dL — ABNORMAL LOW (ref 3.5–5.0)
Alkaline Phosphatase: 42 U/L (ref 38–126)
Anion gap: 9 (ref 5–15)
BUN: 48 mg/dL — ABNORMAL HIGH (ref 8–23)
CO2: 26 mmol/L (ref 22–32)
Calcium: 8 mg/dL — ABNORMAL LOW (ref 8.9–10.3)
Chloride: 103 mmol/L (ref 98–111)
Creatinine, Ser: 3.02 mg/dL — ABNORMAL HIGH (ref 0.61–1.24)
GFR calc Af Amer: 23 mL/min — ABNORMAL LOW (ref >60)
GFR calc non Af Amer: 20 mL/min — ABNORMAL LOW (ref >60)
Glucose, Bld: 212 mg/dL — ABNORMAL HIGH (ref 70–99)
Potassium: 3.7 mmol/L (ref 3.5–5.1)
Sodium: 138 mmol/L (ref 135–145)
Total Bilirubin: 0.7 mg/dL (ref 0.3–1.2)
Total Protein: 5.2 g/dL — ABNORMAL LOW (ref 6.5–8.1)

## 2018-03-24 LAB — TROPONIN I
Troponin I: <0.03 ng/mL (ref <0.03)
Troponin I: <0.03 ng/mL (ref <0.03)

## 2018-03-24 LAB — GLUCOSE, CAPILLARY
GLUCOSE-CAPILLARY: 182 mg/dL — AB (ref 70–99)
Glucose-Capillary: 111 mg/dL — ABNORMAL HIGH (ref 70–99)

## 2018-03-24 LAB — PHOSPHORUS: Phosphorus: 3.9 mg/dL (ref 2.5–4.6)

## 2018-03-24 LAB — MAGNESIUM: Magnesium: 1.8 mg/dL (ref 1.7–2.4)

## 2018-03-24 LAB — TSH: TSH: 0.935 u[IU]/mL (ref 0.350–4.500)

## 2018-03-24 MED ORDER — INSULIN ASPART 100 UNIT/ML ~~LOC~~ SOLN
0.0000 [IU] | Freq: Every day | SUBCUTANEOUS | Status: DC
  Administered 2018-03-25: 5 [IU] via SUBCUTANEOUS

## 2018-03-24 MED ORDER — ATORVASTATIN CALCIUM 10 MG PO TABS
20.0000 mg | ORAL_TABLET | Freq: Every evening | ORAL | Status: DC
  Administered 2018-03-24 – 2018-03-25 (×2): 20 mg via ORAL
  Filled 2018-03-24 (×2): qty 2

## 2018-03-24 MED ORDER — ISOSORB DINITRATE-HYDRALAZINE 20-37.5 MG PO TABS
1.0000 | ORAL_TABLET | Freq: Three times a day (TID) | ORAL | Status: DC
  Administered 2018-03-24 – 2018-03-26 (×5): 1 via ORAL
  Filled 2018-03-24 (×5): qty 1

## 2018-03-24 MED ORDER — POLYETHYLENE GLYCOL 3350 17 G PO PACK: 17.0000 g | PACK | Freq: Every day | ORAL | Status: DC | PRN

## 2018-03-24 MED ORDER — DOCUSATE SODIUM 100 MG PO CAPS
100.0000 mg | ORAL_CAPSULE | Freq: Two times a day (BID) | ORAL | Status: DC
  Administered 2018-03-24 – 2018-03-26 (×5): 100 mg via ORAL
  Filled 2018-03-24 (×4): qty 1

## 2018-03-24 MED ORDER — CLOPIDOGREL BISULFATE 75 MG PO TABS
75.0000 mg | ORAL_TABLET | Freq: Every day | ORAL | Status: DC
  Administered 2018-03-24 – 2018-03-26 (×3): 75 mg via ORAL
  Filled 2018-03-24 (×4): qty 1

## 2018-03-24 MED ORDER — ONDANSETRON HCL 4 MG/2ML IJ SOLN: 4.0000 mg | Freq: Four times a day (QID) | INTRAMUSCULAR | Status: DC | PRN

## 2018-03-24 MED ORDER — TIMOLOL MALEATE 0.5 % OP SOLN
1.0000 [drp] | Freq: Two times a day (BID) | OPHTHALMIC | Status: DC
  Administered 2018-03-24 – 2018-03-26 (×5): 1 [drp] via OPHTHALMIC
  Filled 2018-03-24: qty 5

## 2018-03-24 MED ORDER — ONDANSETRON HCL 4 MG PO TABS: 4.0000 mg | ORAL_TABLET | Freq: Four times a day (QID) | ORAL | Status: DC | PRN

## 2018-03-24 MED ORDER — MEMANTINE HCL 10 MG PO TABS
10.0000 mg | ORAL_TABLET | Freq: Two times a day (BID) | ORAL | Status: DC
  Administered 2018-03-25 – 2018-03-26 (×3): 10 mg via ORAL
  Filled 2018-03-24 (×3): qty 1

## 2018-03-24 MED ORDER — LATANOPROST 0.005 % OP SOLN
1.0000 [drp] | Freq: Every day | OPHTHALMIC | Status: DC
  Administered 2018-03-24 – 2018-03-25 (×2): 1 [drp] via OPHTHALMIC
  Filled 2018-03-24: qty 2.5

## 2018-03-24 MED ORDER — SODIUM CHLORIDE 0.9 % IV SOLN
INTRAVENOUS | Status: AC
  Administered 2018-03-24: 16:00:00 via INTRAVENOUS

## 2018-03-24 MED ORDER — CARVEDILOL 6.25 MG PO TABS
6.2500 mg | ORAL_TABLET | Freq: Two times a day (BID) | ORAL | Status: DC
  Administered 2018-03-24 – 2018-03-26 (×4): 6.25 mg via ORAL
  Filled 2018-03-24 (×4): qty 1

## 2018-03-24 MED ORDER — FINASTERIDE 5 MG PO TABS
5.0000 mg | ORAL_TABLET | Freq: Every day | ORAL | Status: DC
  Administered 2018-03-24 – 2018-03-26 (×3): 5 mg via ORAL
  Filled 2018-03-24 (×4): qty 1

## 2018-03-24 MED ORDER — SENNA 8.6 MG PO TABS
1.0000 | ORAL_TABLET | Freq: Every day | ORAL | Status: DC
  Administered 2018-03-24 – 2018-03-26 (×3): 8.6 mg via ORAL
  Filled 2018-03-24 (×3): qty 1

## 2018-03-24 MED ORDER — GABAPENTIN 300 MG PO CAPS
300.0000 mg | ORAL_CAPSULE | Freq: Every day | ORAL | Status: DC
  Administered 2018-03-24 – 2018-03-25 (×2): 300 mg via ORAL
  Filled 2018-03-24 (×2): qty 1

## 2018-03-24 MED ORDER — SODIUM CHLORIDE 0.9 % IV SOLN
2.0000 g | INTRAVENOUS | Status: DC
  Administered 2018-03-24 – 2018-03-25 (×2): 2 g via INTRAVENOUS
  Filled 2018-03-24 (×2): qty 20

## 2018-03-24 MED ORDER — SODIUM CHLORIDE 0.9 % IV SOLN
INTRAVENOUS | Status: DC
  Administered 2018-03-24: 05:00:00 via INTRAVENOUS

## 2018-03-24 MED ORDER — INSULIN ASPART 100 UNIT/ML ~~LOC~~ SOLN
0.0000 [IU] | Freq: Three times a day (TID) | SUBCUTANEOUS | Status: DC
  Administered 2018-03-24: 2 [IU] via SUBCUTANEOUS
  Administered 2018-03-25: 9 [IU] via SUBCUTANEOUS
  Administered 2018-03-25 (×2): 1 [IU] via SUBCUTANEOUS
  Administered 2018-03-26: 2 [IU] via SUBCUTANEOUS

## 2018-03-24 MED ORDER — TAMSULOSIN HCL 0.4 MG PO CAPS
0.4000 mg | ORAL_CAPSULE | Freq: Every day | ORAL | Status: DC
  Administered 2018-03-24 – 2018-03-25 (×2): 0.4 mg via ORAL
  Filled 2018-03-24 (×2): qty 1

## 2018-03-24 MED ORDER — SODIUM BICARBONATE 650 MG PO TABS
650.0000 mg | ORAL_TABLET | Freq: Three times a day (TID) | ORAL | Status: DC
  Administered 2018-03-24 – 2018-03-26 (×7): 650 mg via ORAL
  Filled 2018-03-24 (×8): qty 1

## 2018-03-24 MED ORDER — INSULIN DETEMIR 100 UNIT/ML ~~LOC~~ SOLN
4.0000 [IU] | Freq: Every day | SUBCUTANEOUS | Status: DC
  Administered 2018-03-24 – 2018-03-26 (×2): 4 [IU] via SUBCUTANEOUS
  Filled 2018-03-24 (×3): qty 0.04

## 2018-03-24 MED ORDER — BISACODYL 10 MG RE SUPP: 10.0000 mg | Freq: Every day | RECTAL | Status: DC | PRN

## 2018-03-24 MED ORDER — ACETAMINOPHEN 650 MG RE SUPP: 650.0000 mg | Freq: Four times a day (QID) | RECTAL | Status: DC | PRN

## 2018-03-24 MED ORDER — VENLAFAXINE HCL ER 75 MG PO CP24
75.0000 mg | ORAL_CAPSULE | Freq: Two times a day (BID) | ORAL | Status: DC
  Administered 2018-03-25 – 2018-03-26 (×3): 75 mg via ORAL
  Filled 2018-03-24 (×4): qty 1

## 2018-03-24 MED ORDER — LAMOTRIGINE 100 MG PO TABS
100.0000 mg | ORAL_TABLET | Freq: Two times a day (BID) | ORAL | Status: DC
  Administered 2018-03-24 – 2018-03-26 (×5): 100 mg via ORAL
  Filled 2018-03-24 (×6): qty 1

## 2018-03-24 MED ORDER — ACETAMINOPHEN 325 MG PO TABS: 650.0000 mg | ORAL_TABLET | Freq: Four times a day (QID) | ORAL | Status: DC | PRN

## 2018-03-24 NOTE — ED Notes (Signed)
Attempted to call report, RN to call back (in pt room)

## 2018-03-24 NOTE — Evaluation (Signed)
Physical Therapy Evaluation Patient Details Name: [name]. MRN: [id number] DOB: Mar 08, 1946 Today's Date: 03/24/2018   History of Present Illness  Pt is a 72 y.o. M with significant PMH of seizure disorder, right BKA, CKD, DM2, HTN, dementia who was admitted with acute encephalopathy  Clinical Impression  Patient presenting close to his functional baseline per pt wife. Prior to admission, pt lives with his wife who provides assistance for mobility with pt using walker and ADL's. On PT evaluation, patient ambulating 150 feet with prosthetic donned with mostly min guard assistance but displayed 2 episodes of loss of balance requiring maximal assistance to correct. Pt presents as high fall risk based on balance deficits, decreased safety awareness, and history of falling. Recommend HHPT at discharge to maximize functional mobility and decrease caregiver burden. Will follow acutely.     Follow Up Recommendations Home health PT;Supervision for mobility/OOB    Equipment Recommendations  None recommended by PT    Recommendations for Other Services OT consult     Precautions / Restrictions Precautions Precautions: Fall Precaution Comments: high fall risk, right BKA with prosthetic Restrictions Weight Bearing Restrictions: No      Mobility  Bed Mobility Overal bed mobility: Needs Assistance Bed Mobility: Supine to Sit     Supine to sit: Supervision     General bed mobility comments: increased time to get to edge of bed   Transfers Overall transfer level: Needs assistance Equipment used: Rolling walker (2 wheeled) Transfers: Sit to/from Stand Sit to Stand: Min assist         General transfer comment: MinA with max multimodal cues for scooting hips forward to edge of bed and hand placement  Ambulation/Gait Ambulation/Gait assistance: Min guard;Max assist   Assistive device: Rolling walker (2 wheeled) Gait Pattern/deviations: Step-through pattern;Decreased stride  length;Trunk flexed     General Gait Details: Pt requires max multimodal cues for posture, walker positioning, feet positioning, and utilizing a wider BOS. 2 episodes of LOB with turning requiring maxA to correct. Pt tends to have trouble controlling forward momentum and recognizing placement of right prosthetic in space  Stairs            Wheelchair Mobility    Modified Rankin (Stroke Patients Only)       Balance Overall balance assessment: Needs assistance Sitting-balance support: Feet supported Sitting balance-Leahy Scale: Good     Standing balance support: Bilateral upper extremity supported Standing balance-Leahy Scale: Poor                               Pertinent Vitals/Pain      Home Living Family/patient expects to be discharged to:: Private residence Living Arrangements: Spouse/significant other Available Help at Discharge: Available 24 hours/day;Family Type of Home: House Home Access: Ramped entrance     Home Layout: One level Home Equipment: Environmental consultant - 2 wheels;Cane - single point;Wheelchair - manual;Bedside commode      Prior Function Level of Independence: Needs assistance   Gait / Transfers Assistance Needed: uses RW with prosthesis. pt wife assists with transfers and ambulation  ADL's / Homemaking Assistance Needed: pt uses BSC and sponge bathes. Pt can bathe and toliet self with set up        Hand Dominance   Dominant Hand: Right    Extremity/Trunk Assessment        Lower Extremity Assessment RLE Deficits / Details: BKA       Communication   Communication:  No difficulties  Cognition Arousal/Alertness: Awake/alert Behavior During Therapy: WFL for tasks assessed/performed Overall Cognitive Status: History of cognitive impairments - at baseline                                 General Comments: history of dementia. follows commands with increased time      General Comments      Exercises      Assessment/Plan    PT Assessment Patient needs continued PT services  PT Problem List Decreased activity tolerance;Decreased balance;Decreased mobility;Decreased safety awareness       PT Treatment Interventions DME instruction;Gait training;Functional mobility training;Therapeutic activities;Therapeutic exercise;Balance training;Patient/family education    PT Goals (Current goals can be found in the Care Plan section)  Acute Rehab PT Goals Patient Stated Goal: none stated; pt wife would like HH services PT Goal Formulation: With patient/family Time For Goal Achievement: 04/07/18 Potential to Achieve Goals: Good    Frequency Min 3X/week   Barriers to discharge        Co-evaluation               AM-PAC PT "6 Clicks" Mobility  Outcome Measure Help needed turning from your back to your side while in a flat bed without using bedrails?: None Help needed moving from lying on your back to sitting on the side of a flat bed without using bedrails?: A Little Help needed moving to and from a bed to a chair (including a wheelchair)?: A Little Help needed standing up from a chair using your arms (e.g., wheelchair or bedside chair)?: A Little Help needed to walk in hospital room?: A Lot Help needed climbing 3-5 steps with a railing? : A Lot 6 Click Score: 17    End of Session Equipment Utilized During Treatment: Gait belt Activity Tolerance: Patient tolerated treatment well Patient left: in bed;with call bell/phone within reach;with family/visitor present Nurse Communication: Mobility status PT Visit Diagnosis: Unsteadiness on feet (R26.81);History of falling (Z91.81)    Time: 8502-7741 PT Time Calculation (min) (ACUTE ONLY): 26 min   Charges:   PT Evaluation $PT Eval Moderate Complexity: 1 Mod PT Treatments $Gait Training: 8-22 mins       Ellamae Sia, PT, DPT Acute Rehabilitation Services Pager [phone] Office [phone]   Willy Eddy 03/24/2018,  5:05 PM

## 2018-03-24 NOTE — Progress Notes (Signed)
Patient arrived to unit in NAD. VS stable, patient free from pain. Wife and daughter at bedside

## 2018-03-24 NOTE — Progress Notes (Signed)
Progress Note    [name].  OLM:[id number] DOB: [birth year]  DOA: 03/23/2018 PCP: Deland Pretty, MD    Brief Narrative:     Medical records reviewed and are as summarized below:  [name]. is an 73 y.o. male with medical history significant of Seizure disorder, CKD, DM 2, HTN, dementia  who was brought to the hospital for acute encephalopathy  Assessment/Plan:   Active Problems:   DM2 (diabetes mellitus, type 2) (HCC)   BPH (benign prostatic hyperplasia)   Essential hypertension   Encephalopathy acute   CKD (chronic kidney disease) stage 4, GFR 15-29 ml/min (HCC)   Chronic diastolic heart failure ([place])   Memory disorder   Seizure disorder ([place])   Acute encephalopathy   Dysphagia   Chronic systolic CHF (congestive heart failure) (HCC)   SIRS (systemic inflammatory response syndrome) ([place])   Acute encephalopathy with fever-    - most likely multifactorial secondary to combination of  infection/dehydration  - Will rehydrate   - treat underlining infection?-- doubt meningitis/no sign of PNA/urine could be source/NP swab negative-- does see Dr. [name] for a chronic lower ext wound-- will need to r/o this as the source -LP was done initially bloody tap some white blood cell present but overwhelming RBCs suspect meningitis being less likely -de-escalate abx and monitor closely  H/o dementia with gait disturbance -not sure of patient's baseline -will need to discuss with family  Essential hypertension  -resume medications as able  CKD (chronic kidney disease) stage 4, GFR 15-29 ml/min (HCC)  -avoid nephrotoxic medications   Anemia -suspect multifactorial -CD and dilutional -trend   Chronic diastolic heart failure (HCC) -appears dry side  -hold Lasix  -gentle IVF   Hx of CVA  -continue Plavix  -MTI w/o acute CVA  Dm 2-   -ordered SSI -until eating hold any other meds  History of seizure disorder - continue Lamictal   With chart review had  recent CT scan of abd/pelvis for hematuria on 03/16/18 and he was severely constipated at that time as well as there was ? Slowed gastric emptying   Family Communication/Anticipated D/C date and plan/Code Status   DVT prophylaxis: scd Code Status: Full Code.  Family Communication: none available Disposition Plan: needs further work up   Medical Consultants:    None.     Subjective:   He is not sure why he is here  Objective:    Vitals:   03/24/18 0915 03/24/18 0930 03/24/18 0930 03/24/18 0945  BP: 131/64 138/88  (!) 152/69  Pulse: 71 71  64  Resp: 14 16  17   Temp:   98.1 F (36.7 C)   TempSrc:   Oral   SpO2: 100% 100%  100%   No intake or output data in the 24 hours ending 03/24/18 1242 There were no vitals filed for this visit.  Exam: Lips very dry with dry mouth as well rrr No wheezing, no increased work of breathing +BS, soft Not oriented to place or time-- only oriented to person  Data Reviewed:   I have personally reviewed following labs and imaging studies:  Labs: Labs show the following:   Basic Metabolic Panel: Recent Labs  Lab 03/23/18 1607 03/24/18 0619  NA 138 138  K 4.2 3.7  CL 102 103  CO2 26 26  GLUCOSE 186* 212*  BUN 50* 48*  CREATININE 3.17* 3.02*  CALCIUM 9.2 8.0*  MG  --  1.8  PHOS  --  3.9   GFR Estimated Creatinine Clearance: 20.2 mL/min (A) (by C-G formula based on SCr of 3.02 mg/dL (H)). Liver Function Tests: Recent Labs  Lab 03/23/18 1607 03/24/18 0619  AST 22 16  ALT 18 14  ALKPHOS 57 42  BILITOT 0.3 0.7  PROT 7.2 5.2*  ALBUMIN 3.5 2.4*   No results for input(s): LIPASE, AMYLASE in the last 168 hours. Recent Labs  Lab 03/23/18 1954  AMMONIA 13   Coagulation profile Recent Labs  Lab 03/23/18 1607  INR 0.97    CBC: Recent Labs  Lab 03/23/18 1607 03/24/18 0619  WBC 16.1* 12.9*  NEUTROABS 14.7*  --   HGB 12.4* 9.4*  HCT 39.7 30.3*  MCV 93.6 95.3  PLT 169 145*   Cardiac Enzymes: Recent Labs   Lab 03/24/18 0005 03/24/18 0619  TROPONINI <0.03 <0.03   BNP (last 3 results) No results for input(s): PROBNP in the last 8760 hours. CBG: Recent Labs  Lab 03/23/18 2341  GLUCAP 193*   D-Dimer: No results for input(s): DDIMER in the last 72 hours. Hgb A1c: No results for input(s): HGBA1C in the last 72 hours. Lipid Profile: No results for input(s): CHOL, HDL, LDLCALC, TRIG, CHOLHDL, LDLDIRECT in the last 72 hours. Thyroid function studies: Recent Labs    03/24/18 0619  TSH 0.935   Anemia work up: No results for input(s): VITAMINB12, FOLATE, FERRITIN, TIBC, IRON, RETICCTPCT in the last 72 hours. Sepsis Labs: Recent Labs  Lab 03/23/18 1607 03/23/18 2340 03/24/18 0619  WBC 16.1*  --  12.9*  LATICACIDVEN  --  1.4 1.7    Microbiology Recent Results (from the past 240 hour(s))  Blood culture (routine x 2)     Status: None (Preliminary result)   Collection Time: 03/23/18  7:32 PM  Result Value Ref Range Status   Specimen Description BLOOD RIGHT ANTECUBITAL  Final   Special Requests   Final    BOTTLES DRAWN AEROBIC AND ANAEROBIC Blood Culture results may not be optimal due to an inadequate volume of blood received in culture bottles   Culture NO GROWTH < 12 HOURS  Final   Report Status PENDING  Incomplete  Respiratory Panel by PCR     Status: None   Collection Time: 03/23/18  7:50 PM  Result Value Ref Range Status   Adenovirus NOT DETECTED NOT DETECTED Final   Coronavirus 229E NOT DETECTED NOT DETECTED Final   Coronavirus HKU1 NOT DETECTED NOT DETECTED Final   Coronavirus NL63 NOT DETECTED NOT DETECTED Final   Coronavirus OC43 NOT DETECTED NOT DETECTED Final   Metapneumovirus NOT DETECTED NOT DETECTED Final   Rhinovirus / Enterovirus NOT DETECTED NOT DETECTED Final   Influenza A NOT DETECTED NOT DETECTED Final   Influenza B NOT DETECTED NOT DETECTED Final   Parainfluenza Virus 1 NOT DETECTED NOT DETECTED Final   Parainfluenza Virus 2 NOT DETECTED NOT DETECTED  Final   Parainfluenza Virus 3 NOT DETECTED NOT DETECTED Final   Parainfluenza Virus 4 NOT DETECTED NOT DETECTED Final   Respiratory Syncytial Virus NOT DETECTED NOT DETECTED Final   Bordetella pertussis NOT DETECTED NOT DETECTED Final   Chlamydophila pneumoniae NOT DETECTED NOT DETECTED Final   Mycoplasma pneumoniae NOT DETECTED NOT DETECTED Final  CSF culture     Status: None (Preliminary result)   Collection Time: 03/23/18  9:46 PM  Result Value Ref Range Status   Specimen Description BACK  Final   Special Requests NONE  Final   Gram Stain   Final  CYTOSPIN SMEAR WBC PRESENT, PREDOMINANTLY MONONUCLEAR NO ORGANISMS SEEN Performed at [facility], [place] [address], [place], [place] [address]    Culture NO GROWTH < 12 HOURS  Final   Report Status PENDING  Incomplete  Blood culture (routine x 2)     Status: None (Preliminary result)   Collection Time: 03/23/18 11:39 PM  Result Value Ref Range Status   Specimen Description BLOOD LEFT FOREARM  Final   Special Requests   Final    BOTTLES DRAWN AEROBIC AND ANAEROBIC Blood Culture results may not be optimal due to an inadequate volume of blood received in culture bottles   Culture NO GROWTH < 12 HOURS  Final   Report Status PENDING  Incomplete    Procedures and diagnostic studies:  Ct Head Wo Contrast  Result Date: 03/23/2018 CLINICAL DATA:  Onset dizziness and weakness of the 11 a.m. today. EXAM: CT HEAD WITHOUT CONTRAST TECHNIQUE: Contiguous axial images were obtained from the base of the skull through the vertex without intravenous contrast. COMPARISON:  Head CT scan 03/20/2018 and 02/19/2018. FINDINGS: Brain: No evidence of acute infarction, hemorrhage, hydrocephalus, extra-axial collection or mass lesion/mass effect. Atrophy, extensive chronic microvascular ischemic change and remote left PCA territory infarct are all unchanged. Vascular: Extensive atherosclerosis noted. Skull: Intact.  No focal lesion. Sinuses/Orbits: Status  post cataract surgery.  Otherwise negative. Other: None. IMPRESSION: No acute abnormality. Atrophy, extensive chronic microvascular ischemic change and remote left PCA infarct. Atherosclerosis. Electronically Signed   By: Inge Rise M.D.   On: 03/23/2018 16:48   Ct Cervical Spine Wo Contrast  Result Date: 03/23/2018 CLINICAL DATA:  C-spine trauma, high clinical risk (NEXUS/CCR). Right-sided weakness. EXAM: CT CERVICAL SPINE WITHOUT CONTRAST TECHNIQUE: Multidetector CT imaging of the cervical spine was performed without intravenous contrast. Multiplanar CT image reconstructions were also generated. COMPARISON:  Cervical spine CT 02/01/2018 FINDINGS: Alignment: Normal. Skull base and vertebrae: No acute fracture. Vertebral body heights are maintained. The dens and skull base are intact. Heterogeneous marrow suggesting osteopenia. Soft tissues and spinal canal: No prevertebral fluid or swelling. No visible canal hematoma. Disc levels: Disc space narrowing and endplate spurring, most prominent at C6-C7. Multilevel facet arthropathy. Multilevel neural foraminal stenosis without canal compromise. Upper chest: Chronic right tracheal diverticulum. Biapical pleuroparenchymal scarring. Advanced carotid calcifications. Other: None. IMPRESSION: Degenerative change in the cervical spine without acute fracture or subluxation. Electronically Signed   By: Keith Rake M.D.   On: 03/23/2018 20:34   Mr Jodene Nam Head Wo Contrast  Result Date: 03/24/2018 CLINICAL DATA:  Initial evaluation for increased weakness and dizziness. Evaluate for stroke. EXAM: MRI HEAD WITHOUT CONTRAST MRA HEAD WITHOUT CONTRAST TECHNIQUE: Multiplanar, multiecho pulse sequences of the brain and surrounding structures were obtained without intravenous contrast. Angiographic images of the head were obtained using MRA technique without contrast. COMPARISON:  Prior CT from 03/23/2017 as well as previous MRI from 10/29/2016. FINDINGS: MRI HEAD FINDINGS  Brain: Diffuse prominence of the CSF containing spaces compatible with generalized age-related cerebral atrophy. Extensive confluent and patchy T2/FLAIR hyperintensity within the periventricular deep white matter both cerebral hemispheres most consistent with chronic microvascular ischemic disease, advanced in nature. Superimposed remote lacunar infarcts present within the right basal ganglia and left thalamus. Small remote left cerebellar infarct. Large remote left PCA territory infarct with associated encephalomalacia and gliosis. No abnormal foci of restricted diffusion to suggest acute or subacute ischemia. Gray-white matter differentiation maintained. No evidence for acute intracranial hemorrhage. No mass lesion, midline shift or mass effect. Diffuse  ventricular prominence related to global parenchymal volume loss of hydrocephalus. No extra-axial fluid collection. Pituitary gland within normal limits. Vascular: Major intracranial vascular flow voids maintained at the skull base. Skull and upper cervical spine: Craniocervical junction within normal limits. Upper cervical spine normal. Bone marrow signal intensity within normal limits. No scalp soft tissue abnormality. Sinuses/Orbits: Patient status post bilateral ocular lens replacement. Scattered mucosal thickening throughout the paranasal sinuses, chronic in appearance. No significant mastoid effusion. Inner ear structures grossly normal. Other: None. MRA HEAD FINDINGS ANTERIOR CIRCULATION: Distal cervical segments of the internal carotid arteries are patent with symmetric antegrade flow. Petrous segments patent bilaterally. 9 mm focal outpouching arising from the petrous left ICA suspicious for aneurysm, stable from previous. Extensive atheromatous irregularity throughout the cavernous/supraclinoid segments bilaterally. There is severe stenosis at the supraclinoid right ICA (series 9, image 88), similar to previous. Left A1 patent. Hypoplastic right A1.  Grossly normal anterior communicating artery. Atheromatous irregularity throughout the anterior cerebral arteries without high-grade stenosis. Focal moderate stenoses involving the mid M1 segments bilaterally, left greater than right. Normal MCA bifurcations. Distal MCA branches perfused and fairly symmetric although demonstrate diffuse small vessel atheromatous irregularity. POSTERIOR CIRCULATION: Both vertebral arteries patent to the vertebrobasilar junction without high-grade stenosis. Right vertebral artery dominant. Posterior inferior cerebral arteries patent proximally. Short fenestration noted within the proximal basilar artery. Scattered atheromatous irregularity within the mid-distal basilar without high-grade stenosis. Superior cerebral arteries patent bilaterally. Right PCA predominantly supplied via the basilar. Predominant fetal type origin left PCA. Moderate to severe bilateral P2 stenoses, left worse than right. Additional extensive atheromatous irregularity throughout the PCAs bilaterally. IMPRESSION: MRI HEAD IMPRESSION: 1. No acute intracranial abnormality. 2. Large chronic left PCA territory infarct. 3. Age-related cerebral atrophy with underlying advanced chronic microvascular ischemic disease MRA HEAD IMPRESSION: 1. Severe intracranial atherosclerotic disease, similar to previous. Notable findings include severe supraclinoid right ICA stenosis, mild-to-moderate M1 stenoses, with severe bilateral P2 stenoses. Extensive distal small vessel atheromatous irregularity throughout the intracranial circulation. 2. 9 mm focal outpouching at the petrous left ICA, suspicious for fusiform aneurysm, similar to previous. Electronically Signed   By: Jeannine Boga M.D.   On: 03/24/2018 06:20   Mr Brain Wo Contrast  Result Date: 03/24/2018 CLINICAL DATA:  Initial evaluation for increased weakness and dizziness. Evaluate for stroke. EXAM: MRI HEAD WITHOUT CONTRAST MRA HEAD WITHOUT CONTRAST TECHNIQUE:  Multiplanar, multiecho pulse sequences of the brain and surrounding structures were obtained without intravenous contrast. Angiographic images of the head were obtained using MRA technique without contrast. COMPARISON:  Prior CT from 03/23/2017 as well as previous MRI from 10/29/2016. FINDINGS: MRI HEAD FINDINGS Brain: Diffuse prominence of the CSF containing spaces compatible with generalized age-related cerebral atrophy. Extensive confluent and patchy T2/FLAIR hyperintensity within the periventricular deep white matter both cerebral hemispheres most consistent with chronic microvascular ischemic disease, advanced in nature. Superimposed remote lacunar infarcts present within the right basal ganglia and left thalamus. Small remote left cerebellar infarct. Large remote left PCA territory infarct with associated encephalomalacia and gliosis. No abnormal foci of restricted diffusion to suggest acute or subacute ischemia. Gray-white matter differentiation maintained. No evidence for acute intracranial hemorrhage. No mass lesion, midline shift or mass effect. Diffuse ventricular prominence related to global parenchymal volume loss of hydrocephalus. No extra-axial fluid collection. Pituitary gland within normal limits. Vascular: Major intracranial vascular flow voids maintained at the skull base. Skull and upper cervical spine: Craniocervical junction within normal limits. Upper cervical spine normal. Bone marrow signal intensity within normal limits.  No scalp soft tissue abnormality. Sinuses/Orbits: Patient status post bilateral ocular lens replacement. Scattered mucosal thickening throughout the paranasal sinuses, chronic in appearance. No significant mastoid effusion. Inner ear structures grossly normal. Other: None. MRA HEAD FINDINGS ANTERIOR CIRCULATION: Distal cervical segments of the internal carotid arteries are patent with symmetric antegrade flow. Petrous segments patent bilaterally. 9 mm focal outpouching  arising from the petrous left ICA suspicious for aneurysm, stable from previous. Extensive atheromatous irregularity throughout the cavernous/supraclinoid segments bilaterally. There is severe stenosis at the supraclinoid right ICA (series 9, image 88), similar to previous. Left A1 patent. Hypoplastic right A1. Grossly normal anterior communicating artery. Atheromatous irregularity throughout the anterior cerebral arteries without high-grade stenosis. Focal moderate stenoses involving the mid M1 segments bilaterally, left greater than right. Normal MCA bifurcations. Distal MCA branches perfused and fairly symmetric although demonstrate diffuse small vessel atheromatous irregularity. POSTERIOR CIRCULATION: Both vertebral arteries patent to the vertebrobasilar junction without high-grade stenosis. Right vertebral artery dominant. Posterior inferior cerebral arteries patent proximally. Short fenestration noted within the proximal basilar artery. Scattered atheromatous irregularity within the mid-distal basilar without high-grade stenosis. Superior cerebral arteries patent bilaterally. Right PCA predominantly supplied via the basilar. Predominant fetal type origin left PCA. Moderate to severe bilateral P2 stenoses, left worse than right. Additional extensive atheromatous irregularity throughout the PCAs bilaterally. IMPRESSION: MRI HEAD IMPRESSION: 1. No acute intracranial abnormality. 2. Large chronic left PCA territory infarct. 3. Age-related cerebral atrophy with underlying advanced chronic microvascular ischemic disease MRA HEAD IMPRESSION: 1. Severe intracranial atherosclerotic disease, similar to previous. Notable findings include severe supraclinoid right ICA stenosis, mild-to-moderate M1 stenoses, with severe bilateral P2 stenoses. Extensive distal small vessel atheromatous irregularity throughout the intracranial circulation. 2. 9 mm focal outpouching at the petrous left ICA, suspicious for fusiform aneurysm,  similar to previous. Electronically Signed   By: Jeannine Boga M.D.   On: 03/24/2018 06:20   Dg Chest Portable 1 View  Result Date: 03/23/2018 CLINICAL DATA:  Patient here with spouse who report that patient developed increased weakness and dizziness around 11am today. States that she [facility] and was sent to ED for further evaluation. EXAM: PORTABLE CHEST 1 VIEW COMPARISON:  05/26/2009 FINDINGS: The cardiac silhouette is normal in size. No mediastinal or hilar masses. No evidence of adenopathy. Clear lungs.  No pleural effusion or pneumothorax. Skeletal structures are grossly intact. IMPRESSION: No active disease. Electronically Signed   By: Lajean Manes M.D.   On: 03/23/2018 18:44    Medications:   . atorvastatin  20 mg Oral QPM  . clopidogrel  75 mg Oral Daily  . docusate sodium  100 mg Oral BID  . finasteride  5 mg Oral Daily  . gabapentin  300 mg Oral QHS  . insulin detemir  4 Units Subcutaneous QHS  . lamoTRIgine  100 mg Oral BID  . latanoprost  1 drop Both Eyes QHS  . [START ON 03/25/2018] memantine  10 mg Oral BID  . sodium bicarbonate  650 mg Oral TID  . tamsulosin  0.4 mg Oral QPC supper  . timolol  1 drop Both Eyes BID  . [START ON 03/25/2018] venlafaxine XR  75 mg Oral BID   Continuous Infusions: . sodium chloride 50 mL/hr at 03/24/18 0526  . cefTRIAXone (ROCEPHIN)  IV       LOS: 0 days   Geradine Girt  [facility]   *Please refer to [email or web address], password [email or web address] to get updated schedule on who will round on this patient, as  hospitalists switch teams weekly. If 7PM-7AM, please contact night-coverage at [email or web address], password [email or web address] for any overnight needs.  03/24/2018, 12:42 PM

## 2018-03-25 ENCOUNTER — Other Ambulatory Visit: Payer: Self-pay

## 2018-03-25 DIAGNOSIS — I5032 Chronic diastolic (congestive) heart failure: Secondary | ICD-10-CM

## 2018-03-25 DIAGNOSIS — G9340 Encephalopathy, unspecified: Secondary | ICD-10-CM

## 2018-03-25 LAB — GLUCOSE, CAPILLARY
Glucose-Capillary: 147 mg/dL — ABNORMAL HIGH (ref 70–99)
Glucose-Capillary: 366 mg/dL — ABNORMAL HIGH (ref 70–99)
Glucose-Capillary: 148 mg/dL — ABNORMAL HIGH (ref 70–99)
Glucose-Capillary: 147 mg/dL — ABNORMAL HIGH (ref 70–99)

## 2018-03-25 LAB — BASIC METABOLIC PANEL
Anion gap: 7 (ref 5–15)
BUN: 48 mg/dL — ABNORMAL HIGH (ref 8–23)
CO2: 22 mmol/L (ref 22–32)
Calcium: 8.5 mg/dL — ABNORMAL LOW (ref 8.9–10.3)
Chloride: 111 mmol/L (ref 98–111)
Creatinine, Ser: 3.09 mg/dL — ABNORMAL HIGH (ref 0.61–1.24)
GFR calc Af Amer: 22 mL/min — ABNORMAL LOW (ref >60)
GFR calc non Af Amer: 19 mL/min — ABNORMAL LOW (ref >60)
Glucose, Bld: 160 mg/dL — ABNORMAL HIGH (ref 70–99)
Potassium: 4.5 mmol/L (ref 3.5–5.1)
Sodium: 140 mmol/L (ref 135–145)

## 2018-03-25 LAB — CBC
HCT: 33.8 % — ABNORMAL LOW (ref 39.0–52.0)
Hemoglobin: 10.7 g/dL — ABNORMAL LOW (ref 13.0–17.0)
MCH: 30.1 pg (ref 26.0–34.0)
MCHC: 31.7 g/dL (ref 30.0–36.0)
MCV: 95.2 fL (ref 80.0–100.0)
NRBC: 0 % (ref 0.0–0.2)
PLATELETS: 159 10*3/uL (ref 150–400)
RBC: 3.55 MIL/uL — AB (ref 4.22–5.81)
RDW: 13.2 % (ref 11.5–15.5)
WBC: 13.2 10*3/uL — ABNORMAL HIGH (ref 4.0–10.5)

## 2018-03-25 LAB — VDRL, CSF: VDRL Quant, CSF: NONREACTIVE

## 2018-03-25 MED ORDER — HEPARIN SODIUM (PORCINE) 5000 UNIT/ML IJ SOLN
5000.0000 [IU] | Freq: Three times a day (TID) | INTRAMUSCULAR | Status: DC
  Administered 2018-03-25 – 2018-03-26 (×3): 5000 [IU] via SUBCUTANEOUS
  Filled 2018-03-25 (×3): qty 1

## 2018-03-25 MED ORDER — GLUCERNA SHAKE PO LIQD
237.0000 mL | Freq: Three times a day (TID) | ORAL | Status: DC
  Administered 2018-03-25: 237 mL via ORAL

## 2018-03-25 MED ORDER — SODIUM CHLORIDE 0.9 % IV SOLN
1.0000 g | INTRAVENOUS | Status: DC
  Administered 2018-03-25: 1 g via INTRAVENOUS
  Filled 2018-03-25 (×2): qty 1

## 2018-03-25 NOTE — Consult Note (Signed)
   [facility] CM Inpatient Consult   03/25/2018  [name] 1945-12-29 [id number]   Patient is currently active with [facility] for chronic disease management services.  Patient has been engaged by a [place] and Education officer, museum. Spoke with patient and wife, [name], at bedside. Patient's wife states that she appreciates the assistance in obtaining community resources to assist patient in the home and welcomes continued involvement. Denies any additional needs at this time.   Of note, [facility] Care Management services does not replace or interfere with any services that are needed or arranged by inpatient case management or social work.    Netta Cedars, MSN, [facility] Liaison Nurse Mobile Phone [phone]  Toll free office [phone]

## 2018-03-25 NOTE — Evaluation (Signed)
Occupational Therapy Evaluation Patient Details Name: [name]. MRN: [id number] DOB: [birth year] Today's Date: 03/25/2018    History of Present Illness Pt is a 73 y.o. M with significant PMH of seizure disorder, right BKA, CKD, DM2, HTN, dementia who was admitted with acute encephalopathy   Clinical Impression   Upon arrival, pt was supine in bed and sleeping. Pt with decreased arousal and stating "what tent is this?" At end of session, pt reporting he lives with his wife but unsure of support level. Pt currently requiring Min A for LB ADLs, Min-Mod A for functional transfers, and Min A for functional mobility with RW. Pt presenting with poor balance and left lateral drifting during mobility as seen by bumping into objects. Pt currently presenting as a fall risk and will require increased support. Pt would benefit from further acute OT to facilitate safe dc. Pending pt support, recommend dc to home with HHOT for further OT to optimize safety, independence with ADLs, and return to PLOF. If pt does not have 24/7 support, will need SNF for post-acute rehab to increase safety with ADLs and decrease fall risk.     Follow Up Recommendations  Home health OT;Supervision/Assistance - 24 hour(Pending 24/7 support, pt may need SNF)    Equipment Recommendations  Other (comment)(Confirm DME needs with family)    Recommendations for Other Services PT consult     Precautions / Restrictions Precautions Precautions: Fall Precaution Comments: high fall risk, right BKA with prosthetic Restrictions Weight Bearing Restrictions: No      Mobility Bed Mobility Overal bed mobility: Needs Assistance Bed Mobility: Supine to Sit     Supine to sit: Mod assist     General bed mobility comments: Mod A to elevate trunk  Transfers Overall transfer level: Needs assistance Equipment used: Rolling walker (2 wheeled) Transfers: Sit to/from Stand Sit to Stand: Min assist         General transfer  comment: Min A for stability and to correct balance    Balance Overall balance assessment: Needs assistance Sitting-balance support: Feet supported Sitting balance-Leahy Scale: Good     Standing balance support: Bilateral upper extremity supported Standing balance-Leahy Scale: Poor                             ADL either performed or assessed with clinical judgement   ADL Overall ADL's : Needs assistance/impaired Eating/Feeding: Set up;Supervision/ safety;Sitting Eating/Feeding Details (indicate cue type and reason): signficant time to manage cup and put straw into cup Grooming: Min guard;Sitting   Upper Body Bathing: Min guard;Sitting   Lower Body Bathing: Minimal assistance;Sit to/from stand   Upper Body Dressing : Min guard;Sitting   Lower Body Dressing: Minimal assistance;Sit to/from stand Lower Body Dressing Details (indicate cue type and reason): Pt donning his right shoe and prothetic. Min A for pushing right heel into tennis shoe.  Toilet Transfer: Minimal assistance;Ambulation;RW;Moderate assistance(simulated to recliner) [employer] forces technical officer Details (indicate cue type and reason): Pt requiring Min A to correct balance once in standing and then Mod A for safe descent         Functional mobility during ADLs: Minimal assistance;Rolling walker General ADL Comments: Pt presenting with lethargy and requiring significant time to perform ADLs including donning prothetic. Pt performing functional mobility with Min A for balacne and safety. Pt presenting with lateral drift and running into objects on left side throughout session     Vision Baseline Vision/History: Wears glasses  Perception     Praxis      Pertinent Vitals/Pain Pain Assessment: No/denies pain     Hand Dominance Right   Extremity/Trunk Assessment Upper Extremity Assessment Upper Extremity Assessment: Overall WFL for tasks assessed   Lower Extremity Assessment Lower Extremity  Assessment: Defer to PT evaluation;RLE deficits/detail RLE Deficits / Details: BKA   Cervical / Trunk Assessment Cervical / Trunk Assessment: Other exceptions Cervical / Trunk Exceptions: left lateral lean   Communication Communication Communication: No difficulties   Cognition Arousal/Alertness: Awake/alert;Lethargic Behavior During Therapy: WFL for tasks assessed/performed Overall Cognitive Status: History of cognitive impairments - at baseline                                 General Comments: history of dementia. Upon arrival, pt was lethargic and difficult to arouse. Pt asking "what tent is this?" and provided cues it was [facility]. At end of session, pt able to state he was at the hospital and presented with increased arousal. Pt requiring increased cues and able to perform ADLs with significant time.    General Comments       Exercises     Shoulder Instructions      Home Living Family/patient expects to be discharged to:: Private residence Living Arrangements: Spouse/significant other Available Help at Discharge: Available 24 hours/day;Family Type of Home: House Home Access: Ramped entrance     Home Layout: One [place]: [place] - 2 wheels;Cane - single point;Wheelchair - manual;Bedside commode   Additional Comments: Pt with lethargy and difficulty providing home information. When asked about who he lives with, pt denied he had a wife and stated "I don't know". At end of session, pt confirming he was married.      Prior Functioning/Environment Level of Independence: Needs assistance  Gait / Transfers Assistance Needed: uses RW with prosthesis. pt wife assists with transfers and ambulation ADL's / Homemaking Assistance Needed: pt uses BSC and sponge bathes. Pt can bathe and toliet self with set up            OT Problem List: Decreased strength;Decreased range of motion;Impaired balance (sitting and/or  standing);Decreased activity tolerance;Decreased cognition;Decreased safety awareness;Decreased knowledge of precautions;Decreased knowledge of use of DME or AE      OT Treatment/Interventions: Self-care/ADL training;Therapeutic exercise;Energy conservation;DME and/or AE instruction;Therapeutic activities;Patient/family education    OT Goals(Current goals can be found in the care plan section) Acute Rehab OT Goals Patient Stated Goal: "Sit by the window" OT Goal Formulation: With patient Time For Goal Achievement: 04/08/18 Potential to Achieve Goals: Good ADL Goals Pt Will Perform Grooming: with supervision;with set-up;standing Pt Will Perform Lower Body Dressing: with set-up;with supervision;sit to/from stand Pt Will Transfer to Toilet: with set-up;with supervision;ambulating;bedside commode Pt Will Perform Toileting - Clothing Manipulation and hygiene: with set-up;with supervision;sit to/from stand  OT Frequency: Min 2X/week   Barriers to D/C:            Co-evaluation              AM-PAC OT "6 Clicks" Daily Activity     Outcome Measure Help from another person eating meals?: A Little Help from another person taking care of personal grooming?: A Little Help from another person toileting, which includes using toliet, bedpan, or urinal?: A Lot Help from another person bathing (including washing, rinsing, drying)?: A Little Help  from another person to put on and taking off regular upper body clothing?: A Little Help from another person to put on and taking off regular lower body clothing?: A Little 6 Click Score: 17   End of Session Equipment Utilized During Treatment: Gait belt;Rolling walker Nurse Communication: Mobility status  Activity Tolerance: Patient tolerated treatment well Patient left: in chair;with call bell/phone within reach;with chair alarm set  OT Visit Diagnosis: Unsteadiness on feet (R26.81);Other abnormalities of gait and mobility (R26.89);Muscle weakness  (generalized) (M62.81);Other symptoms and signs involving cognitive function                Time: 6606-0045 OT Time Calculation (min): 38 min Charges:  OT General Charges $OT Visit: 1 Visit OT Evaluation $OT Eval Moderate Complexity: 1 Mod OT Treatments $Self Care/Home Management : 23-37 mins  Letzy Gullickson MSOT, OTR/L Acute Rehab Pager: [phone] Office: [place] 03/25/2018, 5:13 PM

## 2018-03-25 NOTE — Progress Notes (Signed)
Progress Note    [name].  OEV:[id number] DOB: 1945-10-30  DOA: 03/23/2018 PCP: Deland Pretty, MD    Brief Narrative:     Medical records reviewed and are as summarized below:  [name]. is an 73 y.o. male with medical history significant of Seizure disorder, CKD, DM 2, HTN, dementia  who was brought to the hospital for acute encephalopathy  Assessment/Plan:   Active Problems:   DM2 (diabetes mellitus, type 2) ([place])   BPH (benign prostatic hyperplasia)   Essential hypertension   Encephalopathy acute   CKD (chronic kidney disease) stage 4, GFR 15-29 ml/min (HCC)   Chronic diastolic heart failure ([place])   Memory disorder   Seizure disorder ([place])   Acute encephalopathy   Dysphagia   Chronic systolic CHF (congestive heart failure) (HCC)   SIRS (systemic inflammatory response syndrome) ([place])  Sepsis secondary to UTI  -Presents with sepsis on admission had fever, tachypnea, and leukocytosis, with acute encephalopathy  -Cultures are negative, has positive urine analysis, urine culture growing gram-negative rods, was on IV meropenem, vancomycin outpatient, currently on IV Rocephin, continue, and adjust antibiotic as needed once urine cultures are known -LP with no evidence of meningitis, as well cultures remain negative  Acute metabolic encephalopathy -This is secondary to infectious process, significantly improved.  H/o dementia with gait disturbance -New with supportive care, PT consulted  Essential hypertension  -Continue with home meds  CKD (chronic kidney disease) stage 4, GFR 15-29 ml/min (HCC)  -avoid nephrotoxic medications, at baseline   Anemia -anemia of chronic kidney disease, at baseline  Chronic diastolic heart failure (HCC) -2 hold diuresis, appears to be dry, continue with IV fluids   Hx of CVA  -continue Plavix  -MTI w/o acute CVA  Dm 2-   -ordered SSI -until eating hold any other meds  History of seizure disorder - continue  Lamictal    Family Communication/Anticipated D/C date and plan/Code Status   DVT prophylaxis: scd, Conrad heparin Code Status: Full Code.  Family Communication: none bedside Disposition Plan: Home when stable   Medical Consultants:    None.   Subjective:   Patient denies any complaints today, no fever, no chills, no chest pain or shortness of breath  Objective:    Vitals:   03/24/18 1400 03/24/18 1618 03/24/18 2112 03/25/18 0540  BP: (!) 143/63 (!) 178/68 (!) 159/101 (!) 154/63  Pulse: 64 64 71 66  Resp: 11  16 20   Temp:  98.6 F (37 C) 99.9 F (37.7 C) 98.6 F (37 C)  TempSrc:  Oral Oral Oral  SpO2: 100% 100% 100% 100%    Intake/Output Summary (Last 24 hours) at 03/25/2018 1128 Last data filed at 03/25/2018 1000 Gross per 24 hour  Intake 1482.99 ml  Output 1000 ml  Net 482.99 ml   There were no vitals filed for this visit.  Exam:  Awake Alert, Oriented X 2, more appropriate and conversant today Symmetrical Chest wall movement, Good air movement bilaterally, CTAB RRR,No Gallops,Rubs or new Murmurs, No Parasternal Heave +ve B.Sounds, Abd Soft, No tenderness, No rebound - guarding or rigidity. Right lower extremity with no wounds, with trans metatarsal amputation, left lower extremity with BKA   Data Reviewed:   I have personally reviewed following labs and imaging studies:  Labs: Labs show the following:   Basic Metabolic Panel: Recent Labs  Lab 03/23/18 1607 03/24/18 0619 03/25/18 0504  NA 138 138 140  K 4.2 3.7 4.5  CL 102  103 111  CO2 26 26 22   GLUCOSE 186* 212* 160*  BUN 50* 48* 48*  CREATININE 3.17* 3.02* 3.09*  CALCIUM 9.2 8.0* 8.5*  MG  --  1.8  --   PHOS  --  3.9  --    GFR Estimated Creatinine Clearance: 19.7 mL/min (A) (by C-G formula based on SCr of 3.09 mg/dL (H)). Liver Function Tests: Recent Labs  Lab 03/23/18 1607 03/24/18 0619  AST 22 16  ALT 18 14  ALKPHOS 57 42  BILITOT 0.3 0.7  PROT 7.2 5.2*  ALBUMIN 3.5 2.4*   No  results for input(s): LIPASE, AMYLASE in the last 168 hours. Recent Labs  Lab 03/23/18 1954  AMMONIA 13   Coagulation profile Recent Labs  Lab 03/23/18 1607  INR 0.97    CBC: Recent Labs  Lab 03/23/18 1607 03/24/18 0619 03/25/18 0504  WBC 16.1* 12.9* 13.2*  NEUTROABS 14.7*  --   --   HGB 12.4* 9.4* 10.7*  HCT 39.7 30.3* 33.8*  MCV 93.6 95.3 95.2  PLT 169 145* 159   Cardiac Enzymes: Recent Labs  Lab 03/24/18 0005 03/24/18 0619 03/24/18 1602  TROPONINI <0.03 <0.03 <0.03   BNP (last 3 results) No results for input(s): PROBNP in the last 8760 hours. CBG: Recent Labs  Lab 03/23/18 2341 03/24/18 1323 03/24/18 1804 03/24/18 2112 03/25/18 0836  GLUCAP 193* 189* 182* 111* 147*   D-Dimer: No results for input(s): DDIMER in the last 72 hours. Hgb A1c: No results for input(s): HGBA1C in the last 72 hours. Lipid Profile: No results for input(s): CHOL, HDL, LDLCALC, TRIG, CHOLHDL, LDLDIRECT in the last 72 hours. Thyroid function studies: Recent Labs    03/24/18 0619  TSH 0.935   Anemia work up: No results for input(s): VITAMINB12, FOLATE, FERRITIN, TIBC, IRON, RETICCTPCT in the last 72 hours. Sepsis Labs: Recent Labs  Lab 03/23/18 1607 03/23/18 2340 03/24/18 0619 03/25/18 0504  WBC 16.1*  --  12.9* 13.2*  LATICACIDVEN  --  1.4 1.7  --     Microbiology Recent Results (from the past 240 hour(s))  Urine culture     Status: Abnormal (Preliminary result)   Collection Time: 03/23/18  7:12 PM  Result Value Ref Range Status   Specimen Description URINE, RANDOM  Final   Special Requests   Final    NONE Performed at [facility], [address], [place], [place] [address]    Culture >=100,000 COLONIES/mL GRAM NEGATIVE RODS (A)  Final   Report Status PENDING  Incomplete  Blood culture (routine x 2)     Status: None (Preliminary result)   Collection Time: 03/23/18  7:32 PM  Result Value Ref Range Status   Specimen Description BLOOD RIGHT ANTECUBITAL   Final   Special Requests   Final    BOTTLES DRAWN AEROBIC AND ANAEROBIC Blood Culture results may not be optimal due to an inadequate volume of blood received in culture bottles   Culture NO GROWTH < 24 HOURS  Final   Report Status PENDING  Incomplete  Respiratory Panel by PCR     Status: None   Collection Time: 03/23/18  7:50 PM  Result Value Ref Range Status   Adenovirus NOT DETECTED NOT DETECTED Final   Coronavirus 229E NOT DETECTED NOT DETECTED Final   Coronavirus HKU1 NOT DETECTED NOT DETECTED Final   Coronavirus NL63 NOT DETECTED NOT DETECTED Final   Coronavirus OC43 NOT DETECTED NOT DETECTED Final   Metapneumovirus NOT DETECTED NOT DETECTED Final  Rhinovirus / Enterovirus NOT DETECTED NOT DETECTED Final   Influenza A NOT DETECTED NOT DETECTED Final   Influenza B NOT DETECTED NOT DETECTED Final   Parainfluenza Virus 1 NOT DETECTED NOT DETECTED Final   Parainfluenza Virus 2 NOT DETECTED NOT DETECTED Final   Parainfluenza Virus 3 NOT DETECTED NOT DETECTED Final   Parainfluenza Virus 4 NOT DETECTED NOT DETECTED Final   Respiratory Syncytial Virus NOT DETECTED NOT DETECTED Final   Bordetella pertussis NOT DETECTED NOT DETECTED Final   Chlamydophila pneumoniae NOT DETECTED NOT DETECTED Final   Mycoplasma pneumoniae NOT DETECTED NOT DETECTED Final  CSF culture     Status: None (Preliminary result)   Collection Time: 03/23/18  9:46 PM  Result Value Ref Range Status   Specimen Description BACK  Final   Special Requests NONE  Final   Gram Stain   Final    CYTOSPIN SMEAR WBC PRESENT, PREDOMINANTLY MONONUCLEAR NO ORGANISMS SEEN Performed at [facility], [address], [place],  [address]    Culture NO GROWTH 2 DAYS  Final   Report Status PENDING  Incomplete  Blood culture (routine x 2)     Status: None (Preliminary result)   Collection Time: 03/23/18 11:39 PM  Result Value Ref Range Status   Specimen Description BLOOD LEFT FOREARM  Final   Special Requests   Final     BOTTLES DRAWN AEROBIC AND ANAEROBIC Blood Culture results may not be optimal due to an inadequate volume of blood received in culture bottles   Culture NO GROWTH < 24 HOURS  Final   Report Status PENDING  Incomplete    Procedures and diagnostic studies:  Ct Head Wo Contrast  Result Date: 03/23/2018 CLINICAL DATA:  Onset dizziness and weakness of the 11 a.m. today. EXAM: CT HEAD WITHOUT CONTRAST TECHNIQUE: Contiguous axial images were obtained from the base of the skull through the vertex without intravenous contrast. COMPARISON:  Head CT scan 03/20/2018 and 02/19/2018. FINDINGS: Brain: No evidence of acute infarction, hemorrhage, hydrocephalus, extra-axial collection or mass lesion/mass effect. Atrophy, extensive chronic microvascular ischemic change and remote left PCA territory infarct are all unchanged. Vascular: Extensive atherosclerosis noted. Skull: Intact.  No focal lesion. Sinuses/Orbits: Status post cataract surgery.  Otherwise negative. Other: None. IMPRESSION: No acute abnormality. Atrophy, extensive chronic microvascular ischemic change and remote left PCA infarct. Atherosclerosis. Electronically Signed   By: Inge Rise M.D.   On: 03/23/2018 16:48   Ct Cervical Spine Wo Contrast  Result Date: 03/23/2018 CLINICAL DATA:  C-spine trauma, high clinical risk (NEXUS/CCR). Right-sided weakness. EXAM: CT CERVICAL SPINE WITHOUT CONTRAST TECHNIQUE: Multidetector CT imaging of the cervical spine was performed without intravenous contrast. Multiplanar CT image reconstructions were also generated. COMPARISON:  Cervical spine CT 02/01/2018 FINDINGS: Alignment: Normal. Skull base and vertebrae: No acute fracture. Vertebral body heights are maintained. The dens and skull base are intact. Heterogeneous marrow suggesting osteopenia. Soft tissues and spinal canal: No prevertebral fluid or swelling. No visible canal hematoma. Disc levels: Disc space narrowing and endplate spurring, most prominent at  C6-C7. Multilevel facet arthropathy. Multilevel neural foraminal stenosis without canal compromise. Upper chest: Chronic right tracheal diverticulum. Biapical pleuroparenchymal scarring. Advanced carotid calcifications. Other: None. IMPRESSION: Degenerative change in the cervical spine without acute fracture or subluxation. Electronically Signed   By: Keith Rake M.D.   On: 03/23/2018 20:34   Mr Jodene Nam Head Wo Contrast  Result Date: 03/24/2018 CLINICAL DATA:  Initial evaluation for increased weakness and dizziness. Evaluate for stroke. EXAM: MRI  HEAD WITHOUT CONTRAST MRA HEAD WITHOUT CONTRAST TECHNIQUE: Multiplanar, multiecho pulse sequences of the brain and surrounding structures were obtained without intravenous contrast. Angiographic images of the head were obtained using MRA technique without contrast. COMPARISON:  Prior CT from 03/23/2017 as well as previous MRI from 10/29/2016. FINDINGS: MRI HEAD FINDINGS Brain: Diffuse prominence of the CSF containing spaces compatible with generalized age-related cerebral atrophy. Extensive confluent and patchy T2/FLAIR hyperintensity within the periventricular deep white matter both cerebral hemispheres most consistent with chronic microvascular ischemic disease, advanced in nature. Superimposed remote lacunar infarcts present within the right basal ganglia and left thalamus. Small remote left cerebellar infarct. Large remote left PCA territory infarct with associated encephalomalacia and gliosis. No abnormal foci of restricted diffusion to suggest acute or subacute ischemia. Gray-white matter differentiation maintained. No evidence for acute intracranial hemorrhage. No mass lesion, midline shift or mass effect. Diffuse ventricular prominence related to global parenchymal volume loss of hydrocephalus. No extra-axial fluid collection. Pituitary gland within normal limits. Vascular: Major intracranial vascular flow voids maintained at the skull base. Skull and upper  cervical spine: Craniocervical junction within normal limits. Upper cervical spine normal. Bone marrow signal intensity within normal limits. No scalp soft tissue abnormality. Sinuses/Orbits: Patient status post bilateral ocular lens replacement. Scattered mucosal thickening throughout the paranasal sinuses, chronic in appearance. No significant mastoid effusion. Inner ear structures grossly normal. Other: None. MRA HEAD FINDINGS ANTERIOR CIRCULATION: Distal cervical segments of the internal carotid arteries are patent with symmetric antegrade flow. Petrous segments patent bilaterally. 9 mm focal outpouching arising from the petrous left ICA suspicious for aneurysm, stable from previous. Extensive atheromatous irregularity throughout the cavernous/supraclinoid segments bilaterally. There is severe stenosis at the supraclinoid right ICA (series 9, image 88), similar to previous. Left A1 patent. Hypoplastic right A1. Grossly normal anterior communicating artery. Atheromatous irregularity throughout the anterior cerebral arteries without high-grade stenosis. Focal moderate stenoses involving the mid M1 segments bilaterally, left greater than right. Normal MCA bifurcations. Distal MCA branches perfused and fairly symmetric although demonstrate diffuse small vessel atheromatous irregularity. POSTERIOR CIRCULATION: Both vertebral arteries patent to the vertebrobasilar junction without high-grade stenosis. Right vertebral artery dominant. Posterior inferior cerebral arteries patent proximally. Short fenestration noted within the proximal basilar artery. Scattered atheromatous irregularity within the mid-distal basilar without high-grade stenosis. Superior cerebral arteries patent bilaterally. Right PCA predominantly supplied via the basilar. Predominant fetal type origin left PCA. Moderate to severe bilateral P2 stenoses, left worse than right. Additional extensive atheromatous irregularity throughout the PCAs bilaterally.  IMPRESSION: MRI HEAD IMPRESSION: 1. No acute intracranial abnormality. 2. Large chronic left PCA territory infarct. 3. Age-related cerebral atrophy with underlying advanced chronic microvascular ischemic disease MRA HEAD IMPRESSION: 1. Severe intracranial atherosclerotic disease, similar to previous. Notable findings include severe supraclinoid right ICA stenosis, mild-to-moderate M1 stenoses, with severe bilateral P2 stenoses. Extensive distal small vessel atheromatous irregularity throughout the intracranial circulation. 2. 9 mm focal outpouching at the petrous left ICA, suspicious for fusiform aneurysm, similar to previous. Electronically Signed   By: Jeannine Boga M.D.   On: 03/24/2018 06:20   Mr Brain Wo Contrast  Result Date: 03/24/2018 CLINICAL DATA:  Initial evaluation for increased weakness and dizziness. Evaluate for stroke. EXAM: MRI HEAD WITHOUT CONTRAST MRA HEAD WITHOUT CONTRAST TECHNIQUE: Multiplanar, multiecho pulse sequences of the brain and surrounding structures were obtained without intravenous contrast. Angiographic images of the head were obtained using MRA technique without contrast. COMPARISON:  Prior CT from 03/23/2017 as well as previous MRI from 10/29/2016. FINDINGS: MRI HEAD  FINDINGS Brain: Diffuse prominence of the CSF containing spaces compatible with generalized age-related cerebral atrophy. Extensive confluent and patchy T2/FLAIR hyperintensity within the periventricular deep white matter both cerebral hemispheres most consistent with chronic microvascular ischemic disease, advanced in nature. Superimposed remote lacunar infarcts present within the right basal ganglia and left thalamus. Small remote left cerebellar infarct. Large remote left PCA territory infarct with associated encephalomalacia and gliosis. No abnormal foci of restricted diffusion to suggest acute or subacute ischemia. Gray-white matter differentiation maintained. No evidence for acute intracranial  hemorrhage. No mass lesion, midline shift or mass effect. Diffuse ventricular prominence related to global parenchymal volume loss of hydrocephalus. No extra-axial fluid collection. Pituitary gland within normal limits. Vascular: Major intracranial vascular flow voids maintained at the skull base. Skull and upper cervical spine: Craniocervical junction within normal limits. Upper cervical spine normal. Bone marrow signal intensity within normal limits. No scalp soft tissue abnormality. Sinuses/Orbits: Patient status post bilateral ocular lens replacement. Scattered mucosal thickening throughout the paranasal sinuses, chronic in appearance. No significant mastoid effusion. Inner ear structures grossly normal. Other: None. MRA HEAD FINDINGS ANTERIOR CIRCULATION: Distal cervical segments of the internal carotid arteries are patent with symmetric antegrade flow. Petrous segments patent bilaterally. 9 mm focal outpouching arising from the petrous left ICA suspicious for aneurysm, stable from previous. Extensive atheromatous irregularity throughout the cavernous/supraclinoid segments bilaterally. There is severe stenosis at the supraclinoid right ICA (series 9, image 88), similar to previous. Left A1 patent. Hypoplastic right A1. Grossly normal anterior communicating artery. Atheromatous irregularity throughout the anterior cerebral arteries without high-grade stenosis. Focal moderate stenoses involving the mid M1 segments bilaterally, left greater than right. Normal MCA bifurcations. Distal MCA branches perfused and fairly symmetric although demonstrate diffuse small vessel atheromatous irregularity. POSTERIOR CIRCULATION: Both vertebral arteries patent to the vertebrobasilar junction without high-grade stenosis. Right vertebral artery dominant. Posterior inferior cerebral arteries patent proximally. Short fenestration noted within the proximal basilar artery. Scattered atheromatous irregularity within the mid-distal  basilar without high-grade stenosis. Superior cerebral arteries patent bilaterally. Right PCA predominantly supplied via the basilar. Predominant fetal type origin left PCA. Moderate to severe bilateral P2 stenoses, left worse than right. Additional extensive atheromatous irregularity throughout the PCAs bilaterally. IMPRESSION: MRI HEAD IMPRESSION: 1. No acute intracranial abnormality. 2. Large chronic left PCA territory infarct. 3. Age-related cerebral atrophy with underlying advanced chronic microvascular ischemic disease MRA HEAD IMPRESSION: 1. Severe intracranial atherosclerotic disease, similar to previous. Notable findings include severe supraclinoid right ICA stenosis, mild-to-moderate M1 stenoses, with severe bilateral P2 stenoses. Extensive distal small vessel atheromatous irregularity throughout the intracranial circulation. 2. 9 mm focal outpouching at the petrous left ICA, suspicious for fusiform aneurysm, similar to previous. Electronically Signed   By: Jeannine Boga M.D.   On: 03/24/2018 06:20   Dg Chest Portable 1 View  Result Date: 03/23/2018 CLINICAL DATA:  Patient here with spouse who report that patient developed increased weakness and dizziness around 11am today. States that she went to [facility] and was sent to ED for further evaluation. EXAM: PORTABLE CHEST 1 VIEW COMPARISON:  05/26/2009 FINDINGS: The cardiac silhouette is normal in size. No mediastinal or hilar masses. No evidence of adenopathy. Clear lungs.  No pleural effusion or pneumothorax. Skeletal structures are grossly intact. IMPRESSION: No active disease. Electronically Signed   By: Lajean Manes M.D.   On: 03/23/2018 18:44    Medications:   . atorvastatin  20 mg Oral QPM  . carvedilol  6.25 mg Oral BID WC  . clopidogrel  75 mg Oral Daily  . docusate sodium  100 mg Oral BID  . feeding supplement (GLUCERNA SHAKE)  237 mL Oral TID BM  . finasteride  5 mg Oral Daily  . gabapentin  300 mg Oral QHS  . insulin aspart   0-5 Units Subcutaneous QHS  . insulin aspart  0-9 Units Subcutaneous TID WC  . insulin detemir  4 Units Subcutaneous QHS  . isosorbide-hydrALAZINE  1 tablet Oral TID  . lamoTRIgine  100 mg Oral BID  . latanoprost  1 drop Both Eyes QHS  . memantine  10 mg Oral BID  . senna  1 tablet Oral Daily  . sodium bicarbonate  650 mg Oral TID  . tamsulosin  0.4 mg Oral QPC supper  . timolol  1 drop Both Eyes BID  . venlafaxine XR  75 mg Oral BID   Continuous Infusions: . sodium chloride Stopped (03/25/18 0917)  . cefTRIAXone (ROCEPHIN)  IV Stopped (03/24/18 1420)     LOS: 1 day   Phillips Climes MD [facility]   *Please refer to [email or web address], password [email or web address] to get updated schedule on who will round on this patient, as hospitalists switch teams weekly. If 7PM-7AM, please contact night-coverage at [email or web address], password [email or web address] for any overnight needs.  03/25/2018, 11:28 AM

## 2018-03-25 NOTE — Patient Outreach (Signed)
[facility]) Care Management  03/25/2018  [name] 07-11-1945 [id number]    [facility] coach notified that the patient was admitted to the hospital on 03/23/2018 for acute encephalopathy and SIRS.   Plan: Will notify the Hospital Liaison to follow.  Close the program at this time and notify the physician.  Lazaro Arms RN, BSN, [place] Direct Dial:  [phone]  Fax: [phone]

## 2018-03-25 NOTE — Progress Notes (Signed)
Initial Nutrition Assessment  DOCUMENTATION CODES:   Severe malnutrition in context of chronic illness  INTERVENTION:   - Glucerna Shake po TID, each supplement provides 220 kcal and 10 grams of protein  - Encourage adequate PO intake  NUTRITION DIAGNOSIS:   Severe Malnutrition related to chronic illness (dementia, CHF, CKD stage III) as evidenced by severe fat depletion, severe muscle depletion.  GOAL:   Patient will meet greater than or equal to 90% of their needs  MONITOR:   PO intake, Supplement acceptance, Labs, Weight trends, I & O's  REASON FOR ASSESSMENT:   Malnutrition Screening Tool    ASSESSMENT:   73 year old male who presented to the ED on 1/4 with AMS. PMH significant for CHF, CKD stage III, T2DM, HTN, PVD s/p L BKA, dementia, CVA, and seizure disorder. Pt admitted for SIRS.  Spoke with pt at bedside. Pt had just finished brushing his teeth and was drinking water at time of visit.  Pt in good spirits, stating that he is feeling fine. Pt reports that he was very hungry for breakfast. Noted ~90% completed breakfast meal tray at bedside.  Pt states that he normally eats 3 meals daily at home. Pt reports that his wife cooks for him but that he can cook for himself. A typical breakfast includes coffee with grits or waffles or cereal. A typical lunch includes a [place] sandwich or chicken sandwich. A typical dinner includes spaghetti, green beans, and okra.  Pt denies recent weight loss and reports his current weight as 138 lbs and his UBW as 140 lbs. Weight on 03/21/18 was 138 lbs. Noted steady weight loss over the past 10 months. Pt has lost 9.5 kg in 10 months. This is a 13.2% weight loss which is not significant for timeframe.  Pt denies any issues chewing or swallowing and states that he is not nauseous.  Pt amenable to receiving oral nutrition supplement. Pt with questions regarding where he can purchase these items after d/c. Given good PO intake, will order  Glucerna Shake. If PO intake decreases, will switch to Ensure Enlive. Pt reports strawberry flavor.  Medications reviewed and include: Colace, SSI, Levemir, Senokot, sodium bicarb TID, IV antibiotics  Labs reviewed: BUN 48 (H), creatinine 3.09 (H) CBG's: 147, 111, 182, 189 x 24 hours  UOP: 1000 ml x 24 hours  NUTRITION - FOCUSED PHYSICAL EXAM:    Most Recent Value  Orbital Region  Moderate depletion  Upper Arm Region  Severe depletion  Thoracic and Lumbar Region  Severe depletion  Buccal Region  Moderate depletion  Temple Region  Mild depletion  Clavicle Bone Region  Severe depletion  Clavicle and Acromion Bone Region  Severe depletion  Scapular Bone Region  Severe depletion  Dorsal Hand  Severe depletion  Patellar Region  Severe depletion  Anterior Thigh Region  Severe depletion  Posterior Calf Region  Severe depletion  Edema (RD Assessment)  None  Hair  Reviewed  Eyes  Reviewed  Mouth  Reviewed  Skin  Reviewed  Nails  Reviewed       Diet Order:   Diet Order            DIET SOFT Room service appropriate? Yes; Fluid consistency: Thin  Diet effective now              EDUCATION NEEDS:   No education needs have been identified at this time  Skin:  Skin Assessment: Reviewed RN Assessment  Last BM:  PTA  Height:   Ht Readings  from Last 1 Encounters:  03/21/18 5\' 10"  (1.778 m)    Weight:   Wt Readings from Last 1 Encounters:  03/21/18 62.6 kg    Ideal Body Weight:  70.6 kg (using height from 1/2, adjusted for BKA)  BMI:  19.81 kg/m^2  Estimated Nutritional Needs:   Kcal:  1700-1900  Protein:  85-100 grams  Fluid:  1.7-1.9 L    Gaynell Face, MS, RD, LDN Inpatient Clinical Dietitian Pager: [phone] Weekend/After Hours: [phone]

## 2018-03-26 ENCOUNTER — Other Ambulatory Visit: Payer: Medicare Other | Admitting: Internal Medicine

## 2018-03-26 ENCOUNTER — Ambulatory Visit: Payer: Self-pay

## 2018-03-26 DIAGNOSIS — N390 Urinary tract infection, site not specified: Secondary | ICD-10-CM

## 2018-03-26 LAB — GLUCOSE, CAPILLARY
Glucose-Capillary: 112 mg/dL — ABNORMAL HIGH (ref 70–99)
Glucose-Capillary: 178 mg/dL — ABNORMAL HIGH (ref 70–99)

## 2018-03-26 LAB — HSV CULTURE AND TYPING

## 2018-03-26 LAB — URINE CULTURE

## 2018-03-26 MED ORDER — SODIUM CHLORIDE 0.9 % IV SOLN
1.0000 g | INTRAVENOUS | Status: DC
  Administered 2018-03-26: 1 g via INTRAVENOUS
  Filled 2018-03-26: qty 1

## 2018-03-26 MED ORDER — CIPROFLOXACIN HCL 500 MG PO TABS: 500.0000 mg | ORAL_TABLET | Freq: Every day | ORAL | 0 refills | Status: DC

## 2018-03-26 MED ORDER — FUROSEMIDE 40 MG PO TABS: 40.0000 mg | ORAL_TABLET | Freq: Every day | ORAL | Status: DC

## 2018-03-26 MED ORDER — GLUCERNA SHAKE PO LIQD: 237.0000 mL | Freq: Three times a day (TID) | ORAL | 0 refills | Status: DC

## 2018-03-26 MED FILL — CIPROFLOXACIN HCL 500 MG TA: 500 | 5 days supply | Qty: 5 | Fill #0

## 2018-03-26 NOTE — Progress Notes (Signed)
Occupational Therapy Treatment Patient Details Name: [name]. MRN: [id number] DOB: August 27, 1945 Today's Date: 03/26/2018    History of present illness Pt is a 73 y.o. M with significant PMH of seizure disorder, right BKA, CKD, DM2, HTN, dementia who was admitted with acute encephalopathy   OT comments  Pt making slow progress to all set goals. Pt overall min assist with adls in sitting and occasional mod assist in standing due to loss of balance. Feel pt will need 24 hour S and needs to use walker when ambulating during adls at this point in time.     Follow Up Recommendations  Home health OT;Supervision/Assistance - 24 hour    Equipment Recommendations       Recommendations for Other Services      Precautions / Restrictions Precautions Precautions: Fall Precaution Comments: high fall risk, right BKA with prosthetic Required Braces or Orthoses: Other Brace Other Brace: L BKA prosthetic Restrictions Weight Bearing Restrictions: No       Mobility Bed Mobility Overal bed mobility: Needs Assistance Bed Mobility: Supine to Sit     Supine to sit: Min assist;HOB elevated     General bed mobility comments: min assist to sit in upright.  Transfers Overall transfer level: Needs assistance Equipment used: Rolling walker (2 wheeled) Transfers: Sit to/from Stand Sit to Stand: Min assist         General transfer comment: Min A for stability and to correct balance    Balance Overall balance assessment: Needs assistance Sitting-balance support: Feet supported Sitting balance-Leahy Scale: Good     Standing balance support: Bilateral upper extremity supported Standing balance-Leahy Scale: Poor Standing balance comment: Pt relies heavily on walker to stand.  PTA, pt did not have to use walker if he had prosthetic                           ADL either performed or assessed with clinical judgement   ADL Overall ADL's : Needs  assistance/impaired Eating/Feeding: Set up;Supervision/ safety;Sitting Eating/Feeding Details (indicate cue type and reason): Pt fed self but takes a significant amount of time to do so.  Pt transfers with min assist with prosthetic on. Recommend pt sit in chair for meals.             Upper Body Dressing : Supervision/safety;Sitting   Lower Body Dressing: Minimal assistance;Sit to/from stand Lower Body Dressing Details (indicate cue type and reason): Pt donned R prosthetic and L shoe without assist today given extra time. Toilet Transfer: Moderate assistance;Comfort height toilet;Grab bars;RW;Ambulation Toilet Transfer Details (indicate cue type and reason): Pt with difficulty going from sit to stand to low surfaces requiring mod assist to do so even when using hands to support himself. Toileting- Clothing Manipulation and Hygiene: Minimal assistance;Sit to/from stand;Cueing for compensatory techniques Toileting - Clothing Manipulation Details (indicate cue type and reason): Pt required cues to hold to walker with one hand while managing clothing.     Functional mobility during ADLs: Minimal assistance;Rolling walker General ADL Comments: When given extra time, pt can do a fair amount for himself.  Prior to admit, it seems he and his wife had a good set up on what she would help with and what he would do on his own.     Vision   Vision Assessment?: No apparent visual deficits   Perception     Praxis      Cognition Arousal/Alertness: Awake/alert Behavior During Therapy: WFL for tasks  assessed/performed Overall Cognitive Status: History of cognitive impairments - at baseline                                 General Comments: pt was oriented to time, place and situation today.  Slow to process but accurate today.        Exercises     Shoulder Instructions       General Comments Pt doing more for himself; just requires a great amount of time to do so.     Pertinent Vitals/ Pain       Pain Assessment: No/denies pain  Home Living                                          Prior Functioning/Environment              Frequency  Min 2X/week        Progress Toward Goals  OT Goals(current goals can now be found in the care plan section)  Progress towards OT goals: Progressing toward goals  Acute Rehab OT Goals Patient Stated Goal: "Sit by the window" OT Goal Formulation: With patient Time For Goal Achievement: 04/08/18 Potential to Achieve Goals: Good ADL Goals Pt Will Perform Grooming: with supervision;with set-up;standing Pt Will Perform Lower Body Dressing: with set-up;with supervision;sit to/from stand Pt Will Transfer to Toilet: with set-up;with supervision;ambulating;bedside commode Pt Will Perform Toileting - Clothing Manipulation and hygiene: with set-up;with supervision;sit to/from stand  Plan Discharge plan remains appropriate    Co-evaluation                 AM-PAC OT "6 Clicks" Daily Activity     Outcome Measure   Help from another person eating meals?: A Little Help from another person taking care of personal grooming?: A Little Help from another person toileting, which includes using toliet, bedpan, or urinal?: A Lot Help from another person bathing (including washing, rinsing, drying)?: A Little Help from another person to put on and taking off regular upper body clothing?: A Little Help from another person to put on and taking off regular lower body clothing?: A Little 6 Click Score: 17    End of Session Equipment Utilized During Treatment: Rolling walker  OT Visit Diagnosis: Unsteadiness on feet (R26.81);Other abnormalities of gait and mobility (R26.89);Muscle weakness (generalized) (M62.81);Other symptoms and signs involving cognitive function   Activity Tolerance Patient tolerated treatment well   Patient Left in chair;with call bell/phone within reach;with chair alarm  set   Nurse Communication Other (comment)(need to eat meals up in chair)        Time: 5093-2671 OT Time Calculation (min): 27 min  Charges: OT General Charges $OT Visit: 1 Visit OT Treatments $Self Care/Home Management : 23-37 mins  Jinger Neighbors, OTR/L [phone]   Glenford Peers 03/26/2018, 12:00 PM

## 2018-03-26 NOTE — Discharge Instructions (Signed)
Follow with Primary MD Deland Pretty, MD in 7 days   Get CBC, CMP,  checked  by Primary MD next visit.    Activity: As tolerated with Full fall precautions use walker/cane & assistance as needed   Disposition Home    Diet: Heart Healthy , soft,  with feeding assistance and aspiration precautions..   On your next visit with your primary care physician please Get Medicines reviewed and adjusted.   Please request your Prim.MD to go over all Hospital Tests and Procedure/Radiological results at the follow up, please get all Hospital records sent to your Prim MD by signing hospital release before you go home.   If you experience worsening of your admission symptoms, develop shortness of breath, life threatening emergency, suicidal or homicidal thoughts you must seek medical attention immediately by calling 911 or calling your MD immediately  if symptoms less severe.  You Must read complete instructions/literature along with all the possible adverse reactions/side effects for all the Medicines you take and that have been prescribed to you. Take any new Medicines after you have completely understood and accpet all the possible adverse reactions/side effects.   Do not drive, operating heavy machinery, perform activities at heights, swimming or participation in water activities or provide baby sitting services if your were admitted for syncope or siezures until you have seen by Primary MD or a Neurologist and advised to do so again.  Do not drive when taking Pain medications.    Do not take more than prescribed Pain, Sleep and Anxiety Medications  Special Instructions: If you have smoked or chewed Tobacco  in the last 2 yrs please stop smoking, stop any regular Alcohol  and or any Recreational drug use.  Wear Seat belts while driving.   Please note  You were cared for by a hospitalist during your hospital stay. If you have any questions about your discharge medications or the care you  received while you were in the hospital after you are discharged, you can call the unit and asked to speak with the hospitalist on call if the hospitalist that took care of you is not available. Once you are discharged, your primary care physician will handle any further medical issues. Please note that NO REFILLS for any discharge medications will be authorized once you are discharged, as it is imperative that you return to your primary care physician (or establish a relationship with a primary care physician if you do not have one) for your aftercare needs so that they can reassess your need for medications and monitor your lab values.

## 2018-03-26 NOTE — Care Management Note (Signed)
Case Management Note  Patient Details  Name: [name]. MRN: [id number] Date of Birth: 03/27/1945  Subjective/Objective:  Admitted with acute encephalopathy.Hx of seizure disorder, right BKA, CKD, DM2, HTN, dementia. Resides with spouse, [name]. Wife states she has shoulder issues and is limited with pt increasing hx of falls. Has a daughter Sharlot Gowda) who is very supportive and checks on parents regularly. PTA active with [facility] , palliative care services. Owns 3-wheeled walker, BSC, W/C. Pt  with active [facility]. Referral made with [facility]  Prior admit for home health services.    Pinkett,[name] Spouse  [phone] (h)  [phone]  [facility] Daughter    [phone]  [facility] Sister  [phone] (h)  [phone]  ABHINAV, MAYORQUIN    [phone]     Dr. Deland Pretty  Action/Plan: Transition to home with home health services when medically ready.Marland KitchenMarland KitchenNCM follow for TOC needs.  Pt has transportation to home.  Expected Discharge Date:                  Expected Discharge Plan:  [place]  In-House Referral:     Discharge planning Services  CM Consult  Post Acute Care Choice:    Choice offered to:  Patient  DME Arranged:   n/a DME Agency:   n/a  HH Arranged:   PT,Nurse Aide [place] Agency:   Al[facility] Lemmings  Status of Service:  completed  If discussed at Long Length of Stay Meetings, dates discussed:    Additional Comments:  Sharin Mons, RN 03/26/2018, 10:20 AM

## 2018-03-26 NOTE — Progress Notes (Signed)
Physical Therapy Treatment Patient Details Name: [name]. MRN: [id number] DOB: [birth year] Today's Date: 03/26/2018    History of Present Illness Pt is a 73 y.o. M with significant PMH of seizure disorder, right BKA, CKD, DM2, HTN, dementia who was admitted with acute encephalopathy    PT Comments    Pt performed gait training, stair training and functional mobility during session.  He is performing at min assistance overall and will have assistance from his wife and daughter at home.  Pt severely limited with stair negotiation and will require external assistance to negotiate stairs at home.  Wife reports she feels confident to help him with stairs as he required help at baseline.  Plan remains appropriate for return home with HHPT and support from family.    Follow Up Recommendations  Home health PT;Supervision for mobility/OOB     Equipment Recommendations  None recommended by PT(However educated to use RW vs. rollator at home as wife reports his brakes do not lock.  )    Recommendations for Other Services OT consult     Precautions / Restrictions Precautions Precautions: Fall Precaution Comments: high fall risk, right BKA with prosthetic Required Braces or Orthoses: Other Brace Other Brace: L BKA prosthetic Restrictions Weight Bearing Restrictions: No    Mobility  Bed Mobility Overal bed mobility: Needs Assistance Bed Mobility: Supine to Sit     Supine to sit: Min assist;HOB elevated     General bed mobility comments: Pt seated in recliner on arrival  Transfers Overall transfer level: Needs assistance Equipment used: Rolling walker (2 wheeled) Transfers: Sit to/from Stand Sit to Stand: Min assist         General transfer comment: Cues for hand placement to push from seated surface.  Pt slow to ascend.  Demonstrated good eccentric loading returning to chair.    Ambulation/Gait Ambulation/Gait assistance: Min guard Gait Distance (Feet): 160  Feet Assistive device: Rolling walker (2 wheeled) Gait Pattern/deviations: Step-through pattern;Decreased stride length;Trunk flexed     General Gait Details: Pt with improved safety and no overt LOB noted with gait training.  Pt is following commands better for gait training with RW.     Stairs Stairs: Yes Stairs assistance: Mod assist;Min assist Stair Management: No rails;Two rails;Backwards;Forwards Number of Stairs: 4(x1 backwards with RW, X 3 forwards with B rails.  ) General stair comments: Pt required max VCs for technique to perform backwards with RW.  Pt required mod assistance to boost up to stair.  Pt required alternative method as he was unable to perform first method safely.  He used B rails to simulate B HHA into home as this is what he does at baseline.  Pt with improved ability and required min assistance to ascend forward and descend backwards.  Educated on [place] to lead with R LE when ascending as propriception is absent on L side due to prosthesis.     Wheelchair Mobility    Modified Rankin (Stroke Patients Only)       Balance Overall balance assessment: Needs assistance Sitting-balance support: Feet supported Sitting balance-Leahy Scale: Good     Standing balance support: Bilateral upper extremity supported Standing balance-Leahy Scale: Poor Standing balance comment: Pt relies heavily on walker to stand.  PTA, pt did not have to use walker if he had prosthetic                            Cognition Arousal/Alertness: Awake/alert Behavior During  Therapy: WFL for tasks assessed/performed Overall Cognitive Status: History of cognitive impairments - at baseline                                 General Comments: pt was oriented to time, place and situation today.  Slow to process but accurate today.      Exercises      General Comments General comments (skin integrity, edema, etc.): Pt doing more for himself; just requires a  great amount of time to do so.      Pertinent Vitals/Pain Pain Assessment: No/denies pain    Home Living                      Prior Function            PT Goals (current goals can now be found in the care plan section) Acute Rehab PT Goals Patient Stated Goal: "To go home." Potential to Achieve Goals: Good Progress towards PT goals: Progressing toward goals    Frequency    Min 3X/week      PT Plan Current plan remains appropriate    Co-evaluation              AM-PAC PT "6 Clicks" Mobility   Outcome Measure  Help needed turning from your back to your side while in a flat bed without using bedrails?: None Help needed moving from lying on your back to sitting on the side of a flat bed without using bedrails?: A Little Help needed moving to and from a bed to a chair (including a wheelchair)?: A Little Help needed standing up from a chair using your arms (e.g., wheelchair or bedside chair)?: A Little Help needed to walk in hospital room?: A Little Help needed climbing 3-5 steps with a railing? : A Lot 6 Click Score: 18    End of Session Equipment Utilized During Treatment: Gait belt Activity Tolerance: Patient tolerated treatment well Patient left: with call bell/phone within reach;with family/visitor present;in chair;with chair alarm set Nurse Communication: Mobility status PT Visit Diagnosis: Unsteadiness on feet (R26.81);History of falling (Z91.81)     Time: 9794-8016 PT Time Calculation (min) (ACUTE ONLY): 34 min  Charges:  $Gait Training: 8-22 mins $Therapeutic Activity: 8-22 mins                     [name], PTA Acute Rehabilitation Services Pager [phone] Office [phone]     [name] 03/26/2018, 1:45 PM

## 2018-03-26 NOTE — Discharge Summary (Signed)
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 PCA. Moderate to severe bilateral P2 stenoses, left worse than right. Additional extensive atheromatous irregularity throughout the PCAs bilaterally. IMPRESSION: MRI HEAD IMPRESSION: 1. No acute intracranial abnormality. 2. Large chronic left PCA territory infarct. 3. Age-related cerebral atrophy with underlying advanced chronic microvascular ischemic disease MRA HEAD IMPRESSION: 1. Severe intracranial atherosclerotic disease, similar to previous. Notable findings include severe supraclinoid right ICA stenosis, mild-to-moderate M1 stenoses, with severe bilateral P2 stenoses. Extensive distal small vessel atheromatous irregularity throughout the intracranial circulation. 2. 9 mm focal outpouching at the petrous left ICA, suspicious for fusiform aneurysm, similar to previous. Electronically Signed   By: Jeannine Boga M.D.   On: 03/24/2018 06:20   Dg Chest Portable 1 View  Result Date: 03/23/2018 CLINICAL  DATA:  Patient here with spouse who report that patient developed increased weakness and dizziness around 11am today. States that she went to [facility] first and was sent to ED for further evaluation. EXAM: PORTABLE CHEST 1 VIEW COMPARISON:  05/26/2009 FINDINGS: The cardiac silhouette is normal in size. No mediastinal or hilar masses. No evidence of adenopathy. Clear lungs.  No pleural effusion or pneumothorax. Skeletal structures are grossly intact. IMPRESSION: No active disease. Electronically Signed   By: Lajean Manes M.D.   On: 03/23/2018 18:44    Micro Results    Recent Results (from the past 240 hour(s))  Urine culture     Status: Abnormal   Collection Time: 03/23/18  7:12 PM  Result Value Ref Range Status   Specimen Description URINE, RANDOM  Final  Special Requests   Final    NONE Performed at [facility], [place] [address], [place], [place] [address]    Culture >=100,000 COLONIES/mL PSEUDOMONAS AERUGINOSA (A)  Final   Report Status 03/26/2018 FINAL  Final   Organism ID, Bacteria PSEUDOMONAS AERUGINOSA (A)  Final      Susceptibility   Pseudomonas aeruginosa - MIC*    CEFTAZIDIME 4 SENSITIVE Sensitive     CIPROFLOXACIN <=0.25 SENSITIVE Sensitive     GENTAMICIN <=1 SENSITIVE Sensitive     IMIPENEM 1 SENSITIVE Sensitive     PIP/TAZO 8 SENSITIVE Sensitive     CEFEPIME 2 SENSITIVE Sensitive     * >=100,000 COLONIES/mL PSEUDOMONAS AERUGINOSA  Blood culture (routine x 2)     Status: None (Preliminary result)   Collection Time: 03/23/18  7:32 PM  Result Value Ref Range Status   Specimen Description BLOOD RIGHT ANTECUBITAL  Final   Special Requests   Final    BOTTLES DRAWN AEROBIC AND ANAEROBIC Blood Culture results may not be optimal due to an inadequate volume of blood received in culture bottles   Culture   Final    NO GROWTH 3 DAYS Performed at [facility], [place] [address], [place], [place] [address]    Report Status PENDING  Incomplete  Respiratory Panel by PCR      Status: None   Collection Time: 03/23/18  7:50 PM  Result Value Ref Range Status   Adenovirus NOT DETECTED NOT DETECTED Final   Coronavirus 229E NOT DETECTED NOT DETECTED Final   Coronavirus HKU1 NOT DETECTED NOT DETECTED Final   Coronavirus NL63 NOT DETECTED NOT DETECTED Final   Coronavirus OC43 NOT DETECTED NOT DETECTED Final   Metapneumovirus NOT DETECTED NOT DETECTED Final   Rhinovirus / Enterovirus NOT DETECTED NOT DETECTED Final   Influenza A NOT DETECTED NOT DETECTED Final   Influenza B NOT DETECTED NOT DETECTED Final   Parainfluenza Virus 1 NOT DETECTED NOT DETECTED Final   Parainfluenza Virus 2 NOT DETECTED NOT DETECTED Final   Parainfluenza Virus 3 NOT DETECTED NOT DETECTED Final   Parainfluenza Virus 4 NOT DETECTED NOT DETECTED Final   Respiratory Syncytial Virus NOT DETECTED NOT DETECTED Final   Bordetella pertussis NOT DETECTED NOT DETECTED Final   Chlamydophila pneumoniae NOT DETECTED NOT DETECTED Final   Mycoplasma pneumoniae NOT DETECTED NOT DETECTED Final  CSF culture     Status: None (Preliminary result)   Collection Time: 03/23/18  9:46 PM  Result Value Ref Range Status   Specimen Description BACK  Final   Special Requests NONE  Final   Gram Stain   Final    CYTOSPIN SMEAR WBC PRESENT, PREDOMINANTLY MONONUCLEAR NO ORGANISMS SEEN    Culture   Final    NO GROWTH 3 DAYS Performed at [facility], [place] [address], [place], [place] [address]    Report Status PENDING  Incomplete  Blood culture (routine x 2)     Status: None (Preliminary result)   Collection Time: 03/23/18 11:39 PM  Result Value Ref Range Status   Specimen Description BLOOD LEFT FOREARM  Final   Special Requests   Final    BOTTLES DRAWN AEROBIC AND ANAEROBIC Blood Culture results may not be optimal due to an inadequate volume of blood received in culture bottles   Culture   Final    NO GROWTH 2 DAYS Performed at [facility], [place] [address], [place], [place] [address]    Report  Status  PENDING  Incomplete       Today   Subjective:   [name] today has no headache,no chest abdominal pain,no new weakness tingling or numbness, feels much better wants to go home today.   Objective:   Blood pressure 136/83, pulse 77, temperature 97.9 F (36.6 C), temperature source Oral, resp. rate 19, SpO2 99 %.   Intake/Output Summary (Last 24 hours) at 03/26/2018 1156 Last data filed at 03/26/2018 0326 Gross per 24 hour  Intake 581.45 ml  Output 426 ml  Net 155.45 ml    Exam Awake Alert, Oriented x 2, No new F.N deficits, Normal affect, he is more appropriate today Symmetrical Chest wall movement, Good air movement bilaterally, CTAB RRR,No Gallops,Rubs or new Murmurs, No Parasternal Heave +ve B.Sounds, Abd Soft, Non tender,  No rebound -guarding or rigidity. Right lower extremity with no wounds, with trans metatarsal amputation, left lower extremity with BKA  Data Review   CBC w Diff:  Lab Results  Component Value Date   WBC 13.2 (H) 03/25/2018   HGB 10.7 (L) 03/25/2018   HGB 9.5 (L) 05/15/2016   HCT 33.8 (L) 03/25/2018   HCT 29.9 (L) 05/15/2016   PLT 159 03/25/2018   PLT 182 05/15/2016   LYMPHOPCT 1 03/23/2018   MONOPCT 7 03/23/2018   EOSPCT 0 03/23/2018   BASOPCT 0 03/23/2018    CMP:  Lab Results  Component Value Date   NA 140 03/25/2018   NA 139 05/15/2016   K 4.5 03/25/2018   CL 111 03/25/2018   CO2 22 03/25/2018   BUN 48 (H) 03/25/2018   BUN 48 (H) 05/15/2016   CREATININE 3.09 (H) 03/25/2018   CREATININE 2.29 (H) 04/23/2013   PROT 5.2 (L) 03/24/2018   PROT 6.7 05/15/2016   ALBUMIN 2.4 (L) 03/24/2018   ALBUMIN 3.9 05/15/2016   BILITOT 0.7 03/24/2018   BILITOT 0.3 05/15/2016   ALKPHOS 42 03/24/2018   AST 16 03/24/2018   ALT 14 03/24/2018  .   Total Time in preparing paper work, data evaluation and todays exam - 6 minutes  Phillips Climes M.D on 03/26/2018 at 11:56 AM  [facility]   Office  [phone]

## 2018-03-27 LAB — CSF CULTURE W GRAM STAIN

## 2018-03-27 LAB — CSF CULTURE: CULTURE: NO GROWTH

## 2018-03-28 ENCOUNTER — Other Ambulatory Visit: Payer: Self-pay | Admitting: *Deleted

## 2018-03-28 DIAGNOSIS — R269 Unspecified abnormalities of gait and mobility: Secondary | ICD-10-CM | POA: Diagnosis not present

## 2018-03-28 DIAGNOSIS — I130 Hypertensive heart and chronic kidney disease with heart failure and stage 1 through stage 4 chronic kidney disease, or unspecified chronic kidney disease: Secondary | ICD-10-CM | POA: Diagnosis not present

## 2018-03-28 DIAGNOSIS — M150 Primary generalized (osteo)arthritis: Secondary | ICD-10-CM | POA: Diagnosis not present

## 2018-03-28 DIAGNOSIS — I69398 Other sequelae of cerebral infarction: Secondary | ICD-10-CM | POA: Diagnosis not present

## 2018-03-28 DIAGNOSIS — I5042 Chronic combined systolic (congestive) and diastolic (congestive) heart failure: Secondary | ICD-10-CM | POA: Diagnosis not present

## 2018-03-28 LAB — CULTURE, BLOOD (ROUTINE X 2): Culture: NO GROWTH

## 2018-03-28 NOTE — Patient Outreach (Signed)
[name]) Care Management  03/28/2018  [name]. 1945-12-21 [id number]   Referral received from hospital liaison as member was recently discharged from hospital, diagnosed with acute encephalopathy and fever of unspecified cause.  Per chart, he also has history of hypertension, heart failure, CVA, diabetes (last A1C - 6.1), left BKA, seizure disorder, chronic kidney disease, and BPH.  Call placed to member to initiate transition of care program, wife [name] answered phone stating member was unavailable.  Noted DPR on file giving permission to speak with wife.  Member's identity verified, this care manager introduced self and purpose of call.  [facility] care management services explained, already active with BSW.    Wife report member had follow up appointment today with primary MD but it was canceled due to member having diarrhea.  She has already notified office and is waiting for a call back for further instructions as well as new appointment.  She state he was recently diagnosed with UTI, placed on Cipro. [name] made initial visit today, all home health services (RN/PT/OT/Aide) will be started by Monday.    Report member has restarted checking blood sugars, today readings in the 100s, typically controlled.  Medications reviewed, wife manages all medication and administration.  She also manages his diet, adheres to low sodium/renal with about 32 oz daily fluid restriction.  Member has appointment with kidney specialist on 1/23, plan of care will be discussed.  He is not a candidate for hemodialysis at this time as wife report they attempted to place fistula without success.  She does not think he is candidate to be considered for kidney transplant due to his current weakened state.  She feel management of his kidney condition is most important at this time but will not have further information until after MD appointment.    Denies any urgent concerns at this time, agrees to home  visit next week for home assessment of further needs.  Advised to contact this care manager with questions, provided with contact information.   THN CM Care Plan Problem One     Most Recent Value  Care Plan Problem One  Risk for hospitalization related to confusion/encephalopathy as evidenced by recent admission  Role Documenting the Problem One  Care Management [place] for Problem One  Active  [facility] Long Term Goal   Member will not be readmitted to hospital within the next 31 days  THN Long Term Goal Start Date  03/28/18  Interventions for Problem One Long Term Goal  Discussed with member the importance of following discharge instructions, including follow up appointments, medications, diet, and home health involvement, to decrease the risk of readmission  THN CM Short Term Goal #1   Member will have follow up appointment rescheduled with primary MD within the next 2 weeks  THN CM Short Term Goal #1 Start Date  03/28/18  Interventions for Short Term Goal #1  Wife advised of importance of follow up with MD office.  Advised to contact them again if no call back by the end of the business day.    THN CM Short Term Goal #2   Member will take medications as prescribed over the next 4 weeks  THN CM Short Term Goal #2 Start Date  03/28/18  Interventions for Short Term Goal #2  Medications reviewed, including completing entire course of antibiotics     Devin Demarquis, RN, MSN [place] Manager [phone]

## 2018-03-29 ENCOUNTER — Other Ambulatory Visit: Payer: Self-pay

## 2018-03-29 DIAGNOSIS — N184 Chronic kidney disease, stage 4 (severe): Secondary | ICD-10-CM | POA: Diagnosis not present

## 2018-03-29 DIAGNOSIS — N390 Urinary tract infection, site not specified: Secondary | ICD-10-CM | POA: Diagnosis not present

## 2018-03-29 DIAGNOSIS — K319 Disease of stomach and duodenum, unspecified: Secondary | ICD-10-CM | POA: Diagnosis not present

## 2018-03-29 DIAGNOSIS — T50905A Adverse effect of unspecified drugs, medicaments and biological substances, initial encounter: Secondary | ICD-10-CM | POA: Diagnosis not present

## 2018-03-29 LAB — CULTURE, BLOOD (ROUTINE X 2): Culture: NO GROWTH

## 2018-03-29 NOTE — Patient Outreach (Signed)
[facility]) Care Management  03/29/2018  [name]. 08-Aug-1945 [id number]   Follow up call to patient's wife, [name], regarding referrals to [place], [place] for home modifications.  [name] has received paperwork from [facility] [place].  [name] was hospitalized shortly after receiving the paperwork so she has been unable to complete it.  She is hoping to get it completed within the next week.  She denied being contacted by [employer].  BSW provided her with contact name, [name], as [name] reported that she does not answer the phone if she does not recognize the number calling.  She wrote down [name]'s name and said that she will listen out for a call from her.  BSW will reach out to [facility] regarding status of referral.  BSW will follow up with referral sources and [name] again next month regarding status of referrals.    Ronn Melena, BSW Social Worker [phone]

## 2018-04-01 ENCOUNTER — Other Ambulatory Visit: Payer: Self-pay | Admitting: *Deleted

## 2018-04-01 ENCOUNTER — Encounter: Payer: Self-pay | Admitting: *Deleted

## 2018-04-01 DIAGNOSIS — N390 Urinary tract infection, site not specified: Secondary | ICD-10-CM | POA: Diagnosis not present

## 2018-04-01 DIAGNOSIS — I69398 Other sequelae of cerebral infarction: Secondary | ICD-10-CM | POA: Diagnosis not present

## 2018-04-01 DIAGNOSIS — M150 Primary generalized (osteo)arthritis: Secondary | ICD-10-CM | POA: Diagnosis not present

## 2018-04-01 DIAGNOSIS — I130 Hypertensive heart and chronic kidney disease with heart failure and stage 1 through stage 4 chronic kidney disease, or unspecified chronic kidney disease: Secondary | ICD-10-CM | POA: Diagnosis not present

## 2018-04-01 DIAGNOSIS — I5042 Chronic combined systolic (congestive) and diastolic (congestive) heart failure: Secondary | ICD-10-CM | POA: Diagnosis not present

## 2018-04-01 DIAGNOSIS — R269 Unspecified abnormalities of gait and mobility: Secondary | ICD-10-CM | POA: Diagnosis not present

## 2018-04-01 NOTE — Patient Outreach (Signed)
[facility]) Care Management   04/01/2018  [name]. October 26, 1945 [id number]  [name]. is an 73 y.o. male  Subjective:   Member alert and oriented x3, denies any pain or discomfort at this time.  Report decrease in diarrhea since MD stopped Cipro.  Went for labs this morning, awaiting results to see if UTI has cleared.  Report compliance with medications per wife.  Objective:   Review of Systems  Constitutional: Negative.   HENT: Negative.   Eyes: Negative.   Respiratory: Negative.   Cardiovascular: Negative.   Gastrointestinal: Negative.   Genitourinary: Negative.   Musculoskeletal: Positive for falls.  Skin: Negative.   Neurological: Positive for weakness.  Endo/Heme/Allergies: Negative.   Psychiatric/Behavioral: Positive for depression.    Physical Exam  Constitutional: He is oriented to person, place, and time.  Neck: Normal range of motion.  Cardiovascular: Normal rate, regular rhythm and normal heart sounds.  Respiratory: Effort normal and breath sounds normal.  GI: Soft. Bowel sounds are normal.  Neurological: He is alert and oriented to person, place, and time.  Skin: Skin is warm and dry.   BP 140/68 (BP Location: Left Arm, Patient Position: Sitting, Cuff Size: Normal)   Pulse 66   Resp 18   Ht 1.778 m ('5\' 10"' )   Wt 140 lb (63.5 kg)   BMI 20.09 kg/m   Encounter Medications:   Outpatient Encounter Medications as of 04/01/2018  Medication Sig Note  . acetaminophen (TYLENOL) 500 MG tablet Take 500 mg by mouth 2 (two) times daily as needed for moderate pain or headache.   . ASPERCREME LIDOCAINE EX Apply 1 application topically daily as needed (for pain).    [name] atorvastatin (LIPITOR) 20 MG tablet Take 20 mg by mouth every evening.    [name] BIDIL 20-37.5 MG tablet TAKE 1 TABLET BY MOUTH TWICE DAILY (Patient taking differently: Take 1 tablet by mouth 3 (three) times daily. ) 04/01/2018: Twice a day  . carvedilol (COREG) 6.25 MG tablet  Take 1 tablet (6.25 mg total) by mouth 2 (two) times daily with a meal.   . clopidogrel (PLAVIX) 75 MG tablet Take 1 tablet (75 mg total) by mouth daily. May restart in  Two days. (Patient taking differently: Take 75 mg by mouth daily. )   . Continuous Blood Gluc Sensor (FREESTYLE LIBRE 14 DAY SENSOR) MISC 1 patch every 14 (fourteen) days.   [name] docusate sodium (COLACE) 100 MG capsule Take 1 capsule (100 mg total) by mouth 2 (two) times daily.   . finasteride (PROSCAR) 5 MG tablet Take 5 mg by mouth daily.    [name] gabapentin (NEURONTIN) 100 MG capsule Take 300 mg by mouth at bedtime.    . insulin detemir (LEVEMIR) 100 UNIT/ML injection Inject 4 Units into the skin at bedtime.   . insulin lispro (HUMALOG) 100 UNIT/ML injection Inject 0-6 Units into the skin 3 (three) times daily before meals. Sliding scale   . lamoTRIgine (LAMICTAL) 100 MG tablet Take 1 tablet (100 mg total) by mouth 2 (two) times daily.   . memantine (NAMENDA) 10 MG tablet TAKE 1 TABLET BY MOUTH TWO  TIMES DAILY (Patient taking differently: Take 10 mg by mouth 2 (two) times daily. )   . Multiple Vitamins-Minerals (CENTRUM SILVER ADULT 50+ PO) Take 1 tablet by mouth daily.    [name] oxybutynin (DITROPAN-XL) 5 MG 24 hr tablet Take 5 mg by mouth at bedtime.   . polyethylene glycol (MIRALAX / GLYCOLAX) packet Take  17 g by mouth daily as needed for mild constipation.   . silver sulfADIAZINE (SILVADENE) 1 % cream Apply 1 application topically daily as needed (sores).    . sodium bicarbonate 650 MG tablet Take 650 mg by mouth 3 (three) times daily.   . tamsulosin (FLOMAX) 0.4 MG CAPS capsule Take 1 capsule (0.4 mg total) by mouth daily after supper.   . timolol (BETIMOL) 0.5 % ophthalmic solution Place 1 drop into both eyes 2 (two) times daily.    . Travoprost, BAK Free, ([name]) 0.004 % SOLN ophthalmic solution Place 1 drop into both eyes at bedtime.   [name] venlafaxine XR (EFFEXOR-XR) 75 MG 24 hr capsule Take 75 mg by mouth 2 (two) times daily.     . vitamin C (ASCORBIC ACID) 500 MG tablet Take 500 mg by mouth daily.   . ciprofloxacin (CIPRO) 500 MG tablet Take 1 tablet (500 mg total) by mouth daily with breakfast. (Patient not taking: Reported on 04/01/2018)   . feeding supplement, GLUCERNA SHAKE, (GLUCERNA SHAKE) LIQD Take 237 mLs by mouth 3 (three) times daily between meals. (Patient not taking: Reported on 03/28/2018)   . furosemide (LASIX) 40 MG tablet Take 1 tablet (40 mg total) by mouth daily for 4 days. Increase to 80 mg if excess fluid Hold for next 3 days, resume on 03/30/2018    No facility-administered encounter medications on file as of 04/01/2018.     Functional Status:   In your present state of health, do you have any difficulty performing the following activities: 04/01/2018 03/24/2018  Hearing? N N  Vision? N N  Difficulty concentrating or making decisions? Y Y  Comment - -  Walking or climbing stairs? Y Y  Dressing or bathing? Y Y  Doing errands, shopping? Y N  Preparing Food and eating ? Y -  Using the Toilet? Y -  In the past six months, have you accidently leaked urine? Y -  Do you have problems with loss of bowel control? Y -  Managing your Medications? Y -  Managing your Finances? Y -  Housekeeping or managing your Housekeeping? Y -  Some recent data might be hidden    Fall/Depression Screening:    Fall Risk  03/28/2018 02/22/2018 06/18/2013  Falls in the past year? 1 1 No  Number falls in past yr: 1 1 -  Injury with Fall? 1 1 -  Comment - Patient hit his head,  he was taken to the hospital for evaluation and no injury -  Risk for fall due to : Impaired balance/gait;History of fall(s);Impaired mobility History of fall(s);Impaired balance/gait Impaired mobility  Follow up Falls prevention discussed Falls evaluation completed;Education provided;Falls prevention discussed -   PHQ 2/9 Scores 04/01/2018 02/21/2018 06/18/2013 06/18/2013 04/02/2013  PHQ - 2 Score 1 2 0 0 0  PHQ- 9 Score - 7 - - -    Assessment:    Met  with member at scheduled time, wife present during visit, consent obtained.  Member expresses some frustration regarding feelings of depression.  He state he has had a very active lifestyle until the past few years when his health began to decline.  He enjoys going out and helping others when possible.  He is on medication for depression.  Wife state her concern is his stability as he has had 19 falls in the past year.  She report he has lost weight and is concerned about his prosthetic is not fitting correctly.  He has also had 4 of  his right toes amputated, which has left a gap in his shoes, creating an additional fall risk.  Wife will speak with orthopedic MD regarding re-evaluation of prosthesis as well as bio-tech regarding fitting of shoes.    Wife also report concern for transportation.  She is typically able to provide transportation but have to lift walker/wheelchair in and out of the care.  She is not having issues with her shoulder and hip which is causing limitations to assist.  She is still able to accompany member to if she's able to secure alternate modes.    Medications reviewed.  Report getting most from [facility], but state they have been having difficulty paying for Bidil, Levemir, and eye drops at times.  She is open to hearing options from pharmacist.    Denies any further concerns at this time.  Provided with [facility] calendar and educated on 24 hour nurse triage line.  Advised to contact this care manager with questions.  Plan:   Will follow up with member/wife next week. Will place referral to [facility] pharmacist to review medications and cost. Will collaborate with BSW regarding transportation options.  THN CM Care Plan Problem One     Most Recent Value  Care Plan Problem One  Risk for hospitalization related to confusion/encephalopathy as evidenced by recent admission  Role Documenting the Problem One  Care Management [place] for Problem One  Active  [facility] Long Term  Goal   Member will not be readmitted to hospital within the next 31 days  THN Long Term Goal Start Date  03/28/18  Interventions for Problem One Long Term Goal  Home assessment and evaluation compete.  Discussed with wife and member interventions to prevent falls such as re-evaluating the fit of his prothesis.  THN CM Short Term Goal #1   Member will have follow up appointment rescheduled with primary MD within the next 2 weeks  THN CM Short Term Goal #1 Start Date  03/28/18  Interventions for Short Term Goal #1  Discussed importance of follow up visit.  Will await lab results and schedule appointment at that time  [facility] CM Short Term Goal #2   Member will take medications as prescribed over the next 4 weeks  THN CM Short Term Goal #2 Start Date  03/28/18  Interventions for Short Term Goal #2  Medications reviewed in the home.  Provided with new pill box, referral placed to pharmacist due to number of medications and concern for cost.     Valente Oneil, RN, MSN [place] Manager [phone]

## 2018-04-02 ENCOUNTER — Other Ambulatory Visit: Payer: Self-pay | Admitting: Pharmacy Technician

## 2018-04-02 ENCOUNTER — Telehealth: Payer: Self-pay | Admitting: Pharmacist

## 2018-04-02 ENCOUNTER — Other Ambulatory Visit: Payer: Medicare Other | Admitting: Internal Medicine

## 2018-04-02 ENCOUNTER — Other Ambulatory Visit: Payer: Self-pay

## 2018-04-02 NOTE — Patient Outreach (Addendum)
[facility]) Care Management  [place]   04/02/2018  [name] 03-30-1945 [id number]  Reason for referral: Medication Assistance with Bidil, eye drops and insulin  Referral source: [facility] RN Current insurance:[employer]  PMHx includes but not limited to:  T2DM, CKD-IV, HTN, memory disorder / dementia, seizures, BPH, systolic and diastolic HFrEF, CVA, hx left BKA, noted history of 10 ED visits and admissions within last 6 months, most recently hospitalized 1/4- 03/26/2018 for sepsis / acute encephalopathy due to pseudomonas UTI, discharged on oral ciprofloxacin.   Outreach:  Successful telephone call with patient and spouse.  HIPAA identifiers verified. Noted spouse already given consent to speak with [facility].   Subjective:  Spouse reports she assists patient with organizing medications and does not need assistance with compliance aides.  She reports that several medications are expensive and would like assistance with any possible patient assistance.    Objective: Lab Results  Component Value Date   CREATININE 3.09 (H) 03/25/2018   CREATININE 3.02 (H) 03/24/2018   CREATININE 3.17 (H) 03/23/2018    Lab Results  Component Value Date   HGBA1C 5.6 08/10/2017    Lipid Panel     Component Value Date/Time   CHOL 181 10/29/2016 0930   TRIG 145 10/29/2016 0930   HDL 58 10/29/2016 0930   CHOLHDL 3.1 10/29/2016 0930   VLDL 29 10/29/2016 0930   LDLCALC 94 10/29/2016 0930    BP Readings from Last 3 Encounters:  04/01/18 140/68  03/26/18 127/64  03/21/18 136/72    Allergies  Allergen Reactions  . Codeine Anaphylaxis  . Penicillins Anaphylaxis, Swelling, Rash and Other (See Comments)    PATIENT HAD A PCN REACTION WITH IMMEDIATE RASH, FACIAL/TONGUE/THROAT SWELLING, SOB, OR LIGHTHEADEDNESS WITH HYPOTENSION:  #  #  #  YES  #  #  #  Has patient had a PCN reaction causing severe rash involving mucus membranes or skin necrosis: No Has patient had a  PCN reaction that required hospitalization: Unknown Has patient had a PCN reaction occurring within the last 10 years: No If all of the above answers are "NO", then may proceed with Cephalosporin use.     Medications Reviewed Today    Reviewed by Valente Francesco, RN (Registered Nurse) on 04/01/18 at 1139  Med List Status: <None>  Medication Order Taking? Sig Documenting Provider Last Dose Status Informant  acetaminophen (TYLENOL) 500 MG tablet [id number] Yes Take 500 mg by mouth 2 (two) times daily as needed for moderate pain or headache. [provider] Taking Active Spouse/Significant Other  ASPERCREME LIDOCAINE EX [id number] Yes Apply 1 application topically daily as needed (for pain).  [provider] Taking Active Spouse/Significant Other  atorvastatin (LIPITOR) 20 MG tablet [id number] Yes Take 20 mg by mouth every evening.  [provider] Taking Active Spouse/Significant Other  BIDIL 20-37.5 MG tablet [id number] Yes TAKE 1 TABLET BY MOUTH TWICE DAILY  Patient taking differently:  Take 1 tablet by mouth 3 (three) times daily.    Nahser, Wonda Cheng, MD Taking Active Spouse/Significant Other           Med Note Orene Desanctis, MONICA   Mon Apr 01, 2018 11:36 AM) Twice a day  carvedilol (COREG) 6.25 MG tablet [id number] Yes Take 1 tablet (6.25 mg total) by mouth 2 (two) times daily with a meal. Regalado, Belkys A, MD Taking Active Spouse/Significant Other  ciprofloxacin (CIPRO) 500 MG tablet [id number] No Take 1 tablet (500 mg total) by mouth daily with  breakfast.  Patient not taking:  Reported on 04/01/2018   Elgergawy, Silver Huguenin, MD Not Taking Active   clopidogrel (PLAVIX) 75 MG tablet [id number] Yes Take 1 tablet (75 mg total) by mouth daily. May restart in  Two days.  Patient taking differently:  Take 75 mg by mouth daily.    Judeth Horn, MD Taking Active Spouse/Significant Other  Continuous Blood Gluc Sensor (FREESTYLE LIBRE [place]) [place] [id number] Yes 1 patch every 14  (fourteen) days. [provider] Taking Active Spouse/Significant Other  docusate sodium (COLACE) 100 MG capsule [id number] Yes Take 1 capsule (100 mg total) by mouth 2 (two) times daily. Donne Hazel, MD Taking Active Spouse/Significant Other  feeding supplement, GLUCERNA SHAKE, (GLUCERNA SHAKE) LIQD [id number] No Take 237 mLs by mouth 3 (three) times daily between meals.  Patient not taking:  Reported on 03/28/2018   Elgergawy, Silver Huguenin, MD Not Taking Active   finasteride (PROSCAR) 5 MG tablet [id number] Yes Take 5 mg by mouth daily.  [provider] Taking Active Spouse/Significant Other  furosemide (LASIX) 40 MG tablet [id number]  Take 1 tablet (40 mg total) by mouth daily for 4 days. Increase to 80 mg if excess fluid Hold for next 3 days, resume on 03/30/2018 Elgergawy, Silver Huguenin, MD  Expired 03/30/18 2359   gabapentin (NEURONTIN) 100 MG capsule [id number] Yes Take 300 mg by mouth at bedtime.  [provider] Taking Active Spouse/Significant Other  insulin detemir (LEVEMIR) 100 UNIT/ML injection [id number] Yes Inject 4 Units into the skin at bedtime. [provider] Taking Active Spouse/Significant Other  insulin lispro (HUMALOG) 100 UNIT/ML injection [id number] Yes Inject 0-6 Units into the skin 3 (three) times daily before meals. Sliding scale [provider] Taking Active Spouse/Significant Other           Med Note Leanord Asal, Darrol Jump   Fri Feb 01, 2018  9:50 AM)    lamoTRIgine (LAMICTAL) 100 MG tablet [id number] Yes Take 1 tablet (100 mg total) by mouth 2 (two) times daily. Kathrynn Ducking, MD Taking Active Spouse/Significant Other  memantine (NAMENDA) 10 MG tablet [id number] Yes TAKE 1 TABLET BY MOUTH TWO  TIMES DAILY  Patient taking differently:  Take 10 mg by mouth 2 (two) times daily.    Kathrynn Ducking, MD Taking Active Spouse/Significant Other  Multiple Vitamins-Minerals (CENTRUM SILVER ADULT 50+ PO) [id number] Yes Take 1 tablet by mouth daily.   [provider] Taking Active Spouse/Significant Other  oxybutynin (DITROPAN-XL) 5 MG 24 hr tablet [id number] Yes Take 5 mg by mouth at bedtime. [provider] Taking Active Spouse/Significant Other  polyethylene glycol (MIRALAX / GLYCOLAX) packet [id number] Yes Take 17 g by mouth daily as needed for mild constipation. [provider] Taking Active Spouse/Significant Other  silver sulfADIAZINE (SILVADENE) 1 % cream [id number] Yes Apply 1 application topically daily as needed (sores).  [provider] Taking Active Spouse/Significant Other           Med Note Harrel Lemon, [facility] T   Tue May 09, 2016  8:42 AM)    sodium bicarbonate 650 MG tablet [id number] Yes Take 650 mg by mouth 3 (three) times daily. [provider] Taking Active Spouse/Significant Other  tamsulosin (FLOMAX) 0.4 MG CAPS capsule [id number] Yes Take 1 capsule (0.4 mg total) by mouth daily after supper. Rai, Vernelle Emerald, MD Taking Active Spouse/Significant Other  timolol (BETIMOL) 0.5 % ophthalmic solution [id number] Yes Place 1 drop into both eyes 2 (two) times daily.  [provider] Taking  Active Spouse/Significant Other  Travoprost, BAK Free, ([name]) 0.004 % SOLN ophthalmic solution [id number] Yes Place 1 drop into both eyes at bedtime. [provider] Taking Active Spouse/Significant Other  venlafaxine XR (EFFEXOR-XR) 75 MG 24 hr capsule [id number] Yes Take 75 mg by mouth 2 (two) times daily.  [provider] Taking Active Spouse/Significant Other           Med Note Rosemarie Beath, MELISSA B   Wed Jun 02, 2015  3:35 PM)    vitamin C (ASCORBIC ACID) 500 MG tablet [id number] Yes Take 500 mg by mouth daily. [provider] Taking Active Spouse/Significant Other         ASSESSMENT: Date Discharged from Hospital: 03/26/2018 Date Medication Reconciliation Performed: 04/02/2018  Medications:  New at Discharge: . Ciprofloxacin . Feeding supplement  Adjustments at  Discharge: . Furosemide  Discontinued at Discharge:   None  Patient was recently discharged from hospital and all medications have been reviewed.  Drugs sorted by system:  Neurologic/Psychologic:gabapentin, lamotrigine, memantine, venlafaxine XR  Cardiovascular: atorvastatin, hydralazine-isosorbide, carvedilol, clopidogrel, furosemide  Gastrointestinal: docusate, polyethylene glycol  Endocrine:insulin detemir, insulin lispro  Topical:aspercreme, silver sulfadiazine, timolol, travoprost  Pain: acetaminophen  Genitourinary:finasteride, oxybutynin, tamsulosin  Vitamins/Minerals/Supplements:MVI, sodium bicarbonate, vitamin C   Medication Review Findings:   Bidil: patient taking BID per provider instructions (verbal report from spouse), usual dosing is TID  Completed only 2 of 5 days of PO ciprofloxacin outpatient due to GI side effects.   Medication Assistance Findings:  Medication assistance needs identified.   Extra Help:   '[]'  Already receiving Full Extra Help  '[]'  Already receiving Partial Extra Help  '[]'  Eligible based on reported income and assets  '[x]'  Not Eligible based on reported income and assets  Patient Assistance Programs: 1) Levemir made by [employer] o Income requirement met: '[x]'  Yes '[]'  No '[]'  Unknown o Out-of-pocket prescription expenditure met:    '[]'  Yes '[x]'  No  '[]'  Unknown  '[]'  Not applicable - Alternative option is to [employer] made by [employer] as no out-of-pocket expenditure requirement.         2)  Humalog made by Waynetta [employer] o Income requirement met: '[x]'  Yes '[]'  No  '[]'  Unknown o Out-of-pocket prescription expenditure met:   '[]'  Yes '[]'  No   '[]'  Unknown '[x]'  Not applicable - Patient has met application requirements to apply for this patient assistance program.            3)  Bidil made by [employer] o Income requirement met: '[x]'  Yes '[]'  No  '[]'  Unknown o Out-of-pocket prescription expenditure met:   '[]'  Yes '[]'  No   '[]'  Unknown '[x]'  Not  applicable - Application also requires letter of denial from [employer].  Spouse reports she may have this document and will look for it.          4)  [name] made by [employer] o Income requirement met: '[x]'  Yes '[]'  No  '[]'  Unknown o Out-of-pocket prescription expenditure met:   '[]'  Yes '[]'  No   '[]'  Unknown '[x]'  Not applicable - Patient has met application requirements to apply for this patient assistance program.    Additional medication assistance options reviewed with patient as warranted:  Insurance OTC catalogue  Plan: I will route patient assistance letter to [place] technician who will coordinate patient assistance program application process for medications listed above.  [facility] pharmacy technician will assist with obtaining all required documents from both patient and provider(s) and submit application(s) once completed.  Will contact endocrinologist regarding possible change from Levemir to [facility] for patient assistance application.    Will route note regarding early discontinuation by patient of Ciprofloxacin for pseudomonas UTI (pansensitive) to PCP.   Ralene Bathe, PharmD, [place] [phone]

## 2018-04-02 NOTE — Patient Outreach (Signed)
[facility]) Care Management  04/02/2018  [name]. [birth year] [id number]   BSW messaged [name] with [employer] regarding status of referral for home modifications submitted on 02/25/18.  Ronn Melena, BSW Social Worker [phone]

## 2018-04-02 NOTE — Patient Outreach (Signed)
[facility]) Care Management  04/02/2018  [name] 1946/01/11 [id number]                                                  Medication Assistance Referral  Referral From: [place]  Medication/Company: Humalog and Basaglar / [name] Patient application portion:  Education officer, museum portion: Faxed  to Dr. Garnet Koyanagi  Medication/Company: [name] / [name] Patient application portion:  Education officer, museum portion: Faxed  to [employer] when app is returned from patient   Medication/Company: [name] / [name] Patient application portion:  Education officer, museum portion: Faxed  to Dr. Satira Sark  Follow up:  Will follow up with patient in 5-7 business days to confirm application(s) have been received.  [name] Certified Pharmacy Technician [place] Management Direct Dial:[phone]

## 2018-04-03 ENCOUNTER — Other Ambulatory Visit: Payer: Self-pay

## 2018-04-03 ENCOUNTER — Ambulatory Visit (HOSPITAL_COMMUNITY)
Admission: RE | Admit: 2018-04-03 | Discharge: 2018-04-03 | Disposition: A | Discharge status: Home / Self Care | Payer: Medicare Other | Source: Ambulatory Visit | Attending: Nephrology | Admitting: Nephrology

## 2018-04-03 ENCOUNTER — Ambulatory Visit: Payer: Self-pay

## 2018-04-03 VITALS — BP 128/61 | HR 64 | Temp 98.1°F | Resp 20

## 2018-04-03 DIAGNOSIS — M150 Primary generalized (osteo)arthritis: Secondary | ICD-10-CM | POA: Diagnosis not present

## 2018-04-03 DIAGNOSIS — I69398 Other sequelae of cerebral infarction: Secondary | ICD-10-CM | POA: Diagnosis not present

## 2018-04-03 DIAGNOSIS — N184 Chronic kidney disease, stage 4 (severe): Secondary | ICD-10-CM | POA: Diagnosis not present

## 2018-04-03 DIAGNOSIS — I5042 Chronic combined systolic (congestive) and diastolic (congestive) heart failure: Secondary | ICD-10-CM | POA: Diagnosis not present

## 2018-04-03 DIAGNOSIS — R269 Unspecified abnormalities of gait and mobility: Secondary | ICD-10-CM | POA: Diagnosis not present

## 2018-04-03 DIAGNOSIS — I130 Hypertensive heart and chronic kidney disease with heart failure and stage 1 through stage 4 chronic kidney disease, or unspecified chronic kidney disease: Secondary | ICD-10-CM | POA: Diagnosis not present

## 2018-04-03 LAB — FERRITIN: Ferritin: 134 ng/mL (ref 24–336)

## 2018-04-03 LAB — IRON AND TIBC
Iron: 78 ug/dL (ref 45–182)
Saturation Ratios: 35 % (ref 17.9–39.5)
TIBC: 221 ug/dL — ABNORMAL LOW (ref 250–450)
UIBC: 143 ug/dL

## 2018-04-03 LAB — POCT HEMOGLOBIN-HEMACUE: Hemoglobin: 9.6 g/dL — ABNORMAL LOW (ref 13.0–17.0)

## 2018-04-03 MED ORDER — DARBEPOETIN ALFA 100 MCG/0.5ML IJ SOSY
PREFILLED_SYRINGE | INTRAMUSCULAR | Status: AC
  Filled 2018-04-03: qty 0.5

## 2018-04-03 MED ORDER — DARBEPOETIN ALFA 100 MCG/0.5ML IJ SOSY
100.0000 ug | PREFILLED_SYRINGE | INTRAMUSCULAR | Status: DC
  Administered 2018-04-04: 100 ug via SUBCUTANEOUS

## 2018-04-03 NOTE — Patient Outreach (Signed)
[facility]) Care Management  04/03/2018  [name]. 1945-11-28 [id number]   In-basket message received from [facility], Valente Milferd, on 04/02/18 to outreach patient's wife for completion of SCAT application.  BSW attempted to reach today.   Was unable to leave message on home number.  Voicemail message left on mobile number. BSW will attempt to reach again within four business days.    Ronn Melena, BSW Social Worker [phone]

## 2018-04-04 ENCOUNTER — Telehealth: Payer: Self-pay | Admitting: Neurology

## 2018-04-04 ENCOUNTER — Other Ambulatory Visit: Payer: Self-pay | Admitting: Pharmacist

## 2018-04-04 DIAGNOSIS — R269 Unspecified abnormalities of gait and mobility: Secondary | ICD-10-CM | POA: Diagnosis not present

## 2018-04-04 DIAGNOSIS — I130 Hypertensive heart and chronic kidney disease with heart failure and stage 1 through stage 4 chronic kidney disease, or unspecified chronic kidney disease: Secondary | ICD-10-CM | POA: Diagnosis not present

## 2018-04-04 DIAGNOSIS — I5042 Chronic combined systolic (congestive) and diastolic (congestive) heart failure: Secondary | ICD-10-CM | POA: Diagnosis not present

## 2018-04-04 DIAGNOSIS — I69398 Other sequelae of cerebral infarction: Secondary | ICD-10-CM | POA: Diagnosis not present

## 2018-04-04 DIAGNOSIS — M150 Primary generalized (osteo)arthritis: Secondary | ICD-10-CM | POA: Diagnosis not present

## 2018-04-04 NOTE — Patient Outreach (Addendum)
[facility]) Care Management  [place] 04/04/2018  [name]. 09/12/1945 [id number]  2nd phone call to follow up with Dr. Shelia Media concerning [name]' incomplete course of ciprofloxacin for Pseudomonas UTI.   Left another message for nurse requesting return call.    [place] Faster PharmD Candidate Class of 2020  [employer], PharmD, [place] [phone]

## 2018-04-04 NOTE — Telephone Encounter (Signed)
Dwyane Dee from [facility] is needing a verbal auth to see the pt 1 time for a week and 3 times for 4 weeks. Please advise.

## 2018-04-04 NOTE — Telephone Encounter (Signed)
I contacted Dwyane Dee with [facility] and provided verbal orders needed.

## 2018-04-05 ENCOUNTER — Ambulatory Visit: Payer: Self-pay | Admitting: Pharmacist

## 2018-04-05 ENCOUNTER — Other Ambulatory Visit: Payer: Self-pay

## 2018-04-05 DIAGNOSIS — I130 Hypertensive heart and chronic kidney disease with heart failure and stage 1 through stage 4 chronic kidney disease, or unspecified chronic kidney disease: Secondary | ICD-10-CM | POA: Diagnosis not present

## 2018-04-05 DIAGNOSIS — M150 Primary generalized (osteo)arthritis: Secondary | ICD-10-CM | POA: Diagnosis not present

## 2018-04-05 DIAGNOSIS — I69398 Other sequelae of cerebral infarction: Secondary | ICD-10-CM | POA: Diagnosis not present

## 2018-04-05 DIAGNOSIS — R269 Unspecified abnormalities of gait and mobility: Secondary | ICD-10-CM | POA: Diagnosis not present

## 2018-04-05 DIAGNOSIS — I5042 Chronic combined systolic (congestive) and diastolic (congestive) heart failure: Secondary | ICD-10-CM | POA: Diagnosis not present

## 2018-04-05 NOTE — Patient Outreach (Signed)
[facility]) Care Management  04/05/2018  Khalib Fendley. 08/18/1945 [id number]   Successful outreach to patient's spouse, Cael Worth, to complete SCAT application.  Application completed and faxed to SCAT eligibility.  BSW informed Ms. Bondy that she should hear from SCAT within the next week to schedule the eligibility interview.  BSW will follow up within the next two weeks to ensure they have been contacted.   Ronn Melena, BSW Social Worker [phone]

## 2018-04-08 ENCOUNTER — Ambulatory Visit: Payer: Self-pay | Admitting: Pharmacist

## 2018-04-08 ENCOUNTER — Other Ambulatory Visit: Payer: Self-pay | Admitting: *Deleted

## 2018-04-08 ENCOUNTER — Other Ambulatory Visit: Payer: Self-pay | Admitting: Pharmacist

## 2018-04-08 DIAGNOSIS — I69398 Other sequelae of cerebral infarction: Secondary | ICD-10-CM | POA: Diagnosis not present

## 2018-04-08 DIAGNOSIS — I5042 Chronic combined systolic (congestive) and diastolic (congestive) heart failure: Secondary | ICD-10-CM | POA: Diagnosis not present

## 2018-04-08 DIAGNOSIS — M150 Primary generalized (osteo)arthritis: Secondary | ICD-10-CM | POA: Diagnosis not present

## 2018-04-08 DIAGNOSIS — I130 Hypertensive heart and chronic kidney disease with heart failure and stage 1 through stage 4 chronic kidney disease, or unspecified chronic kidney disease: Secondary | ICD-10-CM | POA: Diagnosis not present

## 2018-04-08 DIAGNOSIS — R269 Unspecified abnormalities of gait and mobility: Secondary | ICD-10-CM | POA: Diagnosis not present

## 2018-04-08 NOTE — Patient Outreach (Signed)
[facility]) Care Management  04/08/2018  [name]. 07-02-1945 [id number]   Weekly transition of care call placed to [facility] wife/caregiver.  She report member is progressing well, seem to be getting stronger with PT.  Continues to have home health aide for bathing as well as OT.  Member remains compliant with medications with wife's help.  Denies any further diarrhea or signs/symptoms of infection.  Denies any urgent concerns, advised to contact this care manager with questions.  THN CM Care Plan Problem One     Most Recent Value  Care Plan Problem One  Risk for hospitalization related to confusion/encephalopathy as evidenced by recent admission  Role Documenting the Problem One  Care Management [place] for Problem One  Active  [facility] Long Term Goal   Member will not be readmitted to hospital within the next 31 days  THN Long Term Goal Start Date  03/28/18  Interventions for Problem One Long Term Goal  Confirmed with wife that she has been in contact with BSW and transportation needs assessed.   THN CM Short Term Goal #1   Member will have follow up appointment rescheduled with primary MD within the next 2 weeks  THN CM Short Term Goal #1 Start Date  03/28/18  [facility] CM Short Term Goal #1 Met Date  04/08/18  THN CM Short Term Goal #2   Member will take medications as prescribed over the next 4 weeks  THN CM Short Term Goal #2 Start Date  03/28/18  Interventions for Short Term Goal #2  Confirmed wife has been in contact with [facility] pharmacist.  Advised of importance of maintaining contact.     Valente [place], [place], MSN [place] [phone]

## 2018-04-08 NOTE — Patient Outreach (Signed)
[facility]) Care Management  [place] 04/08/2018  Bryse Blanchette. 01/02/1946 [id number]  Reason for call: f/u on incomplete course of Cipro for Pseudomonas UTI  No return call yet from Dr. Pennie Banter office regarding UTI treatment.    Successful call to Mr. Shoultz' spouse who reports patient went to Dr. Shelia Media last week for a blood draw and urine check.  She reports that all labs and urine came back normal and that patient's stools are back to baseline.  She reports she has received applications from [facility] but has not had time yet to complete them.  She is aware to look for the denial letter for [employer] for the Bidil application.    Plan: No further need to contact Dr. Shelia Media as patient was assessed after stopping antibiotic early and infection appears to have resolved.  [facility] pharmacy technician will follow-up regarding patient assistance applications.   Ralene Bathe, PharmD, [place] [phone]

## 2018-04-09 DIAGNOSIS — I130 Hypertensive heart and chronic kidney disease with heart failure and stage 1 through stage 4 chronic kidney disease, or unspecified chronic kidney disease: Secondary | ICD-10-CM | POA: Diagnosis not present

## 2018-04-09 DIAGNOSIS — R269 Unspecified abnormalities of gait and mobility: Secondary | ICD-10-CM | POA: Diagnosis not present

## 2018-04-09 DIAGNOSIS — I5042 Chronic combined systolic (congestive) and diastolic (congestive) heart failure: Secondary | ICD-10-CM | POA: Diagnosis not present

## 2018-04-09 DIAGNOSIS — I69398 Other sequelae of cerebral infarction: Secondary | ICD-10-CM | POA: Diagnosis not present

## 2018-04-09 DIAGNOSIS — M150 Primary generalized (osteo)arthritis: Secondary | ICD-10-CM | POA: Diagnosis not present

## 2018-04-10 DIAGNOSIS — I5042 Chronic combined systolic (congestive) and diastolic (congestive) heart failure: Secondary | ICD-10-CM | POA: Diagnosis not present

## 2018-04-10 DIAGNOSIS — I130 Hypertensive heart and chronic kidney disease with heart failure and stage 1 through stage 4 chronic kidney disease, or unspecified chronic kidney disease: Secondary | ICD-10-CM | POA: Diagnosis not present

## 2018-04-10 DIAGNOSIS — R269 Unspecified abnormalities of gait and mobility: Secondary | ICD-10-CM | POA: Diagnosis not present

## 2018-04-10 DIAGNOSIS — I69398 Other sequelae of cerebral infarction: Secondary | ICD-10-CM | POA: Diagnosis not present

## 2018-04-10 DIAGNOSIS — M150 Primary generalized (osteo)arthritis: Secondary | ICD-10-CM | POA: Diagnosis not present

## 2018-04-11 DIAGNOSIS — D631 Anemia in chronic kidney disease: Secondary | ICD-10-CM | POA: Diagnosis not present

## 2018-04-11 DIAGNOSIS — N390 Urinary tract infection, site not specified: Secondary | ICD-10-CM | POA: Diagnosis not present

## 2018-04-11 DIAGNOSIS — N2581 Secondary hyperparathyroidism of renal origin: Secondary | ICD-10-CM | POA: Diagnosis not present

## 2018-04-11 DIAGNOSIS — E875 Hyperkalemia: Secondary | ICD-10-CM | POA: Diagnosis not present

## 2018-04-11 DIAGNOSIS — E1122 Type 2 diabetes mellitus with diabetic chronic kidney disease: Secondary | ICD-10-CM | POA: Diagnosis not present

## 2018-04-11 DIAGNOSIS — I129 Hypertensive chronic kidney disease with stage 1 through stage 4 chronic kidney disease, or unspecified chronic kidney disease: Secondary | ICD-10-CM | POA: Diagnosis not present

## 2018-04-11 DIAGNOSIS — N184 Chronic kidney disease, stage 4 (severe): Secondary | ICD-10-CM | POA: Diagnosis not present

## 2018-04-12 ENCOUNTER — Other Ambulatory Visit: Payer: Self-pay | Admitting: Pharmacy Technician

## 2018-04-12 DIAGNOSIS — M150 Primary generalized (osteo)arthritis: Secondary | ICD-10-CM | POA: Diagnosis not present

## 2018-04-12 DIAGNOSIS — I130 Hypertensive heart and chronic kidney disease with heart failure and stage 1 through stage 4 chronic kidney disease, or unspecified chronic kidney disease: Secondary | ICD-10-CM | POA: Diagnosis not present

## 2018-04-12 DIAGNOSIS — R269 Unspecified abnormalities of gait and mobility: Secondary | ICD-10-CM | POA: Diagnosis not present

## 2018-04-12 DIAGNOSIS — I5042 Chronic combined systolic (congestive) and diastolic (congestive) heart failure: Secondary | ICD-10-CM | POA: Diagnosis not present

## 2018-04-12 DIAGNOSIS — I69398 Other sequelae of cerebral infarction: Secondary | ICD-10-CM | POA: Diagnosis not present

## 2018-04-12 NOTE — Patient Outreach (Signed)
[facility]) Care Management  04/12/2018  [name]. 1945/07/03 [id number]   Incoming call from patients wife, HIPAA identifiers verified. Mrs. Gieselman confirmed she received patient assistance applications that were mailed to them. She had questions about applications and documents that needed to be provided. Mrs. Mackowski states that she would try to have applications in the mail sometime this weekend.  Will follow up with Mrs. Massmann in 14-21 business days if documents have not been received.  Maud Deed Chana Bode [place] Certified Pharmacy Technician [place] Management Direct Dial:[phone]

## 2018-04-15 ENCOUNTER — Other Ambulatory Visit: Payer: Self-pay

## 2018-04-15 DIAGNOSIS — I130 Hypertensive heart and chronic kidney disease with heart failure and stage 1 through stage 4 chronic kidney disease, or unspecified chronic kidney disease: Secondary | ICD-10-CM | POA: Diagnosis not present

## 2018-04-15 DIAGNOSIS — R269 Unspecified abnormalities of gait and mobility: Secondary | ICD-10-CM | POA: Diagnosis not present

## 2018-04-15 DIAGNOSIS — M150 Primary generalized (osteo)arthritis: Secondary | ICD-10-CM | POA: Diagnosis not present

## 2018-04-15 DIAGNOSIS — I69398 Other sequelae of cerebral infarction: Secondary | ICD-10-CM | POA: Diagnosis not present

## 2018-04-15 DIAGNOSIS — I5042 Chronic combined systolic (congestive) and diastolic (congestive) heart failure: Secondary | ICD-10-CM | POA: Diagnosis not present

## 2018-04-15 NOTE — Patient Outreach (Signed)
[facility]) Care Management  04/15/2018  [name]. 12/17/1945 [id number]   BSW messaged [name] with [employer] regarding status of referral for home modifications submitted on 02/25/18. BSW has not received response to message sent on 04/02/18.  BSW will follow up with patient once update received   Ronn Melena, [place] Social Worker [phone]

## 2018-04-16 DIAGNOSIS — I130 Hypertensive heart and chronic kidney disease with heart failure and stage 1 through stage 4 chronic kidney disease, or unspecified chronic kidney disease: Secondary | ICD-10-CM | POA: Diagnosis not present

## 2018-04-16 DIAGNOSIS — I5042 Chronic combined systolic (congestive) and diastolic (congestive) heart failure: Secondary | ICD-10-CM | POA: Diagnosis not present

## 2018-04-16 DIAGNOSIS — R269 Unspecified abnormalities of gait and mobility: Secondary | ICD-10-CM | POA: Diagnosis not present

## 2018-04-16 DIAGNOSIS — M150 Primary generalized (osteo)arthritis: Secondary | ICD-10-CM | POA: Diagnosis not present

## 2018-04-16 DIAGNOSIS — I69398 Other sequelae of cerebral infarction: Secondary | ICD-10-CM | POA: Diagnosis not present

## 2018-04-17 ENCOUNTER — Other Ambulatory Visit: Payer: Self-pay | Admitting: *Deleted

## 2018-04-17 DIAGNOSIS — R269 Unspecified abnormalities of gait and mobility: Secondary | ICD-10-CM | POA: Diagnosis not present

## 2018-04-17 DIAGNOSIS — M150 Primary generalized (osteo)arthritis: Secondary | ICD-10-CM | POA: Diagnosis not present

## 2018-04-17 DIAGNOSIS — I130 Hypertensive heart and chronic kidney disease with heart failure and stage 1 through stage 4 chronic kidney disease, or unspecified chronic kidney disease: Secondary | ICD-10-CM | POA: Diagnosis not present

## 2018-04-17 DIAGNOSIS — I69398 Other sequelae of cerebral infarction: Secondary | ICD-10-CM | POA: Diagnosis not present

## 2018-04-17 DIAGNOSIS — I5042 Chronic combined systolic (congestive) and diastolic (congestive) heart failure: Secondary | ICD-10-CM | POA: Diagnosis not present

## 2018-04-17 NOTE — Patient Outreach (Signed)
[facility]) Care Management  04/17/2018  Abriel Geesey. [birth year] [id number]   Weekly transition of care call placed to [facility] caregiver/wife [name].  No answer, HIPAA compliant voice message left.    Update:  Call received back from wife.  She report member continues to progress.  Her concern is that he has lost so much weight that his prosthesis no longer fit and poses as safety issue when worn.  She denies contacting MD or [employer] to report concern, advised to do so.  Wife continues to provide most support to member with the help of her adult children.  Denies any urgent concerns, will follow up within the next week.  THN CM Care Plan Problem One     Most Recent Value  Care Plan Problem One  Risk for hospitalization related to confusion/encephalopathy as evidenced by recent admission  Role Documenting the Problem One  Care Management [place] for Problem One  Active  [facility] Long Term Goal   Member will not be readmitted to hospital within the next 31 days  THN Long Term Goal Start Date  03/28/18  Interventions for Problem One Long Term Goal  Wife educated on process of SCAT application and evaluation.    THN CM Short Term Goal #1   Wife will be able to verbalize plan for assessment of new prosthetic leg within the next 2 weeks  THN CM Short Term Goal #1 Start Date  04/17/18  Interventions for Short Term Goal #1  Wife educated on fall risk factors.  Advised to contact MD or [employer] to request reassessment of leg and proper fit.  THN CM Short Term Goal #2   Member will take medications as prescribed over the next 4 weeks  THN CM Short Term Goal #2 Start Date  03/28/18  [facility] CM Short Term Goal #2 Met Date  04/17/18     Valente Deontay, RN, MSN [place] Manager [phone]

## 2018-04-18 DIAGNOSIS — R269 Unspecified abnormalities of gait and mobility: Secondary | ICD-10-CM | POA: Diagnosis not present

## 2018-04-18 DIAGNOSIS — I69398 Other sequelae of cerebral infarction: Secondary | ICD-10-CM | POA: Diagnosis not present

## 2018-04-18 DIAGNOSIS — I5042 Chronic combined systolic (congestive) and diastolic (congestive) heart failure: Secondary | ICD-10-CM | POA: Diagnosis not present

## 2018-04-18 DIAGNOSIS — M150 Primary generalized (osteo)arthritis: Secondary | ICD-10-CM | POA: Diagnosis not present

## 2018-04-18 DIAGNOSIS — I130 Hypertensive heart and chronic kidney disease with heart failure and stage 1 through stage 4 chronic kidney disease, or unspecified chronic kidney disease: Secondary | ICD-10-CM | POA: Diagnosis not present

## 2018-04-19 ENCOUNTER — Other Ambulatory Visit: Payer: Self-pay

## 2018-04-19 DIAGNOSIS — R269 Unspecified abnormalities of gait and mobility: Secondary | ICD-10-CM | POA: Diagnosis not present

## 2018-04-19 DIAGNOSIS — M150 Primary generalized (osteo)arthritis: Secondary | ICD-10-CM | POA: Diagnosis not present

## 2018-04-19 DIAGNOSIS — I69398 Other sequelae of cerebral infarction: Secondary | ICD-10-CM | POA: Diagnosis not present

## 2018-04-19 DIAGNOSIS — I5042 Chronic combined systolic (congestive) and diastolic (congestive) heart failure: Secondary | ICD-10-CM | POA: Diagnosis not present

## 2018-04-19 DIAGNOSIS — I130 Hypertensive heart and chronic kidney disease with heart failure and stage 1 through stage 4 chronic kidney disease, or unspecified chronic kidney disease: Secondary | ICD-10-CM | POA: Diagnosis not present

## 2018-04-19 NOTE — Patient Outreach (Signed)
[facility]) Care Management  04/19/2018  Jerry Clyne 07-Jul-1945 [id number]   Follow up with [facility] [place], [place] regarding referrals submitted for home modifications. BSW spoke with assistant at [place] was mailed to patient on 02/25/18 to be completed and sent back. As of today, they have not received completed paperwork and have made multiple calls to patient to inquire about this.  BSW spoke with patient's spouse, Trystian Crisanto, about this.  She reported that she sent completed paperwork back last week.  BSW will follow up with [place] [facility] again next week to ensure receipt and to request that paperwork be mailed out again if they still have not received it.  [place] [facility] is to assist with grab bars.  Referrals were submitted to [facility] and [facility] to assist with construction of walk in shower.   BSW left voicemail message with Michel Harrow at [place] regarding status of referral.   BSW spoke with [name] from [facility] regarding status of referral.  Unfortunately, she did not receive the referral that was submitted via the [employer] platform. BSW has attempted to follow up on this referral multiple times since submitting it on 02/25/18 but was just able to connect with [place] today.  [name] requested that BSW send referral information via secure email.  Email was sent, Kermit Balo confirmed receipt, and said that she will reach out to Mrs. Lissa Merlin.  Mrs. Cybulski was contacted by SCAT and the eligibility interview has been scheduled for 05/07/18.  BSW will follow with with Mrs. Kittel again next week regarding [place] [facility] paperwork.  Ronn Melena, BSW Social Worker [phone]

## 2018-04-22 ENCOUNTER — Telehealth: Payer: Self-pay | Admitting: Neurology

## 2018-04-22 DIAGNOSIS — R269 Unspecified abnormalities of gait and mobility: Secondary | ICD-10-CM | POA: Diagnosis not present

## 2018-04-22 DIAGNOSIS — I5042 Chronic combined systolic (congestive) and diastolic (congestive) heart failure: Secondary | ICD-10-CM | POA: Diagnosis not present

## 2018-04-22 DIAGNOSIS — I130 Hypertensive heart and chronic kidney disease with heart failure and stage 1 through stage 4 chronic kidney disease, or unspecified chronic kidney disease: Secondary | ICD-10-CM | POA: Diagnosis not present

## 2018-04-22 DIAGNOSIS — I69398 Other sequelae of cerebral infarction: Secondary | ICD-10-CM | POA: Diagnosis not present

## 2018-04-22 DIAGNOSIS — M150 Primary generalized (osteo)arthritis: Secondary | ICD-10-CM | POA: Diagnosis not present

## 2018-04-22 MED ORDER — LAMOTRIGINE 100 MG PO TABS: 100.0000 mg | ORAL_TABLET | Freq: Two times a day (BID) | ORAL | 0 refills | Status: DC

## 2018-04-22 NOTE — Addendum Note (Signed)
Addended by: Verlin Grills T on: 04/22/2018 03:34 PM   Modules accepted: Orders

## 2018-04-22 NOTE — Telephone Encounter (Signed)
I contacted the pt's wife (Dianee ok per dpr). She states she received a letter in the mail today from [employer] [facility] recalling the pt's Lamictal due to small amounts of enalapril possibly being present. Pt was advised by [employer] [facility] to return Lamictal and a new shipment would be processed(pt should receive tomorrow (04/23/18). I advised while waiting we could send a 7 day supply of Lamictal to [place] wife was agreeable.

## 2018-04-22 NOTE — Telephone Encounter (Signed)
Pt wife(on DPR) has called to inform they have received a letter re: a recall on the lamoTRIgine (LAMICTAL) 100 MG tablet because it contains Enalapril.  Please call

## 2018-04-22 NOTE — Telephone Encounter (Signed)
Events noted

## 2018-04-23 ENCOUNTER — Other Ambulatory Visit: Payer: Self-pay | Admitting: Pharmacy Technician

## 2018-04-23 DIAGNOSIS — R269 Unspecified abnormalities of gait and mobility: Secondary | ICD-10-CM | POA: Diagnosis not present

## 2018-04-23 DIAGNOSIS — I69398 Other sequelae of cerebral infarction: Secondary | ICD-10-CM | POA: Diagnosis not present

## 2018-04-23 DIAGNOSIS — I5042 Chronic combined systolic (congestive) and diastolic (congestive) heart failure: Secondary | ICD-10-CM | POA: Diagnosis not present

## 2018-04-23 DIAGNOSIS — I130 Hypertensive heart and chronic kidney disease with heart failure and stage 1 through stage 4 chronic kidney disease, or unspecified chronic kidney disease: Secondary | ICD-10-CM | POA: Diagnosis not present

## 2018-04-23 DIAGNOSIS — M150 Primary generalized (osteo)arthritis: Secondary | ICD-10-CM | POA: Diagnosis not present

## 2018-04-23 NOTE — Patient Outreach (Signed)
[facility]) Care Management  04/23/2018  [place] President. 1945/10/19 [id number]   Received patient portion(s) of patient assistance application for Humalog and Basaglar. Faxed completed application and required documents into [employer].  Will follow up with company in 7-10 business days to check status of application.  Maud Deed Chana Bode [place] Certified Pharmacy Technician [place] Management Direct Dial:[phone]

## 2018-04-24 ENCOUNTER — Other Ambulatory Visit: Payer: Self-pay | Admitting: *Deleted

## 2018-04-24 DIAGNOSIS — I5042 Chronic combined systolic (congestive) and diastolic (congestive) heart failure: Secondary | ICD-10-CM | POA: Diagnosis not present

## 2018-04-24 DIAGNOSIS — I69398 Other sequelae of cerebral infarction: Secondary | ICD-10-CM | POA: Diagnosis not present

## 2018-04-24 DIAGNOSIS — M150 Primary generalized (osteo)arthritis: Secondary | ICD-10-CM | POA: Diagnosis not present

## 2018-04-24 DIAGNOSIS — I130 Hypertensive heart and chronic kidney disease with heart failure and stage 1 through stage 4 chronic kidney disease, or unspecified chronic kidney disease: Secondary | ICD-10-CM | POA: Diagnosis not present

## 2018-04-24 DIAGNOSIS — R269 Unspecified abnormalities of gait and mobility: Secondary | ICD-10-CM | POA: Diagnosis not present

## 2018-04-24 NOTE — Patient Outreach (Signed)
[facility]) Care Management  04/24/2018  [name]. [birth year] [id number]   Weekly transition of care call placed to member/family, no answer.  HIPAA compliant voice message left.  Will follow up within the next 4 business days.  Valente Graeden, [place], MSN [place] [phone]

## 2018-04-25 ENCOUNTER — Other Ambulatory Visit: Payer: Self-pay | Admitting: Pharmacy Technician

## 2018-04-25 ENCOUNTER — Ambulatory Visit: Payer: Self-pay

## 2018-04-25 ENCOUNTER — Other Ambulatory Visit: Payer: Self-pay

## 2018-04-25 DIAGNOSIS — M150 Primary generalized (osteo)arthritis: Secondary | ICD-10-CM | POA: Diagnosis not present

## 2018-04-25 DIAGNOSIS — I130 Hypertensive heart and chronic kidney disease with heart failure and stage 1 through stage 4 chronic kidney disease, or unspecified chronic kidney disease: Secondary | ICD-10-CM | POA: Diagnosis not present

## 2018-04-25 DIAGNOSIS — I69398 Other sequelae of cerebral infarction: Secondary | ICD-10-CM | POA: Diagnosis not present

## 2018-04-25 DIAGNOSIS — I5042 Chronic combined systolic (congestive) and diastolic (congestive) heart failure: Secondary | ICD-10-CM | POA: Diagnosis not present

## 2018-04-25 DIAGNOSIS — R269 Unspecified abnormalities of gait and mobility: Secondary | ICD-10-CM | POA: Diagnosis not present

## 2018-04-25 NOTE — Patient Outreach (Signed)
[facility]) Care Management  04/25/2018  [name] 18-Jan-1946 [id number]   Outreach to [place] regarding paperwork that was mailed by patient's spouse requesting assistance with grab bars being installed in the home. Representative said that she would check with person that manages mailed documentation to ensure receipt.  Another packet will be mailed to patient if not received.  BSW will follow up with [place] [facility] again next week as well as [employer] regarding status of referrals.  Ronn Melena, BSW Social Worker [phone]

## 2018-04-25 NOTE — Patient Outreach (Signed)
[facility]) Care Management  04/25/2018  [name]. 05-02-1945 [id number]   Received patient portion(s) of patient assistance application for [name] Z. Faxed completed application and required documents into [employer].  Also received application for Bidil back, will fax to Dr. Johnsie Cancel for providers portion to be filled out.  Will follow up with company [employer]) in 10-14 business days to check status of application.  Maud Deed Chana Bode [place] Certified Pharmacy Technician [place] Management Direct [phone]

## 2018-04-26 DIAGNOSIS — I5042 Chronic combined systolic (congestive) and diastolic (congestive) heart failure: Secondary | ICD-10-CM | POA: Diagnosis not present

## 2018-04-26 DIAGNOSIS — M150 Primary generalized (osteo)arthritis: Secondary | ICD-10-CM | POA: Diagnosis not present

## 2018-04-26 DIAGNOSIS — I130 Hypertensive heart and chronic kidney disease with heart failure and stage 1 through stage 4 chronic kidney disease, or unspecified chronic kidney disease: Secondary | ICD-10-CM | POA: Diagnosis not present

## 2018-04-26 DIAGNOSIS — I69398 Other sequelae of cerebral infarction: Secondary | ICD-10-CM | POA: Diagnosis not present

## 2018-04-26 DIAGNOSIS — R269 Unspecified abnormalities of gait and mobility: Secondary | ICD-10-CM | POA: Diagnosis not present

## 2018-04-29 ENCOUNTER — Encounter: Payer: Self-pay | Admitting: *Deleted

## 2018-04-29 ENCOUNTER — Ambulatory Visit: Payer: Self-pay | Admitting: *Deleted

## 2018-04-29 ENCOUNTER — Other Ambulatory Visit: Payer: Self-pay | Admitting: *Deleted

## 2018-04-29 ENCOUNTER — Telehealth: Payer: Self-pay

## 2018-04-29 ENCOUNTER — Other Ambulatory Visit: Payer: Self-pay | Admitting: Pharmacy Technician

## 2018-04-29 NOTE — Telephone Encounter (Addendum)
We received a [name] Asst application for Bidil from [place] with [facility]. I have completed the provider part of the application and placed it in Dr Kyla Balzarine mail bin awaiting his signature.

## 2018-04-29 NOTE — Patient Outreach (Signed)
[facility]) Care Management [facility] Community CM Telephone Outreach, Transition of Care day 33 Post-hospital discharge day # 34  04/29/2018  [name] 07-09-1945 [id number]  Successful telephone outreach to [name], spouse, on [place], re: [name], 73 y/o male referred to [place] for transition of care after recent hospitalization January 5-7 for fever of unknown origin/ acute encephalopathy.  Patient was discharged from hospital to self care with home health services in place.  Patient has history including, but not limited to, HTN, DM-type II; CKD/ stage IV; and CHF.  HIPAA/ identity verified with patient;s spouse during phone call today.    Explained that I was contacting patient for primary [place] RN CM [facility]) in [place] team coverage.  Today, spouse reports that patient is "doing pretty good," and she denies specific clinical concerns around patient's status; denies that patient is in pain or had new/ recent falls. States patient is currently resting.  Caregiver/ spouse further reports:  -- home health services ended on Friday of last week; states that she and patient would like to have ongoing assistance for in-home bathing/ hygiene care, now that home health services are completed.  Spouse reports that she is limited in her ability to assist patient with hygiene around her own health issues.  Discussed with caregiver options around private duty in home care assistance, however, spouse states that she "knows already" that they are unable to afford this; states that she will consider seeking in-home care assistance for hygiene for patient by asking her friends/ family if they are aware of affordable in-home care providers; I also encouraged her to speak with [facility] BSW, currently involved in patient;s care for additional possible resources, and she agrees to do so.  -- no current concerns around patient's medications- states patient has and is taking all  medications as prescribed; reports one medication change to established medication sodium bicarbonate- reports now taking BID instead pf previously prescribed TID, around post- office visit renal provider instruction- medication list in EMR updated accordingly; spouse denies other changes to patient's medications today  -- discussed upcoming provider appointments with spouse, who provides accurate report of scheduled appointments and confirms that for now she will continue to provide transportation to all appointments; spouse reports upcoming SCAT team visit scheduled for 05/07/2018 ---- 04/30/2018: "eye doctor" appointment ---- 05/01/2018: procrit injection ---- 05/03/2018: urology provider appointment ---- 05/06/2018: Dr. [name]- for evaluation of prosthetic  Caregiver denies further issues, concerns, or problems today.  Discussed with caregiver that I would update [facility] Community RN CM Valente Sasha around our conversation today, and that [place] would follow up with patient upon her return to work.  Spouse verbalized understanding and agreement with this plan; confirmed that spouse has the [place] Management phone numbers should issues/ concerns/ problems arise prior to next outreach by primary [facility] RN CCM.  Plan:  Will update patient's primary [facility] RN CM of today's successful telephone outreach to patient  [facility] CM Care Plan Problem One     Most Recent Value  Care Plan Problem One  Risk for hospitalization related to confusion/encephalopathy as evidenced by recent admission  Role Documenting the Problem One  Care Management [place] for Problem One  Active  [facility] Long Term Goal   Member will not be readmitted to hospital within the next 31 days  [facility] Long Term Goal Start Date  03/28/18  [facility] Long Term Goal Met Date  04/29/18 [facility] Met]  Interventions for Problem One Long Term Goal  Confirmed with patient's spouse that patient has not experienced hospital re-admission post- last  hospitalization,  discussed with spouse patient's current clinical condition and reviewed upcoming scheduled provider appointments- confirmed that wife will transport patient to all scheduled provider appointments over next 2 weeks.  Confirmed that wife has no concerns around patient's medications, and updated current medication list in EMR to reflect recent change in dosing of NaHCO3  TH[facility] CM Short Term Goal #1   Wife will be able to verbalize plan for assessment of new prosthetic leg within the next 2 weeks  THN CM Short Term Goal #1 Start Date  04/29/18 [Goal extended today]  Interventions for Short Term Goal #1  Confirmed with patient's spouse that patient has scheduled office visit with orthopedic provider on 05/06/2018 to address patient's prosthetic,  confirmed that spouse will provide transportation to this appointment.  Confirmed with spouse that patient has had no new/ recent falls     Oneta Rack, RN, BSN, [employer] Coordinator [facility] Care Management  [phone]

## 2018-04-29 NOTE — Telephone Encounter (Signed)
**Note De-Identified  Obfuscation** Dr Johnsie Cancel has signed the application. Per his request I have written a note on the cover letter to [facility] asking them to refer future request to the pts PCP as Dr Johnsie Cancel only sees the pt on an as needed basis. I have faxed all to [facility] attn: Caryl Pina.

## 2018-04-29 NOTE — Patient Outreach (Signed)
[facility]) Care Management  04/29/2018  Aleksey Newbern. Oct 25, 1945 [id number]   Received provider portion(s) of patient assistance application for Bidil. Faxed completed application and required documents into [employer].  Will follow up with company in 7-10 business days to check status of application.  Maud Deed Chana Bode [place] Certified Pharmacy Technician [place] Management Direct Dial:[phone]

## 2018-04-30 DIAGNOSIS — Z961 Presence of intraocular lens: Secondary | ICD-10-CM | POA: Diagnosis not present

## 2018-04-30 DIAGNOSIS — E119 Type 2 diabetes mellitus without complications: Secondary | ICD-10-CM | POA: Diagnosis not present

## 2018-04-30 DIAGNOSIS — H401133 Primary open-angle glaucoma, bilateral, severe stage: Secondary | ICD-10-CM | POA: Diagnosis not present

## 2018-05-01 ENCOUNTER — Other Ambulatory Visit: Payer: Self-pay

## 2018-05-01 ENCOUNTER — Ambulatory Visit: Payer: Self-pay

## 2018-05-01 ENCOUNTER — Ambulatory Visit (HOSPITAL_COMMUNITY)
Admission: RE | Admit: 2018-05-01 | Discharge: 2018-05-01 | Disposition: A | Discharge status: Home / Self Care | Payer: Medicare Other | Source: Ambulatory Visit | Attending: Nephrology | Admitting: Nephrology

## 2018-05-01 VITALS — BP 110/83 | HR 67 | Temp 98.1°F | Resp 20

## 2018-05-01 DIAGNOSIS — N184 Chronic kidney disease, stage 4 (severe): Secondary | ICD-10-CM | POA: Insufficient documentation

## 2018-05-01 LAB — IRON AND TIBC
Iron: 87 ug/dL (ref 45–182)
SATURATION RATIOS: 37 % (ref 17.9–39.5)
TIBC: 234 ug/dL — ABNORMAL LOW (ref 250–450)
UIBC: 147 ug/dL

## 2018-05-01 LAB — POCT HEMOGLOBIN-HEMACUE: Hemoglobin: 10.7 g/dL — ABNORMAL LOW (ref 13.0–17.0)

## 2018-05-01 LAB — FERRITIN: Ferritin: 48 ng/mL (ref 24–336)

## 2018-05-01 MED ORDER — DARBEPOETIN ALFA 100 MCG/0.5ML IJ SOSY
100.0000 ug | PREFILLED_SYRINGE | INTRAMUSCULAR | Status: DC
  Administered 2018-05-01: 100 ug via SUBCUTANEOUS

## 2018-05-01 MED ORDER — DARBEPOETIN ALFA 100 MCG/0.5ML IJ SOSY
PREFILLED_SYRINGE | INTRAMUSCULAR | Status: AC
  Filled 2018-05-01: qty 0.5

## 2018-05-01 NOTE — Patient Outreach (Signed)
[place] [facility]) Care Management  05/01/2018  [name]. [birth year] [id number]   BSW contacted [place] and they reported that they still have not received completed paperwork for request for home modification.  BSW requested that another packet be mailed to patient.  BSW left voicemail message with [name] at [employer] inquiring about status of referral.  BSW attempted to contact patient's spouse, [name], to inform her of [facility] paperwork being resent but had to leave voicemail message.   Patient is scheduled for SCAT eligibility interview on 05/07/18 so BSW will follow up after that date regarding eligibility and above referrals.    Ronn Melena, BSW Social Worker [phone]

## 2018-05-02 ENCOUNTER — Other Ambulatory Visit: Payer: Self-pay | Admitting: Pharmacy Technician

## 2018-05-02 NOTE — Patient Outreach (Signed)
[facility]) Care Management  05/02/2018  [name]. Jun 28, 1945 [id number]    Follow up call placed to [facility] regarding patient assistance application(s) for Humalog and Basaglar , [name] confirms patient has been approved as of 2/7 . Medication should arrive at providers office in 7-10 business days.  Follow up call placed to [place] regarding patient assistance application(s) for Bidil , [name] states patient has been denied due to being over income.    Successful call placed to patients wife, [name] regarding patient assistance update for medications, HIPAA identifiers verified. Informed her of all details above. Informed her that I would apply thru PAN foundation for patients Bidil.  Placed application thru PAN for Bidil and patient was approved for $1000 of assistance.  Will follow up with patients wife in 2-3 business days with updated information.  Maud Deed Chana Bode [place] Certified Pharmacy Technician [place] Management Direct [phone]

## 2018-05-03 DIAGNOSIS — R350 Frequency of micturition: Secondary | ICD-10-CM | POA: Diagnosis not present

## 2018-05-03 DIAGNOSIS — R3915 Urgency of urination: Secondary | ICD-10-CM | POA: Diagnosis not present

## 2018-05-03 DIAGNOSIS — R3914 Feeling of incomplete bladder emptying: Secondary | ICD-10-CM | POA: Diagnosis not present

## 2018-05-06 ENCOUNTER — Other Ambulatory Visit: Payer: Self-pay | Admitting: Pharmacy Technician

## 2018-05-06 ENCOUNTER — Ambulatory Visit (INDEPENDENT_AMBULATORY_CARE_PROVIDER_SITE_OTHER): Payer: Medicare Other | Admitting: Physician Assistant

## 2018-05-06 ENCOUNTER — Encounter (INDEPENDENT_AMBULATORY_CARE_PROVIDER_SITE_OTHER): Payer: Self-pay | Admitting: Orthopedic Surgery

## 2018-05-06 VITALS — Ht 70.0 in | Wt 140.0 lb

## 2018-05-06 DIAGNOSIS — L84 Corns and callosities: Secondary | ICD-10-CM

## 2018-05-06 DIAGNOSIS — Z89512 Acquired absence of left leg below knee: Secondary | ICD-10-CM | POA: Diagnosis not present

## 2018-05-06 DIAGNOSIS — M14671 Charcot's joint, right ankle and foot: Secondary | ICD-10-CM | POA: Diagnosis not present

## 2018-05-06 DIAGNOSIS — I87321 Chronic venous hypertension (idiopathic) with inflammation of right lower extremity: Secondary | ICD-10-CM | POA: Diagnosis not present
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
## 2018-05-06 NOTE — Patient Outreach (Signed)
[place] [facility]) Care Management  05/06/2018  Caliph Borowiak. 05/09/1945 [id number]    Successful call placed to patients wife, [name] regarding patient assistance update for Bidil, HIPAA identifiers verified. Mrs. Obryant has not received email from [place] stating he has been approved for Bidil. Suggested that she check her spam mail and if it isn't in there to please contact me so that I can provide her with the billing information she will need to take to the pharmacy. She stated she would have her daughter check for her. She also confirmed that they picked up his Basaglar and Humalog from his providers office on Friday.   Informed her that [name] is running behind processing applications but as soon as I have an update I will inform her. She stated that would be fine.  She stated she will contact me if she has not received email from [employer] because he does have a refill coming close to being needed.  Maud Deed Chana Bode [place] Certified Pharmacy Technician [place] Management Direct [phone]

## 2018-05-08 ENCOUNTER — Other Ambulatory Visit: Payer: Self-pay | Admitting: Pharmacy Technician

## 2018-05-08 ENCOUNTER — Other Ambulatory Visit: Payer: Self-pay

## 2018-05-08 NOTE — Patient Outreach (Signed)
[facility]) Care Management  05/08/2018  Jakari Sada. 1945/05/07 [id number]   Successful follow up call to patient's spouse, Jay Haskew.  Mrs. Bignell confirmed receipt of packet from [facility]; this packet was sent to her for a second time because the [facility] reported not receiving the first packet that was mailed back to them.  Mfs. Fenlon has not heard from [name] at [employer] regarding referral.  BSW left voicemail message for [place] on 05/01/18 and 05/07/18 but has not received return phone call. Mr. Sproule had his eligibility interview with SCAT yesterday and was approved for transportation services.  BSW will continue to attempt to reach [name] at [facility] and follow up with Mrs. Meidinger once update is received.    Ronn Melena, BSW Social Worker [phone]

## 2018-05-13 NOTE — Patient Outreach (Signed)
[facility]) Care Management  05/13/2018  [place] Souter. 1945-05-20 [id number]   Follow up call made on 2/19 to [name] patient assistance. Junie Panning states patient has been denied for [name]. [name] is considered a "Primary" drug on [employer] formulary and because patients has insurance, he has been denied for medication.  Will follow up with patients wife in 2-3 business days to confirm all other meds have been received thru assistance programs that he has been approved for.  Maud Deed Chana Bode [place] Certified Pharmacy Technician [place] Management Direct Dial:[phone]

## 2018-05-14 ENCOUNTER — Other Ambulatory Visit: Payer: Medicare Other | Admitting: Internal Medicine

## 2018-05-14 DIAGNOSIS — Z515 Encounter for palliative care: Secondary | ICD-10-CM | POA: Diagnosis not present
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
## 2018-05-21 ENCOUNTER — Other Ambulatory Visit: Payer: Self-pay

## 2018-05-21 NOTE — Patient Outreach (Addendum)
[facility]) Care Management  05/21/2018  Doyle Kunath. 1945-07-20 [id number]   Unsuccessful outreach to patient's spouse regarding status of referral to [employer].  Left voicemail message on home and mobile numbers.  Will attempt to reach again within four business days.  Addendum: BSW received return call from patient's spouse.  BSW informed her that [facility] from [facility] reported leaving her a voicemail message.  BSW provided her with contact information for Kermit Balo and spouse said she would call her.   Per spouse, the Sherilyn Banker of their church was contacted by [employer] about assisting with installation of grab bars in the home.  Her Sherilyn Banker is supposed to be reaching out to someone that may be able to provide the work.  Spouse inquired about whether or not [facility] can assist with making some small repairs to the ramp at their home.  BSW informed her that they should be able to.  BSW will follow up with spouse again within the next three weeks.  Ronn Melena, BSW Social Worker [phone]

## 2018-05-23 ENCOUNTER — Ambulatory Visit: Payer: Self-pay

## 2018-05-27 ENCOUNTER — Other Ambulatory Visit: Payer: Self-pay | Admitting: *Deleted

## 2018-05-27 NOTE — Patient Outreach (Signed)
[facility]) Care Management  05/27/2018  [name]. 1945/10/08 [id number]   Call placed to member's caregiver/wife [name] to follow up on member's current health condition.  No answer, HIPAA compliant voice message left.  Will follow up within the next 4 business days.  Valente Lorie, [place], MSN [place] [phone]

## 2018-05-29 ENCOUNTER — Ambulatory Visit (HOSPITAL_COMMUNITY)
Admission: RE | Admit: 2018-05-29 | Discharge: 2018-05-29 | Disposition: A | Discharge status: Home / Self Care | Payer: Medicare Other | Source: Ambulatory Visit | Attending: Nephrology | Admitting: Nephrology

## 2018-05-29 ENCOUNTER — Other Ambulatory Visit: Payer: Self-pay

## 2018-05-29 VITALS — BP 115/64 | HR 70 | Temp 98.9°F | Resp 20

## 2018-05-29 DIAGNOSIS — N184 Chronic kidney disease, stage 4 (severe): Secondary | ICD-10-CM | POA: Insufficient documentation

## 2018-05-29 LAB — IRON AND TIBC
Iron: 85 ug/dL (ref 45–182)
Saturation Ratios: 35 % (ref 17.9–39.5)
TIBC: 244 ug/dL — ABNORMAL LOW (ref 250–450)
UIBC: 159 ug/dL

## 2018-05-29 LAB — POCT HEMOGLOBIN-HEMACUE: Hemoglobin: 11.1 g/dL — ABNORMAL LOW (ref 13.0–17.0)

## 2018-05-29 LAB — FERRITIN: Ferritin: 63 ng/mL (ref 24–336)

## 2018-05-29 MED ORDER — DARBEPOETIN ALFA 100 MCG/0.5ML IJ SOSY
PREFILLED_SYRINGE | INTRAMUSCULAR | Status: AC
  Filled 2018-05-29: qty 0.5

## 2018-05-29 MED ORDER — DARBEPOETIN ALFA 100 MCG/0.5ML IJ SOSY
100.0000 ug | PREFILLED_SYRINGE | INTRAMUSCULAR | Status: DC
  Administered 2018-05-29: 100 ug via SUBCUTANEOUS

## 2018-05-31 ENCOUNTER — Other Ambulatory Visit: Payer: Self-pay

## 2018-05-31 ENCOUNTER — Other Ambulatory Visit: Payer: Self-pay | Admitting: *Deleted

## 2018-05-31 NOTE — Patient Outreach (Signed)
[facility]) Care Management  05/31/2018  [name]. [birth year] [id number]   Call placed to member's caregiver/wife to follow up on current medical condition, second attempt.  She report member is doing very good.  He is no longer active with home health for nursing/PT but she state he has progressed well.  Report he has appointment with kidney specialist next week.  They have been approved for SCAT but state she will transport member herself to this appointment due to threat of covid-19.    She report that member has been re-fitted for his stump and will have a new prosthesis made.  Also report that she has been contacted by community agency and will have her church put in grab bars and look into having a shower seat installed.  Denies any other concerns at this time.    As member is stable, discussed with wife closing case versus ongoing disease management with health coach.  She is open to disease management.  Will notify primary MD of transition, will place referral to health coach.  [facility] CM Care Plan Problem One     Most Recent Value  Care Plan Problem One  Risk for hospitalization related to confusion/encephalopathy as evidenced by recent admission  Role Documenting the Problem One  Care Management [place] for Problem One  Active  [facility] CM Short Term Goal #1   Wife will be able to verbalize plan for assessment of new prosthetic leg within the next 2 weeks  [facility] CM Short Term Goal #1 Start Date  04/29/18  [facility] CM Short Term Goal #1 Met Date  05/31/18     Valente Ferlin, RN, MSN [place] [phone]

## 2018-06-03 ENCOUNTER — Other Ambulatory Visit: Payer: Self-pay | Admitting: *Deleted

## 2018-06-04 DIAGNOSIS — Z794 Long term (current) use of insulin: Secondary | ICD-10-CM | POA: Diagnosis not present

## 2018-06-04 DIAGNOSIS — E1122 Type 2 diabetes mellitus with diabetic chronic kidney disease: Secondary | ICD-10-CM | POA: Diagnosis not present

## 2018-06-06 ENCOUNTER — Other Ambulatory Visit: Payer: Self-pay | Admitting: Pharmacy Technician

## 2018-06-06 DIAGNOSIS — N184 Chronic kidney disease, stage 4 (severe): Secondary | ICD-10-CM | POA: Diagnosis not present

## 2018-06-06 NOTE — Patient Outreach (Addendum)
[facility] ([facility]) Care Management  06/06/2018  Devin E Reek Jr. 12/05/1945 [id number]    Incoming call from patients wife regarding patient assistance billing information for [employer], [name] called to request a copy of patients billing information be sent to her daughter to present to pharmacy. Sent daughter billing information via email. Email did not include any HIPAA information.  Reviewed with [name] again about obtaining refills for Basaglar and Humalog thru [facility], as well as Bidil thru the [employer]. Requested that she contact me in the future if she runs into any issues.  Follow up:  Will route note to [facility] RPh Colleen Summe for case closure.   N. , CPhT Certified Pharmacy Technician [facility] Care Management Direct Dial:[phone]  Addendum:  Case closed as patient assistance needs met.    Thank you for allowing [facility] pharmacy to be involved in this patient's care.     Colleen Summe, PharmD, BCPS Clinical Pharmacist [facility] [phone]        

## 2018-06-11 ENCOUNTER — Other Ambulatory Visit: Payer: Self-pay

## 2018-06-11 DIAGNOSIS — Z87898 Personal history of other specified conditions: Secondary | ICD-10-CM | POA: Diagnosis not present

## 2018-06-11 DIAGNOSIS — E1122 Type 2 diabetes mellitus with diabetic chronic kidney disease: Secondary | ICD-10-CM | POA: Diagnosis not present

## 2018-06-11 DIAGNOSIS — D631 Anemia in chronic kidney disease: Secondary | ICD-10-CM | POA: Diagnosis not present

## 2018-06-11 DIAGNOSIS — Z8673 Personal history of transient ischemic attack (TIA), and cerebral infarction without residual deficits: Secondary | ICD-10-CM | POA: Diagnosis not present

## 2018-06-11 DIAGNOSIS — I129 Hypertensive chronic kidney disease with stage 1 through stage 4 chronic kidney disease, or unspecified chronic kidney disease: Secondary | ICD-10-CM | POA: Diagnosis not present

## 2018-06-11 DIAGNOSIS — N2581 Secondary hyperparathyroidism of renal origin: Secondary | ICD-10-CM | POA: Diagnosis not present

## 2018-06-11 DIAGNOSIS — N184 Chronic kidney disease, stage 4 (severe): Secondary | ICD-10-CM | POA: Diagnosis not present

## 2018-06-11 NOTE — Patient Outreach (Signed)
[facility]) Care Management  06/11/2018  [name] 1945-07-14 [id number]   Successful outreach to patient's spouse regarding status of referrals to [facility] for installation of grab bars and [employer] for shower modification.   Spouse reported that she was contacted by [name] at [employer] and has received paperwork to complete and send back. She has not received any further communication from [facility], however, many community agencies are not conducting home visits at this time due to COVD-19.  Spouse verbalized understanding that the process for these modifications is likely to be significantly delayed.  BSW will follow up again within the next two months.  Ronn Melena, BSW Social Worker [phone]

## 2018-06-13 ENCOUNTER — Encounter (HOSPITAL_COMMUNITY): Payer: Self-pay | Admitting: Emergency Medicine

## 2018-06-13 ENCOUNTER — Emergency Department (HOSPITAL_COMMUNITY): Payer: Medicare Other

## 2018-06-13 ENCOUNTER — Emergency Department (HOSPITAL_COMMUNITY)
Admission: EM | Admit: 2018-06-13 | Discharge: 2018-06-13 | Disposition: A | Discharge status: Home / Self Care | Payer: Medicare Other | Attending: Emergency Medicine | Admitting: Emergency Medicine

## 2018-06-13 DIAGNOSIS — X31XXXA Exposure to excessive natural cold, initial encounter: Secondary | ICD-10-CM | POA: Diagnosis not present

## 2018-06-13 DIAGNOSIS — I5042 Chronic combined systolic (congestive) and diastolic (congestive) heart failure: Secondary | ICD-10-CM | POA: Insufficient documentation

## 2018-06-13 DIAGNOSIS — R4182 Altered mental status, unspecified: Secondary | ICD-10-CM | POA: Diagnosis present

## 2018-06-13 DIAGNOSIS — I130 Hypertensive heart and chronic kidney disease with heart failure and stage 1 through stage 4 chronic kidney disease, or unspecified chronic kidney disease: Secondary | ICD-10-CM | POA: Diagnosis not present

## 2018-06-13 DIAGNOSIS — N183 Chronic kidney disease, stage 3 (moderate): Secondary | ICD-10-CM | POA: Insufficient documentation

## 2018-06-13 DIAGNOSIS — E1122 Type 2 diabetes mellitus with diabetic chronic kidney disease: Secondary | ICD-10-CM | POA: Diagnosis not present

## 2018-06-13 DIAGNOSIS — R404 Transient alteration of awareness: Secondary | ICD-10-CM | POA: Diagnosis not present

## 2018-06-13 DIAGNOSIS — Z794 Long term (current) use of insulin: Secondary | ICD-10-CM | POA: Diagnosis not present

## 2018-06-13 DIAGNOSIS — R55 Syncope and collapse: Secondary | ICD-10-CM | POA: Insufficient documentation

## 2018-06-13 DIAGNOSIS — R4020 Unspecified coma: Secondary | ICD-10-CM | POA: Diagnosis not present

## 2018-06-13 DIAGNOSIS — I10 Essential (primary) hypertension: Secondary | ICD-10-CM | POA: Diagnosis not present

## 2018-06-13 DIAGNOSIS — T68XXXA Hypothermia, initial encounter: Secondary | ICD-10-CM | POA: Diagnosis not present

## 2018-06-13 DIAGNOSIS — Z79899 Other long term (current) drug therapy: Secondary | ICD-10-CM | POA: Insufficient documentation

## 2018-06-13 DIAGNOSIS — R531 Weakness: Secondary | ICD-10-CM | POA: Diagnosis not present

## 2018-06-13 LAB — URINALYSIS, COMPLETE (UACMP) WITH MICROSCOPIC
Bacteria, UA: NONE SEEN
Bilirubin Urine: NEGATIVE
Glucose, UA: NEGATIVE mg/dL
Hgb urine dipstick: NEGATIVE
Ketones, ur: NEGATIVE mg/dL
Leukocytes,Ua: NEGATIVE
Nitrite: NEGATIVE
Protein, ur: 100 mg/dL — AB
Specific Gravity, Urine: 1.01 (ref 1.005–1.030)
pH: 5 (ref 5.0–8.0)

## 2018-06-13 LAB — CBC WITH DIFFERENTIAL/PLATELET
Abs Immature Granulocytes: 0.01 10*3/uL (ref 0.00–0.07)
Basophils Absolute: 0 10*3/uL (ref 0.0–0.1)
Basophils Relative: 1 %
Eosinophils Absolute: 0.1 10*3/uL (ref 0.0–0.5)
Eosinophils Relative: 2 %
HCT: 35.9 % — ABNORMAL LOW (ref 39.0–52.0)
Hemoglobin: 11 g/dL — ABNORMAL LOW (ref 13.0–17.0)
Immature Granulocytes: 0 %
Lymphocytes Relative: 10 %
Lymphs Abs: 0.6 10*3/uL — ABNORMAL LOW (ref 0.7–4.0)
MCH: 28.9 pg (ref 26.0–34.0)
MCHC: 30.6 g/dL (ref 30.0–36.0)
MCV: 94.5 fL (ref 80.0–100.0)
Monocytes Absolute: 0.3 10*3/uL (ref 0.1–1.0)
Monocytes Relative: 6 %
NRBC: 0 % (ref 0.0–0.2)
Neutro Abs: 4.5 10*3/uL (ref 1.7–7.7)
Neutrophils Relative %: 81 %
Platelets: 192 10*3/uL (ref 150–400)
RBC: 3.8 MIL/uL — ABNORMAL LOW (ref 4.22–5.81)
RDW: 13.6 % (ref 11.5–15.5)
WBC: 5.6 10*3/uL (ref 4.0–10.5)

## 2018-06-13 LAB — TROPONIN I

## 2018-06-13 LAB — COMPREHENSIVE METABOLIC PANEL
ALT: 20 U/L (ref 0–44)
AST: 37 U/L (ref 15–41)
Albumin: 3.4 g/dL — ABNORMAL LOW (ref 3.5–5.0)
Alkaline Phosphatase: 56 U/L (ref 38–126)
Anion gap: 10 (ref 5–15)
BUN: 79 mg/dL — ABNORMAL HIGH (ref 8–23)
CHLORIDE: 113 mmol/L — AB (ref 98–111)
CO2: 22 mmol/L (ref 22–32)
Calcium: 8.5 mg/dL — ABNORMAL LOW (ref 8.9–10.3)
Creatinine, Ser: 3.85 mg/dL — ABNORMAL HIGH (ref 0.61–1.24)
GFR calc non Af Amer: 15 mL/min — ABNORMAL LOW (ref >60)
GFR, EST AFRICAN AMERICAN: 17 mL/min — AB (ref >60)
Glucose, Bld: 160 mg/dL — ABNORMAL HIGH (ref 70–99)
Potassium: 4.8 mmol/L (ref 3.5–5.1)
Sodium: 145 mmol/L (ref 135–145)
Total Bilirubin: 0.9 mg/dL (ref 0.3–1.2)
Total Protein: 6.5 g/dL (ref 6.5–8.1)

## 2018-06-13 LAB — LACTIC ACID, PLASMA: Lactic Acid, Venous: 0.7 mmol/L (ref 0.5–1.9)

## 2018-06-13 LAB — MAGNESIUM: Magnesium: 2.2 mg/dL (ref 1.7–2.4)

## 2018-06-13 LAB — TSH: TSH: 1.351 u[IU]/mL (ref 0.350–4.500)

## 2018-06-13 LAB — AMMONIA: Ammonia: 27 umol/L (ref 9–35)

## 2018-06-13 LAB — CBG MONITORING, ED: Glucose-Capillary: 128 mg/dL — ABNORMAL HIGH (ref 70–99)

## 2018-06-13 MED ORDER — SODIUM CHLORIDE 0.9 % IV BOLUS
500.0000 mL | Freq: Once | INTRAVENOUS | Status: AC
  Administered 2018-06-13: 500 mL via INTRAVENOUS

## 2018-06-13 NOTE — ED Notes (Signed)
Please note: Pt was able to use urinal with assistance. No foley was inserted.

## 2018-06-13 NOTE — ED Notes (Signed)
Bladder scan showed 521 ml.
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
## 2018-06-13 NOTE — Discharge Instructions (Addendum)
If the patient has any episodes of passing out/losing consciousness, dizziness, chest pain, shortness of breath, headache, confusion/altered mental status, vomiting, or any other new/concerning symptoms then return to the ER for evaluation.

## 2018-06-13 NOTE — ED Notes (Signed)
Patient verbalizes understanding of discharge instructions. Opportunity for questioning and answers were provided. Armband removed by staff, pt discharged from ED.  

## 2018-06-13 NOTE — ED Triage Notes (Signed)
Pt was found slumped in his chair by wife. EMS on scene say he was initially unresponsive but then started to come around. He has been answering questions.  He is lethargic. CBG was 187 but pt reports he feels like sugar is low. Is taking his meds. Hx of seizures.

## 2018-06-14 LAB — URINE CULTURE: Culture: NO GROWTH

## 2018-06-17 ENCOUNTER — Ambulatory Visit (INDEPENDENT_AMBULATORY_CARE_PROVIDER_SITE_OTHER): Payer: Medicare Other | Admitting: Orthopedic Surgery

## 2018-06-18 DIAGNOSIS — T68XXXD Hypothermia, subsequent encounter: Secondary | ICD-10-CM | POA: Diagnosis not present

## 2018-06-18 DIAGNOSIS — R55 Syncope and collapse: Secondary | ICD-10-CM | POA: Diagnosis not present

## 2018-06-18 LAB — CULTURE, BLOOD (ROUTINE X 2)
Culture: NO GROWTH
Culture: NO GROWTH
Special Requests: ADEQUATE

## 2018-06-19 DIAGNOSIS — Z89512 Acquired absence of left leg below knee: Secondary | ICD-10-CM | POA: Diagnosis not present

## 2018-06-22 DIAGNOSIS — I10 Essential (primary) hypertension: Secondary | ICD-10-CM | POA: Diagnosis not present

## 2018-06-22 DIAGNOSIS — R0902 Hypoxemia: Secondary | ICD-10-CM | POA: Diagnosis not present

## 2018-06-22 DIAGNOSIS — W19XXXA Unspecified fall, initial encounter: Secondary | ICD-10-CM | POA: Diagnosis not present

## 2018-06-22 DIAGNOSIS — R531 Weakness: Secondary | ICD-10-CM | POA: Diagnosis not present

## 2018-06-22 DIAGNOSIS — R079 Chest pain, unspecified: Secondary | ICD-10-CM | POA: Diagnosis not present

## 2018-06-26 ENCOUNTER — Ambulatory Visit (HOSPITAL_COMMUNITY)
Admission: RE | Admit: 2018-06-26 | Discharge: 2018-06-26 | Disposition: A | Discharge status: Home / Self Care | Payer: Medicare Other | Source: Ambulatory Visit | Attending: Nephrology | Admitting: Nephrology

## 2018-06-26 ENCOUNTER — Other Ambulatory Visit: Payer: Self-pay

## 2018-06-26 VITALS — BP 152/71 | HR 74 | Temp 97.2°F | Resp 20

## 2018-06-26 DIAGNOSIS — N184 Chronic kidney disease, stage 4 (severe): Secondary | ICD-10-CM

## 2018-06-26 LAB — IRON AND TIBC
Iron: 83 ug/dL (ref 45–182)
Saturation Ratios: 36 % (ref 17.9–39.5)
TIBC: 234 ug/dL — ABNORMAL LOW (ref 250–450)
UIBC: 151 ug/dL

## 2018-06-26 LAB — FERRITIN: Ferritin: 58 ng/mL (ref 24–336)

## 2018-06-26 LAB — POCT HEMOGLOBIN-HEMACUE: Hemoglobin: 10.4 g/dL — ABNORMAL LOW (ref 13.0–17.0)

## 2018-06-26 MED ORDER — DARBEPOETIN ALFA 100 MCG/0.5ML IJ SOSY
100.0000 ug | PREFILLED_SYRINGE | INTRAMUSCULAR | Status: DC
  Administered 2018-06-26: 100 ug via SUBCUTANEOUS

## 2018-06-26 MED ORDER — DARBEPOETIN ALFA 100 MCG/0.5ML IJ SOSY
PREFILLED_SYRINGE | INTRAMUSCULAR | Status: AC
  Filled 2018-06-26: qty 0.5

## 2018-07-03 ENCOUNTER — Ambulatory Visit: Payer: Self-pay | Admitting: *Deleted

## 2018-07-08 DIAGNOSIS — R55 Syncope and collapse: Secondary | ICD-10-CM | POA: Diagnosis not present

## 2018-07-08 DIAGNOSIS — R569 Unspecified convulsions: Secondary | ICD-10-CM | POA: Diagnosis not present

## 2018-07-12 ENCOUNTER — Other Ambulatory Visit: Payer: Self-pay | Admitting: *Deleted

## 2018-07-12 NOTE — Patient Outreach (Signed)
[facility]) Care Management  07/12/2018  [name] [birth year] [id number]   RN Health Coach attempted follow up outreach call to patient.  Patient was unavailable. HIPPA compliance voicemail message left with return callback number.  Plan: RN will call patient again within 30 days.  [facility] Care Management [phone]

## 2018-07-16 ENCOUNTER — Other Ambulatory Visit: Payer: Medicare Other | Admitting: Internal Medicine

## 2018-07-16 ENCOUNTER — Other Ambulatory Visit: Payer: Self-pay

## 2018-07-16 DIAGNOSIS — Z515 Encounter for palliative care: Secondary | ICD-10-CM

## 2018-07-16 DIAGNOSIS — R2681 Unsteadiness on feet: Secondary | ICD-10-CM | POA: Diagnosis not present

## 2018-07-16 DIAGNOSIS — R55 Syncope and collapse: Secondary | ICD-10-CM | POA: Diagnosis not present

## 2018-07-16 DIAGNOSIS — Z9181 History of falling: Secondary | ICD-10-CM | POA: Diagnosis not present

## 2018-07-16 DIAGNOSIS — R29898 Other symptoms and signs involving the musculoskeletal system: Secondary | ICD-10-CM | POA: Diagnosis not present

## 2018-07-16 DIAGNOSIS — R296 Repeated falls: Secondary | ICD-10-CM | POA: Diagnosis not present

## 2018-07-16 NOTE — Progress Notes (Signed)
    [facility] Consult Note Telephone: [phone]  Fax: [phone]  PATIENT NAME: [name]. DOB: [birth year] MRN: [id number]  PRIMARY CARE PROVIDER:   Deland Pretty, MD  NOTE:  Phoned patient on listed cell and home numbers to complete previously scheduled home visit.  Voice recordings received on both listed numbers.  This NP left the contact number for patient or his wife to return the call.  Palliative care will continue to make attempts to F/U with patient.      Gonzella Lex, NP

## 2018-07-17 DIAGNOSIS — E1142 Type 2 diabetes mellitus with diabetic polyneuropathy: Secondary | ICD-10-CM | POA: Diagnosis not present

## 2018-07-17 DIAGNOSIS — M150 Primary generalized (osteo)arthritis: Secondary | ICD-10-CM | POA: Diagnosis not present

## 2018-07-17 DIAGNOSIS — I130 Hypertensive heart and chronic kidney disease with heart failure and stage 1 through stage 4 chronic kidney disease, or unspecified chronic kidney disease: Secondary | ICD-10-CM | POA: Diagnosis not present

## 2018-07-19 DIAGNOSIS — I130 Hypertensive heart and chronic kidney disease with heart failure and stage 1 through stage 4 chronic kidney disease, or unspecified chronic kidney disease: Secondary | ICD-10-CM | POA: Diagnosis not present

## 2018-07-19 DIAGNOSIS — M150 Primary generalized (osteo)arthritis: Secondary | ICD-10-CM | POA: Diagnosis not present

## 2018-07-19 DIAGNOSIS — E1142 Type 2 diabetes mellitus with diabetic polyneuropathy: Secondary | ICD-10-CM | POA: Diagnosis not present

## 2018-07-22 ENCOUNTER — Telehealth: Payer: Self-pay

## 2018-07-22 DIAGNOSIS — I130 Hypertensive heart and chronic kidney disease with heart failure and stage 1 through stage 4 chronic kidney disease, or unspecified chronic kidney disease: Secondary | ICD-10-CM | POA: Diagnosis not present

## 2018-07-22 DIAGNOSIS — E1142 Type 2 diabetes mellitus with diabetic polyneuropathy: Secondary | ICD-10-CM | POA: Diagnosis not present

## 2018-07-22 DIAGNOSIS — M150 Primary generalized (osteo)arthritis: Secondary | ICD-10-CM | POA: Diagnosis not present

## 2018-07-22 NOTE — Telephone Encounter (Signed)
I contacted the pt's wife [name]) and was able to update the pt's chart for 07/23/18 appt.  She understands that although there may be some limitations with this type of visit, we will take all precautions to reduce any security or privacy concerns.  She understands that this will be treated like an in office visit and we will file with pt's insurance, and there may be a patient responsible charge related to this service.  Meds, allergies and pmh have been updated.  Dr. Shelia Media referred the pt for syncope.

## 2018-07-23 ENCOUNTER — Telehealth: Payer: Self-pay | Admitting: Internal Medicine

## 2018-07-23 ENCOUNTER — Ambulatory Visit (INDEPENDENT_AMBULATORY_CARE_PROVIDER_SITE_OTHER): Payer: Medicare Other | Admitting: Neurology

## 2018-07-23 ENCOUNTER — Other Ambulatory Visit: Payer: Self-pay

## 2018-07-23 ENCOUNTER — Encounter: Payer: Self-pay | Admitting: Neurology

## 2018-07-23 DIAGNOSIS — R55 Syncope and collapse: Secondary | ICD-10-CM

## 2018-07-23 DIAGNOSIS — M150 Primary generalized (osteo)arthritis: Secondary | ICD-10-CM | POA: Diagnosis not present

## 2018-07-23 DIAGNOSIS — I130 Hypertensive heart and chronic kidney disease with heart failure and stage 1 through stage 4 chronic kidney disease, or unspecified chronic kidney disease: Secondary | ICD-10-CM | POA: Diagnosis not present

## 2018-07-23 DIAGNOSIS — G40909 Epilepsy, unspecified, not intractable, without status epilepticus: Secondary | ICD-10-CM | POA: Diagnosis not present

## 2018-07-23 DIAGNOSIS — E1142 Type 2 diabetes mellitus with diabetic polyneuropathy: Secondary | ICD-10-CM | POA: Diagnosis not present

## 2018-07-23 MED ORDER — LAMOTRIGINE 150 MG PO TABS: 150.0000 mg | ORAL_TABLET | Freq: Two times a day (BID) | ORAL | 3 refills | Status: AC

## 2018-07-23 NOTE — Progress Notes (Signed)
Virtual Visit via Video Note  I connected with [name]. on 07/23/18 at  9:30 AM EDT by a video enabled telemedicine application and verified that I am speaking with the correct person using two identifiers.  Location: Patient: The patient is at home. Provider: Physician in office.   I discussed the limitations of evaluation and management by telemedicine and the availability of in person appointments. The patient expressed understanding and agreed to proceed.  History of Present Illness: [name] is a 73 year old right-handed black male with a history of cerebrovascular disease with severe small vessel changes by CT and MRI of the brain, he has sustained a left posterior cerebral artery distribution stroke as well.  The patient has a history of seizures likely related to the cerebrovascular disease.  He does have a progressive dementing illness, he is on Namenda for this.  The patient is on Lamictal for seizures, he has not had a seizure in many years.  The patient has begun having syncopal events however.  The first such event occurred on January 20, 2018, he went to the emergency room was found to have a systolic blood pressure in the 80s.  The patient has had ongoing recurrence of syncopal events, almost always occurring while sitting eating breakfast in the morning.  He had 2 or 3 such events back to back on 13 June 2018, by the time EMS arrived his blood pressure was unremarkable.  His blood sugars were normal.  The patient may lose consciousness for 3 to 5 minutes, he does not stiffen or jerk, he may twitch slightly with arms.  He recalls nothing of the event.  He has not clammy, he does not complain of chest pain or palpitations of the heart.  He has had some weight loss, his last blood work done on 13 June 2018 shows worsening renal function, he will be seeing his nephrologist in the next day or so.  The patient has lost about 5 pounds over the last 6 months.  He is having  some increased problems with agitation.  He is on Lamictal 100 mg twice daily.  He will fall intermittently, he keeps forgetting to use his walker.  With the walker, he has good stability.   Observations/Objective: On the video evaluation, the patient is alert and cooperative, he will follow commands.  He has full extraocular movements.  Speech is well enunciated, not aphasic.  The patient is able to protrude the tongue in the midline with good lateral movement of the tongue.  Facial symmetry is present.  He has good finger-nose-finger bilaterally.  He has a prosthetic lower limb.  The patient can walk with a walker with good stability.  Assessment and Plan: 1.  Cerebrovascular disease  2.  History of seizures  3.  Recurrent syncopal events  4.  Dementia  5.  Chronic renal insufficiency  The patient will be increased on the Lamictal to 100 mg in the morning and 150 mg in the evening for 2 weeks and then go to 150 mg twice daily.  This is in part to help the agitation.  I do not believe that the most recent syncopal events represent seizures, the patient may be having vasovagal syncopal events, on his last ER visit on 13 June 2018 he had significant retention of urine in the bladder, they were able to get out 500 cc of urine.  It is possible this is inducing the vasovagal events.  The patient has had  documented hypotension in the past with similar syncopal events.  The wife is to check blood pressures if another event occurs.  Events tend to occur while eating breakfast, and may represent postprandial hypotension.  The patient will follow-up for his next scheduled visit in July, we will check lipid levels at that point.  Follow Up Instructions: Follow-up in July with nurse practitioner.   I discussed the assessment and treatment plan with the patient. The patient was provided an opportunity to ask questions and all were answered. The patient agreed with the plan and demonstrated an understanding  of the instructions.   The patient was advised to call back or seek an in-person evaluation if the symptoms worsen or if the condition fails to improve as anticipated.  I provided 30 minutes of non-face-to-face time during this encounter.   Kathrynn Ducking, MD

## 2018-07-23 NOTE — Telephone Encounter (Signed)
Readdressed goals of care with wife which include no initiation of hemodialysis.  She would like for patient to continue to reside at home but "it is getting harder to manage" and she has considered placement.  Discussed in home aid/caregiver service to assist with personal care. Maintain hydration and add nutritional supplement for stabalization of blood glucose levels when not eating well. Plan on discussion with PCP related to ordering hospital bed for home [place] related to multiple falls and imbalance, -Consider initiation of med to help improve mood and agitation in light of dementia(Haldol, Seroquel)  -Consider behavioral health evaluation -Encouraged wife to seek assistance from her own PCP for her own physical and mental health needs. -Discussed pt status with Hospice MD.  Will need to continue to have Palliative care follow at this time until additional decline occurs.  Will contact wife/patient with next plan of care after conversation with PCP(Lonnie Delilah Shan, NP)

## 2018-07-23 NOTE — Telephone Encounter (Addendum)
Returned call to wife [name].  She reports that patient has been having increased falls(2 today and daily over past 3 days).  Denies injury or striking head during falls.  Reports that he has lost his balance or refused to use walker during ambulation.  He has to eventually get himself up because she is unable to help his up due to her own health issues. He also has been having increased agitation, aggressiveness and less cooperation with his wife.  More argumentative with family members.  He continues to take Effexor 75mg  ER and Namenda 10mg  BID.  He has also had what is described as syncopal episodes with "fainting" while sitting at kitchen table

## 2018-07-24 ENCOUNTER — Other Ambulatory Visit: Payer: Self-pay

## 2018-07-24 ENCOUNTER — Ambulatory Visit (HOSPITAL_COMMUNITY)
Admission: RE | Admit: 2018-07-24 | Discharge: 2018-07-24 | Disposition: A | Discharge status: Home / Self Care | Payer: Medicare Other | Source: Ambulatory Visit | Attending: Nephrology | Admitting: Nephrology

## 2018-07-24 VITALS — BP 153/76 | HR 65 | Temp 97.7°F | Resp 18

## 2018-07-24 DIAGNOSIS — N184 Chronic kidney disease, stage 4 (severe): Secondary | ICD-10-CM | POA: Diagnosis not present

## 2018-07-24 DIAGNOSIS — D631 Anemia in chronic kidney disease: Secondary | ICD-10-CM | POA: Diagnosis not present

## 2018-07-24 DIAGNOSIS — N2581 Secondary hyperparathyroidism of renal origin: Secondary | ICD-10-CM | POA: Diagnosis not present

## 2018-07-24 DIAGNOSIS — I130 Hypertensive heart and chronic kidney disease with heart failure and stage 1 through stage 4 chronic kidney disease, or unspecified chronic kidney disease: Secondary | ICD-10-CM | POA: Diagnosis not present

## 2018-07-24 DIAGNOSIS — I129 Hypertensive chronic kidney disease with stage 1 through stage 4 chronic kidney disease, or unspecified chronic kidney disease: Secondary | ICD-10-CM | POA: Diagnosis not present

## 2018-07-24 DIAGNOSIS — E1122 Type 2 diabetes mellitus with diabetic chronic kidney disease: Secondary | ICD-10-CM | POA: Diagnosis not present

## 2018-07-24 DIAGNOSIS — M150 Primary generalized (osteo)arthritis: Secondary | ICD-10-CM | POA: Diagnosis not present

## 2018-07-24 DIAGNOSIS — E1142 Type 2 diabetes mellitus with diabetic polyneuropathy: Secondary | ICD-10-CM | POA: Diagnosis not present

## 2018-07-24 LAB — POCT HEMOGLOBIN-HEMACUE
Hemoglobin: 10.3 g/dL — ABNORMAL LOW (ref 13.0–17.0)
Hemoglobin: 12.5 g/dL — ABNORMAL LOW (ref 13.0–17.0)

## 2018-07-24 LAB — IRON AND TIBC
Iron: 82 ug/dL (ref 45–182)
Saturation Ratios: 34 % (ref 17.9–39.5)
TIBC: 238 ug/dL — ABNORMAL LOW (ref 250–450)
UIBC: 156 ug/dL

## 2018-07-24 LAB — FERRITIN: Ferritin: 83 ng/mL (ref 24–336)

## 2018-07-24 MED ORDER — DARBEPOETIN ALFA 100 MCG/0.5ML IJ SOSY
PREFILLED_SYRINGE | INTRAMUSCULAR | Status: AC
  Administered 2018-07-24: 100 ug via SUBCUTANEOUS
  Filled 2018-07-24: qty 0.5

## 2018-07-24 MED ORDER — DARBEPOETIN ALFA 100 MCG/0.5ML IJ SOSY
100.0000 ug | PREFILLED_SYRINGE | INTRAMUSCULAR | Status: DC
  Administered 2018-07-24: 11:00:00 100 ug via SUBCUTANEOUS

## 2018-07-26 DIAGNOSIS — I130 Hypertensive heart and chronic kidney disease with heart failure and stage 1 through stage 4 chronic kidney disease, or unspecified chronic kidney disease: Secondary | ICD-10-CM | POA: Diagnosis not present

## 2018-07-26 DIAGNOSIS — E1142 Type 2 diabetes mellitus with diabetic polyneuropathy: Secondary | ICD-10-CM | POA: Diagnosis not present

## 2018-07-26 DIAGNOSIS — M150 Primary generalized (osteo)arthritis: Secondary | ICD-10-CM | POA: Diagnosis not present

## 2018-07-29 DIAGNOSIS — I130 Hypertensive heart and chronic kidney disease with heart failure and stage 1 through stage 4 chronic kidney disease, or unspecified chronic kidney disease: Secondary | ICD-10-CM | POA: Diagnosis not present

## 2018-07-29 DIAGNOSIS — M150 Primary generalized (osteo)arthritis: Secondary | ICD-10-CM | POA: Diagnosis not present

## 2018-07-29 DIAGNOSIS — E1142 Type 2 diabetes mellitus with diabetic polyneuropathy: Secondary | ICD-10-CM | POA: Diagnosis not present

## 2018-07-30 DIAGNOSIS — E1142 Type 2 diabetes mellitus with diabetic polyneuropathy: Secondary | ICD-10-CM | POA: Diagnosis not present

## 2018-07-30 DIAGNOSIS — I130 Hypertensive heart and chronic kidney disease with heart failure and stage 1 through stage 4 chronic kidney disease, or unspecified chronic kidney disease: Secondary | ICD-10-CM | POA: Diagnosis not present

## 2018-07-30 DIAGNOSIS — M150 Primary generalized (osteo)arthritis: Secondary | ICD-10-CM | POA: Diagnosis not present

## 2018-07-31 DIAGNOSIS — I130 Hypertensive heart and chronic kidney disease with heart failure and stage 1 through stage 4 chronic kidney disease, or unspecified chronic kidney disease: Secondary | ICD-10-CM | POA: Diagnosis not present

## 2018-07-31 DIAGNOSIS — E1142 Type 2 diabetes mellitus with diabetic polyneuropathy: Secondary | ICD-10-CM | POA: Diagnosis not present

## 2018-07-31 DIAGNOSIS — M150 Primary generalized (osteo)arthritis: Secondary | ICD-10-CM | POA: Diagnosis not present

## 2018-08-02 DIAGNOSIS — E1142 Type 2 diabetes mellitus with diabetic polyneuropathy: Secondary | ICD-10-CM | POA: Diagnosis not present

## 2018-08-02 DIAGNOSIS — M150 Primary generalized (osteo)arthritis: Secondary | ICD-10-CM | POA: Diagnosis not present

## 2018-08-02 DIAGNOSIS — I130 Hypertensive heart and chronic kidney disease with heart failure and stage 1 through stage 4 chronic kidney disease, or unspecified chronic kidney disease: Secondary | ICD-10-CM | POA: Diagnosis not present

## 2018-08-05 DIAGNOSIS — I130 Hypertensive heart and chronic kidney disease with heart failure and stage 1 through stage 4 chronic kidney disease, or unspecified chronic kidney disease: Secondary | ICD-10-CM | POA: Diagnosis not present

## 2018-08-05 DIAGNOSIS — E1142 Type 2 diabetes mellitus with diabetic polyneuropathy: Secondary | ICD-10-CM | POA: Diagnosis not present

## 2018-08-05 DIAGNOSIS — M150 Primary generalized (osteo)arthritis: Secondary | ICD-10-CM | POA: Diagnosis not present

## 2018-08-06 DIAGNOSIS — E1142 Type 2 diabetes mellitus with diabetic polyneuropathy: Secondary | ICD-10-CM | POA: Diagnosis not present

## 2018-08-06 DIAGNOSIS — I130 Hypertensive heart and chronic kidney disease with heart failure and stage 1 through stage 4 chronic kidney disease, or unspecified chronic kidney disease: Secondary | ICD-10-CM | POA: Diagnosis not present

## 2018-08-06 DIAGNOSIS — M150 Primary generalized (osteo)arthritis: Secondary | ICD-10-CM | POA: Diagnosis not present

## 2018-08-07 DIAGNOSIS — E1142 Type 2 diabetes mellitus with diabetic polyneuropathy: Secondary | ICD-10-CM | POA: Diagnosis not present

## 2018-08-07 DIAGNOSIS — M150 Primary generalized (osteo)arthritis: Secondary | ICD-10-CM | POA: Diagnosis not present

## 2018-08-07 DIAGNOSIS — I130 Hypertensive heart and chronic kidney disease with heart failure and stage 1 through stage 4 chronic kidney disease, or unspecified chronic kidney disease: Secondary | ICD-10-CM | POA: Diagnosis not present

## 2018-08-08 DIAGNOSIS — E1142 Type 2 diabetes mellitus with diabetic polyneuropathy: Secondary | ICD-10-CM | POA: Diagnosis not present

## 2018-08-08 DIAGNOSIS — M150 Primary generalized (osteo)arthritis: Secondary | ICD-10-CM | POA: Diagnosis not present

## 2018-08-09 ENCOUNTER — Other Ambulatory Visit: Payer: Self-pay

## 2018-08-09 DIAGNOSIS — E1142 Type 2 diabetes mellitus with diabetic polyneuropathy: Secondary | ICD-10-CM | POA: Diagnosis not present

## 2018-08-09 DIAGNOSIS — R3915 Urgency of urination: Secondary | ICD-10-CM | POA: Diagnosis not present

## 2018-08-09 DIAGNOSIS — I130 Hypertensive heart and chronic kidney disease with heart failure and stage 1 through stage 4 chronic kidney disease, or unspecified chronic kidney disease: Secondary | ICD-10-CM | POA: Diagnosis not present

## 2018-08-09 DIAGNOSIS — M150 Primary generalized (osteo)arthritis: Secondary | ICD-10-CM | POA: Diagnosis not present

## 2018-08-09 NOTE — Patient Outreach (Signed)
[facility]) Care Management  08/09/2018  Aaron Bostwick 1946-01-20 [id number]   Successful outreach to patient's spouse regarding status of referrals to [place] and [employer] for home modifications.  Unfortunately, there has still not been any progress due to COVID19 restrictions.  BSW will follow up again in two months.  Ronn Melena, BSW Social Worker [phone]

## 2018-08-13 DIAGNOSIS — M150 Primary generalized (osteo)arthritis: Secondary | ICD-10-CM | POA: Diagnosis not present

## 2018-08-13 DIAGNOSIS — I130 Hypertensive heart and chronic kidney disease with heart failure and stage 1 through stage 4 chronic kidney disease, or unspecified chronic kidney disease: Secondary | ICD-10-CM | POA: Diagnosis not present

## 2018-08-13 DIAGNOSIS — E1142 Type 2 diabetes mellitus with diabetic polyneuropathy: Secondary | ICD-10-CM | POA: Diagnosis not present

## 2018-08-14 ENCOUNTER — Other Ambulatory Visit: Payer: Self-pay | Admitting: *Deleted

## 2018-08-16 DIAGNOSIS — E1142 Type 2 diabetes mellitus with diabetic polyneuropathy: Secondary | ICD-10-CM | POA: Diagnosis not present

## 2018-08-16 DIAGNOSIS — M150 Primary generalized (osteo)arthritis: Secondary | ICD-10-CM | POA: Diagnosis not present

## 2018-08-16 DIAGNOSIS — I130 Hypertensive heart and chronic kidney disease with heart failure and stage 1 through stage 4 chronic kidney disease, or unspecified chronic kidney disease: Secondary | ICD-10-CM | POA: Diagnosis not present

## 2018-08-19 DIAGNOSIS — E1142 Type 2 diabetes mellitus with diabetic polyneuropathy: Secondary | ICD-10-CM | POA: Diagnosis not present

## 2018-08-19 DIAGNOSIS — H401133 Primary open-angle glaucoma, bilateral, severe stage: Secondary | ICD-10-CM | POA: Diagnosis not present

## 2018-08-19 DIAGNOSIS — I130 Hypertensive heart and chronic kidney disease with heart failure and stage 1 through stage 4 chronic kidney disease, or unspecified chronic kidney disease: Secondary | ICD-10-CM | POA: Diagnosis not present

## 2018-08-19 DIAGNOSIS — R2689 Other abnormalities of gait and mobility: Secondary | ICD-10-CM | POA: Diagnosis not present

## 2018-08-19 DIAGNOSIS — M150 Primary generalized (osteo)arthritis: Secondary | ICD-10-CM | POA: Diagnosis not present

## 2018-08-21 ENCOUNTER — Other Ambulatory Visit: Payer: Self-pay

## 2018-08-21 ENCOUNTER — Ambulatory Visit (HOSPITAL_COMMUNITY)
Admission: RE | Admit: 2018-08-21 | Discharge: 2018-08-21 | Disposition: A | Discharge status: Home / Self Care | Payer: Medicare Other | Source: Ambulatory Visit | Attending: Nephrology | Admitting: Nephrology

## 2018-08-21 ENCOUNTER — Ambulatory Visit: Payer: Medicare Other | Admitting: Adult Health

## 2018-08-21 DIAGNOSIS — N184 Chronic kidney disease, stage 4 (severe): Secondary | ICD-10-CM | POA: Diagnosis not present

## 2018-08-21 LAB — IRON AND TIBC
Iron: 43 ug/dL — ABNORMAL LOW (ref 45–182)
Saturation Ratios: 18 % (ref 17.9–39.5)
TIBC: 238 ug/dL — ABNORMAL LOW (ref 250–450)
UIBC: 195 ug/dL

## 2018-08-21 LAB — FERRITIN: Ferritin: 74 ng/mL (ref 24–336)

## 2018-08-21 LAB — POCT HEMOGLOBIN-HEMACUE: Hemoglobin: 9.7 g/dL — ABNORMAL LOW (ref 13.0–17.0)

## 2018-08-21 MED ORDER — DARBEPOETIN ALFA 100 MCG/0.5ML IJ SOSY
100.0000 ug | PREFILLED_SYRINGE | INTRAMUSCULAR | Status: DC
  Administered 2018-08-21: 100 ug via SUBCUTANEOUS

## 2018-08-21 MED ORDER — DARBEPOETIN ALFA 100 MCG/0.5ML IJ SOSY
PREFILLED_SYRINGE | INTRAMUSCULAR | Status: AC
  Administered 2018-08-21: 100 ug via SUBCUTANEOUS
  Filled 2018-08-21: qty 0.5

## 2018-08-22 DIAGNOSIS — E1142 Type 2 diabetes mellitus with diabetic polyneuropathy: Secondary | ICD-10-CM | POA: Diagnosis not present

## 2018-08-22 DIAGNOSIS — M150 Primary generalized (osteo)arthritis: Secondary | ICD-10-CM | POA: Diagnosis not present

## 2018-08-22 DIAGNOSIS — I130 Hypertensive heart and chronic kidney disease with heart failure and stage 1 through stage 4 chronic kidney disease, or unspecified chronic kidney disease: Secondary | ICD-10-CM | POA: Diagnosis not present

## 2018-08-26 ENCOUNTER — Telehealth: Payer: Self-pay

## 2018-08-26 DIAGNOSIS — M150 Primary generalized (osteo)arthritis: Secondary | ICD-10-CM | POA: Diagnosis not present

## 2018-08-26 DIAGNOSIS — E1142 Type 2 diabetes mellitus with diabetic polyneuropathy: Secondary | ICD-10-CM | POA: Diagnosis not present

## 2018-08-26 DIAGNOSIS — I130 Hypertensive heart and chronic kidney disease with heart failure and stage 1 through stage 4 chronic kidney disease, or unspecified chronic kidney disease: Secondary | ICD-10-CM | POA: Diagnosis not present

## 2018-08-26 NOTE — Telephone Encounter (Signed)
Phone call placed to [facility] to inquire about order for hospital bed. Order not able to be located. Will follow up with PCP.

## 2018-08-26 NOTE — Telephone Encounter (Signed)
Message left for Dr. Shelia Media to request order for hospital bed and needed information, per [place]

## 2018-08-27 ENCOUNTER — Other Ambulatory Visit: Payer: Self-pay

## 2018-08-27 NOTE — Patient Outreach (Signed)
[facility]) Care Management  08/27/2018  Author [place]. 1945-06-05 [id number]   Incoming call from patient's spouse regarding the need for in-home support.  Patient is currently receiving PT with [facility] and did have an aide coming to the home twice per week but insurance will no longer cover this service.  Spouse reports that patient does not qualify for Medicaid.  She is aware that aide services will have to be paid for out-of-pocket.  Spouse requested that a list of local agencies be sent to her.  BSW provided her with contact information for [facility] and encouraged her to inquire about their respite service as well as caregiver resources.  BSW also provided her with contact information for [employer] and encouraged her to call to inquire about available services.    Ronn Melena, BSW Social Worker [phone]

## 2018-08-28 DIAGNOSIS — E1142 Type 2 diabetes mellitus with diabetic polyneuropathy: Secondary | ICD-10-CM | POA: Diagnosis not present

## 2018-08-28 DIAGNOSIS — I130 Hypertensive heart and chronic kidney disease with heart failure and stage 1 through stage 4 chronic kidney disease, or unspecified chronic kidney disease: Secondary | ICD-10-CM | POA: Diagnosis not present

## 2018-08-28 DIAGNOSIS — M150 Primary generalized (osteo)arthritis: Secondary | ICD-10-CM | POA: Diagnosis not present

## 2018-08-31 ENCOUNTER — Other Ambulatory Visit: Payer: Self-pay

## 2018-08-31 ENCOUNTER — Emergency Department (HOSPITAL_COMMUNITY)
Admission: EM | Admit: 2018-08-31 | Discharge: 2018-09-01 | Disposition: A | Discharge status: Home / Self Care | Payer: Medicare Other | Attending: Emergency Medicine | Admitting: Emergency Medicine

## 2018-08-31 DIAGNOSIS — Z8673 Personal history of transient ischemic attack (TIA), and cerebral infarction without residual deficits: Secondary | ICD-10-CM | POA: Insufficient documentation

## 2018-08-31 DIAGNOSIS — Z79899 Other long term (current) drug therapy: Secondary | ICD-10-CM | POA: Diagnosis not present

## 2018-08-31 DIAGNOSIS — S0990XA Unspecified injury of head, initial encounter: Secondary | ICD-10-CM | POA: Diagnosis present

## 2018-08-31 DIAGNOSIS — S0083XA Contusion of other part of head, initial encounter: Secondary | ICD-10-CM | POA: Diagnosis not present

## 2018-08-31 DIAGNOSIS — W19XXXA Unspecified fall, initial encounter: Secondary | ICD-10-CM

## 2018-08-31 DIAGNOSIS — I5022 Chronic systolic (congestive) heart failure: Secondary | ICD-10-CM | POA: Diagnosis not present

## 2018-08-31 DIAGNOSIS — W01198A Fall on same level from slipping, tripping and stumbling with subsequent striking against other object, initial encounter: Secondary | ICD-10-CM | POA: Insufficient documentation

## 2018-08-31 DIAGNOSIS — I130 Hypertensive heart and chronic kidney disease with heart failure and stage 1 through stage 4 chronic kidney disease, or unspecified chronic kidney disease: Secondary | ICD-10-CM | POA: Insufficient documentation

## 2018-08-31 DIAGNOSIS — I5032 Chronic diastolic (congestive) heart failure: Secondary | ICD-10-CM | POA: Insufficient documentation

## 2018-08-31 DIAGNOSIS — Y92012 Bathroom of single-family (private) house as the place of occurrence of the external cause: Secondary | ICD-10-CM | POA: Diagnosis not present

## 2018-08-31 DIAGNOSIS — N183 Chronic kidney disease, stage 3 (moderate): Secondary | ICD-10-CM | POA: Insufficient documentation

## 2018-08-31 DIAGNOSIS — Y998 Other external cause status: Secondary | ICD-10-CM | POA: Diagnosis not present

## 2018-08-31 DIAGNOSIS — Y9389 Activity, other specified: Secondary | ICD-10-CM | POA: Insufficient documentation

## 2018-08-31 DIAGNOSIS — E1122 Type 2 diabetes mellitus with diabetic chronic kidney disease: Secondary | ICD-10-CM | POA: Insufficient documentation

## 2018-08-31 DIAGNOSIS — Z794 Long term (current) use of insulin: Secondary | ICD-10-CM | POA: Insufficient documentation

## 2018-08-31 NOTE — ED Triage Notes (Signed)
Pt arrives with his wife who reports pt was in the bathroom and she heard him fall. She went in the bathroom, he was trying to get up, his hands slipped and he fell again. Fell forward. Reports feels like right face is swollen.  Pt is on Plavix, reporting he does not remember falling, or how he felt prior. Pt does have frequent falls. Pt is alert/oriented x 4 c/o feeling sore.   [name] is pt primary contact.

## 2018-09-01 ENCOUNTER — Other Ambulatory Visit: Payer: Self-pay

## 2018-09-01 ENCOUNTER — Encounter (HOSPITAL_COMMUNITY): Payer: Self-pay | Admitting: *Deleted

## 2018-09-01 LAB — BASIC METABOLIC PANEL
Anion gap: 11 (ref 5–15)
BUN: 74 mg/dL — ABNORMAL HIGH (ref 8–23)
CO2: 27 mmol/L (ref 22–32)
Calcium: 8.9 mg/dL (ref 8.9–10.3)
Chloride: 106 mmol/L (ref 98–111)
Creatinine, Ser: 4.07 mg/dL — ABNORMAL HIGH (ref 0.61–1.24)
GFR calc Af Amer: 16 mL/min — ABNORMAL LOW (ref >60)
GFR calc non Af Amer: 14 mL/min — ABNORMAL LOW (ref >60)
Glucose, Bld: 167 mg/dL — ABNORMAL HIGH (ref 70–99)
Potassium: 4.2 mmol/L (ref 3.5–5.1)
Sodium: 144 mmol/L (ref 135–145)

## 2018-09-01 LAB — CBC
HCT: 33.6 % — ABNORMAL LOW (ref 39.0–52.0)
Hemoglobin: 10.4 g/dL — ABNORMAL LOW (ref 13.0–17.0)
MCH: 30.2 pg (ref 26.0–34.0)
MCHC: 31 g/dL (ref 30.0–36.0)
MCV: 97.7 fL (ref 80.0–100.0)
Platelets: 237 10*3/uL (ref 150–400)
RBC: 3.44 MIL/uL — ABNORMAL LOW (ref 4.22–5.81)
RDW: 13.4 % (ref 11.5–15.5)
WBC: 5.9 10*3/uL (ref 4.0–10.5)
nRBC: 0 % (ref 0.0–0.2)

## 2018-09-01 NOTE — ED Notes (Signed)
Called daughter advised patient is getting discharge , reports in parking lot will drive down.
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
## 2018-09-02 DIAGNOSIS — Z7189 Other specified counseling: Secondary | ICD-10-CM | POA: Diagnosis not present

## 2018-09-02 DIAGNOSIS — N184 Chronic kidney disease, stage 4 (severe): Secondary | ICD-10-CM | POA: Diagnosis not present

## 2018-09-04 DIAGNOSIS — E1142 Type 2 diabetes mellitus with diabetic polyneuropathy: Secondary | ICD-10-CM | POA: Diagnosis not present

## 2018-09-04 DIAGNOSIS — E1122 Type 2 diabetes mellitus with diabetic chronic kidney disease: Secondary | ICD-10-CM | POA: Diagnosis not present

## 2018-09-04 DIAGNOSIS — Z794 Long term (current) use of insulin: Secondary | ICD-10-CM | POA: Diagnosis not present

## 2018-09-04 DIAGNOSIS — I130 Hypertensive heart and chronic kidney disease with heart failure and stage 1 through stage 4 chronic kidney disease, or unspecified chronic kidney disease: Secondary | ICD-10-CM | POA: Diagnosis not present

## 2018-09-04 DIAGNOSIS — M150 Primary generalized (osteo)arthritis: Secondary | ICD-10-CM | POA: Diagnosis not present

## 2018-09-06 DIAGNOSIS — E1142 Type 2 diabetes mellitus with diabetic polyneuropathy: Secondary | ICD-10-CM | POA: Diagnosis not present

## 2018-09-06 DIAGNOSIS — M150 Primary generalized (osteo)arthritis: Secondary | ICD-10-CM | POA: Diagnosis not present

## 2018-09-06 DIAGNOSIS — I130 Hypertensive heart and chronic kidney disease with heart failure and stage 1 through stage 4 chronic kidney disease, or unspecified chronic kidney disease: Secondary | ICD-10-CM | POA: Diagnosis not present

## 2018-09-10 ENCOUNTER — Encounter: Payer: Self-pay | Admitting: Neurology

## 2018-09-10 ENCOUNTER — Telehealth: Payer: Self-pay | Admitting: Neurology

## 2018-09-10 ENCOUNTER — Telehealth (INDEPENDENT_AMBULATORY_CARE_PROVIDER_SITE_OTHER): Payer: Medicare Other | Admitting: Neurology

## 2018-09-10 DIAGNOSIS — G40909 Epilepsy, unspecified, not intractable, without status epilepticus: Secondary | ICD-10-CM

## 2018-09-10 DIAGNOSIS — E46 Unspecified protein-calorie malnutrition: Secondary | ICD-10-CM | POA: Diagnosis not present

## 2018-09-10 DIAGNOSIS — R269 Unspecified abnormalities of gait and mobility: Secondary | ICD-10-CM | POA: Diagnosis not present

## 2018-09-10 DIAGNOSIS — D1723 Benign lipomatous neoplasm of skin and subcutaneous tissue of right leg: Secondary | ICD-10-CM | POA: Diagnosis not present

## 2018-09-10 DIAGNOSIS — F5101 Primary insomnia: Secondary | ICD-10-CM | POA: Diagnosis not present

## 2018-09-10 NOTE — Telephone Encounter (Signed)
Can you get this patient setup for a revisit with Dr. Jannifer Franklin in 3 months?

## 2018-09-10 NOTE — Progress Notes (Signed)
I have read the note, and I agree with the clinical assessment and plan.  [name]   

## 2018-09-10 NOTE — Addendum Note (Signed)
Addended by: Inis Sizer D on: 09/10/2018 03:26 PM   Modules accepted: Orders

## 2018-09-10 NOTE — Progress Notes (Signed)
Virtual Visit via Video Note  I connected with [name]. on 09/10/18 at 12:45 PM EDT by a video enabled telemedicine application and verified that I am speaking with the correct person using two identifiers.  Location: Patient: At his home  Provider: In the office    I discussed the limitations of evaluation and management by telemedicine and the availability of in person appointments. The patient expressed understanding and agreed to proceed.  History of Present Illness: 09/10/2018 SS: [name] is a 73 year old male with history of cerebrovascular disease, history of seizures likely related to the cerebrovascular disease.  He has a progressive dementing illness.  He has history of syncopal events.  He suffered a recent fall requiring transport to the ER in June 2020.  Apparently he fell while in the bathroom, there was no loss of consciousness. He presented with minor facial trauma, contusion to the right periorbital region.  He has history of agitation, and his last visit his Lamictal was increased to 150 mg twice daily.  He is also on this medication for seizures.  Since his last visit his primary care doctor started him on Haldol 1 mg twice daily for agitation.  His wife feels this is helped some.  She reports he is very sleepy during the day, does not sleep well during the night. Of most concern is his daytime sleepiness, evening agitation.  He continues to have recurrent falls, has had for this month.  He has chronic kidney disease, his last creatinine was 4.07 last week in the ER.  They have decided he will not pursue dialysis.  He has urinary incontinence/frequency.  He has not had any further syncopal events since his last visit.  He has not had any seizures.  His wife reports he has a poor appetite.  She has been encouraging him to mostly use a wheelchair for fall prevention.  He has a walker, but he often forgets to use it, resulting in falls.  At this time his wife is trying to  get assistance with home health.  They have established with the palliative care nurse.  He has a prosthetic left leg BKA.  In September 2019 his MMSE was 21/30.  He just completed physical therapy last week.   09/01/2018 lab evaluation showed: Hemoglobin 10.4, glucose 167, creatinine 4.07 (3.85 2 months ago)  07/23/2018 Dr. Jannifer Franklin: Gina Costilla is a 73 year old right-handed black male with a history of cerebrovascular disease with severe small vessel changes by CT and MRI of the brain, he has sustained a left posterior cerebral artery distribution stroke as well.  The patient has a history of seizures likely related to the cerebrovascular disease.  He does have a progressive dementing illness, he is on Namenda for this.  The patient is on Lamictal for seizures, he has not had a seizure in many years.  The patient has begun having syncopal events however.  The first such event occurred on January 20, 2018, he went to the emergency room was found to have a systolic blood pressure in the 80s.  The patient has had ongoing recurrence of syncopal events, almost always occurring while sitting eating breakfast in the morning.  He had 2 or 3 such events back to back on 13 June 2018, by the time EMS arrived his blood pressure was unremarkable.  His blood sugars were normal.  The patient may lose consciousness for 3 to 5 minutes, he does not stiffen or jerk, he may twitch slightly  with arms.  He recalls nothing of the event.  He has not clammy, he does not complain of chest pain or palpitations of the heart.  He has had some weight loss, his last blood work done on 13 June 2018 shows worsening renal function, he will be seeing his nephrologist in the next day or so.  The patient has lost about 5 pounds over the last 6 months.  He is having some increased problems with agitation.  He is on Lamictal 100 mg twice daily.  He will fall intermittently, he keeps forgetting to use his walker.  With the walker, he has good  stability.  Observations/Objective: Alert for the beginning of her visit, fell asleep shortly after starting, trying to perform memory test, he would not wake up, he was able to tell me the year, and his birthday, current location  He fell asleep in his wheelchair, would not ambulate  Assessment and Plan: 1. Cerebrovascular disease 2.  History of seizures 3.  Recurrent syncopal events 4.  Dementia 5. Drowsiness   He has not had recurrent seizure or syncopal events.  It sounds as though over the last several months, he has had more falls, daytime sleepiness, agitation in the evening.  He is working with primary care, 1 month ago was started on Haldol.  Dr. Jannifer Franklin had increased his Lamictal at his last visit to 150 mg twice daily, to hopefully help with agitation.  His wife has not seen much change with the medication adjustment.  He has chronic kidney disease, most recent creatinine was 4.07.  The daytime sleepiness could be a medication side effect?  His wife will bring him to the office to have lab work done today, specifically Lamictal level, ammonia level.  I will recheck CBC and CMP to screen for infection or worsening kidney function. I have encouraged his wife to continue to pursue home health.  I could not perform a [place]-blind, because he was asleep at the visit.  He has an appointment with his primary care doctor this afternoon, regarding a "bump" on his left discovered by his wife. In the future, we may need to see if there is an underlying sleep disorder.   Follow Up Instructions: 3 months for revisit with Dr. Jannifer Franklin    I discussed the assessment and treatment plan with the patient. The patient was provided an opportunity to ask questions and all were answered. The patient agreed with the plan and demonstrated an understanding of the instructions.   The patient was advised to call back or seek an in-person evaluation if the symptoms worsen or if the condition fails to improve as  anticipated.  I provided 25 minutes of non-face-to-face time during this encounter.   Evangeline Dakin, DNP  [facility] [address] [place], [place] [address] [phone]

## 2018-09-11 ENCOUNTER — Emergency Department (HOSPITAL_COMMUNITY): Payer: Medicare Other

## 2018-09-11 ENCOUNTER — Encounter (HOSPITAL_COMMUNITY): Payer: Self-pay

## 2018-09-11 ENCOUNTER — Inpatient Hospital Stay (HOSPITAL_COMMUNITY)
Admission: EM | Admit: 2018-09-11 | Discharge: 2018-09-14 | DRG: 101 | Disposition: A | Discharge status: Home / Self Care | Payer: Medicare Other | Attending: Internal Medicine | Admitting: Internal Medicine

## 2018-09-11 ENCOUNTER — Other Ambulatory Visit: Payer: Self-pay

## 2018-09-11 ENCOUNTER — Telehealth: Payer: Self-pay | Admitting: Internal Medicine

## 2018-09-11 DIAGNOSIS — G9389 Other specified disorders of brain: Secondary | ICD-10-CM | POA: Diagnosis not present

## 2018-09-11 DIAGNOSIS — I6782 Cerebral ischemia: Secondary | ICD-10-CM | POA: Diagnosis not present

## 2018-09-11 DIAGNOSIS — N184 Chronic kidney disease, stage 4 (severe): Secondary | ICD-10-CM | POA: Diagnosis present

## 2018-09-11 DIAGNOSIS — R0902 Hypoxemia: Secondary | ICD-10-CM | POA: Diagnosis not present

## 2018-09-11 DIAGNOSIS — F0150 Vascular dementia without behavioral disturbance: Secondary | ICD-10-CM

## 2018-09-11 DIAGNOSIS — D638 Anemia in other chronic diseases classified elsewhere: Secondary | ICD-10-CM | POA: Diagnosis not present

## 2018-09-11 DIAGNOSIS — I5042 Chronic combined systolic (congestive) and diastolic (congestive) heart failure: Secondary | ICD-10-CM | POA: Diagnosis not present

## 2018-09-11 DIAGNOSIS — E162 Hypoglycemia, unspecified: Secondary | ICD-10-CM

## 2018-09-11 DIAGNOSIS — R079 Chest pain, unspecified: Secondary | ICD-10-CM | POA: Diagnosis not present

## 2018-09-11 DIAGNOSIS — E1122 Type 2 diabetes mellitus with diabetic chronic kidney disease: Secondary | ICD-10-CM | POA: Diagnosis not present

## 2018-09-11 DIAGNOSIS — R410 Disorientation, unspecified: Secondary | ICD-10-CM

## 2018-09-11 DIAGNOSIS — R4182 Altered mental status, unspecified: Secondary | ICD-10-CM | POA: Diagnosis present

## 2018-09-11 DIAGNOSIS — Z66 Do not resuscitate: Secondary | ICD-10-CM | POA: Diagnosis present

## 2018-09-11 DIAGNOSIS — N179 Acute kidney failure, unspecified: Secondary | ICD-10-CM | POA: Diagnosis present

## 2018-09-11 DIAGNOSIS — R413 Other amnesia: Secondary | ICD-10-CM | POA: Diagnosis not present

## 2018-09-11 DIAGNOSIS — Z681 Body mass index (BMI) 19 or less, adult: Secondary | ICD-10-CM

## 2018-09-11 DIAGNOSIS — G9340 Encephalopathy, unspecified: Secondary | ICD-10-CM | POA: Diagnosis present

## 2018-09-11 DIAGNOSIS — G40909 Epilepsy, unspecified, not intractable, without status epilepticus: Principal | ICD-10-CM

## 2018-09-11 DIAGNOSIS — R55 Syncope and collapse: Secondary | ICD-10-CM

## 2018-09-11 DIAGNOSIS — I7389 Other specified peripheral vascular diseases: Secondary | ICD-10-CM | POA: Diagnosis present

## 2018-09-11 DIAGNOSIS — E46 Unspecified protein-calorie malnutrition: Secondary | ICD-10-CM | POA: Diagnosis not present

## 2018-09-11 DIAGNOSIS — R0602 Shortness of breath: Secondary | ICD-10-CM | POA: Diagnosis not present

## 2018-09-11 DIAGNOSIS — N400 Enlarged prostate without lower urinary tract symptoms: Secondary | ICD-10-CM | POA: Diagnosis present

## 2018-09-11 DIAGNOSIS — D649 Anemia, unspecified: Secondary | ICD-10-CM | POA: Diagnosis present

## 2018-09-11 DIAGNOSIS — Z89421 Acquired absence of other right toe(s): Secondary | ICD-10-CM | POA: Diagnosis not present

## 2018-09-11 DIAGNOSIS — F0390 Unspecified dementia without behavioral disturbance: Secondary | ICD-10-CM | POA: Diagnosis present

## 2018-09-11 DIAGNOSIS — Z03818 Encounter for observation for suspected exposure to other biological agents ruled out: Secondary | ICD-10-CM | POA: Diagnosis not present

## 2018-09-11 DIAGNOSIS — E1169 Type 2 diabetes mellitus with other specified complication: Secondary | ICD-10-CM | POA: Diagnosis not present

## 2018-09-11 DIAGNOSIS — Z8673 Personal history of transient ischemic attack (TIA), and cerebral infarction without residual deficits: Secondary | ICD-10-CM | POA: Diagnosis not present

## 2018-09-11 DIAGNOSIS — Z89512 Acquired absence of left leg below knee: Secondary | ICD-10-CM

## 2018-09-11 DIAGNOSIS — E11649 Type 2 diabetes mellitus with hypoglycemia without coma: Secondary | ICD-10-CM | POA: Diagnosis not present

## 2018-09-11 DIAGNOSIS — Z833 Family history of diabetes mellitus: Secondary | ICD-10-CM

## 2018-09-11 DIAGNOSIS — Z515 Encounter for palliative care: Secondary | ICD-10-CM | POA: Diagnosis not present

## 2018-09-11 DIAGNOSIS — I10 Essential (primary) hypertension: Secondary | ICD-10-CM | POA: Diagnosis present

## 2018-09-11 DIAGNOSIS — G319 Degenerative disease of nervous system, unspecified: Secondary | ICD-10-CM | POA: Diagnosis not present

## 2018-09-11 DIAGNOSIS — Z1159 Encounter for screening for other viral diseases: Secondary | ICD-10-CM | POA: Diagnosis not present

## 2018-09-11 DIAGNOSIS — I130 Hypertensive heart and chronic kidney disease with heart failure and stage 1 through stage 4 chronic kidney disease, or unspecified chronic kidney disease: Secondary | ICD-10-CM | POA: Diagnosis present

## 2018-09-11 DIAGNOSIS — R404 Transient alteration of awareness: Secondary | ICD-10-CM | POA: Diagnosis not present

## 2018-09-11 DIAGNOSIS — E119 Type 2 diabetes mellitus without complications: Secondary | ICD-10-CM

## 2018-09-11 DIAGNOSIS — Z794 Long term (current) use of insulin: Secondary | ICD-10-CM | POA: Diagnosis not present

## 2018-09-11 LAB — CBC WITH DIFFERENTIAL/PLATELET
Abs Immature Granulocytes: 0.01 10*3/uL (ref 0.00–0.07)
Basophils Absolute: 0 10*3/uL (ref 0.0–0.1)
Basophils Relative: 1 %
Eosinophils Absolute: 0.2 10*3/uL (ref 0.0–0.5)
Eosinophils Relative: 4 %
HCT: 33 % — ABNORMAL LOW (ref 39.0–52.0)
Hemoglobin: 10.2 g/dL — ABNORMAL LOW (ref 13.0–17.0)
Immature Granulocytes: 0 %
Lymphocytes Relative: 16 %
Lymphs Abs: 0.8 10*3/uL (ref 0.7–4.0)
MCH: 30.1 pg (ref 26.0–34.0)
MCHC: 30.9 g/dL (ref 30.0–36.0)
MCV: 97.3 fL (ref 80.0–100.0)
Monocytes Absolute: 0.5 10*3/uL (ref 0.1–1.0)
Monocytes Relative: 10 %
Neutro Abs: 3.4 10*3/uL (ref 1.7–7.7)
Neutrophils Relative %: 69 %
Platelets: 160 10*3/uL (ref 150–400)
RBC: 3.39 MIL/uL — ABNORMAL LOW (ref 4.22–5.81)
RDW: 13.2 % (ref 11.5–15.5)
WBC: 5 10*3/uL (ref 4.0–10.5)
nRBC: 0 % (ref 0.0–0.2)

## 2018-09-11 LAB — URINALYSIS, ROUTINE W REFLEX MICROSCOPIC
Bacteria, UA: NONE SEEN
Bilirubin Urine: NEGATIVE
Glucose, UA: NEGATIVE mg/dL
Hgb urine dipstick: NEGATIVE
Ketones, ur: NEGATIVE mg/dL
Leukocytes,Ua: NEGATIVE
Nitrite: NEGATIVE
Protein, ur: 100 mg/dL — AB
Specific Gravity, Urine: 1.013 (ref 1.005–1.030)
pH: 5 (ref 5.0–8.0)

## 2018-09-11 LAB — CBG MONITORING, ED
Glucose-Capillary: 159 mg/dL — ABNORMAL HIGH (ref 70–99)
Glucose-Capillary: 163 mg/dL — ABNORMAL HIGH (ref 70–99)

## 2018-09-11 LAB — COMPREHENSIVE METABOLIC PANEL
ALT: 21 U/L (ref 0–44)
AST: 22 U/L (ref 15–41)
Albumin: 3.2 g/dL — ABNORMAL LOW (ref 3.5–5.0)
Alkaline Phosphatase: 76 U/L (ref 38–126)
Anion gap: 9 (ref 5–15)
BUN: 75 mg/dL — ABNORMAL HIGH (ref 8–23)
CO2: 26 mmol/L (ref 22–32)
Calcium: 9 mg/dL (ref 8.9–10.3)
Chloride: 111 mmol/L (ref 98–111)
Creatinine, Ser: 4.3 mg/dL — ABNORMAL HIGH (ref 0.61–1.24)
GFR calc Af Amer: 15 mL/min — ABNORMAL LOW (ref >60)
GFR calc non Af Amer: 13 mL/min — ABNORMAL LOW (ref >60)
Glucose, Bld: 191 mg/dL — ABNORMAL HIGH (ref 70–99)
Potassium: 4.2 mmol/L (ref 3.5–5.1)
Sodium: 146 mmol/L — ABNORMAL HIGH (ref 135–145)
Total Bilirubin: 0.5 mg/dL (ref 0.3–1.2)
Total Protein: 6.2 g/dL — ABNORMAL LOW (ref 6.5–8.1)

## 2018-09-11 LAB — TROPONIN I (HIGH SENSITIVITY)
Troponin I (High Sensitivity): 8 ng/L (ref <18)
Troponin I (High Sensitivity): 8 ng/L (ref <18)

## 2018-09-11 MED ORDER — NEPRO/CARBSTEADY PO LIQD: 237.0000 mL | Freq: Every day | ORAL | Status: DC | PRN

## 2018-09-11 MED ORDER — ACETAMINOPHEN 500 MG PO TABS: 500.0000 mg | ORAL_TABLET | Freq: Two times a day (BID) | ORAL | Status: DC | PRN

## 2018-09-11 MED ORDER — HYDRALAZINE HCL 20 MG/ML IJ SOLN
5.0000 mg | Freq: Once | INTRAMUSCULAR | Status: AC
  Administered 2018-09-11: 5 mg via INTRAVENOUS
  Filled 2018-09-11: qty 1

## 2018-09-11 MED ORDER — ISOSORB DINITRATE-HYDRALAZINE 20-37.5 MG PO TABS
1.0000 | ORAL_TABLET | Freq: Two times a day (BID) | ORAL | Status: DC
  Administered 2018-09-11 – 2018-09-14 (×6): 1 via ORAL
  Filled 2018-09-11 (×6): qty 1

## 2018-09-11 MED ORDER — POLYETHYLENE GLYCOL 3350 17 G PO PACK: 17.0000 g | PACK | Freq: Every day | ORAL | Status: DC | PRN

## 2018-09-11 MED ORDER — TIMOLOL HEMIHYDRATE 0.5 % OP SOLN: 1.0000 [drp] | Freq: Two times a day (BID) | OPHTHALMIC | Status: DC

## 2018-09-11 MED ORDER — SODIUM BICARBONATE 650 MG PO TABS
650.0000 mg | ORAL_TABLET | Freq: Two times a day (BID) | ORAL | Status: DC
  Administered 2018-09-11 – 2018-09-14 (×6): 650 mg via ORAL
  Filled 2018-09-11 (×6): qty 1

## 2018-09-11 MED ORDER — CLOPIDOGREL BISULFATE 75 MG PO TABS
75.0000 mg | ORAL_TABLET | Freq: Every day | ORAL | Status: DC
  Administered 2018-09-12 – 2018-09-14 (×3): 75 mg via ORAL
  Filled 2018-09-11 (×3): qty 1

## 2018-09-11 MED ORDER — LATANOPROST 0.005 % OP SOLN
1.0000 [drp] | Freq: Every day | OPHTHALMIC | Status: DC
  Administered 2018-09-12 – 2018-09-13 (×2): 1 [drp] via OPHTHALMIC
  Filled 2018-09-11: qty 2.5

## 2018-09-11 MED ORDER — INSULIN ASPART 100 UNIT/ML ~~LOC~~ SOLN
0.0000 [IU] | Freq: Three times a day (TID) | SUBCUTANEOUS | Status: DC
  Administered 2018-09-12: 3 [IU] via SUBCUTANEOUS
  Administered 2018-09-12: 1 [IU] via SUBCUTANEOUS
  Administered 2018-09-13: 2 [IU] via SUBCUTANEOUS
  Administered 2018-09-13 – 2018-09-14 (×2): 1 [IU] via SUBCUTANEOUS

## 2018-09-11 MED ORDER — TIMOLOL MALEATE 0.5 % OP SOLN
1.0000 [drp] | Freq: Two times a day (BID) | OPHTHALMIC | Status: DC
  Administered 2018-09-12 – 2018-09-14 (×5): 1 [drp] via OPHTHALMIC
  Filled 2018-09-11: qty 5

## 2018-09-11 MED ORDER — MEMANTINE HCL 10 MG PO TABS
10.0000 mg | ORAL_TABLET | Freq: Two times a day (BID) | ORAL | Status: DC
  Administered 2018-09-11 – 2018-09-14 (×6): 10 mg via ORAL
  Filled 2018-09-11 (×6): qty 1

## 2018-09-11 MED ORDER — ATORVASTATIN CALCIUM 10 MG PO TABS
20.0000 mg | ORAL_TABLET | Freq: Every evening | ORAL | Status: DC
  Administered 2018-09-11 – 2018-09-13 (×3): 20 mg via ORAL
  Filled 2018-09-11 (×3): qty 2

## 2018-09-11 MED ORDER — SODIUM CHLORIDE 0.9 % IV SOLN
INTRAVENOUS | Status: DC
  Administered 2018-09-11: 22:00:00 via INTRAVENOUS

## 2018-09-11 MED ORDER — VENLAFAXINE HCL ER 75 MG PO CP24
75.0000 mg | ORAL_CAPSULE | Freq: Every day | ORAL | Status: DC
  Administered 2018-09-12 – 2018-09-14 (×3): 75 mg via ORAL
  Filled 2018-09-11 (×3): qty 1

## 2018-09-11 MED ORDER — OXYBUTYNIN CHLORIDE ER 5 MG PO TB24
5.0000 mg | ORAL_TABLET | Freq: Every morning | ORAL | Status: DC
  Administered 2018-09-12 – 2018-09-14 (×3): 5 mg via ORAL
  Filled 2018-09-11 (×3): qty 1

## 2018-09-11 MED ORDER — CARVEDILOL 6.25 MG PO TABS
6.2500 mg | ORAL_TABLET | Freq: Two times a day (BID) | ORAL | Status: DC
  Administered 2018-09-12 – 2018-09-14 (×5): 6.25 mg via ORAL
  Filled 2018-09-11 (×5): qty 1

## 2018-09-11 MED ORDER — NEPRO PO LIQD: 237.0000 mL | Freq: Every day | ORAL | Status: DC | PRN

## 2018-09-11 MED ORDER — SILVER SULFADIAZINE 1 % EX CREA
1.0000 "application " | TOPICAL_CREAM | Freq: Every day | CUTANEOUS | Status: DC | PRN
  Filled 2018-09-11: qty 85

## 2018-09-11 MED ORDER — TAMSULOSIN HCL 0.4 MG PO CAPS
0.4000 mg | ORAL_CAPSULE | Freq: Every day | ORAL | Status: DC
  Administered 2018-09-12 – 2018-09-13 (×2): 0.4 mg via ORAL
  Filled 2018-09-11 (×2): qty 1

## 2018-09-11 MED ORDER — MIRTAZAPINE 15 MG PO TABS
15.0000 mg | ORAL_TABLET | Freq: Every day | ORAL | Status: DC
  Administered 2018-09-11 – 2018-09-13 (×3): 15 mg via ORAL
  Filled 2018-09-11 (×3): qty 1

## 2018-09-11 MED ORDER — LAMOTRIGINE 25 MG PO TABS
150.0000 mg | ORAL_TABLET | Freq: Two times a day (BID) | ORAL | Status: DC
  Administered 2018-09-11 – 2018-09-14 (×6): 150 mg via ORAL
  Filled 2018-09-11 (×6): qty 2

## 2018-09-11 MED ORDER — FINASTERIDE 5 MG PO TABS
5.0000 mg | ORAL_TABLET | Freq: Every day | ORAL | Status: DC
  Administered 2018-09-12 – 2018-09-14 (×3): 5 mg via ORAL
  Filled 2018-09-11 (×3): qty 1

## 2018-09-11 MED ORDER — GABAPENTIN 300 MG PO CAPS
300.0000 mg | ORAL_CAPSULE | Freq: Every day | ORAL | Status: DC
  Administered 2018-09-11 – 2018-09-13 (×3): 300 mg via ORAL
  Filled 2018-09-11 (×3): qty 1

## 2018-09-11 MED ORDER — HEPARIN SODIUM (PORCINE) 5000 UNIT/ML IJ SOLN
5000.0000 [IU] | Freq: Three times a day (TID) | INTRAMUSCULAR | Status: DC
  Administered 2018-09-11 – 2018-09-14 (×8): 5000 [IU] via SUBCUTANEOUS
  Filled 2018-09-11 (×8): qty 1

## 2018-09-11 MED ORDER — LIDOCAINE 5 % EX PTCH
1.0000 | MEDICATED_PATCH | CUTANEOUS | Status: DC
  Administered 2018-09-11 – 2018-09-12 (×2): 1 via TRANSDERMAL
  Filled 2018-09-11 (×3): qty 1

## 2018-09-11 MED ORDER — DOCUSATE SODIUM 100 MG PO CAPS
100.0000 mg | ORAL_CAPSULE | Freq: Two times a day (BID) | ORAL | Status: DC
  Administered 2018-09-11 – 2018-09-14 (×6): 100 mg via ORAL
  Filled 2018-09-11 (×6): qty 1

## 2018-09-11 MED ORDER — LIDOCAINE 4 % EX PTCH: MEDICATED_PATCH | Freq: Every day | CUTANEOUS | Status: DC | PRN

## 2018-09-11 NOTE — Telephone Encounter (Signed)
Returned call to wife [name] who reports that patient was transported to the ER today due to unresponsiveness and staring this AM while reclining in the bed.  He had a hypoglycemic episode earlier this AM (in the 40s) that she was able to correct with a return to glucose level of 147.  The unresponsiveness occurred after the hypoglycemia was corrected.  The original reason for her call today, per Mrs. Hochmuth, is to request in home assistance for Mr. Beeks.  She reports that he is falling more frequently and she is unable to assist him up due to her own health issues.  He continues to have agitation and varied nutritional intake.  Haldol that was previously prescribed due to dementia with behaviors and agitation has been discontinued and replaced with an alternate medication that she cannot recall the name of currently.  She reports understanding that his health is declining but she does not want him placed in a facility due to the COVID-19 crisis.  She is not sure at this time if she could afford payment to a private company for caregiving duties.  Palliative will have to investigate this further and we have again discussed his potential eligibility for Hospice Care.  I did recommend inpatient Palliative Care to continue discussion with wife and we will have our liason attempt to arrange same.  Nurse navigator Maxwell Caul, RN was informed of above updates and will contact liason.  Additional followup with wife is necessary pending further evaluations.      Gonzella Lex, NP-C

## 2018-09-11 NOTE — Telephone Encounter (Signed)
I reached out to the pt and connected with his daughter. Appt has been scheduled for 12/18/18 at 1100 am, check in time of 10:30.

## 2018-09-11 NOTE — ED Notes (Signed)
Dr. Jamse Arn called to advise that the [facility] sensor on abd will need to be taken off for the MRI per Radiology.  This nurse advised wife.

## 2018-09-11 NOTE — Progress Notes (Signed)
Manufacturing engineer [facility]) Hospital Liaison note.   This patient is active with [place] services. [place] will follow while patient is in ED and if admitted.   Please call with any Palliative or Hospice related questions or concerns.  Thank you, Farrel Gordon, RN, CCM [place] (listed on Central) [phone]

## 2018-09-11 NOTE — Plan of Care (Signed)
  Problem: Education: Goal: Knowledge of General Education information will improve Description: Including pain rating scale, medication(s)/side effects and non-pharmacologic comfort measures Outcome: Progressing   Problem: Clinical Measurements: Goal: Diagnostic test results will improve Outcome: Progressing   Problem: Clinical Measurements: Goal: Respiratory complications will improve Outcome: Progressing   Problem: Coping: Goal: Level of anxiety will decrease Outcome: Progressing   Problem: Elimination: Goal: Will not experience complications related to urinary retention Outcome: Progressing   Problem: Pain Managment: Goal: General experience of comfort will improve Outcome: Progressing
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
## 2018-09-11 NOTE — Progress Notes (Signed)
Spoke with Dr. Jamse Arn who said it was acceptable to wait until pt Covid-19 test returned from the lab.  Will plan to obtain MRI after results are received.

## 2018-09-11 NOTE — ED Triage Notes (Signed)
To room via EMS.  Family told EMS that pt is more altered than usual, onset last night.  Pt BS 40's, family "corrected", family did not tell EMS>   CBG 171.  Per EMS pt opens eyes on own sometimes, responds to painful stimuli. Per Family pt is verbal sometimes.  EMS reports family would not elaborate.   BP 170/70 HR 70 Temp 98.2 RR 12 SpO2 90%, was placed on non-rebreather.

## 2018-09-12 ENCOUNTER — Inpatient Hospital Stay (HOSPITAL_COMMUNITY): Payer: Medicare Other

## 2018-09-12 DIAGNOSIS — E1122 Type 2 diabetes mellitus with diabetic chronic kidney disease: Secondary | ICD-10-CM

## 2018-09-12 DIAGNOSIS — N184 Chronic kidney disease, stage 4 (severe): Secondary | ICD-10-CM

## 2018-09-12 DIAGNOSIS — I10 Essential (primary) hypertension: Secondary | ICD-10-CM

## 2018-09-12 DIAGNOSIS — R404 Transient alteration of awareness: Secondary | ICD-10-CM

## 2018-09-12 DIAGNOSIS — R413 Other amnesia: Secondary | ICD-10-CM

## 2018-09-12 DIAGNOSIS — Z515 Encounter for palliative care: Secondary | ICD-10-CM

## 2018-09-12 DIAGNOSIS — G9340 Encephalopathy, unspecified: Secondary | ICD-10-CM

## 2018-09-12 DIAGNOSIS — Z794 Long term (current) use of insulin: Secondary | ICD-10-CM

## 2018-09-12 DIAGNOSIS — G40909 Epilepsy, unspecified, not intractable, without status epilepticus: Principal | ICD-10-CM

## 2018-09-12 DIAGNOSIS — F0150 Vascular dementia without behavioral disturbance: Secondary | ICD-10-CM

## 2018-09-12 LAB — CBC WITH DIFFERENTIAL/PLATELET
Basophils Absolute: 0.1 10*3/uL (ref 0.0–0.2)
Basos: 1 %
EOS (ABSOLUTE): 0.2 10*3/uL (ref 0.0–0.4)
Eos: 3 %
Hematocrit: 34.3 % — ABNORMAL LOW (ref 37.5–51.0)
Hemoglobin: 10.9 g/dL — ABNORMAL LOW (ref 13.0–17.7)
Immature Grans (Abs): 0 10*3/uL (ref 0.0–0.1)
Immature Granulocytes: 0 %
Lymphocytes Absolute: 0.7 10*3/uL (ref 0.7–3.1)
Lymphs: 13 %
MCH: 29 pg (ref 26.6–33.0)
MCHC: 31.8 g/dL (ref 31.5–35.7)
MCV: 91 fL (ref 79–97)
Monocytes Absolute: 0.4 10*3/uL (ref 0.1–0.9)
Monocytes: 8 %
Neutrophils Absolute: 3.9 10*3/uL (ref 1.4–7.0)
Neutrophils: 75 %
Platelets: 200 10*3/uL (ref 150–450)
RBC: 3.76 x10E6/uL — ABNORMAL LOW (ref 4.14–5.80)
RDW: 12.3 % (ref 11.6–15.4)
WBC: 5.2 10*3/uL (ref 3.4–10.8)

## 2018-09-12 LAB — CBC
HCT: 34.5 % — ABNORMAL LOW (ref 39.0–52.0)
Hemoglobin: 10.7 g/dL — ABNORMAL LOW (ref 13.0–17.0)
MCH: 29.6 pg (ref 26.0–34.0)
MCHC: 31 g/dL (ref 30.0–36.0)
MCV: 95.6 fL (ref 80.0–100.0)
Platelets: 155 10*3/uL (ref 150–400)
RBC: 3.61 MIL/uL — ABNORMAL LOW (ref 4.22–5.81)
RDW: 13 % (ref 11.5–15.5)
WBC: 5.1 10*3/uL (ref 4.0–10.5)
nRBC: 0 % (ref 0.0–0.2)

## 2018-09-12 LAB — COMPREHENSIVE METABOLIC PANEL
ALT: 19 IU/L (ref 0–44)
ALT: 23 U/L (ref 0–44)
AST: 19 IU/L (ref 0–40)
AST: 22 U/L (ref 15–41)
Albumin/Globulin Ratio: 1.6 (ref 1.2–2.2)
Albumin: 4.1 g/dL (ref 3.7–4.7)
Albumin: 3.3 g/dL — ABNORMAL LOW (ref 3.5–5.0)
Alkaline Phosphatase: 85 IU/L (ref 39–117)
Alkaline Phosphatase: 73 U/L (ref 38–126)
Anion gap: 10 (ref 5–15)
BUN/Creatinine Ratio: 19 (ref 10–24)
BUN: 69 mg/dL — ABNORMAL HIGH (ref 8–27)
BUN: 70 mg/dL — ABNORMAL HIGH (ref 8–23)
Bilirubin Total: <0.2 mg/dL (ref 0.0–1.2)
CO2: 22 mmol/L (ref 20–29)
CO2: 26 mmol/L (ref 22–32)
Calcium: 9.4 mg/dL (ref 8.6–10.2)
Calcium: 9.1 mg/dL (ref 8.9–10.3)
Chloride: 108 mmol/L — ABNORMAL HIGH (ref 96–106)
Chloride: 108 mmol/L (ref 98–111)
Creatinine, Ser: 3.58 mg/dL — ABNORMAL HIGH (ref 0.76–1.27)
Creatinine, Ser: 4.07 mg/dL — ABNORMAL HIGH (ref 0.61–1.24)
GFR calc Af Amer: 19 mL/min/{1.73_m2} — ABNORMAL LOW (ref >59)
GFR calc Af Amer: 16 mL/min — ABNORMAL LOW (ref >60)
GFR calc non Af Amer: 16 mL/min/{1.73_m2} — ABNORMAL LOW (ref >59)
GFR calc non Af Amer: 14 mL/min — ABNORMAL LOW (ref >60)
Globulin, Total: 2.5 g/dL (ref 1.5–4.5)
Glucose, Bld: 210 mg/dL — ABNORMAL HIGH (ref 70–99)
Glucose: 180 mg/dL — ABNORMAL HIGH (ref 65–99)
Potassium: 4.7 mmol/L (ref 3.5–5.2)
Potassium: 4.4 mmol/L (ref 3.5–5.1)
Sodium: 145 mmol/L — ABNORMAL HIGH (ref 134–144)
Sodium: 144 mmol/L (ref 135–145)
Total Bilirubin: 0.3 mg/dL (ref 0.3–1.2)
Total Protein: 6.6 g/dL (ref 6.0–8.5)
Total Protein: 6.2 g/dL — ABNORMAL LOW (ref 6.5–8.1)

## 2018-09-12 LAB — GLUCOSE, CAPILLARY
Glucose-Capillary: 145 mg/dL — ABNORMAL HIGH (ref 70–99)
Glucose-Capillary: 161 mg/dL — ABNORMAL HIGH (ref 70–99)
Glucose-Capillary: 216 mg/dL — ABNORMAL HIGH (ref 70–99)
Glucose-Capillary: 263 mg/dL — ABNORMAL HIGH (ref 70–99)
Glucose-Capillary: 84 mg/dL (ref 70–99)

## 2018-09-12 LAB — LAMOTRIGINE LEVEL: Lamotrigine Lvl: 8 ug/mL (ref 2.0–20.0)

## 2018-09-12 LAB — AMMONIA: Ammonia: 31 ug/dL (ref 31–169)

## 2018-09-12 LAB — NOVEL CORONAVIRUS, NAA (HOSP ORDER, SEND-OUT TO REF LAB; TAT 18-24 HRS): SARS-CoV-2, NAA: NOT DETECTED

## 2018-09-12 NOTE — Progress Notes (Signed)
Per wife, patient's stomach is distended.  It is normally flat.  Please assess.

## 2018-09-12 NOTE — Procedures (Signed)
EEG Report  Clinical History:  Altered mental status  Technical Summary:  A 19 channel digital EEG recording was performed using the 10-20 international system of electrode placement.  Bipolar and Referential montages were used.  The total recording time was approx 20 minutes.  Findings:  There is no posterior dominant rhythm.  Background frequencies are around 6 Hz and symmetrical during wakefulness.   No focal slowing is present.  He does get drowsy and enters stage N2 sleep.  There are no epileptiform discharges or electrographic seizures present.   Impression:  This is an abnormal EEG.  There is generalized moderate slowing of brain activity which is non-specific but may be related dementia, toxic metabolic, or infectious etiologies.  Clinical correlation is recommended.   Rogue Jury, MS, MD

## 2018-09-12 NOTE — Progress Notes (Signed)
Assisted patient with lunch.  He ate about 75% of his meal.  He had good appetite and needed assistance

## 2018-09-12 NOTE — Progress Notes (Signed)
PROGRESS NOTE    [name].  GYI:[id number] DOB: 1946-02-16 DOA: 09/11/2018 PCP: Deland Pretty, MD   Brief Narrative:  HPI On 09/11/2018 by Dr. Vikki Ports. is an 73 y.o. male with past medical history significant for dementia, seizure, disorder, previous strokes, unspecified encephalopathy, hypertension, diabetes complicated by osteomyelitis and stage IV renal failure and mixed CHF who is now brought in by his wife for transient alteration in consciousness.  Patient apparently has had slow decline over the past month with less ability to do his usual ADLs.  Over the past 2 weeks he apparently has stopped feeding himself and has stopped watching TV and he has been increasingly somnolent.  This morning patient was noted to be somewhat stuporous and his blood sugar was noted to be 44 and treated with food and glucose tablets with normalization of blood sugar.  However when patient was lying in bed patient's wife noted that he was staring off into space and then "he went out".  He apparently was unresponsive for a period of time during which EMS was called.  Apparently patient was still unresponsive when EMS brought patient to ED however he has slowly returned to his previous level of consciousness.  Of note this episode sounds almost exactly like description of seizures per Dr. Tobey Grim note from 07/23/2018: "The patient may lose consciousness for 3 to 5 minutes, he does not stiffen or jerk, he may twitch slightly with arms. He recalls nothing of the event."  Patient's wife notes that she does not believe that he has had a fever or chills or sweats.  He has not complained of any particular pain.  She is just noted that he has been more somnolent over the past 2 weeks.  As noted above she has started to feed him as he is seemingly unwilling/unable to feed himself which he had been doing to weeks previously. Assessment & Plan   Transient alteration in consciousness -CT  head: No acute intercranial normality.  Old left occipital infarct.  Old ischemic changes in the right basal ganglia region. -Possibly seizure -Patient does have progressive dementia -Case was discussed with neurology on admission, Dr. Lorraine Lax, recommended obtaining MRI and EEG -MRI brain cannot be done until COVID testing is back -Pending EEG -UA and chest x-ray unremarkable for infection -Ammonia 31 (WNL) -Of note, patient does have history of seizure disorder, and his lamotrigine was recently increased as an outpatient (per neurology note from 09/10/2018) -Continue to monitor  Seizure disorder -Continue lamotrigine and gabapentin -Pending EEG  Cerbrovascular disease -Continue aspirin, Plavix, statin -Pending MRI  Chronic kidney disease, stage IV -Creatinine within stage 4 -Monitor BMP  Chronic combined systolic and diastolic CHF -Patient appears to be euvolemic -Lasix held -Continue Coreg  Essential hypertension  Diabetes mellitus, type II -Patient did have hypoglycemic episode prior to admission -As an outpatient, he is on glargine 4 units at bedtime as well as sliding scale lispro -Continue insulin sliding scale and CBG monitoring  Dementia -Per outpatient neurology note on 09/10/2018, patient has progressive dementia -Continue memantine, mirtazapine, venlafaxine  Poor oral intake/ malnutrition -BMI 18 -Per HPI, patient has had poor oral intake -Continue IVF -will consult nutrition  Goals of care -Palliative care consulted and appreciated -Patient made DNR -He was being followed by palliative care services prior to admission.  Patient will likely need to be followed by hospice upon discharge.  DVT Prophylaxis  heparin  Code Status: DNR  Family Communication: None at  bedside  Disposition Plan: Admitted. Pending workup for AMS.   Consultants Palliative care Neurology, via phone, per H&P  Procedures  None  Antibiotics   Anti-infectives (From admission,  onward)   None      Subjective:   [name] seen and examined today.  Patient has no complaints this morning. Thinks he is in [facility]. (patient with history of dementia)  Objective:   Vitals:   09/11/18 1850 09/11/18 1930 09/11/18 2037 09/12/18 0443  BP: (!) 197/82 (!) 187/82 (!) 207/80 (!) 178/64  Pulse:   61 64  Resp: 14 12 16 10   Temp:   97.8 F (36.6 C) 97.7 F (36.5 C)  TempSrc:   Axillary Oral  SpO2:  98% 98% 100%  Weight:   59 kg   Height:   5\' 10"  (1.778 m)     Intake/Output Summary (Last 24 hours) at 09/12/2018 1252 Last data filed at 09/12/2018 2706 Gross per 24 hour  Intake 600 ml  Output 1025 ml  Net -425 ml   Filed Weights   09/11/18 2037  Weight: 59 kg    Exam  General: Well developed, thin, elderly, chronically ill appearing, NAD  HEENT: NCAT, mucous membranes moist.   Cardiovascular: S1 S2 auscultated, RRR  Respiratory: Clear to auscultation bilaterally  Abdomen: Soft, nontender, nondistended, + bowel sounds  Extremities: warm dry without cyanosis clubbing or edema  Neuro: AAOx1 (self only), moving all extremities  Psych: Pleasantly confused   Data Reviewed: I have personally reviewed following labs and imaging studies  CBC: Recent Labs  Lab 09/10/18 1529 09/11/18 1305 09/12/18 0248  WBC 5.2 5.0 5.1  NEUTROABS 3.9 3.4  --   HGB 10.9* 10.2* 10.7*  HCT 34.3* 33.0* 34.5*  MCV 91 97.3 95.6  PLT 200 160 237   Basic Metabolic Panel: Recent Labs  Lab 09/10/18 1529 09/11/18 1305 09/12/18 0248  NA 145* 146* 144  K 4.7 4.2 4.4  CL 108* 111 108  CO2 22 26 26   GLUCOSE 180* 191* 210*  BUN 69* 75* 70*  CREATININE 3.58* 4.30* 4.07*  CALCIUM 9.4 9.0 9.1   GFR: Estimated Creatinine Clearance: 13.9 mL/min (A) (by C-G formula based on SCr of 4.07 mg/dL (H)). Liver Function Tests: Recent Labs  Lab 09/10/18 1529 09/11/18 1305 09/12/18 0248  AST 19 22 22   ALT 19 21 23   ALKPHOS 85 76 73  BILITOT <0.2 0.5 0.3  PROT  6.6 6.2* 6.2*  ALBUMIN 4.1 3.2* 3.3*   No results for input(s): LIPASE, AMYLASE in the last 168 hours. Recent Labs  Lab 09/10/18 1529  AMMONIA 31   Coagulation Profile: No results for input(s): INR, PROTIME in the last 168 hours. Cardiac Enzymes: No results for input(s): CKTOTAL, CKMB, CKMBINDEX, TROPONINI in the last 168 hours. BNP (last 3 results) No results for input(s): PROBNP in the last 8760 hours. HbA1C: No results for input(s): HGBA1C in the last 72 hours. CBG: Recent Labs  Lab 09/11/18 1154 09/11/18 1552 09/12/18 0049 09/12/18 0440 09/12/18 0758  GLUCAP 159* 163* 263* 161* 145*   Lipid Profile: No results for input(s): CHOL, HDL, LDLCALC, TRIG, CHOLHDL, LDLDIRECT in the last 72 hours. Thyroid Function Tests: No results for input(s): TSH, T4TOTAL, FREET4, T3FREE, THYROIDAB in the last 72 hours. Anemia Panel: No results for input(s): VITAMINB12, FOLATE, FERRITIN, TIBC, IRON, RETICCTPCT in the last 72 hours. Urine analysis:    Component Value Date/Time   COLORURINE YELLOW 09/11/2018 [place] 09/11/2018 1520  LABSPEC 1.013 09/11/2018 1520   PHURINE 5.0 09/11/2018 1520   GLUCOSEU NEGATIVE 09/11/2018 1520   HGBUR NEGATIVE 09/11/2018 [place] 09/11/2018 [place] 09/11/2018 1520   PROTEINUR 100 (A) 09/11/2018 1520   UROBILINOGEN 0.2 12/07/2012 1140   NITRITE NEGATIVE 09/11/2018 1520   LEUKOCYTESUR NEGATIVE 09/11/2018 1520   Sepsis Labs: @LABRCNTIP (procalcitonin:4,lacticidven:4)  )No results found for this or any previous visit (from the past 240 hour(s)).    Radiology Studies: Dg Chest 2 View  Result Date: 09/11/2018 CLINICAL DATA:  Syncope, chest pain, shortness of breath, altered level of consciousness EXAM: CHEST - 2 VIEW COMPARISON:  06/13/2018 FINDINGS: Normal heart size, mediastinal contours, and pulmonary vascularity. Lungs clear. No infiltrate, pleural effusion or pneumothorax. Bones  demineralized. IMPRESSION: No acute abnormalities. Electronically Signed   By: Lavonia Dana M.D.   On: 09/11/2018 17:36   Ct Head Wo Contrast  Result Date: 09/11/2018 CLINICAL DATA:  73 year old with syncope. EXAM: CT HEAD WITHOUT CONTRAST TECHNIQUE: Contiguous axial images were obtained from the base of the skull through the vertex without intravenous contrast. COMPARISON:  06/13/2018 FINDINGS: Brain: Old infarct involving the left occipital lobe. Extensive low-density in the subcortical and periventricular white matter. Evidence for old ischemic changes in the right basal ganglia and right internal capsule. Negative for acute hemorrhage, mass lesion, midline shift, hydrocephalus or new large infarct. Vascular: No hyperdense vessel or unexpected calcification. Skull: Normal. Negative for fracture or focal lesion. Sinuses/Orbits: Minimal mucosal thickening in the paranasal sinuses. Other: None IMPRESSION: 1. No acute intracranial abnormality. 2. Old left occipital infarct. Old ischemic changes in the right basal ganglia region. 3. Extensive chronic white matter disease which may represent chronic small vessel ischemic changes. Electronically Signed   By: Markus Daft M.D.   On: 09/11/2018 14:28     Scheduled Meds: . atorvastatin  20 mg Oral QPM  . carvedilol  6.25 mg Oral BID WC  . clopidogrel  75 mg Oral Daily  . docusate sodium  100 mg Oral BID  . finasteride  5 mg Oral Daily  . gabapentin  300 mg Oral QHS  . heparin  5,000 Units Subcutaneous Q8H  . insulin aspart  0-9 Units Subcutaneous TID WC  . isosorbide-hydrALAZINE  1 tablet Oral BID  . lamoTRIgine  150 mg Oral BID  . latanoprost  1 drop Both Eyes QHS  . lidocaine  1 patch Transdermal Q24H  . memantine  10 mg Oral BID  . mirtazapine  15 mg Oral QHS  . oxybutynin  5 mg Oral q morning - 10a  . sodium bicarbonate  650 mg Oral BID  . tamsulosin  0.4 mg Oral QPC supper  . timolol  1 drop Both Eyes BID  . venlafaxine XR  75 mg Oral Q  breakfast   Continuous Infusions: . sodium chloride 50 mL/hr at 09/11/18 2133     LOS: 1 day   Time Spent in minutes   30 minutes  Olen Eaves D.O. on 09/12/2018 at 12:52 PM  Between 7am to 7pm - Please see pager noted on [email or web address]  After 7pm go to [email or web address]  And look for the night coverage person covering for me after hours  [facility] Office  [phone]

## 2018-09-12 NOTE — Progress Notes (Signed)
EEG Complete  Results Pending 

## 2018-09-12 NOTE — Progress Notes (Signed)
Spoke with Wife Virgil Lightner and provided update on patient
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
## 2018-09-13 ENCOUNTER — Inpatient Hospital Stay (HOSPITAL_COMMUNITY): Payer: Medicare Other

## 2018-09-13 DIAGNOSIS — E162 Hypoglycemia, unspecified: Secondary | ICD-10-CM

## 2018-09-13 LAB — BASIC METABOLIC PANEL
Anion gap: 9 (ref 5–15)
BUN: 63 mg/dL — ABNORMAL HIGH (ref 8–23)
CO2: 25 mmol/L (ref 22–32)
Calcium: 9 mg/dL (ref 8.9–10.3)
Chloride: 110 mmol/L (ref 98–111)
Creatinine, Ser: 3.52 mg/dL — ABNORMAL HIGH (ref 0.61–1.24)
GFR calc Af Amer: 19 mL/min — ABNORMAL LOW (ref >60)
GFR calc non Af Amer: 16 mL/min — ABNORMAL LOW (ref >60)
Glucose, Bld: 95 mg/dL (ref 70–99)
Potassium: 4.4 mmol/L (ref 3.5–5.1)
Sodium: 144 mmol/L (ref 135–145)

## 2018-09-13 LAB — GLUCOSE, CAPILLARY
Glucose-Capillary: 103 mg/dL — ABNORMAL HIGH (ref 70–99)
Glucose-Capillary: 137 mg/dL — ABNORMAL HIGH (ref 70–99)
Glucose-Capillary: 160 mg/dL — ABNORMAL HIGH (ref 70–99)
Glucose-Capillary: 200 mg/dL — ABNORMAL HIGH (ref 70–99)

## 2018-09-13 MED ORDER — HYDRALAZINE HCL 20 MG/ML IJ SOLN
10.0000 mg | Freq: Four times a day (QID) | INTRAMUSCULAR | Status: DC | PRN
  Administered 2018-09-13: 10 mg via INTRAVENOUS
  Filled 2018-09-13: qty 1

## 2018-09-13 NOTE — Progress Notes (Signed)
Daily Progress Note   Patient Name: [name].       Date: 09/13/2018 DOB: Feb 29, 1946  Age: 73 y.o. MRN#: [id number] Attending Physician: Cristal Ford, DO Primary Care Physician: Deland Pretty, MD Admit Date: 09/11/2018  Reason for Consultation/Follow-up: Establishing goals of care  Subjective: I examined [name] today he is alert and attempting to eat but unable to manipulate a fork to his mouth.  He told me "I"ve done something stupid today I bought a car and drove it on the 5th floor of the hospital"  Then he asked me to feed him peaches.  I conferred with Dr. Ree Kida then spoke with Algis Downs.  I spoke with [name] about the MRI and EEG results.  Nothing showed on either test that would clearly explain his episode of unresponsiveness.  We discussed progressive CKD stage 4 as well as progressive dementia. I discussed the hospice philosophy with [name].  Daekwon is DNR. The family does not want hemodialysis.  She is overwhelmed caring for him and his medical needs.  [name] continuously runs him to doctors appts and to the ER.  She meticulously attempts to control his sugar intake in order to "protect his kidneys".  We discussed loosening the reins and focusing on quality of life vs quantity of life.  We discussed the care Hospice offers in house.  [name] mentioned that her PCP advised her to consider Hospice services for [name]  [name] is familiar with Hospice services as both of her parents were on Hospice. At the end of our discussion [name] said "I want to talk with my daughter about it but I think I'm going to accept Hospice services".   Assessment: Patient awake, alert, unable to feed himself, delusional, pleasant.   Patient Profile/HPI: 73 y.o. male  with past medical  history of CVAs, seizure, PVD s/p left BKA, memory disorder, CKD 4, mixed heart failure, and numerous falls who was admitted on 09/11/2018 after a period of unresponsiveness.  He was found to have acute on chronic kidney disease with a creatinine of 4.3.  The admitting physician consulted neurology due to suspicion of seizure.     Length of Stay: 2  Current Medications: Scheduled Meds:  . atorvastatin  20 mg Oral QPM  . carvedilol  6.25 mg Oral BID WC  . clopidogrel  75 mg Oral Daily  . docusate sodium  100 mg Oral BID  . finasteride  5 mg Oral Daily  . gabapentin  300 mg Oral QHS  . heparin  5,000 Units Subcutaneous Q8H  . insulin aspart  0-9 Units Subcutaneous TID WC  . isosorbide-hydrALAZINE  1 tablet Oral BID  . lamoTRIgine  150 mg Oral BID  . latanoprost  1 drop Both Eyes QHS  . lidocaine  1 patch Transdermal Q24H  . memantine  10 mg Oral BID  . mirtazapine  15 mg Oral QHS  . oxybutynin  5 mg Oral q morning - 10a  . sodium bicarbonate  650 mg Oral BID  . tamsulosin  0.4 mg Oral QPC supper  . timolol  1 drop Both Eyes BID  . venlafaxine XR  75 mg Oral Q breakfast    Continuous Infusions: . sodium chloride 50 mL/hr at 09/11/18 2133    PRN Meds: acetaminophen, feeding supplement (NEPRO CARB STEADY), polyethylene glycol, silver sulfADIAZINE  Physical Exam       Thin male, awake, alert, NAD, deliusional CV rrr with murmur Abdomen thin, soft, nd, nt   Vital Signs: BP (!) 200/88 (BP Location: Right Arm)   Pulse 65   Temp 97.8 F (36.6 C) (Oral)   Resp 18   Ht 5\' 10"  (1.778 m)   Wt 59 kg   SpO2 100%   BMI 18.66 kg/m  SpO2: SpO2: 100 % O2 Device: O2 Device: Room Air O2 Flow Rate:    Intake/output summary:   Intake/Output Summary (Last 24 hours) at 09/13/2018 1251 Last data filed at 09/13/2018 1235 Gross per 24 hour  Intake 653.64 ml  Output -  Net 653.64 ml   LBM: Last BM Date: 09/12/18 Baseline Weight: Weight: 59 kg Most recent weight: Weight: 59 kg        Palliative Assessment/Data: 40%      Patient Active Problem List   Diagnosis Date Noted  . Vascular dementia without behavioral disturbance ([place])   . Altered mental status, unspecified 09/11/2018  . Palliative care encounter 07/16/2018  . SIRS (systemic inflammatory response syndrome) ([place]) 03/23/2018  . Gait abnormality 03/21/2018  . Chronic systolic CHF (congestive heart failure) ([place]) 12/19/2017  . Low bicarbonate level 12/19/2017  . Metabolic acidosis 03/22/7251  . Dehydration 12/19/2017  . Enteritis presumed infectious 12/18/2017  . Nausea vomiting and diarrhea 12/18/2017  . Left inguinal hernia 08/16/2017  . Idiopathic chronic venous hypertension of right lower extremity with inflammation 06/04/2017  . Protein-calorie malnutrition, severe 05/27/2017  . Adjustment disorder with depressed mood 05/26/2017  . Uremia, acute 05/25/2017  . Weakness 05/25/2017  . Dysphagia 05/25/2017  . Acute encephalopathy 05/12/2017  . Uncontrolled type 2 diabetes mellitus with hyperglycemia ([place]) 05/12/2017  . Chest pain 05/06/2017  . CVA (cerebral vascular accident) ([place]) 10/30/2016  . Lightheadedness 10/29/2016  . Syncope 07/02/2015  . Femoral neck fracture, left, closed, initial encounter 06/02/2015  . Seizure disorder ([place]) 05/24/2015  . Memory disorder 01/14/2014  . Toe osteomyelitis, right ([place]) 03/15/2013    Class: Acute  . Non-healing ulcer of lower extremity ([place]) 03/15/2013    Class: Chronic  . Acute osteomyelitis ([place]) 03/15/2013  . Cellulitis in diabetic foot ([place]) 03/14/2013  . Hyperglycemia 03/14/2013  . AKI (acute kidney injury) ([place]) 03/14/2013  . Acute systolic heart failure-EF 35%  12/03/2012  . Chronic diastolic heart failure ([place]) 12/03/2012  . CKD (chronic kidney disease) stage 4, GFR 15-29 ml/min ([facility]) 12/02/2012  . Charcot's joint  of foot 12/02/2012  . Acute respiratory failure with hypoxia ([place]) 11/29/2012  . Encephalopathy acute 11/29/2012  . Bilateral  pneumonia 11/26/2012  . Hypoglycemia 11/26/2012  . Hypothermia 11/26/2012  . Essential hypertension 11/17/2012  . Anemia 11/17/2012  . BPH (benign prostatic hyperplasia) 10/31/2012  . Right foot ulcer ([place]) 10/31/2012  . Severe sepsis with acute organ dysfunction ([place]) 10/23/2012  . DM2 (diabetes mellitus, type 2) ([place]) 10/23/2012    Palliative Care Plan    Recommendations/Plan:  Home with Hospice Services (CM please confirm 1 last time with wife).  Patient has Palliative currently with [facility].  Patient will need a hospital bed in the home prior to discharge.  Goals of Care and Additional Recommendations:  Limitations on Scope of Treatment: Avoid Hospitalization, No Artificial Feeding, No Chemotherapy, No Radiation, No Surgical Procedures and No Tracheostomy  Code Status:  DNR  Prognosis:   < 6 months CKD stg 4 -5 (GFR 15 - 19) no hemodialysis, recurrent hospitalizations, numerous falls, vascular dementia, unable to stand and walk  Discharge Planning:  Home with Hospice  Care plan was discussed with Dr. Ree Kida  Thank you for allowing the Palliative Medicine Team to assist in the care of this patient.  Total time spent:  60 min     Greater than 50%  of this time was spent counseling and coordinating care related to the above assessment and plan.  Florentina Jenny, PA-C Palliative Medicine  Please contact Palliative MedicineTeam phone at [phone] for questions and concerns between 7 am - 7 pm.   Please see AMION for individual provider pager numbers.

## 2018-09-13 NOTE — Progress Notes (Addendum)
Manufacturing engineer [facility]) Hospice  Received request from [facility] for patient/family interest in hospice services at home after discharge. Chart and patient information under review by [facility] physician. Hospice eligibility has been confirmed by [facility] physician.    Spoke with spouse by phone to confirm interest, explain services and answer questions. Per discussion plan is for patient to return home once DME is delivered. Per request of spouse, hospital bed, over bed table and BSC ordered through [employer] for delivery to home. Spouse [name] is contact to coordinate with [employer].   [facility] aware of above and will contact spouse to arrange initial visit in the home.   [facility] will continue to follow daily until discharge.   Please send home with patient signed and completed out of facility DNR.   Please make [facility] aware when patient is ready for discharge by contacting hospital liaison listed on AMION or calling [phone] after hours.   Thank you,  Heloise Purpura [phone]  [facility] hospital liaisons listed daily on AMION under Hospice and [place]

## 2018-09-13 NOTE — Progress Notes (Addendum)
Initial Nutrition Assessment  DOCUMENTATION CODES:   Severe malnutrition in context of chronic illness  INTERVENTION:   -Magic cup TID with meals, each supplement provides 290 kcal and 9 grams of protein -Feeding assistance with meals  NUTRITION DIAGNOSIS:   Severe Malnutrition related to chronic illness(CHF, CKD, dementia) as evidenced by moderate fat depletion, severe fat depletion, moderate muscle depletion, severe muscle depletion.  GOAL:   Patient will meet greater than or equal to 90% of their needs  MONITOR:   PO intake, Supplement acceptance, Labs, Weight trends, Skin, I & O's  REASON FOR ASSESSMENT:   Malnutrition Screening Tool    ASSESSMENT:   [name]. is an 73 y.o. male with past medical history significant for dementia, seizure, disorder, previous strokes, unspecified encephalopathy, hypertension, diabetes complicated by osteomyelitis and stage IV renal failure and mixed CHF who is now brought in by his wife for transient alteration in consciousness.  Patient apparently has had slow decline over the past month with less ability to do his usual ADLs.  Over the past 2 weeks he apparently has stopped feeding himself and has stopped watching TV and he has been increasingly somnolent.  This morning patient was noted to be somewhat stuporous and his blood sugar was noted to be 44 and treated with food and glucose tablets with normalization of blood sugar.  However when patient was lying in bed patient's wife noted that he was staring off into space and then "he went out".  He apparently was unresponsive for a period of time during which EMS was called.  Apparently patient was still unresponsive when EMS brought patient to ED however he has slowly returned to his previous level of consciousness.  Pt admitted with transient alteration in consciousness and seizure disorder.   Reviewed I/O's: +1.3 L x 24 hours and +845 ml since admission  UOP: 450 ml x 24  hours  Spoke with pt, who was very pleasant at time of visit ("I feel much better today than I did yesterday"). Pt reports he has a good appetite and is very appreciative of staff helping to feed him. He estimates he consumed all of his breakfast (eggs, grits, and bacon). PTA, he endorses a good appetite, consuming 3 meals per day (Breakfast: eggs, grits or cream of wheat, and sausage; Lunch: roast beef sandwich and fruit; Dinner: meatloaf, starch, and vegetable). While pt answered questions appropriately, suspect inaccuracy in history. Per palliative care notes, pt wife reports general decline in health over the past several months and is now unable to feed himself. Pt wife also reports that pt eats very little and is very selective in what he eats (often only wants to eat hamburgers).   Per discussion with nurse tech, pt with good appetite (documented meal completion 75%) with feeding assistance from staff.   Reviewed wt hx; noted pt has experienced a 7% wt loss over the past 3 months. While this is not significant for time frame, it is still concerning given multiple comorbidities.   Per chart review, pt wife considering home hospice at discharge.   Labs reviewed: CBGS: 103-216 (inpatient orders for glycemic control are 0-9 units insulin aspart TID with meals).   NUTRITION - FOCUSED PHYSICAL EXAM:    Most Recent Value  Orbital Region  Severe depletion  Upper Arm Region  Severe depletion  Thoracic and Lumbar Region  Moderate depletion  Buccal Region  Moderate depletion  Temple Region  Severe depletion  Clavicle Bone Region  Severe depletion  Clavicle and Acromion Bone Region  Severe depletion  Scapular Bone Region  Severe depletion  Dorsal Hand  Severe depletion  Patellar Region  Severe depletion  Anterior Thigh Region  Severe depletion  Posterior Calf Region  Severe depletion  Edema (RD Assessment)  None  Hair  Reviewed  Eyes  Reviewed  Mouth  Reviewed  Skin  Reviewed  Nails   Reviewed       Diet Order:   Diet Order            Diet renal/carb modified with fluid restriction Diet-HS Snack? Nothing; Fluid restriction: 1200 mL Fluid; Room service appropriate? Yes with Assist; Fluid consistency: Thin  Diet effective now              EDUCATION NEEDS:   Education needs have been addressed  Skin:  Skin Assessment: Reviewed RN Assessment  Last BM:  09/12/18  Height:   Ht Readings from Last 1 Encounters:  09/11/18 5\' 10"  (1.778 m)    Weight:   Wt Readings from Last 1 Encounters:  09/11/18 59 kg    Ideal Body Weight:  70.55 kg(adjusted for lt BKA)  BMI:  Body mass index is 18.66 kg/m.  Estimated Nutritional Needs:   Kcal:  1700-1900  Protein:  85-100 grams  Fluid:  1.2 L    [name] A. Jimmye Norman, RD, LDN, [place] Registered Dietitian II Certified Diabetes Care and Education Specialist Pager: [phone] After hours Pager: [phone]

## 2018-09-13 NOTE — Discharge Summary (Signed)
Physician Discharge Summary  [name]. PPJ:[id number] DOB: May 20, 1945 DOA: 09/11/2018  PCP: Deland Pretty, MD  Admit date: 09/11/2018 Discharge date: 09/13/2018  Time spent: 45 minutes  Recommendations for Outpatient Follow-up:  Patient will be discharged to home with hospice.  Follow up with primary care physician and neurology as needed.  Patient should continue medications as prescribed.  Patient should follow a renal/carb modified diet.   Discharge Diagnoses:  Transient alteration in consciousness, Acute encephalopathy with progressive dementia Seizure disorder Cerbrovascular disease Chronic kidney disease, stage IV Chronic combined systolic and diastolic CHF Essential hypertension Diabetes mellitus, type II Dementia Poor oral intake/ malnutrition Goals of care  Discharge Condition: stable  Diet recommendation: renal/carb modified   Filed Weights   09/11/18 2037  Weight: 59 kg    History of present illness:  On 09/11/2018 by Dr. Cornelia Copa Jimenezis an 73 y.o.malewith past medical history significant for dementia, seizure,disorder, previous strokes, unspecified encephalopathy, hypertension, diabetes complicated by osteomyelitis and stage IV renal failure and mixed CHF who is now brought in by his wife for transient alteration in consciousness. Patient apparently has had slow decline over the past month with less ability to do his usual ADLs. Over the past 2 weeks he apparently has stopped feeding himself and has stopped watching TV and he has been increasingly somnolent. This morning patient was noted to be somewhat stuporous and his blood sugar was noted to be 44 and treated with food and glucose tablets with normalization of blood sugar. However when patient was lying in bed patient's wife noted that he was staring off into space and then "he went out". He apparently was unresponsive for a period of time during which EMS was called. Apparently  patient was still unresponsive when EMS brought patient to ED however he has slowly returned to his previous level of consciousness.  Of note this episode sounds almost exactly like description of seizures per Dr. Tobey Grim note from 07/23/2018:"The patient may lose consciousness for 3 to 5 minutes, he does not stiffen or jerk, he may twitch slightly with arms. He recalls nothing of the event."  Patient's wife notes that she does not believe that he has had a fever or chills or sweats. He has not complained of any particular pain. She is just noted that he has been more somnolent over the past 2 weeks. As noted above she has started to feed him as he is seemingly unwilling/unable to feed himself which he had been doing to weeks previously.  Hospital Course:  Transient alteration in consciousness, Acute encephalopathy with progressive dementia -CT head: No acute intercranial normality.  Old left occipital infarct.  Old ischemic changes in the right basal ganglia region. -Possibly seizure -Patient does have progressive dementia -Case was discussed with neurology on admission, Dr. Lorraine [name], recommended obtaining MRI and EEG -COVID negative -MRI brain: Advanced atrophy, multiple old infarcts and chronic ischemic microangiopathy without acute intracranial abnormality. -EEG: This is an abnormal EEG.  There is generalized moderate slowing of brain activity which is non-specific but may be related dementia, toxic metabolic, or infectious etiologies.  Clinical correlation is recommended.  -UA and chest x-ray unremarkable for infection -Ammonia 31 (WNL) -Of note, patient does have history of seizure disorder, and his lamotrigine was recently increased as an outpatient (per neurology note from 09/10/2018) -Continue to monitor  Seizure disorder -Continue lamotrigine and gabapentin -EEG as above  Cerbrovascular disease -Continue aspirin, Plavix, statin -MRI as above  Chronic kidney disease, stage  IV -Creatinine within stage 4, currently 3.52  Chronic combined systolic and diastolic CHF -Patient appears to be euvolemic -Lasix held- continue on discharge- however check BMP/Creatinine every so often -Continue Coreg  Essential hypertension -Continue Coreg, lasix, Bidil  Diabetes mellitus, type II -Patient did have hypoglycemic episode prior to admission -As an outpatient, he is on glargine 4 units at bedtime as well as sliding scale lispro -Continue insulin sliding scale and CBG monitoring  Dementia -Per outpatient neurology note on 09/10/2018, patient has progressive dementia -Continue memantine, mirtazapine, venlafaxine  Poor oral intake/ malnutrition -BMI 18 -Per HPI, patient has had poor oral intake -On remeron  Goals of care -Palliative care consulted and appreciated -Patient made DNR -He was being followed by palliative care services prior to admission.  -Patient to be discharged to home with hospice  Procedures: EEG  Consultations: Palliative care  Discharge Exam: Vitals:   09/13/18 1251 09/13/18 1322  BP: (!) 193/82 (!) 168/71  Pulse: 61 (!) 59  Resp:  18  Temp:  97.7 F (36.5 C)  SpO2:  100%     General: Well developed, thin, elderly, chronically ill appearing, NAD  HEENT: NCAT, mucous membranes moist.  Neck: Supple  Cardiovascular: S1 S2 auscultated, RRR  Respiratory: Clear to auscultation bilaterally   Abdomen: Soft, nontender, nondistended, + bowel sounds  Extremities: warm dry without cyanosis clubbing or edema  Neuro: AAOx2 (self, place), nonfocal  Psych: Normal affect and demeanor, pleasant  Discharge Instructions Discharge Instructions    Discharge instructions   Complete by: As directed    Patient will be discharged to home with hospice.  Follow up with primary care physician and neurology as needed.  Patient should continue medications as prescribed.  Patient should follow a renal/carb modified diet.     Allergies as  of 09/13/2018      Reactions   Codeine Anaphylaxis   Penicillins Anaphylaxis, Swelling, Rash, Other (See Comments)   PATIENT HAD A PCN REACTION WITH IMMEDIATE RASH, FACIAL/TONGUE/THROAT SWELLING, SOB, OR LIGHTHEADEDNESS WITH HYPOTENSION:  #  #  #  YES  #  #  #  Has patient had a PCN reaction causing severe rash involving mucus membranes or skin necrosis: No Has patient had a PCN reaction that required hospitalization: Unknown Has patient had a PCN reaction occurring within the last 10 years: No If all of the above answers are "NO", then may proceed with Cephalosporin use.      Medication List    STOP taking these medications   BASAGLAR KWIKPEN [place]     TAKE these medications   acetaminophen 500 MG tablet Commonly known as: TYLENOL Take 500-1,000 mg by mouth 2 (two) times daily as needed for mild pain, moderate pain, fever or headache.   ASPERCREME LIDOCAINE EX Apply 1 application topically daily as needed (for pain).   atorvastatin 20 MG tablet Commonly known as: LIPITOR Take 20 mg by mouth every evening.   BiDil 20-37.5 MG tablet Generic drug: isosorbide-hydrALAZINE TAKE 1 TABLET BY MOUTH TWICE DAILY What changed: when to take this   carvedilol 6.25 MG tablet Commonly known as: COREG Take 1 tablet (6.25 mg total) by mouth 2 (two) times daily with a meal.   CENTRUM SILVER ADULT 50+ PO Take 1 tablet by mouth daily.   clopidogrel 75 MG tablet Commonly known as: PLAVIX Take 1 tablet (75 mg total) by mouth daily. May restart in  Two days. What changed: additional instructions   docusate sodium 100 MG capsule Commonly known as:  Colace Take 1 capsule (100 mg total) by mouth 2 (two) times daily.   finasteride 5 MG tablet Commonly known as: PROSCAR Take 5 mg by mouth daily.   FreeStyle Libre 14 Day Sensor Misc 1 patch every 14 (fourteen) days.   furosemide 40 MG tablet Commonly known as: LASIX Take 40 mg by mouth See admin instructions. Take 40 mg by mouth in the  morning and an additional 40 mg as needed for edema or fluid   gabapentin 100 MG capsule Commonly known as: NEURONTIN Take 300 mg by mouth at bedtime.   insulin lispro 100 UNIT/ML injection Commonly known as: HUMALOG Inject 2-4 Units into the skin See admin instructions. Inject 2-4 units into the skin three times a day before meals, per sliding scale   lamoTRIgine 150 MG tablet Commonly known as: LaMICtal Take 1 tablet (150 mg total) by mouth 2 (two) times daily.   memantine 10 MG tablet Commonly known as: NAMENDA TAKE 1 TABLET BY MOUTH TWO  TIMES DAILY   mirtazapine 15 MG tablet Commonly known as: REMERON Take 15 mg by mouth at bedtime.   Nepro Liqd Take 237 mLs by mouth daily as needed (for supplementation).   oxybutynin 5 MG 24 hr tablet Commonly known as: DITROPAN-XL Take 5 mg by mouth every morning.   polyethylene glycol 17 g packet Commonly known as: MIRALAX / GLYCOLAX Take 17 g by mouth daily as needed for mild constipation.   silver sulfADIAZINE 1 % cream Commonly known as: SILVADENE Apply 1 application topically daily as needed (sores).   sodium bicarbonate 650 MG tablet Take 650 mg by mouth 2 (two) times daily.   tamsulosin 0.4 MG Caps capsule Commonly known as: FLOMAX Take 1 capsule (0.4 mg total) by mouth daily after supper. What changed: when to take this   timolol 0.5 % ophthalmic solution Commonly known as: BETIMOL Place 1 drop into both eyes 2 (two) times daily.   Travoprost (BAK Free) 0.004 % Soln ophthalmic solution Commonly known as: [name] Place 1 drop into both eyes at bedtime.   venlafaxine XR 75 MG 24 hr capsule Commonly known as: EFFEXOR-XR Take 75 mg by mouth daily with breakfast.   vitamin C 500 MG tablet Commonly known as: ASCORBIC ACID Take 500 mg by mouth daily.   zinc gluconate 50 MG tablet Take 50 mg by mouth daily.            Durable Medical Equipment  (From admission, onward)         Start     Ordered    09/13/18 1237  For home use only DME Hospital bed  Once    Question Answer Comment  Length of Need Lifetime   The above medical condition requires: Patient requires the ability to reposition immediately   Bed type Semi-electric   Hoyer Lift Yes      09/13/18 1237         Allergies  Allergen Reactions   Codeine Anaphylaxis   Penicillins Anaphylaxis, Swelling, Rash and Other (See Comments)    PATIENT HAD A PCN REACTION WITH IMMEDIATE RASH, FACIAL/TONGUE/THROAT SWELLING, SOB, OR LIGHTHEADEDNESS WITH HYPOTENSION:  #  #  #  YES  #  #  #  Has patient had a PCN reaction causing severe rash involving mucus membranes or skin necrosis: No Has patient had a PCN reaction that required hospitalization: Unknown Has patient had a PCN reaction occurring within the last 10 years: No If all of the above answers are "  NO", then may proceed with Cephalosporin use.    Follow-up Information    Deland Pretty, MD. Go to.   Specialty: Internal Medicine Why: As needed Contact information: [address] [place] [address] [phone]        Josue Hector, MD .   Specialty: Cardiology Contact information: [phone] [address] [place] [address] [phone]            The results of significant diagnostics from this hospitalization (including imaging, microbiology, ancillary and laboratory) are listed below for reference.    Significant Diagnostic Studies: Dg Chest 2 View  Result Date: 09/11/2018 CLINICAL DATA:  Syncope, chest pain, shortness of breath, altered level of consciousness EXAM: CHEST - 2 VIEW COMPARISON:  06/13/2018 FINDINGS: Normal heart size, mediastinal contours, and pulmonary vascularity. Lungs clear. No infiltrate, pleural effusion or pneumothorax. Bones demineralized. IMPRESSION: No acute abnormalities. Electronically Signed   By: Lavonia Dana M.D.   On: 09/11/2018 17:36   Ct Head Wo Contrast  Result Date: 09/11/2018 CLINICAL DATA:   73 year old with syncope. EXAM: CT HEAD WITHOUT CONTRAST TECHNIQUE: Contiguous axial images were obtained from the base of the skull through the vertex without intravenous contrast. COMPARISON:  06/13/2018 FINDINGS: Brain: Old infarct involving the left occipital lobe. Extensive low-density in the subcortical and periventricular white matter. Evidence for old ischemic changes in the right basal ganglia and right internal capsule. Negative for acute hemorrhage, mass lesion, midline shift, hydrocephalus or new large infarct. Vascular: No hyperdense vessel or unexpected calcification. Skull: Normal. Negative for fracture or focal lesion. Sinuses/Orbits: Minimal mucosal thickening in the paranasal sinuses. Other: None IMPRESSION: 1. No acute intracranial abnormality. 2. Old left occipital infarct. Old ischemic changes in the right basal ganglia region. 3. Extensive chronic white matter disease which may represent chronic small vessel ischemic changes. Electronically Signed   By: Markus Daft M.D.   On: 09/11/2018 14:28   Mr Brain Wo Contrast  Result Date: 09/13/2018 CLINICAL DATA:  Altered mental status and possible seizure. EXAM: MRI HEAD WITHOUT CONTRAST TECHNIQUE: Multiplanar, multiecho pulse sequences of the brain and surrounding structures were obtained without intravenous contrast. COMPARISON:  Head CT 09/11/2018 FINDINGS: BRAIN: There is no acute infarct, acute hemorrhage or extra-axial collection. The midline structures are normal. Diffuse confluent hyperintense T2-weighted signal within the periventricular, deep and juxtacortical white matter, most commonly due to chronic ischemic microangiopathy. Large area of encephalomalacia at the site of old left occipital lobe infarct. Old right basal ganglia small vessel infarcts. Advanced atrophy for age. Susceptibility-sensitive sequences show no chronic microhemorrhage or superficial siderosis. No midline shift or other mass effect. VASCULAR: The major intracranial  arterial and venous sinus flow voids are normal. SKULL AND UPPER CERVICAL SPINE: Calvarial bone marrow signal is normal. There is no skull base mass. Visualized upper cervical spine and soft tissues are normal. SINUSES/ORBITS: No fluid levels or advanced mucosal thickening. No mastoid or middle ear effusion. The orbits are normal. IMPRESSION: Advanced atrophy, multiple old infarcts and chronic ischemic microangiopathy without acute intracranial abnormality. Electronically Signed   By: Ulyses Jarred M.D.   On: 09/13/2018 02:04    Microbiology: Recent Results (from the past 240 hour(s))  Novel Coronavirus,NAA,(SEND-OUT TO REF LAB - TAT 24-48 hrs); Hosp Order     Status: None   Collection Time: 09/11/18  3:20 PM   Specimen: Nasopharyngeal Swab; Respiratory  Result Value Ref Range Status   SARS-CoV-2, NAA NOT DETECTED NOT DETECTED Final    Comment: (NOTE)  This test was developed and its performance characteristics determined by [employer]. This test has not been FDA cleared or approved. This test has been authorized by FDA under an Emergency Use Authorization (EUA). This test is only authorized for the duration of time the declaration that circumstances exist justifying the authorization of the emergency use of in vitro diagnostic tests for detection of SARS-CoV-2 virus and/or diagnosis of COVID-19 infection under section 564(b)(1) of the Act, 21 U.S.C. 448JEH-6(D)(1), unless the authorization is terminated or revoked sooner. When diagnostic testing is negative, the possibility of a false negative result should be considered in the context of a patient's recent exposures and the presence of clinical signs and symptoms consistent with COVID-19. An individual without symptoms of COVID-19 and who is not shedding SARS-CoV-2 virus would expect to have a negative (not detected) result in this assay. Performed  At: [facility] [address] [place], [place] [id number] Rush Farmer MD HY:[id number]    [place]  Final    Comment: Performed at [facility], [place] [address], [place], [place] [address]     Labs: Basic Metabolic Panel: Recent Labs  Lab 09/10/18 1529 09/11/18 1305 09/12/18 0248 09/13/18 0213  NA 145* 146* 144 144  K 4.7 4.2 4.4 4.4  CL 108* 111 108 110  CO2 22 26 26 25   GLUCOSE 180* 191* 210* 95  BUN 69* 75* 70* 63*  CREATININE 3.58* 4.30* 4.07* 3.52*  CALCIUM 9.4 9.0 9.1 9.0   Liver Function Tests: Recent Labs  Lab 09/10/18 1529 09/11/18 1305 09/12/18 0248  AST 19 22 22   ALT 19 21 23   ALKPHOS 85 76 73  BILITOT <0.2 0.5 0.3  PROT 6.6 6.2* 6.2*  ALBUMIN 4.1 3.2* 3.3*   No results for input(s): LIPASE, AMYLASE in the last 168 hours. Recent Labs  Lab 09/10/18 1529  AMMONIA 31   CBC: Recent Labs  Lab 09/10/18 1529 09/11/18 1305 09/12/18 0248  WBC 5.2 5.0 5.1  NEUTROABS 3.9 3.4  --   HGB 10.9* 10.2* 10.7*  HCT 34.3* 33.0* 34.5*  MCV 91 97.3 95.6  PLT 200 160 155   Cardiac Enzymes: No results for input(s): CKTOTAL, CKMB, CKMBINDEX, TROPONINI in the last 168 hours. BNP: BNP (last 3 results) No results for input(s): BNP in the last 8760 hours.  ProBNP (last 3 results) No results for input(s): PROBNP in the last 8760 hours.  CBG: Recent Labs  Lab 09/12/18 0758 09/12/18 1206 09/12/18 1653 09/13/18 0803 09/13/18 1158  GLUCAP 145* 84 216* 103* 137*       Signed:  Normal Recinos  [facility] 09/13/2018, 3:00 PM

## 2018-09-13 NOTE — Care Management Important Message (Signed)
Important Message  Patient Details  Name: [name]. MRN: [id number] Date of Birth: 1945-04-30   Medicare Important Message Given:  Yes  PATIENT GAVE VERBAL CONSENT FOR ME TO SIGNED ON HIS BEHALF  [name] [place] 09/13/2018, 2:29 PM

## 2018-09-13 NOTE — Care Management Important Message (Signed)
Important Message  Patient Details  Name: [name]. MRN: [id number] Date of Birth: Oct 30, 1945   Medicare Important Message Given:  Yes     Tamiko Leopard 09/13/2018, 2:28 PM

## 2018-09-13 NOTE — Plan of Care (Signed)
  Problem: Education: Goal: Knowledge of General Education information will improve Description: Including pain rating scale, medication(s)/side effects and non-pharmacologic comfort measures Outcome: Not Progressing   Problem: Health Behavior/Discharge Planning: Goal: Ability to manage health-related needs will improve Outcome: Not Progressing   

## 2018-09-13 NOTE — Discharge Instructions (Signed)
Dementia Caregiver Guide Dementia is a term used to describe a number of symptoms that affect memory and thinking. The most common symptoms include:  Memory loss.  Trouble with language and communication.  Trouble concentrating.  Poor judgment.  Problems with reasoning.  Child-like behavior and language.  Extreme anxiety.  Angry outbursts.  Wandering from home or public places. Dementia usually gets worse slowly over time. In the early stages, people with dementia can stay independent and safe with some help. In later stages, they need help with daily tasks such as dressing, grooming, and using the bathroom. How to help the person with dementia cope Dementia can be frightening and confusing. Here are some tips to help the person with dementia cope with changes caused by the disease. General tips  Keep the person on track with his or her routine.  Try to identify areas where the person may need help.  Be supportive, patient, calm, and encouraging.  Gently remind the person that adjusting to changes takes time.  Help with the tasks that the person has asked for help with.  Keep the person involved in daily tasks and decisions as much as possible.  Encourage conversation, but try not to get frustrated or harried if the person struggles to find words or does not seem to appreciate your help. Communication tips  When the person is talking or seems frustrated, make eye contact and hold the person's hand.  Ask specific questions that need yes or no answers.  Use simple words, short sentences, and a calm voice. Only give one direction at a time.  When offering choices, limit them to just 1 or 2.  Avoid correcting the person in a negative way.  If the person is struggling to find the right words, gently try to help him or her. How to recognize symptoms of stress Symptoms of stress in caregivers include:  Feeling frustrated or angry with the person with  dementia.  Denying that the person has dementia or that his or her symptoms will not improve.  Feeling hopeless and unappreciated.  Difficulty sleeping.  Difficulty concentrating.  Feeling anxious, irritable, or depressed.  Developing stress-related health problems.  Feeling like you have too little time for your own life. Follow these instructions at home:   Make sure that you and the person you are caring for: ? Get regular sleep. ? Exercise regularly. ? Eat regular, nutritious meals. ? Drink enough fluid to keep your urine clear or pale yellow. ? Take over-the-counter and prescription medicines only as told by your health care providers. ? Attend all scheduled health care appointments.  Join a support group with others who are caregivers.  Ask about respite care resources so that you can have a regular break from the stress of caregiving.  Look for signs of stress in yourself and in the person you are caring for. If you notice signs of stress, take steps to manage it.  Consider any safety risks and take steps to avoid them.  Organize medications in a pill box for each day of the week.  Create a plan to handle any legal or financial matters. Get legal or financial advice if needed.  Keep a calendar in a central location to remind the person of appointments or other activities. Tips for reducing the risk of injury  Keep floors clear of clutter. Remove rugs, magazine racks, and floor lamps.  Keep hallways well lit, especially at night.  Put a handrail and nonslip mat in the bathtub or  shower.  Put childproof locks on cabinets that contain dangerous items, such as medicines, alcohol, guns, toxic cleaning items, sharp tools or utensils, matches, and lighters.  Put the locks in places where the person cannot see or reach them easily. This will help ensure that the person does not wander out of the house and get lost.  Be prepared for emergencies. Keep a list of  emergency phone numbers and addresses in a convenient area.  Remove car keys and lock garage doors so that the person does not try to get in the car and drive.  Have the person wear a bracelet that tracks locations and identifies the person as having memory problems. This should be worn at all times for safety. Where to find support: Many individuals and organizations offer support. These include:  Support groups for people with dementia and for caregivers.  Counselors or therapists.  Home health care services.  Adult day care centers. Where to find more information Alzheimer's Association: [employer] Contact a health care provider if:  The person's health is rapidly getting worse.  You are no longer able to care for the person.  Caring for the person is affecting your physical and emotional health.  The person threatens himself or herself, you, or anyone else. Summary  Dementia is a term used to describe a number of symptoms that affect memory and thinking.  Dementia usually gets worse slowly over time.  Take steps to reduce the person's risk of injury, and to plan for future care.  Caregivers need support, relief from caregiving, and time for their own lives. This information is not intended to replace advice given to you by your health care provider. Make sure you discuss any questions you have with your health care provider. Document Released: 02/08/2016 Document Revised: 02/08/2016 Document Reviewed: 02/08/2016 Elsevier Interactive Patient Education  2019 [place] is a service that is designed to provide people who are terminally ill and their families with medical, spiritual, and psychological support. Its aim is to improve your quality of life by keeping you as comfortable as possible in the final stages of life. Who will be my providers when I begin hospice care? Hospice teams often include:  A nurse.  A doctor. The hospice doctor will  be available for your care, but you can include your regular doctor or nurse practitioner.  A Education officer, museum.  A counselor.  A religious leader (such as a Clinical biochemist).  A dietitian.  Therapists.  Trained volunteers who can help with care. What services does hospice provide? Hospice services can vary depending on the center or organization. Generally, they include:  Ways to keep you comfortable, such as: ? Providing care in your home or in a home-like setting. ? Working with your family and friends to help meet your needs. ? Allowing you to enjoy the support of loved ones by receiving much of your basic care from family and friends.  Pain relief and symptom management. The staff will supply all necessary medicines and equipment so that you can stay comfortable and alert enough to enjoy the company of your friends and family.  Visits or care from a nurse and doctor. This may include 24-hour on-call services.  Companionship when you are alone.  Allowing you and your family to rest. Hospice staff may do light housekeeping, prepare meals, and run errands.  Counseling. They will make sure your emotional, spiritual, and social needs are being met, as well as those needs of  your family members.  Spiritual care. This will be individualized to meet your needs and your family's needs. It may involve: ? Helping you and your family understand the dying process. ? Helping you say goodbye to your family and friends. ? Performing a specific religious ceremony or ritual.  Massage.  Nutrition therapy.  Physical and occupational therapy.  Short-term inpatient care, if something cannot be managed in the home.  Art or music therapy.  Bereavement support for grieving family members. When should hospice care begin? Most people who use hospice are believed to have less than 6 months to live.  Your family and health care providers can help you decide when hospice services should begin.  If  you live longer than 6 months but your condition does not improve, your doctor may be able to approve you for continued hospice care.  If your condition improves, you may discontinue the program. What should I consider before selecting a program? Most hospice programs are run by nonprofit, independent organizations. Some are affiliated with hospitals, nursing homes, or home health care agencies. Hospice programs can take place in your home or at a hospice center, hospital, or skilled nursing facility. When choosing a hospice program, ask the following questions:  What services are available to me?  What services will be offered to my loved ones?  How involved will my loved ones be?  How involved will my health care provider be?  Who makes up the hospice care team? How are they trained or screened?  How will my pain and symptoms be managed?  If my circumstances change, can the services be provided in a different setting, such as my home or in the hospital?  Is the program reviewed and licensed by the state or certified in some other way?  What does it cost? Is it covered by insurance?  If I choose a hospice center or nursing home, where is the hospice center located? Is it convenient for family and friends?  If I choose a hospice center or nursing home, can my family and friends visit any time?  Will you provide emotional and spiritual support?  Who can my family call with questions? Where can I learn more about hospice? You can learn about existing hospice programs in your area from your health care providers. You can also read more about hospice online. The websites of the following organizations have helpful information:  [facility] and Palliative Care Organization [facility]): http://www.brown-buchanan.com/  National Association for [facility]): http://massey-hart.com/  Hospice Foundation of [place] ([place]): www.hospicefoundation.org  American Cancer Society (ACS):  www.cancer.org  Hospice Net: www.hospicenet.org  Visiting Nurse Associations of [place] (VNAA): www.vnaa.org You may also find more information by contacting the following agencies:  A local agency on aging.  Your local [employer] chapter.  Your state's department of health or social services. Summary  Hospice is a service that is designed to provide people who are terminally ill and their families with medical, spiritual, and psychological support.  Hospice aims to improve your quality of life by keeping you as comfortable as possible in the final stages of life.  Hospice teams often include a doctor, nurse, social worker, counselor, religious leader,dietitian, therapists, and volunteers.  Hospice care generally includes medicine for symptom management, visits from doctors and nurses, physical and occupational therapy, nutrition counseling, spiritual and emotional counseling, caregiver support, and bereavement support for grieving family members.  Hospice programs can take place in your home or at a hospice center,  hospital, or skilled nursing facility. This information is not intended to replace advice given to you by your health care provider. Make sure you discuss any questions you have with your health care provider. Document Released: 06/23/2003 Document Revised: 02/16/2017 Document Reviewed: 03/28/2016 Elsevier Patient Education  2020 [employer].

## 2018-09-13 NOTE — Progress Notes (Addendum)
NCM received consult for home hospice care. Confirmed d/c plan with pt/wife. Choice provided. Manufacturing engineer selected. NCM made referral with [facility]. Pt will need hospital bed in home prior to pt's d/c. Hospice will arrange DME needs with wife. Whitman Hero RN,BSN,CM

## 2018-09-13 NOTE — Progress Notes (Signed)
14: Spoke with wife [name], updated on condition. Wants call from physician.

## 2018-09-14 LAB — GLUCOSE, CAPILLARY
Glucose-Capillary: 139 mg/dL — ABNORMAL HIGH (ref 70–99)
Glucose-Capillary: 150 mg/dL — ABNORMAL HIGH (ref 70–99)

## 2018-09-14 NOTE — Progress Notes (Signed)
Manufacturing engineer [facility])   Spoke with RN Case Freight forwarder, all equipment delivered and pt is ready to discharge home.  [facility] will contact pt/wife today and schedule a RN visit today or tomorrow, depending on the request of family.  Thank you, Venia Carbon RN, BSN, [facility] Liaison  [phone]

## 2018-09-14 NOTE — Progress Notes (Addendum)
Discharge summary done on 09/13/2018. No change in discharge summary or medications. Patient stable for discharge to home with hospice. Discussed with wife and answered all questions.

## 2018-09-14 NOTE — TOC Transition Note (Signed)
Transition of Care [facility]) - CM/SW Discharge Note   Patient Details  Name: [name]. MRN: [id number] Date of Birth: [birth year]  [facility]) CM/SW Contact:  Carles Collet, RN Phone Number: 09/14/2018, 9:30 AM   Clinical Narrative:    Spoke w patient's wife, she has all equipment at home and is ready to come get him when he is ready for DC. Please call patient's wife to review DC instructions. Please send patient home with signed Gold DNR form.     Final next level of care: Home w Hospice Care Barriers to Discharge: No Barriers Identified   Patient Goals and CMS Choice        Discharge Placement                       Discharge Plan and Services                DME Arranged: Hospital bed DME Agency: [employer]     Representative spoke with at DME Agency: set up through [facility]     Date [place]: 09/14/18 Time [place]: 0930 Representative spoke with at [place]: [place] ([place]) Interventions     Readmission Risk Interventions No flowsheet data found.

## 2018-09-14 NOTE — Progress Notes (Addendum)
1043: Call to patient wife, updated on condition. Wants to ask physician a few questions.  1101: Call to wife. Patient ready for discharge.  1225: Patient discharged home. Discharge instructions provided to wife.

## 2018-09-16 ENCOUNTER — Other Ambulatory Visit: Payer: Self-pay | Admitting: *Deleted

## 2018-09-16 ENCOUNTER — Telehealth: Payer: Self-pay | Admitting: Neurology

## 2018-09-16 NOTE — Patient Outreach (Signed)
[facility]) Care Management  09/16/2018  [name]. 1945/08/29 [id number]   RN Health Coach case closure. Patient was admitted to hospital and placed in hospice program when discharged.  [facility] [phone]

## 2018-09-16 NOTE — Telephone Encounter (Signed)
I called again, left a message again, if the wife or family wishes to talk to me regarding the results following the recent hospitalization, I will be happy to do this, they are to contact our office if this is the case.

## 2018-09-16 NOTE — Telephone Encounter (Signed)
I reached out to the pt's wife ( ok per dpr) and scheduled f/u with Dr. Jannifer Franklin on 09/25/18 at 4 pm. Wife understands to arrive by 330 for check in and to have a face mask in place.

## 2018-09-16 NOTE — Telephone Encounter (Signed)
I called the patient.  I did VV with patient on 6/23, ordered lab work. Creatinine 3.58, glucose 180, sodium 145, HGB 10.9,. Lamictal level was therapeutic 8.0.   I reviewed the chart, it appears he was sent to ER the next day 09/11/2018 for episode of transient alteration of awareness. Seizure?  Episode occurred in the setting of hypoglycemia 44.  He was admitted for transient alteration in consciousness, acute encephalopathy with progressive dementia.  He was discharged home with hospice.  He had MRI of the brain in the hospital showing advanced atrophy, multiple old infarcts and chronic ischemic microangiopathy without acute intracranial abnormality  EEG was abnormal. There is generalized moderate slowing of brain activity which is non-specific but may be related dementia, toxic metabolic, or infectious etiologies. Clinical correlation is recommended.   He has CKD stage 4.  Family does not want dialysis.  According to the hospice note, prognosis is less than 6 months.  I called his wife. We discussed having him come in to see Dr. Jannifer Franklin for a sooner revisit than in September. She would like to discuss his results from the MRI and EEG and what to expect going forward. Please reach out to his wife to schedule a sooner follow-up in the next few weeks. Thank you. She says she is able to transport the patient. He is a recently established hospice patient.

## 2018-09-16 NOTE — Telephone Encounter (Signed)
I called and left a message, I will call back later.

## 2018-09-17 ENCOUNTER — Other Ambulatory Visit: Payer: Self-pay

## 2018-09-17 DIAGNOSIS — F5101 Primary insomnia: Secondary | ICD-10-CM | POA: Diagnosis not present

## 2018-09-17 DIAGNOSIS — R2681 Unsteadiness on feet: Secondary | ICD-10-CM | POA: Diagnosis not present

## 2018-09-17 DIAGNOSIS — K5900 Constipation, unspecified: Secondary | ICD-10-CM | POA: Diagnosis not present

## 2018-09-17 DIAGNOSIS — E1121 Type 2 diabetes mellitus with diabetic nephropathy: Secondary | ICD-10-CM | POA: Diagnosis not present

## 2018-09-18 ENCOUNTER — Ambulatory Visit (HOSPITAL_COMMUNITY)
Admission: RE | Admit: 2018-09-18 | Discharge: 2018-09-18 | Disposition: A | Discharge status: Home / Self Care | Source: Ambulatory Visit | Attending: Nephrology | Admitting: Nephrology

## 2018-09-18 ENCOUNTER — Other Ambulatory Visit: Payer: Self-pay

## 2018-09-18 VITALS — BP 137/75 | HR 77 | Temp 98.0°F

## 2018-09-18 DIAGNOSIS — N184 Chronic kidney disease, stage 4 (severe): Secondary | ICD-10-CM | POA: Insufficient documentation

## 2018-09-18 LAB — IRON AND TIBC
Iron: 84 ug/dL (ref 45–182)
Saturation Ratios: 35 % (ref 17.9–39.5)
TIBC: 241 ug/dL — ABNORMAL LOW (ref 250–450)
UIBC: 157 ug/dL

## 2018-09-18 LAB — FERRITIN: Ferritin: 71 ng/mL (ref 24–336)

## 2018-09-18 LAB — POCT HEMOGLOBIN-HEMACUE: Hemoglobin: 11.6 g/dL — ABNORMAL LOW (ref 13.0–17.0)

## 2018-09-18 MED ORDER — DARBEPOETIN ALFA 100 MCG/0.5ML IJ SOSY
PREFILLED_SYRINGE | INTRAMUSCULAR | Status: AC
  Administered 2018-09-18: 100 ug via SUBCUTANEOUS
  Filled 2018-09-18: qty 0.5

## 2018-09-18 MED ORDER — DARBEPOETIN ALFA 100 MCG/0.5ML IJ SOSY
100.0000 ug | PREFILLED_SYRINGE | INTRAMUSCULAR | Status: DC
  Administered 2018-09-18: 100 ug via SUBCUTANEOUS

## 2018-09-24 ENCOUNTER — Ambulatory Visit: Payer: Medicare Other | Admitting: Neurology

## 2018-09-25 ENCOUNTER — Ambulatory Visit: Payer: Medicare Other | Admitting: Neurology

## 2018-09-25 ENCOUNTER — Encounter: Payer: Self-pay | Admitting: Neurology

## 2018-09-25 ENCOUNTER — Other Ambulatory Visit: Payer: Self-pay

## 2018-09-25 VITALS — BP 156/69 | HR 71 | Temp 97.5°F | Wt 138.0 lb

## 2018-09-25 DIAGNOSIS — R269 Unspecified abnormalities of gait and mobility: Secondary | ICD-10-CM | POA: Diagnosis not present

## 2018-09-25 DIAGNOSIS — I679 Cerebrovascular disease, unspecified: Secondary | ICD-10-CM

## 2018-09-25 DIAGNOSIS — G40909 Epilepsy, unspecified, not intractable, without status epilepticus: Secondary | ICD-10-CM | POA: Diagnosis not present
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
## 2018-09-30 DIAGNOSIS — I129 Hypertensive chronic kidney disease with stage 1 through stage 4 chronic kidney disease, or unspecified chronic kidney disease: Secondary | ICD-10-CM | POA: Diagnosis not present

## 2018-09-30 DIAGNOSIS — N2581 Secondary hyperparathyroidism of renal origin: Secondary | ICD-10-CM | POA: Diagnosis not present

## 2018-09-30 DIAGNOSIS — N184 Chronic kidney disease, stage 4 (severe): Secondary | ICD-10-CM | POA: Diagnosis not present

## 2018-09-30 DIAGNOSIS — E1122 Type 2 diabetes mellitus with diabetic chronic kidney disease: Secondary | ICD-10-CM | POA: Diagnosis not present

## 2018-09-30 DIAGNOSIS — D631 Anemia in chronic kidney disease: Secondary | ICD-10-CM | POA: Diagnosis not present

## 2018-10-05 ENCOUNTER — Other Ambulatory Visit: Payer: Self-pay | Admitting: Cardiovascular Disease

## 2018-10-05 DIAGNOSIS — I2510 Atherosclerotic heart disease of native coronary artery without angina pectoris: Secondary | ICD-10-CM

## 2018-10-05 DIAGNOSIS — I2583 Coronary atherosclerosis due to lipid rich plaque: Secondary | ICD-10-CM

## 2018-10-09 ENCOUNTER — Ambulatory Visit: Payer: Medicare Other

## 2018-10-14 ENCOUNTER — Other Ambulatory Visit: Payer: Self-pay

## 2018-10-14 MED ORDER — MEMANTINE HCL 10 MG PO TABS: 10.0000 mg | ORAL_TABLET | Freq: Two times a day (BID) | ORAL | 3 refills | Status: DC

## 2018-10-15 ENCOUNTER — Telehealth: Payer: Self-pay | Admitting: Neurology

## 2018-10-15 MED ORDER — MEMANTINE HCL 10 MG PO TABS: 10.0000 mg | ORAL_TABLET | Freq: Two times a day (BID) | ORAL | 0 refills | Status: AC

## 2018-10-15 NOTE — Telephone Encounter (Signed)
Pt wife is asking if about 8 days worth of memantine (NAMENDA) 10 MG tablet being called into [facility] while waiting on mail order. Wife also asking RN calls her YO:MAYOKHTXHF (SEROQUEL) 25 MG tablet and mirtazapine (REMERON) 15 MG tablet, she wants to know if pt still needs to take memantine (NAMENDA) 10 MG tablet

## 2018-10-15 NOTE — Telephone Encounter (Signed)
I reached out to the pt's wife. She wanted to confirm if the pt should be receiving the namenda since he is under hospice care now. I advised the pt's wife that would be her decision and we could be ok with whichever she decided. She did state hospice MD changed the pt's Seroquel to 50 mg and the pt has been sleeping most of the day. The pt's wife has cut the 50 mg tablet in half for his dosage today and will discuss with the hospice nurse tomorrow on possibly cutting the dosage back further. I have sent a 1 week supply for the pt's namenda incase she decides to continue the medication.

## 2018-10-15 NOTE — Addendum Note (Signed)
Addended by: Verlin Grills T on: 10/15/2018 03:19 PM   Modules accepted: Orders

## 2018-10-16 ENCOUNTER — Encounter (HOSPITAL_COMMUNITY): Payer: Medicare Other

## 2018-11-04 ENCOUNTER — Telehealth: Payer: Self-pay | Admitting: Orthopedic Surgery

## 2018-11-04 NOTE — Telephone Encounter (Signed)
Can we see him this week

## 2018-11-04 NOTE — Telephone Encounter (Signed)
Patient's wife answered and stated she can bring patient in on 11/07/2018 for appt.

## 2018-11-04 NOTE — Telephone Encounter (Signed)
Can you please call and make an appt for pt. There is no way to tell what correct treatment will be without evaluating new area.

## 2018-11-04 NOTE — Telephone Encounter (Signed)
Patient's wife called stating that the patient has rubbed a place on his heel.  She has been putting medication on it since Friday.  She wants to know if she is doing the right thing or does she need to make an appointment with Dr. [name].  CB#[phone].  Thank you.

## 2018-11-06 ENCOUNTER — Other Ambulatory Visit: Payer: Self-pay | Admitting: Cardiovascular Disease

## 2018-11-06 DIAGNOSIS — I2510 Atherosclerotic heart disease of native coronary artery without angina pectoris: Secondary | ICD-10-CM

## 2018-11-07 ENCOUNTER — Ambulatory Visit (INDEPENDENT_AMBULATORY_CARE_PROVIDER_SITE_OTHER): Admitting: Orthopedic Surgery

## 2018-11-07 ENCOUNTER — Encounter: Payer: Self-pay | Admitting: Orthopedic Surgery

## 2018-11-07 VITALS — Ht 70.0 in | Wt 138.0 lb

## 2018-11-07 DIAGNOSIS — L97511 Non-pressure chronic ulcer of other part of right foot limited to breakdown of skin: Secondary | ICD-10-CM

## 2018-11-07 DIAGNOSIS — Z89512 Acquired absence of left leg below knee: Secondary | ICD-10-CM | POA: Diagnosis not present
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
## 2018-11-08 ENCOUNTER — Other Ambulatory Visit: Payer: Self-pay | Admitting: Pharmacy Technician

## 2018-11-08 NOTE — Patient Outreach (Signed)
[facility]) Care Management  11/08/2018  [name] [birth year] [id number]   Incoming call from patient's wife stating that [facility] says patient's Bidil is $47 this month and she was inquiring about the program he had been approved for in February where he was received the med for free.  Outgoing call to [place], spoke to [facility] who stated that there were claims using grant money in Feb, Mar and April but patient has been paying co-pay for medication monthly since then. [place] Ree states there isn't a way to tell if the past claims had been attempted to be processed thru program billing information.  Outgoing call to [place] states that patient had been dis-enrolled from program as of 8/17 due to no claims being submitted from pharmacy to program since April 19th. Program requires a claim to be submitted within 120 days   Transferred to Claims Department to initiate "Fatima Sanger Reversal", Frankey Poot submitted request and states that it could take 7-10 business days for a determination. He states that someone will contact me with decision.  Outgoing call to patient's wife, HIPAA identifiers verified, informed her of all details above. [name] states that her daughter picks the meds up for them so wasn't aware that she had been paying for the Bidil but appreciated me looking into the situation for her.  Maud Deed Chana [place] Certified Pharmacy Technician [place] Management Direct [phone]

## 2018-11-20 ENCOUNTER — Other Ambulatory Visit: Payer: Self-pay | Admitting: Pharmacy Technician

## 2018-11-20 NOTE — Patient Outreach (Addendum)
[facility]) Care Management  11/20/2018  [name]. January 08, 1946 [id number]   Incoming call from [facility] stating that patients request for re-instatement for program has been approved. She states that account has $859 left on it that can be used at the pharmacy for his Bidil script. The funds expire on 05/02/2019 OR if a claim for the medication has not been submitted to foundation within 120 day timeframe.   Unsuccessful call placed to patients wife, [name] to inform her of above details. Left HIPAA compliant voicemail requesting a return call.  ADDENDUM 12:50pm  Incoming call from patients wife, [name], [place] identifiers verified. Informed her of above details and requested that she contact me if there are any issues going forward with obtaining refills.  Maud Deed Chana Bode [place] Certified Pharmacy Technician [place] Management Direct Dial:[phone]

## 2018-11-26 ENCOUNTER — Telehealth: Payer: Self-pay | Admitting: Cardiovascular Disease

## 2018-11-26 NOTE — Telephone Encounter (Signed)
New Message    Pt c/o BP issue: STAT if pt c/o blurred vision, one-sided weakness or slurred speech  1. What are your last 5 BP readings? 9/6 - 92/49 The same day it went up to 118/64 then 137/62 9/7 - morning 176/82 later that night it was 131/65  2. Are you having any other symptoms (ex. Dizziness, headache, blurred vision, passed out)? Dizziness  3. What is your BP issue? Patient's wife states he seen his PCP and they wanted him to check with cardiologist to make sure medication isn't causing his issues.

## 2018-11-26 NOTE — Telephone Encounter (Signed)
Spoke with the pts wife and she reports the pt is on Hospice but mobile enough for appts... has been having some dizziness when rising. She also said he strains a lot when having a bowel movement and he gets very dizzy.[name] they are treating him with Senna daily to help.. she says his BP is up and down:   92/49, 118/64, 137/62, 176/82, 131/65. She does not know his HR.[name]   Pt denies chest pain, sob, edema.[name]   His HGB and TIBC was low 09/2018 and I have asked her to talk with Dr. Shelia Media to see if it has been rechecked. She says that he was getting a "shot" monthly prescribed by his Nephrologist but can no longer receive since on Hospice.   She is asking if any of his cardiac meds can be changed.. his last OV was 12/2017... will forward to Dr. Johnsie Cancel for his recommendations re: med changes or need OV... in office or virtual.

## 2018-11-26 NOTE — Telephone Encounter (Addendum)
Pts wife advised per Dr. Johnsie Cancel that the pt can d/c isosorbide... and will continue talking with his PCP, Nephrologist and Hospice. She will also monitor his BP.  Will check with Dr. Johnsie Cancel to clarify... does he want him off of the hydralazine part of the bidil also or d/c along with the Isosorbide.      isosorbide-hydrALAZINE (BIDIL) 20-37.5 MG tablet [[id number]]

## 2018-11-26 NOTE — Telephone Encounter (Signed)
I don't think he really needs cardiac f/u if he is on hospice Only really on imdur as heart med and this can be stopped He should f/u with renal , primary and hospice

## 2018-11-26 NOTE — Telephone Encounter (Signed)
LMTCB

## 2018-12-16 DIAGNOSIS — E1122 Type 2 diabetes mellitus with diabetic chronic kidney disease: Secondary | ICD-10-CM | POA: Diagnosis not present

## 2018-12-16 DIAGNOSIS — Z794 Long term (current) use of insulin: Secondary | ICD-10-CM | POA: Diagnosis not present

## 2018-12-18 ENCOUNTER — Ambulatory Visit: Payer: Self-pay | Admitting: Neurology

## 2019-01-23 ENCOUNTER — Telehealth: Payer: Self-pay | Admitting: Orthopedic Surgery

## 2019-01-23 ENCOUNTER — Encounter: Payer: Self-pay | Admitting: Orthopedic Surgery

## 2019-01-23 ENCOUNTER — Ambulatory Visit (INDEPENDENT_AMBULATORY_CARE_PROVIDER_SITE_OTHER): Admitting: Orthopedic Surgery

## 2019-01-23 ENCOUNTER — Other Ambulatory Visit: Payer: Self-pay

## 2019-01-23 VITALS — Ht 70.0 in | Wt 138.0 lb

## 2019-01-23 DIAGNOSIS — L97511 Non-pressure chronic ulcer of other part of right foot limited to breakdown of skin: Secondary | ICD-10-CM

## 2019-01-23 DIAGNOSIS — Z89512 Acquired absence of left leg below knee: Secondary | ICD-10-CM

## 2019-01-23 NOTE — Telephone Encounter (Signed)
Can you please call pt and make an appt for eval either today or tomorrow please?

## 2019-01-23 NOTE — Telephone Encounter (Signed)
Scheduled patient today at 1:15pm with Dr [name]

## 2019-01-23 NOTE — Telephone Encounter (Signed)
Patient's wife called stating that he has a new sore on the same foot but in a different spot.  She is wanting to speak with you in reference to this sore.  CB#[id number].  Thank you.

## 2019-01-24 ENCOUNTER — Encounter: Payer: Self-pay | Admitting: Orthopedic Surgery
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
## 2019-01-30 ENCOUNTER — Ambulatory Visit: Payer: Medicare Other | Admitting: Neurology

## 2019-02-11 DIAGNOSIS — Z794 Long term (current) use of insulin: Secondary | ICD-10-CM | POA: Diagnosis not present

## 2019-02-11 DIAGNOSIS — E1122 Type 2 diabetes mellitus with diabetic chronic kidney disease: Secondary | ICD-10-CM | POA: Diagnosis not present
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
## 2019-02-12 ENCOUNTER — Encounter: Payer: Self-pay | Admitting: Neurology

## 2019-02-12 ENCOUNTER — Ambulatory Visit: Admitting: Neurology

## 2019-02-12 ENCOUNTER — Other Ambulatory Visit: Payer: Self-pay

## 2019-02-12 VITALS — BP 146/74 | HR 60 | Temp 97.3°F | Ht 70.0 in | Wt 140.0 lb

## 2019-02-12 DIAGNOSIS — F0150 Vascular dementia without behavioral disturbance: Secondary | ICD-10-CM | POA: Diagnosis not present

## 2019-02-12 DIAGNOSIS — G40909 Epilepsy, unspecified, not intractable, without status epilepticus: Secondary | ICD-10-CM | POA: Diagnosis not present

## 2019-02-12 NOTE — Progress Notes (Signed)
I have read the note, and I agree with the clinical assessment and plan.  Hyatt Capobianco K Dari Carpenito   

## 2019-02-12 NOTE — Patient Instructions (Signed)
Things look better today :)  Continue the Lamictal and Namenda.  Please call for recurrent seizure.  Return in 6 months

## 2019-02-20 ENCOUNTER — Other Ambulatory Visit: Payer: Self-pay

## 2019-02-20 ENCOUNTER — Ambulatory Visit (INDEPENDENT_AMBULATORY_CARE_PROVIDER_SITE_OTHER): Admitting: Orthopedic Surgery

## 2019-02-20 ENCOUNTER — Encounter: Payer: Self-pay | Admitting: Orthopedic Surgery

## 2019-02-20 VITALS — Ht 70.0 in | Wt 140.0 lb

## 2019-02-20 DIAGNOSIS — L97511 Non-pressure chronic ulcer of other part of right foot limited to breakdown of skin: Secondary | ICD-10-CM | POA: Diagnosis not present
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
## 2019-03-07 ENCOUNTER — Ambulatory Visit: Admitting: Physician Assistant

## 2019-03-07 ENCOUNTER — Encounter: Payer: Self-pay | Admitting: Physician Assistant

## 2019-03-07 ENCOUNTER — Other Ambulatory Visit: Payer: Self-pay

## 2019-03-07 VITALS — Ht 70.0 in | Wt 140.0 lb

## 2019-03-07 DIAGNOSIS — L97511 Non-pressure chronic ulcer of other part of right foot limited to breakdown of skin: Secondary | ICD-10-CM

## 2019-03-07 MED ORDER — DOXYCYCLINE HYCLATE 100 MG PO TABS: 100.0000 mg | ORAL_TABLET | Freq: Two times a day (BID) | ORAL | 0 refills | Status: AC
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
## 2019-03-20 ENCOUNTER — Encounter: Payer: Self-pay | Admitting: Physician Assistant

## 2019-03-20 ENCOUNTER — Ambulatory Visit: Admitting: Physician Assistant

## 2019-03-20 ENCOUNTER — Other Ambulatory Visit: Payer: Self-pay

## 2019-03-20 VITALS — Ht 70.0 in | Wt 140.0 lb

## 2019-03-20 DIAGNOSIS — I87321 Chronic venous hypertension (idiopathic) with inflammation of right lower extremity: Secondary | ICD-10-CM
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
## 2019-03-31 ENCOUNTER — Telehealth: Payer: Self-pay | Admitting: Orthopedic Surgery

## 2019-03-31 NOTE — Telephone Encounter (Signed)
Received call from patient's wife advised she will send picture of patient's foot to Dr [name] for him to look at. [name] said patient is not doing well and wanted to ask Dr [name] some questions concerning patient's foot via mychart. The number to contact [name] is [phone]

## 2019-04-01 ENCOUNTER — Encounter: Payer: Self-pay | Admitting: Orthopedic Surgery

## 2019-04-01 NOTE — Telephone Encounter (Signed)
I called and lm on vm to advise that I did not receive anything in mychart. They can send pictures of the pt's foot and ask any questions needed through mychart or call the office whichever is best for them.

## 2019-04-03 ENCOUNTER — Ambulatory Visit: Admitting: Orthopedic Surgery

## 2019-04-07 ENCOUNTER — Telehealth: Payer: Self-pay | Admitting: Neurology

## 2019-04-07 NOTE — Telephone Encounter (Signed)
Patient passed away on Saturday.

## 2019-04-10 ENCOUNTER — Telehealth: Payer: Self-pay | Admitting: Cardiovascular Disease

## 2019-04-10 ENCOUNTER — Telehealth: Payer: Self-pay | Admitting: Orthopedic Surgery

## 2019-04-10 NOTE — Telephone Encounter (Signed)
Will forward to Dr. Johnsie Cancel and medical records.

## 2019-04-10 NOTE — Telephone Encounter (Signed)
New Message:      Pt 's wife called and wanted Dr Johnsie Cancel to know that pt passed away on 05-05-19,

## 2019-04-10 NOTE — Telephone Encounter (Signed)
Pt wife [name] called in to notify Dr.Duda that the patient has passed away 2019-04-28.

## 2019-04-10 NOTE — Telephone Encounter (Signed)
Dr. [name] aware and sympathy card mailed.

## 2019-04-21 DEATH — deceased

## 2019-07-15 IMAGING — MR MR FOOT*R* W/O CM
4 of 5 series · 29 of 40 positions shown · non-contrast
Comparison: Right foot x-rays dated March 14, 2013.

CLINICAL DATA: Diabetic foot ulcer in the medial plantar foot near
the arch. Evaluate for osteomyelitis.

EXAM:
MRI OF THE RIGHT FOREFOOT WITHOUT CONTRAST
TECHNIQUE: Multiplanar, multisequence MR imaging of the right forefoot was
performed. No intravenous contrast was administered.

[Series 3: ti short axis · coronal · right · 4.0mm · 0.31mm/px · 8 of 36 slices shown]
[im 1/36]
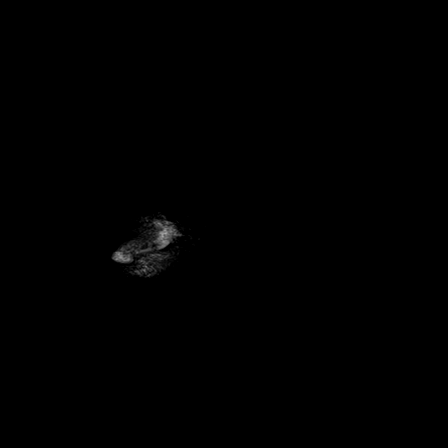
[im 4/36]
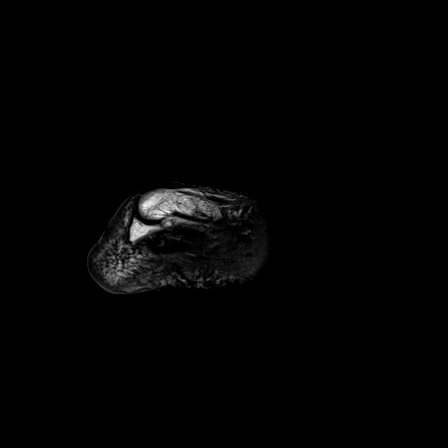
[im 12/36]
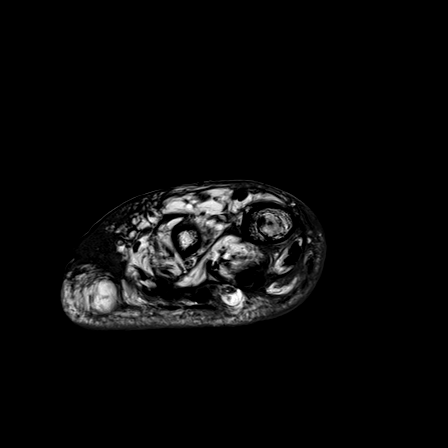
[im 16/36]
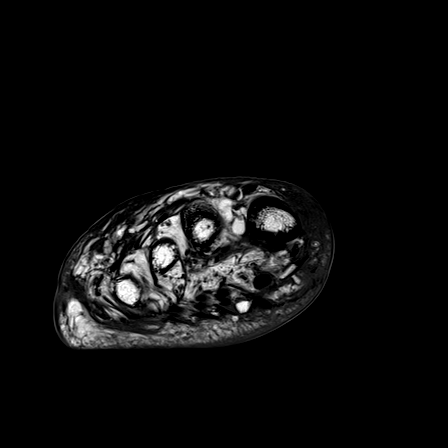
[im 20/36]
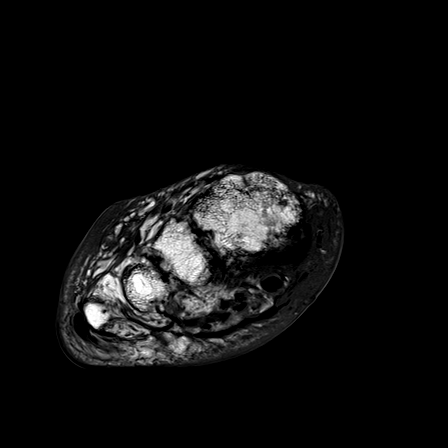
[im 24/36]
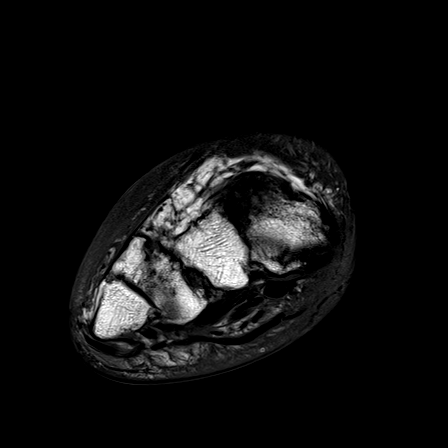
[im 32/36]
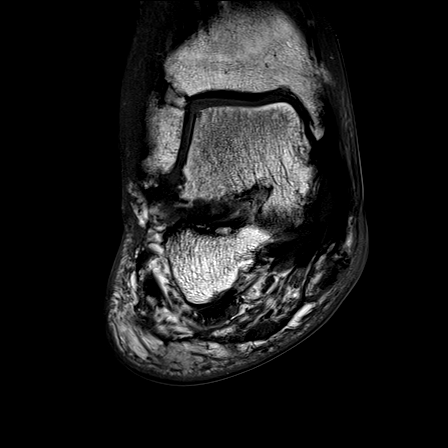
[im 36/36]
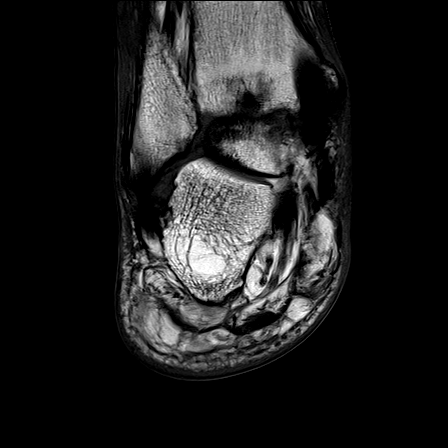

[Series 4: T2 fat-sat · coronal · right · 4.0mm · 0.39mm/px · 11 of 36 slices shown]
[im 1/36]
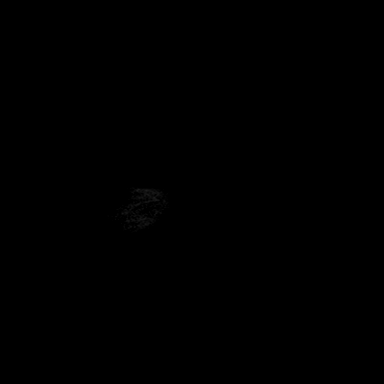
[im 4/36]
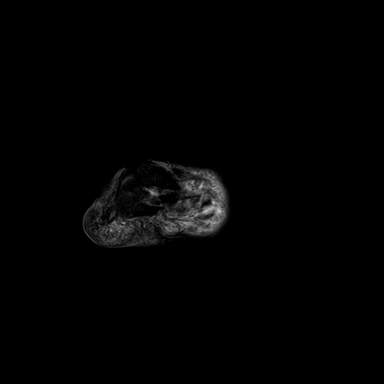
[im 8/36]
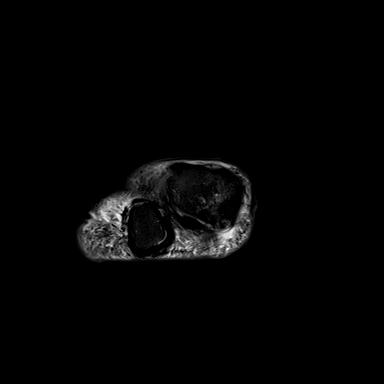
[im 11/36]
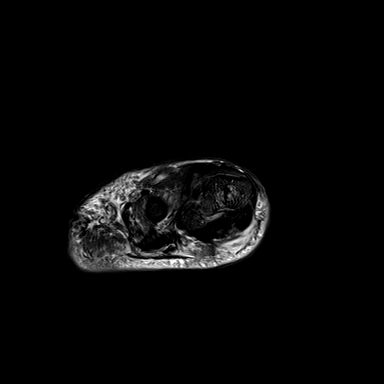
[im 15/36]
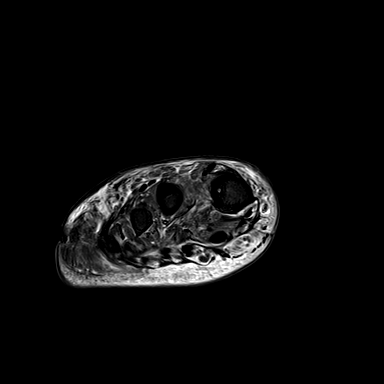
[im 18/36]
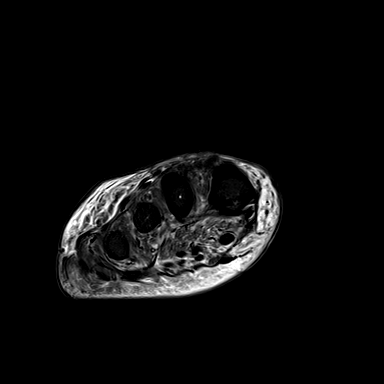
[im 22/36]
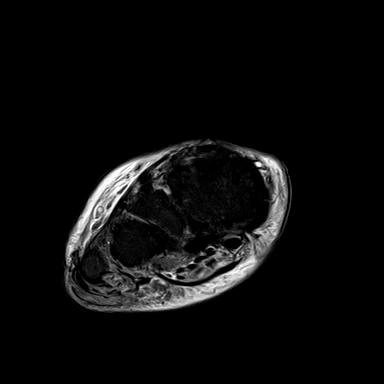
[im 25/36]
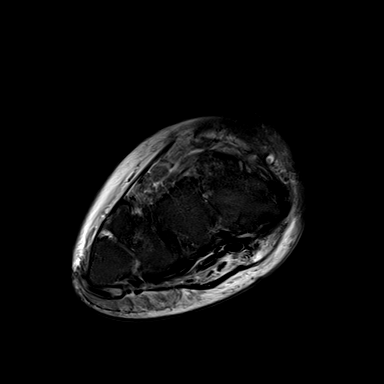
[im 29/36]
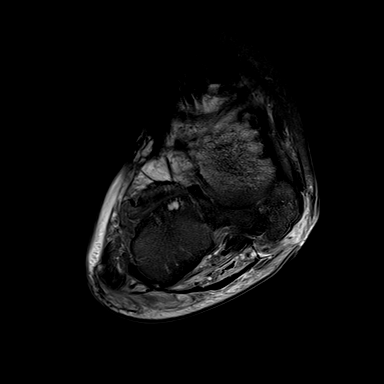
[im 32/36]
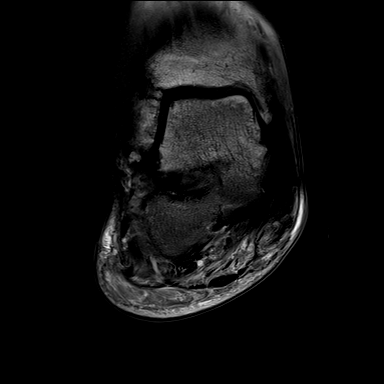
[im 36/36]
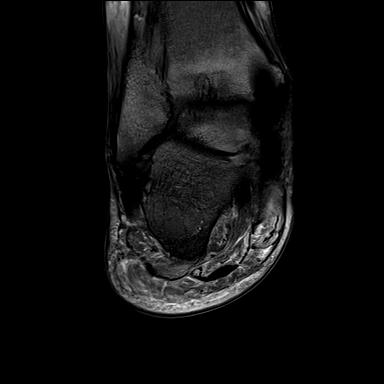

[Series 5: T1 · axial · right · 3.0mm · 0.45mm/px · z∈[-102,-40]mm · 6 of 20 slices shown]
[im 1/20]
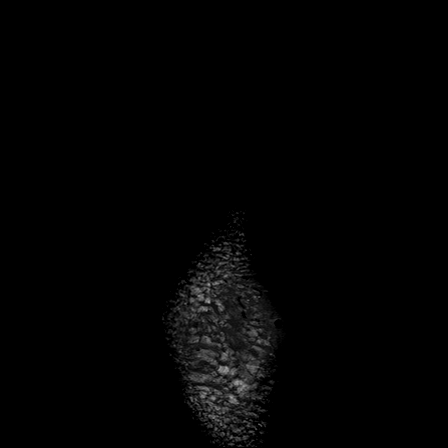
[im 4/20]
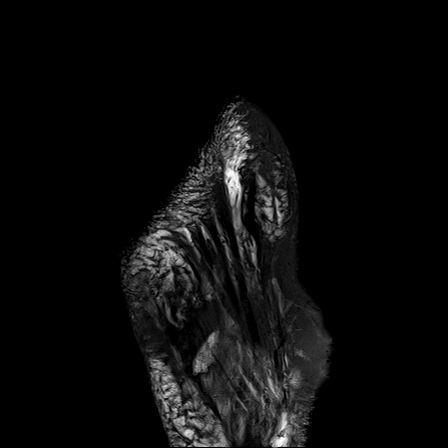
[im 8/20]
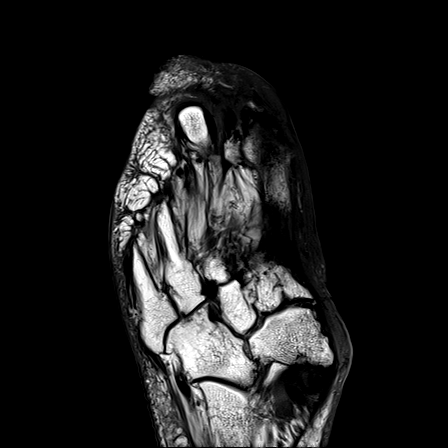
[im 12/20]
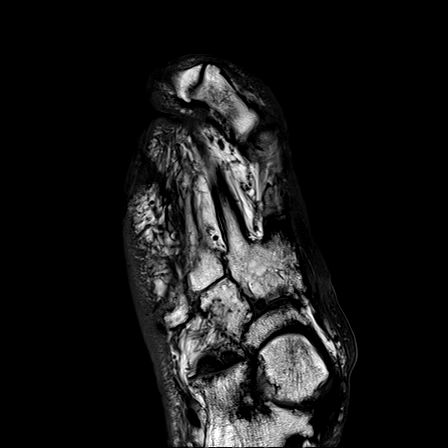
[im 16/20]
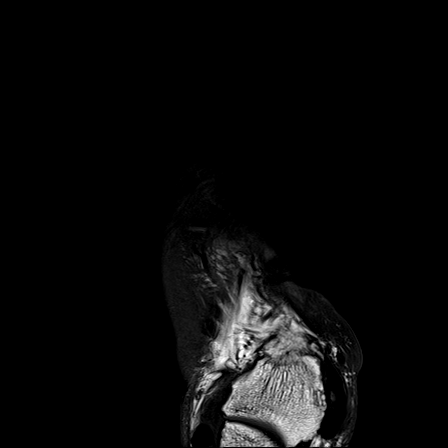
[im 20/20]
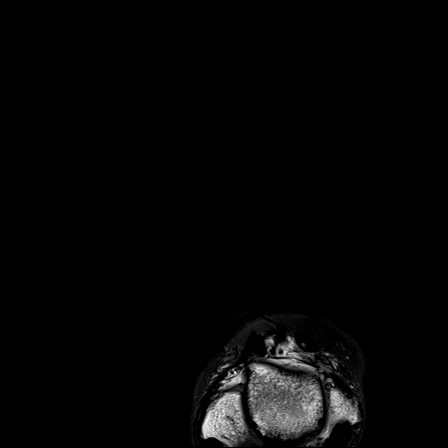

[Series 6: STIR · axial · right · 3.0mm · 0.45mm/px · z∈[-102,-40]mm · 4 of 20 slices shown]
[im 1/20]
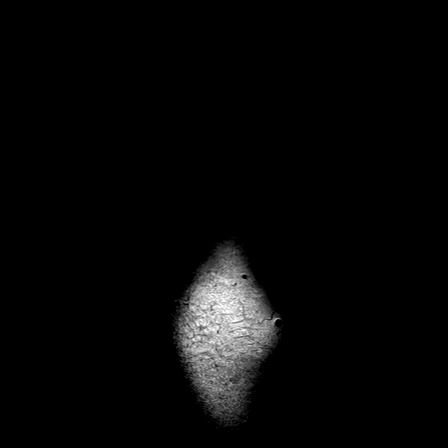
[im 4/20]
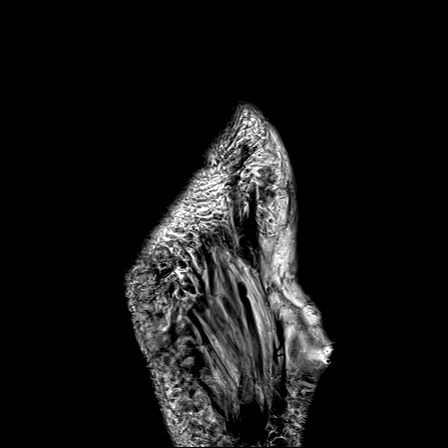
[im 12/20]
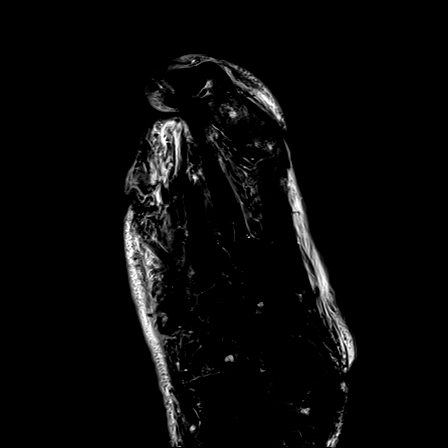
[im 20/20]
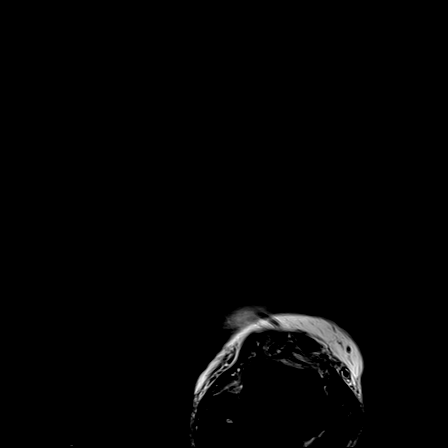

[29 of 40 positions shown; findings below may reference images not displayed]

FINDINGS: Bones/Joint/Cartilage

Prior third through fifth transmetatarsal amputations. Prior
amputation of the second toe. No suspicious marrow signal
abnormality to suggest osteomyelitis. Degenerative marrow edema and
cystic change in the first metatarsal head, first proximal phalanx,
and calcaneocuboid and naviculocuneiform joints. Prior ankylosis of
the first and second tarsometatarsal joints. Moderate hallux valgus
deformity. No fracture or dislocation. No joint effusion. Cartilage
loss and subchondral marrow edema in the posterior tibiotalar joint.
Small amount of marrow edema in the lateral talar process. Pes
planus.

Ligaments

First digit collateral ligaments are intact.

Muscles and Tendons
No tenosynovitis. Diffuse fatty atrophy of the intrinsic muscles of
the forefoot.

Soft tissue
Soft tissue ulceration along the medial plantar foot near the
navicular. Mild diffuse soft tissue edema about the forefoot. No
fluid collection. No soft tissue mass.
IMPRESSION: 1. Soft tissue ulceration along the medial plantar foot near the
navicular bone, without evidence of osteomyelitis. No drainable
fluid collection.
2. Multiple prior amputations. Neuropathic arthropathy of the
midfoot.

## 2019-07-23 IMAGING — DX DG CHEST 2V
2 series · 2 of 2 positions shown · non-contrast
Comparison: 07/02/2015

CLINICAL DATA: New onset weakness.

EXAM:
CHEST  2 VIEW

[chest lat]
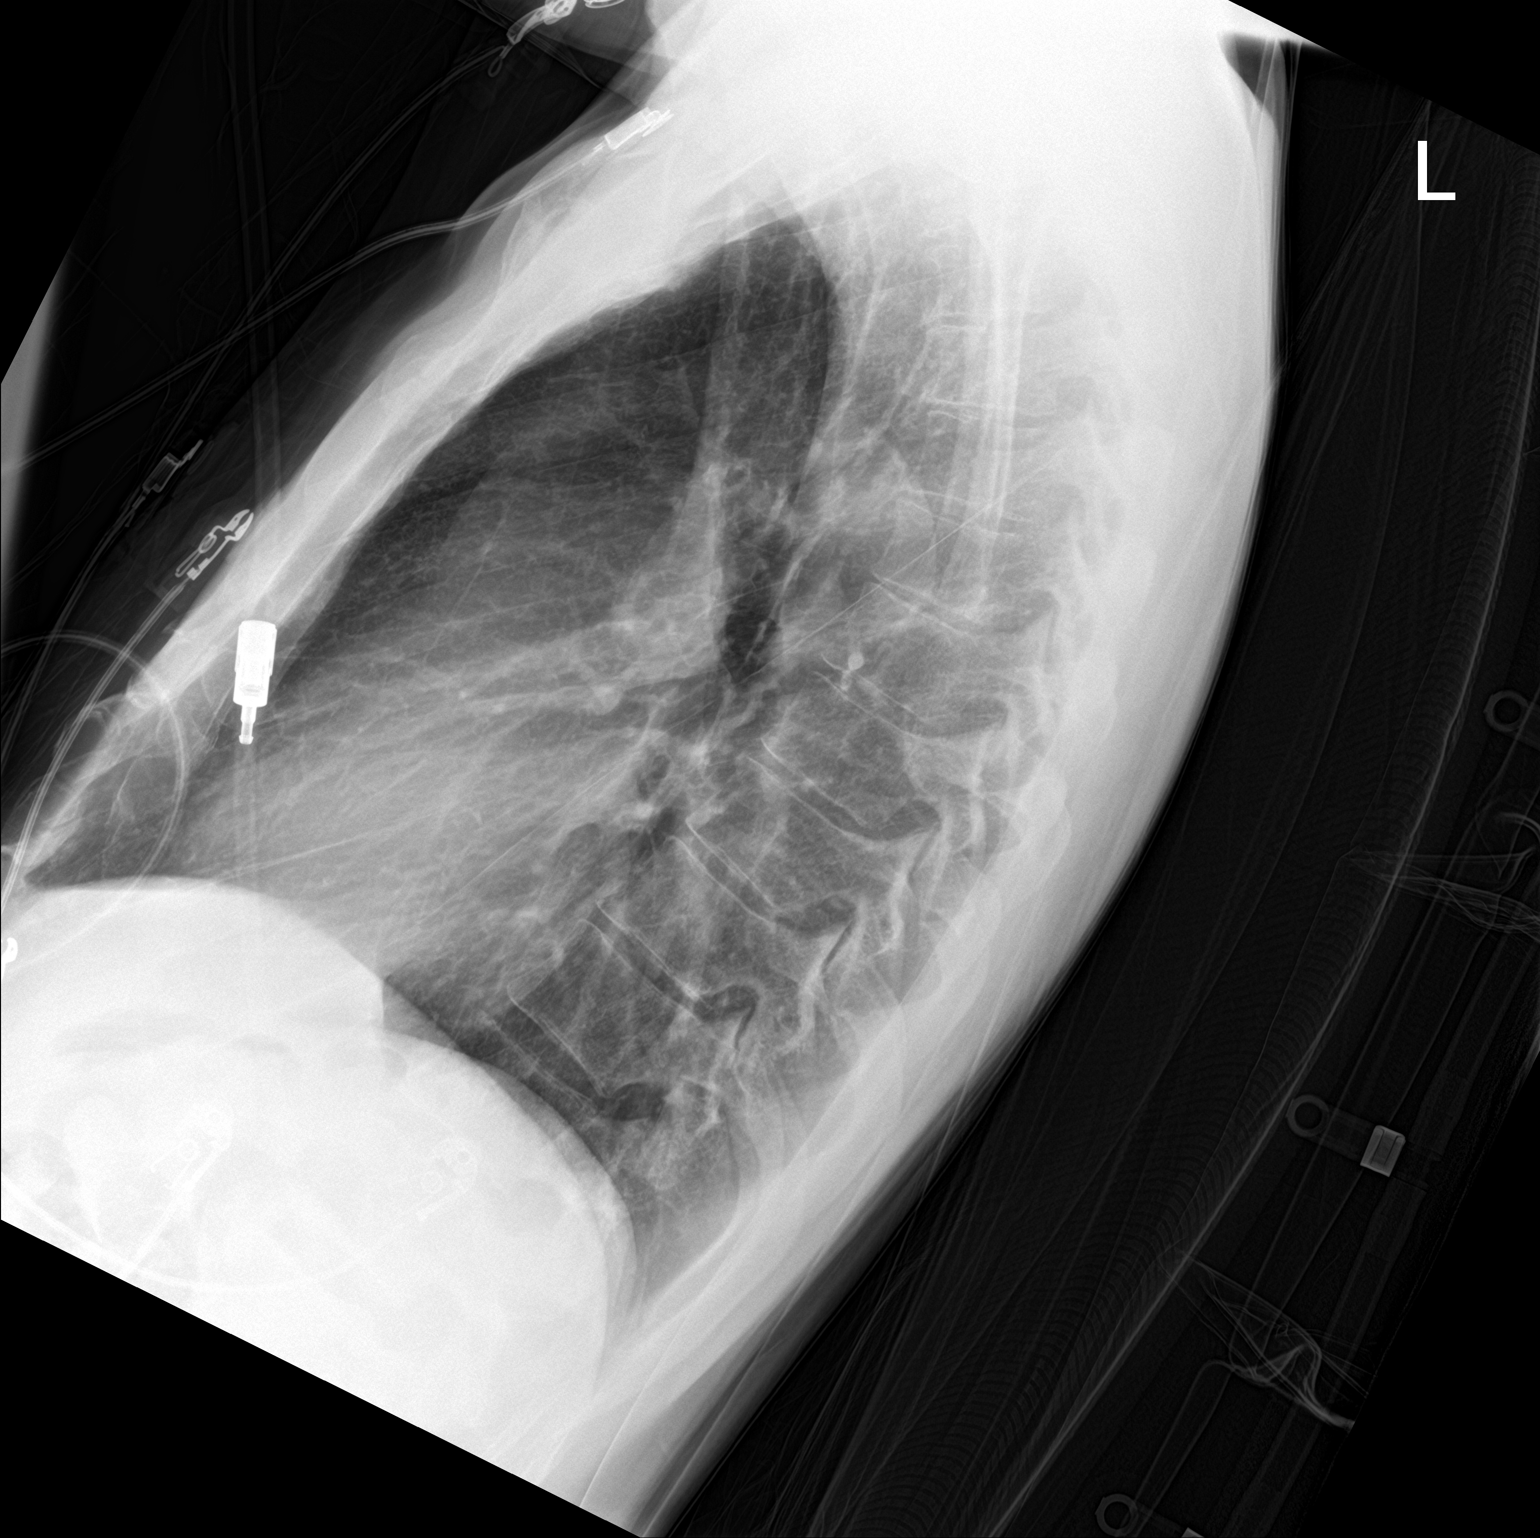

[chest ap]
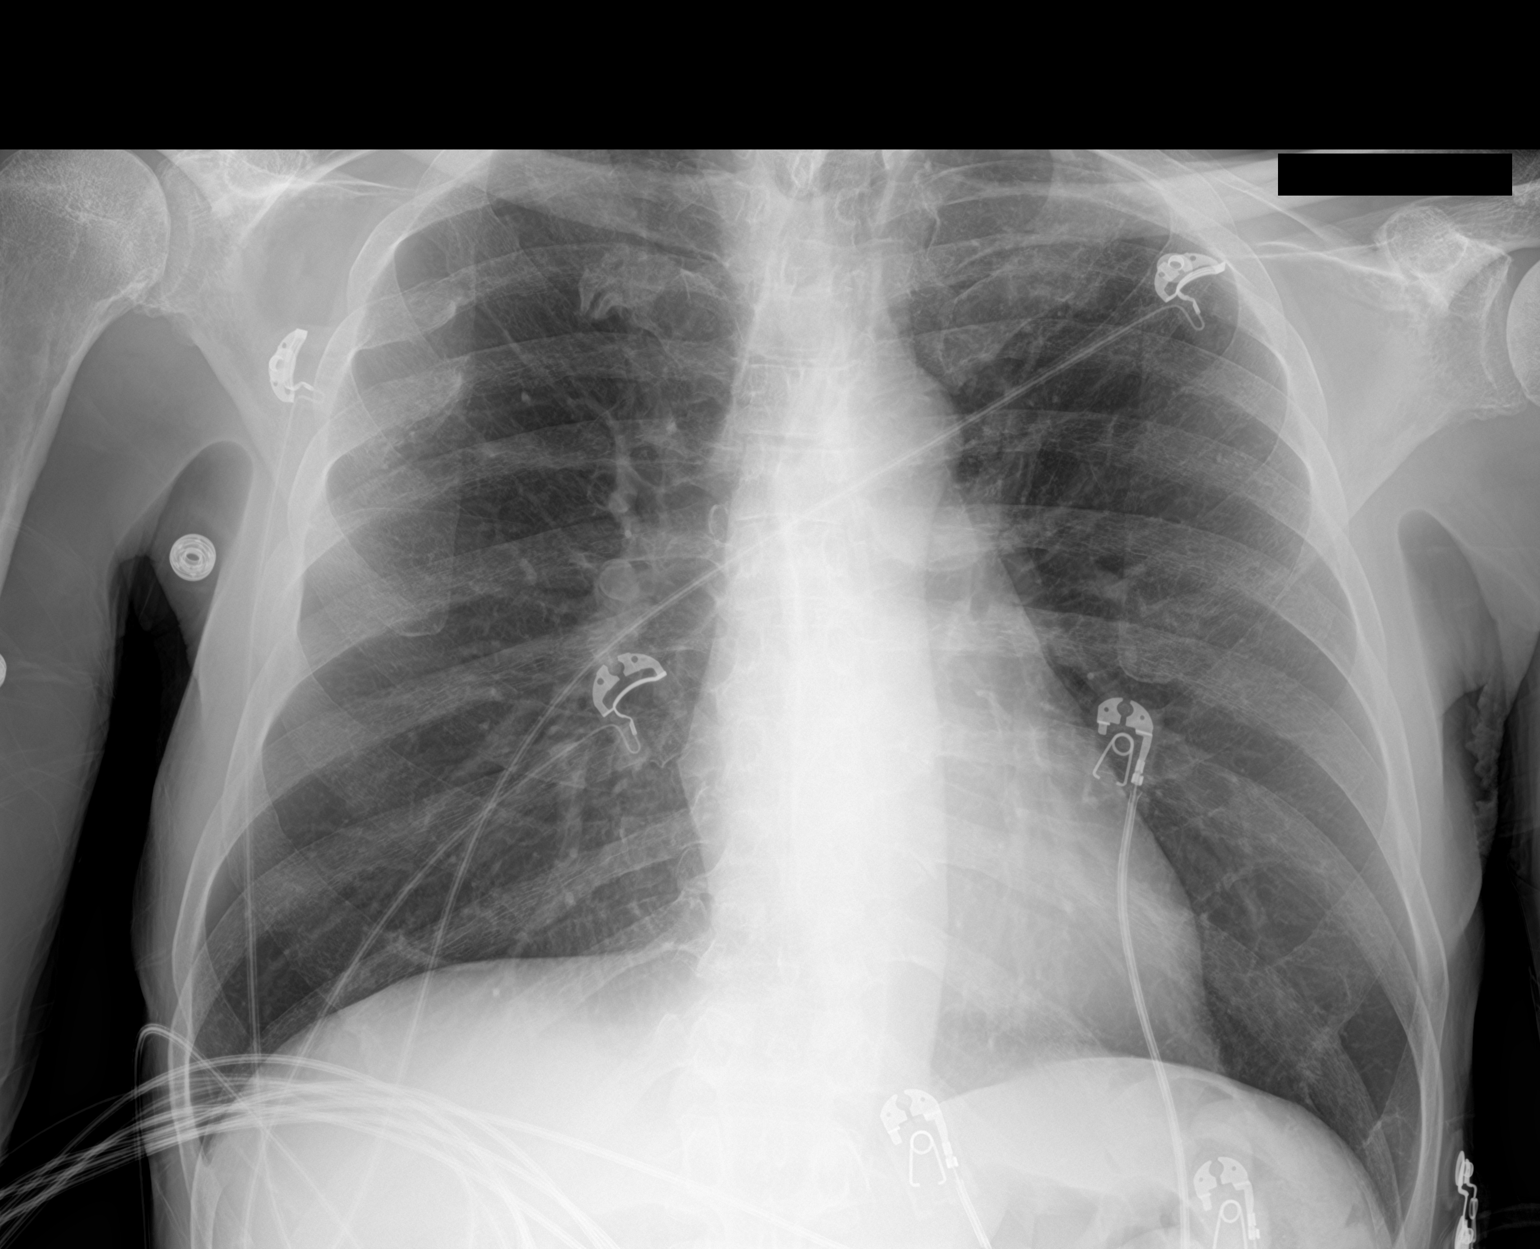

[2 of 2 positions shown; findings below may reference images not displayed]

FINDINGS: The cardiomediastinal silhouette is within normal limits. The lungs
are well inflated and clear. No pleural effusion or pneumothorax is
identified. Old right rib fractures are noted.
IMPRESSION: No active cardiopulmonary disease.

## 2019-07-24 IMAGING — DX DG ABD PORTABLE 1V
1 series · 1 of 1 positions shown · non-contrast
Comparison: None.

CLINICAL DATA: Ileus.  Periumbilical pain.

EXAM:
PORTABLE ABDOMEN - 1 VIEW

[abdomen kub]
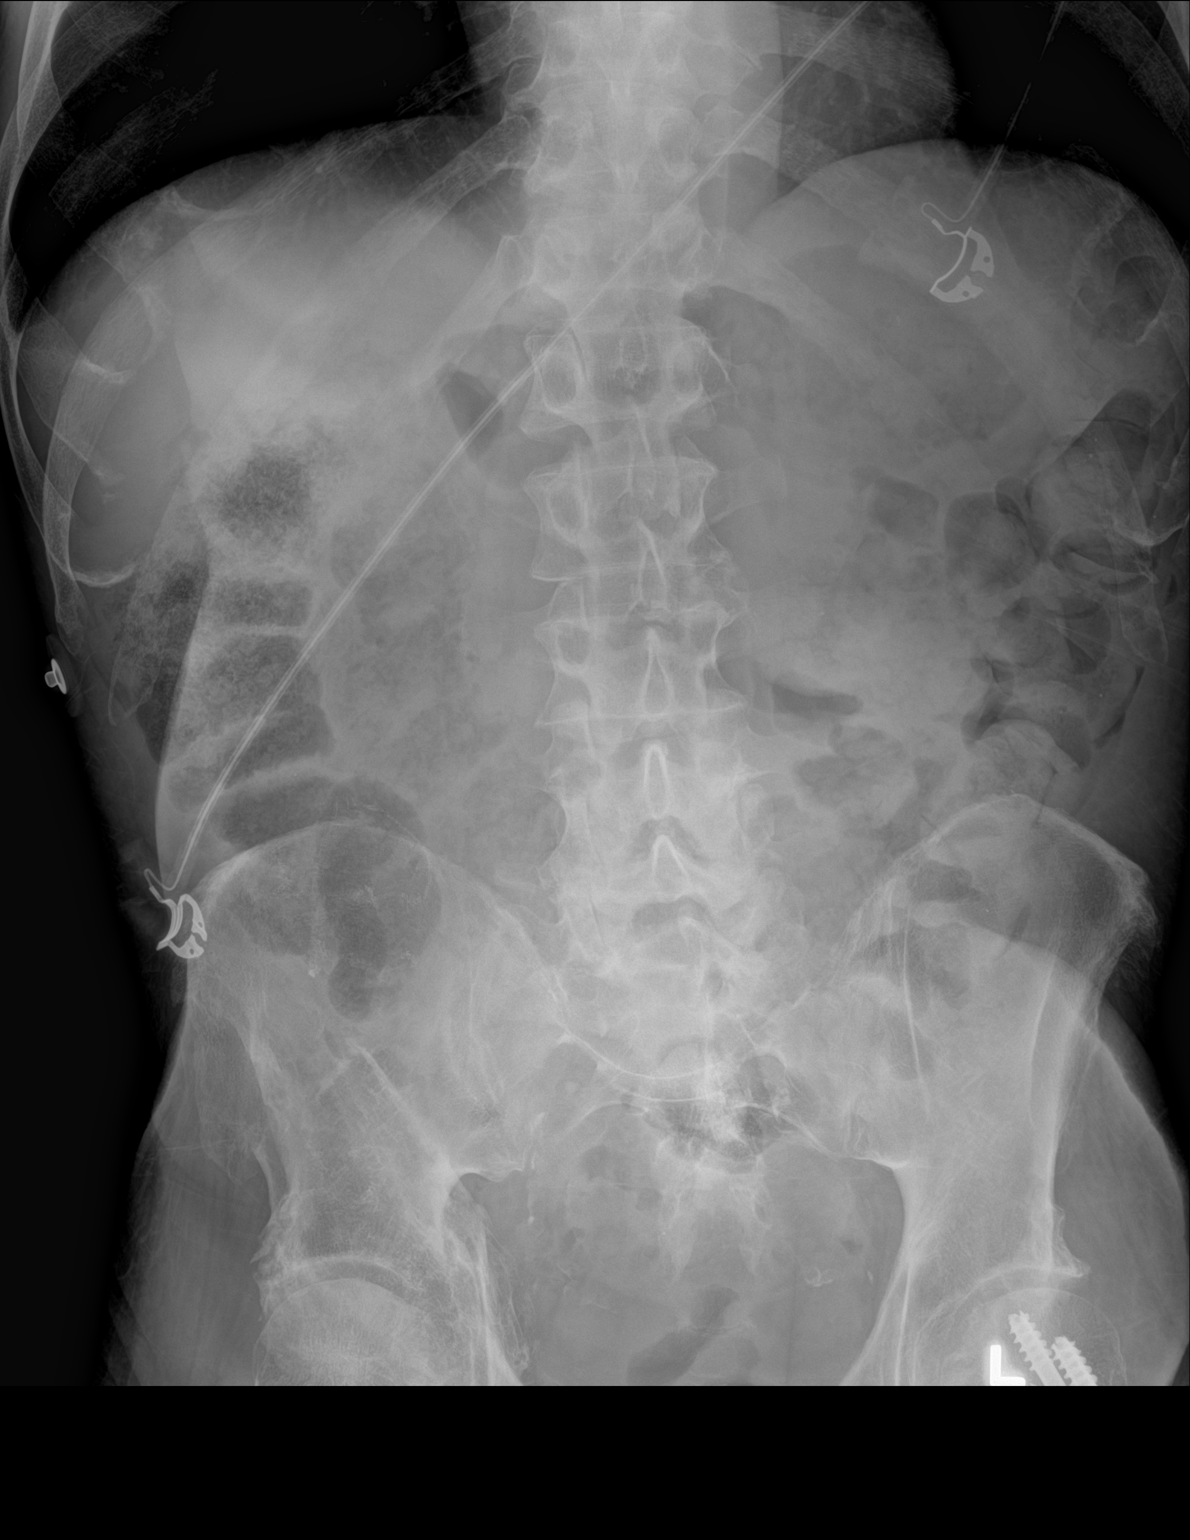

[1 of 1 positions shown; findings below may reference images not displayed]

FINDINGS: A large amount of fecal retention is seen within the colon. No small
bowel dilatation is noted. There is lower lumbar facet arthropathy
from L4 through S1. 3 cannulated screws traverse the left femoral
neck. No free air, organomegaly or radiopaque calculi.
IMPRESSION: Increased colonic stool burden consistent with constipation.

Lower lumbar facet arthropathy.

Three cannulated screws traverse a remote subcapital left femoral
neck fracture.

## 2019-08-12 ENCOUNTER — Ambulatory Visit: Payer: Commercial Managed Care - HMO | Admitting: Neurology
